# Patient Record
Sex: Female | Born: 1960 | Race: Black or African American | Hispanic: No | Smoking: Current every day smoker
Health system: Southern US, Community
[De-identification: ages and names within clinical notes are randomized; demographics above are authoritative.]

## PROBLEM LIST (undated history)

## (undated) DIAGNOSIS — G8190 Hemiplegia, unspecified affecting unspecified side: Secondary | ICD-10-CM

## (undated) DIAGNOSIS — F1490 Cocaine use, unspecified, uncomplicated: Secondary | ICD-10-CM

## (undated) DIAGNOSIS — I10 Essential (primary) hypertension: Secondary | ICD-10-CM

## (undated) DIAGNOSIS — E119 Type 2 diabetes mellitus without complications: Secondary | ICD-10-CM

## (undated) DIAGNOSIS — D696 Thrombocytopenia, unspecified: Secondary | ICD-10-CM

## (undated) DIAGNOSIS — Z720 Tobacco use: Secondary | ICD-10-CM

## (undated) DIAGNOSIS — I639 Cerebral infarction, unspecified: Secondary | ICD-10-CM

## (undated) DIAGNOSIS — I252 Old myocardial infarction: Secondary | ICD-10-CM

## (undated) DIAGNOSIS — I517 Cardiomegaly: Secondary | ICD-10-CM

## (undated) DIAGNOSIS — I82409 Acute embolism and thrombosis of unspecified deep veins of unspecified lower extremity: Secondary | ICD-10-CM

## (undated) HISTORY — PX: ABDOMINAL HYSTERECTOMY: SHX81

## (undated) HISTORY — PX: HERNIA REPAIR: SHX51

---

## 2001-12-15 ENCOUNTER — Emergency Department (HOSPITAL_COMMUNITY): Admission: EM | Admit: 2001-12-15 | Discharge: 2001-12-16 | Payer: Self-pay | Admitting: Emergency Medicine

## 2001-12-18 ENCOUNTER — Emergency Department (HOSPITAL_COMMUNITY): Admission: EM | Admit: 2001-12-18 | Discharge: 2001-12-18 | Payer: Self-pay | Admitting: Emergency Medicine

## 2002-01-08 ENCOUNTER — Other Ambulatory Visit: Admission: RE | Admit: 2002-01-08 | Discharge: 2002-01-08 | Payer: Self-pay | Admitting: Obstetrics and Gynecology

## 2002-01-20 ENCOUNTER — Encounter (HOSPITAL_COMMUNITY): Admission: RE | Admit: 2002-01-20 | Discharge: 2002-02-19 | Payer: Self-pay | Admitting: Oncology

## 2002-01-20 ENCOUNTER — Encounter: Admission: RE | Admit: 2002-01-20 | Discharge: 2002-01-20 | Payer: Self-pay | Admitting: Oncology

## 2002-01-21 ENCOUNTER — Ambulatory Visit (HOSPITAL_COMMUNITY): Admission: RE | Admit: 2002-01-21 | Discharge: 2002-01-21 | Payer: Self-pay | Admitting: Unknown Physician Specialty

## 2002-01-21 ENCOUNTER — Encounter: Payer: Self-pay | Admitting: Unknown Physician Specialty

## 2002-01-26 ENCOUNTER — Encounter: Payer: Self-pay | Admitting: Unknown Physician Specialty

## 2002-01-26 ENCOUNTER — Ambulatory Visit (HOSPITAL_COMMUNITY): Admission: RE | Admit: 2002-01-26 | Discharge: 2002-01-26 | Payer: Self-pay | Admitting: Unknown Physician Specialty

## 2002-02-17 ENCOUNTER — Other Ambulatory Visit: Admission: RE | Admit: 2002-02-17 | Discharge: 2002-02-17 | Payer: Self-pay | Admitting: Obstetrics & Gynecology

## 2002-03-08 ENCOUNTER — Inpatient Hospital Stay (HOSPITAL_COMMUNITY): Admission: RE | Admit: 2002-03-08 | Discharge: 2002-03-11 | Payer: Self-pay | Admitting: Obstetrics & Gynecology

## 2002-04-09 ENCOUNTER — Encounter: Admission: RE | Admit: 2002-04-09 | Discharge: 2002-04-09 | Payer: Self-pay | Admitting: Oncology

## 2002-07-23 ENCOUNTER — Encounter (HOSPITAL_COMMUNITY): Admission: RE | Admit: 2002-07-23 | Discharge: 2002-08-22 | Payer: Self-pay | Admitting: Oncology

## 2002-07-23 ENCOUNTER — Encounter: Admission: RE | Admit: 2002-07-23 | Discharge: 2002-07-23 | Payer: Self-pay | Admitting: Oncology

## 2004-04-24 ENCOUNTER — Emergency Department (HOSPITAL_COMMUNITY): Admission: EM | Admit: 2004-04-24 | Discharge: 2004-04-24 | Payer: Self-pay | Admitting: Emergency Medicine

## 2005-04-08 ENCOUNTER — Emergency Department (HOSPITAL_COMMUNITY): Admission: EM | Admit: 2005-04-08 | Discharge: 2005-04-08 | Payer: Self-pay | Admitting: Emergency Medicine

## 2005-06-19 ENCOUNTER — Emergency Department (HOSPITAL_COMMUNITY): Admission: EM | Admit: 2005-06-19 | Discharge: 2005-06-19 | Payer: Self-pay | Admitting: Emergency Medicine

## 2006-04-16 ENCOUNTER — Emergency Department (HOSPITAL_COMMUNITY): Admission: EM | Admit: 2006-04-16 | Discharge: 2006-04-17 | Payer: Self-pay | Admitting: Emergency Medicine

## 2007-05-08 ENCOUNTER — Emergency Department (HOSPITAL_COMMUNITY): Admission: EM | Admit: 2007-05-08 | Discharge: 2007-05-09 | Payer: Self-pay | Admitting: Emergency Medicine

## 2008-03-18 ENCOUNTER — Emergency Department (HOSPITAL_COMMUNITY): Admission: EM | Admit: 2008-03-18 | Discharge: 2008-03-18 | Payer: Self-pay | Admitting: Emergency Medicine

## 2011-01-20 ENCOUNTER — Encounter: Payer: Self-pay | Admitting: Internal Medicine

## 2011-05-17 NOTE — Discharge Summary (Signed)
[facility]  Patient:    Tracy [name], Tracy [name] Visit Number: [id number] MRN: [id number]          Service Type: SUR Location: 4A A427 01 Attending Physician:  Florian Buff Dictated by:   Darlis Loan, M.D. Admit Date:  03/08/2002 Discharge Date: 03/11/2002                             Discharge Summary  DISCHARGE DIAGNOSES: 1. Status post an abdominal hysterectomy. 2. Enlarged fibroid uterus. 3. Severe anemia. 4. Pancytopenia.  PROCEDURES: 1. Abdominal hysterectomy. 2. Transfusion of 2 units of packed red blood cells. 3. Hematology consult with Dr. Tressie Stalker.  HISTORY OF PRESENT ILLNESS:  Please refer to the transcribed history and physical for details of admission to the hospital.  HOSPITAL COURSE:  Patient was admitted and received 2 units of packed red blood cells in preparation for abdominal hysterectomy on the 11th.  The surgery went well, no complications.  Blood loss 200 cc.  She was still significantly thrombocytopenic and also had a white count of 3900 preoperatively.  As a result, got a consult with Dr. Tressie Stalker and he is going to follow her up in the office.  Postoperatively the patient has done great. She has ambulated without difficulty.  She has tolerated her low fiber, low residue, regular diet.  She is passing gas, tolerating oral pain medicine, and has been downstairs to smoke cigarettes three times.  She had a hemoglobin and hematocrit on postoperative day #1 of 9.4 and 28.4 and she was discharged to home on the morning of postoperative day #2 in good and stable condition to follow up in the office next Wednesday to have her staples removed and Steri-Strips placed.  She is given after care instructions and instructions for contacting our office if she has any problems.  She is scheduled to see Dr. Tressie Stalker in his office in three weeks. Dictated by:   Darlis Loan, M.D. Attending Physician:  Florian Buff DD:  03/11/02 TD:   03/12/02 Job: [id number]

## 2011-05-17 NOTE — Procedures (Signed)
[name], [name]         ACCOUNT NO.:  [id number]   MEDICAL RECORD NO.:  [id number]          PATIENT TYPE:  EMS   LOCATION:  ED                            FACILITY:  [facility]   PHYSICIAN:  Edward L. Luan Pulling, M.D.DATE OF BIRTH:  [birth year]   DATE OF PROCEDURE:  04/16/2006  DATE OF DISCHARGE:  04/17/2006                                EKG INTERPRETATION   TIME:  2223 hours  DATE:  April 16, 2006   The rhythm is supraventricular tachycardia with a ventricular response in  the 160s.  There is left axis deviation.  Q-waves are seen in V1 and V2  which may indicate a previous anteroseptal infarction.  Abnormal  electrocardiogram.      Jasper Loser. Luan Pulling, M.D.  Electronically Signed     ELH/MEDQ  D:  04/17/2006  T:  04/17/2006  Job:  [id number]

## 2011-05-17 NOTE — Procedures (Signed)
Tracy [name], Tracy [name]         ACCOUNT NO.:  [id number]   MEDICAL RECORD NO.:  [id number]          PATIENT TYPE:  EMS   LOCATION:  ED                            FACILITY:  [facility]   PHYSICIAN:  Edward L. Luan Pulling, M.D.DATE OF BIRTH:  10-Mar-1961   DATE OF PROCEDURE:  04/16/2006  DATE OF DISCHARGE:  04/17/2006                                EKG INTERPRETATION   TIME:  2223 hours  DATE:  April 16, 2006   The rhythm is supraventricular tachycardia with a rate of about 165.  There  is left axis deviation and some possible previous anteroseptal myocardial  infarction.  Abnormal electrocardiogram.      Jasper Loser. Luan Pulling, M.D.  Electronically Signed     ELH/MEDQ  D:  04/17/2006  T:  04/17/2006  Job:  [id number]

## 2011-05-17 NOTE — Op Note (Signed)
[facility]  Patient:    Tracy [name], Tracy [name] Visit Number: [id number] MRN: [id number]          Service Type: SUR Location: 4A A427 01 Attending Physician:  Florian Buff Dictated by:   Tania Ade, M.D. Admit Date:  03/08/2002                             Operative Report  PREOPERATIVE DIAGNOSES:  1. Enlarged fibroid uterus.  2. Menometrorrhagia. 3. Severe anemia.  4. Pancytopenia.  POSTOPERATIVE DIAGNOSIS:  1. Enlarged fibroid uterus.  2. Menometrorrhagia. 3. Severe anemia.  4. Pancytopenia.  OPERATION/PROCEDURE:  Abdominal hysterectomy, [id number]  SURGEON:  Tania Ade, M.D.  ASSISTANT:  Mallory Shirk, M.D.  ANESTHESIA:  General endotracheal anesthesia.  FINDINGS:  The patient had an enlarged fibroid uterus, approximately I would say 16 weeks size, multiple myelomas seen.  There were some filmy adhesions of both ovaries to the back wall of the uterus.  She had a follicular cyst of the right ovary.  The upper abdomen was evaluated and she had a normal looking liver and it felt normal.  Both kidneys were palpated, and she did not have a palpably enlarged spleen.  There were no other abnormalities seen.  DESCRIPTION OF PROCEDURE:  The patient received 1 gram of Ancef prior to surgery and she had flowtrons in place and working.  She had a platelet count of 68,000 last night, and I have her 2 units of blood and her H&H has come up to 10 and 29. We wanted to check another platelet this morning and it was 60,000.  As a result we proceeded with surgery.  The patient was taken to the operating room and placed in the supine position where she underwent general endotracheal anesthesia.  She was prepped and draped in the usual sterile fashion, including the vagina, and a Foley catheter was placed.  A Pfannenstiel skin incision was made and carried down sharply to the rectus fascia which was scored in the midline and extended laterally.  The fascia was  taken off the muscles both superiorly and inferiorly without difficulty.  The muscles were divided and the peritoneal cavity was entered.  The uterus was liberated from the pelvis and a retractor was not used.  The left round ligament was isolated, suture ligated, and cut with electrocautery unit and the vesicouterine serosal flap was created on the left.  The right round ligament was suture ligated and cut, and the vesicouterine serosal flap on the right was created.  The utero-ovarian ligament on the right was isolated and an avascular window made; and clamped, cut, and suture ligated.  The utero-ovarian ligament on the left was isolated and an avascular window made.  It was clamped, cut, and double-suture ligated.  The uterine vessels were skeletonized on the left and the bladder was pushed off the lower uterine segment without difficulty.  The left uterine vessels were clamped, cut, and suture ligated.  The right uterine vessels were then skeletonized and clamped, cut, and suture ligated.  Serial pedicles were taken down the cervix, through the cardinal ligament with each pedicle being clamped, cut, transfixed, and suture ligated and cut.  We then got to the end of the cervix and the vagina was incised.  The cervix was pulled up through the vaginal incision; and the specimen was removed sharply with scissors.  Vaginal angle sutures were then placed and the cuff was closed in anterior-to-posterior  fashion with interrupted sutures.  All sutures used, at this point, was #0 Vicryl.  There was some bleeding of the left utero-ovarian pedicle and this pedicle was reinforced with figure-of-eight suture.  Both ovaries were then attached to the round ligaments to get them away from the vagina so that they would not become adherent and cause dyspareunia later.  Vigorous pelvic irrigation was performed and small peritoneal edge bleeders were taken care of with electrocautery unit.  The upper  abdomen was inspected, as I stated, mostly because of the patients pancytopenia.  The liver was normal.  Gallbladder was normal.  Both kidneys palpated normal.  Bowels are normal and the spleen palpated normal.  Again, hemostasis was confirmed and the vaginal angle suture were tied together in the midline.  The rectus muscles were reapproximated loosely.  The fascia was closed using #0 Vicryl running.  The subcutaneous tissue was made hemostatic and irrigated.  The skin was closed using skin staples.  An Alpha 200 pain pump 2 cc/h of 1/2% Marcaine was placed.  The patient tolerated the procedure well.  She experienced 200 cc of blood loss.  She was taken to the recovery room in good stable condition.  All needle, sponge, and instrument counts were correct x3.  She had 650 cc of clear urine output and 2400 cc of fluid intraoperatively.  After surgery, I called Dr. Tressie Stalker due to her pancytopenia and consulted him regarding that. Dictated by:   Tania Ade, M.D. Attending Physician:  Florian Buff DD:  03/09/02 TD:  03/09/02 Job: [id number]

## 2011-05-17 NOTE — Procedures (Signed)
[name], [name]         ACCOUNT NO.:  [id number]   MEDICAL RECORD NO.:  [id number]          PATIENT TYPE:  EMS   LOCATION:  ED                            FACILITY:  [facility]   PHYSICIAN:  Edward L. Luan Pulling, M.D.DATE OF BIRTH:  September 29, 1961   DATE OF PROCEDURE:  04/16/2006  DATE OF DISCHARGE:  04/17/2006                                EKG INTERPRETATION   TIME:  2240 hours  DATE:  April 16, 2006   The rhythm is a supraventricular tachycardia but I cannot tell if there are  P-waves, it is listed as being EKG #2 post adenosine, the heart rate has  slowed somewhat to 140, the axis is leftward, there are Q-waves in V1 and V2  suggesting previous infarction, QT interval is now prolonged.  Abnormal  electrocardiogram.      Jasper Loser. Luan Pulling, M.D.  Electronically Signed     ELH/MEDQ  D:  04/17/2006  T:  04/17/2006  Job:  [id number]

## 2011-09-23 LAB — DIFFERENTIAL
Basophils Relative: 0
Lymphocytes Relative: 23
Lymphs Abs: 1.4
Monocytes Absolute: 0.3
Monocytes Relative: 6
Neutro Abs: 4.1
Neutrophils Relative %: 70

## 2011-09-23 LAB — CBC
Hemoglobin: 15.1 — ABNORMAL HIGH
MCHC: 35.2
RBC: 4.64
WBC: 5.8

## 2011-09-23 LAB — BASIC METABOLIC PANEL
CO2: 26
Calcium: 8.8
GFR calc Af Amer: >60
Sodium: 138

## 2012-04-19 ENCOUNTER — Encounter (HOSPITAL_COMMUNITY): Payer: Self-pay

## 2012-04-19 ENCOUNTER — Emergency Department (HOSPITAL_COMMUNITY)
Admission: EM | Admit: 2012-04-19 | Discharge: 2012-04-19 | Disposition: A | Discharge status: Home / Self Care | Payer: Self-pay | Attending: Emergency Medicine | Admitting: Emergency Medicine

## 2012-04-19 DIAGNOSIS — I10 Essential (primary) hypertension: Secondary | ICD-10-CM | POA: Insufficient documentation

## 2012-04-19 DIAGNOSIS — Z760 Encounter for issue of repeat prescription: Secondary | ICD-10-CM | POA: Insufficient documentation

## 2012-04-19 DIAGNOSIS — H60399 Other infective otitis externa, unspecified ear: Secondary | ICD-10-CM | POA: Insufficient documentation

## 2012-04-19 DIAGNOSIS — L0291 Cutaneous abscess, unspecified: Secondary | ICD-10-CM

## 2012-04-19 DIAGNOSIS — I252 Old myocardial infarction: Secondary | ICD-10-CM | POA: Insufficient documentation

## 2012-04-19 HISTORY — DX: Essential (primary) hypertension: I10

## 2012-04-19 HISTORY — DX: Old myocardial infarction: I25.2

## 2012-04-19 MED ORDER — HYDROCODONE-ACETAMINOPHEN 5-325 MG PO TABS
1.0000 | ORAL_TABLET | Freq: Once | ORAL | Status: AC
  Administered 2012-04-19: 1 via ORAL
  Filled 2012-04-19: qty 1

## 2012-04-19 MED ORDER — LIDOCAINE HCL (PF) 2 % IJ SOLN
INTRAMUSCULAR | Status: AC
  Administered 2012-04-19: 10:00:00
  Filled 2012-04-19: qty 10

## 2012-04-19 MED ORDER — HYDROCHLOROTHIAZIDE 25 MG PO TABS: 25.0000 mg | ORAL_TABLET | Freq: Every day | ORAL | Status: DC

## 2012-04-19 MED ORDER — SULFAMETHOXAZOLE-TRIMETHOPRIM 800-160 MG PO TABS: 1.0000 | ORAL_TABLET | Freq: Two times a day (BID) | ORAL | Status: AC

## 2012-04-19 MED ORDER — HYDROCODONE-ACETAMINOPHEN 5-325 MG PO TABS: 1.0000 | ORAL_TABLET | ORAL | Status: AC | PRN

## 2012-04-19 MED ORDER — METOPROLOL SUCCINATE ER 100 MG PO TB24: 100.0000 mg | ORAL_TABLET | Freq: Every day | ORAL | Status: DC

## 2012-04-19 NOTE — ED Notes (Signed)
EDP is in with the pt at this time

## 2012-04-19 NOTE — ED Notes (Signed)
?  abcess behind right ear x1 week

## 2012-04-19 NOTE — ED Notes (Signed)
Has been on 3 bp meds in the past, but has been out "for a long while"

## 2012-04-19 NOTE — ED Notes (Signed)
Pt states she is in more pain now than before. eDP aware.

## 2012-04-19 NOTE — Discharge Instructions (Signed)
Return to the ER in two days to have the packing removed. Take all of the antibiotic. Use the pain medicine as needed. Take the blood pressure medicine. Follow up at the [facility]   Abscess Care After An abscess (also called a boil or furuncle) is an infected area that contains a collection of pus. Signs and symptoms of an abscess include pain, tenderness, redness, or hardness, or you may feel a moveable soft area under your skin. An abscess can occur anywhere in the body. The infection may spread to surrounding tissues causing cellulitis. A cut (incision) by the surgeon was made over your abscess and the pus was drained out. Gauze may have been packed into the space to provide a drain that will allow the cavity to heal from the inside outwards. The boil may be painful for 5 to 7 days. Most people with a boil do not have high fevers. Your abscess, if seen early, may not have localized, and may not have been lanced. If not, another appointment may be required for this if it does not get better on its own or with medications. HOME CARE INSTRUCTIONS   Only take over-the-counter or prescription medicines for pain, discomfort, or fever as directed by your caregiver.   When you bathe, soak and then remove gauze or iodoform packs at least daily or as directed by your caregiver. You may then wash the wound gently with mild soapy water. Repack with gauze or do as your caregiver directs.  SEEK IMMEDIATE MEDICAL CARE IF:   You develop increased pain, swelling, redness, drainage, or bleeding in the wound site.   You develop signs of generalized infection including muscle aches, chills, fever, or a general ill feeling.   An oral temperature above 102 F (38.9 C) develops, not controlled by medication.  See your caregiver for a recheck if you develop any of the symptoms described above. If medications (antibiotics) were prescribed, take them as directed. Document Released: 07/04/2005 Document Revised:  12/05/2011 Document Reviewed: 02/29/2008 [facility] Patient Information 2012 [place].

## 2012-04-19 NOTE — ED Provider Notes (Signed)
History   This chart was scribed for [employer]. Olin Hauser, MD by Carolyne Littles. The patient was seen in room APA07/APA07. Patient's care was started at 0816.    CSN: [id number]  Arrival date & time 04/19/12  [id number]   First MD Initiated Contact with Patient 04/19/12 [phone]      Chief Complaint  Patient presents with  . Wound Check  . Medication Refill    (Consider location/radiation/quality/duration/timing/severity/associated sxs/prior treatment) HPI Tracy [name] is a 51 y.o. female who presents to the Emergency Department complaining of a moderate abscess behind the right ear onset 1 week ago and gradually worsening since with associated throbbing pain to region. Denies fever, chills, nausea, vomiting, HA. Patient with h/o HTN, MI and is a current everyday smoker.     Past Medical History  Diagnosis Date  . Hypertension   . Myocardial infarct, old     Past Surgical History  Procedure Date  . Abdominal hysterectomy     No family history on file.  History  Substance Use Topics  . Smoking status: Current Everyday Smoker    Types: Cigarettes  . Smokeless tobacco: Not on file  . Alcohol Use: No    OB History    Grav Para Term Preterm Abortions TAB SAB Ect Mult Living                  Review of Systems A complete 10 system review of systems was obtained and all systems are negative except as noted in the HPI and PMH.   Allergies  Review of patient's allergies indicates no known allergies.  Home Medications  No current outpatient prescriptions on file.  BP 167/113  Pulse 82  Temp(Src) 98.9 F (37.2 C) (Oral)  Resp 20  Ht 5\' 7"  (1.702 m)  Wt 216 lb 1 oz (98.005 kg)  BMI 33.84 kg/m2  SpO2 96%  Physical Exam  Nursing note and vitals reviewed. Constitutional: She is oriented to person, place, and time. She appears well-developed and well-nourished. No distress.  HENT:  Head: Normocephalic and atraumatic.       2cm abscess overlying right mastoid process  with obvious pus and fluctuance. 65mm abscess noted below right ear with an abraded top and no fluctuance.  Eyes: EOM are normal. Pupils are equal, round, and reactive to light.  Neck: Neck supple. No tracheal deviation present.  Cardiovascular: An irregular rhythm present. Bradycardia present.  Exam reveals no gallop and no friction rub.   No murmur heard.      Regular rhythm with frequent ectopy.   Pulmonary/Chest: Effort normal. No respiratory distress.  Abdominal: Soft. She exhibits no distension.  Musculoskeletal: Normal range of motion. She exhibits no edema.  Lymphadenopathy:    She has cervical adenopathy (shoddy).  Neurological: She is alert and oriented to person, place, and time. No sensory deficit.  Skin: Skin is warm and dry.  Psychiatric: She has a normal mood and affect. Her behavior is normal.    ED Course  Procedures   INCISION AND DRAINAGE Performed by: NEESE,HOPE Consent: Verbal consent obtained. Risks and benefits: risks, benefits and alternatives were discussed Type: abscess  Body area: post auricular Cleaned with betadine  Anesthesia: local infiltration  Local anesthetic: lidocaine 1%  Anesthetic total: 1 ml  Complexity: complex Incision and drainage using #11 blade  Blunt dissection to break up loculations  Drainage: purulent  Drainage amount: moderate Irrigated with normal saline solution Packing material: 1/4 in iodoform gauze  Patient tolerance: Patient tolerated  the procedure well with no immediate complications.   DIAGNOSTIC STUDIES: Oxygen Saturation is 96% on room air, adequate by my interpretation.    COORDINATION OF CARE: 8:51AM-Patient informed of current plan for treatment and evaluation and agrees with plan at this time.  10:39AM- Patient reports she is feeling better at this time following I and D procedure. Patient explained need for care of HTN. Will d/c home. Patient agrees.       MDM  Patient with abscess behind right  ear and below right ear. I&D completed by midlevel. Initiated antibiotic therapy.Patient with hypertension, out of medication for some time. Will provide Rx for her antihypertensives.Pt stable in ED with no significant deterioration in condition.The patient appears reasonably screened and/or stabilized for discharge and I doubt any other medical condition or other [facility] requiring further screening, evaluation, or treatment in the ED at this time prior to discharge.  I personally performed the services described in this documentation, which was scribed in my presence. The recorded information has been reviewed and considered.   MDM Reviewed: nursing note and vitals           Gypsy Balsam. Olin Hauser, MD 04/19/12 1106

## 2012-04-21 ENCOUNTER — Emergency Department (HOSPITAL_COMMUNITY)
Admission: EM | Admit: 2012-04-21 | Discharge: 2012-04-21 | Disposition: A | Discharge status: Home / Self Care | Payer: Self-pay | Attending: Emergency Medicine | Admitting: Emergency Medicine

## 2012-04-21 ENCOUNTER — Encounter (HOSPITAL_COMMUNITY): Payer: Self-pay | Admitting: *Deleted

## 2012-04-21 DIAGNOSIS — K047 Periapical abscess without sinus: Secondary | ICD-10-CM | POA: Insufficient documentation

## 2012-04-21 DIAGNOSIS — I252 Old myocardial infarction: Secondary | ICD-10-CM | POA: Insufficient documentation

## 2012-04-21 DIAGNOSIS — F17200 Nicotine dependence, unspecified, uncomplicated: Secondary | ICD-10-CM | POA: Insufficient documentation

## 2012-04-21 DIAGNOSIS — L0291 Cutaneous abscess, unspecified: Secondary | ICD-10-CM

## 2012-04-21 DIAGNOSIS — I10 Essential (primary) hypertension: Secondary | ICD-10-CM | POA: Insufficient documentation

## 2012-04-21 MED ORDER — OXYCODONE-ACETAMINOPHEN 5-325 MG PO TABS: 1.0000 | ORAL_TABLET | ORAL | Status: AC | PRN

## 2012-04-21 NOTE — ED Provider Notes (Signed)
History     CSN: [id number]  Arrival date & time 04/21/12  1544   First MD Initiated Contact with Patient 04/21/12 1613      Chief Complaint  Patient presents with  . Wound Check    (Consider location/radiation/quality/duration/timing/severity/associated sxs/prior treatment) HPI Comments: Patient presents for evaluation of an abscess she had a pain deep 2 days ago behind her right ear at her scalp line.  She is without complaint today, she does have soreness at the site but is improved since it has been drained.  She changed the dressing for the first time this morning and there was moderate drainage but none since.  She denies fevers or chills, no surrounding redness or swelling.  Patient is a 51 y.o. female presenting with wound check.  Wound Check     Past Medical History  Diagnosis Date  . Hypertension   . Myocardial infarct, old     Past Surgical History  Procedure Date  . Abdominal hysterectomy     History reviewed. No pertinent family history.  History  Substance Use Topics  . Smoking status: Current Everyday Smoker    Types: Cigarettes  . Smokeless tobacco: Not on file  . Alcohol Use: No    OB History    Grav Para Term Preterm Abortions TAB SAB Ect Mult Living                  Review of Systems  Skin: Positive for wound.  All other systems reviewed and are negative.    Allergies  Review of patient's allergies indicates no known allergies.  Home Medications   Current Outpatient Rx  Name Route Sig Dispense Refill  . HYDROCHLOROTHIAZIDE 25 MG PO TABS Oral Take 1 tablet (25 mg total) by mouth daily. 30 tablet 0  . HYDROCODONE-ACETAMINOPHEN 5-325 MG PO TABS Oral Take 1 tablet by mouth every 4 (four) hours as needed for pain. 15 tablet 0  . SULFAMETHOXAZOLE-TRIMETHOPRIM 800-160 MG PO TABS Oral Take 1 tablet by mouth 2 (two) times daily. 28 tablet 0  . METOPROLOL SUCCINATE ER 100 MG PO TB24 Oral Take 1 tablet (100 mg total) by mouth daily. 30 tablet 0     BP 166/100  Pulse 84  Temp(Src) 97.7 F (36.5 C) (Oral)  Resp 16  Ht 5\' 6"  (1.676 m)  Wt 216 lb 1 oz (98.005 kg)  BMI 34.87 kg/m2  SpO2 97%  Physical Exam  Constitutional: She is oriented to person, place, and time. She appears well-developed and well-nourished. No distress.  HENT:  Head: Normocephalic and atraumatic.  Right Ear: External ear normal.  Left Ear: External ear normal.  Mouth/Throat: Oropharynx is clear and moist and mucous membranes are normal. No oral lesions. Dental abscesses present.  Eyes: Conjunctivae are normal.  Neck: Normal range of motion. Neck supple.  Cardiovascular: Normal rate and normal heart sounds.   Pulmonary/Chest: Effort normal.  Abdominal: She exhibits no distension.  Musculoskeletal: Normal range of motion.  Lymphadenopathy:    She has cervical adenopathy.       Right cervical: Superficial cervical adenopathy present.  Neurological: She is alert and oriented to person, place, and time.  Skin: Skin is warm and dry. No erythema.       2 cm abscess at right mastoid process with a small amount of purulence within the abscess site.  The area was flushed clean revealing a healthy granulation tissue formation.  Area was not repacked.  Bandage was applied.  Psychiatric: She has a  normal mood and affect.    ED Course  Procedures (including critical care time)  Labs Reviewed - No data to display No results found.   1. Abscess       MDM  Patient encouraged to start warm shower water therapy to the areas daily.  Complete antibiotic course.  Return when necessary if she has any problems or concerns.        Fulton Reek, Victoria 04/21/12 1720

## 2012-04-21 NOTE — Discharge Instructions (Signed)
Abscess An abscess (boil or furuncle) is an infected area that contains a collection of pus.  SYMPTOMS Signs and symptoms of an abscess include pain, tenderness, redness, or hardness. You may feel a moveable soft area under your skin. An abscess can occur anywhere in the body.  TREATMENT  A surgical cut (incision) may be made over your abscess to drain the pus. Gauze may be packed into the space or a drain may be looped through the abscess cavity (pocket). This provides a drain that will allow the cavity to heal from the inside outwards. The abscess may be painful for a few days, but should feel much better if it was drained.  Your abscess, if seen early, may not have localized and may not have been drained. If not, another appointment may be required if it does not get better on its own or with medications. HOME CARE INSTRUCTIONS   Only take over-the-counter or prescription medicines for pain, discomfort, or fever as directed by your caregiver.   Take your antibiotics as directed if they were prescribed. Finish them even if you start to feel better.   Keep the skin and clothes clean around your abscess.   If the abscess was drained, you will need to use gauze dressing to collect any draining pus. Dressings will typically need to be changed 3 or more times a day.   The infection may spread by skin contact with others. Avoid skin contact as much as possible.   Practice good hygiene. This includes regular hand washing, cover any draining skin lesions, and do not share personal care items.   If you participate in sports, do not share athletic equipment, towels, whirlpools, or personal care items. Shower after every practice or tournament.   If a draining area cannot be adequately covered:   Do not participate in sports.   Children should not participate in day care until the wound has healed or drainage stops.   If your caregiver has given you a follow-up appointment, it is very important  to keep that appointment. Not keeping the appointment could result in a much worse infection, chronic or permanent injury, pain, and disability. If there is any problem keeping the appointment, you must call back to this facility for assistance.  SEEK MEDICAL CARE IF:   You develop increased pain, swelling, redness, drainage, or bleeding in the wound site.   You develop signs of generalized infection including muscle aches, chills, fever, or a general ill feeling.   You have an oral temperature above 102 F (38.9 C).  MAKE SURE YOU:   Understand these instructions.   Will watch your condition.   Will get help right away if you are not doing well or get worse.  Document Released: 09/25/2005 Document Revised: 12/05/2011 Document Reviewed: 07/19/2008 [facility] Patient Information 2012 [place].   Finish your antibiotics.  Start doing a gentle water therapy twice daily by allowing your shower water to massage this area for 5 minutes twice daily.  Keep covered until it stops draining and it is sealed over.

## 2012-04-21 NOTE — ED Notes (Signed)
Pt needs wound check to right side of her head. Had I&D done on Sunday and has packing in place.

## 2012-04-25 NOTE — ED Provider Notes (Signed)
Medical screening examination/treatment/procedure(s) were performed by non-physician practitioner and as supervising physician I was immediately available for consultation/collaboration.   Mervin Kung, MD 04/25/12 [phone]

## 2012-07-03 ENCOUNTER — Inpatient Hospital Stay (HOSPITAL_COMMUNITY): Payer: Medicaid Other

## 2012-07-03 ENCOUNTER — Encounter (HOSPITAL_COMMUNITY): Payer: Self-pay | Admitting: *Deleted

## 2012-07-03 ENCOUNTER — Emergency Department (HOSPITAL_COMMUNITY): Payer: Medicaid Other

## 2012-07-03 ENCOUNTER — Inpatient Hospital Stay (HOSPITAL_COMMUNITY)
Admission: EM | Admit: 2012-07-03 | Discharge: 2012-07-04 | DRG: 065 | Disposition: A | Discharge status: Home Health | Payer: Medicaid Other | Attending: Internal Medicine | Admitting: Internal Medicine

## 2012-07-03 DIAGNOSIS — D6959 Other secondary thrombocytopenia: Secondary | ICD-10-CM | POA: Diagnosis present

## 2012-07-03 DIAGNOSIS — F1490 Cocaine use, unspecified, uncomplicated: Secondary | ICD-10-CM | POA: Diagnosis present

## 2012-07-03 DIAGNOSIS — Z91199 Patient's noncompliance with other medical treatment and regimen due to unspecified reason: Secondary | ICD-10-CM

## 2012-07-03 DIAGNOSIS — Z720 Tobacco use: Secondary | ICD-10-CM | POA: Diagnosis present

## 2012-07-03 DIAGNOSIS — F17200 Nicotine dependence, unspecified, uncomplicated: Secondary | ICD-10-CM | POA: Diagnosis present

## 2012-07-03 DIAGNOSIS — Z90710 Acquired absence of both cervix and uterus: Secondary | ICD-10-CM

## 2012-07-03 DIAGNOSIS — I160 Hypertensive urgency: Secondary | ICD-10-CM | POA: Diagnosis present

## 2012-07-03 DIAGNOSIS — Z823 Family history of stroke: Secondary | ICD-10-CM

## 2012-07-03 DIAGNOSIS — D696 Thrombocytopenia, unspecified: Secondary | ICD-10-CM | POA: Diagnosis present

## 2012-07-03 DIAGNOSIS — I252 Old myocardial infarction: Secondary | ICD-10-CM

## 2012-07-03 DIAGNOSIS — I639 Cerebral infarction, unspecified: Secondary | ICD-10-CM | POA: Diagnosis present

## 2012-07-03 DIAGNOSIS — D751 Secondary polycythemia: Secondary | ICD-10-CM | POA: Diagnosis present

## 2012-07-03 DIAGNOSIS — I517 Cardiomegaly: Secondary | ICD-10-CM | POA: Diagnosis present

## 2012-07-03 DIAGNOSIS — Z8673 Personal history of transient ischemic attack (TIA), and cerebral infarction without residual deficits: Secondary | ICD-10-CM

## 2012-07-03 DIAGNOSIS — F1410 Cocaine abuse, uncomplicated: Secondary | ICD-10-CM

## 2012-07-03 DIAGNOSIS — F40298 Other specified phobia: Secondary | ICD-10-CM | POA: Diagnosis present

## 2012-07-03 DIAGNOSIS — G8190 Hemiplegia, unspecified affecting unspecified side: Secondary | ICD-10-CM | POA: Diagnosis present

## 2012-07-03 DIAGNOSIS — I10 Essential (primary) hypertension: Secondary | ICD-10-CM

## 2012-07-03 DIAGNOSIS — Z9119 Patient's noncompliance with other medical treatment and regimen: Secondary | ICD-10-CM

## 2012-07-03 DIAGNOSIS — I6350 Cerebral infarction due to unspecified occlusion or stenosis of unspecified cerebral artery: Principal | ICD-10-CM

## 2012-07-03 HISTORY — DX: Tobacco use: Z72.0

## 2012-07-03 HISTORY — DX: Cerebral infarction, unspecified: I63.9

## 2012-07-03 HISTORY — DX: Cocaine use, unspecified, uncomplicated: F14.90

## 2012-07-03 HISTORY — DX: Thrombocytopenia, unspecified: D69.6

## 2012-07-03 HISTORY — DX: Hemiplegia, unspecified affecting unspecified side: G81.90

## 2012-07-03 HISTORY — DX: Cardiomegaly: I51.7

## 2012-07-03 LAB — RAPID URINE DRUG SCREEN, HOSP PERFORMED
Amphetamines: NOT DETECTED
Barbiturates: NOT DETECTED
Cocaine: POSITIVE — AB
Opiates: NOT DETECTED
Tetrahydrocannabinol: NOT DETECTED

## 2012-07-03 LAB — CBC
HCT: 46.6 % — ABNORMAL HIGH (ref 36.0–46.0)
Hemoglobin: 16.2 g/dL — ABNORMAL HIGH (ref 12.0–15.0)
RBC: 5.15 MIL/uL — ABNORMAL HIGH (ref 3.87–5.11)
WBC: 7 10*3/uL (ref 4.0–10.5)

## 2012-07-03 LAB — PROTIME-INR: INR: 1.03 (ref 0.00–1.49)

## 2012-07-03 LAB — DIFFERENTIAL
Basophils Relative: 0 % (ref 0–1)
Lymphocytes Relative: 31 % (ref 12–46)
Lymphs Abs: 2.2 10*3/uL (ref 0.7–4.0)
Monocytes Absolute: 0.3 10*3/uL (ref 0.1–1.0)
Monocytes Relative: 4 % (ref 3–12)
Neutro Abs: 4.5 10*3/uL (ref 1.7–7.7)
Neutrophils Relative %: 64 % (ref 43–77)

## 2012-07-03 LAB — CK TOTAL AND CKMB (NOT AT ARMC)
CK, MB: 2.8 ng/mL (ref 0.3–4.0)
Total CK: 117 U/L (ref 7–177)

## 2012-07-03 LAB — COMPREHENSIVE METABOLIC PANEL
Albumin: 3.5 g/dL (ref 3.5–5.2)
Alkaline Phosphatase: 107 U/L (ref 39–117)
BUN: 13 mg/dL (ref 6–23)
CO2: 25 mEq/L (ref 19–32)
Chloride: 106 mEq/L (ref 96–112)
Creatinine, Ser: 0.99 mg/dL (ref 0.50–1.10)
GFR calc non Af Amer: 65 mL/min — ABNORMAL LOW (ref >90)
Potassium: 3.6 mEq/L (ref 3.5–5.1)
Total Bilirubin: 0.5 mg/dL (ref 0.3–1.2)

## 2012-07-03 LAB — GLUCOSE, CAPILLARY: Glucose-Capillary: 108 mg/dL — ABNORMAL HIGH (ref 70–99)

## 2012-07-03 LAB — APTT: aPTT: 26 seconds (ref 24–37)

## 2012-07-03 MED ORDER — SENNOSIDES-DOCUSATE SODIUM 8.6-50 MG PO TABS: 1.0000 | ORAL_TABLET | Freq: Every evening | ORAL | Status: DC | PRN

## 2012-07-03 MED ORDER — ASPIRIN 325 MG PO TABS
325.0000 mg | ORAL_TABLET | Freq: Every day | ORAL | Status: DC
  Administered 2012-07-04: 325 mg via ORAL
  Filled 2012-07-03: qty 1

## 2012-07-03 MED ORDER — NICOTINE 21 MG/24HR TD PT24
21.0000 mg | MEDICATED_PATCH | Freq: Every day | TRANSDERMAL | Status: DC
  Administered 2012-07-03 – 2012-07-04 (×2): 21 mg via TRANSDERMAL
  Filled 2012-07-03 (×2): qty 1

## 2012-07-03 MED ORDER — ASPIRIN 81 MG PO CHEW
324.0000 mg | CHEWABLE_TABLET | Freq: Once | ORAL | Status: DC
  Filled 2012-07-03 (×2): qty 4

## 2012-07-03 MED ORDER — HYDROMORPHONE HCL PF 1 MG/ML IJ SOLN
1.0000 mg | Freq: Once | INTRAMUSCULAR | Status: AC
  Administered 2012-07-03: 1 mg via INTRAVENOUS
  Filled 2012-07-03: qty 1

## 2012-07-03 MED ORDER — ACETAMINOPHEN 325 MG PO TABS
650.0000 mg | ORAL_TABLET | Freq: Four times a day (QID) | ORAL | Status: DC | PRN
  Administered 2012-07-03 – 2012-07-04 (×2): 650 mg via ORAL
  Filled 2012-07-03 (×3): qty 2

## 2012-07-03 MED ORDER — VITAMIN B-1 100 MG PO TABS
100.0000 mg | ORAL_TABLET | Freq: Every day | ORAL | Status: DC
  Administered 2012-07-03 – 2012-07-04 (×2): 100 mg via ORAL
  Filled 2012-07-03 (×2): qty 1

## 2012-07-03 MED ORDER — LABETALOL HCL 5 MG/ML IV SOLN
10.0000 mg | INTRAVENOUS | Status: DC | PRN
  Administered 2012-07-03: 10 mg via INTRAVENOUS
  Filled 2012-07-03: qty 4

## 2012-07-03 MED ORDER — SODIUM CHLORIDE 0.9 % IJ SOLN
INTRAMUSCULAR | Status: AC
  Administered 2012-07-03: 10 mL
  Filled 2012-07-03: qty 3

## 2012-07-03 MED ORDER — HYDROCODONE-ACETAMINOPHEN 5-325 MG PO TABS
1.0000 | ORAL_TABLET | Freq: Four times a day (QID) | ORAL | Status: DC | PRN
  Administered 2012-07-03 – 2012-07-04 (×3): 1 via ORAL
  Filled 2012-07-03 (×3): qty 1

## 2012-07-03 MED ORDER — LABETALOL HCL 5 MG/ML IV SOLN
10.0000 mg | Freq: Once | INTRAVENOUS | Status: AC
  Administered 2012-07-03: 10 mg via INTRAVENOUS
  Filled 2012-07-03: qty 4

## 2012-07-03 MED ORDER — ASPIRIN 300 MG RE SUPP: 300.0000 mg | Freq: Every day | RECTAL | Status: DC

## 2012-07-03 MED ORDER — HYDRALAZINE HCL 20 MG/ML IJ SOLN
10.0000 mg | INTRAMUSCULAR | Status: DC | PRN
  Administered 2012-07-03 – 2012-07-04 (×3): 10 mg via INTRAVENOUS
  Filled 2012-07-03 (×4): qty 1

## 2012-07-03 MED ORDER — ENOXAPARIN SODIUM 40 MG/0.4ML ~~LOC~~ SOLN
40.0000 mg | SUBCUTANEOUS | Status: DC
  Administered 2012-07-03 – 2012-07-04 (×2): 40 mg via SUBCUTANEOUS
  Filled 2012-07-03 (×3): qty 0.4

## 2012-07-03 MED ORDER — ENOXAPARIN SODIUM 30 MG/0.3ML ~~LOC~~ SOLN: 30.0000 mg | Freq: Two times a day (BID) | SUBCUTANEOUS | Status: DC

## 2012-07-03 MED ORDER — LORAZEPAM 0.5 MG PO TABS
0.5000 mg | ORAL_TABLET | Freq: Once | ORAL | Status: AC
  Administered 2012-07-03: 0.5 mg via ORAL
  Filled 2012-07-03: qty 1

## 2012-07-03 NOTE — Progress Notes (Signed)
Notified MD of break-through pain (headache) rated at 9/10.

## 2012-07-03 NOTE — ED Notes (Signed)
I told Pt of the urine sample, pt stated that she has to pee to bad to pee in the cup RN is aware.

## 2012-07-03 NOTE — ED Notes (Signed)
Pt states she woke up at 0600 with trouble walking. Pt stated she had right sided weakness. Speech is clear. Smile is symmetrical. Grasp are strong and equal bilaterally at present time. Pt was able to get into the bed from the wheel chair on her own. Pt has been out of BP med for ~ 1 year.

## 2012-07-03 NOTE — H&P (Addendum)
Hospital Admission Note Date: 07/03/2012  Patient name: Tracy [name] Medical record number: [id number] Date of birth: 1961/02/13 Age: 51 y.o. Gender: female PCP: none, previously went to [facility]  Attending physician: Delfina Redwood, MD  Chief Complaint: "can't walk"  History of Present Illness:  Tracy [name] is an 51 y.o. female who presents with difficulty walking after waking up this morning. Her right leg is weak. She had no other symptoms. She is a difficult historian, but family members provide some history. She has a history of hypertension and previously was on 3 antihypertensives. However, she has been noncompliant for over a year. Her family reports that she has similar symptoms occasionally but has never presented to the hospital. She's had an episode where she had weakness in the right leg previously. She had weakness in the left hand previously. She has had slurred speech previously. These episodes have always resolved spontaneously and she has no residual weakness. Apparently, today was worse than usual, and she was unable to walk to work so she came to the emergency room. She arrived just after 8 AM. I was paged to see her at about 9:30 AM. Family reports that there is been no noted change in her speech. She smokes one to 2 packs of cigarettes a day. Does not drink. Does not use drugs currently. She had an MI several years ago, but I have no records. CT brain shows nothing acute but multiple age-indeterminate lacunar infarcts. Blood pressure initially was 240/140. She has no complaints of headache, visual changes. Difficulty speaking or eating. She has passed the RN stroke swallow screen.  Past Medical History  Diagnosis Date  . Hypertension   . Myocardial infarct, old     Meds: Prescriptions prior to admission  Medication Sig Dispense Refill  . hydrochlorothiazide (HYDRODIURIL) 25 MG tablet Take 1 tablet (25 mg total) by mouth daily.  30 tablet  0  .  metoprolol succinate (TOPROL-XL) 100 MG 24 hr tablet Take 1 tablet (100 mg total) by mouth daily.  30 tablet  0    Allergies: Review of patient's allergies indicates no known allergies. History   Social history: Smokes one to 2 packs of cigarettes a day. Does not drink. Has used cocaine in the past many years ago but denies illicit drug use currently. Has 2 children. Works part-time at [employer]. High school graduate.  Family history: Mother had cerebral aneurysm and stroke. She also had hypertension. Father had a stroke.  Past Surgical History  Procedure Date  . Abdominal hysterectomy    Health maintenance: Has never had a colonoscopy. Has not had a mammogram in many years.  Review of Systems: Systems reviewed and as per HPI, otherwise negative.  Physical Exam: Blood pressure 178/112, pulse 75, temperature 97.8 F (36.6 C), temperature source Oral, resp. rate 22, height 5\' 6"  (1.676 m), weight 98.8 kg (217 lb 13 oz), SpO2 97.00%. BP 178/112  Pulse 75  Temp 97.8 F (36.6 C) (Oral)  Resp 22  Ht 5\' 6"  (1.676 m)  Wt 98.8 kg (217 lb 13 oz)  BMI 35.16 kg/m2  SpO2 97%  General Appearance:    Alert, cooperative, no distress, appears stated age, unkempt   Head:    Normocephalic, without obvious abnormality, atraumatic  Eyes:    PERRL, conjunctiva/corneas clear, EOM's intact, muddy sclera. Fundi difficult to visualize      Nose:   Nares normal, septum midline, mucosa normal, no drainage    or sinus tenderness  Throat:  Lips, mucosa, and tongue normal; port intubation with multiple missing teeth   Neck:   Supple, symmetrical, trachea midline, no adenopathy;    thyroid:  no enlargement/tenderness/nodules; no carotid   bruit or JVD  Back:     Symmetric, no curvature, ROM normal, no CVA tenderness  Lungs:     Clear to auscultation bilaterally, respirations unlabored  Chest Wall:    No tenderness or deformity   Heart:    Regular rate and rhythm, S1 and S2 normal, no murmur, rub   or  gallop  Breast Exam:    No tenderness, masses, or nipple abnormality, no axillary lymphadenopathy   Abdomen:     Soft, non-tender, bowel sounds active all four quadrants,    no masses, no organomegaly  Genitalia:   deferred   Rectal:   deferred to   Extremities:   Extremities normal, atraumatic, no cyanosis or edema  Pulses:   2+ and symmetric all extremities  Skin:   Skin color, texture, turgor normal, no rashes or lesions  Lymph nodes:   Cervical, supraclavicular, and axillary nodes normal  Neurologic:   speech is slightly thick but family reports this is unchanged from usual and likely related to her missing teeth. CNII-XII intact, right leg strength 3/5. Finger to nose normal. Patellar reflexes 3+. Babinski downgoing on the left. Equivocal on the right. Sensation intact.     Lab results: Basic Metabolic Panel:  Basename 07/03/12 0831  NA 143  K 3.6  CL 106  CO2 25  GLUCOSE 105*  BUN 13  CREATININE 0.99  CALCIUM 9.2  MG --  PHOS --   Liver Function Tests:  Basename 07/03/12 0831  AST 18  ALT 16  ALKPHOS 107  BILITOT 0.5  PROT 6.7  ALBUMIN 3.5   No results found for this basename: LIPASE:2,AMYLASE:2 in the last 72 hours No results found for this basename: AMMONIA:2 in the last 72 hours CBC:  Basename 07/03/12 0831  WBC 7.0  NEUTROABS 4.5  HGB 16.2*  HCT 46.6*  MCV 90.5  PLT 129*   Cardiac Enzymes:  Basename 07/03/12 0831  CKTOTAL 117  CKMB 2.8  CKMBINDEX --  TROPONINI <0.30   BNP: No results found for this basename: PROBNP:3 in the last 72 hours D-Dimer: No results found for this basename: DDIMER:2 in the last 72 hours CBG:  Basename 07/03/12 0832  GLUCAP 108*   Hemoglobin A1C: No results found for this basename: HGBA1C in the last 72 hours Fasting Lipid Panel: No results found for this basename: CHOL,HDL,LDLCALC,TRIG,CHOLHDL,LDLDIRECT in the last 72 hours Thyroid Function Tests: No results found for this basename:  TSH,T4TOTAL,FREET4,T3FREE,THYROIDAB in the last 72 hours Anemia Panel: No results found for this basename: VITAMINB12,FOLATE,FERRITIN,TIBC,IRON,RETICCTPCT in the last 72 hours Coagulation:  Basename 07/03/12 0831  LABPROT 13.7  INR 1.03   Urine Drug Screen: Drugs of Abuse  No results found for this basename: labopia, cocainscrnur, labbenz, amphetmu, thcu, labbarb    Alcohol Level: No results found for this basename: ETH:2 in the last 72 hours Urinalysis: No results found for this basename: COLORURINE:2,APPERANCEUR:2,LABSPEC:2,PHURINE:2,GLUCOSEU:2,HGBUR:2,BILIRUBINUR:2,KETONESUR:2,PROTEINUR:2,UROBILINOGEN:2,NITRITE:2,LEUKOCYTESUR:2 in the last 72 hours  Imaging results:  Dg Chest 1 View  07/03/2012  *RADIOLOGY REPORT*  Clinical Data: Right-sided weakness  CHEST - 1 VIEW  Comparison: 05/08/2007  Findings: Lungs are essentially clear. No pleural effusion or pneumothorax.  Mild cardiomegaly.  IMPRESSION: No evidence of acute cardiopulmonary disease.  Mild cardiomegaly.  Original Report Authenticated By: Julian Hy, M.D.   Ct Head Wo Contrast  07/03/2012  *  RADIOLOGY REPORT*  Clinical Data: Difficulty walking, extremity weakness  CT HEAD WITHOUT CONTRAST  Technique:  Contiguous axial images were obtained from the base of the skull through the vertex without contrast.  Comparison: None.  Findings: No evidence of parenchymal hemorrhage or extra-axial fluid collection. No mass lesion, mass effect, or midline shift.  No CT evidence of acute infarction.  Lacunar infarcts in the left basal ganglia (series 2/image 13), right centrum semiovale (series 2/image 17), and right pons (series 2/image 9), age indeterminate but likely chronic.  Subcortical white matter and periventricular small vessel ischemic changes.  Global cortical atrophy.  No ventriculomegaly.  The visualized paranasal sinuses are essentially clear. The mastoid air cells are unopacified.  No evidence of calvarial fracture.  IMPRESSION: No  evidence of acute intracranial abnormality.  Multiple lacunar infarcts, as described above, age indeterminate but likely chronic.  Original Report Authenticated By: Julian Hy, M.D.   EKG shows Assessment & Plan: Principal Problem:  *Acute ischemic stroke Active Problems:  Hypertensive urgency  Tobacco abuse  Noncompliance  Thrombocytopenia  Will get an echocardiogram, carotid Dopplers, MRI and a day. Patient is counseled to quit smoking and be compliant with medications. Will decrease blood pressure by no more than 15% today. Will give labetalol IV now and when necessary systolic above [id number] or diastolic above [id number]. Continue aspirin daily. Nicotine patch. Repeat CBC in the morning. Physical therapy.  Doree Barthel, M.D. 07/03/2012, 1:15 PM  After admission, patient's son arrived and reported continued crack cocaine abuse. I spoke to the patient again in private and she admitted to smoking crack yesterday. Patient's leg weakness is improving and she is now able to lift right leg up off the bed. She has refused MRI because of claustrophobia. Will stop labetalol in the setting of recent cocaine use. Instead, she will get hydralazine as needed. We'll also consult social work for substance abuse problems. Patient says that "it was only a little bit" in that she "only uses it every once in a while". I counseled her about complete abstinence from cigarettes and crack cocaine. She tells me, "never again." This could potentially be related to cocaine-induced vasospasm, or hypertensive emergency, rather than thrombotic or embolic phenomenon. Unfortunately, she has refused MRI, so etiology may be elusive. Await carotid Dopplers and echocardiogram.

## 2012-07-03 NOTE — Progress Notes (Signed)
Physical Therapy Evaluation Patient Details Name: Tracy [name] MRN: [id number] DOB: [birth year] Today's Date: 07/03/2012 Time: [id number] PT Time Calculation (min): 27 min  PT Assessment / Plan / Recommendation Clinical Impression  Ms. Blatchford is referred to PT for weakness after possibly TIA.  She has regained strength in RLE (3+/5 overall from this morning), however is limited in her activity tolerance and PLOF strength.  She reports she was unable to lift her RLE this morning and now is starting to regain her overall strength.  her RUE is also 3/5 overall for shoulder and elbow strength.  recommend OPPT to address impairments.  Gave son Inez Catalina Ratliffs number to discuss payment options.     PT Assessment  Patient needs continued PT services    Follow Up Recommendations  Outpatient PT    Barriers to Discharge        Equipment Recommendations  Rolling walker with 5" wheels    Recommendations for Other Services OT consult   Frequency Min 5X/week    Precautions / Restrictions     Pertinent Vitals/Pain No      Mobility  Bed Mobility Bed Mobility: Rolling Left;Supine to Sit;Sit to Supine Rolling Left: 6: Modified independent (Device/Increase time) Supine to Sit: 4: Min guard;4: Min assist Sit to Supine: 6: Modified independent (Device/Increase time) Transfers Transfers: Sit to Stand;Stand to Sit Sit to Stand: 6: Modified independent (Device/Increase time);From bed Stand to Sit: To bed;6: Modified independent (Device/Increase time) Details for Transfer Assistance: 4x for activity tolerance and instruction for RW Ambulation/Gait Ambulation/Gait Assistance: 6: Modified independent (Device/Increase time) Ambulation Distance (Feet): 3 Feet (Repeated forward and backward 4x) Assistive device: Rolling walker Gait Pattern: Decreased stance time - right;Wide base of support General Gait Details: decreased ambulation distance secondary to Lines and leads and limited time for  pt to transfer to MRI.  Stairs: No Wheelchair Mobility Wheelchair Mobility: No    Exercises General Exercises - Lower Extremity Gluteal Sets: AROM;Both;10 reps;Seated Hip ABduction/ADduction: AROM;Right;10 reps;Seated Straight Leg Raises: AROM;Right;10 reps;Seated Toe Raises: AROM;Both;10 reps;Standing Heel Raises: AROM;Both;10 reps;Standing   PT Diagnosis: Difficulty walking;Abnormality of gait;Generalized weakness  PT Problem List: Decreased strength;Decreased activity tolerance;Decreased knowledge of use of DME PT Treatment Interventions: Gait training;Stair training;Functional mobility training;Therapeutic activities;Therapeutic exercise;Balance training;Neuromuscular re-education;Patient/family education   PT Goals Acute Rehab PT Goals PT Goal Formulation: With patient/family Time For Goal Achievement: 07/09/12 Potential to Achieve Goals: Good Pt will go Sit to Stand: with modified independence PT Goal: Sit to Stand - Progress: Goal set today Pt will go Stand to Sit: with modified independence PT Goal: Stand to Sit - Progress: Goal set today Pt will Stand: with modified independence PT Goal: Stand - Progress: Goal set today Pt will Ambulate: >150 feet;with modified independence PT Goal: Ambulate - Progress: Goal set today Pt will Go Up / Down Stairs: 3-5 stairs;with supervision;with least restrictive assistive device;with rail(s) (rails to act as side of house) PT Goal: Up/Down Stairs - Progress: Goal set today  Visit Information  Last PT Received On: 07/03/12 PT/OT Co-Evaluation/Treatment: Yes    Subjective Data  Subjective: I went to fireworks last night and today I could barely move my R leg.  Patient Stated Goal: I need to be able to walk to work again.    Prior Functioning  Home Living Lives With: Alone Available Help at Discharge: Family Type of Home: House Home Access: Stairs to enter [employer] of Steps: 3 w/house next to her stair which she can  reach and hold onto  Entrance Stairs-Rails: None Home Layout: One level Home Adaptive Equipment: None Prior Function Level of Independence: Independent Able to Take Stairs?: Yes Vocation: Full time employment Comments: Walks and works at [employer] close to home.  Communication Communication: No difficulties    Cognition       Extremity/Trunk Assessment Right Upper Extremity Assessment RUE ROM/Strength/Tone: Deficits RUE ROM/Strength/Tone Deficits: 3/5 for shoulder and elbow strength   Balance    End of Session PT - End of Session Equipment Utilized During Treatment: Gait belt Activity Tolerance: Patient tolerated treatment well Patient left: in bed;with family/visitor present (w/Dr. Conley Canal in room) Nurse Communication: Mobility status  GP     Ellenie Salome 07/03/2012, 3:37 PM

## 2012-07-03 NOTE — Evaluation (Signed)
Occupational Therapy Evaluation Patient Details Name: Tracy [name] MRN: [id number] DOB: [birth year] Today's Date: 07/03/2012 Time: [id number] OT Time Calculation (min): 22 min  OT Assessment / Plan / Recommendation Clinical Impression  Patient is a 51 y/o female s/p Acute Ischemic Stroke presenting to acute OT with deficits below. Patient will benefit from acute OT services to increase functional performance during ADL tasks and allow for a safe return home.    OT Assessment  Patient needs continued OT Services    Follow Up Recommendations  Outpatient OT       Equipment Recommendations  Tub/shower seat       Frequency  Min 3X/week    Precautions / Restrictions Precautions Precautions: Fall   Pertinent Vitals/Pain No reports of pain.    ADL  Grooming: Simulated;Supervision/safety Where Assessed - Grooming: Supported sitting Upper Body Bathing: Simulated;Supervision/safety Where Assessed - Upper Body Bathing: Unsupported sitting Lower Body Bathing: Simulated;Supervision/safety Where Assessed - Lower Body Bathing: Supported sit to stand Upper Body Dressing: Simulated;Minimal assistance Where Assessed - Upper Body Dressing: Unsupported sitting Lower Body Dressing: Performed;Min guard Where Assessed - Lower Body Dressing: Supported sit to Lobbyist: Performed;Minimal Print production planner Method: Sit to Loss adjuster, chartered:  (to recliner) Toileting - Water quality scientist and Hygiene: Simulated;Minimal assistance Equipment Used: Gait belt Transfers/Ambulation Related to ADLs: Patient transfers at a [employer] level with RW.    OT Diagnosis: Generalized weakness  OT Problem List: Decreased strength;Decreased cognition;Impaired balance (sitting and/or standing);Decreased safety awareness;Decreased knowledge of use of DME or AE;Decreased coordination;Impaired UE functional use OT Treatment Interventions: Self-care/ADL training;Therapeutic  exercise;Neuromuscular education;Energy conservation;DME and/or AE instruction;Therapeutic activities;Patient/family education;Balance training   OT Goals Acute Rehab OT Goals OT Goal Formulation: With patient Time For Goal Achievement: 07/17/12 Potential to Achieve Goals: Good ADL Goals Pt Will Perform Lower Body Bathing: with supervision;Sitting, edge of bed ADL Goal: Lower Body Bathing - Progress: Goal set today Pt Will Perform Lower Body Dressing: with supervision;Sit to stand from chair ADL Goal: Lower Body Dressing - Progress: Goal set today Pt Will Transfer to Toilet: with supervision;Regular height toilet;Grab bars ADL Goal: Toilet Transfer - Progress: Goal set today Pt Will Perform Toileting - Clothing Manipulation: with supervision;Standing ADL Goal: Toileting - Clothing Manipulation - Progress: Goal set today Pt Will Perform Toileting - Hygiene: with supervision;Sit to stand from 3-in-1/toilet ADL Goal: Toileting - Hygiene - Progress: Goal set today Pt Will Perform Tub/Shower Transfer: Tub transfer;with supervision;Ambulation;Shower seat with back;Grab bars ADL Goal: Clinical cytogeneticist - Progress: Goal set today Additional ADL Goal #1: patient will increase RUE strength and endurance to 4/5 to increase ADL performance and allow for a safe return home. ADL Goal: Additional Goal #1 - Progress: Goal set today Arm Goals Pt Will Complete Theraputty Exer: with supervision, verbal cues required/provided;to increase strength;Mod resistance putty;Right upper extremity Arm Goal: Theraputty Exercises - Progress: Goal set today Additional Arm Goal #1: Patient will increase RUE fine motor coordination to increase ADL performance and allow for a safe return home. Arm Goal: Additional Goal #1 - Progress: Goal set today  Visit Information  Last OT Received On: 07/03/12 Assistance Needed: +1    Subjective Data  Subjective: "I'm in no pain." Patient Stated Goal: "Go home and back to  work"   Prior [place] Lives With: Alone Available Help at Discharge: Family Type of Home: House Home Access: Stairs to enter Technical brewer of Steps: 2 Entrance Stairs-Rails: None Home Layout: One level Bathroom Shower/Tub: Tub/shower unit;Curtain  Bathroom Toilet: Standard Home Adaptive Equipment: Hand-held shower hose Prior Function Level of Independence: Independent Able to Take Stairs?: Yes Driving: No 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 MMT: 3+/5 for shoulder and elbow ranges. RUE Coordination: Deficits RUE Coordination Deficits: RUE gross motor impaired. Left Upper Extremity Assessment LUE ROM/Strength/Tone: Deficits LUE ROM/Strength/Tone Deficits: A/ROM WFL in all ranges. MMT: 4-/5 for shoulder and elbow ranges. LUE Coordination: WFL - gross/fine motor   Mobility Bed Mobility Bed Mobility: Supine to Sit;Sitting - Scoot to Edge of Bed;Scooting to HOB Supine to Sit: 4: Min assist;HOB elevated;With rails Sitting - Scoot to Edge of Bed: 4: Min assist;With rail Scooting to [facility]: 4: Min assist;With rail Transfers Transfers: Sit to Stand;Stand to Sit Sit to Stand: 4: Min assist;From bed Stand to Sit: 4: Min assist;To chair/3-in-1;With armrests Details for Transfer Assistance: vc's for hand placement and safety techniques with RW during functional transfer.          End of Session OT - End of Session Equipment Utilized During Treatment: Gait belt Activity Tolerance: Patient  tolerated treatment well Patient left: in chair;with call bell/phone within reach;with family/visitor present    Ceanna Wareing, Clarene Duke, OTR/L 07/03/2012, 4:11 PM

## 2012-07-03 NOTE — Plan of Care (Signed)
Problem: Phase I Progression Outcomes Goal: Strict NPO til swallow screen done Outcome: Completed/Met Date Met:  07/03/12 Pt on cardiac diet at this time Goal: Voiding-avoid urinary catheter unless indicated Outcome: Completed/Met Date Met:  07/03/12 Pt using BSC

## 2012-07-03 NOTE — Progress Notes (Signed)
*  PRELIMINARY RESULTS* Echocardiogram 2D Echocardiogram has been performed.  Tracy [name] 07/03/2012, 11:46 AM

## 2012-07-03 NOTE — ED Provider Notes (Signed)
History  This chart was scribed for Sharyon Cable, MD by Jenne Campus. This patient was seen in room APA06/APA06 and the patient's care was started at 8:08AM.  CSN: [id number]  Arrival date & time 07/03/12  0806   First MD Initiated Contact with Patient 07/03/12 [phone]       Chief Complaint - weakness  Patient is a 51 y.o. female presenting with extremity weakness. The history is provided by the patient. No language interpreter was used.  Extremity Weakness This is a new problem. The current episode started 3 to 5 hours ago. The problem occurs constantly. The problem has not changed since onset.Pertinent negatives include no chest pain, no abdominal pain and no shortness of breath. The symptoms are aggravated by walking. The symptoms are relieved by rest. She has tried nothing for the symptoms.    Tracy [name] is a 51 y.o. female who presents to the Emergency Department complaining of 3.5 to 4 hours of sudden onset, non-changing, constant right-sided weakness that she woke up with this morning. She reports no symptoms last night.  Daughter reports that the pt has been having trouble walking described as stumbling since this morning after waking up. At baseline, pt has not difficulty walking. Pt denies SOB, chest pain, abdominal pain, and visual disturbances as associated symptoms. She denies having any prior TIAs or CVAs. She also has a h/o HTN and one prior MI. She is a current everyday smoker but denies alcohol use.  She did report some numbness to her right LE but reports this is improving.  No abdominal pain.  Denies radiation of pain/numbness from back into her leg.   Symptoms are improved by rest Symptoms worsened by ambulation   Past Medical History  Diagnosis Date  . Hypertension   . Myocardial infarct, old     Past Surgical History  Procedure Date  . Abdominal hysterectomy     No family history on file.  History  Substance Use Topics  . Smoking status:  Current Everyday Smoker    Types: Cigarettes  . Smokeless tobacco: Not on file  . Alcohol Use: No    No OB history provided.  Review of Systems  Constitutional: Negative for fever.  Respiratory: Negative for shortness of breath.   Cardiovascular: Negative for chest pain.  Gastrointestinal: Negative for abdominal pain.  Musculoskeletal: Positive for extremity weakness.  Neurological: Positive for weakness.  All other systems reviewed and are negative.    Allergies  Review of patient's allergies indicates no known allergies.  Home Medications   Current Outpatient Rx  Name Route Sig Dispense Refill  . HYDROCHLOROTHIAZIDE 25 MG PO TABS Oral Take 1 tablet (25 mg total) by mouth daily. 30 tablet 0  . METOPROLOL SUCCINATE ER 100 MG PO TB24 Oral Take 1 tablet (100 mg total) by mouth daily. 30 tablet 0   Triage Vitals: BP 229/124  Pulse 83  Temp 98 F (36.7 C) (Oral)  Resp 16  SpO2 98%  Physical Exam  Nursing note and vitals reviewed.  CONSTITUTIONAL: Well developed/well nourished HEAD AND FACE: Normocephalic/atraumatic EYES: EOMI/PERRL ENMT: Mucous membranes moist, poor dentition NECK: supple no meningeal signs SPINE:entire spine nontender CV: S1/S2 noted, no murmurs/rubs/gallops noted LUNGS: Lungs are clear to auscultation bilaterally, no apparent distress ABDOMEN: soft, nontender, no rebound or guarding GU:no cva tenderness NEURO: Awake/alert, facies symmetric, no arm drift is noted, right leg drift is noted when resisting gravity Cranial nerves 3/4/5/6/07/07/09/11/12 tested and intact Ataxia is noted, no past pointing,  no visual deficit noted EXTREMITIES: pulses normal distally, full ROM SKIN: warm, color normal PSYCH: no abnormalities of mood noted  ED Course  Procedures   DIAGNOSTIC STUDIES: Oxygen Saturation is 98% on RA, normal by my interpretation.    COORDINATION OF CARE:  At 8:18AM pt to begin CVA workup. Patient to have head CT.   Patient woke up with  symptoms.   tPA in stroke considered but not given due to:  Onset over 3-4.5hours (woke up with symptoms)  8:41 AM Pt denies new abdominal or back pain.  Suspect CVA at this time Will follow closely Difficult to assess speech due to poor dentition  9:29 AM Pt without any new complaints but still has difficulty walking due to weakness Discussed ct imaging findings Discussed need for admission Patient agreeable 9:42 AM D/w triad will admit, dr Conley Canal to see in the ED.  She will dose HTN meds.  Will place on stepdown Pt took ASA at home    MDM  Nursing notes including past medical history and social history reviewed and considered in documentation labs/vitals reviewed and considered Previous records reviewed and considered - no recent neuroimaging noted xrays reviewed and considered     Date: 07/03/2012  Rate: 78  Rhythm: normal sinus rhythm  QRS Axis: left  Intervals: normal  ST/T Wave abnormalities: nonspecific ST changes  Conduction Disutrbances:none  Narrative Interpretation:   Old EKG Reviewed: changes noted     I personally performed the services described in this documentation, which was scribed in my presence. The recorded information has been reviewed and considered.      Sharyon Cable, MD 07/03/12 [phone]

## 2012-07-04 ENCOUNTER — Encounter (HOSPITAL_COMMUNITY): Payer: Self-pay | Admitting: Internal Medicine

## 2012-07-04 DIAGNOSIS — D696 Thrombocytopenia, unspecified: Secondary | ICD-10-CM

## 2012-07-04 DIAGNOSIS — G8190 Hemiplegia, unspecified affecting unspecified side: Secondary | ICD-10-CM

## 2012-07-04 DIAGNOSIS — I517 Cardiomegaly: Secondary | ICD-10-CM

## 2012-07-04 DIAGNOSIS — I6789 Other cerebrovascular disease: Secondary | ICD-10-CM

## 2012-07-04 HISTORY — DX: Cardiomegaly: I51.7

## 2012-07-04 HISTORY — DX: Hemiplegia, unspecified affecting unspecified side: G81.90

## 2012-07-04 LAB — HEMOGLOBIN A1C: Mean Plasma Glucose: 137 mg/dL — ABNORMAL HIGH (ref <117)

## 2012-07-04 LAB — CBC
MCH: 31.3 pg (ref 26.0–34.0)
MCV: 90.8 fL (ref 78.0–100.0)
Platelets: 138 10*3/uL — ABNORMAL LOW (ref 150–400)
RDW: 13.6 % (ref 11.5–15.5)

## 2012-07-04 LAB — LIPID PANEL
Cholesterol: 150 mg/dL (ref 0–200)
HDL: 62 mg/dL (ref >39)
Total CHOL/HDL Ratio: 2.4 RATIO
Triglycerides: 65 mg/dL (ref <150)

## 2012-07-04 MED ORDER — METOPROLOL TARTRATE 25 MG PO TABS: 25.0000 mg | ORAL_TABLET | Freq: Two times a day (BID) | ORAL | Status: DC

## 2012-07-04 MED ORDER — HYDROCODONE-ACETAMINOPHEN 5-325 MG PO TABS: 1.0000 | ORAL_TABLET | Freq: Four times a day (QID) | ORAL | Status: AC | PRN

## 2012-07-04 MED ORDER — LISINOPRIL 5 MG PO TABS: 5.0000 mg | ORAL_TABLET | Freq: Every day | ORAL | Status: DC

## 2012-07-04 MED ORDER — HYDROCHLOROTHIAZIDE 12.5 MG PO CAPS: 12.5000 mg | ORAL_CAPSULE | Freq: Every day | ORAL | Status: DC

## 2012-07-04 MED ORDER — HYDROCHLOROTHIAZIDE 12.5 MG PO CAPS
12.5000 mg | ORAL_CAPSULE | Freq: Every day | ORAL | Status: DC
  Administered 2012-07-04: 12.5 mg via ORAL
  Filled 2012-07-04: qty 1

## 2012-07-04 MED ORDER — SODIUM CHLORIDE 0.9 % IJ SOLN
INTRAMUSCULAR | Status: AC
  Administered 2012-07-04: 09:00:00
  Filled 2012-07-04: qty 3

## 2012-07-04 MED ORDER — ASPIRIN 325 MG PO TABS: 325.0000 mg | ORAL_TABLET | Freq: Every day | ORAL | Status: AC

## 2012-07-04 MED ORDER — LISINOPRIL 10 MG PO TABS
5.0000 mg | ORAL_TABLET | Freq: Every day | ORAL | Status: DC
  Administered 2012-07-04: 5 mg via ORAL
  Filled 2012-07-04: qty 1

## 2012-07-04 NOTE — Plan of Care (Signed)
Problem: Consults Goal: Stroke - Ischemic/TIA Patient Education See Patient Education Module for education specifics.  Outcome: Completed/Met Date Met:  07/04/12 Printed and gave patient educational material regarding strokes; as well as, smoking cessation

## 2012-07-04 NOTE — Progress Notes (Signed)
P.T. HAS BEEN IN AND SEEN PT. PT UP W/ FRONT ROLLING WALKER IN ROOM AND HALLWAY. NOW UP IN CHAIR.  [facility] HAS BEEN CONTACTED FOR HH REFERRAL WHICH WILL BE DONE ON Monday 07/06/2012.  FAMILY IS AT BEDSIED WAITING TO TALK W/ DR Caryn Section.

## 2012-07-04 NOTE — Progress Notes (Signed)
Physical Therapy Treatment Patient Details Name: Tracy [name] MRN: [id number] DOB: January 13, 1961 Today's Date: 07/04/2012 Time: [id number] PT Time Calculation (min): 40 min  PT Assessment / Plan / Recommendation Comments on Treatment Session  Patient was able to tolerate therapeutic activities today. Patient was able to perform transfer activities and short distance ambulation training (~67feet x 3trials) using a RW at [place] due to decreased motor control and coordination to BLE resulting to decreased accuracy of movement pattern more evident during transitional turns/directional changes placing pt at risk for falls. Patient evidently gained strength to RUE/LE (grossly graded 4-/5). Patient demonstrated good carryover for proper sequencing activities during mobility activities (pushing up and reaching back to chair during sit to stand and stand to sit activities respectively). Patient at this time may benefit with continued in-px rehab therapy and transition to HHPT as indicated. Patient wishes to return home, however,  there might be safety issues to be considered  as to whether family members will be with patient for assistance during pt's functional mobility/ADL.     Follow Up Recommendations  Home health PT;Inpatient Rehab    Barriers to Discharge        Equipment Recommendations  Defer to next venue    Recommendations for Other Services    Frequency Min 5X/week   Plan Discharge plan needs to be updated    Precautions / Restrictions Precautions Precautions: Fall Restrictions Weight Bearing Restrictions: No       Mobility  Bed Mobility Bed Mobility: Rolling Left;Sitting - Scoot to Edge of Bed;Sit to Supine Rolling Left: 6: Modified independent (Device/Increase time) Supine to Sit: 6: Modified independent (Device/Increase time) Sitting - Scoot to Edge of Bed: 6: Modified independent (Device/Increase time) Sit to Supine: 6: Modified independent (Device/Increase  time) Scooting to [facility]: 6: Modified independent (Device/Increase time) Transfers Transfers: Sit to Stand;Stand to Sit Sit to Stand: 5: Supervision Stand to Sit: 6: Modified independent (Device/Increase time) Details for Transfer Assistance: Patient able have good carryover on proper sequencing/hand placement during sit<>stand activities (90% of the time) Ambulation/Gait Ambulation/Gait Assistance: 4: Min guard Ambulation Distance (Feet): 30 Feet Assistive device: Rolling walker Ambulation/Gait Assistance Details: Patient requires Min to CGA during ambulation using a RW secondary to episodal/occasional antero- , retrograde LOB/instability with self-recovery placing patient at risk for falls.  Gait Pattern: Decreased stride length;Step-to pattern;Decreased step length - left General Gait Details: Decreased accuracy of movement particularly during transitional turns secondary to trunk and BLE wobble.  Stairs: No    Exercises General Exercises - Lower Extremity Ankle Circles/Pumps: AROM;Seated;10 reps Hip Flexion/Marching: AROM;20 reps;Standing Toe Raises: AROM;Sidelying;10 reps Heel Raises: AROM;Seated;10 reps Mini-Sqauts: AROM;20 reps;Standing   PT Diagnosis:    PT Problem List:   PT Treatment Interventions:     PT Goals Acute Rehab PT Goals PT Goal Formulation: With patient Pt will go Sit to Stand: with modified independence PT Goal: Sit to Stand - Progress: Progressing toward goal Pt will go Stand to Sit: with modified independence PT Goal: Stand to Sit - Progress: Updated due to goals met Pt will Stand: with modified independence PT Goal: Stand - Progress: Goal set today Pt will Ambulate: 51 - 150 feet;with modified independence;with rolling walker PT Goal: Ambulate - Progress: Goal set today Pt will Go Up / Down Stairs: 3-5 stairs;with supervision;with least restrictive assistive device;with rail(s) PT Goal: Up/Down Stairs - Progress: Goal set today  Visit Information  Last  PT Received On: 07/04/12    Subjective Data  Patient Stated Goal: To  walk and return home    Cognition  Overall Cognitive Status: Appears within functional limits for tasks assessed/performed Arousal/Alertness: Awake/alert Orientation Level: Appears intact for tasks assessed Behavior During Session: [facility] for tasks performed    Balance  Balance Balance Assessed: Yes Dynamic Sitting Balance Dynamic Sitting - Balance Support: No upper extremity supported;Feet supported Static Standing Balance Static Standing - Balance Support: Bilateral upper extremity supported;During functional activity Dynamic Standing Balance Dynamic Standing - Balance Support: Bilateral upper extremity supported;During functional activity  End of Session PT - End of Session Equipment Utilized During Treatment: Gait belt Activity Tolerance: Patient tolerated treatment well Patient left: in chair;with call bell/phone within reach;with family/visitor present Nurse Communication: Mobility status;Precautions   GP     Lilyanah Celestin, Inda Coke 07/04/2012, 12:29 PM

## 2012-07-04 NOTE — Progress Notes (Signed)
Clinical Social Work Department BRIEF PSYCHOSOCIAL ASSESSMENT 07/04/2012  Patient:  Tracy [name], Tracy [name]     Account Number:  [id number]     Admit date:  07/03/2012  Clinical Social Worker:  Levie Heritage  Date/Time:  07/04/2012 11:03 AM  Referred by:  Physician  Date Referred:  07/04/2012 Referred for  Psychosocial assessment   Other Referral:   Interview type:  Patient Other interview type:   Pt's daughter and son present.    PSYCHOSOCIAL DATA Living Status:  ALONE Admitted from facility:   Level of care:   Primary support name:  Jerod Primary support relationship to patient:  CHILD, ADULT Degree of support available:   Strong    CURRENT CONCERNS Current Concerns  Substance Abuse   Other Concerns:    SOCIAL WORK ASSESSMENT / PLAN Met with Pt to discuss positive drug screen for cocaine.    Pt reported that she used 1x recently and that she has no concerns regarding her substance use.  She stated that she is not interested in substance abuse tx and declined resources.    Pt's son and daughter stated that they feel that Pt does need tx and requested substance abuse tx information.    CSW provided Pt's family with substance abuse tx information.    No further needs identified.    Family to provide transportation home.    CSW to sign off.   Assessment/plan status:  Information/Referral to [employer] Other assessment/ plan:   Information/referral to community resources:    PATIENT'S/FAMILY'S RESPONSE TO PLAN OF CARE: Pt and family thanked CSW for time and assistance.   Bernita Raisin, [place] Work [phone]

## 2012-07-04 NOTE — Progress Notes (Signed)
DISCHARGE INSTRUCTIONS AND PRESCRIPTIONS GIVEN. [facility] IS SET UP TO ADMIT PT ON Monday  07/06/2012 FRONT ROLLING WALKER SENT HOME W/ PT.  FAMILY WILL REMAIN W/ PT OVER THE WEEKEND, BP 145/88. HR 82. NO SOB. NO FURTER C/O HEADACHE. IV D/C'D.

## 2012-07-04 NOTE — Discharge Summary (Signed)
Physician Discharge Summary  CLEMIE ELSWICK MRN: [id number] DOB/AGE: 05-17-1961 51 y.o.  PCP: [facility].   Admit date: 07/03/2012 Discharge date: 07/04/2012  Discharge Diagnoses:  1. Acute ischemic stroke, likely lacunar or left brain. 2. Radiographic evidence of multiple lacunar infarcts, age indeterminate. 3. 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 4. Malignant hypertension/hypertensive urgency. 5. Chronic cocaine use. 6. Tobacco abuse. 7. Thrombocytopenia, possibly secondary to malignant hypertension. 8. Moderate LVH. 9. Mild polycythemia, likely secondary to chronic tobacco use. 10. Noncompliance.    Medication List  As of 07/04/2012  1:52 PM   STOP taking these medications         hydrochlorothiazide 25 MG tablet      metoprolol succinate 100 MG 24 hr tablet         TAKE these medications         aspirin 325 MG tablet   Take 1 tablet (325 mg total) by mouth daily.      hydrochlorothiazide 12.5 MG capsule   Commonly known as: MICROZIDE   Take 1 capsule (12.5 mg total) by mouth daily. For high blood pressure      HYDROcodone-acetaminophen 5-325 MG per tablet   Commonly known as: NORCO   Take 1 tablet by mouth every 6 (six) hours as needed for pain.      lisinopril 5 MG tablet   Commonly known as: PRINIVIL,ZESTRIL   Take 1 tablet (5 mg total) by mouth daily. For high blood pressure      metoprolol tartrate 25 MG tablet   Commonly known as: LOPRESSOR   Take 1 tablet (25 mg total) by mouth 2 (two) times daily. For high blood pressure.            Discharge Condition: Improved.  Disposition: 01-Home or Self Care   Consults: Therapists.   Significant Diagnostic Studies: Dg Chest 1 View  07/03/2012  *RADIOLOGY REPORT*  Clinical Data: Right-sided weakness  CHEST - 1 VIEW  Comparison: 05/08/2007  Findings: Lungs are essentially clear. No pleural effusion or pneumothorax.  Mild cardiomegaly.  IMPRESSION: No evidence of  acute cardiopulmonary disease.  Mild cardiomegaly.  Original Report Authenticated By: Julian Hy, M.D.   Ct Head Wo Contrast  07/03/2012  *RADIOLOGY REPORT*  Clinical Data: Difficulty walking, extremity weakness  CT HEAD WITHOUT CONTRAST  Technique:  Contiguous axial images were obtained from the base of the skull through the vertex without contrast.  Comparison: None.  Findings: No evidence of parenchymal hemorrhage or extra-axial fluid collection. No mass lesion, mass effect, or midline shift.  No CT evidence of acute infarction.  Lacunar infarcts in the left basal ganglia (series 2/image 13), right centrum semiovale (series 2/image 17), and right pons (series 2/image 9), age indeterminate but likely chronic.  Subcortical white matter and periventricular small vessel ischemic changes.  Global cortical atrophy.  No ventriculomegaly.  The visualized paranasal sinuses are essentially clear. The mastoid air cells are unopacified.  No evidence of calvarial fracture.  IMPRESSION: No evidence of acute intracranial abnormality.  Multiple lacunar infarcts, as described above, age indeterminate but likely chronic.  Original Report Authenticated By: Julian Hy, M.D.   US Carotid Duplex Bilateral  07/03/2012  *RADIOLOGY REPORT*  Clinical Data: Bilateral age indeterminate non-hemorrhagic lacunar strokes on cranial CT earlier.  BILATERAL CAROTID DUPLEX ULTRASOUND 07/03/2012:  Technique: [facility] Cables scale imaging, color Doppler and duplex ultrasound was performed of bilateral carotid and vertebral arteries in the neck.  Comparison:  None.  Criteria:  Quantification of  carotid stenosis is based on velocity parameters that correlate the residual internal carotid diameter with NASCET-based stenosis levels, using the diameter of the distal internal carotid lumen as the denominator for stenosis measurement.  The following velocity measurements were obtained:                   PEAK SYSTOLIC/END DIASTOLIC RIGHT ICA:                         37/7cm/sec CCA:                        0000000 SYSTOLIC ICA/CCA RATIO:     [id number] DIASTOLIC ICA/CCA RATIO:    0.77 ECA:                        36/6cm/sec  LEFT ICA:                        47/12cm/sec CCA:                        [id number] SYSTOLIC ICA/CCA RATIO:     [id number] DIASTOLIC ICA/CCA RATIO:    2.02 ECA:                        31/5cm/sec  Findings:  RIGHT CAROTID ARTERY: Intimal thickening and mild noncalcified plaque involving the mid and distal CCA.  Moderate calcified plaque in the carotid bulb.  Moderate calcified plaque in the proximal ECA and ICA.  No visible stenosis of greater than 50% diameter at gray scale or color imaging.  Mild spectral broadening involving the ICA waveform.  RIGHT VERTEBRAL ARTERY:  Antegrade flow with normal waveform.  LEFT CAROTID ARTERY: Intimal thickening involving the CCA.  No significant calcified or noncalcified plaque involving the left carotid circulation.  No visible stenosis of greater than 50% diameter at gray scale or color imaging.  LEFT VERTEBRAL ARTERY:  Antegrade flow with normal waveform.  IMPRESSION:  1.  No evidence of hemodynamically significant stenosis involving either the right or left carotid circulation in the neck by Doppler criteria. 2.  Moderate calcified plaque in the right carotid bulb, proximal right ICA, and proximal right ECA. 3.  Mild noncalcified plaque in the mid and distal right CCA. 4.  Antegrade flow in both vertebral arteries.  Original Report Authenticated By: Deniece Portela, M.D.   ECHO:Study Conclusions  - Left ventricle: The cavity size was normal. Wall thickness was increased in a pattern of moderate LVH. Systolic function was normal. The estimated ejection fraction was in the range of 60% to 65%. Although no diagnostic regional wall motion abnormality was identified, this possibility cannot be completely excluded on the basis of this study. Left ventricular diastolic function parameters were normal. - Mitral  valve: Calcified annulus. Mildly thickened leaflets . - Left atrium: The atrium was mildly dilated. Impressions:  - No cardiac source of emboli was indentified. Transthoracic echocardiography. M-mode, complete 2D, spectral Doppler, and color Doppler. Height: Height: 167.6cm. Height: 66in. Weight: Weight: 98.4kg. Weight: 216.5lb. Body mass index: BMI: 35kg/m^2. Body surface area: BSA: 2.72m^2. Patient status: Inpatient. Location: Bedside.    Microbiology: No results found for this or any previous visit (from the past 240 hour(s)).   Labs: Results for orders placed during the hospital encounter of 07/03/12 (from the past 48 hour(s))  PROTIME-INR  Status: Normal   Collection Time   07/03/12  8:31 AM      Component Value Range Comment   Prothrombin Time 13.7  11.6 - 15.2 seconds    INR 1.03  0.00 - 1.49   APTT     Status: Normal   Collection Time   07/03/12  8:31 AM      Component Value Range Comment   aPTT 26  24 - 37 seconds   CBC     Status: Abnormal   Collection Time   07/03/12  8:31 AM      Component Value Range Comment   WBC 7.0  4.0 - 10.5 K/uL    RBC 5.15 (*) 3.87 - 5.11 MIL/uL    Hemoglobin 16.2 (*) 12.0 - 15.0 g/dL    HCT 46.6 (*) 36.0 - 46.0 %    MCV 90.5  78.0 - 100.0 fL    MCH 31.5  26.0 - 34.0 pg    MCHC 34.8  30.0 - 36.0 g/dL    RDW 13.4  11.5 - 15.5 %    Platelets 129 (*) 150 - 400 K/uL   DIFFERENTIAL     Status: Normal   Collection Time   07/03/12  8:31 AM      Component Value Range Comment   Neutrophils Relative 64  43 - 77 %    Neutro Abs 4.5  1.7 - 7.7 K/uL    Lymphocytes Relative 31  12 - 46 %    Lymphs Abs 2.2  0.7 - 4.0 K/uL    Monocytes Relative 4  3 - 12 %    Monocytes Absolute 0.3  0.1 - 1.0 K/uL    Eosinophils Relative 1  0 - 5 %    Eosinophils Absolute 0.1  0.0 - 0.7 K/uL    Basophils Relative 0  0 - 1 %    Basophils Absolute 0.0  0.0 - 0.1 K/uL   COMPREHENSIVE METABOLIC PANEL     Status: Abnormal   Collection Time   07/03/12  8:31 AM       Component Value Range Comment   Sodium 143  135 - 145 mEq/L    Potassium 3.6  3.5 - 5.1 mEq/L    Chloride 106  96 - 112 mEq/L    CO2 25  19 - 32 mEq/L    Glucose, Bld 105 (*) 70 - 99 mg/dL    BUN 13  6 - 23 mg/dL    Creatinine, Ser 0.99  0.50 - 1.10 mg/dL    Calcium 9.2  8.4 - 10.5 mg/dL    Total Protein 6.7  6.0 - 8.3 g/dL    Albumin 3.5  3.5 - 5.2 g/dL    AST 18  0 - 37 U/L    ALT 16  0 - 35 U/L    Alkaline Phosphatase 107  39 - 117 U/L    Total Bilirubin 0.5  0.3 - 1.2 mg/dL    GFR calc non Af Amer 65 (*) >90 mL/min    GFR calc Af Amer 75 (*) >90 mL/min   CK TOTAL AND CKMB     Status: Normal   Collection Time   07/03/12  8:31 AM      Component Value Range Comment   Total CK 117  7 - 177 U/L    CK, MB 2.8  0.3 - 4.0 ng/mL    Relative Index 2.4  0.0 - 2.5   TROPONIN I     Status:  Normal   Collection Time   07/03/12  8:31 AM      Component Value Range Comment   Troponin I <0.30  <0.30 ng/mL   GLUCOSE, CAPILLARY     Status: Abnormal   Collection Time   07/03/12  8:32 AM      Component Value Range Comment   Glucose-Capillary 108 (*) 70 - 99 mg/dL   URINE RAPID DRUG SCREEN (HOSP PERFORMED)     Status: Abnormal   Collection Time   07/03/12 11:13 AM      Component Value Range Comment   Opiates NONE DETECTED  NONE DETECTED    Cocaine POSITIVE (*) NONE DETECTED    Benzodiazepines NONE DETECTED  NONE DETECTED    Amphetamines NONE DETECTED  NONE DETECTED    Tetrahydrocannabinol NONE DETECTED  NONE DETECTED    Barbiturates NONE DETECTED  NONE DETECTED   LIPID PANEL     Status: Normal   Collection Time   07/04/12  5:23 AM      Component Value Range Comment   Cholesterol 150  0 - 200 mg/dL    Triglycerides 65  <150 mg/dL    HDL 62  >39 mg/dL    Total CHOL/HDL Ratio 2.4      VLDL 13  0 - 40 mg/dL    LDL Cholesterol 75  0 - 99 mg/dL   CBC     Status: Abnormal   Collection Time   07/04/12  5:23 AM      Component Value Range Comment   WBC 7.5  4.0 - 10.5 K/uL    RBC 5.11  3.87 - 5.11  MIL/uL    Hemoglobin 16.0 (*) 12.0 - 15.0 g/dL    HCT 46.4 (*) 36.0 - 46.0 %    MCV 90.8  78.0 - 100.0 fL    MCH 31.3  26.0 - 34.0 pg    MCHC 34.5  30.0 - 36.0 g/dL    RDW 13.6  11.5 - 15.5 %    Platelets 138 (*) 150 - 400 K/uL      HPI : The patient is a 51 year old woman with a history significant for hypertension, who presented to the emergency department on 07/03/2012 with a chief complaint of difficulty walking and right leg weakness. She has a history of hypertension and had been on 3 antihypertensive medications in the past. However, she had been noncompliant for over one year. In the emergency department, she was noted to be hypertensive with a blood pressure of 236/142, afebrile, and oxygenating 97% on room air. Her lab data were significant for hemoglobin of 16.2, platelet count of 129, normal cardiac enzymes, chest x-ray with that revealed no evidence of acute cardiopulmonary disease, and CT scan of the head that revealed multiple lacunar infarcts, age indeterminate but likely chronic. She was admitted for further evaluation and management.  HOSPITAL COURSE: Shortly after admission, the patient's son reported that the patient used crack cocaine. The patient later admitted that she did smoke crack cocaine. Nevertheless, urine drug screen was ordered. It revealed cocaine. She was started on aspirin therapy. IV labetalol was given followed by when necessary dosing until her urine drug screen revealed cocaine. It was subsequently discontinued in favor of as needed IV hydralazine. Aspirin was started at 325 mg daily. A nicotine patch was placed. She was strongly advised to stop smoking not only tobacco but cocaine permanently as it was likely a contributing factor to her stroke. For further evaluation, a number of studies were  ordered. The patient refused MRI imaging of her brain as she was extremely claustrophobic. Carotid  Duplex ultrasound revealed no ICA stenosis. 2-D echocardiogram revealed  moderate LVH, preserved LV function, and no significant valvular abnormalities. Fasting lipid profile revealed a total cholesterol of 150, triglycerides of 65, HDL cholesterol 62, and LDL cholesterol of 75. Her followup platelet count improved from 129 to 138. It is likely that her thrombocytopenia was secondary to vasospasm and malignant hypertension.  Over the course of the hospitalization, the patient voiced subjective improvement in her right leg and right arm strength. The occupational therapist evaluated her and recommended outpatient OT and a tub/shower seat. The shower seat was ordered. The physical therapist evaluated her. The therapist recommended outpatient PT and rolling walker with 5 inch wheels. The rolling walker was ordered. The followup evaluation by the physical therapist revealed that the patient was able to tolerate the therapeutic activities including transfer activities and short distance ambulation using a rolling walker. He felt that she would benefit from inpatient rehabilitation and eventual transition to home health physical therapy. However, the patient wanted to go home with therapy instead. Home health PT/OT and registered nurse were ordered. Her son or her daughter will be with her close to 24 hours a day at home over the next 2-3 days.   The patient was advised to stop smoking tobacco and crack cocaine permanently. The clinical social worker was consulted. She presented the patient and her family with resources to help her stop abusing substances. She and her family were receptive.  Prior to discharge, the patient was restarted on hydrochlorothiazide but at a lower dose of 12.5, rather than 25 mg daily which she had been treated with in the past. Metoprolol was restarted but at a lower dose of 25 mg twice a day rather than 100 mg that she had been treated with over a year ago. Lisinopril was added at 5 mg daily. Her blood pressure improved to the 0000000 to [id number]  systolically.  She was advised to followup at the free clinic at their next available appointment. She was advised to apply for short-term disability.    Discharge Exam: Blood pressure 145/81, pulse 89, temperature 98.1 F (36.7 C), temperature source Oral, resp. rate 24, height 5\' 6"  (1.676 m), weight 98.8 kg (217 lb 13 oz), SpO2 98.00%. Lungs: Clear to auscultation bilaterally. Next line heart: S1, S2, with a soft systolic murmur. Next line abdomen: Mildly obese, positive bowel sounds, soft, nontender, nondistended. Extremities: No pedal edema. Neurologic: She is alert and oriented x3. She has a mild right facial droop. No evidence of  dysarthria or dysphagia. Strength of the right upper treatment he is 4+ over 5 in strength of the right lower extremity is 4 minus over 5 in the sitting position. Strength on the left is 5 over 5 upper and lower extremity.    Discharge Orders    Future Orders Please Complete By Expires   Diet - low sodium heart healthy      Increase activity slowly      Discharge instructions      Comments:   Avoid using those substances that can cause another stroke, as discussed with you by Dr. Caryn Section. Called the [facility] clinic to make an appointment for hospital followup. Discuss applying for short-term disability with your primary care provider. Use a walker is much you can to avoid falls.      Follow-up Information    Follow up with [facility]. Call in 2  days. (To make an appointment for hospital followup)           Total discharge time: Greater than 35 minutes.  Signed: Adilenne Ashworth 07/04/2012, 1:52 PM

## 2012-07-06 NOTE — Progress Notes (Signed)
Spoke to [employer], son, who stated that [facility] had not contacted mother about [facility] stated that PT was to go out today and see Pt. Alfonzo notifed. Claretha Cooper D

## 2012-07-31 NOTE — Progress Notes (Signed)
Utilization Review complete 

## 2012-11-19 ENCOUNTER — Other Ambulatory Visit (HOSPITAL_COMMUNITY): Payer: Self-pay | Admitting: *Deleted

## 2012-11-19 ENCOUNTER — Ambulatory Visit (HOSPITAL_COMMUNITY)
Admission: RE | Admit: 2012-11-19 | Discharge: 2012-11-19 | Disposition: A | Discharge status: Home / Self Care | Payer: Medicaid Other | Source: Ambulatory Visit | Attending: *Deleted | Admitting: *Deleted

## 2012-11-19 DIAGNOSIS — Z139 Encounter for screening, unspecified: Secondary | ICD-10-CM

## 2012-11-19 DIAGNOSIS — Z1231 Encounter for screening mammogram for malignant neoplasm of breast: Secondary | ICD-10-CM | POA: Insufficient documentation

## 2013-07-04 ENCOUNTER — Encounter (HOSPITAL_COMMUNITY): Payer: Self-pay | Admitting: Emergency Medicine

## 2013-07-04 ENCOUNTER — Inpatient Hospital Stay (HOSPITAL_COMMUNITY)
Admission: EM | Admit: 2013-07-04 | Discharge: 2013-07-09 | DRG: 871 | Disposition: A | Discharge status: Home Health | Payer: Medicaid Other | Attending: Family Medicine | Admitting: Family Medicine

## 2013-07-04 ENCOUNTER — Emergency Department (HOSPITAL_COMMUNITY): Payer: Medicaid Other

## 2013-07-04 DIAGNOSIS — Z823 Family history of stroke: Secondary | ICD-10-CM

## 2013-07-04 DIAGNOSIS — I4891 Unspecified atrial fibrillation: Secondary | ICD-10-CM | POA: Diagnosis present

## 2013-07-04 DIAGNOSIS — N289 Disorder of kidney and ureter, unspecified: Secondary | ICD-10-CM

## 2013-07-04 DIAGNOSIS — A599 Trichomoniasis, unspecified: Secondary | ICD-10-CM | POA: Diagnosis present

## 2013-07-04 DIAGNOSIS — F17200 Nicotine dependence, unspecified, uncomplicated: Secondary | ICD-10-CM | POA: Diagnosis present

## 2013-07-04 DIAGNOSIS — J9600 Acute respiratory failure, unspecified whether with hypoxia or hypercapnia: Secondary | ICD-10-CM

## 2013-07-04 DIAGNOSIS — Z8673 Personal history of transient ischemic attack (TIA), and cerebral infarction without residual deficits: Secondary | ICD-10-CM

## 2013-07-04 DIAGNOSIS — N179 Acute kidney failure, unspecified: Secondary | ICD-10-CM

## 2013-07-04 DIAGNOSIS — I252 Old myocardial infarction: Secondary | ICD-10-CM

## 2013-07-04 DIAGNOSIS — E041 Nontoxic single thyroid nodule: Secondary | ICD-10-CM | POA: Diagnosis present

## 2013-07-04 DIAGNOSIS — N390 Urinary tract infection, site not specified: Secondary | ICD-10-CM

## 2013-07-04 DIAGNOSIS — A419 Sepsis, unspecified organism: Principal | ICD-10-CM

## 2013-07-04 DIAGNOSIS — J189 Pneumonia, unspecified organism: Secondary | ICD-10-CM

## 2013-07-04 DIAGNOSIS — Z66 Do not resuscitate: Secondary | ICD-10-CM | POA: Diagnosis present

## 2013-07-04 DIAGNOSIS — F1410 Cocaine abuse, uncomplicated: Secondary | ICD-10-CM | POA: Diagnosis present

## 2013-07-04 DIAGNOSIS — E119 Type 2 diabetes mellitus without complications: Secondary | ICD-10-CM

## 2013-07-04 DIAGNOSIS — I10 Essential (primary) hypertension: Secondary | ICD-10-CM

## 2013-07-04 HISTORY — DX: Type 2 diabetes mellitus without complications: E11.9

## 2013-07-04 LAB — CBC WITH DIFFERENTIAL/PLATELET
Basophils Relative: 0 % (ref 0–1)
Eosinophils Absolute: 0 10*3/uL (ref 0.0–0.7)
Eosinophils Relative: 0 % (ref 0–5)
HCT: 47.6 % — ABNORMAL HIGH (ref 36.0–46.0)
Hemoglobin: 16.6 g/dL — ABNORMAL HIGH (ref 12.0–15.0)
MCH: 31.3 pg (ref 26.0–34.0)
MCHC: 34.9 g/dL (ref 30.0–36.0)
MCV: 89.6 fL (ref 78.0–100.0)
Monocytes Absolute: 1.4 10*3/uL — ABNORMAL HIGH (ref 0.1–1.0)
Monocytes Relative: 7 % (ref 3–12)
Neutro Abs: 15.4 10*3/uL — ABNORMAL HIGH (ref 1.7–7.7)

## 2013-07-04 LAB — GLUCOSE, CAPILLARY: Glucose-Capillary: 203 mg/dL — ABNORMAL HIGH (ref 70–99)

## 2013-07-04 LAB — URINE MICROSCOPIC-ADD ON

## 2013-07-04 LAB — BASIC METABOLIC PANEL
BUN: 34 mg/dL — ABNORMAL HIGH (ref 6–23)
Chloride: 97 mEq/L (ref 96–112)
Creatinine, Ser: 3.38 mg/dL — ABNORMAL HIGH (ref 0.50–1.10)
GFR calc Af Amer: 17 mL/min — ABNORMAL LOW (ref >90)

## 2013-07-04 LAB — URINALYSIS, ROUTINE W REFLEX MICROSCOPIC: pH: 5 (ref 5.0–8.0)

## 2013-07-04 LAB — TROPONIN I: Troponin I: <0.3 ng/mL (ref <0.30)

## 2013-07-04 MED ORDER — ENOXAPARIN SODIUM 30 MG/0.3ML ~~LOC~~ SOLN
30.0000 mg | SUBCUTANEOUS | Status: DC
  Administered 2013-07-04: 30 mg via SUBCUTANEOUS
  Filled 2013-07-04: qty 0.3

## 2013-07-04 MED ORDER — ACETAMINOPHEN 325 MG PO TABS: 650.0000 mg | ORAL_TABLET | Freq: Four times a day (QID) | ORAL | Status: DC | PRN

## 2013-07-04 MED ORDER — SODIUM CHLORIDE 0.9 % IV SOLN: INTRAVENOUS | Status: DC

## 2013-07-04 MED ORDER — DEXTROSE 5 % IV SOLN
1.0000 g | Freq: Once | INTRAVENOUS | Status: AC
  Administered 2013-07-04: 1 g via INTRAVENOUS
  Filled 2013-07-04: qty 10

## 2013-07-04 MED ORDER — INSULIN ASPART 100 UNIT/ML ~~LOC~~ SOLN: 0.0000 [IU] | Freq: Every day | SUBCUTANEOUS | Status: DC

## 2013-07-04 MED ORDER — MORPHINE SULFATE 2 MG/ML IJ SOLN
2.0000 mg | INTRAMUSCULAR | Status: DC | PRN
Start: 2013-07-04 — End: 2013-07-09
  Administered 2013-07-05 – 2013-07-07 (×5): 2 mg via INTRAVENOUS
  Filled 2013-07-04 (×5): qty 1

## 2013-07-04 MED ORDER — GUAIFENESIN ER 600 MG PO TB12
1200.0000 mg | ORAL_TABLET | Freq: Two times a day (BID) | ORAL | Status: DC
  Administered 2013-07-04 – 2013-07-09 (×10): 1200 mg via ORAL
  Filled 2013-07-04 (×10): qty 2

## 2013-07-04 MED ORDER — DEXTROSE 5 % IV SOLN
500.0000 mg | INTRAVENOUS | Status: DC
  Administered 2013-07-05 – 2013-07-06 (×2): 500 mg via INTRAVENOUS
  Filled 2013-07-04 (×3): qty 500

## 2013-07-04 MED ORDER — INSULIN ASPART 100 UNIT/ML ~~LOC~~ SOLN
0.0000 [IU] | Freq: Three times a day (TID) | SUBCUTANEOUS | Status: DC
  Administered 2013-07-04: 5 [IU] via SUBCUTANEOUS
  Administered 2013-07-05 (×2): 2 [IU] via SUBCUTANEOUS
  Administered 2013-07-05: 3 [IU] via SUBCUTANEOUS
  Administered 2013-07-06 – 2013-07-07 (×4): 2 [IU] via SUBCUTANEOUS
  Administered 2013-07-07: 3 [IU] via SUBCUTANEOUS
  Administered 2013-07-08: 5 [IU] via SUBCUTANEOUS
  Administered 2013-07-08 (×2): 2 [IU] via SUBCUTANEOUS

## 2013-07-04 MED ORDER — ONDANSETRON HCL 4 MG/2ML IJ SOLN: 4.0000 mg | Freq: Four times a day (QID) | INTRAMUSCULAR | Status: DC | PRN

## 2013-07-04 MED ORDER — DEXTROSE 5 % IV SOLN
1.0000 g | INTRAVENOUS | Status: DC
  Administered 2013-07-05 – 2013-07-08 (×4): 1 g via INTRAVENOUS
  Filled 2013-07-04 (×6): qty 10

## 2013-07-04 MED ORDER — METRONIDAZOLE 500 MG PO TABS
2000.0000 mg | ORAL_TABLET | Freq: Once | ORAL | Status: AC
  Administered 2013-07-04: 2000 mg via ORAL
  Filled 2013-07-04: qty 4

## 2013-07-04 MED ORDER — ASPIRIN 325 MG PO TABS
325.0000 mg | ORAL_TABLET | Freq: Every day | ORAL | Status: DC
  Administered 2013-07-04 – 2013-07-09 (×6): 325 mg via ORAL
  Filled 2013-07-04 (×6): qty 1

## 2013-07-04 MED ORDER — ATORVASTATIN CALCIUM 10 MG PO TABS
10.0000 mg | ORAL_TABLET | Freq: Every day | ORAL | Status: DC
  Administered 2013-07-04 – 2013-07-08 (×5): 10 mg via ORAL
  Filled 2013-07-04 (×5): qty 1

## 2013-07-04 MED ORDER — KETOROLAC TROMETHAMINE 30 MG/ML IJ SOLN
30.0000 mg | Freq: Once | INTRAMUSCULAR | Status: AC
  Administered 2013-07-04: 30 mg via INTRAVENOUS
  Filled 2013-07-04: qty 1

## 2013-07-04 MED ORDER — INSULIN ASPART 100 UNIT/ML ~~LOC~~ SOLN: 0.0000 [IU] | Freq: Three times a day (TID) | SUBCUTANEOUS | Status: DC

## 2013-07-04 MED ORDER — HYDROCODONE-ACETAMINOPHEN 5-325 MG PO TABS
1.0000 | ORAL_TABLET | ORAL | Status: DC | PRN
  Administered 2013-07-04 – 2013-07-09 (×9): 1 via ORAL
  Filled 2013-07-04 (×10): qty 1

## 2013-07-04 MED ORDER — LORAZEPAM 2 MG/ML IJ SOLN
0.5000 mg | Freq: Once | INTRAMUSCULAR | Status: AC
  Administered 2013-07-04: 0.5 mg via INTRAVENOUS
  Filled 2013-07-04: qty 1

## 2013-07-04 MED ORDER — DEXTROSE 5 % IV SOLN
500.0000 mg | Freq: Once | INTRAVENOUS | Status: AC
  Administered 2013-07-04: 500 mg via INTRAVENOUS
  Filled 2013-07-04: qty 500

## 2013-07-04 MED ORDER — NICOTINE 21 MG/24HR TD PT24
21.0000 mg | MEDICATED_PATCH | Freq: Every day | TRANSDERMAL | Status: DC
  Administered 2013-07-04 – 2013-07-09 (×6): 21 mg via TRANSDERMAL
  Filled 2013-07-04 (×6): qty 1

## 2013-07-04 MED ORDER — SODIUM CHLORIDE 0.9 % IV SOLN
INTRAVENOUS | Status: DC
  Administered 2013-07-04 – 2013-07-05 (×2): via INTRAVENOUS
  Administered 2013-07-05: 1000 mL via INTRAVENOUS
  Administered 2013-07-06 (×2): via INTRAVENOUS

## 2013-07-04 NOTE — ED Notes (Signed)
Confirmed with lab that both blood cultures were done

## 2013-07-04 NOTE — ED Notes (Signed)
Pt c/o r side flank pain into r lower back.for one day. Pain worse with movement. Denies urinary changes. Pt moaning with each expiration and hyperventilating. Instructed on slow deep breathing. Denies sob just hurts to breath. Denies cough/congestion

## 2013-07-04 NOTE — ED Provider Notes (Signed)
History  This chart was scribed for Nat Christen, MD by Jenne Campus, ED Scribe. This patient was seen in room APA16A/APA16A and the patient's care was started at 10:56 AM.  CSN: [id number]  Arrival date & time 07/04/13  1047   First MD Initiated Contact with Patient 07/04/13 1056     Chief Complaint  Patient presents with  . Flank Pain    The history is provided by the patient. No language interpreter was used.    HPI Comments: Tracy [name] is a 52 y.o. female who presents to the Emergency Department complaining of 2 to 3 days of gradual onset, gradually worsening, constant SOB with associated right lateral chest wall pain that radiates into the right lower back started yesterday. Family member denies that the pt at baseline has trouble breathing and states that the pt at baseline uses a walker but could barely walk with assistance today. Pt denies cough and congestion as associated symptoms.  PCP is [place] is Dr. Merlene Laughter  Past Medical History  Diagnosis Date  . Hypertension   . Myocardial infarct, old   . Tobacco abuse   . Crack cocaine use   . Acute ischemic stroke 07/03/2012  . Hemiparesis, acute 07/04/2012  . Thrombocytopenia 07/03/2012  . LVH (left ventricular hypertrophy) 07/04/2012    Ejection fraction 60%.   Past Surgical History  Procedure Laterality Date  . Abdominal hysterectomy     History reviewed. No pertinent family history. History  Substance Use Topics  . Smoking status: Current Every Day Smoker    Types: Cigarettes  . Smokeless tobacco: Not on file  . Alcohol Use: No   No OB history provided.   Review of Systems  A complete 10 system review of systems was obtained and all systems are negative except as noted in the HPI and PMH.   Allergies  Review of patient's allergies indicates no known allergies.  Home Medications   Current Outpatient Rx  Name  Route  Sig  Dispense  Refill  . aspirin 325 MG tablet    Oral   Take 1 tablet (325 mg total) by mouth daily.         . hydrochlorothiazide (MICROZIDE) 12.5 MG capsule   Oral   Take 1 capsule (12.5 mg total) by mouth daily. For high blood pressure   30 capsule   2   . lisinopril (PRINIVIL,ZESTRIL) 5 MG tablet   Oral   Take 1 tablet (5 mg total) by mouth daily. For high blood pressure   30 tablet   2   . metoprolol tartrate (LOPRESSOR) 25 MG tablet   Oral   Take 1 tablet (25 mg total) by mouth 2 (two) times daily. For high blood pressure.   60 tablet   2    Triage Vitals: BP 144/86  Pulse 108  Temp(Src) 97.8 F (36.6 C) (Oral)  Resp 40  SpO2 100%  Physical Exam  Nursing note and vitals reviewed. Constitutional: She is oriented to person, place, and time. She appears well-developed and well-nourished.  Slightly dyspneic  HENT:  Head: Normocephalic and atraumatic.  Eyes: Conjunctivae and EOM are normal. Pupils are equal, round, and reactive to light.  Neck: Normal range of motion. Neck supple.  Cardiovascular: Normal rate, regular rhythm and normal heart sounds.   Pulmonary/Chest: She is in respiratory distress (mild).  Slightly tachypneic   Abdominal: Soft. Bowel sounds are normal.  Musculoskeletal: Normal range of motion.  Neurological: She is alert and oriented to  person, place, and time.  Skin: Skin is warm and dry.  Psychiatric: She has a normal mood and affect.    ED Course  Procedures (including critical care time)  Medications  azithromycin (ZITHROMAX) 500 mg in dextrose 5 % 250 mL IVPB (not administered)  cefTRIAXone (ROCEPHIN) 1 g in dextrose 5 % 50 mL IVPB (1 g Intravenous New Bag/Given 07/04/13 1229)    DIAGNOSTIC STUDIES: Oxygen Saturation is 90% on [place], adequate by my interpretation.    COORDINATION OF CARE: 12:13 PM-Discussed treatment plan which includes CT of head, CXR, CBC panel, BMP and UA with pt at bedside and pt agreed to plan. Advised pt and family that admission is probable.  Labs Reviewed   CBC WITH DIFFERENTIAL - Abnormal; Notable for the following:    WBC 19.3 (*)    RBC 5.31 (*)    Hemoglobin 16.6 (*)    HCT 47.6 (*)    Neutrophils Relative % 80 (*)    Neutro Abs 15.4 (*)    Monocytes Absolute 1.4 (*)    All other components within normal limits  BASIC METABOLIC PANEL - Abnormal; Notable for the following:    Glucose, Bld 216 (*)    BUN 34 (*)    Creatinine, Ser 3.38 (*)    GFR calc non Af Amer 15 (*)    GFR calc Af Amer 17 (*)    All other components within normal limits  URINALYSIS, ROUTINE W REFLEX MICROSCOPIC - Abnormal; Notable for the following:    Color, Urine AMBER (*)    APPearance HAZY (*)    Specific Gravity, Urine >1.030 (*)    Glucose, UA 100 (*)    Hgb urine dipstick TRACE (*)    Bilirubin Urine MODERATE (*)    Ketones, ur TRACE (*)    Protein, ur 30 (*)    Urobilinogen, UA 4.0 (*)    Nitrite POSITIVE (*)    Leukocytes, UA SMALL (*)    All other components within normal limits  URINE MICROSCOPIC-ADD ON - Abnormal; Notable for the following:    Squamous Epithelial / LPF MANY (*)    Bacteria, UA MANY (*)    All other components within normal limits  CULTURE, BLOOD (ROUTINE X 2)  CULTURE, BLOOD (ROUTINE X 2)  URINE CULTURE  TROPONIN I   Dg Chest Portable 1 View  07/04/2013   *RADIOLOGY REPORT*  Clinical Data: Short of breath, right upper quadrant pain  PORTABLE CHEST - 1 VIEW  Comparison: Prior chest x-ray 07/03/2012  Findings: Very low inspiratory volumes with bibasilar opacities more confluent on the right than the left.  Given the lower lung volumes, the degree of cardiomegaly is similar compared to prior. Pulmonary vascular crowding without overt edema.  Fullness in the right hilum.  IMPRESSION:  Very low inspiratory volumes with right greater than left bibasilar opacities.  The dense peripheral opacity in the right lower lobe may represent pneumonia.  However, given the appearance of fullness in the right hilum, an obstructing central mass and  associated peripheral postobstructive changes is difficult to exclude on this single view. Consider further evaluation with CT scan of the chest.  Stable cardiomegaly.   Original Report Authenticated By: Jacqulynn Cadet, M.D.  Ct Chest Wo Contrast  07/04/2013   *RADIOLOGY REPORT*  Clinical Data: Evaluate lung densities.  CT CHEST WITHOUT CONTRAST  Technique:  Multidetector CT imaging of the chest was performed following the standard protocol without IV contrast.  Comparison: Chest radiograph 07/04/2013  Findings:  Low density structure in the left thyroid lobe is suggestive for a nodule.  Right precarinal lymph node measures 1.6 cm in the short axis. Difficult to evaluate for hilar adenopathy due to respiratory artifact and lack of intravenous contrast. Heart size is enlarged.  No significant pericardial or pleural fluid.  Low attenuation of the liver is suggestive for steatosis.  Limited evaluation of the right adrenal gland.  Trachea and mainstem bronchi are patent.  Patchy airspace disease in the right lower lobe and right middle lobe.  A small amount of airspace disease in the posterior right upper lobe.  There is emphysema in the upper lobes.  No focal disease in the left lung. Multilevel degenerative changes in the thoracic spine.  IMPRESSION: Patchy airspace disease throughout the right lung.  Findings are most compatible with multifocal pneumonia.  Emphysema.  Mild mediastinal lymphadenopathy which is nonspecific and poorly characterized on this on noncontrast examination.  Consider follow- up of this finding.  Left thyroid nodule.  This could be further evaluated with a non emergent thyroid ultrasound.  Low density of the liver raises the possibility of hepatic steatosis.   Original Report Authenticated By: Markus Daft, M.D.   Dg Chest Portable 1 View  07/04/2013   *RADIOLOGY REPORT*  Clinical Data: Short of breath, right upper quadrant pain  PORTABLE CHEST - 1 VIEW  Comparison: Prior chest x-ray  07/03/2012  Findings: Very low inspiratory volumes with bibasilar opacities more confluent on the right than the left.  Given the lower lung volumes, the degree of cardiomegaly is similar compared to prior. Pulmonary vascular crowding without overt edema.  Fullness in the right hilum.  IMPRESSION:  Very low inspiratory volumes with right greater than left bibasilar opacities.  The dense peripheral opacity in the right lower lobe may represent pneumonia.  However, given the appearance of fullness in the right hilum, an obstructing central mass and associated peripheral postobstructive changes is difficult to exclude on this single view. Consider further evaluation with CT scan of the chest.  Stable cardiomegaly.   Original Report Authenticated By: Jacqulynn Cadet, M.D.    No diagnosis found.   CRITICAL CARE Performed by: Nat Christen Total critical care time: 30 Critical care time was exclusive of separately billable procedures and treating other patients. Critical care was necessary to treat or prevent imminent or life-threatening deterioration. Critical care was time spent personally by me on the following activities: development of treatment plan with patient and/or surrogate as well as nursing, discussions with consultants, evaluation of patient's response to treatment, examination of patient, obtaining history from patient or surrogate, ordering and performing treatments and interventions, ordering and review of laboratory studies, ordering and review of radiographic studies, pulse oximetry and re-evaluation of patient's condition. MDM   Chest x-ray and CT scan confirm right-sided pneumonia with hypoxemia.  Patient also has urinary tract infection and elevated creatinine. Will hydrate, Rx IV Rocephin, IV Zithromax, supplemental oxygen.  Admit to Dr. Roderic Palau    I personally performed the services described in this documentation, which was scribed in my presence. The recorded information has been  reviewed and is accurate.    Nat Christen, MD 07/04/13 [phone]

## 2013-07-04 NOTE — ED Notes (Signed)
O2 placed via [place] at 2 L/M, pt refuses to leave in place

## 2013-07-04 NOTE — Code Documentation (Signed)
Patient want to change code stats to full code dr. Darrick Meigs notified

## 2013-07-04 NOTE — Code Documentation (Deleted)
Fraser Din wants to change code stats to full code dr. Arline Asp notified

## 2013-07-04 NOTE — ED Notes (Signed)
EDP at bedside  

## 2013-07-04 NOTE — H&P (Signed)
[facility] History and Physical  Tracy [name] [id number] DOB: 04/15/1961 DOA: 07/04/2013  Referring physician:  Nat Christen, emergency room physician  PCP: Raiford Simmonds., PA-C  Specialists:   Chief Complaint: shortness of breath   HPI: Tracy [name] is a 52 y.o. female with prior history of stroke who presents to the emergency room with shortness of breath. Patient reports onset of symptoms occurring approximately 2-3 days ago. She has associated right-sided pleuritic chest pain. She has had a cough but has not been producing much sputum. She reports feeling feverish last night. She's not had any significant nausea, vomiting or diarrhea. No new rashes. She's not had any sick contacts. This is worse on exertion. She does not have any exertional chest pain. She was evaluated in the emergency room and noted to have a acute right-sided multifocal pneumonia. She was also noted to have a UTI and acute renal failure. She feels that she has been urinating less. She denies any dysuria, pyuria, hematuria. She's been referred for admission.   Review of Systems: pertinent positives as per history of present illness, otherwise negative   Past Medical History  Diagnosis Date  . Hypertension   . Myocardial infarct, old   . Tobacco abuse   . Crack cocaine use   . Acute ischemic stroke 07/03/2012  . Hemiparesis, acute 07/04/2012  . Thrombocytopenia 07/03/2012  . LVH (left ventricular hypertrophy) 07/04/2012    Ejection fraction 60%.  . Diabetes   . Diabetes mellitus    Past Surgical History  Procedure Laterality Date  . Abdominal hysterectomy     Social History:  reports that she has been smoking Cigarettes.  She has been smoking about 0.00 packs per day. She does not have any smokeless tobacco history on file. She reports that she uses illicit drugs. She reports that she does not drink alcohol.  lives independently  No Known Allergies  Family history: Father died of a stroke,  mother died from ruptured cerebral aneurysm  Prior to Admission medications   Medication Sig Start Date End Date Taking? Authorizing Provider  amLODipine (NORVASC) 10 MG tablet Take 10 mg by mouth daily.   Yes Historical Provider, MD  aspirin 325 MG tablet Take 1 tablet (325 mg total) by mouth daily. 07/04/12 07/04/13 Yes Rexene Alberts, MD  atorvastatin (LIPITOR) 10 MG tablet Take 10 mg by mouth at bedtime.   Yes Historical Provider, MD  cloNIDine (CATAPRES) 0.2 MG tablet Take 0.2 mg by mouth 2 (two) times daily.   Yes Historical Provider, MD  lisinopril-hydrochlorothiazide (PRINZIDE,ZESTORETIC) 20-12.5 MG per tablet Take 2 tablets by mouth daily.   Yes Historical Provider, MD  metFORMIN (GLUCOPHAGE-XR) 500 MG 24 hr tablet Take 500 mg by mouth daily.   Yes Historical Provider, MD  metoprolol tartrate (LOPRESSOR) 25 MG tablet Take 12.5 mg by mouth 2 (two) times daily. For high blood pressure. 07/04/12 07/04/13 Yes Rexene Alberts, MD  traMADol (ULTRAM) 50 MG tablet Take 50 mg by mouth every 6 (six) hours as needed for pain.   Yes Historical Provider, MD   Physical Exam: Filed Vitals:   07/04/13 1400 07/04/13 1450 07/04/13 1451 07/04/13 1545  BP: 149/74   128/87  Pulse: 83   80  Temp:    97.9 F (36.6 C)  TempSrc:    Oral  Resp:  23 25   Height:    5\' 7"  (1.702 m)  Weight:    106.3 kg (234 lb 5.6 oz)  SpO2: 96%   97%  General:  No acute distress  Eyes: Pupils are equal round react to light, extraocular motions are intact  ENT: Mucous membranes are dry  Neck: Supple  Cardiovascular: S1, S2, regular rate and rhythm  Respiratory: Crackles in the right side  Abdomen: Soft, obese, tender in the right side, positive bowel sounds  Skin: No rashes  Musculoskeletal: No pedal edema bilaterally  Psychiatric: Normal affect, cooperative with exam  Neurologic: Grossly intact, nonfocal    Labs on Admission:  Basic Metabolic Panel:  Recent Labs Lab 07/04/13 1120  NA 138  K 3.6  CL  97  CO2 22  GLUCOSE 216*  BUN 34*  CREATININE 3.38*  CALCIUM 10.0   Liver Function Tests: No results found for this basename: AST, ALT, ALKPHOS, BILITOT, PROT, ALBUMIN,  in the last 168 hours No results found for this basename: LIPASE, AMYLASE,  in the last 168 hours No results found for this basename: AMMONIA,  in the last 168 hours CBC:  Recent Labs Lab 07/04/13 1120  WBC 19.3*  NEUTROABS 15.4*  HGB 16.6*  HCT 47.6*  MCV 89.6  PLT 163   Cardiac Enzymes:  Recent Labs Lab 07/04/13 1120  TROPONINI <0.30    BNP (last 3 results) No results found for this basename: PROBNP,  in the last 8760 hours CBG:  Recent Labs Lab 07/04/13 1618  GLUCAP 203*    Radiological Exams on Admission: Ct Chest Wo Contrast  07/04/2013   *RADIOLOGY REPORT*  Clinical Data: Evaluate lung densities.  CT CHEST WITHOUT CONTRAST  Technique:  Multidetector CT imaging of the chest was performed following the standard protocol without IV contrast.  Comparison: Chest radiograph 07/04/2013  Findings: Low density structure in the left thyroid lobe is suggestive for a nodule.  Right precarinal lymph node measures 1.6 cm in the short axis. Difficult to evaluate for hilar adenopathy due to respiratory artifact and lack of intravenous contrast. Heart size is enlarged.  No significant pericardial or pleural fluid.  Low attenuation of the liver is suggestive for steatosis.  Limited evaluation of the right adrenal gland.  Trachea and mainstem bronchi are patent.  Patchy airspace disease in the right lower lobe and right middle lobe.  A small amount of airspace disease in the posterior right upper lobe.  There is emphysema in the upper lobes.  No focal disease in the left lung. Multilevel degenerative changes in the thoracic spine.  IMPRESSION: Patchy airspace disease throughout the right lung.  Findings are most compatible with multifocal pneumonia.  Emphysema.  Mild mediastinal lymphadenopathy which is nonspecific and  poorly characterized on this on noncontrast examination.  Consider follow- up of this finding.  Left thyroid nodule.  This could be further evaluated with a non emergent thyroid ultrasound.  Low density of the liver raises the possibility of hepatic steatosis.   Original Report Authenticated By: Markus Daft, M.D.   Dg Chest Portable 1 View  07/04/2013   *RADIOLOGY REPORT*  Clinical Data: Short of breath, right upper quadrant pain  PORTABLE CHEST - 1 VIEW  Comparison: Prior chest x-ray 07/03/2012  Findings: Very low inspiratory volumes with bibasilar opacities more confluent on the right than the left.  Given the lower lung volumes, the degree of cardiomegaly is similar compared to prior. Pulmonary vascular crowding without overt edema.  Fullness in the right hilum.  IMPRESSION:  Very low inspiratory volumes with right greater than left bibasilar opacities.  The dense peripheral opacity in the right lower lobe may represent pneumonia.  However,  given the appearance of fullness in the right hilum, an obstructing central mass and associated peripheral postobstructive changes is difficult to exclude on this single view. Consider further evaluation with CT scan of the chest.  Stable cardiomegaly.   Original Report Authenticated By: Jacqulynn Cadet, M.D.    EKG: Independently reviewed.  sinus rhythm with right bundle branch block   Assessment/Plan Active Problems:   CAP (community acquired pneumonia)   Sepsis   Acute renal failure   Left thyroid nodule   Unspecified essential hypertension   Diabetes   Acute respiratory failure   1.  sepsis due to community acquired pneumonia. Treat with ceftriaxone and azithromycin. Continue supportive oxygen weaned as tolerated. Blood cultures have been sent, follow up sputum cultures and urinary antigens.  2.  urinary tract infection. Rocephin should adequately cover the urine. She was also found to have Trichomonas in her urine. This which revealed a single dose of  Flagyl. Followup urine culture. 3. Acute renal failure. Possibly due to volume depletion in the setting of ACE inhibitor use. We'll hold ACE inhibitors, continue with hydration, check renal ultrasound 4. Hypertension. Continue to hold antihypertensives until acute sepsis has resolved. 5. Diabetes. Sliding scale insulin 6. left thyroid nodule. This will be further evaluation with thyroid ultrasound, this can be done electively as an outpatient.    Code Status:  DO NOT RESUSCITATE  Family Communication:  no family present, discussed the patient Disposition Plan: discharged home once improved   Time spent: 49mins  Tracy [name] [facility] Pager [phone]  If 7PM-7AM, please contact night-coverage [email or web address] Password [email or web address] 07/04/2013, 5:09 PM

## 2013-07-05 ENCOUNTER — Inpatient Hospital Stay (HOSPITAL_COMMUNITY): Payer: Medicaid Other

## 2013-07-05 DIAGNOSIS — E119 Type 2 diabetes mellitus without complications: Secondary | ICD-10-CM

## 2013-07-05 LAB — CBC
HCT: 43.2 % (ref 36.0–46.0)
MCHC: 35.4 g/dL (ref 30.0–36.0)
MCV: 88.9 fL (ref 78.0–100.0)
Platelets: 124 10*3/uL — ABNORMAL LOW (ref 150–400)
RDW: 13.5 % (ref 11.5–15.5)
WBC: 14.7 10*3/uL — ABNORMAL HIGH (ref 4.0–10.5)

## 2013-07-05 LAB — COMPREHENSIVE METABOLIC PANEL
ALT: 33 U/L (ref 0–35)
Albumin: 2.8 g/dL — ABNORMAL LOW (ref 3.5–5.2)
Alkaline Phosphatase: 142 U/L — ABNORMAL HIGH (ref 39–117)
BUN: 40 mg/dL — ABNORMAL HIGH (ref 6–23)
Chloride: 97 mEq/L (ref 96–112)
GFR calc Af Amer: 21 mL/min — ABNORMAL LOW (ref >90)
Glucose, Bld: 149 mg/dL — ABNORMAL HIGH (ref 70–99)
Potassium: 3.1 mEq/L — ABNORMAL LOW (ref 3.5–5.1)
Sodium: 136 mEq/L (ref 135–145)
Total Bilirubin: 1 mg/dL (ref 0.3–1.2)
Total Protein: 6.5 g/dL (ref 6.0–8.3)

## 2013-07-05 LAB — URINE CULTURE

## 2013-07-05 LAB — PROTIME-INR: INR: 1.3 (ref 0.00–1.49)

## 2013-07-05 LAB — GLUCOSE, CAPILLARY
Glucose-Capillary: 163 mg/dL — ABNORMAL HIGH (ref 70–99)
Glucose-Capillary: 130 mg/dL — ABNORMAL HIGH (ref 70–99)
Glucose-Capillary: 128 mg/dL — ABNORMAL HIGH (ref 70–99)

## 2013-07-05 LAB — HIV ANTIBODY (ROUTINE TESTING W REFLEX): HIV: NONREACTIVE

## 2013-07-05 LAB — TROPONIN I: Troponin I: <0.3 ng/mL (ref <0.30)

## 2013-07-05 LAB — MAGNESIUM: Magnesium: 1.7 mg/dL (ref 1.5–2.5)

## 2013-07-05 MED ORDER — POTASSIUM CHLORIDE CRYS ER 20 MEQ PO TBCR
40.0000 meq | EXTENDED_RELEASE_TABLET | Freq: Once | ORAL | Status: AC
  Administered 2013-07-05: 40 meq via ORAL
  Filled 2013-07-05: qty 2

## 2013-07-05 MED ORDER — DEXTROSE 5 % IV SOLN
5.0000 mg/h | INTRAVENOUS | Status: DC
  Administered 2013-07-05: 5 mg/h via INTRAVENOUS
  Administered 2013-07-05: 10 mg/h via INTRAVENOUS
  Filled 2013-07-05: qty 100

## 2013-07-05 MED ORDER — ENOXAPARIN SODIUM 60 MG/0.6ML ~~LOC~~ SOLN
60.0000 mg | Freq: Once | SUBCUTANEOUS | Status: AC
  Administered 2013-07-05: 60 mg via SUBCUTANEOUS
  Filled 2013-07-05: qty 0.6

## 2013-07-05 MED ORDER — POTASSIUM CHLORIDE CRYS ER 20 MEQ PO TBCR
30.0000 meq | EXTENDED_RELEASE_TABLET | Freq: Once | ORAL | Status: AC
  Administered 2013-07-05: 30 meq via ORAL
  Filled 2013-07-05: qty 1

## 2013-07-05 MED ORDER — ENOXAPARIN SODIUM 120 MG/0.8ML ~~LOC~~ SOLN
1.0000 mg/kg | SUBCUTANEOUS | Status: DC
  Administered 2013-07-05: 105 mg via SUBCUTANEOUS
  Filled 2013-07-05 (×2): qty 0.8

## 2013-07-05 MED ORDER — POTASSIUM CHLORIDE 10 MEQ/100ML IV SOLN
10.0000 meq | INTRAVENOUS | Status: DC
  Filled 2013-07-05: qty 200

## 2013-07-05 MED ORDER — METOPROLOL TARTRATE 25 MG PO TABS
25.0000 mg | ORAL_TABLET | Freq: Two times a day (BID) | ORAL | Status: DC
  Administered 2013-07-05 – 2013-07-09 (×9): 25 mg via ORAL
  Filled 2013-07-05 (×9): qty 1

## 2013-07-05 MED ORDER — SODIUM CHLORIDE 0.9 % IV BOLUS (SEPSIS): 500.0000 mL | Freq: Once | INTRAVENOUS | Status: DC

## 2013-07-05 NOTE — Progress Notes (Signed)
UR chart review completed.  

## 2013-07-05 NOTE — Progress Notes (Addendum)
Called by nurse that patient went into Afib with [facility], patient admitted with CAP, UTI, says she has remote history of A fib, denies chest pain.  Exam : Heart  S1s2 irregular, Chest ; Clear bilaterally Plan:  Her CHADs2 score is 5, will need anticoagulation  Start Cardizem drip.  Lovenox per pharmacy.  Obtain cardiac enzymes x 3

## 2013-07-05 NOTE — Care Management Note (Unsigned)
    Page 1 of 1   07/05/2013     11:50:31 AM   CARE MANAGEMENT NOTE 07/05/2013  Patient:  Tracy [name], Tracy [name]   Account Number:  [id number]  Date Initiated:  07/05/2013  Documentation initiated by:  Theophilus Kinds  Subjective/Objective Assessment:   Pt admitted from home with sepsis, pneumonia, and UTI. Pt lives alone but has a daughter who is her CAP aide that is very active in the care of the pt. Pt has a walker for home use.     Action/Plan:   Will continue to follow for [facility] needs. Pt may need PT consult once heart rate is better controlled.   Anticipated DC Date:  07/09/2013   Anticipated DC Plan:  [place]  CM consult      Choice offered to / List presented to:             Status of service:  In process, will continue to follow Medicare Important Message given?   (If response is "NO", the following Medicare IM given date fields will be blank) Date Medicare IM given:   Date Additional Medicare IM given:    Discharge Disposition:    Per UR Regulation:    If discussed at Long Length of Stay Meetings, dates discussed:    Comments:  07/05/13 [place], RN BSN CM

## 2013-07-05 NOTE — Progress Notes (Signed)
[facility] PROGRESS NOTE  Tracy [name] [id number] DOB: Oct 11, 1961 DOA: 07/04/2013 PCP: MUSE,ROCHELLE D., PA-C  Assessment/Plan: sepsis due to community acquired pneumonia. Treating with ceftriaxone and azithromycin. Continue supportive oxygen and wean as tolerated. Blood cultures have been sent, follow up sputum cultures and urinary antigens.   urinary tract infection. Rocephin should adequately cover the urine. She was also found to have Trichomonas in her urine. She was treated with a single dose of Flagyl 2grams. Followup urine culture.   Acute renal failure. Possibly due to volume depletion in the setting of ACE inhibitor use. We'll hold ACE inhibitors, continue with hydration, check renal ultrasound.  Creatinine is improving from yesterday   Hypertension. Continue to hold antihypertensives until acute sepsis has resolved.   Diabetes. Sliding scale insulin left   thyroid nodule. This can be further evaluated with thyroid ultrasound, this can be done electively as an outpatient.  Atrial fibrillation with RVR.  Patient had episode of atrial fib overnight requiring cardizem infusion.  Due to her history of stroke, she was started on anticoagulation with lovenox. She has since converted to sinus rhythm.  Will restart outpatient lopressor. Will continue to follow.   Code Status: full code Family Communication: discussed with patient and family at bedside Disposition Plan: discharge home once improved   Consultants:  none  Procedures:  none  Antibiotics:  Rocephin 7/6  azithro 7/6  HPI/Subjective: No new complaints, had some hemoptysis today.  Still short of breath.  Objective: Filed Vitals:   07/05/13 1000 07/05/13 1015 07/05/13 1030 07/05/13 1141  BP: 127/80 111/62 123/93   Pulse: 83 79 82   Temp:    98.1 F (36.7 C)  TempSrc:    Oral  Resp: 27 29 26    Height:      Weight:      SpO2: 95% 95% 97%     Intake/Output Summary (Last 24 hours) at  07/05/13 1217 Last data filed at 07/05/13 1000  Gross per 24 hour  Intake 2204.75 ml  Output    900 ml  Net 1304.75 ml   Filed Weights   07/04/13 1545 07/05/13 0500  Weight: 106.3 kg (234 lb 5.6 oz) 106.9 kg (235 lb 10.8 oz)    Exam:   General:  Increased resp effort  Cardiovascular: s1, s2, rrr  Respiratory: crackles at bases  Abdomen: soft, nt, nd, bs+  Musculoskeletal: no edema b/l   Data Reviewed: Basic Metabolic Panel:  Recent Labs Lab 07/04/13 1120 07/05/13 0208  NA 138 136  K 3.6 3.1*  CL 97 97  CO2 22 25  GLUCOSE 216* 149*  BUN 34* 40*  CREATININE 3.38* 2.86*  CALCIUM 10.0 8.9   Liver Function Tests:  Recent Labs Lab 07/05/13 0208  AST 39*  ALT 33  ALKPHOS 142*  BILITOT 1.0  PROT 6.5  ALBUMIN 2.8*   No results found for this basename: LIPASE, AMYLASE,  in the last 168 hours No results found for this basename: AMMONIA,  in the last 168 hours CBC:  Recent Labs Lab 07/04/13 1120 07/05/13 0208  WBC 19.3* 14.7*  NEUTROABS 15.4*  --   HGB 16.6* 15.3*  HCT 47.6* 43.2  MCV 89.6 88.9  PLT 163 124*   Cardiac Enzymes:  Recent Labs Lab 07/04/13 1120 07/05/13 0208 07/05/13 0743  TROPONINI <0.30 <0.30 <0.30   BNP (last 3 results) No results found for this basename: PROBNP,  in the last 8760 hours CBG:  Recent Labs Lab 07/04/13 1618 07/04/13 2103 07/05/13 [id number]  07/05/13 1109  GLUCAP 203* 148* 163* 130*    Recent Results (from the past 240 hour(s))  CULTURE, BLOOD (ROUTINE X 2)     Status: None   Collection Time    07/04/13 12:10 PM      Result Value Range Status   Specimen Description BLOOD   Final   Special Requests     Final   Value: BOTTLES DRAWN AEROBIC AND ANAEROBIC BLOOD RIGHT ARM 8CC   Culture NO GROWTH 1 DAY   Final   Report Status PENDING   Incomplete  CULTURE, BLOOD (ROUTINE X 2)     Status: None   Collection Time    07/04/13 12:20 PM      Result Value Range Status   Specimen Description BLOOD   Final   Special  Requests     Final   Value: BOTTLES DRAWN AEROBIC AND ANAEROBIC BLOOD RIGHT HAND 8CC   Culture NO GROWTH 1 DAY   Final   Report Status PENDING   Incomplete  MRSA PCR SCREENING     Status: None   Collection Time    07/04/13  3:46 PM      Result Value Range Status   MRSA by PCR NEGATIVE  NEGATIVE Final   Comment:            The GeneXpert MRSA Assay (FDA     approved for NASAL specimens     only), is one component of a     comprehensive MRSA colonization     surveillance program. It is not     intended to diagnose MRSA     infection nor to guide or     monitor treatment for     MRSA infections.     Studies: Ct Chest Wo Contrast  07/04/2013   *RADIOLOGY REPORT*  Clinical Data: Evaluate lung densities.  CT CHEST WITHOUT CONTRAST  Technique:  Multidetector CT imaging of the chest was performed following the standard protocol without IV contrast.  Comparison: Chest radiograph 07/04/2013  Findings: Low density structure in the left thyroid lobe is suggestive for a nodule.  Right precarinal lymph node measures 1.6 cm in the short axis. Difficult to evaluate for hilar adenopathy due to respiratory artifact and lack of intravenous contrast. Heart size is enlarged.  No significant pericardial or pleural fluid.  Low attenuation of the liver is suggestive for steatosis.  Limited evaluation of the right adrenal gland.  Trachea and mainstem bronchi are patent.  Patchy airspace disease in the right lower lobe and right middle lobe.  A small amount of airspace disease in the posterior right upper lobe.  There is emphysema in the upper lobes.  No focal disease in the left lung. Multilevel degenerative changes in the thoracic spine.  IMPRESSION: Patchy airspace disease throughout the right lung.  Findings are most compatible with multifocal pneumonia.  Emphysema.  Mild mediastinal lymphadenopathy which is nonspecific and poorly characterized on this on noncontrast examination.  Consider follow- up of this finding.   Left thyroid nodule.  This could be further evaluated with a non emergent thyroid ultrasound.  Low density of the liver raises the possibility of hepatic steatosis.   Original Report Authenticated By: Markus Daft, M.D.   Dg Chest Portable 1 View  07/04/2013   *RADIOLOGY REPORT*  Clinical Data: Short of breath, right upper quadrant pain  PORTABLE CHEST - 1 VIEW  Comparison: Prior chest x-ray 07/03/2012  Findings: Very low inspiratory volumes with bibasilar opacities more confluent on the  right than the left.  Given the lower lung volumes, the degree of cardiomegaly is similar compared to prior. Pulmonary vascular crowding without overt edema.  Fullness in the right hilum.  IMPRESSION:  Very low inspiratory volumes with right greater than left bibasilar opacities.  The dense peripheral opacity in the right lower lobe may represent pneumonia.  However, given the appearance of fullness in the right hilum, an obstructing central mass and associated peripheral postobstructive changes is difficult to exclude on this single view. Consider further evaluation with CT scan of the chest.  Stable cardiomegaly.   Original Report Authenticated By: Jacqulynn Cadet, M.D.    Scheduled Meds: . aspirin  325 mg Oral Daily  . atorvastatin  10 mg Oral QHS  . azithromycin  500 mg Intravenous Q24H  . cefTRIAXone (ROCEPHIN)  IV  1 g Intravenous Q24H  . enoxaparin (LOVENOX) injection  1 mg/kg Subcutaneous Q24H  . guaiFENesin  1,200 mg Oral BID  . insulin aspart  0-15 Units Subcutaneous TID WC  . insulin aspart  0-5 Units Subcutaneous QHS  . nicotine  21 mg Transdermal Daily  . [DISCONTINUED] sodium chloride   Intravenous STAT   Continuous Infusions: . sodium chloride 75 mL/hr at 07/05/13 1000  . diltiazem (CARDIZEM) infusion 5 mg/hr (07/05/13 1010)    Active Problems:   CAP (community acquired pneumonia)   Sepsis   Acute renal failure   Left thyroid nodule   Unspecified essential hypertension   Diabetes   Acute  respiratory failure    Time spent: 40mins    MEMON,JEHANZEB  [facility] Pager [phone]. If 7PM-7AM, please contact night-coverage at [email or web address], password [facility] 07/05/2013, 12:17 PM  LOS: 1 day

## 2013-07-05 NOTE — Progress Notes (Signed)
[place] for Lovenox Indication: atrial fibrillation  No Known Allergies  Patient Measurements: Height: 5\' 7"  (170.2 cm) Weight: 235 lb 10.8 oz (106.9 kg) IBW/kg (Calculated) : 61.6  Vital Signs: Temp: 97.9 F (36.6 C) (07/07 0749) Temp src: Oral (07/07 0749) BP: 109/63 mmHg (07/07 0800) Pulse Rate: 73 (07/07 0800)  Labs:  Recent Labs  07/04/13 1120 07/05/13 0208 07/05/13 0743  HGB 16.6* 15.3*  --   HCT 47.6* 43.2  --   PLT 163 124*  --   CREATININE 3.38* 2.86*  --   TROPONINI <0.30 <0.30 <0.30    Estimated Creatinine Clearance: 29 ml/min (by C-G formula based on Cr of 2.86).   Medical History: Past Medical History  Diagnosis Date  . Hypertension   . Myocardial infarct, old   . Tobacco abuse   . Crack cocaine use   . Acute ischemic stroke 07/03/2012  . Hemiparesis, acute 07/04/2012  . Thrombocytopenia 07/03/2012  . LVH (left ventricular hypertrophy) 07/04/2012    Ejection fraction 60%.  . Diabetes   . Diabetes mellitus     Medications:  Prescriptions prior to admission  Medication Sig Dispense Refill  . amLODipine (NORVASC) 10 MG tablet Take 10 mg by mouth daily.      . [EXPIRED] aspirin 325 MG tablet Take 1 tablet (325 mg total) by mouth daily.      [name] atorvastatin (LIPITOR) 10 MG tablet Take 10 mg by mouth at bedtime.      . cloNIDine (CATAPRES) 0.2 MG tablet Take 0.2 mg by mouth 2 (two) times daily.      [name] lisinopril-hydrochlorothiazide (PRINZIDE,ZESTORETIC) 20-12.5 MG per tablet Take 2 tablets by mouth daily.      . metFORMIN (GLUCOPHAGE-XR) 500 MG 24 hr tablet Take 500 mg by mouth daily.      . metoprolol tartrate (LOPRESSOR) 25 MG tablet Take 12.5 mg by mouth 2 (two) times daily. For high blood pressure.      . traMADol (ULTRAM) 50 MG tablet Take 50 mg by mouth every 6 (six) hours as needed for pain.        Assessment: Okay for Protocol Estimated Creatinine Clearance: 29 ml/min (by C-G formula based on Cr of  2.86). Patient received a total of 90mg  Lovenox last night.  Goal of Therapy:  Anti-Xa level 0.6-1.2 units/ml 4hrs after LMWH dose given if checked. Monitor platelets by anticoagulation protocol: Yes   Plan:  Lovenox 1mg /kg every 24 hours (adjusted to renal function). CBC at least every 72 hours. Monitor renal function F/U long term anti-coag plans.  Pricilla Larsson 07/05/2013,8:35 AM

## 2013-07-06 ENCOUNTER — Inpatient Hospital Stay (HOSPITAL_COMMUNITY): Payer: Medicaid Other

## 2013-07-06 LAB — BASIC METABOLIC PANEL
Calcium: 9.2 mg/dL (ref 8.4–10.5)
Chloride: 104 mEq/L (ref 96–112)
Creatinine, Ser: 1.34 mg/dL — ABNORMAL HIGH (ref 0.50–1.10)
GFR calc Af Amer: 52 mL/min — ABNORMAL LOW (ref >90)
Sodium: 139 mEq/L (ref 135–145)

## 2013-07-06 LAB — GLUCOSE, CAPILLARY
Glucose-Capillary: 133 mg/dL — ABNORMAL HIGH (ref 70–99)
Glucose-Capillary: 129 mg/dL — ABNORMAL HIGH (ref 70–99)
Glucose-Capillary: 168 mg/dL — ABNORMAL HIGH (ref 70–99)

## 2013-07-06 MED ORDER — ENOXAPARIN SODIUM 120 MG/0.8ML ~~LOC~~ SOLN
120.0000 mg | Freq: Two times a day (BID) | SUBCUTANEOUS | Status: DC
  Administered 2013-07-06: 120 mg via SUBCUTANEOUS
  Filled 2013-07-06 (×3): qty 0.8

## 2013-07-06 MED ORDER — IPRATROPIUM BROMIDE 0.02 % IN SOLN
0.5000 mg | RESPIRATORY_TRACT | Status: DC
  Administered 2013-07-06 – 2013-07-09 (×17): 0.5 mg via RESPIRATORY_TRACT
  Filled 2013-07-06 (×17): qty 2.5

## 2013-07-06 MED ORDER — VANCOMYCIN HCL 10 G IV SOLR
1500.0000 mg | Freq: Two times a day (BID) | INTRAVENOUS | Status: DC
  Administered 2013-07-06 – 2013-07-08 (×4): 1500 mg via INTRAVENOUS
  Filled 2013-07-06 (×6): qty 1500

## 2013-07-06 MED ORDER — WARFARIN - PHARMACIST DOSING INPATIENT: Status: DC

## 2013-07-06 MED ORDER — WARFARIN SODIUM 7.5 MG PO TABS
7.5000 mg | ORAL_TABLET | Freq: Once | ORAL | Status: AC
  Administered 2013-07-06: 7.5 mg via ORAL
  Filled 2013-07-06: qty 1

## 2013-07-06 MED ORDER — ALBUTEROL SULFATE (5 MG/ML) 0.5% IN NEBU
2.5000 mg | INHALATION_SOLUTION | RESPIRATORY_TRACT | Status: DC
  Administered 2013-07-06 – 2013-07-09 (×17): 2.5 mg via RESPIRATORY_TRACT
  Filled 2013-07-06 (×17): qty 0.5

## 2013-07-06 NOTE — Progress Notes (Signed)
[place] for Lovenox --> Coumadin Indication: atrial fibrillation  No Known Allergies  Patient Measurements: Height: 5\' 7"  (170.2 cm) Weight: 247 lb 9.2 oz (112.3 kg) IBW/kg (Calculated) : 61.6  Vital Signs: Temp: 97.9 F (36.6 C) (07/08 0800) Temp src: Oral (07/08 0800) BP: 111/95 mmHg (07/08 0900) Pulse Rate: 89 (07/08 0900)  Labs:  Recent Labs  07/04/13 1120 07/05/13 0208 07/05/13 0743 07/05/13 1328 07/06/13 0421  HGB 16.6* 15.3*  --   --   --   HCT 47.6* 43.2  --   --   --   PLT 163 124*  --   --   --   LABPROT  --   --   --  15.9*  --   INR  --   --   --  1.30  --   CREATININE 3.38* 2.86*  --   --  1.34*  TROPONINI <0.30 <0.30 <0.30 <0.30  --    Estimated Creatinine Clearance: 63.5 ml/min (by C-G formula based on Cr of 1.34).  Medical History: Past Medical History  Diagnosis Date  . Hypertension   . Myocardial infarct, old   . Tobacco abuse   . Crack cocaine use   . Acute ischemic stroke 07/03/2012  . Hemiparesis, acute 07/04/2012  . Thrombocytopenia 07/03/2012  . LVH (left ventricular hypertrophy) 07/04/2012    Ejection fraction 60%.  . Diabetes   . Diabetes mellitus    Medications:  Prescriptions prior to admission  Medication Sig Dispense Refill  . amLODipine (NORVASC) 10 MG tablet Take 10 mg by mouth daily.      . [EXPIRED] aspirin 325 MG tablet Take 1 tablet (325 mg total) by mouth daily.      [name] atorvastatin (LIPITOR) 10 MG tablet Take 10 mg by mouth at bedtime.      . cloNIDine (CATAPRES) 0.2 MG tablet Take 0.2 mg by mouth 2 (two) times daily.      [name] lisinopril-hydrochlorothiazide (PRINZIDE,ZESTORETIC) 20-12.5 MG per tablet Take 2 tablets by mouth daily.      . metFORMIN (GLUCOPHAGE-XR) 500 MG 24 hr tablet Take 500 mg by mouth daily.      . metoprolol tartrate (LOPRESSOR) 25 MG tablet Take 12.5 mg by mouth 2 (two) times daily. For high blood pressure.      . traMADol (ULTRAM) 50 MG tablet Take 50 mg by mouth every  6 (six) hours as needed for pain.       Assessment: Okay for Protocol Estimated Creatinine Clearance: 63.5 ml/min (by C-G formula based on Cr of 1.34).  Pt is on full dose Lovenox for afib.  SCr has improved.  Starting Coumadin today per pharmacy protocol.  Goal of Therapy:  Anti-Xa level 0.6-1.2 units/ml 4hrs after LMWH dose given if checked. Monitor platelets by anticoagulation protocol: Yes   Plan:  Lovenox 1mg /kg SQ every 12hrs. Coumadin 7.5mg  today x 1. INR daily. CBC at least every 72 hours. Monitor renal function and H/H  Hart Robinsons A 07/06/2013,11:01 AM

## 2013-07-06 NOTE — Progress Notes (Signed)
Notified Dr. Roderic Palau that patient coughed a quarter-sized amount of bloody sputum and he ordered to discontinue anticoagulation until he re-evaluates situation.

## 2013-07-06 NOTE — Progress Notes (Signed)
ANTICOAGULATION CONSULT NOTE  Pharmacy Consult for Lovenox Indication: atrial fibrillation  No Known Allergies  Patient Measurements: Height: 5\' 7"  (170.2 cm) Weight: 247 lb 9.2 oz (112.3 kg) IBW/kg (Calculated) : 61.6  Vital Signs: Temp: 97.8 F (36.6 C) (07/08 0400) Temp src: Oral (07/08 0400) BP: 132/87 mmHg (07/08 0700) Pulse Rate: 78 (07/08 0700)  Labs:  Recent Labs  07/04/13 1120 07/05/13 0208 07/05/13 0743 07/05/13 1328 07/06/13 0421  HGB 16.6* 15.3*  --   --   --   HCT 47.6* 43.2  --   --   --   PLT 163 124*  --   --   --   LABPROT  --   --   --  15.9*  --   INR  --   --   --  1.30  --   CREATININE 3.38* 2.86*  --   --  1.34*  TROPONINI <0.30 <0.30 <0.30 <0.30  --    Estimated Creatinine Clearance: 63.5 ml/min (by C-G formula based on Cr of 1.34).  Medical History: Past Medical History  Diagnosis Date  . Hypertension   . Myocardial infarct, old   . Tobacco abuse   . Crack cocaine use   . Acute ischemic stroke 07/03/2012  . Hemiparesis, acute 07/04/2012  . Thrombocytopenia 07/03/2012  . LVH (left ventricular hypertrophy) 07/04/2012    Ejection fraction 60%.  . Diabetes   . Diabetes mellitus    Medications:  Prescriptions prior to admission  Medication Sig Dispense Refill  . amLODipine (NORVASC) 10 MG tablet Take 10 mg by mouth daily.      . [EXPIRED] aspirin 325 MG tablet Take 1 tablet (325 mg total) by mouth daily.      [name] atorvastatin (LIPITOR) 10 MG tablet Take 10 mg by mouth at bedtime.      . cloNIDine (CATAPRES) 0.2 MG tablet Take 0.2 mg by mouth 2 (two) times daily.      [name] lisinopril-hydrochlorothiazide (PRINZIDE,ZESTORETIC) 20-12.5 MG per tablet Take 2 tablets by mouth daily.      . metFORMIN (GLUCOPHAGE-XR) 500 MG 24 hr tablet Take 500 mg by mouth daily.      . metoprolol tartrate (LOPRESSOR) 25 MG tablet Take 12.5 mg by mouth 2 (two) times daily. For high blood pressure.      . traMADol (ULTRAM) 50 MG tablet Take 50 mg by mouth every 6 (six)  hours as needed for pain.       Assessment: Okay for Protocol Estimated Creatinine Clearance: 63.5 ml/min (by C-G formula based on Cr of 1.34).  Pt is on full dose Lovenox for afib.  SCr has improved.  Goal of Therapy:  Anti-Xa level 0.6-1.2 units/ml 4hrs after LMWH dose given if checked. Monitor platelets by anticoagulation protocol: Yes   Plan:  Lovenox 1mg /kg SQ every 12hrs. CBC at least every 72 hours. Monitor renal function and H/H F/U long term anti-coag plans.  Hart Robinsons A 07/06/2013,8:02 AM

## 2013-07-06 NOTE — Progress Notes (Addendum)
[facility] PROGRESS NOTE  Tracy [name] [id number] DOB: Mar 13, 1961 DOA: 07/04/2013 PCP: MUSE,ROCHELLE D., PA-C  Brief narrative:  This patient was admitted to the hospital with shortness of breath. She was found to have community-acquired pneumonia and associated sepsis. She also had acute renal failure due to volume depletion. She was started on empiric antibiotics with ceftriaxone and azithromycin. Unfortunately, her pneumonia has progressed on her chest x-ray. Vancomycin has been added to her antibiotic regimen. Her renal failure has gradually improved with IV fluids. She did have an episode of atrial fibrillation and was started on a Cardizem infusion. Within a few hours she converted to sinus rhythm. She was started on anticoagulation, especially with her prior history of stroke. Unfortunately, she has developed some hemoptysis and therefore anticoagulation was discontinued. This can be readdressed if patient has any recurrence of atrial fibrillation. Currently, her hypoxia is continuing to progress. We will continue on current antibiotics and try to wean oxygen as her clinical condition allows.  Assessment/Plan: sepsis due to community acquired pneumonia. Treating with ceftriaxone and azithromycin. Continue supportive oxygen and wean as tolerated. Cultures show no significant growth. Continue broad-spectrum antibiotics. She is still very tachypneic. We'll repeat chest x-ray today.   urinary tract infection. Rocephin should adequately cover the urine. She was also found to have Trichomonas in her urine. She was treated with a single dose of Flagyl 2grams. Urine culture shows nonspecific growth of 80,000 colonies.   Acute renal failure. Possibly due to volume depletion in the setting of ACE inhibitor use. We'll hold ACE inhibitors, continue with hydration, check renal ultrasound.  Renal function has significantly improved. Continue current treatments.   Hypertension. Restarted  Lopressor.  Diabetes. Sliding scale insulin    thyroid nodule. This can be further evaluated with thyroid ultrasound, this can be done electively as an outpatient.  Atrial fibrillation with RVR.  Patient had an episode of atrial fibrillation. She will start on a Cardizem infusion and spontaneously converted to sinus rhythm. This was over a few hours. She's not had any recurrence of atrial fibrillation since then. She's now on metoprolol for rate control.   Code Status: full code Family Communication: discussed with patient and family at bedside Disposition Plan: discharge home once improved   Consultants:  none  Procedures:  none  Antibiotics:  Rocephin 7/6  azithro 7/6  HPI/Subjective: Patient is still very short of breath. She is coughing. Denies any chest pain  Objective: Filed Vitals:   07/06/13 0600 07/06/13 0700 07/06/13 0800 07/06/13 0815  BP: 147/94 132/87  118/69  Pulse: 71 78  88  Temp:   97.9 F (36.6 C)   TempSrc:   Oral   Resp: 25 33  33  Height:      Weight:      SpO2: 97% 97%  97%    Intake/Output Summary (Last 24 hours) at 07/06/13 0953 Last data filed at 07/06/13 0600  Gross per 24 hour  Intake 2319.17 ml  Output   1050 ml  Net 1269.17 ml   Filed Weights   07/04/13 1545 07/05/13 0500 07/06/13 0500  Weight: 106.3 kg (234 lb 5.6 oz) 106.9 kg (235 lb 10.8 oz) 112.3 kg (247 lb 9.2 oz)    Exam:   General:  Increased resp effort  Cardiovascular: s1, s2, rrr  Respiratory: Diminished breath sounds with crackles at bases  Abdomen: soft, nt, nd, bs+  Musculoskeletal: no edema b/l   Data Reviewed: Basic Metabolic Panel:  Recent Labs Lab 07/04/13 1120 07/05/13  0208 07/05/13 1328 07/06/13 0421  NA 138 136  --  139  K 3.6 3.1*  --  3.8  CL 97 97  --  104  CO2 22 25  --  25  GLUCOSE 216* 149*  --  159*  BUN 34* 40*  --  24*  CREATININE 3.38* 2.86*  --  1.34*  CALCIUM 10.0 8.9  --  9.2  MG  --   --  1.7  --    Liver Function  Tests:  Recent Labs Lab 07/05/13 0208  AST 39*  ALT 33  ALKPHOS 142*  BILITOT 1.0  PROT 6.5  ALBUMIN 2.8*   No results found for this basename: LIPASE, AMYLASE,  in the last 168 hours No results found for this basename: AMMONIA,  in the last 168 hours CBC:  Recent Labs Lab 07/04/13 1120 07/05/13 0208  WBC 19.3* 14.7*  NEUTROABS 15.4*  --   HGB 16.6* 15.3*  HCT 47.6* 43.2  MCV 89.6 88.9  PLT 163 124*   Cardiac Enzymes:  Recent Labs Lab 07/04/13 1120 07/05/13 0208 07/05/13 0743 07/05/13 1328  TROPONINI <0.30 <0.30 <0.30 <0.30   BNP (last 3 results) No results found for this basename: PROBNP,  in the last 8760 hours CBG:  Recent Labs Lab 07/05/13 0721 07/05/13 1109 07/05/13 1613 07/05/13 2213 07/06/13 0732  GLUCAP 163* 130* 128* 128* 143*    Recent Results (from the past 240 hour(s))  CULTURE, BLOOD (ROUTINE X 2)     Status: None   Collection Time    07/04/13 12:10 PM      Result Value Range Status   Specimen Description BLOOD   Final   Special Requests     Final   Value: BOTTLES DRAWN AEROBIC AND ANAEROBIC BLOOD RIGHT ARM 8CC   Culture NO GROWTH 1 DAY   Final   Report Status PENDING   Incomplete  CULTURE, BLOOD (ROUTINE X 2)     Status: None   Collection Time    07/04/13 12:20 PM      Result Value Range Status   Specimen Description BLOOD   Final   Special Requests     Final   Value: BOTTLES DRAWN AEROBIC AND ANAEROBIC BLOOD RIGHT HAND [place]   Culture NO GROWTH 1 DAY   Final   Report Status PENDING   Incomplete  URINE CULTURE     Status: None   Collection Time    07/04/13 12:54 PM      Result Value Range Status   Specimen Description URINE, CLEAN CATCH   Final   Special Requests NONE   Final   Culture  Setup Time 07/04/2013 21:02   Final   Colony Count 80,000 COLONIES/ML   Final   Culture     Final   Value: Multiple bacterial morphotypes present, none predominant. Suggest appropriate recollection if clinically indicated.   Report Status  07/05/2013 FINAL   Final  MRSA PCR SCREENING     Status: None   Collection Time    07/04/13  3:46 PM      Result Value Range Status   MRSA by PCR NEGATIVE  NEGATIVE Final   Comment:            The GeneXpert MRSA Assay (FDA     approved for NASAL specimens     only), is one component of a     comprehensive MRSA colonization     surveillance program. It is not     intended  to diagnose MRSA     infection nor to guide or     monitor treatment for     MRSA infections.     Studies: Ct Chest Wo Contrast  07/04/2013   *RADIOLOGY REPORT*  Clinical Data: Evaluate lung densities.  CT CHEST WITHOUT CONTRAST  Technique:  Multidetector CT imaging of the chest was performed following the standard protocol without IV contrast.  Comparison: Chest radiograph 07/04/2013  Findings: Low density structure in the left thyroid lobe is suggestive for a nodule.  Right precarinal lymph node measures 1.6 cm in the short axis. Difficult to evaluate for hilar adenopathy due to respiratory artifact and lack of intravenous contrast. Heart size is enlarged.  No significant pericardial or pleural fluid.  Low attenuation of the liver is suggestive for steatosis.  Limited evaluation of the right adrenal gland.  Trachea and mainstem bronchi are patent.  Patchy airspace disease in the right lower lobe and right middle lobe.  A small amount of airspace disease in the posterior right upper lobe.  There is emphysema in the upper lobes.  No focal disease in the left lung. Multilevel degenerative changes in the thoracic spine.  IMPRESSION: Patchy airspace disease throughout the right lung.  Findings are most compatible with multifocal pneumonia.  Emphysema.  Mild mediastinal lymphadenopathy which is nonspecific and poorly characterized on this on noncontrast examination.  Consider follow- up of this finding.  Left thyroid nodule.  This could be further evaluated with a non emergent thyroid ultrasound.  Low density of the liver raises the  possibility of hepatic steatosis.   Original Report Authenticated By: Markus Daft, M.D.   US Renal  07/05/2013   *RADIOLOGY REPORT*  Clinical Data:  Acute renal failure.  RENAL/URINARY TRACT ULTRASOUND COMPLETE  Comparison:  None.  Findings:  Right Kidney:  Measures 11.8 cm in length. Normal in size and parenchymal echogenicity.  No evidence of mass or hydronephrosis.  Left Kidney:  Measures 11.9 cm in length. Normal in size and parenchymal echogenicity.  No evidence of mass or hydronephrosis.  Bladder:  Appears normal for degree of bladder distention.  IMPRESSION: Normal renal ultrasound.   Original Report Authenticated By: Marijo Conception.,  M.D.   Dg Chest Portable 1 View  07/04/2013   *RADIOLOGY REPORT*  Clinical Data: Short of breath, right upper quadrant pain  PORTABLE CHEST - 1 VIEW  Comparison: Prior chest x-ray 07/03/2012  Findings: Very low inspiratory volumes with bibasilar opacities more confluent on the right than the left.  Given the lower lung volumes, the degree of cardiomegaly is similar compared to prior. Pulmonary vascular crowding without overt edema.  Fullness in the right hilum.  IMPRESSION:  Very low inspiratory volumes with right greater than left bibasilar opacities.  The dense peripheral opacity in the right lower lobe may represent pneumonia.  However, given the appearance of fullness in the right hilum, an obstructing central mass and associated peripheral postobstructive changes is difficult to exclude on this single view. Consider further evaluation with CT scan of the chest.  Stable cardiomegaly.   Original Report Authenticated By: Jacqulynn Cadet, M.D.    Scheduled Meds: . aspirin  325 mg Oral Daily  . atorvastatin  10 mg Oral QHS  . azithromycin  500 mg Intravenous Q24H  . cefTRIAXone (ROCEPHIN)  IV  1 g Intravenous Q24H  . enoxaparin (LOVENOX) injection  120 mg Subcutaneous Q12H  . guaiFENesin  1,200 mg Oral BID  . insulin aspart  0-15 Units Subcutaneous TID WC  .  insulin aspart  0-5 Units Subcutaneous QHS  . metoprolol tartrate  25 mg Oral BID  . nicotine  21 mg Transdermal Daily   Continuous Infusions: . sodium chloride 75 mL/hr at 07/06/13 0409    Active Problems:   CAP (community acquired pneumonia)   Sepsis   Acute renal failure   Left thyroid nodule   Unspecified essential hypertension   Diabetes   Acute respiratory failure    Time spent: 20mins    Tracy [name]  [facility] Pager [phone]. If 7PM-7AM, please contact night-coverage at [email or web address], password [facility] 07/06/2013, 9:53 AM  LOS: 2 days

## 2013-07-06 NOTE — Progress Notes (Signed)
ANTIBIOTIC CONSULT NOTE - INITIAL  Pharmacy Consult for Vancomycin Indication: pneumonia  No Known Allergies  Patient Measurements: Height: 5\' 7"  (170.2 cm) Weight: 247 lb 9.2 oz (112.3 kg) IBW/kg (Calculated) : 61.6 Adjusted Body Weight: 81Kg  Vital Signs: Temp: 97.9 F (36.6 C) (07/08 0800) Temp src: Oral (07/08 0800) BP: 111/95 mmHg (07/08 0900) Pulse Rate: 89 (07/08 0900) Intake/Output from previous day: 07/07 0701 - 07/08 0700 In: 2564.2 [P.O.:720; I.V.:1844.2] Out: 1300 [Urine:1300] Intake/Output from this shift: Total I/O In: 225 [I.V.:225] Out: -   Labs:  Recent Labs  07/04/13 1120 07/05/13 0208 07/06/13 0421  WBC 19.3* 14.7*  --   HGB 16.6* 15.3*  --   PLT 163 124*  --   CREATININE 3.38* 2.86* 1.34*   Estimated Creatinine Clearance: 63.5 ml/min (by C-G formula based on Cr of 1.34). No results found for this basename: VANCOTROUGH, VANCOPEAK, VANCORANDOM, [place], GENTPEAK, GENTRANDOM, [place], TOBRAPEAK, TOBRARND, AMIKACINPEAK, AMIKACINTROU, AMIKACIN,  in the last 72 hours   Microbiology: Recent Results (from the past 720 hour(s))  CULTURE, BLOOD (ROUTINE X 2)     Status: None   Collection Time    07/04/13 12:10 PM      Result Value Range Status   Specimen Description BLOOD   Final   Special Requests     Final   Value: BOTTLES DRAWN AEROBIC AND ANAEROBIC BLOOD RIGHT ARM 8CC   Culture NO GROWTH 2 DAYS   Final   Report Status PENDING   Incomplete  CULTURE, BLOOD (ROUTINE X 2)     Status: None   Collection Time    07/04/13 12:20 PM      Result Value Range Status   Specimen Description BLOOD   Final   Special Requests     Final   Value: BOTTLES DRAWN AEROBIC AND ANAEROBIC BLOOD RIGHT HAND 8CC   Culture NO GROWTH 2 DAYS   Final   Report Status PENDING   Incomplete  URINE CULTURE     Status: None   Collection Time    07/04/13 12:54 PM      Result Value Range Status   Specimen Description URINE, CLEAN CATCH   Final   Special Requests NONE    Final   Culture  Setup Time 07/04/2013 21:02   Final   Colony Count 80,000 COLONIES/ML   Final   Culture     Final   Value: Multiple bacterial morphotypes present, none predominant. Suggest appropriate recollection if clinically indicated.   Report Status 07/05/2013 FINAL   Final  MRSA PCR SCREENING     Status: None   Collection Time    07/04/13  3:46 PM      Result Value Range Status   MRSA by PCR NEGATIVE  NEGATIVE Final   Comment:            The GeneXpert MRSA Assay (FDA     approved for NASAL specimens     only), is one component of a     comprehensive MRSA colonization     surveillance program. It is not     intended to diagnose MRSA     infection nor to guide or     monitor treatment for     MRSA infections.    Medical History: Past Medical History  Diagnosis Date  . Hypertension   . Myocardial infarct, old   . Tobacco abuse   . Crack cocaine use   . Acute ischemic stroke 07/03/2012  . Hemiparesis, acute 07/04/2012  .  Thrombocytopenia 07/03/2012  . LVH (left ventricular hypertrophy) 07/04/2012    Ejection fraction 60%.  . Diabetes   . Diabetes mellitus     Medications:  Scheduled:  . aspirin  325 mg Oral Daily  . atorvastatin  10 mg Oral QHS  . azithromycin  500 mg Intravenous Q24H  . cefTRIAXone (ROCEPHIN)  IV  1 g Intravenous Q24H  . enoxaparin (LOVENOX) injection  120 mg Subcutaneous Q12H  . guaiFENesin  1,200 mg Oral BID  . insulin aspart  0-15 Units Subcutaneous TID WC  . insulin aspart  0-5 Units Subcutaneous QHS  . metoprolol tartrate  25 mg Oral BID  . nicotine  21 mg Transdermal Daily  . vancomycin  1,500 mg Intravenous Q12H  . warfarin  7.5 mg Oral Once  . Warfarin - Pharmacist Dosing Inpatient   Does not apply Q24H   Assessment: 52yo obese female with suspected pna.  SCr has improved since admission.  Estimated Creatinine Clearance: 63.5 ml/min (by C-G formula based on Cr of 1.34).  Goal of Therapy:  Vancomycin trough level 15-20  mcg/ml  Plan: Vancomycin 1500mg  IV q12hrs (1st dose now) Check trough at steady state Monitor labs, renal fxn, and cultures  Hart Robinsons A 07/06/2013,1:09 PM

## 2013-07-07 DIAGNOSIS — I10 Essential (primary) hypertension: Secondary | ICD-10-CM

## 2013-07-07 LAB — GLUCOSE, CAPILLARY
Glucose-Capillary: 151 mg/dL — ABNORMAL HIGH (ref 70–99)
Glucose-Capillary: 149 mg/dL — ABNORMAL HIGH (ref 70–99)
Glucose-Capillary: 86 mg/dL (ref 70–99)
Glucose-Capillary: 168 mg/dL — ABNORMAL HIGH (ref 70–99)

## 2013-07-07 LAB — CBC
MCHC: 34.3 g/dL (ref 30.0–36.0)
Platelets: 168 10*3/uL (ref 150–400)
RDW: 13.8 % (ref 11.5–15.5)

## 2013-07-07 LAB — BASIC METABOLIC PANEL
GFR calc Af Amer: 82 mL/min — ABNORMAL LOW (ref >90)
GFR calc non Af Amer: 70 mL/min — ABNORMAL LOW (ref >90)
Potassium: 3.5 mEq/L (ref 3.5–5.1)
Sodium: 140 mEq/L (ref 135–145)

## 2013-07-07 LAB — PROTIME-INR: INR: 1.26 (ref 0.00–1.49)

## 2013-07-07 MED ORDER — CLONIDINE HCL 0.2 MG PO TABS
0.2000 mg | ORAL_TABLET | Freq: Two times a day (BID) | ORAL | Status: DC
  Administered 2013-07-07 – 2013-07-09 (×5): 0.2 mg via ORAL
  Filled 2013-07-07 (×5): qty 1

## 2013-07-07 MED ORDER — AZITHROMYCIN 250 MG PO TABS
500.0000 mg | ORAL_TABLET | Freq: Every day | ORAL | Status: DC
  Administered 2013-07-07 – 2013-07-08 (×2): 500 mg via ORAL
  Filled 2013-07-07 (×2): qty 2

## 2013-07-07 NOTE — Evaluation (Signed)
Physical Therapy Evaluation Patient Details Name: Larisa Garstka MRN: [id number] DOB: Aug 19, 1961 Today's Date: 07/07/2013 Time: [id number] PT Time Calculation (min): 25 min  PT Assessment / Plan / Recommendation History of Present Illness   Ms. Marbry has ben admitted to [facility] with respiratory failure.  The patient has a past hx of CVA last year.  She never received therapy after her hospitalization for her CVA due to lack of insurance.  Both daughter and pt are interested in OP therapy after discharged to maximize ms. Gilberg's functional status.  Clinical Impression  Pt has decreased balance and decreased strength although functional for short distances.    PT Assessment  Patient needs continued PT services    Follow Up Recommendations  Outpatient PT    Does the patient have the potential to tolerate intense rehabilitation    n/a  Barriers to Discharge  none      Equipment Recommendations    none   Recommendations for Other Services  none   Frequency Min 2X/week    Precautions / Restrictions Precautions Precautions: None Restrictions Weight Bearing Restrictions: No   Pertinent Vitals/Pain None stated     Mobility  Bed Mobility Bed Mobility: Supine to Sit Supine to Sit: 6: Modified independent (Device/Increase time) Transfers Transfers: Sit to Stand Sit to Stand: 6: Modified independent (Device/Increase time) Ambulation/Gait Ambulation/Gait Assistance: 5: Supervision Ambulation Distance (Feet): 20 Feet Assistive device: Rolling walker Gait Pattern: Within Functional Limits Gait velocity: normal    Exercises General Exercises - Lower Extremity Long Arc Quad: Both;10 reps Heel Raises: Both;10 reps Mini-Sqauts: Both;10 reps   PT Diagnosis: Generalized weakness  PT Problem List: Decreased strength;Decreased balance PT Treatment Interventions: Gait training;Balance training     PT Goals(Current goals can be found in the care plan section) Acute  Rehab PT Goals Patient Stated Goal: none stated PT Goal Formulation: With patient/family Time For Goal Achievement: 07/09/13 Potential to Achieve Goals: Good  Visit Information  Last PT Received On: 07/07/13       Prior Functioning  Home Living Family/patient expects to be discharged to:: Private residence Living Arrangements: Alone Available Help at Discharge: Family Type of Home: House Home Access: Stairs to enter Technical brewer of Steps: 2 Entrance Stairs-Rails: None Home Layout: One level Home Equipment: Environmental consultant - 2 wheels Prior Function Level of Independence: Independent with assistive device(s) Communication Communication: No difficulties Dominant Hand: Left    Cognition  Cognition Arousal/Alertness: Awake/alert    Extremity/Trunk Assessment Lower Extremity Assessment RLE Deficits / Details: decreased strength due to previous stroke but wfl LLE:  (wfl)   Balance    End of Session PT - End of Session Equipment Utilized During Treatment: Gait belt Activity Tolerance: Patient tolerated treatment well Patient left: in chair;with family/visitor present;with call bell/phone within reach  GP     Mart Colpitts,CINDY 07/07/2013, 2:52 PM

## 2013-07-07 NOTE — Progress Notes (Signed)
PHARMACIST - PHYSICIAN COMMUNICATION DR:   Roderic Palau CONCERNING: Antibiotic IV to Oral Route Change Policy  RECOMMENDATION: This patient is receiving Zithromax by the intravenous route.  Based on criteria approved by the Pharmacy and Therapeutics Committee, the antibiotic(s) is/are being converted to the equivalent oral dose form(s).  DESCRIPTION: These criteria include:  Patient being treated for a respiratory tract infection, urinary tract infection, cellulitis or clostridium difficile associated diarrhea if on metronidazole  The patient is not neutropenic and does not exhibit a GI malabsorption state  The patient is eating (either orally or via tube) and/or has been taking other orally administered medications for a least 24 hours  The patient is improving clinically and has a Tmax < 100.5  Pt is also on IV Rocephin and IV Vancomycin.  If you have questions about this conversion, please contact the Pharmacy Department  [x]   [phone] )  [facility] []   [phone] )  [facility]  []   [phone] )  [facility] []   [phone] )  [facility]   Thanks, [place]. [place], PharmD

## 2013-07-07 NOTE — Progress Notes (Signed)
[facility] PROGRESS NOTE  Tracy [name] [id number] DOB: 09/21/1961 DOA: 07/04/2013 PCP: Raiford Simmonds., PA-C [place] [facility] Neurologist: Dr. Merlene Laughter  Assessment/Plan: 1. Sepsis secondary to community-acquired pneumonia: With associated acute respiratory failure with hypoxia. Slow to improve but appears to be stabilizing. Continue empiric antibiotics. 2. Multifocal community acquired pneumonia with acute hypoxic respiratory failure: As above, continue empiric antibiotics. Respiratory status appears to be stabilizing. Oxygen requirement appears stable. Mediastinal lymphadenopathy seen on chest CT, significance unclear. Followup as an outpatient with repeat chest CT once acute issues resolved. 3. Acute renal failure: Resolved. Presumably secondary to acute illness, sepsis, likely complicated by ACE inhibitor. 4. Transient atrial fibrillation with rapid ventricular response: In setting of acute illness. One episode of hemoptysis and therefore anticoagulation discontinued. Unless recurs, would defer consideration of anticoagulation to the outpatient setting. 5. Left thyroid nodule: Followup as an outpatient.  6. Possible UTI: Continue empiric treatment. Trichomonas was treated with Flagyl. 7. Diabetes mellitus type 2: Appears stable. Continue sliding scale insulin. Resume metformin on discharge. 8. Hypertension: Continue Norvasc, metoprolol. Resume clonidine. 9. History of stroke: Continue aspirin, Lipitor.   Continue empiric antibiotics, nebulizer treatments, supplemental oxygen. Wean as tolerated.   resume clonidine  Transfer to medical floor  Code Status: Full code DVT prophylaxis: SCDs, consider Lovenox 7/10 if no recurrent hemoptysis  Family Communication: None present  Disposition Plan: : Improved  Murray Hodgkins, MD  [facility]  Pager [phone] If 7PM-7AM, please contact night-coverage at [email or web address], password [facility] 07/07/2013, 7:55 AM  LOS:  3 days   Clinical Summary: 52 year old woman presented to the emergency department with right-sided pleuritic chest pain, cough, fever. Initial evaluation revealed right-sided multifocal pneumonia, UTI, acute renal failure and patient was admitted for sepsis secondary to community corner pneumonia, UTI, acute renal failure.  Early in her hospitalization she developed atrial fibrillation with a rapid ventricular response. Treated with Cardizem infusion and started on anticoagulation. She had an episode of hemoptysis and therefore anticoagulation was discontinued.  Consultants:    Procedures:    Antibiotics:  Azithromycin 7/6 >>  Ceftriaxone 7/6 >>  IV vancomycin 7/8 >>   HPI/Subjective: Overall feels a little better today. Still coughing. Still short of breath. No nausea or vomiting. Eating okay.  Objective: Filed Vitals:   07/07/13 0400 07/07/13 0500 07/07/13 0727 07/07/13 0743  BP: 168/96 148/88    Pulse: 83 86    Temp: 98.4 F (36.9 C)   98.2 F (36.8 C)  TempSrc: Axillary   Oral  Resp: 26 30    Height:      Weight:  112.3 kg (247 lb 9.2 oz)    SpO2: 94% 92% 97%     Intake/Output Summary (Last 24 hours) at 07/07/13 0755 Last data filed at 07/07/13 0500  Gross per 24 hour  Intake   3350 ml  Output   1250 ml  Net   2100 ml     Filed Weights   07/05/13 0500 07/06/13 0500 07/07/13 0500  Weight: 106.9 kg (235 lb 10.8 oz) 112.3 kg (247 lb 9.2 oz) 112.3 kg (247 lb 9.2 oz)    Exam:   Afebrile. Tachypneic, respiratory rate 20s-30. Normotensive. Normal heart rate. Oxygenation high 80s to low 90s on 4 L per minute nasal cannula.  General: Appears calm, comfortable, sitting on the side of the bed eating breakfast.  Cardiovascular: Regular rate and rhythm. No murmur, rub, gallop. Trace bilateral lower extremity edema.  Telemetry: Sinus rhythm, no arrhythmias.  Respiratory: Clear to auscultation bilaterally. Posterior  respiratory crackles right lung base. No  frank rhonchi or rales. Mild increased respiratory effort and tachypnea. Fair air movement.  Psychiatric: grossly normal mood and affect. Speech fluent and appropriate.  Data Reviewed:  Adequate urine output 1250 mL.  Capillary blood sugars well controlled.  Acute renal failure recovers resolved with creatinine 3.38 on admission, now 0.92.  Cardiac enzymes negative.  CBC unremarkable today with normalization of white blood cell count 10.1. Improved since admission 19.3.  HIV nonreactive.strep pneumonia urinary antigen and Legionella antigen negative.   Urine culture with multiple morphotypes.  CT of the chest on admission revealed patchy airspace disease throughout the right lung compatible with multifocal pneumonia. The same is seen. Mild mediastinal lymphadenopathy, nonspecific. Left thyroid nodule. Possibility of hepatic steatosis.  Pending studies:   Blood cultures no growth thus far  Sputum culture pending.  Scheduled Meds: . albuterol  2.5 mg Nebulization Q4H WA  . aspirin  325 mg Oral Daily  . atorvastatin  10 mg Oral QHS  . azithromycin  500 mg Oral q1800  . cefTRIAXone (ROCEPHIN)  IV  1 g Intravenous Q24H  . guaiFENesin  1,200 mg Oral BID  . insulin aspart  0-15 Units Subcutaneous TID WC  . insulin aspart  0-5 Units Subcutaneous QHS  . ipratropium  0.5 mg Nebulization Q4H WA  . metoprolol tartrate  25 mg Oral BID  . nicotine  21 mg Transdermal Daily  . vancomycin  1,500 mg Intravenous Q12H   Continuous Infusions: . sodium chloride 75 mL/hr at 07/07/13 0500    Principal Problem:   CAP (community acquired pneumonia) Active Problems:   Sepsis   Acute renal failure   Left thyroid nodule   Unspecified essential hypertension   Diabetes   Acute respiratory failure   Time spent 20 minutes

## 2013-07-08 LAB — CULTURE, RESPIRATORY W GRAM STAIN: Culture: NORMAL

## 2013-07-08 LAB — GLUCOSE, CAPILLARY
Glucose-Capillary: 127 mg/dL — ABNORMAL HIGH (ref 70–99)
Glucose-Capillary: 120 mg/dL — ABNORMAL HIGH (ref 70–99)

## 2013-07-08 LAB — PROTIME-INR
INR: 1.25 (ref 0.00–1.49)
Prothrombin Time: 15.4 seconds — ABNORMAL HIGH (ref 11.6–15.2)

## 2013-07-08 NOTE — Progress Notes (Signed)
ANTIBIOTIC CONSULT NOTE   Pharmacy Consult for Vancomycin Indication: pneumonia  No Known Allergies  Patient Measurements: Height: 5\' 7"  (170.2 cm) Weight: 252 lb 3.3 oz (114.4 kg) IBW/kg (Calculated) : 61.6 Adjusted Body Weight: 81Kg  Vital Signs: Temp: 98.1 F (36.7 C) (07/10 0654) Temp src: Oral (07/10 0654) BP: 157/95 mmHg (07/10 0654) Pulse Rate: 73 (07/10 0654) Intake/Output from previous day: 07/09 0701 - 07/10 0700 In: 2640 [P.O.:1440; I.V.:150; IV Piggyback:1050] Out: 950 [Urine:950] Intake/Output from this shift:    Labs:  Recent Labs  07/06/13 0421 07/07/13 0442  WBC  --  10.1  HGB  --  13.7  PLT  --  168  CREATININE 1.34* 0.92   Estimated Creatinine Clearance: 93.4 ml/min (by C-G formula based on Cr of 0.92).  Recent Labs  07/08/13 0143  VANCORANDOM 16.0    Microbiology: Recent Results (from the past 720 hour(s))  CULTURE, BLOOD (ROUTINE X 2)     Status: None   Collection Time    07/04/13 12:10 PM      Result Value Range Status   Specimen Description BLOOD RIGHT ARM   Final   Special Requests BOTTLES DRAWN AEROBIC AND ANAEROBIC 8CC   Final   Culture NO GROWTH 3 DAYS   Final   Report Status PENDING   Incomplete  CULTURE, BLOOD (ROUTINE X 2)     Status: None   Collection Time    07/04/13 12:20 PM      Result Value Range Status   Specimen Description BLOOD RIGHT HAND   Final   Special Requests BOTTLES DRAWN AEROBIC AND ANAEROBIC 8CC   Final   Culture NO GROWTH 3 DAYS   Final   Report Status PENDING   Incomplete  URINE CULTURE     Status: None   Collection Time    07/04/13 12:54 PM      Result Value Range Status   Specimen Description URINE, CLEAN CATCH   Final   Special Requests NONE   Final   Culture  Setup Time 07/04/2013 21:02   Final   Colony Count 80,000 COLONIES/ML   Final   Culture     Final   Value: Multiple bacterial morphotypes present, none predominant. Suggest appropriate recollection if clinically indicated.   Report Status  07/05/2013 FINAL   Final  MRSA PCR SCREENING     Status: None   Collection Time    07/04/13  3:46 PM      Result Value Range Status   MRSA by PCR NEGATIVE  NEGATIVE Final   Comment:            The GeneXpert MRSA Assay (FDA     approved for NASAL specimens     only), is one component of a     comprehensive MRSA colonization     surveillance program. It is not     intended to diagnose MRSA     infection nor to guide or     monitor treatment for     MRSA infections.  CULTURE, RESPIRATORY (NON-EXPECTORATED)     Status: None   Collection Time    07/06/13  8:40 AM      Result Value Range Status   Specimen Description SPUTUM EXPECTORATED BLOODY   Final   Special Requests NONE   Final   Gram Stain     Final   Value: ABUNDANT WBC PRESENT,BOTH PMN AND MONONUCLEAR     ABUNDANT SQUAMOUS EPITHELIAL CELLS PRESENT     RARE Lonell Grandchild  POSITIVE COCCI     IN PAIRS RARE YEAST   Culture Culture reincubated for better growth   Final   Report Status PENDING   Incomplete   Medical History: Past Medical History  Diagnosis Date  . Hypertension   . Myocardial infarct, old   . Tobacco abuse   . Crack cocaine use   . Acute ischemic stroke 07/03/2012  . Hemiparesis, acute 07/04/2012  . Thrombocytopenia 07/03/2012  . LVH (left ventricular hypertrophy) 07/04/2012    Ejection fraction 60%.  . Diabetes   . Diabetes mellitus    Medications:  Scheduled:  . albuterol  2.5 mg Nebulization Q4H WA  . aspirin  325 mg Oral Daily  . atorvastatin  10 mg Oral QHS  . azithromycin  500 mg Oral q1800  . cefTRIAXone (ROCEPHIN)  IV  1 g Intravenous Q24H  . cloNIDine  0.2 mg Oral BID  . guaiFENesin  1,200 mg Oral BID  . insulin aspart  0-15 Units Subcutaneous TID WC  . insulin aspart  0-5 Units Subcutaneous QHS  . ipratropium  0.5 mg Nebulization Q4H WA  . metoprolol tartrate  25 mg Oral BID  . nicotine  21 mg Transdermal Daily  . vancomycin  1,500 mg Intravenous Q12H   Assessment: 52yo obese female with suspected  pna.  SCr has improved since admission.  Estimated Creatinine Clearance: 93.4 ml/min (by C-G formula based on Cr of 0.92).   Trough level is on target.  Goal of Therapy:  Vancomycin trough level 15-20 mcg/ml  Plan: Continue Vancomycin 1500mg  IV q12hrs  Check trough at least weekly while on Vancomycin Check SCr at least twice weekly while on Vancomycin Monitor labs, renal fxn, and cultures  Hart Robinsons A 07/08/2013,7:39 AM

## 2013-07-08 NOTE — Progress Notes (Signed)
[facility] PROGRESS NOTE  Tracy [name] [id number] DOB: [birth year] DOA: 07/04/2013 PCP: Raiford Simmonds., PA-C [place] [facility] Neurologist: Dr. Merlene Laughter  Assessment/Plan: 1. Sepsis secondary to community-acquired pneumonia: With associated acute respiratory failure with hypoxia. Slowly improving. Continue empiric antibiotic therapy but will discontinue vancomycin. MRSA screen was negative. 2. Multifocal community acquired pneumonia with acute hypoxic respiratory failure: Overall improving. Oxygen requirement appears stable. Mediastinal lymphadenopathy seen on chest CT, significance unclear. Followup as an outpatient with repeat chest CT once acute issues resolved. 3. Acute renal failure: Resolved. Presumably secondary to acute illness, sepsis, likely complicated by ACE inhibitor. 4. Transient atrial fibrillation with rapid ventricular response: In setting of acute illness. One episode of hemoptysis and therefore anticoagulation discontinued. Unless recurs, would defer consideration of anticoagulation to the outpatient setting. 5. Left thyroid nodule: Followup as an outpatient.  6. Possible UTI: Continue empiric treatment. Trichomonas was treated with Flagyl. 7. Diabetes mellitus type 2: Appears stable. Continue sliding scale insulin. Resume metformin on discharge. 8. Hypertension: Continue Norvasc, metoprolol, clonidine. 9. History of stroke: Continue aspirin, Lipitor.   Continue empiric antibiotics, except discontinue vancomycin. Continue nebulizer treatments, supplemental oxygen. Wean as tolerated.  Transfer to medical floor  Code Status: Full code DVT prophylaxis: SCDs, consider Lovenox 7/11 if no recurrent hemoptysis  Family Communication: None present  Disposition Plan: Home likely less than 72 hours  Murray Hodgkins, MD  [facility]  Pager [phone] If 7PM-7AM, please contact night-coverage at [email or web address], password [facility] 07/08/2013, 12:31 PM   LOS: 4 days   Clinical Summary: 52 year old woman presented to the emergency department with right-sided pleuritic chest pain, cough, fever. Initial evaluation revealed right-sided multifocal pneumonia, UTI, acute renal failure and patient was admitted for sepsis secondary to community corner pneumonia, UTI, acute renal failure.  Early in her hospitalization she developed atrial fibrillation with a rapid ventricular response. Treated with Cardizem infusion and started on anticoagulation. She had an episode of hemoptysis and therefore anticoagulation was discontinued.  Consultants:  Physical therapy: outpatient PT  Procedures:    Antibiotics:  Azithromycin 7/6 >>  Ceftriaxone 7/6 >>  IV vancomycin 7/8 >>   HPI/Subjective: Feels better. Less coughing. Breathing better.  Objective: Filed Vitals:   07/08/13 0654 07/08/13 0707 07/08/13 1109 07/08/13 1115  BP: 157/95   125/80  Pulse: 73   63  Temp: 98.1 F (36.7 C)   99.4 F (37.4 C)  TempSrc: Oral   Oral  Resp: 20   20  Height:      Weight:      SpO2: 91% 88% 94% 100%    Intake/Output Summary (Last 24 hours) at 07/08/13 1231 Last data filed at 07/08/13 1058  Gross per 24 hour  Intake   2010 ml  Output    950 ml  Net   1060 ml     Filed Weights   07/06/13 0500 07/07/13 0500 07/08/13 0500  Weight: 112.3 kg (247 lb 9.2 oz) 112.3 kg (247 lb 9.2 oz) 114.4 kg (252 lb 3.3 oz)    Exam:   Afebrile. Vital stable. Tachypnea resolved. Remains hypoxic.  Appears calm and comfortable. Speech fluent and clear.  Cardiovascular regular rate and rhythm. No murmur, rub, gallop. No significant lower extremity edema.  Respiratory generally clear to auscultation bilaterally. No wheezes, rales, rhonchi. Mild to moderate increased respiratory effort.  Data Reviewed:  Adequate urine output 1250 mL.  Capillary blood sugars stable.  Sputum culture normal oral pharyngeal flora  Pending studies:   Blood cultures no growth  thus  far  Scheduled Meds: . albuterol  2.5 mg Nebulization Q4H WA  . aspirin  325 mg Oral Daily  . atorvastatin  10 mg Oral QHS  . azithromycin  500 mg Oral q1800  . cefTRIAXone (ROCEPHIN)  IV  1 g Intravenous Q24H  . cloNIDine  0.2 mg Oral BID  . guaiFENesin  1,200 mg Oral BID  . insulin aspart  0-15 Units Subcutaneous TID WC  . insulin aspart  0-5 Units Subcutaneous QHS  . ipratropium  0.5 mg Nebulization Q4H WA  . metoprolol tartrate  25 mg Oral BID  . nicotine  21 mg Transdermal Daily  . vancomycin  1,500 mg Intravenous Q12H   Continuous Infusions:    Principal Problem:   CAP (community acquired pneumonia) Active Problems:   Sepsis   Acute renal failure   Left thyroid nodule   Unspecified essential hypertension   Diabetes   Acute respiratory failure   Time spent 15 minutes

## 2013-07-08 NOTE — Progress Notes (Signed)
Report called to Elliot Dally, RN.  Patient ready for transfer to room 318.  No acute distress noted.

## 2013-07-09 LAB — BASIC METABOLIC PANEL
CO2: 26 mEq/L (ref 19–32)
Chloride: 105 mEq/L (ref 96–112)
Glucose, Bld: 132 mg/dL — ABNORMAL HIGH (ref 70–99)
Sodium: 140 mEq/L (ref 135–145)

## 2013-07-09 LAB — CULTURE, BLOOD (ROUTINE X 2)

## 2013-07-09 LAB — CBC
Hemoglobin: 12.6 g/dL (ref 12.0–15.0)
Platelets: 180 10*3/uL (ref 150–400)
RBC: 4.1 MIL/uL (ref 3.87–5.11)
WBC: 9.1 10*3/uL (ref 4.0–10.5)

## 2013-07-09 LAB — GLUCOSE, CAPILLARY
Glucose-Capillary: 114 mg/dL — ABNORMAL HIGH (ref 70–99)
Glucose-Capillary: 140 mg/dL — ABNORMAL HIGH (ref 70–99)

## 2013-07-09 LAB — PROTIME-INR
INR: 1.22 (ref 0.00–1.49)
Prothrombin Time: 15.1 seconds (ref 11.6–15.2)

## 2013-07-09 MED ORDER — ASPIRIN 325 MG PO TABS: 325.0000 mg | ORAL_TABLET | Freq: Every day | ORAL | Status: AC

## 2013-07-09 MED ORDER — CEFUROXIME AXETIL 250 MG PO TABS: 500.0000 mg | ORAL_TABLET | Freq: Two times a day (BID) | ORAL | Status: DC

## 2013-07-09 MED ORDER — ALBUTEROL SULFATE HFA 108 (90 BASE) MCG/ACT IN AERS: 2.0000 | INHALATION_SPRAY | RESPIRATORY_TRACT | Status: DC | PRN

## 2013-07-09 MED ORDER — CEFUROXIME AXETIL 500 MG PO TABS: 500.0000 mg | ORAL_TABLET | Freq: Two times a day (BID) | ORAL | Status: DC

## 2013-07-09 MED ORDER — HYDROCODONE-ACETAMINOPHEN 5-325 MG PO TABS: 1.0000 | ORAL_TABLET | ORAL | Status: DC | PRN

## 2013-07-09 NOTE — Progress Notes (Signed)
Pt O2 sat 98% at rest on room air, O2 sat 72% on room air while ambulating, O2 sat rebounded to  95% while ambulating with O2 @ 4l

## 2013-07-09 NOTE — Progress Notes (Signed)
[facility] PROGRESS NOTE  Tracy [name] [id number] DOB: June 02, 1961 DOA: 07/04/2013 PCP: Raiford Simmonds., PA-C [place] [facility] Neurologist: Dr. Merlene Laughter  Assessment/Plan: 1. Sepsis secondary to community-acquired pneumonia: Sepsis resolved. Acute respiratory failure with hypoxia remained stable. 2. Multifocal community acquired pneumonia with acute hypoxic respiratory failure: Acute component stable. Discharge home on oxygen. Change to oral antibiotics. 3. Nonspecific mediastinal lymphadenopathy on chest CT: Followup as an outpatient once acute issues resolved. 4. Acute renal failure: Resolved. Presumably secondary to acute illness, sepsis, likely complicated by ACE inhibitor. 5. Transient atrial fibrillation with rapid ventricular response: Occurred in the setting of acute illness. No recurrence. Defer consideration of anticoagulation to the outpatient setting, however it do not feel indicated at this time given lack or recurrence. 6. Left thyroid nodule: Followup as an outpatient. 7. Possible UTI: Treated empirically. Trichomonas treated with Flagyl. 8. Diabetes mellitus type II: stable. 9. HTN: Continue Norvasc, metoprolol, clonidine. 10. History of stroke: Continue aspirin, Lipitor. 11. Cigarette smoker: Recommend cessation.   Change to oral antibiotics. Continue supplemental oxygen on discharge her hypoxia. Albuterol MDI on discharge with teaching.  Discharge home  Code Status: Full code DVT prophylaxis: SCDs Family Communication: None present  Disposition Plan: as above.   Murray Hodgkins, MD  [facility]  Pager [phone] If 7PM-7AM, please contact night-coverage at [email or web address], password [facility] 07/09/2013, 10:04 AM  LOS: 5 days   Clinical Summary: 52 year old woman presented to the emergency department with right-sided pleuritic chest pain, cough, fever. Initial evaluation revealed right-sided multifocal pneumonia, UTI, acute renal failure  and patient was admitted for sepsis secondary to community corner pneumonia, UTI, acute renal failure.  Early in her hospitalization she developed atrial fibrillation with a rapid ventricular response. Treated with Cardizem infusion and started on anticoagulation. She had an episode of hemoptysis and therefore anticoagulation was discontinued.  Consultants:  Physical therapy: outpatient PT  Procedures:  None   Antibiotics:  Azithromycin 7/6 >> 7/10   Ceftriaxone 7/6 >> 7/10  IV vancomycin 7/8 >> 7/10  Cefuroxime 7/11 >> 7/15  HPI/Subjective: Feels better, breathing better. Some cough. Eating ok. Ambulating ok. Wants to go home.  Objective: Filed Vitals:   07/08/13 2325 07/09/13 0410 07/09/13 0604 07/09/13 0742  BP:   127/81   Pulse:   67   Temp:   98.2 F (36.8 C)   TempSrc:   Oral   Resp:   20   Height:      Weight:      SpO2: 95% 92% 97% 90%    Intake/Output Summary (Last 24 hours) at 07/09/13 1004 Last data filed at 07/09/13 0900  Gross per 24 hour  Intake    480 ml  Output    250 ml  Net    230 ml     Filed Weights   07/06/13 0500 07/07/13 0500 07/08/13 0500  Weight: 112.3 kg (247 lb 9.2 oz) 112.3 kg (247 lb 9.2 oz) 114.4 kg (252 lb 3.3 oz)    Exam:   Afebrile. Vital stable. No tachypnea. Stable hypoxia 3.5 L.  General: Appears calm and comfortable. Nontoxic.  Cardiovascular: Regular rate and rhythm. No murmur, rub, gallop. No significant lower extremity edema.  Respiratory: Clear to auscultation anteriorly. Right-sided posterior coarse breath sounds and crackles. Mild increased respiratory effort. No rhonchi or wheezes.  Psychiatric: Grossly normal mood and affect. Speech fluent and appropriate.  Data Reviewed:  Capillary blood sugars stable.  Basic metabolic panel and CBC unremarkable.  Sputum culture: Normal oral pharyngeal flora  Pending  studies:   Blood cultures no growth thus far  Scheduled Meds: . albuterol  2.5 mg Nebulization  Q4H WA  . aspirin  325 mg Oral Daily  . atorvastatin  10 mg Oral QHS  . azithromycin  500 mg Oral q1800  . cefTRIAXone (ROCEPHIN)  IV  1 g Intravenous Q24H  . cloNIDine  0.2 mg Oral BID  . guaiFENesin  1,200 mg Oral BID  . insulin aspart  0-15 Units Subcutaneous TID WC  . insulin aspart  0-5 Units Subcutaneous QHS  . ipratropium  0.5 mg Nebulization Q4H WA  . metoprolol tartrate  25 mg Oral BID  . nicotine  21 mg Transdermal Daily   Continuous Infusions:    Principal Problem:   CAP (community acquired pneumonia) Active Problems:   Sepsis   Acute renal failure   Left thyroid nodule   Unspecified essential hypertension   Diabetes   Acute respiratory failure

## 2013-07-09 NOTE — Plan of Care (Signed)
Problem: Phase II Progression Outcomes Goal: Wean O2 if indicated Outcome: Not Met (add Reason) Pt going home on O2 Sats too low Goal: Pain controlled Outcome: Completed/Met Date Met:  07/09/13 denies  Problem: Phase III Progression Outcomes Goal: O2 sats > or equal to 93% on room air Outcome: Not Met (add Reason) Home O2 provided Goal: Pain controlled on oral analgesia Outcome: Not Applicable Date Met:  [id number] denies  Problem: Discharge Progression Outcomes Goal: Barriers To Progression Addressed/Resolved Outcome: Completed/Met Date Met:  07/09/13 Home O2 Goal: O2 sats at patient's baseline Outcome: Not Met (add Reason) Home O2 Goal: Complications resolved/controlled Outcome: Completed/Met Date Met:  07/09/13 Controlled O2 at home Goal: Vaccine documented on D/C instructions Outcome: Completed/Met Date Met:  07/09/13 Has been imunized Goal: Other Discharge Outcomes/Goals Outcome: Completed/Met Date Met:  07/09/13 Daughter agrees to call for appt next week are records have the wrong number for physician

## 2013-07-09 NOTE — Discharge Summary (Signed)
Physician Discharge Summary  Tracy [name] [id number] DOB: 1961/10/16 DOA: 07/04/2013  PCP: Royce Macadamia D., PA-C  Admit date: 07/04/2013 Discharge date: 07/09/2013  Recommendations for Outpatient Follow-up:  1. Followup resolution of pneumonia and hypoxic respiratory failure. Wean off oxygen as tolerated. 2. Nonspecific mediastinal lymphadenopathy and chest CT. Consider repeat study once acute illness completely resolved. 3. Left thyroid nodule, consider further evaluation as an outpatient   Follow-up Information   Follow up with MUSE,ROCHELLE D., PA-C In 1 week.   Contact information:   [address] [place] [address] [phone]      Discharge Diagnoses:  1. Sepsis secondary to community acquired pneumonia 2. Multifocal community acquired pneumonia with acute hypoxic respiratory failure 3. Nonspecific mediastinal lymphadenopathy and chest CT 4. Acute renal failure 5. Transient atrial fibrillation with rapid ventricular response 6. Incidental finding of left thyroid nodule 7. Cigarette smoker  Discharge Condition: Improved Disposition: Home with home oxygen 3.5 L per minute nasal cannula  Diet recommendation: Diabetic diet  Filed Weights   07/06/13 0500 07/07/13 0500 07/08/13 0500  Weight: 112.3 kg (247 lb 9.2 oz) 112.3 kg (247 lb 9.2 oz) 114.4 kg (252 lb 3.3 oz)    History of present illness:  52 year old woman presented to the emergency department with right-sided pleuritic chest pain, cough, fever. Initial evaluation revealed right-sided multifocal pneumonia, UTI, acute renal failure and patient was admitted for sepsis secondary to community corner pneumonia, UTI, acute renal failure.  Hospital Course:  [name] gradually improved with empiric treatment for community-acquired pneumonia. Hypoxic respiratory failure stabilized but at time of discharge patient continues to require oxygen. She go home with oxygen therapy and followup as an outpatient. Sepsis  resolved with empiric treatment. Acute renal failure resolved with fluids and withholding of antihypertensives. Complete antibiotics as an outpatient. Smoking cessation was recommended. Early in her hospitalization she developed atrial fibrillation with a rapid ventricular response. Treated with Cardizem infusion and started on anticoagulation. She had an episode of hemoptysis and therefore anticoagulation was discontinued. Individual issues as below.  1. Sepsis secondary to community-acquired pneumonia: Sepsis resolved. Acute respiratory failure with hypoxia remained stable. 2. Multifocal community acquired pneumonia with acute hypoxic respiratory failure: Acute component stable. Discharge home on oxygen. Change to oral antibiotics. 3. Nonspecific mediastinal lymphadenopathy on chest CT: Followup as an outpatient once acute issues resolved. 4. Acute renal failure: Resolved. Presumably secondary to acute illness, sepsis, likely complicated by ACE inhibitor. 5. Transient atrial fibrillation with rapid ventricular response: Occurred in the setting of acute illness. No recurrence. Defer consideration of anticoagulation to the outpatient setting, however it do not feel indicated at this time given lack or recurrence. 6. Left thyroid nodule: Followup as an outpatient. 7. Possible UTI: Treated empirically. Trichomonas treated with Flagyl. 8. Diabetes mellitus type II: stable. 9. HTN: Continue Norvasc, metoprolol, clonidine. 10. History of stroke: Continue aspirin, Lipitor. 11. Cigarette smoker: Recommend cessation.  Consultants:  Physical therapy: outpatient PT Procedures:  None  Antibiotics:  Azithromycin 7/6 >> 7/10  Ceftriaxone 7/6 >> 7/10  IV vancomycin 7/8 >> 7/10  Cefuroxime 7/11 >> 7/15  Discharge Instructions  Discharge Orders   Future Orders Complete By Expires     Diet Carb Modified  As directed     Discharge instructions  As directed     Comments:      Be sure to complete  antibiotic. Continue oxygen until your primary care physician instructs you to discontinue. Call your physician or seek immediate medical attention for increasing shortness of breath,  pain or worsening of condition.    For home use only DME oxygen  As directed     Questions:      Mode or (Route):      Liters per Minute:  3.5    Frequency:  Continuous    Oxygen conserving device:      Increase activity slowly  As directed         Medication List         albuterol 108 (90 BASE) MCG/ACT inhaler  Commonly known as:  PROVENTIL HFA;VENTOLIN HFA  Inhale 2 puffs into the lungs every 4 (four) hours as needed for wheezing or shortness of breath.     amLODipine 10 MG tablet  Commonly known as:  NORVASC  Take 10 mg by mouth daily.     aspirin 325 MG tablet  Take 1 tablet (325 mg total) by mouth daily.     atorvastatin 10 MG tablet  Commonly known as:  LIPITOR  Take 10 mg by mouth at bedtime.     cefUROXime 500 MG tablet  Commonly known as:  CEFTIN  Take 1 tablet (500 mg total) by mouth 2 (two) times daily with a meal.     cloNIDine 0.2 MG tablet  Commonly known as:  CATAPRES  Take 0.2 mg by mouth 2 (two) times daily.     HYDROcodone-acetaminophen 5-325 MG per tablet  Commonly known as:  NORCO/VICODIN  Take 1 tablet by mouth every 4 (four) hours as needed for pain.     lisinopril-hydrochlorothiazide 20-12.5 MG per tablet  Commonly known as:  PRINZIDE,ZESTORETIC  Take 2 tablets by mouth daily.     metFORMIN 500 MG 24 hr tablet  Commonly known as:  GLUCOPHAGE-XR  Take 500 mg by mouth daily.     metoprolol tartrate 25 MG tablet  Commonly known as:  LOPRESSOR  Take 12.5 mg by mouth 2 (two) times daily. For high blood pressure.     traMADol 50 MG tablet  Commonly known as:  ULTRAM  Take 50 mg by mouth every 6 (six) hours as needed for pain.       No Known Allergies  The results of significant diagnostics from this hospitalization (including imaging, microbiology, ancillary  and laboratory) are listed below for reference.    Significant Diagnostic Studies: Ct Chest Wo Contrast  07/04/2013   *RADIOLOGY REPORT*  Clinical Data: Evaluate lung densities.  CT CHEST WITHOUT CONTRAST  Technique:  Multidetector CT imaging of the chest was performed following the standard protocol without IV contrast.  Comparison: Chest radiograph 07/04/2013  Findings: Low density structure in the left thyroid lobe is suggestive for a nodule.  Right precarinal lymph node measures 1.6 cm in the short axis. Difficult to evaluate for hilar adenopathy due to respiratory artifact and lack of intravenous contrast. Heart size is enlarged.  No significant pericardial or pleural fluid.  Low attenuation of the liver is suggestive for steatosis.  Limited evaluation of the right adrenal gland.  Trachea and mainstem bronchi are patent.  Patchy airspace disease in the right lower lobe and right middle lobe.  A small amount of airspace disease in the posterior right upper lobe.  There is emphysema in the upper lobes.  No focal disease in the left lung. Multilevel degenerative changes in the thoracic spine.  IMPRESSION: Patchy airspace disease throughout the right lung.  Findings are most compatible with multifocal pneumonia.  Emphysema.  Mild mediastinal lymphadenopathy which is nonspecific and poorly characterized on this on noncontrast  examination.  Consider follow- up of this finding.  Left thyroid nodule.  This could be further evaluated with a non emergent thyroid ultrasound.  Low density of the liver raises the possibility of hepatic steatosis.   Original Report Authenticated By: Markus Daft, M.D.   US Renal  07/05/2013   *RADIOLOGY REPORT*  Clinical Data:  Acute renal failure.  RENAL/URINARY TRACT ULTRASOUND COMPLETE  Comparison:  None.  Findings:  Right Kidney:  Measures 11.8 cm in length. Normal in size and parenchymal echogenicity.  No evidence of mass or hydronephrosis.  Left Kidney:  Measures 11.9 cm in length.  Normal in size and parenchymal echogenicity.  No evidence of mass or hydronephrosis.  Bladder:  Appears normal for degree of bladder distention.  IMPRESSION: Normal renal ultrasound.   Original Report Authenticated By: Marijo Conception.,  M.D.   Dg Chest Port 1 View  07/06/2013   *RADIOLOGY REPORT*  Clinical Data: Dyspnea, pneumonia  PORTABLE CHEST - 1 VIEW  Comparison: Portable exam 1101 hours compared to 07/04/2013  Findings: Enlargement of cardiac silhouette with pulmonary vascular congestion. Bibasilar infiltrates greater on right increased since previous exam. Upper lungs clear. Mediastinal contours stable. No gross pleural effusion or pneumothorax.  IMPRESSION: Bibasilar infiltrates consistent with pneumonia, increased on right since previous exam.   Original Report Authenticated By: Lavonia Dana, M.D.   Dg Chest Portable 1 View  07/04/2013   *RADIOLOGY REPORT*  Clinical Data: Short of breath, right upper quadrant pain  PORTABLE CHEST - 1 VIEW  Comparison: Prior chest x-ray 07/03/2012  Findings: Very low inspiratory volumes with bibasilar opacities more confluent on the right than the left.  Given the lower lung volumes, the degree of cardiomegaly is similar compared to prior. Pulmonary vascular crowding without overt edema.  Fullness in the right hilum.  IMPRESSION:  Very low inspiratory volumes with right greater than left bibasilar opacities.  The dense peripheral opacity in the right lower lobe may represent pneumonia.  However, given the appearance of fullness in the right hilum, an obstructing central mass and associated peripheral postobstructive changes is difficult to exclude on this single view. Consider further evaluation with CT scan of the chest.  Stable cardiomegaly.   Original Report Authenticated By: Jacqulynn Cadet, M.D.    Microbiology: Recent Results (from the past 240 hour(s))  CULTURE, BLOOD (ROUTINE X 2)     Status: None   Collection Time    07/04/13 12:10 PM      Result Value  Range Status   Specimen Description BLOOD RIGHT ARM   Final   Special Requests BOTTLES DRAWN AEROBIC AND ANAEROBIC 8CC   Final   Culture NO GROWTH 4 DAYS   Final   Report Status PENDING   Incomplete  CULTURE, BLOOD (ROUTINE X 2)     Status: None   Collection Time    07/04/13 12:20 PM      Result Value Range Status   Specimen Description BLOOD RIGHT HAND   Final   Special Requests BOTTLES DRAWN AEROBIC AND ANAEROBIC 8CC   Final   Culture NO GROWTH 4 DAYS   Final   Report Status PENDING   Incomplete  URINE CULTURE     Status: None   Collection Time    07/04/13 12:54 PM      Result Value Range Status   Specimen Description URINE, CLEAN CATCH   Final   Special Requests NONE   Final   Culture  Setup Time 07/04/2013 21:02   Final  Colony Count 80,000 COLONIES/ML   Final   Culture     Final   Value: Multiple bacterial morphotypes present, none predominant. Suggest appropriate recollection if clinically indicated.   Report Status 07/05/2013 FINAL   Final  MRSA PCR SCREENING     Status: None   Collection Time    07/04/13  3:46 PM      Result Value Range Status   MRSA by PCR NEGATIVE  NEGATIVE Final   Comment:            The GeneXpert MRSA Assay (FDA     approved for NASAL specimens     only), is one component of a     comprehensive MRSA colonization     surveillance program. It is not     intended to diagnose MRSA     infection nor to guide or     monitor treatment for     MRSA infections.  CULTURE, RESPIRATORY (NON-EXPECTORATED)     Status: None   Collection Time    07/06/13  8:40 AM      Result Value Range Status   Specimen Description SPUTUM EXPECTORATED BLOODY   Final   Special Requests NONE   Final   Gram Stain     Final   Value: ABUNDANT WBC PRESENT,BOTH PMN AND MONONUCLEAR     ABUNDANT SQUAMOUS EPITHELIAL CELLS PRESENT     RARE GRAM POSITIVE COCCI     IN PAIRS RARE YEAST   Culture NORMAL OROPHARYNGEAL FLORA   Final   Report Status 07/08/2013 FINAL   Final      Labs: Basic Metabolic Panel:  Recent Labs Lab 07/04/13 1120 07/05/13 0208 07/05/13 1328 07/06/13 0421 07/07/13 0442 07/09/13 0509  NA 138 136  --  139 140 140  K 3.6 3.1*  --  3.8 3.5 3.7  CL 97 97  --  104 105 105  CO2 22 25  --  25 26 26   GLUCOSE 216* 149*  --  159* 145* 132*  BUN 34* 40*  --  24* 13 11  CREATININE 3.38* 2.86*  --  1.34* 0.92 0.90  CALCIUM 10.0 8.9  --  9.2 8.9 8.7  MG  --   --  1.7  --   --   --    Liver Function Tests:  Recent Labs Lab 07/05/13 0208  AST 39*  ALT 33  ALKPHOS 142*  BILITOT 1.0  PROT 6.5  ALBUMIN 2.8*   CBC:  Recent Labs Lab 07/04/13 1120 07/05/13 0208 07/07/13 0442 07/09/13 0509  WBC 19.3* 14.7* 10.1 9.1  NEUTROABS 15.4*  --   --   --   HGB 16.6* 15.3* 13.7 12.6  HCT 47.6* 43.2 40.0 37.0  MCV 89.6 88.9 90.3 90.2  PLT 163 124* 168 180   Cardiac Enzymes:  Recent Labs Lab 07/04/13 1120 07/05/13 0208 07/05/13 0743 07/05/13 1328  TROPONINI <0.30 <0.30 <0.30 <0.30   CBG:  Recent Labs Lab 07/08/13 0735 07/08/13 1120 07/08/13 1625 07/08/13 2145 07/09/13 0713  GLUCAP 127* 146* 201* 120* 114*    Principal Problem:   CAP (community acquired pneumonia) Active Problems:   Sepsis   Acute renal failure   Left thyroid nodule   Unspecified essential hypertension   Diabetes   Acute respiratory failure   Time coordinating discharge: 25 minutes  Signed:  Murray Hodgkins, MD [facility] 07/09/2013, 10:21 AM

## 2013-07-16 ENCOUNTER — Other Ambulatory Visit (HOSPITAL_COMMUNITY): Payer: Self-pay | Admitting: *Deleted

## 2013-07-16 DIAGNOSIS — E041 Nontoxic single thyroid nodule: Secondary | ICD-10-CM

## 2013-07-20 ENCOUNTER — Other Ambulatory Visit (HOSPITAL_COMMUNITY): Payer: Self-pay | Admitting: *Deleted

## 2013-07-20 ENCOUNTER — Ambulatory Visit (HOSPITAL_COMMUNITY)
Admission: RE | Admit: 2013-07-20 | Discharge: 2013-07-20 | Disposition: A | Discharge status: Home / Self Care | Payer: Medicaid Other | Source: Ambulatory Visit | Attending: *Deleted | Admitting: *Deleted

## 2013-07-20 ENCOUNTER — Ambulatory Visit (HOSPITAL_COMMUNITY): Payer: Medicaid Other | Attending: *Deleted

## 2013-07-20 DIAGNOSIS — I10 Essential (primary) hypertension: Secondary | ICD-10-CM | POA: Insufficient documentation

## 2013-07-20 DIAGNOSIS — J189 Pneumonia, unspecified organism: Secondary | ICD-10-CM

## 2013-08-03 ENCOUNTER — Encounter (HOSPITAL_COMMUNITY): Payer: Self-pay | Admitting: *Deleted

## 2013-08-03 ENCOUNTER — Emergency Department (HOSPITAL_COMMUNITY)
Admission: EM | Admit: 2013-08-03 | Discharge: 2013-08-03 | Disposition: A | Discharge status: Home / Self Care | Payer: Medicaid Other | Attending: Emergency Medicine | Admitting: Emergency Medicine

## 2013-08-03 DIAGNOSIS — R1012 Left upper quadrant pain: Secondary | ICD-10-CM | POA: Insufficient documentation

## 2013-08-03 DIAGNOSIS — M7989 Other specified soft tissue disorders: Secondary | ICD-10-CM

## 2013-08-03 DIAGNOSIS — Z8673 Personal history of transient ischemic attack (TIA), and cerebral infarction without residual deficits: Secondary | ICD-10-CM | POA: Insufficient documentation

## 2013-08-03 DIAGNOSIS — F17200 Nicotine dependence, unspecified, uncomplicated: Secondary | ICD-10-CM | POA: Insufficient documentation

## 2013-08-03 DIAGNOSIS — I10 Essential (primary) hypertension: Secondary | ICD-10-CM | POA: Insufficient documentation

## 2013-08-03 DIAGNOSIS — Z7982 Long term (current) use of aspirin: Secondary | ICD-10-CM | POA: Insufficient documentation

## 2013-08-03 DIAGNOSIS — I252 Old myocardial infarction: Secondary | ICD-10-CM | POA: Insufficient documentation

## 2013-08-03 DIAGNOSIS — Z8679 Personal history of other diseases of the circulatory system: Secondary | ICD-10-CM | POA: Insufficient documentation

## 2013-08-03 DIAGNOSIS — E119 Type 2 diabetes mellitus without complications: Secondary | ICD-10-CM | POA: Insufficient documentation

## 2013-08-03 DIAGNOSIS — Z79899 Other long term (current) drug therapy: Secondary | ICD-10-CM | POA: Insufficient documentation

## 2013-08-03 DIAGNOSIS — Z8669 Personal history of other diseases of the nervous system and sense organs: Secondary | ICD-10-CM | POA: Insufficient documentation

## 2013-08-03 DIAGNOSIS — Z862 Personal history of diseases of the blood and blood-forming organs and certain disorders involving the immune mechanism: Secondary | ICD-10-CM | POA: Insufficient documentation

## 2013-08-03 LAB — URINALYSIS, ROUTINE W REFLEX MICROSCOPIC
Leukocytes, UA: NEGATIVE
Nitrite: NEGATIVE
Specific Gravity, Urine: >1.03 — ABNORMAL HIGH (ref 1.005–1.030)
pH: 5.5 (ref 5.0–8.0)

## 2013-08-03 LAB — BASIC METABOLIC PANEL
CO2: 27 mEq/L (ref 19–32)
Chloride: 98 mEq/L (ref 96–112)
Creatinine, Ser: 2.21 mg/dL — ABNORMAL HIGH (ref 0.50–1.10)
GFR calc Af Amer: 28 mL/min — ABNORMAL LOW (ref >90)
Potassium: 3.6 mEq/L (ref 3.5–5.1)
Sodium: 138 mEq/L (ref 135–145)

## 2013-08-03 LAB — CBC WITH DIFFERENTIAL/PLATELET
Basophils Absolute: 0 10*3/uL (ref 0.0–0.1)
Basophils Relative: 0 % (ref 0–1)
Lymphocytes Relative: 33 % (ref 12–46)
MCHC: 33.6 g/dL (ref 30.0–36.0)
Neutro Abs: 6 10*3/uL (ref 1.7–7.7)
Neutrophils Relative %: 59 % (ref 43–77)
Platelets: 149 10*3/uL — ABNORMAL LOW (ref 150–400)
RDW: 13.8 % (ref 11.5–15.5)
WBC: 10 10*3/uL (ref 4.0–10.5)

## 2013-08-03 LAB — PROTIME-INR: Prothrombin Time: 14.3 seconds (ref 11.6–15.2)

## 2013-08-03 MED ORDER — ENOXAPARIN SODIUM 100 MG/ML ~~LOC~~ SOLN
100.0000 mg | Freq: Once | SUBCUTANEOUS | Status: AC
  Administered 2013-08-03: 100 mg via SUBCUTANEOUS
  Filled 2013-08-03: qty 1

## 2013-08-03 MED ORDER — SODIUM CHLORIDE 0.9 % IV SOLN
INTRAVENOUS | Status: DC
  Administered 2013-08-03: 20:00:00 via INTRAVENOUS

## 2013-08-03 MED ORDER — SODIUM CHLORIDE 0.9 % IV BOLUS (SEPSIS)
1000.0000 mL | Freq: Once | INTRAVENOUS | Status: AC
  Administered 2013-08-03: 1000 mL via INTRAVENOUS

## 2013-08-03 NOTE — ED Notes (Signed)
Per Pharmacist Nicki Reaper, give pt Lovenox 100mg  and the pharmacist will follow up.

## 2013-08-03 NOTE — ED Provider Notes (Signed)
CSN: [id number]     Arrival date & time 08/03/13  1625 History     First MD Initiated Contact with Patient 08/03/13 2005     Chief Complaint  Patient presents with  . Extremity Pain   (Consider location/radiation/quality/duration/timing/severity/associated sxs/prior Treatment) HPI Comments: Tracy [name] is a 52 y.o. Female presents for evaluation of right leg pain and swelling for 3 days. She also complains of "inability to eat" which is associated with trying to eat solid foods and then feeling full. She denies nausea, vomiting, diarrhea. She has not had a fever or chills, cough, shortness of breath, or chest pain. She was recently admitted for pneumonia, but that feels better, now. The pain in the right leg is worse with ambulation. There no other known modifying factors.  Patient is a 52 y.o. female presenting with extremity pain. The history is provided by the patient.  Extremity Pain    Past Medical History  Diagnosis Date  . Hypertension   . Myocardial infarct, old   . Tobacco abuse   . Crack cocaine use   . Acute ischemic stroke 07/03/2012  . Hemiparesis, acute 07/04/2012  . Thrombocytopenia 07/03/2012  . LVH (left ventricular hypertrophy) 07/04/2012    Ejection fraction 60%.  . Diabetes   . Diabetes mellitus    Past Surgical History  Procedure Laterality Date  . Abdominal hysterectomy     No family history on file. History  Substance Use Topics  . Smoking status: Current Every Day Smoker    Types: Cigarettes  . Smokeless tobacco: Not on file  . Alcohol Use: No   OB History   Grav Para Term Preterm Abortions TAB SAB Ect Mult Living                 Review of Systems  All other systems reviewed and are negative.    Allergies  Review of patient's allergies indicates no known allergies.  Home Medications   Current Outpatient Rx  Name  Route  Sig  Dispense  Refill  . albuterol (PROVENTIL HFA;VENTOLIN HFA) 108 (90 BASE) MCG/ACT inhaler   Inhalation    Inhale 2 puffs into the lungs every 4 (four) hours as needed for wheezing or shortness of breath.   1 Inhaler   0   . amLODipine (NORVASC) 10 MG tablet   Oral   Take 10 mg by mouth daily.         [name] aspirin 325 MG tablet   Oral   Take 1 tablet (325 mg total) by mouth daily.         [name] atorvastatin (LIPITOR) 10 MG tablet   Oral   Take 10 mg by mouth at bedtime.         . cloNIDine (CATAPRES) 0.2 MG tablet   Oral   Take 0.2 mg by mouth 2 (two) times daily.         . DULoxetine (CYMBALTA) 30 MG capsule   Oral   Take 30-60 mg by mouth daily. To take one capsule daily for 7 days then increase to two capsule daily thereafter         . lisinopril-hydrochlorothiazide (PRINZIDE,ZESTORETIC) 20-12.5 MG per tablet   Oral   Take 2 tablets by mouth daily.         . metFORMIN (GLUCOPHAGE-XR) 500 MG 24 hr tablet   Oral   Take 500 mg by mouth daily.         . methocarbamol (ROBAXIN) 500 MG tablet   Oral  Take 500 mg by mouth 4 (four) times daily.         . traMADol (ULTRAM) 50 MG tablet   Oral   Take 50 mg by mouth every 6 (six) hours as needed for pain.          BP 89/54  Pulse 79  Temp(Src) 97.3 F (36.3 C) (Oral)  Resp 20  Ht 5\' 6"  (1.676 m)  Wt 234 lb (106.142 kg)  BMI 37.79 kg/m2  SpO2 93% Physical Exam  Nursing note and vitals reviewed. Constitutional: She is oriented to person, place, and time. She appears well-developed and well-nourished.  HENT:  Head: Normocephalic and atraumatic.  Eyes: Conjunctivae and EOM are normal. Pupils are equal, round, and reactive to light.  Neck: Normal range of motion and phonation normal. Neck supple.  Cardiovascular: Normal rate, regular rhythm and intact distal pulses.   Pulmonary/Chest: Effort normal and breath sounds normal. She exhibits no tenderness.  Abdominal: Soft. She exhibits no distension and no mass. There is tenderness (Left upper quadrant tenderness, mild). There is no rebound and no guarding.   Musculoskeletal: Normal range of motion.  Right leg, swollen as compared to the left. There is tenderness to palpation and the right popliteal space in the calf is tender to palpation. Normal pulse, right foot.  Neurological: She is alert and oriented to person, place, and time. She has normal strength. She exhibits normal muscle tone.  Skin: Skin is warm and dry.  No apparent cellulitis or abscess of the right leg. Feet appeared normal.  Psychiatric: She has a normal mood and affect. Her behavior is normal. Judgment and thought content normal.    ED Course   Procedures (including critical care time)  Medications  0.9 %  sodium chloride infusion ( Intravenous Stopped 08/03/13 2129)  sodium chloride 0.9 % bolus 1,000 mL (0 mLs Intravenous Stopped 08/03/13 2129)  enoxaparin (LOVENOX) injection 100 mg (100 mg Subcutaneous Given 08/03/13 2057)    Patient Vitals for the past 24 hrs:  BP Temp Temp src Pulse Resp SpO2 Height Weight  08/03/13 1914 89/54 mmHg 97.3 F (36.3 C) Oral 79 20 93 % - -  08/03/13 1702 94/65 mmHg - - 77 20 100 % 5\' 6"  (1.676 m) 234 lb (106.142 kg)      Labs Reviewed  CBC WITH DIFFERENTIAL - Abnormal; Notable for the following:    Hemoglobin 15.1 (*)    Platelets 149 (*)    All other components within normal limits  BASIC METABOLIC PANEL - Abnormal; Notable for the following:    Glucose, Bld 135 (*)    Creatinine, Ser 2.21 (*)    GFR calc non Af Amer 24 (*)    GFR calc Af Amer 28 (*)    All other components within normal limits  URINALYSIS, ROUTINE W REFLEX MICROSCOPIC - Abnormal; Notable for the following:    Color, Urine AMBER (*)    APPearance HAZY (*)    Specific Gravity, Urine >1.030 (*)    Bilirubin Urine MODERATE (*)    Ketones, ur TRACE (*)    All other components within normal limits  PROTIME-INR    1. Leg swelling     MDM  Which is consistent with right leg DVT. Duplex imaging is not available at this hour. She is stable for discharge after  treatment with a dose of Lovenox. Plan is to have her return in the morning for duplex imaging and initiate treatment if there is a clot. Doubt cellulitis,  lumbar radiculopathy or fracture. She is stable for discharge.  Nursing Notes Reviewed/ Care Coordinated, and agree without changes. Applicable Imaging Reviewed.  Interpretation of Laboratory Data incorporated into ED treatment   Plan: Home Medications- usual; Home Treatments and Observation- rest, elevation; return here if the recommended treatment, does not improve the symptoms; Recommended follow up- duplex in the morning, then PCP as needed.    Richarda Blade, MD 08/04/13 0010

## 2013-08-03 NOTE — ED Notes (Signed)
Discharge instructions reviewed with pt, questions answered. Pt verbalized understanding.  

## 2013-08-03 NOTE — ED Notes (Signed)
Pt co RT leg pain, tenderness, and leg hot to the touch x 1 day. Leg is noticeably warmer than LT leg and appears swollen at calf.

## 2013-08-03 NOTE — ED Notes (Signed)
Right leg pain for 3 days, states she is unable to eat today

## 2013-08-03 NOTE — ED Notes (Signed)
Pt given water, Sprite, and crackers

## 2013-08-04 ENCOUNTER — Ambulatory Visit (HOSPITAL_COMMUNITY)
Admission: RE | Admit: 2013-08-04 | Discharge: 2013-08-04 | Disposition: A | Discharge status: Home / Self Care | Payer: Medicaid Other | Source: Ambulatory Visit | Attending: *Deleted | Admitting: *Deleted

## 2013-08-04 ENCOUNTER — Ambulatory Visit (HOSPITAL_COMMUNITY): Admit: 2013-08-04 | Payer: Medicaid Other

## 2013-08-04 DIAGNOSIS — E041 Nontoxic single thyroid nodule: Secondary | ICD-10-CM

## 2013-08-05 ENCOUNTER — Ambulatory Visit (HOSPITAL_COMMUNITY): Payer: Medicaid Other

## 2013-08-06 ENCOUNTER — Emergency Department (HOSPITAL_COMMUNITY)
Admission: EM | Admit: 2013-08-06 | Discharge: 2013-08-06 | Disposition: A | Discharge status: Home / Self Care | Payer: Medicaid Other | Attending: Emergency Medicine | Admitting: Emergency Medicine

## 2013-08-06 ENCOUNTER — Encounter (HOSPITAL_COMMUNITY): Payer: Self-pay | Admitting: *Deleted

## 2013-08-06 ENCOUNTER — Ambulatory Visit (HOSPITAL_COMMUNITY)
Admission: RE | Admit: 2013-08-06 | Discharge: 2013-08-06 | Disposition: A | Discharge status: Home / Self Care | Payer: Medicaid Other | Source: Ambulatory Visit | Attending: Emergency Medicine | Admitting: Emergency Medicine

## 2013-08-06 DIAGNOSIS — Z7901 Long term (current) use of anticoagulants: Secondary | ICD-10-CM | POA: Insufficient documentation

## 2013-08-06 DIAGNOSIS — Z79899 Other long term (current) drug therapy: Secondary | ICD-10-CM | POA: Insufficient documentation

## 2013-08-06 DIAGNOSIS — I824Y9 Acute embolism and thrombosis of unspecified deep veins of unspecified proximal lower extremity: Secondary | ICD-10-CM | POA: Insufficient documentation

## 2013-08-06 DIAGNOSIS — Z8669 Personal history of other diseases of the nervous system and sense organs: Secondary | ICD-10-CM | POA: Insufficient documentation

## 2013-08-06 DIAGNOSIS — I252 Old myocardial infarction: Secondary | ICD-10-CM | POA: Insufficient documentation

## 2013-08-06 DIAGNOSIS — Z862 Personal history of diseases of the blood and blood-forming organs and certain disorders involving the immune mechanism: Secondary | ICD-10-CM | POA: Insufficient documentation

## 2013-08-06 DIAGNOSIS — Z8679 Personal history of other diseases of the circulatory system: Secondary | ICD-10-CM | POA: Insufficient documentation

## 2013-08-06 DIAGNOSIS — Z7982 Long term (current) use of aspirin: Secondary | ICD-10-CM | POA: Insufficient documentation

## 2013-08-06 DIAGNOSIS — Z8673 Personal history of transient ischemic attack (TIA), and cerebral infarction without residual deficits: Secondary | ICD-10-CM | POA: Insufficient documentation

## 2013-08-06 DIAGNOSIS — R5381 Other malaise: Secondary | ICD-10-CM | POA: Insufficient documentation

## 2013-08-06 DIAGNOSIS — I10 Essential (primary) hypertension: Secondary | ICD-10-CM | POA: Insufficient documentation

## 2013-08-06 DIAGNOSIS — I82401 Acute embolism and thrombosis of unspecified deep veins of right lower extremity: Secondary | ICD-10-CM

## 2013-08-06 DIAGNOSIS — F17200 Nicotine dependence, unspecified, uncomplicated: Secondary | ICD-10-CM | POA: Insufficient documentation

## 2013-08-06 DIAGNOSIS — E119 Type 2 diabetes mellitus without complications: Secondary | ICD-10-CM | POA: Insufficient documentation

## 2013-08-06 LAB — PROTIME-INR
INR: 1.09 (ref 0.00–1.49)
Prothrombin Time: 13.9 seconds (ref 11.6–15.2)

## 2013-08-06 MED ORDER — WARFARIN SODIUM 5 MG PO TABS
ORAL_TABLET | ORAL | Status: AC
  Filled 2013-08-06: qty 1

## 2013-08-06 MED ORDER — HYDROCODONE-ACETAMINOPHEN 5-325 MG PO TABS: 1.0000 | ORAL_TABLET | Freq: Four times a day (QID) | ORAL | Status: DC | PRN

## 2013-08-06 MED ORDER — ENOXAPARIN SODIUM 100 MG/ML ~~LOC~~ SOLN
1.0000 mg/kg | Freq: Once | SUBCUTANEOUS | Status: AC
  Administered 2013-08-06: 105 mg via SUBCUTANEOUS
  Filled 2013-08-06: qty 2

## 2013-08-06 MED ORDER — HYDROCODONE-ACETAMINOPHEN 5-325 MG PO TABS
1.0000 | ORAL_TABLET | Freq: Once | ORAL | Status: AC
  Administered 2013-08-06: 1 via ORAL
  Filled 2013-08-06: qty 1

## 2013-08-06 MED ORDER — ENOXAPARIN SODIUM 100 MG/ML ~~LOC~~ SOLN: 100.0000 mg | Freq: Two times a day (BID) | SUBCUTANEOUS | Status: DC

## 2013-08-06 MED ORDER — WARFARIN SODIUM 5 MG PO TABS
5.0000 mg | ORAL_TABLET | Freq: Once | ORAL | Status: AC
  Administered 2013-08-06: 5 mg via ORAL

## 2013-08-06 MED ORDER — WARFARIN SODIUM 5 MG PO TABS: 5.0000 mg | ORAL_TABLET | Freq: Every day | ORAL | Status: DC

## 2013-08-06 MED ORDER — ENOXAPARIN (LOVENOX) PATIENT EDUCATION KIT: 1.0000 | PACK | Freq: Once | Status: DC

## 2013-08-06 NOTE — ED Notes (Signed)
Rt leg pain for 6 days. Had U/S done today . Positive for DVT  Seen here 8/5

## 2013-08-06 NOTE — ED Notes (Signed)
Unable to locate pt  

## 2013-08-06 NOTE — ED Provider Notes (Addendum)
CSN: [id number]     Arrival date & time 08/06/13  1558 History     First MD Initiated Contact with Patient 08/06/13 1603     Chief Complaint  Patient presents with  . DVT   (Consider location/radiation/quality/duration/timing/severity/associated sxs/prior Treatment) The history is provided by the patient.   52 year old female followed by health department. Patient with right leg pain and swelling for 6 days. Patient ultrasound Doppler study done today positive for deep vein thrombosis. Patient was originally seen in the emergency department on August 5. Patient had trouble getting her ultrasound scheduled that explains the delay. With her first visit she was started on Lovenox but has not been continued. Patient does not have any history of shortness of breath or chest pain and no prior history of deep vein thrombosis. Patient does describe a discomfort in the right leg 6/10 along with the swelling no thigh swelling. All swelling is to the calf with tenderness to the calf.  Past Medical History  Diagnosis Date  . Hypertension   . Myocardial infarct, old   . Tobacco abuse   . Crack cocaine use   . Acute ischemic stroke 07/03/2012  . Hemiparesis, acute 07/04/2012  . Thrombocytopenia 07/03/2012  . LVH (left ventricular hypertrophy) 07/04/2012    Ejection fraction 60%.  . Diabetes   . Diabetes mellitus    Past Surgical History  Procedure Laterality Date  . Abdominal hysterectomy     History reviewed. No pertinent family history. History  Substance Use Topics  . Smoking status: Current Every Day Smoker    Types: Cigarettes  . Smokeless tobacco: Not on file  . Alcohol Use: No   OB History   Grav Para Term Preterm Abortions TAB SAB Ect Mult Living                 Review of Systems  Constitutional: Negative for fever.  HENT: Negative for congestion and neck pain.   Eyes: Negative for visual disturbance.  Respiratory: Negative for shortness of breath.   Cardiovascular: Positive for  leg swelling. Negative for chest pain.  Gastrointestinal: Negative for nausea, vomiting and abdominal pain.  Genitourinary: Negative for hematuria.  Musculoskeletal: Negative for back pain.  Skin: Negative for rash.  Neurological: Negative for headaches.  Hematological: Does not bruise/bleed easily.  Psychiatric/Behavioral: Negative for confusion.    Allergies  Review of patient's allergies indicates no known allergies.  Home Medications   Current Outpatient Rx  Name  Route  Sig  Dispense  Refill  . albuterol (PROVENTIL HFA;VENTOLIN HFA) 108 (90 BASE) MCG/ACT inhaler   Inhalation   Inhale 2 puffs into the lungs every 4 (four) hours as needed for wheezing or shortness of breath.   1 Inhaler   0   . amLODipine (NORVASC) 10 MG tablet   Oral   Take 10 mg by mouth daily.         [name] aspirin 325 MG tablet   Oral   Take 1 tablet (325 mg total) by mouth daily.         [name] atorvastatin (LIPITOR) 10 MG tablet   Oral   Take 10 mg by mouth at bedtime.         . cloNIDine (CATAPRES) 0.2 MG tablet   Oral   Take 0.2 mg by mouth 2 (two) times daily.         [name] lisinopril-hydrochlorothiazide (PRINZIDE,ZESTORETIC) 20-12.5 MG per tablet   Oral   Take 2 tablets by mouth daily.         [name]  metFORMIN (GLUCOPHAGE-XR) 500 MG 24 hr tablet   Oral   Take 500 mg by mouth daily.         [name] enoxaparin (LOVENOX) 100 MG/ML injection   Subcutaneous   Inject 1 mL (100 mg total) into the skin every 12 (twelve) hours.   10 Syringe   1   . enoxaparin (LOVENOX) KIT   Does not apply   1 kit by Does not apply route once.   1 kit   0   . traMADol (ULTRAM) 50 MG tablet   Oral   Take 50 mg by mouth every 6 (six) hours as needed for pain.         [name] warfarin (COUMADIN) 5 MG tablet   Oral   Take 1 tablet (5 mg total) by mouth daily.   30 tablet   0    BP 104/85  Pulse 89  Temp(Src) 98 F (36.7 C) (Oral)  Resp 20  Ht 5\' 6"  (1.676 m)  Wt 234 lb (106.142 kg)  BMI 37.79 kg/m2  SpO2  99% Physical Exam  Nursing note and vitals reviewed. Constitutional: She is oriented to person, place, and time. She appears well-developed and well-nourished. No distress.  HENT:  Head: Normocephalic and atraumatic.  Mouth/Throat: Oropharynx is clear and moist.  Eyes: Conjunctivae and EOM are normal. Pupils are equal, round, and reactive to light.  Neck: Normal range of motion.  Cardiovascular: Normal rate, regular rhythm and normal heart sounds.   Pulmonary/Chest: Effort normal and breath sounds normal. No respiratory distress.  Abdominal: Soft. Bowel sounds are normal. There is no tenderness.  Musculoskeletal: She exhibits edema and tenderness.  Swelling and tenderness to the right calf. Dorsalis pedis pulses 2+ in that leg sensation intact. No thigh tenderness or swelling.  Neurological: She is alert and oriented to person, place, and time. No cranial nerve deficit. She exhibits normal muscle tone. Coordination normal.  Skin: Skin is warm. No erythema.    ED Course   Procedures (including critical care time)  Labs Reviewed  PROTIME-INR   US Venous Img Lower Unilateral Right  08/06/2013   *RADIOLOGY REPORT*  Clinical Data: Right leg pain and swelling  RIGHT LOWER EXTREMITY VENOUS DUPLEX ULTRASOUND  Technique:  Gray-scale sonography with graded compression, as well as color Doppler and duplex ultrasound, were performed to evaluate the deep venous system of the lower extremity from the level of the common femoral vein through the popliteal and proximal calf veins. Spectral Doppler was utilized to evaluate flow at rest and with distal augmentation maneuvers.  Comparison:  None  Findings: Occlusive hypoechoic thrombus identified within the right femoral vein through right popliteal vein.  These vessels demonstrate absent spontaneous venous flow and are noncompressible. Common femoral and profunda femoral veins appear patent. Visualized portion of the greater saphenous system is patent and  compressible.  Augmentation maneuvers not performed.  IMPRESSION: Occlusive deep venous thrombosis involving the right femoral and popliteal veins. Findings called to Dr. Jeanell Sparrow on 08/06/2013 at 1540 hours.   Original Report Authenticated By: Lavonia Dana, M.D.   Results for orders placed during the hospital encounter of 08/06/13  PROTIME-INR      Result Value Range   Prothrombin Time 13.9  11.6 - 15.2 seconds   INR 1.09  0.00 - 1.49      1. DVT (deep venous thrombosis), right     MDM  The patient with a documented DVT with thrombosis popliteal veins and right femoral vein. Patient without chest pain  or shortness of breath therefore no evidence of pulmonary embolism. Patient treated with Lovenox 100 mg here in the ED her son was instructed on how to continue to get this arranged. Patient has Lovenox ordered along with a Lovenox instruction kit she has enough to take for the next 5 days and has a refill. Patient also started on Coumadin 5 mg once a day we'll continue that. Patient will followup with her doctor in the next 3-4 days. Patient will return for any newer worse symptoms. Most likely cause of the DVT is patient suffered a stroke in the past with some weakness on the right side the stroke occurred a couple years ago. This is most likely her risk factor.   Mervin Kung, MD 08/06/13 2017  Mervin Kung, MD 08/06/13 2022

## 2013-09-09 ENCOUNTER — Ambulatory Visit (HOSPITAL_COMMUNITY)
Admission: RE | Admit: 2013-09-09 | Discharge: 2013-09-09 | Disposition: A | Discharge status: Home / Self Care | Payer: Medicaid Other | Source: Ambulatory Visit | Attending: Neurology | Admitting: Neurology

## 2013-09-09 ENCOUNTER — Other Ambulatory Visit: Payer: Self-pay | Admitting: Neurology

## 2013-09-09 DIAGNOSIS — M545 Low back pain, unspecified: Secondary | ICD-10-CM

## 2014-08-16 ENCOUNTER — Other Ambulatory Visit (HOSPITAL_COMMUNITY): Payer: Self-pay | Admitting: *Deleted

## 2014-08-16 DIAGNOSIS — Z1231 Encounter for screening mammogram for malignant neoplasm of breast: Secondary | ICD-10-CM

## 2014-08-19 ENCOUNTER — Ambulatory Visit (HOSPITAL_COMMUNITY)
Admission: RE | Admit: 2014-08-19 | Discharge: 2014-08-19 | Disposition: A | Discharge status: Home / Self Care | Payer: Medicaid Other | Source: Ambulatory Visit | Attending: *Deleted | Admitting: *Deleted

## 2014-08-19 DIAGNOSIS — Z1231 Encounter for screening mammogram for malignant neoplasm of breast: Secondary | ICD-10-CM | POA: Insufficient documentation

## 2014-09-22 DIAGNOSIS — E119 Type 2 diabetes mellitus without complications: Secondary | ICD-10-CM | POA: Diagnosis not present

## 2014-09-22 DIAGNOSIS — H2510 Age-related nuclear cataract, unspecified eye: Secondary | ICD-10-CM | POA: Diagnosis not present

## 2015-01-02 DIAGNOSIS — Z7901 Long term (current) use of anticoagulants: Secondary | ICD-10-CM | POA: Diagnosis not present

## 2015-01-18 DIAGNOSIS — Z7901 Long term (current) use of anticoagulants: Secondary | ICD-10-CM | POA: Diagnosis not present

## 2015-01-19 DIAGNOSIS — L603 Nail dystrophy: Secondary | ICD-10-CM | POA: Diagnosis not present

## 2015-01-19 DIAGNOSIS — E1151 Type 2 diabetes mellitus with diabetic peripheral angiopathy without gangrene: Secondary | ICD-10-CM | POA: Diagnosis not present

## 2015-01-19 DIAGNOSIS — M19079 Primary osteoarthritis, unspecified ankle and foot: Secondary | ICD-10-CM | POA: Diagnosis not present

## 2015-01-19 DIAGNOSIS — I739 Peripheral vascular disease, unspecified: Secondary | ICD-10-CM | POA: Diagnosis not present

## 2015-01-19 DIAGNOSIS — L03039 Cellulitis of unspecified toe: Secondary | ICD-10-CM | POA: Diagnosis not present

## 2015-01-19 DIAGNOSIS — R269 Unspecified abnormalities of gait and mobility: Secondary | ICD-10-CM | POA: Diagnosis not present

## 2015-02-01 DIAGNOSIS — Z7901 Long term (current) use of anticoagulants: Secondary | ICD-10-CM | POA: Diagnosis not present

## 2015-03-08 DIAGNOSIS — Z5181 Encounter for therapeutic drug level monitoring: Secondary | ICD-10-CM | POA: Diagnosis not present

## 2015-03-08 DIAGNOSIS — M25561 Pain in right knee: Secondary | ICD-10-CM | POA: Diagnosis not present

## 2015-03-08 DIAGNOSIS — I10 Essential (primary) hypertension: Secondary | ICD-10-CM | POA: Diagnosis not present

## 2015-03-21 ENCOUNTER — Emergency Department (HOSPITAL_COMMUNITY): Payer: Medicare Other

## 2015-03-21 ENCOUNTER — Inpatient Hospital Stay (HOSPITAL_COMMUNITY)
Admission: EM | Admit: 2015-03-21 | Discharge: 2015-03-25 | DRG: 190 | Disposition: A | Discharge status: Home / Self Care | Payer: Medicare Other | Attending: Internal Medicine | Admitting: Internal Medicine

## 2015-03-21 ENCOUNTER — Encounter (HOSPITAL_COMMUNITY): Payer: Self-pay | Admitting: Emergency Medicine

## 2015-03-21 DIAGNOSIS — I2510 Atherosclerotic heart disease of native coronary artery without angina pectoris: Secondary | ICD-10-CM | POA: Diagnosis present

## 2015-03-21 DIAGNOSIS — J9601 Acute respiratory failure with hypoxia: Secondary | ICD-10-CM | POA: Diagnosis not present

## 2015-03-21 DIAGNOSIS — I4892 Unspecified atrial flutter: Secondary | ICD-10-CM | POA: Diagnosis present

## 2015-03-21 DIAGNOSIS — Z87891 Personal history of nicotine dependence: Secondary | ICD-10-CM | POA: Diagnosis not present

## 2015-03-21 DIAGNOSIS — I4891 Unspecified atrial fibrillation: Secondary | ICD-10-CM | POA: Diagnosis present

## 2015-03-21 DIAGNOSIS — Z7982 Long term (current) use of aspirin: Secondary | ICD-10-CM

## 2015-03-21 DIAGNOSIS — J811 Chronic pulmonary edema: Secondary | ICD-10-CM | POA: Diagnosis not present

## 2015-03-21 DIAGNOSIS — R0602 Shortness of breath: Secondary | ICD-10-CM | POA: Diagnosis not present

## 2015-03-21 DIAGNOSIS — Z86718 Personal history of other venous thrombosis and embolism: Secondary | ICD-10-CM | POA: Diagnosis not present

## 2015-03-21 DIAGNOSIS — J111 Influenza due to unidentified influenza virus with other respiratory manifestations: Secondary | ICD-10-CM | POA: Diagnosis present

## 2015-03-21 DIAGNOSIS — Z8673 Personal history of transient ischemic attack (TIA), and cerebral infarction without residual deficits: Secondary | ICD-10-CM

## 2015-03-21 DIAGNOSIS — E860 Dehydration: Secondary | ICD-10-CM | POA: Diagnosis present

## 2015-03-21 DIAGNOSIS — Z79891 Long term (current) use of opiate analgesic: Secondary | ICD-10-CM | POA: Diagnosis not present

## 2015-03-21 DIAGNOSIS — Z823 Family history of stroke: Secondary | ICD-10-CM

## 2015-03-21 DIAGNOSIS — T380X5A Adverse effect of glucocorticoids and synthetic analogues, initial encounter: Secondary | ICD-10-CM | POA: Diagnosis present

## 2015-03-21 DIAGNOSIS — J45909 Unspecified asthma, uncomplicated: Secondary | ICD-10-CM | POA: Diagnosis not present

## 2015-03-21 DIAGNOSIS — J984 Other disorders of lung: Secondary | ICD-10-CM | POA: Diagnosis not present

## 2015-03-21 DIAGNOSIS — Z7901 Long term (current) use of anticoagulants: Secondary | ICD-10-CM | POA: Diagnosis not present

## 2015-03-21 DIAGNOSIS — I252 Old myocardial infarction: Secondary | ICD-10-CM

## 2015-03-21 DIAGNOSIS — J439 Emphysema, unspecified: Secondary | ICD-10-CM | POA: Diagnosis not present

## 2015-03-21 DIAGNOSIS — Z9981 Dependence on supplemental oxygen: Secondary | ICD-10-CM

## 2015-03-21 DIAGNOSIS — J441 Chronic obstructive pulmonary disease with (acute) exacerbation: Secondary | ICD-10-CM | POA: Diagnosis not present

## 2015-03-21 DIAGNOSIS — J9600 Acute respiratory failure, unspecified whether with hypoxia or hypercapnia: Secondary | ICD-10-CM | POA: Diagnosis present

## 2015-03-21 DIAGNOSIS — F17200 Nicotine dependence, unspecified, uncomplicated: Secondary | ICD-10-CM | POA: Diagnosis not present

## 2015-03-21 DIAGNOSIS — F17210 Nicotine dependence, cigarettes, uncomplicated: Secondary | ICD-10-CM | POA: Diagnosis present

## 2015-03-21 DIAGNOSIS — E119 Type 2 diabetes mellitus without complications: Secondary | ICD-10-CM | POA: Diagnosis present

## 2015-03-21 DIAGNOSIS — N179 Acute kidney failure, unspecified: Secondary | ICD-10-CM | POA: Diagnosis present

## 2015-03-21 DIAGNOSIS — Z720 Tobacco use: Secondary | ICD-10-CM | POA: Diagnosis present

## 2015-03-21 DIAGNOSIS — I517 Cardiomegaly: Secondary | ICD-10-CM | POA: Diagnosis not present

## 2015-03-21 DIAGNOSIS — I10 Essential (primary) hypertension: Secondary | ICD-10-CM | POA: Diagnosis not present

## 2015-03-21 DIAGNOSIS — J1189 Influenza due to unidentified influenza virus with other manifestations: Secondary | ICD-10-CM | POA: Diagnosis not present

## 2015-03-21 HISTORY — DX: Acute embolism and thrombosis of unspecified deep veins of unspecified lower extremity: I82.409

## 2015-03-21 LAB — BLOOD GAS, ARTERIAL
Acid-base deficit: 0.1 mmol/L (ref 0.0–2.0)
Bicarbonate: 23.6 mEq/L (ref 20.0–24.0)
DRAWN BY: 27407
O2 CONTENT: 2.5 L/min
O2 SAT: 92.2 %
PATIENT TEMPERATURE: 37
PH ART: 7.441 (ref 7.350–7.450)
PO2 ART: 64.8 mmHg — AB (ref 80.0–100.0)
TCO2: 19.9 mmol/L (ref 0–100)
pCO2 arterial: 35.2 mmHg (ref 35.0–45.0)

## 2015-03-21 LAB — INFLUENZA PANEL BY PCR (TYPE A & B)
H1N1FLUPCR: DETECTED — AB
INFLAPCR: POSITIVE — AB
Influenza B By PCR: NEGATIVE

## 2015-03-21 LAB — CBC
HEMATOCRIT: 50.1 % — AB (ref 36.0–46.0)
HEMOGLOBIN: 16.5 g/dL — AB (ref 12.0–15.0)
MCH: 28.9 pg (ref 26.0–34.0)
MCHC: 32.9 g/dL (ref 30.0–36.0)
MCV: 87.9 fL (ref 78.0–100.0)
Platelets: 150 10*3/uL (ref 150–400)
RBC: 5.7 MIL/uL — AB (ref 3.87–5.11)
RDW: 14.5 % (ref 11.5–15.5)
WBC: 7.6 10*3/uL (ref 4.0–10.5)

## 2015-03-21 LAB — BASIC METABOLIC PANEL
ANION GAP: 13 (ref 5–15)
BUN: 21 mg/dL (ref 6–23)
CALCIUM: 9 mg/dL (ref 8.4–10.5)
CHLORIDE: 100 mmol/L (ref 96–112)
CO2: 24 mmol/L (ref 19–32)
CREATININE: 1.37 mg/dL — AB (ref 0.50–1.10)
GFR calc Af Amer: 50 mL/min — ABNORMAL LOW (ref >90)
GFR calc non Af Amer: 43 mL/min — ABNORMAL LOW (ref >90)
Glucose, Bld: 174 mg/dL — ABNORMAL HIGH (ref 70–99)
Potassium: 3.7 mmol/L (ref 3.5–5.1)
SODIUM: 137 mmol/L (ref 135–145)

## 2015-03-21 LAB — PROTIME-INR
INR: 1.78 — ABNORMAL HIGH (ref 0.00–1.49)
Prothrombin Time: 20.9 seconds — ABNORMAL HIGH (ref 11.6–15.2)

## 2015-03-21 LAB — DIFFERENTIAL
BASOS PCT: 0 % (ref 0–1)
Basophils Absolute: 0 10*3/uL (ref 0.0–0.1)
Eosinophils Absolute: 0 10*3/uL (ref 0.0–0.7)
Eosinophils Relative: 0 % (ref 0–5)
LYMPHS ABS: 0.3 10*3/uL — AB (ref 0.7–4.0)
LYMPHS PCT: 4 % — AB (ref 12–46)
MONOS PCT: 8 % (ref 3–12)
Monocytes Absolute: 0.6 10*3/uL (ref 0.1–1.0)
NEUTROS ABS: 6.7 10*3/uL (ref 1.7–7.7)
NEUTROS PCT: 88 % — AB (ref 43–77)

## 2015-03-21 LAB — GLUCOSE, CAPILLARY: Glucose-Capillary: 184 mg/dL — ABNORMAL HIGH (ref 70–99)

## 2015-03-21 LAB — MRSA PCR SCREENING: MRSA by PCR: NEGATIVE

## 2015-03-21 LAB — I-STAT CG4 LACTIC ACID, ED: Lactic Acid, Venous: 2.47 mmol/L (ref 0.5–2.0)

## 2015-03-21 LAB — TROPONIN I: Troponin I: 0.11 ng/mL — ABNORMAL HIGH (ref <0.031)

## 2015-03-21 LAB — BRAIN NATRIURETIC PEPTIDE: B Natriuretic Peptide: 88 pg/mL (ref 0.0–100.0)

## 2015-03-21 MED ORDER — LORAZEPAM 2 MG/ML IJ SOLN
1.0000 mg | Freq: Once | INTRAMUSCULAR | Status: AC
  Administered 2015-03-21: 1 mg via INTRAVENOUS
  Filled 2015-03-21: qty 1

## 2015-03-21 MED ORDER — ALBUTEROL SULFATE (2.5 MG/3ML) 0.083% IN NEBU: 2.5000 mg | INHALATION_SOLUTION | RESPIRATORY_TRACT | Status: DC | PRN

## 2015-03-21 MED ORDER — ASPIRIN 81 MG PO CHEW
324.0000 mg | CHEWABLE_TABLET | Freq: Once | ORAL | Status: AC
  Administered 2015-03-21: 324 mg via ORAL
  Filled 2015-03-21: qty 4

## 2015-03-21 MED ORDER — DEXTROSE 5 % IV SOLN
500.0000 mg | Freq: Once | INTRAVENOUS | Status: AC
  Administered 2015-03-21: 500 mg via INTRAVENOUS
  Filled 2015-03-21: qty 500

## 2015-03-21 MED ORDER — AMLODIPINE BESYLATE 5 MG PO TABS
10.0000 mg | ORAL_TABLET | Freq: Every day | ORAL | Status: DC
  Administered 2015-03-22 – 2015-03-25 (×4): 10 mg via ORAL
  Filled 2015-03-21 (×4): qty 2

## 2015-03-21 MED ORDER — ACETAMINOPHEN 500 MG PO TABS
1000.0000 mg | ORAL_TABLET | Freq: Once | ORAL | Status: AC
  Administered 2015-03-21: 1000 mg via ORAL
  Filled 2015-03-21: qty 2

## 2015-03-21 MED ORDER — GABAPENTIN 300 MG PO CAPS
300.0000 mg | ORAL_CAPSULE | Freq: Three times a day (TID) | ORAL | Status: DC
  Administered 2015-03-21 – 2015-03-25 (×12): 300 mg via ORAL
  Filled 2015-03-21 (×12): qty 1

## 2015-03-21 MED ORDER — ONDANSETRON HCL 4 MG/2ML IJ SOLN: 4.0000 mg | Freq: Four times a day (QID) | INTRAMUSCULAR | Status: DC | PRN

## 2015-03-21 MED ORDER — NICOTINE 21 MG/24HR TD PT24
21.0000 mg | MEDICATED_PATCH | Freq: Every day | TRANSDERMAL | Status: DC
  Administered 2015-03-21 – 2015-03-25 (×5): 21 mg via TRANSDERMAL
  Filled 2015-03-21 (×5): qty 1

## 2015-03-21 MED ORDER — LEVALBUTEROL HCL 0.63 MG/3ML IN NEBU
0.6300 mg | INHALATION_SOLUTION | Freq: Four times a day (QID) | RESPIRATORY_TRACT | Status: DC
  Administered 2015-03-21 – 2015-03-23 (×7): 0.63 mg via RESPIRATORY_TRACT
  Filled 2015-03-21 (×7): qty 3

## 2015-03-21 MED ORDER — LEVOFLOXACIN IN D5W 500 MG/100ML IV SOLN
500.0000 mg | INTRAVENOUS | Status: DC
  Administered 2015-03-22 – 2015-03-23 (×2): 500 mg via INTRAVENOUS
  Filled 2015-03-21 (×2): qty 100

## 2015-03-21 MED ORDER — DILTIAZEM LOAD VIA INFUSION
15.0000 mg | Freq: Once | INTRAVENOUS | Status: AC
  Administered 2015-03-21: 15 mg via INTRAVENOUS
  Filled 2015-03-21: qty 15

## 2015-03-21 MED ORDER — LEVOFLOXACIN IN D5W 500 MG/100ML IV SOLN: 500.0000 mg | INTRAVENOUS | Status: DC

## 2015-03-21 MED ORDER — WARFARIN SODIUM 7.5 MG PO TABS
7.5000 mg | ORAL_TABLET | Freq: Once | ORAL | Status: AC
  Administered 2015-03-21: 7.5 mg via ORAL
  Filled 2015-03-21: qty 1

## 2015-03-21 MED ORDER — DILTIAZEM HCL 100 MG IV SOLR
5.0000 mg/h | INTRAVENOUS | Status: DC
  Administered 2015-03-21: 5 mg/h via INTRAVENOUS
  Filled 2015-03-21: qty 100

## 2015-03-21 MED ORDER — SODIUM CHLORIDE 0.9 % IV SOLN
INTRAVENOUS | Status: DC
Start: 2015-03-21 — End: 2015-03-23
  Administered 2015-03-21 – 2015-03-22 (×2): via INTRAVENOUS

## 2015-03-21 MED ORDER — INSULIN ASPART 100 UNIT/ML ~~LOC~~ SOLN
0.0000 [IU] | Freq: Three times a day (TID) | SUBCUTANEOUS | Status: DC
  Administered 2015-03-21: 4 [IU] via SUBCUTANEOUS
  Administered 2015-03-22 (×2): 7 [IU] via SUBCUTANEOUS
  Administered 2015-03-22 – 2015-03-23 (×2): 4 [IU] via SUBCUTANEOUS
  Administered 2015-03-23 (×2): 7 [IU] via SUBCUTANEOUS
  Administered 2015-03-24: 11 [IU] via SUBCUTANEOUS
  Administered 2015-03-24: 4 [IU] via SUBCUTANEOUS
  Administered 2015-03-24: 15 [IU] via SUBCUTANEOUS
  Administered 2015-03-25: 11 [IU] via SUBCUTANEOUS
  Administered 2015-03-25: 7 [IU] via SUBCUTANEOUS

## 2015-03-21 MED ORDER — SODIUM CHLORIDE 0.9 % IJ SOLN
3.0000 mL | Freq: Two times a day (BID) | INTRAMUSCULAR | Status: DC
  Administered 2015-03-21 – 2015-03-25 (×6): 3 mL via INTRAVENOUS

## 2015-03-21 MED ORDER — HYDROCODONE-ACETAMINOPHEN 7.5-325 MG PO TABS: 1.0000 | ORAL_TABLET | ORAL | Status: DC | PRN

## 2015-03-21 MED ORDER — ALBUTEROL (5 MG/ML) CONTINUOUS INHALATION SOLN
10.0000 mg/h | INHALATION_SOLUTION | Freq: Once | RESPIRATORY_TRACT | Status: AC
  Administered 2015-03-21: 10 mg/h via RESPIRATORY_TRACT
  Filled 2015-03-21: qty 20

## 2015-03-21 MED ORDER — SODIUM CHLORIDE 0.9 % IV BOLUS (SEPSIS)
1000.0000 mL | Freq: Once | INTRAVENOUS | Status: AC
  Administered 2015-03-21: 1000 mL via INTRAVENOUS

## 2015-03-21 MED ORDER — IPRATROPIUM-ALBUTEROL 0.5-2.5 (3) MG/3ML IN SOLN
3.0000 mL | Freq: Once | RESPIRATORY_TRACT | Status: AC
Start: 2015-03-21 — End: 2015-03-21
  Administered 2015-03-21: 3 mL via RESPIRATORY_TRACT
  Filled 2015-03-21: qty 3

## 2015-03-21 MED ORDER — ATORVASTATIN CALCIUM 10 MG PO TABS
10.0000 mg | ORAL_TABLET | Freq: Every day | ORAL | Status: DC
  Administered 2015-03-21 – 2015-03-24 (×4): 10 mg via ORAL
  Filled 2015-03-21 (×4): qty 1

## 2015-03-21 MED ORDER — HYDROCODONE-ACETAMINOPHEN 7.5-325 MG PO TABS
1.0000 | ORAL_TABLET | Freq: Four times a day (QID) | ORAL | Status: DC | PRN
Start: 2015-03-21 — End: 2015-03-25
  Administered 2015-03-21 – 2015-03-24 (×6): 1 via ORAL
  Filled 2015-03-21 (×6): qty 1

## 2015-03-21 MED ORDER — LISINOPRIL 10 MG PO TABS
20.0000 mg | ORAL_TABLET | Freq: Every day | ORAL | Status: DC
  Administered 2015-03-22 – 2015-03-25 (×4): 20 mg via ORAL
  Filled 2015-03-21 (×4): qty 2

## 2015-03-21 MED ORDER — IPRATROPIUM-ALBUTEROL 0.5-2.5 (3) MG/3ML IN SOLN
3.0000 mL | Freq: Once | RESPIRATORY_TRACT | Status: AC
  Administered 2015-03-21: 3 mL via RESPIRATORY_TRACT
  Filled 2015-03-21: qty 3

## 2015-03-21 MED ORDER — HYDROCHLOROTHIAZIDE 12.5 MG PO CAPS
12.5000 mg | ORAL_CAPSULE | Freq: Every day | ORAL | Status: DC
  Administered 2015-03-22 – 2015-03-25 (×4): 12.5 mg via ORAL
  Filled 2015-03-21 (×4): qty 1

## 2015-03-21 MED ORDER — IOHEXOL 350 MG/ML SOLN
100.0000 mL | Freq: Once | INTRAVENOUS | Status: AC | PRN
  Administered 2015-03-21: 100 mL via INTRAVENOUS

## 2015-03-21 MED ORDER — DILTIAZEM HCL 25 MG/5ML IV SOLN
10.0000 mg | Freq: Once | INTRAVENOUS | Status: AC
  Administered 2015-03-21: 10 mg via INTRAVENOUS
  Filled 2015-03-21: qty 5

## 2015-03-21 MED ORDER — LISINOPRIL-HYDROCHLOROTHIAZIDE 20-12.5 MG PO TABS: 2.0000 | ORAL_TABLET | Freq: Every day | ORAL | Status: DC

## 2015-03-21 MED ORDER — METHYLPREDNISOLONE SODIUM SUCC 125 MG IJ SOLR
125.0000 mg | Freq: Four times a day (QID) | INTRAMUSCULAR | Status: DC
  Administered 2015-03-21 – 2015-03-23 (×6): 125 mg via INTRAVENOUS
  Filled 2015-03-21 (×7): qty 2

## 2015-03-21 MED ORDER — CEFTRIAXONE SODIUM 1 G IJ SOLR
1.0000 g | Freq: Once | INTRAMUSCULAR | Status: AC
  Administered 2015-03-21: 1 g via INTRAVENOUS
  Filled 2015-03-21: qty 10

## 2015-03-21 MED ORDER — WARFARIN - PHARMACIST DOSING INPATIENT: Status: DC

## 2015-03-21 MED ORDER — ASPIRIN EC 81 MG PO TBEC
81.0000 mg | DELAYED_RELEASE_TABLET | Freq: Every day | ORAL | Status: DC
  Administered 2015-03-22 – 2015-03-25 (×4): 81 mg via ORAL
  Filled 2015-03-21 (×4): qty 1

## 2015-03-21 MED ORDER — LEVALBUTEROL HCL 0.63 MG/3ML IN NEBU: 0.6300 mg | INHALATION_SOLUTION | RESPIRATORY_TRACT | Status: DC | PRN

## 2015-03-21 MED ORDER — METOPROLOL TARTRATE 25 MG PO TABS
12.5000 mg | ORAL_TABLET | Freq: Two times a day (BID) | ORAL | Status: DC
  Administered 2015-03-21 – 2015-03-24 (×6): 12.5 mg via ORAL
  Filled 2015-03-21 (×6): qty 1

## 2015-03-21 MED ORDER — METFORMIN HCL ER 500 MG PO TB24
500.0000 mg | ORAL_TABLET | Freq: Every day | ORAL | Status: DC
  Administered 2015-03-24 – 2015-03-25 (×2): 500 mg via ORAL
  Filled 2015-03-21 (×4): qty 1

## 2015-03-21 MED ORDER — WARFARIN SODIUM 5 MG PO TABS: 5.0000 mg | ORAL_TABLET | Freq: Every day | ORAL | Status: DC

## 2015-03-21 MED ORDER — ALBUTEROL SULFATE HFA 108 (90 BASE) MCG/ACT IN AERS: 2.0000 | INHALATION_SPRAY | RESPIRATORY_TRACT | Status: DC | PRN

## 2015-03-21 MED ORDER — ONDANSETRON HCL 4 MG PO TABS: 4.0000 mg | ORAL_TABLET | Freq: Four times a day (QID) | ORAL | Status: DC | PRN

## 2015-03-21 MED ORDER — TRAZODONE HCL 50 MG PO TABS
50.0000 mg | ORAL_TABLET | Freq: Every evening | ORAL | Status: DC | PRN
  Administered 2015-03-21 – 2015-03-24 (×2): 50 mg via ORAL
  Filled 2015-03-21 (×3): qty 1

## 2015-03-21 MED ORDER — INSULIN ASPART 100 UNIT/ML ~~LOC~~ SOLN
0.0000 [IU] | Freq: Every day | SUBCUTANEOUS | Status: DC
  Administered 2015-03-23 – 2015-03-24 (×2): 2 [IU] via SUBCUTANEOUS

## 2015-03-21 MED ORDER — METHYLPREDNISOLONE SODIUM SUCC 125 MG IJ SOLR
125.0000 mg | Freq: Once | INTRAMUSCULAR | Status: AC
  Administered 2015-03-21: 125 mg via INTRAVENOUS
  Filled 2015-03-21: qty 2

## 2015-03-21 NOTE — Progress Notes (Signed)
eLink Physician-Brief Progress Note Patient Name: Tracy [name] DOB: [birth year] MRN: [id number]   Date of Service  03/21/2015  HPI/Events of Note  New fib / flutter rate 141  eICU Interventions  cardizem , ecg     Intervention Category Major Interventions: Arrhythmia - evaluation and management  Raylene Miyamoto. 03/21/2015, 7:53 PM

## 2015-03-21 NOTE — ED Notes (Signed)
Lab obtained blood cultures prior to start of antibiotics.

## 2015-03-21 NOTE — ED Provider Notes (Signed)
CSN: [id number]     Arrival date & time 03/21/15  8127 History  This chart was scribed for Tracy Gambler, MD by Zola Button, ED Scribe. This patient was seen in room APA02/APA02 and the patient's care was started at 10:18 AM.      Chief Complaint  Patient presents with  . Shortness of Breath   The history is provided by the patient and a relative. No language interpreter was used.   HPI Comments: Tracy [name] is a 54 y.o. female with a hx of DVT (right leg), MI, HTN, DM, and tobacco abuse who presents to the Emergency Department complaining of gradual onset SOB that started yesterday. Daughter notes that the patient was diagnosed with emphysema 2 years ago is supposed to be on 2L of O2 at home; however, patient has not been using O2 at home. Per daughter, patient has spells like this regularly. Patient is noted to have associated productive cough of yellow sputum, fever today and confusion. For example, daughter notes that the patient kept calling her the wrong name this morning. Patient denies leg swelling, chest pain and chills.   Past Medical History  Diagnosis Date  . Hypertension   . Myocardial infarct, old   . Tobacco abuse   . Crack cocaine use   . Acute ischemic stroke 07/03/2012  . Hemiparesis, acute 07/04/2012  . Thrombocytopenia 07/03/2012  . LVH (left ventricular hypertrophy) 07/04/2012    Ejection fraction 60%.  . Diabetes   . Diabetes mellitus   . DVT (deep venous thrombosis)    Past Surgical History  Procedure Laterality Date  . Abdominal hysterectomy     History reviewed. No pertinent family history. History  Substance Use Topics  . Smoking status: Current Every Day Smoker -- 1.00 packs/day    Types: Cigarettes  . Smokeless tobacco: Not on file  . Alcohol Use: No   OB History    No data available     Review of Systems  Constitutional: Positive for fever. Negative for chills.  Respiratory: Positive for cough and shortness of breath.   Cardiovascular:  Negative for chest pain and leg swelling.  Psychiatric/Behavioral: Positive for confusion.  All other systems reviewed and are negative.     Allergies  Review of patient's allergies indicates no known allergies.  Home Medications   Prior to Admission medications   Medication Sig Start Date End Date Taking? Authorizing Provider  albuterol (PROVENTIL HFA;VENTOLIN HFA) 108 (90 BASE) MCG/ACT inhaler Inhale 2 puffs into the lungs every 4 (four) hours as needed for wheezing or shortness of breath. 07/09/13   Samuella Cota, MD  amLODipine (NORVASC) 10 MG tablet Take 10 mg by mouth daily.    Historical Provider, MD  atorvastatin (LIPITOR) 10 MG tablet Take 10 mg by mouth at bedtime.    Historical Provider, MD  cloNIDine (CATAPRES) 0.2 MG tablet Take 0.2 mg by mouth 2 (two) times daily.    Historical Provider, MD  enoxaparin (LOVENOX) 100 MG/ML injection Inject 1 mL (100 mg total) into the skin every 12 (twelve) hours. 08/06/13   Fredia Sorrow, MD  enoxaparin (LOVENOX) KIT 1 kit by Does not apply route once. 08/06/13   Fredia Sorrow, MD  HYDROcodone-acetaminophen (NORCO/VICODIN) 5-325 MG per tablet Take 1-2 tablets by mouth every 6 (six) hours as needed for pain. 08/06/13   Fredia Sorrow, MD  lisinopril-hydrochlorothiazide (PRINZIDE,ZESTORETIC) 20-12.5 MG per tablet Take 2 tablets by mouth daily.    Historical Provider, MD  metFORMIN (GLUCOPHAGE-XR) 500 MG 24  hr tablet Take 500 mg by mouth daily.    Historical Provider, MD  traMADol (ULTRAM) 50 MG tablet Take 50 mg by mouth every 6 (six) hours as needed for pain.    Historical Provider, MD  warfarin (COUMADIN) 5 MG tablet Take 1 tablet (5 mg total) by mouth daily. 08/06/13   Fredia Sorrow, MD   BP 152/97 mmHg  Pulse 90  Temp(Src) 100.2 F (37.9 C) (Oral)  Resp 26  Ht 5' 6" (1.676 m)  Wt 258 lb (117.028 kg)  BMI 41.66 kg/m2  SpO2 78% Physical Exam  Constitutional: She is oriented to person, place, and time. She appears well-developed and  well-nourished. No distress.  HENT:  Head: Normocephalic and atraumatic.  Mouth/Throat: Oropharynx is clear and moist. No oropharyngeal exudate.  Neck: Neck supple.  Cardiovascular: Normal rate, regular rhythm and normal heart sounds.   Pulmonary/Chest: Effort normal. Tachypnea noted.  Diffusely decreased air movement. Tachypneic.  Abdominal: She exhibits no distension. There is no tenderness.  Musculoskeletal: She exhibits no edema.  Neurological: She is alert and oriented to person, place, and time. No cranial nerve deficit.  Skin: Skin is warm and dry. No rash noted.  Psychiatric: She has a normal mood and affect. Her behavior is normal.  Nursing note and vitals reviewed.   ED Course  Procedures  DIAGNOSTIC STUDIES: Oxygen Saturation is 78% on room air, low by my interpretation.    COORDINATION OF CARE: 10:23 AM-Discussed treatment plan which includes breathing treatment, CXR and labs with pt at bedside and pt agreed to plan.   Labs Review Labs Reviewed  CBC - Abnormal; Notable for the following:    RBC 5.70 (*)    Hemoglobin 16.5 (*)    HCT 50.1 (*)    All other components within normal limits  BASIC METABOLIC PANEL - Abnormal; Notable for the following:    Glucose, Bld 174 (*)    Creatinine, Ser 1.37 (*)    GFR calc non Af Amer 43 (*)    GFR calc Af Amer 50 (*)    All other components within normal limits  TROPONIN I - Abnormal; Notable for the following:    Troponin I 0.11 (*)    All other components within normal limits  PROTIME-INR - Abnormal; Notable for the following:    Prothrombin Time 20.9 (*)    INR 1.78 (*)    All other components within normal limits  BLOOD GAS, ARTERIAL - Abnormal; Notable for the following:    pO2, Arterial 64.8 (*)    All other components within normal limits  DIFFERENTIAL - Abnormal; Notable for the following:    Neutrophils Relative % 88 (*)    Lymphocytes Relative 4 (*)    Lymphs Abs 0.3 (*)    All other components within  normal limits  I-STAT CG4 LACTIC ACID, ED - Abnormal; Notable for the following:    Lactic Acid, Venous 2.47 (*)    All other components within normal limits  CULTURE, BLOOD (ROUTINE X 2)  CULTURE, BLOOD (ROUTINE X 2)  BRAIN NATRIURETIC PEPTIDE    Imaging Review Ct Angio Chest Pe W/cm &/or Wo Cm  03/21/2015   CLINICAL DATA:  54 year old female with shortness of breath cough and hypoxia since yesterday. Initial encounter. Current history of smoking. Personal history of DVT.  EXAM: CT ANGIOGRAPHY CHEST WITH CONTRAST  TECHNIQUE: Multidetector CT imaging of the chest was performed using the standard protocol during bolus administration of intravenous contrast. Multiplanar CT image reconstructions and  MIPs were obtained to evaluate the vascular anatomy.  CONTRAST:  135m OMNIPAQUE IOHEXOL 350 MG/ML SOLN  COMPARISON:  Chest radiographs 03/21/2015 and earlier. Chest CT 07/04/2013.  FINDINGS: Adequate contrast bolus timing in the pulmonary arterial tree. Respiratory motion artifact. The right pulmonary artery lower lobe and middle lobe branches appear diminutive and nonenhancing and most likely are thrombosed (compare series 10, image 45 today to series 2, image 32 in 2014). Allowing for motion artifact, other bilateral pulmonary artery branches appear to remain patent. There is mild central pulmonary artery enlargement.  Atelectatic changes to the major airways. Dependent atelectasis as well as bilateral lower lobe scarring suspected. Upper lobe centrilobular emphysema.  Cardiomegaly. No pericardial or pleural effusion. There is contrast reflux into the hepatic IVC and hepatic veins. Chronic right peritracheal lymph node enlargement appears stable since 2014. Thyromegaly. Tortuous proximal great vessels. Calcified atherosclerosis of the aorta.  No axillary lymphadenopathy. Negative visualized upper abdominal viscera.  No acute osseous abnormality identified. Degenerative changes in the spine.  Review of the MIP  images confirms the above findings.  IMPRESSION: 1. No evidence of acute pulmonary embolus. 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 2. Lower lobe scarring.  Upper lobe emphysema. 3. Cardiomegaly.   Electronically Signed   By: HGenevie AnnM.D.   On: 03/21/2015 14:53   Dg Chest Port 1 View  03/21/2015   CLINICAL DATA:  SOB X COUPLE DAYS, ASTHMA, EMPHYSEMA, HTN, SMOKER 1 PK/DAY  EXAM: PORTABLE CHEST - 1 VIEW  COMPARISON:  07/20/2013  FINDINGS: Increase in central pulmonary vascular congestion. No overt interstitial edema or confluent airspace disease. Stable cardiomegaly. Ectatic thoracic aorta. No effusion.  No pneumothorax. Spurring in the mid thoracic spine stable.  IMPRESSION: 1. Progressive pulmonary vascular congestion without focal infiltrate.   Electronically Signed   By: DLucrezia EuropeM.D.   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  Critical care was necessary to treat or prevent imminent or life-threatening deterioration.  Critical care was time spent personally by me on the following activities: development of treatment plan with patient and/or surrogate as well as nursing, discussions with consultants, evaluation of patient's response to treatment, examination of patient, obtaining history from patient or surrogate, ordering and performing treatments and interventions, ordering and review of laboratory studies, ordering and review of  radiographic studies, pulse oximetry and re-evaluation of patient's condition.  MDM   Final diagnoses:  COPD exacerbation     I believe most of the patient's dyspnea is related to his COPD exacerbation, possibly from pneumonia versus viral infection. It sounds like she is supposed to be on oxygen but is not taking this at home, comes in with hypoxia with oxygen  Saturations in the high 70s. Improved immediately with nasal cannula oxygen. Given unimpressive chest x-ray with significant hypoxia, CT obtained given prior history of DVT. She initially improved with intermittent albuterol nebulizers but when came back from  CT she had more work of breathing and wheezing. No stridor, rash, or change in voice to suggest this is related to the contrast. I believe that she is more opening up and needs more bronchodilators. The patient states she's not working any harder and feels like her "normal" but she does continue to have labored breathing. She will need admission to the hospital for further respiratory care. Does not need intubation at this time.  I personally performed the services described in this documentation, which was scribed in my presence. The recorded information has been reviewed and is accurate.    Tracy Gambler, MD 03/21/15 [phone]

## 2015-03-21 NOTE — ED Notes (Signed)
Pts breathing continues to be labored.  MD at bedside reassessing pt.

## 2015-03-21 NOTE — ED Notes (Signed)
Pts daughter reports that pt has been sob since yesterday.  Pt denies any symptoms.

## 2015-03-21 NOTE — H&P (Addendum)
[facility] History and Physical  Tracy [name] QZE:[id number] DOB: [birth year] DOA: 03/21/2015  Referring physician: ER PCP: Raiford Simmonds., PA-C   Chief Complaint: Dyspnea  HPI: Tracy [name] is a 54 y.o. female  This is a 54 year old lady, who has a history of COPD and continues to smoke cigarettes one pack of cigarettes per day, gives a 2 day history of dyspnea associated with cough which is intermittently productive of yellow sputum. She also has been feeling feverish today. She denies any chest pain, palpitations or leg swelling. There is also been some confusion, according to the daughter today. Patient is also diabetic, hypertensive, has a history of coronary artery disease and cerebrovascular disease and history of right leg DVT in the past. She is on warfarin. Despite bronchodilator treatment in the emergency room, she has not improved and she is hypoxic. She is now being admitted for further management.   Review of Systems:  Apart from symptoms above, all systems negative.  Past Medical History  Diagnosis Date  . Hypertension   . Myocardial infarct, old   . Tobacco abuse   . Crack cocaine use   . Acute ischemic stroke 07/03/2012  . Hemiparesis, acute 07/04/2012  . Thrombocytopenia 07/03/2012  . LVH (left ventricular hypertrophy) 07/04/2012    Ejection fraction 60%.  . Diabetes   . Diabetes mellitus   . DVT (deep venous thrombosis)    Past Surgical History  Procedure Laterality Date  . Abdominal hysterectomy     Social History:  reports that she has been smoking Cigarettes.  She has been smoking about 1.00 pack per day. She does not have any smokeless tobacco history on file. She reports that she uses illicit drugs. She reports that she does not drink alcohol.  No Known Allergies   Family history: Father died of a stroke, mother died from ruptured cerebral aneurysm. do not leave blank  Prior to Admission medications   Medication Sig Start Date End Date  Taking? Authorizing Provider  albuterol (PROVENTIL HFA;VENTOLIN HFA) 108 (90 BASE) MCG/ACT inhaler Inhale 2 puffs into the lungs every 4 (four) hours as needed for wheezing or shortness of breath. 07/09/13  Yes Samuella Cota, MD  aspirin EC 81 MG tablet Take 81 mg by mouth daily.   Yes Historical Provider, MD  atorvastatin (LIPITOR) 10 MG tablet Take 10 mg by mouth at bedtime.   Yes Historical Provider, MD  gabapentin (NEURONTIN) 300 MG capsule Take 300 mg by mouth 3 (three) times daily.   Yes Historical Provider, MD  HYDROcodone-acetaminophen (NORCO) 7.5-325 MG per tablet Take 1 tablet by mouth every 4 (four) hours as needed for moderate pain.   Yes Historical Provider, MD  lisinopril-hydrochlorothiazide (PRINZIDE,ZESTORETIC) 20-12.5 MG per tablet Take 2 tablets by mouth daily.   Yes Historical Provider, MD  metFORMIN (GLUCOPHAGE-XR) 500 MG 24 hr tablet Take 500 mg by mouth daily.   Yes Historical Provider, MD  metoprolol tartrate (LOPRESSOR) 25 MG tablet Take 12.5 mg by mouth 2 (two) times daily.   Yes Historical Provider, MD  traZODone (DESYREL) 50 MG tablet Take 50 mg by mouth at bedtime as needed for sleep.   Yes Historical Provider, MD  warfarin (COUMADIN) 5 MG tablet Take 1 tablet (5 mg total) by mouth daily. 08/06/13  Yes Fredia Sorrow, MD  enoxaparin (LOVENOX) 100 MG/ML injection Inject 1 mL (100 mg total) into the skin every 12 (twelve) hours. Patient not taking: Reported on 03/21/2015 08/06/13   Fredia Sorrow, MD  enoxaparin (LOVENOX) KIT  1 kit by Does not apply route once. Patient not taking: Reported on 03/21/2015 08/06/13   Fredia Sorrow, MD  HYDROcodone-acetaminophen (NORCO/VICODIN) 5-325 MG per tablet Take 1-2 tablets by mouth every 6 (six) hours as needed for pain. Patient not taking: Reported on 03/21/2015 08/06/13   Fredia Sorrow, MD   Physical Exam: Filed Vitals:   03/21/15 1431 03/21/15 1438 03/21/15 1500 03/21/15 1520  BP: 141/81 141/81  140/90  Pulse: 88 86  91  Temp:   98.7 F (37.1 C)    TempSrc:  Oral  Oral  Resp: 34 27  34  Height:      Weight:      SpO2: 92% 94% 95% 98%    Wt Readings from Last 3 Encounters:  03/21/15 117.028 kg (258 lb)  08/06/13 106.142 kg (234 lb)  08/03/13 106.142 kg (234 lb)    General:  Appears to be in acute respiratory distress with increased work of breathing at rest. Thankfully, there is no peripheral or central cyanosis. However, she is only able to talk in 2-3 words at a time. Eyes: PERRL, normal lids, irises & conjunctiva ENT: grossly normal hearing, lips & tongue Neck: no LAD, masses or thyromegaly Cardiovascular: RRR, no m/r/g. No LE edema. No clinical evidence of heart failure. Telemetry: SR, no arrhythmias  Respiratory: Bilateral wheezing, tight. There are no crackles or bronchial breathing. Abdomen: soft, ntnd Skin: no rash or induration seen on limited exam Musculoskeletal: grossly normal tone BUE/BLE Psychiatric: grossly normal mood and affect, speech fluent and appropriate Neurologic: grossly non-focal.          Labs on Admission:  Basic Metabolic Panel:  Recent Labs Lab 03/21/15 1030  NA 137  K 3.7  CL 100  CO2 24  GLUCOSE 174*  BUN 21  CREATININE 1.37*  CALCIUM 9.0   Liver Function Tests: No results for input(s): AST, ALT, ALKPHOS, BILITOT, PROT, ALBUMIN in the last 168 hours. No results for input(s): LIPASE, AMYLASE in the last 168 hours. No results for input(s): AMMONIA in the last 168 hours. CBC:  Recent Labs Lab 03/21/15 1030  WBC 7.6  NEUTROABS 6.7  HGB 16.5*  HCT 50.1*  MCV 87.9  PLT 150   Cardiac Enzymes:  Recent Labs Lab 03/21/15 1030  TROPONINI 0.11*    BNP (last 3 results)  Recent Labs  03/21/15 1030  BNP 88.0    ProBNP (last 3 results) No results for input(s): PROBNP in the last 8760 hours.  CBG: No results for input(s): GLUCAP in the last 168 hours.  Radiological Exams on Admission: Ct Angio Chest Pe W/cm &/or Wo Cm  03/21/2015   CLINICAL  DATA:  54 year old female with shortness of breath cough and hypoxia since yesterday. Initial encounter. Current history of smoking. Personal history of DVT.  EXAM: CT ANGIOGRAPHY CHEST WITH CONTRAST  TECHNIQUE: Multidetector CT imaging of the chest was performed using the standard protocol during bolus administration of intravenous contrast. Multiplanar CT image reconstructions and MIPs were obtained to evaluate the vascular anatomy.  CONTRAST:  159mL OMNIPAQUE IOHEXOL 350 MG/ML SOLN  COMPARISON:  Chest radiographs 03/21/2015 and earlier. Chest CT 07/04/2013.  FINDINGS: Adequate contrast bolus timing in the pulmonary arterial tree. Respiratory motion artifact. The right pulmonary artery lower lobe and middle lobe branches appear diminutive and nonenhancing and most likely are thrombosed (compare series 10, image 45 today to series 2, image 32 in 2014). Allowing for motion artifact, other bilateral pulmonary artery branches appear to remain patent. There is mild  central pulmonary artery enlargement.  Atelectatic changes to the major airways. Dependent atelectasis as well as bilateral lower lobe scarring suspected. Upper lobe centrilobular emphysema.  Cardiomegaly. No pericardial or pleural effusion. There is contrast reflux into the hepatic IVC and hepatic veins. Chronic right peritracheal lymph node enlargement appears stable since 2014. Thyromegaly. Tortuous proximal great vessels. Calcified atherosclerosis of the aorta.  No axillary lymphadenopathy. Negative visualized upper abdominal viscera.  No acute osseous abnormality identified. Degenerative changes in the spine.  Review of the MIP images confirms the above findings.  IMPRESSION: 1. No evidence of acute pulmonary embolus. 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 2. Lower lobe scarring.  Upper lobe emphysema. 3. Cardiomegaly.   Electronically Signed   By: Genevie Ann M.D.   On: 03/21/2015 14:53   Dg Chest Port 1  View  03/21/2015   CLINICAL DATA:  SOB X COUPLE DAYS, ASTHMA, EMPHYSEMA, HTN, SMOKER 1 PK/DAY  EXAM: PORTABLE CHEST - 1 VIEW  COMPARISON:  07/20/2013  FINDINGS: Increase in central pulmonary vascular congestion. No overt interstitial edema or confluent airspace disease. Stable cardiomegaly. Ectatic thoracic aorta. No effusion.  No pneumothorax. Spurring in the mid thoracic spine stable.  IMPRESSION: 1. Progressive pulmonary vascular congestion without focal infiltrate.   Electronically Signed   By: Lucrezia Europe M.D.   On: 03/21/2015 10:53    EKG: Independently reviewed. Normal sinus rhythm with right bundle branch block and left anterior fascicular block. No acute ST-T wave changes.  Assessment/Plan   1. COPD exacerbation. She will be treated with intravenous steroids, bronchodilators and empirical antibiotics. We will check influenza test. Since she is significantly hypoxemic, we will admit her to the stepdown unit. Thankfully she does not have CO2 retention on her ABG at the present time. Check ABG tomorrow morning. She may need Pulmonary consultation if her condition deteriorates. 2. Acute respiratory failure. This is from her COPD. Monitor closely. 3. Acute renal failure. This is likely secondary to poor by mouth intake/dehydration. We will give her intravenous fluids, fairly gently and monitor renal function closely. 4. Ongoing tobacco abuse. 5. Hypertension. Stable. 6. Diabetes. Continue with home medications and sliding scale.  Further recommendations will depend on patient's hospital progress.  Code Status: Full code.   DVT Prophylaxis: On warfarin.  Family Communication: I discussed the plan with the patient at the bedside.  Disposition Plan: Home when medically stable.   Time spent: 60 minutes.  Doree Albee [facility] Pager [phone].

## 2015-03-21 NOTE — ED Notes (Signed)
Pt reports that she is unable to go in the CT machine as she is claustrophobic and refuses.

## 2015-03-21 NOTE — ED Notes (Signed)
Pt returned from CT with more labored breathing at o2 sats in high 80s.  Dr Verner Chol notified and assessed pt.

## 2015-03-21 NOTE — Progress Notes (Signed)
ANTICOAGULATION CONSULT NOTE - Initial Consult  Pharmacy Consult for Coumadin (chronic Rx PTA) Indication: DVT  No Known Allergies  Patient Measurements: Height: _0  (170.2 cm) Weight: 249 lb 5.4 oz (113.1 kg) IBW/kg (Calculated) : 61.6  Vital Signs: Temp: 98.9 F (37.2 C) (03/22 1627) Temp Source: Oral (03/22 1627) BP: 143/84 mmHg (03/22 1627) Pulse Rate: 97 (03/22 1627)  Labs:  Recent Labs  03/21/15 1030  HGB 16.5*  HCT 50.1*  PLT 150  LABPROT 20.9*  INR 1.78*  CREATININE 1.37*  TROPONINI 0.11*   Estimated Creatinine Clearance: 60.9 mL/min (by C-G formula based on Cr of 1.37).  Medical History: Past Medical History  Diagnosis Date  . Hypertension   . Myocardial infarct, old   . Tobacco abuse   . Crack cocaine use   . Acute ischemic stroke 07/03/2012  . Hemiparesis, acute 07/04/2012  . Thrombocytopenia 07/03/2012  . LVH (left ventricular hypertrophy) 07/04/2012    Ejection fraction 60%.  . Diabetes   . Diabetes mellitus   . DVT (deep venous thrombosis)    Medications:  Prescriptions prior to admission  Medication Sig Dispense Refill Last Dose  . albuterol (PROVENTIL HFA;VENTOLIN HFA) 108 (90 BASE) MCG/ACT inhaler Inhale 2 puffs into the lungs every 4 (four) hours as needed for wheezing or shortness of breath. 1 Inhaler 0 03/21/2015 at Unknown time  . aspirin EC 81 MG tablet Take 81 mg by mouth daily.   03/21/2015 at Unknown time  . atorvastatin (LIPITOR) 10 MG tablet Take 10 mg by mouth at bedtime.   03/21/2015 at Unknown time  . gabapentin (NEURONTIN) 300 MG capsule Take 300 mg by mouth 3 (three) times daily.   03/21/2015 at Unknown time  . HYDROcodone-acetaminophen (NORCO) 7.5-325 MG per tablet Take 1 tablet by mouth every 4 (four) hours as needed for moderate pain.   03/21/2015 at Unknown time  . lisinopril-hydrochlorothiazide (PRINZIDE,ZESTORETIC) 20-12.5 MG per tablet Take 2 tablets by mouth daily.   03/21/2015 at Unknown time  . metFORMIN (GLUCOPHAGE-XR) 500 MG  24 hr tablet Take 500 mg by mouth daily.   03/21/2015 at Unknown time  . metoprolol tartrate (LOPRESSOR) 25 MG tablet Take 12.5 mg by mouth 2 (two) times daily.   03/21/2015 at  0900  . traZODone (DESYREL) 50 MG tablet Take 50 mg by mouth at bedtime as needed for sleep.   03/20/2015 at Unknown time  . warfarin (COUMADIN) 5 MG tablet Take 1 tablet (5 mg total) by mouth daily. 30 tablet 0 03/21/2015 at Unknown time  . enoxaparin (LOVENOX) 100 MG/ML injection Inject 1 mL (100 mg total) into the skin every 12 (twelve) hours. (Patient not taking: Reported on 03/21/2015) 10 Syringe 1   . enoxaparin (LOVENOX) KIT 1 kit by Does not apply route once. (Patient not taking: Reported on 03/21/2015) 1 kit 0   . HYDROcodone-acetaminophen (NORCO/VICODIN) 5-325 MG per tablet Take 1-2 tablets by mouth every 6 (six) hours as needed for pain. (Patient not taking: Reported on 03/21/2015) 20 tablet 0    Assessment: 54yo female was on chronic Coumadin PTA for h/o DVT (not acute).  Pt is no longer on Lovenox (D/W Dr Anastasio Champion and will not restart at this point).  INR is below goal.  Home Coumadin dose is reportedly 90m daily.    Goal of Therapy:  INR 2-3 Monitor platelets by anticoagulation protocol: Yes   Plan:  Coumadin 7.563mpo today x1 to boost INR INR daily  HaHart Robinsons 03/21/2015,4:30 PM

## 2015-03-21 NOTE — Progress Notes (Signed)
eLink Physician-Brief Progress Note Patient Name: Tracy [name] DOB: 06-27-1961 MRN: [id number]   Date of Service  03/21/2015  HPI/Events of Note  cbecame unsteady upright  eICU Interventions  Foley needed     Intervention Category Minor Interventions: Routine modifications to care plan (e.g. PRN medications for pain, fever)  Raylene Miyamoto. 03/21/2015, 7:35 PM

## 2015-03-22 DIAGNOSIS — J111 Influenza due to unidentified influenza virus with other respiratory manifestations: Secondary | ICD-10-CM | POA: Diagnosis present

## 2015-03-22 DIAGNOSIS — J1189 Influenza due to unidentified influenza virus with other manifestations: Secondary | ICD-10-CM

## 2015-03-22 LAB — GLUCOSE, CAPILLARY
GLUCOSE-CAPILLARY: 175 mg/dL — AB (ref 70–99)
Glucose-Capillary: 193 mg/dL — ABNORMAL HIGH (ref 70–99)
Glucose-Capillary: 217 mg/dL — ABNORMAL HIGH (ref 70–99)

## 2015-03-22 LAB — COMPREHENSIVE METABOLIC PANEL
ALBUMIN: 3 g/dL — AB (ref 3.5–5.2)
ALK PHOS: 63 U/L (ref 39–117)
ALT: 39 U/L — AB (ref 0–35)
AST: 54 U/L — AB (ref 0–37)
Anion gap: 11 (ref 5–15)
BILIRUBIN TOTAL: 0.5 mg/dL (ref 0.3–1.2)
BUN: 23 mg/dL (ref 6–23)
CO2: 24 mmol/L (ref 19–32)
Calcium: 8.3 mg/dL — ABNORMAL LOW (ref 8.4–10.5)
Chloride: 103 mmol/L (ref 96–112)
Creatinine, Ser: 1.16 mg/dL — ABNORMAL HIGH (ref 0.50–1.10)
GFR calc Af Amer: 61 mL/min — ABNORMAL LOW (ref >90)
GFR calc non Af Amer: 52 mL/min — ABNORMAL LOW (ref >90)
Glucose, Bld: 188 mg/dL — ABNORMAL HIGH (ref 70–99)
POTASSIUM: 4.2 mmol/L (ref 3.5–5.1)
SODIUM: 138 mmol/L (ref 135–145)
Total Protein: 6.2 g/dL (ref 6.0–8.3)

## 2015-03-22 LAB — CG4 I-STAT (LACTIC ACID): LACTIC ACID, VENOUS: 1.94 mmol/L (ref 0.5–2.0)

## 2015-03-22 LAB — CBC
HEMATOCRIT: 47.4 % — AB (ref 36.0–46.0)
Hemoglobin: 15.6 g/dL — ABNORMAL HIGH (ref 12.0–15.0)
MCH: 29.7 pg (ref 26.0–34.0)
MCHC: 32.9 g/dL (ref 30.0–36.0)
MCV: 90.3 fL (ref 78.0–100.0)
Platelets: 118 10*3/uL — ABNORMAL LOW (ref 150–400)
RBC: 5.25 MIL/uL — ABNORMAL HIGH (ref 3.87–5.11)
RDW: 14.3 % (ref 11.5–15.5)
WBC: 6.8 10*3/uL (ref 4.0–10.5)

## 2015-03-22 LAB — PROTIME-INR
INR: 1.94 — ABNORMAL HIGH (ref 0.00–1.49)
PROTHROMBIN TIME: 22.3 s — AB (ref 11.6–15.2)

## 2015-03-22 MED ORDER — OSELTAMIVIR PHOSPHATE 75 MG PO CAPS
75.0000 mg | ORAL_CAPSULE | Freq: Two times a day (BID) | ORAL | Status: DC
  Administered 2015-03-22 – 2015-03-25 (×7): 75 mg via ORAL
  Filled 2015-03-22 (×7): qty 1

## 2015-03-22 MED ORDER — WARFARIN SODIUM 5 MG PO TABS
5.0000 mg | ORAL_TABLET | Freq: Once | ORAL | Status: AC
  Administered 2015-03-22: 5 mg via ORAL
  Filled 2015-03-22: qty 1

## 2015-03-22 NOTE — Plan of Care (Signed)
Problem: Phase I Progression Outcomes Goal: Pt OOB to Walk or Exercise Daily With Nursing or PT Patient OOB to walk or exercise daily with nursing or PT if activity order permits  Outcome: Not Progressing Unable to tolerate sitting up on side of bed. Become severely sob w/ any exertion.

## 2015-03-22 NOTE — Progress Notes (Signed)
[facility] PROGRESS NOTE  Tracy [name] [id number] DOB: 07-10-1961 DOA: 03/21/2015 PCP: MUSE,ROCHELLE D., PA-C  Assessment/Plan: 1. COPD exacerbation. Likely precipitated by influenza. Started on intravenous steroids, bronchodilators and antibiotics. Clinically, she appears to be mildly improving today. Continue current treatments. We'll plan on de-escalate steroids tomorrow if she continues to improve. 2. Acute respiratory failure with hypoxia. Related COPD. Wean off oxygen as tolerated. 3. Influenza with respiratory manifestations. Start on Tamiflu as well as droplet precautions. 4. Acute renal failure related to dehydration. Improved with IV fluids. 5. Hypertension. Stable 6. Diabetes. Continue home regimen as well as sliding scale insulin. Follow CBGs. 7. Ongoing tobacco use. 8. History of the VTE. Continue on Coumadin per pharmacy 9. Transient atrial fibrillation. Patient developed transient atrial fibrillation overnight which quickly resolved with Cardizem infusion. She is currently in sinus rhythm. This was likely driven by her underlying pulmonary issues. Discussed with cardiology and no further workup recommended.  Code Status: full code Family Communication: discussed with patient Disposition Plan: discharge home once improved   Consultants:    Procedures:    Antibiotics:  levaquin 3/22>>  HPI/Subjective: Feels breathing may be a little better today. Has cough which is non productive. No chest pain  Objective: Filed Vitals:   03/22/15 0758  BP:   Pulse:   Temp: 97.7 F (36.5 C)  Resp:     Intake/Output Summary (Last 24 hours) at 03/22/15 0959 Last data filed at 03/22/15 0758  Gross per 24 hour  Intake  989.8 ml  Output   1450 ml  Net -460.2 ml   Filed Weights   03/21/15 1013 03/21/15 1627 03/22/15 0400  Weight: 117.028 kg (258 lb) 113.1 kg (249 lb 5.4 oz) 113.8 kg (250 lb 14.1 oz)    Exam:   General:  Increased work of breathing,  sitting up in bed  Cardiovascular: s1, s2, rrr  Respiratory: diminished breath sounds bilaterally  Abdomen: soft, nt, nd, bs_  Musculoskeletal: no edema b/l   Data Reviewed: Basic Metabolic Panel:  Recent Labs Lab 03/21/15 1030 03/22/15 0440  NA 137 138  K 3.7 4.2  CL 100 103  CO2 24 24  GLUCOSE 174* 188*  BUN 21 23  CREATININE 1.37* 1.16*  CALCIUM 9.0 8.3*   Liver Function Tests:  Recent Labs Lab 03/22/15 0440  AST 54*  ALT 39*  ALKPHOS 63  BILITOT 0.5  PROT 6.2  ALBUMIN 3.0*   No results for input(s): LIPASE, AMYLASE in the last 168 hours. No results for input(s): AMMONIA in the last 168 hours. CBC:  Recent Labs Lab 03/21/15 1030 03/22/15 0440  WBC 7.6 6.8  NEUTROABS 6.7  --   HGB 16.5* 15.6*  HCT 50.1* 47.4*  MCV 87.9 90.3  PLT 150 118*   Cardiac Enzymes:  Recent Labs Lab 03/21/15 1030  TROPONINI 0.11*   BNP (last 3 results)  Recent Labs  03/21/15 1030  BNP 88.0    ProBNP (last 3 results) No results for input(s): PROBNP in the last 8760 hours.  CBG:  Recent Labs Lab 03/21/15 1647 03/21/15 2123 03/22/15 0750  GLUCAP 184* 193* 175*    Recent Results (from the past 240 hour(s))  Blood culture (routine x 2)     Status: None (Preliminary result)   Collection Time: 03/21/15 11:33 AM  Result Value Ref Range Status   Specimen Description BLOOD RIGHT ANTECUBITAL  Final   Special Requests BOTTLES DRAWN AEROBIC AND ANAEROBIC 5CC  Final   Culture NO GROWTH <24 HRS  Final   Report Status PENDING  Incomplete  Blood culture (routine x 2)     Status: None (Preliminary result)   Collection Time: 03/21/15 12:17 PM  Result Value Ref Range Status   Specimen Description BLOOD RIGHT ARM  Final   Special Requests BOTTLES DRAWN AEROBIC AND ANAEROBIC [place]  Final   Culture NO GROWTH <24 HRS  Final   Report Status PENDING  Incomplete  MRSA PCR Screening     Status: None   Collection Time: 03/21/15  4:25 PM  Result Value Ref Range Status   MRSA  by PCR NEGATIVE NEGATIVE Final    Comment:        The GeneXpert MRSA Assay (FDA approved for NASAL specimens only), is one component of a comprehensive MRSA colonization surveillance program. It is not intended to diagnose MRSA infection nor to guide or monitor treatment for MRSA infections.      Studies: Ct Angio Chest Pe W/cm &/or Wo Cm  03/21/2015   CLINICAL DATA:  54 year old female with shortness of breath cough and hypoxia since yesterday. Initial encounter. Current history of smoking. Personal history of DVT.  EXAM: CT ANGIOGRAPHY CHEST WITH CONTRAST  TECHNIQUE: Multidetector CT imaging of the chest was performed using the standard protocol during bolus administration of intravenous contrast. Multiplanar CT image reconstructions and MIPs were obtained to evaluate the vascular anatomy.  CONTRAST:  173mL OMNIPAQUE IOHEXOL 350 MG/ML SOLN  COMPARISON:  Chest radiographs 03/21/2015 and earlier. Chest CT 07/04/2013.  FINDINGS: Adequate contrast bolus timing in the pulmonary arterial tree. Respiratory motion artifact. The right pulmonary artery lower lobe and middle lobe branches appear diminutive and nonenhancing and most likely are thrombosed (compare series 10, image 45 today to series 2, image 32 in 2014). Allowing for motion artifact, other bilateral pulmonary artery branches appear to remain patent. There is mild central pulmonary artery enlargement.  Atelectatic changes to the major airways. Dependent atelectasis as well as bilateral lower lobe scarring suspected. Upper lobe centrilobular emphysema.  Cardiomegaly. No pericardial or pleural effusion. There is contrast reflux into the hepatic IVC and hepatic veins. Chronic right peritracheal lymph node enlargement appears stable since 2014. Thyromegaly. Tortuous proximal great vessels. Calcified atherosclerosis of the aorta.  No axillary lymphadenopathy. Negative visualized upper abdominal viscera.  No acute osseous abnormality identified.  Degenerative changes in the spine.  Review of the MIP images confirms the above findings.  IMPRESSION: 1. No evidence of acute pulmonary embolus. 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 2. Lower lobe scarring.  Upper lobe emphysema. 3. Cardiomegaly.   Electronically Signed   By: Genevie Ann M.D.   On: 03/21/2015 14:53   Dg Chest Port 1 View  03/21/2015   CLINICAL DATA:  SOB X COUPLE DAYS, ASTHMA, EMPHYSEMA, HTN, SMOKER 1 PK/DAY  EXAM: PORTABLE CHEST - 1 VIEW  COMPARISON:  07/20/2013  FINDINGS: Increase in central pulmonary vascular congestion. No overt interstitial edema or confluent airspace disease. Stable cardiomegaly. Ectatic thoracic aorta. No effusion.  No pneumothorax. Spurring in the mid thoracic spine stable.  IMPRESSION: 1. Progressive pulmonary vascular congestion without focal infiltrate.   Electronically Signed   By: Lucrezia Europe M.D.   On: 03/21/2015 10:53    Scheduled Meds: . amLODipine  10 mg Oral Daily  . aspirin EC  81 mg Oral Daily  . atorvastatin  10 mg Oral QHS  . gabapentin  300 mg Oral TID  . lisinopril  20 mg Oral Daily   And  .  hydrochlorothiazide  12.5 mg Oral Daily  . insulin aspart  0-20 Units Subcutaneous TID WC  . insulin aspart  0-5 Units Subcutaneous QHS  . levalbuterol  0.63 mg Nebulization Q6H  . levofloxacin (LEVAQUIN) IV  500 mg Intravenous Q24H  . [START ON 03/24/2015] metFORMIN  500 mg Oral QPC breakfast  . methylPREDNISolone (SOLU-MEDROL) injection  125 mg Intravenous Q6H  . metoprolol tartrate  12.5 mg Oral BID  . nicotine  21 mg Transdermal Daily  . oseltamivir  75 mg Oral BID  . sodium chloride  3 mL Intravenous Q12H  . warfarin  5 mg Oral Once  . Warfarin - Pharmacist Dosing Inpatient   Does not apply Q24H   Continuous Infusions: . sodium chloride 75 mL/hr at 03/22/15 0700  . diltiazem (CARDIZEM) infusion Stopped (03/22/15 [id number])    Active Problems:   Tobacco abuse   Essential hypertension    Diabetes   Acute respiratory failure   COPD exacerbation    Time spent: 10mins    Akshita Italiano  [facility] Pager [phone]. If 7PM-7AM, please contact night-coverage at [email or web address], password [facility] 03/22/2015, 9:59 AM  LOS: 1 day

## 2015-03-22 NOTE — Progress Notes (Addendum)
[place] for Coumadin Indication: DVT  No Known Allergies  Patient Measurements: Height: 5\' 7"  (170.2 cm) Weight: 250 lb 14.1 oz (113.8 kg) IBW/kg (Calculated) : 61.6  Vital Signs: Temp: 97.7 F (36.5 C) (03/23 0758) Temp Source: Oral (03/23 0758) BP: 150/89 mmHg (03/23 0700) Pulse Rate: 72 (03/23 0700)  Labs:  Recent Labs  03/21/15 1030 03/22/15 0440  HGB 16.5* 15.6*  HCT 50.1* 47.4*  PLT 150 118*  LABPROT 20.9* 22.3*  INR 1.78* 1.94*  CREATININE 1.37* 1.16*  TROPONINI 0.11*  --    Estimated Creatinine Clearance: 72.2 mL/min (by C-G formula based on Cr of 1.16).  Medical History: Past Medical History  Diagnosis Date  . Hypertension   . Myocardial infarct, old   . Tobacco abuse   . Crack cocaine use   . Acute ischemic stroke 07/03/2012  . Hemiparesis, acute 07/04/2012  . Thrombocytopenia 07/03/2012  . LVH (left ventricular hypertrophy) 07/04/2012    Ejection fraction 60%.  . Diabetes   . Diabetes mellitus   . DVT (deep venous thrombosis)    Medications:  Prescriptions prior to admission  Medication Sig Dispense Refill Last Dose  . albuterol (PROVENTIL HFA;VENTOLIN HFA) 108 (90 BASE) MCG/ACT inhaler Inhale 2 puffs into the lungs every 4 (four) hours as needed for wheezing or shortness of breath. 1 Inhaler 0 03/21/2015 at Unknown time  . aspirin EC 81 MG tablet Take 81 mg by mouth daily.   03/21/2015 at Unknown time  . atorvastatin (LIPITOR) 10 MG tablet Take 10 mg by mouth at bedtime.   03/21/2015 at Unknown time  . gabapentin (NEURONTIN) 300 MG capsule Take 300 mg by mouth 3 (three) times daily.   03/21/2015 at Unknown time  . HYDROcodone-acetaminophen (NORCO) 7.5-325 MG per tablet Take 1 tablet by mouth every 4 (four) hours as needed for moderate pain.   03/21/2015 at Unknown time  . lisinopril-hydrochlorothiazide (PRINZIDE,ZESTORETIC) 20-12.5 MG per tablet Take 2 tablets by mouth daily.   03/21/2015 at Unknown time  . metFORMIN  (GLUCOPHAGE-XR) 500 MG 24 hr tablet Take 500 mg by mouth daily.   03/21/2015 at Unknown time  . metoprolol tartrate (LOPRESSOR) 25 MG tablet Take 12.5 mg by mouth 2 (two) times daily.   03/21/2015 at  0900  . traZODone (DESYREL) 50 MG tablet Take 50 mg by mouth at bedtime as needed for sleep.   03/20/2015 at Unknown time  . warfarin (COUMADIN) 5 MG tablet Take 1 tablet (5 mg total) by mouth daily. 30 tablet 0 03/21/2015 at Unknown time  . enoxaparin (LOVENOX) 100 MG/ML injection Inject 1 mL (100 mg total) into the skin every 12 (twelve) hours. (Patient not taking: Reported on 03/21/2015) 10 Syringe 1   . enoxaparin (LOVENOX) KIT 1 kit by Does not apply route once. (Patient not taking: Reported on 03/21/2015) 1 kit 0   . HYDROcodone-acetaminophen (NORCO/VICODIN) 5-325 MG per tablet Take 1-2 tablets by mouth every 6 (six) hours as needed for pain. (Patient not taking: Reported on 03/21/2015) 20 tablet 0    Assessment: 54yo F on chronic Coumadin PTA for h/o DVT.  Home dose listed above.  INR was below goal on admission, but trending up to goal range after increased dose yesterday.  Patient is also on antibiotics which could interact with Coumadin to increase INR. No bleeding noted.   Goal of Therapy:  INR 2-3   Plan:  Coumadin 5mg  po today x1  INR daily  Biagio Borg 03/22/2015,8:40 AM

## 2015-03-22 NOTE — Care Management Utilization Note (Signed)
UR completed 

## 2015-03-23 LAB — GLUCOSE, CAPILLARY
GLUCOSE-CAPILLARY: 169 mg/dL — AB (ref 70–99)
Glucose-Capillary: 210 mg/dL — ABNORMAL HIGH (ref 70–99)
Glucose-Capillary: 194 mg/dL — ABNORMAL HIGH (ref 70–99)
Glucose-Capillary: 239 mg/dL — ABNORMAL HIGH (ref 70–99)
Glucose-Capillary: 232 mg/dL — ABNORMAL HIGH (ref 70–99)
Glucose-Capillary: 223 mg/dL — ABNORMAL HIGH (ref 70–99)

## 2015-03-23 LAB — BASIC METABOLIC PANEL WITH GFR
Anion gap: 11 (ref 5–15)
BUN: 26 mg/dL — ABNORMAL HIGH (ref 6–23)
CO2: 22 mmol/L (ref 19–32)
Calcium: 8.4 mg/dL (ref 8.4–10.5)
Chloride: 104 mmol/L (ref 96–112)
Creatinine, Ser: 1.09 mg/dL (ref 0.50–1.10)
GFR calc Af Amer: 65 mL/min — ABNORMAL LOW
GFR calc non Af Amer: 56 mL/min — ABNORMAL LOW
Glucose, Bld: 271 mg/dL — ABNORMAL HIGH (ref 70–99)
Potassium: 3.7 mmol/L (ref 3.5–5.1)
Sodium: 137 mmol/L (ref 135–145)

## 2015-03-23 LAB — CBC
HEMATOCRIT: 46.9 % — AB (ref 36.0–46.0)
HEMOGLOBIN: 15.3 g/dL — AB (ref 12.0–15.0)
MCH: 29.4 pg (ref 26.0–34.0)
MCHC: 32.6 g/dL (ref 30.0–36.0)
MCV: 90 fL (ref 78.0–100.0)
Platelets: 139 10*3/uL — ABNORMAL LOW (ref 150–400)
RBC: 5.21 MIL/uL — AB (ref 3.87–5.11)
RDW: 14.3 % (ref 11.5–15.5)
WBC: 8.7 10*3/uL (ref 4.0–10.5)

## 2015-03-23 LAB — PROTIME-INR
INR: 2.2 — ABNORMAL HIGH (ref 0.00–1.49)
Prothrombin Time: 24.7 s — ABNORMAL HIGH (ref 11.6–15.2)

## 2015-03-23 MED ORDER — LEVALBUTEROL HCL 0.63 MG/3ML IN NEBU
0.6300 mg | INHALATION_SOLUTION | RESPIRATORY_TRACT | Status: DC
  Administered 2015-03-23 – 2015-03-25 (×12): 0.63 mg via RESPIRATORY_TRACT
  Filled 2015-03-23 (×12): qty 3

## 2015-03-23 MED ORDER — IPRATROPIUM BROMIDE 0.02 % IN SOLN
0.5000 mg | RESPIRATORY_TRACT | Status: DC
  Administered 2015-03-23 – 2015-03-25 (×12): 0.5 mg via RESPIRATORY_TRACT
  Filled 2015-03-23 (×12): qty 2.5

## 2015-03-23 MED ORDER — WARFARIN SODIUM 5 MG PO TABS
5.0000 mg | ORAL_TABLET | Freq: Once | ORAL | Status: AC
  Administered 2015-03-23: 5 mg via ORAL
  Filled 2015-03-23: qty 1

## 2015-03-23 MED ORDER — METHYLPREDNISOLONE SODIUM SUCC 125 MG IJ SOLR
60.0000 mg | Freq: Four times a day (QID) | INTRAMUSCULAR | Status: DC
  Administered 2015-03-23 – 2015-03-25 (×9): 60 mg via INTRAVENOUS
  Filled 2015-03-23 (×8): qty 2

## 2015-03-23 MED ORDER — GUAIFENESIN ER 600 MG PO TB12
1200.0000 mg | ORAL_TABLET | Freq: Two times a day (BID) | ORAL | Status: DC
  Administered 2015-03-23 – 2015-03-25 (×5): 1200 mg via ORAL
  Filled 2015-03-23 (×5): qty 2

## 2015-03-23 NOTE — Progress Notes (Addendum)
Patient's resting O2 sats on RA 92%.  While ambulating patient's O2 sats dropped to 86%.  Patien refused to be placed on any O2 once back to room.  Will continue to monitor patient.

## 2015-03-23 NOTE — Progress Notes (Signed)
ANTICOAGULATION CONSULT NOTE  Pharmacy Consult for Coumadin Indication: DVT  No Known Allergies  Patient Measurements: Height: 5\' 7"  (170.2 cm) Weight: 257 lb 0.9 oz (116.6 kg) IBW/kg (Calculated) : 61.6  Vital Signs: Temp: 97.4 F (36.3 C) (03/24 0800) Temp Source: Oral (03/24 0800) BP: 135/90 mmHg (03/24 0600) Pulse Rate: 55 (03/24 0600)  Labs:  Recent Labs  03/21/15 1030 03/22/15 0440 03/23/15 0436  HGB 16.5* 15.6* 15.3*  HCT 50.1* 47.4* 46.9*  PLT 150 118* 139*  LABPROT 20.9* 22.3* 24.7*  INR 1.78* 1.94* 2.20*  CREATININE 1.37* 1.16* 1.09  TROPONINI 0.11*  --   --    Estimated Creatinine Clearance: 77.9 mL/min (by C-G formula based on Cr of 1.09).  Medical History: Past Medical History  Diagnosis Date  . Hypertension   . Myocardial infarct, old   . Tobacco abuse   . Crack cocaine use   . Acute ischemic stroke 07/03/2012  . Hemiparesis, acute 07/04/2012  . Thrombocytopenia 07/03/2012  . LVH (left ventricular hypertrophy) 07/04/2012    Ejection fraction 60%.  . Diabetes   . Diabetes mellitus   . DVT (deep venous thrombosis)    Medications:  Prescriptions prior to admission  Medication Sig Dispense Refill Last Dose  . albuterol (PROVENTIL HFA;VENTOLIN HFA) 108 (90 BASE) MCG/ACT inhaler Inhale 2 puffs into the lungs every 4 (four) hours as needed for wheezing or shortness of breath. 1 Inhaler 0 03/21/2015 at Unknown time  . aspirin EC 81 MG tablet Take 81 mg by mouth daily.   03/21/2015 at Unknown time  . atorvastatin (LIPITOR) 10 MG tablet Take 10 mg by mouth at bedtime.   03/21/2015 at Unknown time  . gabapentin (NEURONTIN) 300 MG capsule Take 300 mg by mouth 3 (three) times daily.   03/21/2015 at Unknown time  . HYDROcodone-acetaminophen (NORCO) 7.5-325 MG per tablet Take 1 tablet by mouth every 4 (four) hours as needed for moderate pain.   03/21/2015 at Unknown time  . lisinopril-hydrochlorothiazide (PRINZIDE,ZESTORETIC) 20-12.5 MG per tablet Take 2 tablets by mouth  daily.   03/21/2015 at Unknown time  . metFORMIN (GLUCOPHAGE-XR) 500 MG 24 hr tablet Take 500 mg by mouth daily.   03/21/2015 at Unknown time  . metoprolol tartrate (LOPRESSOR) 25 MG tablet Take 12.5 mg by mouth 2 (two) times daily.   03/21/2015 at  0900  . traZODone (DESYREL) 50 MG tablet Take 50 mg by mouth at bedtime as needed for sleep.   03/20/2015 at Unknown time  . warfarin (COUMADIN) 5 MG tablet Take 1 tablet (5 mg total) by mouth daily. 30 tablet 0 03/21/2015 at Unknown time  . enoxaparin (LOVENOX) 100 MG/ML injection Inject 1 mL (100 mg total) into the skin every 12 (twelve) hours. (Patient not taking: Reported on 03/21/2015) 10 Syringe 1   . enoxaparin (LOVENOX) KIT 1 kit by Does not apply route once. (Patient not taking: Reported on 03/21/2015) 1 kit 0   . HYDROcodone-acetaminophen (NORCO/VICODIN) 5-325 MG per tablet Take 1-2 tablets by mouth every 6 (six) hours as needed for pain. (Patient not taking: Reported on 03/21/2015) 20 tablet 0    Assessment: 54yo F on chronic Coumadin PTA for h/o DVT.  Home dose listed above.  INR was below goal on admission, but within goal range today after increased dose on 3/22 .   Patient is also on Levaquin which could interact with Coumadin to increase INR. No bleeding noted.   Goal of Therapy:  INR 2-3   Plan:  Coumadin  $'5mg'k$  po today x1  INR daily  Biagio Borg 03/23/2015,8:47 AM

## 2015-03-23 NOTE — Progress Notes (Signed)
Called report to Tedd Sias, RN on dept 300.  Verbalized understanding.  Pt transferred to room 331 in safe and stable condition. [name], Eulis Canner 03/23/2015

## 2015-03-23 NOTE — Care Management Note (Addendum)
    Page 1 of 2   03/24/2015     3:23:26 PM CARE MANAGEMENT NOTE 03/24/2015  Patient:  IEESHA, BOSTOCK   Account Number:  [id number]  Date Initiated:  03/23/2015  Documentation initiated by:  Jolene Provost  Subjective/Objective Assessment:   Pt is from home, lives alone. Pt has walker for PRN use. Pt has no other DME's or [facility] services prior to admission. Pt needs neb machine and home O2 assessment prior to discharge.     Action/Plan:   Pt ordered neb machine. Pt chooses [facility] for DME. [employer] called and faxed pt information. Neb machine will be delivered to pt's home. Will cont to follow.   Anticipated DC Date:  03/25/2015   Anticipated DC Plan:  HOME/SELF CARE      DC Planning Services  CM consult      PAC Choice  DURABLE MEDICAL EQUIPMENT   Choice offered to / List presented to:  C-1 Patient   DME arranged  NEBULIZER MACHINE      DME agency  [facility]        Status of service:  Completed, signed off Medicare Important Message given?  YES (If response is "NO", the following Medicare IM given date fields will be blank) Date Medicare IM given:  03/24/2015 Medicare IM given by:  Theophilus Kinds Date Additional Medicare IM given:   Additional Medicare IM given by:    Discharge Disposition:  HOME/SELF CARE  Per UR Regulation:  Reviewed for med. necessity/level of care/duration of stay  If discussed at [place] of Stay Meetings, dates discussed:    Comments:  03/24/15 [place], RN BSN CM Pt qualifies for home O2. Pt requiring 4 liters. MD is concerned pt not stable for discharge so pt is going to stay another day. Weekend staff will have to reassess O2 sats prior to discharge and arrange home O2 with [facility] (agency that pt chooses). Numbers to call and fax orders and sats to will be on sticky note on chart.  03/23/2015 [place], RN, MSN, CM

## 2015-03-23 NOTE — Progress Notes (Signed)
[facility] PROGRESS NOTE  Tracy [name] [id number] DOB: 08-22-1961 DOA: 03/21/2015 PCP: MUSE,ROCHELLE D., PA-C  Assessment/Plan: 1. COPD exacerbation. Likely precipitated by influenza. On intravenous steroids, bronchodilators and antibiotics. Clinically, she appears to be mildly improving today, although she is still tachypneic. Continue current treatments. Continue IV steroids, but will lower the dose to 60mg  q6h. 2. Acute respiratory failure with hypoxia. Related COPD. Wean off oxygen as tolerated. 3. Influenza with respiratory manifestations. On Tamiflu as well as droplet precautions. 4. Acute renal failure related to dehydration. Improved with IV fluids. 5. Hypertension. Stable 6. Diabetes. Continue home regimen as well as sliding scale insulin. Follow CBGs. 7. Ongoing tobacco use. 8. History of the VTE. Continue on Coumadin per pharmacy 9. Transient atrial fibrillation. Patient developed transient atrial fibrillation which quickly resolved with Cardizem infusion. She is currently in sinus rhythm. This was likely driven by her underlying pulmonary issues. Discussed with cardiology and no further workup recommended.  Code Status: full code Family Communication: discussed with patient Disposition Plan: discharge home once improved, transfer telemetry   Consultants:    Procedures:    Antibiotics:  levaquin 3/22>>  HPI/Subjective: Feeling better today. Wants to go home.  Objective: Filed Vitals:   03/23/15 0800  BP:   Pulse:   Temp: 97.4 F (36.3 C)  Resp:     Intake/Output Summary (Last 24 hours) at 03/23/15 0943 Last data filed at 03/23/15 0500  Gross per 24 hour  Intake   1820 ml  Output   1300 ml  Net    520 ml   Filed Weights   03/21/15 1627 03/22/15 0400 03/23/15 0500  Weight: 113.1 kg (249 lb 5.4 oz) 113.8 kg (250 lb 14.1 oz) 116.6 kg (257 lb 0.9 oz)    Exam:   General:  Increased work of breathing, sitting up in  bed  Cardiovascular: s1, s2, rrr  Respiratory: diminished breath sounds bilaterally  Abdomen: soft, nt, nd, bs_  Musculoskeletal: no edema b/l   Data Reviewed: Basic Metabolic Panel:  Recent Labs Lab 03/21/15 1030 03/22/15 0440 03/23/15 0436  NA 137 138 137  K 3.7 4.2 3.7  CL 100 103 104  CO2 24 24 22   GLUCOSE 174* 188* 271*  BUN 21 23 26*  CREATININE 1.37* 1.16* 1.09  CALCIUM 9.0 8.3* 8.4   Liver Function Tests:  Recent Labs Lab 03/22/15 0440  AST 54*  ALT 39*  ALKPHOS 63  BILITOT 0.5  PROT 6.2  ALBUMIN 3.0*   No results for input(s): LIPASE, AMYLASE in the last 168 hours. No results for input(s): AMMONIA in the last 168 hours. CBC:  Recent Labs Lab 03/21/15 1030 03/22/15 0440 03/23/15 0436  WBC 7.6 6.8 8.7  NEUTROABS 6.7  --   --   HGB 16.5* 15.6* 15.3*  HCT 50.1* 47.4* 46.9*  MCV 87.9 90.3 90.0  PLT 150 118* 139*   Cardiac Enzymes:  Recent Labs Lab 03/21/15 1030  TROPONINI 0.11*   BNP (last 3 results)  Recent Labs  03/21/15 1030  BNP 88.0    ProBNP (last 3 results) No results for input(s): PROBNP in the last 8760 hours.  CBG:  Recent Labs Lab 03/22/15 0750 03/22/15 1120 03/22/15 1628 03/22/15 2138 03/23/15 0751  GLUCAP 175* 217* 210* 194* 239*    Recent Results (from the past 240 hour(s))  Blood culture (routine x 2)     Status: None (Preliminary result)   Collection Time: 03/21/15 11:40 AM  Result Value Ref Range Status   Specimen  Description BLOOD RIGHT ANTECUBITAL  Final   Special Requests BOTTLES DRAWN AEROBIC AND ANAEROBIC 5CC  Final   Culture NO GROWTH 1 DAY  Final   Report Status PENDING  Incomplete  Blood culture (routine x 2)     Status: None (Preliminary result)   Collection Time: 03/21/15 12:20 PM  Result Value Ref Range Status   Specimen Description BLOOD RIGHT ARM  Final   Special Requests BOTTLES DRAWN AEROBIC AND ANAEROBIC [place]  Final   Culture NO GROWTH 1 DAY  Final   Report Status PENDING  Incomplete   MRSA PCR Screening     Status: None   Collection Time: 03/21/15  4:25 PM  Result Value Ref Range Status   MRSA by PCR NEGATIVE NEGATIVE Final    Comment:        The GeneXpert MRSA Assay (FDA approved for NASAL specimens only), is one component of a comprehensive MRSA colonization surveillance program. It is not intended to diagnose MRSA infection nor to guide or monitor treatment for MRSA infections.      Studies: Ct Angio Chest Pe W/cm &/or Wo Cm  03/21/2015   CLINICAL DATA:  54 year old female with shortness of breath cough and hypoxia since yesterday. Initial encounter. Current history of smoking. Personal history of DVT.  EXAM: CT ANGIOGRAPHY CHEST WITH CONTRAST  TECHNIQUE: Multidetector CT imaging of the chest was performed using the standard protocol during bolus administration of intravenous contrast. Multiplanar CT image reconstructions and MIPs were obtained to evaluate the vascular anatomy.  CONTRAST:  152mL OMNIPAQUE IOHEXOL 350 MG/ML SOLN  COMPARISON:  Chest radiographs 03/21/2015 and earlier. Chest CT 07/04/2013.  FINDINGS: Adequate contrast bolus timing in the pulmonary arterial tree. Respiratory motion artifact. The right pulmonary artery lower lobe and middle lobe branches appear diminutive and nonenhancing and most likely are thrombosed (compare series 10, image 45 today to series 2, image 32 in 2014). Allowing for motion artifact, other bilateral pulmonary artery branches appear to remain patent. There is mild central pulmonary artery enlargement.  Atelectatic changes to the major airways. Dependent atelectasis as well as bilateral lower lobe scarring suspected. Upper lobe centrilobular emphysema.  Cardiomegaly. No pericardial or pleural effusion. There is contrast reflux into the hepatic IVC and hepatic veins. Chronic right peritracheal lymph node enlargement appears stable since 2014. Thyromegaly. Tortuous proximal great vessels. Calcified atherosclerosis of the aorta.  No  axillary lymphadenopathy. Negative visualized upper abdominal viscera.  No acute osseous abnormality identified. Degenerative changes in the spine.  Review of the MIP images confirms the above findings.  IMPRESSION: 1. No evidence of acute pulmonary embolus. 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 2. Lower lobe scarring.  Upper lobe emphysema. 3. Cardiomegaly.   Electronically Signed   By: Genevie Ann M.D.   On: 03/21/2015 14:53   Dg Chest Port 1 View  03/21/2015   CLINICAL DATA:  SOB X COUPLE DAYS, ASTHMA, EMPHYSEMA, HTN, SMOKER 1 PK/DAY  EXAM: PORTABLE CHEST - 1 VIEW  COMPARISON:  07/20/2013  FINDINGS: Increase in central pulmonary vascular congestion. No overt interstitial edema or confluent airspace disease. Stable cardiomegaly. Ectatic thoracic aorta. No effusion.  No pneumothorax. Spurring in the mid thoracic spine stable.  IMPRESSION: 1. Progressive pulmonary vascular congestion without focal infiltrate.   Electronically Signed   By: Lucrezia Europe M.D.   On: 03/21/2015 10:53    Scheduled Meds: . amLODipine  10 mg Oral Daily  . aspirin EC  81 mg Oral Daily  .  atorvastatin  10 mg Oral QHS  . gabapentin  300 mg Oral TID  . lisinopril  20 mg Oral Daily   And  . hydrochlorothiazide  12.5 mg Oral Daily  . insulin aspart  0-20 Units Subcutaneous TID WC  . insulin aspart  0-5 Units Subcutaneous QHS  . levalbuterol  0.63 mg Nebulization Q6H  . levofloxacin (LEVAQUIN) IV  500 mg Intravenous Q24H  . [START ON 03/24/2015] metFORMIN  500 mg Oral QPC breakfast  . methylPREDNISolone (SOLU-MEDROL) injection  60 mg Intravenous Q6H  . metoprolol tartrate  12.5 mg Oral BID  . nicotine  21 mg Transdermal Daily  . oseltamivir  75 mg Oral BID  . sodium chloride  3 mL Intravenous Q12H  . Warfarin - Pharmacist Dosing Inpatient   Does not apply Q24H   Continuous Infusions:    Active Problems:   Tobacco abuse   Essential hypertension   Diabetes   Acute respiratory  failure   COPD exacerbation   Influenza with respiratory manifestations    Time spent: 92mins    Babacar Haycraft  [facility] Pager [phone]. If 7PM-7AM, please contact night-coverage at [email or web address], password [facility] 03/23/2015, 9:43 AM  LOS: 2 days

## 2015-03-23 NOTE — Progress Notes (Signed)
Inpatient Diabetes Program Recommendations  AACE/ADA: New Consensus Statement on Inpatient Glycemic Control (2013)  Target Ranges:  Prepandial:   less than 140 mg/dL      Peak postprandial:   less than 180 mg/dL (1-2 hours)      Critically ill patients:  140 - 180 mg/dL   Results for Tracy [name], Tracy [name] (MRN [id number]) as of 03/23/2015 08:01  Ref. Range 03/22/2015 07:50 03/22/2015 11:20 03/22/2015 16:28 03/22/2015 21:38  Glucose-Capillary Latest Range: 70-99 mg/dL 175 (H) 217 (H) 210 (H) 194 (H)   Results for Tracy [name], Tracy [name] (MRN [id number]) as of 03/23/2015 08:01  Ref. Range 03/23/2015 04:36  Glucose Latest Range: 70-99 mg/dL 271 (H)   Diabetes history: DM2 Outpatient Diabetes medications: Metformin 500 mg QAM Current orders for Inpatient glycemic control: Metformin 500 mg QAM, Novolog 0-20 units TID with meals, Novolog 0-5 units HS  Inpatient Diabetes Program Recommendations Insulin - Basal: If steroids are continued, please consider ordering low dose basal insulin. Recommend starting with Levemir 10 units Q24H (if steroids are reduced, may want to start with Levemir 5 untis Q24H).  Thanks, Barnie Alderman, RN, MSN, CCRN, CDE Diabetes Coordinator Inpatient Diabetes Program [phone] (Team Pager from [place] to [place]) [phone] ([facility] office) [phone] [facility] office)

## 2015-03-23 NOTE — Progress Notes (Signed)
Patient educated on not smoking in her room and risks associated with doing so.  She was informed that she could walk around the unit but could not leave the unit.

## 2015-03-24 LAB — BASIC METABOLIC PANEL
Anion gap: 10 (ref 5–15)
BUN: 31 mg/dL — AB (ref 6–23)
CALCIUM: 8.6 mg/dL (ref 8.4–10.5)
CO2: 27 mmol/L (ref 19–32)
Chloride: 103 mmol/L (ref 96–112)
Creatinine, Ser: 1.29 mg/dL — ABNORMAL HIGH (ref 0.50–1.10)
GFR calc non Af Amer: 46 mL/min — ABNORMAL LOW (ref >90)
GFR, EST AFRICAN AMERICAN: 53 mL/min — AB (ref >90)
GLUCOSE: 271 mg/dL — AB (ref 70–99)
Potassium: 3.7 mmol/L (ref 3.5–5.1)
SODIUM: 140 mmol/L (ref 135–145)

## 2015-03-24 LAB — GLUCOSE, CAPILLARY
GLUCOSE-CAPILLARY: 253 mg/dL — AB (ref 70–99)
GLUCOSE-CAPILLARY: 270 mg/dL — AB (ref 70–99)
Glucose-Capillary: 321 mg/dL — ABNORMAL HIGH (ref 70–99)
Glucose-Capillary: 348 mg/dL — ABNORMAL HIGH (ref 70–99)
Glucose-Capillary: 151 mg/dL — ABNORMAL HIGH (ref 70–99)
Glucose-Capillary: 203 mg/dL — ABNORMAL HIGH (ref 70–99)

## 2015-03-24 LAB — PROTIME-INR
INR: 3.65 — ABNORMAL HIGH (ref 0.00–1.49)
PROTHROMBIN TIME: 36.6 s — AB (ref 11.6–15.2)

## 2015-03-24 MED ORDER — METOPROLOL TARTRATE 25 MG PO TABS
25.0000 mg | ORAL_TABLET | Freq: Once | ORAL | Status: AC
  Administered 2015-03-24: 25 mg via ORAL
  Filled 2015-03-24: qty 1

## 2015-03-24 MED ORDER — INSULIN DETEMIR 100 UNIT/ML ~~LOC~~ SOLN
10.0000 [IU] | Freq: Every day | SUBCUTANEOUS | Status: DC
  Administered 2015-03-24: 10 [IU] via SUBCUTANEOUS
  Filled 2015-03-24 (×3): qty 0.1

## 2015-03-24 MED ORDER — HYDRALAZINE HCL 20 MG/ML IJ SOLN
10.0000 mg | Freq: Four times a day (QID) | INTRAMUSCULAR | Status: DC | PRN
Start: 2015-03-24 — End: 2015-03-25
  Administered 2015-03-24: 10 mg via INTRAVENOUS
  Filled 2015-03-24: qty 1

## 2015-03-24 MED ORDER — INSULIN ASPART 100 UNIT/ML ~~LOC~~ SOLN
5.0000 [IU] | Freq: Three times a day (TID) | SUBCUTANEOUS | Status: DC
Start: 2015-03-24 — End: 2015-03-25
  Administered 2015-03-24 – 2015-03-25 (×3): 5 [IU] via SUBCUTANEOUS

## 2015-03-24 MED ORDER — HYDRALAZINE HCL 25 MG PO TABS
25.0000 mg | ORAL_TABLET | Freq: Three times a day (TID) | ORAL | Status: DC
  Administered 2015-03-24 – 2015-03-25 (×4): 25 mg via ORAL
  Filled 2015-03-24 (×4): qty 1

## 2015-03-24 MED ORDER — METOPROLOL TARTRATE 25 MG PO TABS
25.0000 mg | ORAL_TABLET | Freq: Two times a day (BID) | ORAL | Status: DC
  Administered 2015-03-24 – 2015-03-25 (×2): 25 mg via ORAL
  Filled 2015-03-24 (×2): qty 1

## 2015-03-24 MED ORDER — LEVOFLOXACIN 500 MG PO TABS
500.0000 mg | ORAL_TABLET | Freq: Every day | ORAL | Status: DC
  Administered 2015-03-24 – 2015-03-25 (×2): 500 mg via ORAL
  Filled 2015-03-24 (×2): qty 1

## 2015-03-24 NOTE — Progress Notes (Signed)
Late Entry/ Follow up:  Notified Dr. Roderic Palau of the patients ambulation home O2 assessment.  Also notified him of the patients blood pressure and the interventions.  I voiced to him I had recently given the Metoprolol and Hydralazine approx 1500.  I will recheck the blood pressure and continue to monitor him.  See the home O2 documentation progress note.

## 2015-03-24 NOTE — Progress Notes (Signed)
SATURATION QUALIFICATIONS: (This note is used to comply with regulatory documentation for home oxygen)  Patient Saturations on Room Air at Rest = 92%  Patient Saturations on Room Air while Ambulating = 85%  Patient Saturations on 4 Liters of oxygen while Ambulating = 91%  Had to titrate the 02 from 2 liters to 4 LPM.  Please briefly explain why patient needs home oxygen:  During the ambulation the patient was SOB and tachypenic. She stated that she normally breathes this way I will notify the MD.

## 2015-03-24 NOTE — Progress Notes (Signed)
PHARMACIST - PHYSICIAN COMMUNICATION DR:   Roderic Palau CONCERNING: Antibiotic IV to Oral Route Change Policy  RECOMMENDATION: This patient is receiving Levaquin by the intravenous route.  Based on criteria approved by the Pharmacy and Therapeutics Committee, the antibiotic(s) is/are being converted to the equivalent oral dose form(s).   DESCRIPTION: These criteria include:  Patient being treated for a respiratory tract infection, urinary tract infection, cellulitis or clostridium difficile associated diarrhea if on metronidazole  The patient is not neutropenic and does not exhibit a GI malabsorption state  The patient is eating (either orally or via tube) and/or has been taking other orally administered medications for a least 24 hours  The patient is improving clinically and has a Tmax < 100.5  If you have questions about this conversion, please contact the Pharmacy Department  [x]   [phone] )  [facility] []   [phone] )  [facility]  []   [phone] )  [facility] []   [phone] )  [facility]    S. [place], PharmD

## 2015-03-24 NOTE — Progress Notes (Addendum)
[facility] PROGRESS NOTE  Tracy [name] [id number] DOB: 1961/12/14 DOA: 03/21/2015 PCP: MUSE,ROCHELLE D., PA-C  Assessment/Plan: 1. COPD exacerbation. Likely precipitated by influenza. On intravenous steroids, bronchodilators and antibiotics. Clinically, she appears to be slowly improving, but is still very short of breath on exertion. She has a significant oxygen requirement on ambulation. Continue current treatments.  2. Acute respiratory failure with hypoxia. Related COPD. She will likely need to be discharged home on supplemental oxygen. 3. Influenza with respiratory manifestations. On Tamiflu as well as droplet precautions. 4. Acute renal failure related to dehydration. Improved with IV fluids. 5. Hypertension. Blood pressures have been running high. Add hydralazine. 6. Diabetes. Blood sugars remain uncontrolled. Will start novolog with meals and levemir for basal coverage while on steroids 7. Ongoing tobacco use. Patient counseled on the importance of tobacco cessation 8. History of the VTE. Continue on Coumadin per pharmacy 9. Transient atrial fibrillation. Patient developed transient atrial fibrillation which quickly resolved with Cardizem infusion. She is currently in sinus rhythm. This was likely driven by her underlying pulmonary issues. Discussed with cardiology and no further workup recommended.  Code Status: full code Family Communication: discussed with patient Disposition Plan: discharge home once improved   Consultants:    Procedures:    Antibiotics:  levaquin 3/22>>  HPI/Subjective: Feels that breathing is approaching baseline. No wheezing or coughing. Patient was ambulated today and became very short of breath, requiring 4L of oxygen to maintain saturations above 90%  Objective: Filed Vitals:   03/24/15 1450  BP: 190/10  Pulse: 85  Temp:   Resp:     Intake/Output Summary (Last 24 hours) at 03/24/15 1535 Last data filed at 03/24/15  1225  Gross per 24 hour  Intake    720 ml  Output    600 ml  Net    120 ml   Filed Weights   03/21/15 1627 03/22/15 0400 03/23/15 0500  Weight: 113.1 kg (249 lb 5.4 oz) 113.8 kg (250 lb 14.1 oz) 116.6 kg (257 lb 0.9 oz)    Exam:   General:  Increased work of breathing, sitting up in bed  Cardiovascular: s1, s2, rrr  Respiratory: diminished breath sounds bilaterally, now heezing  Abdomen: soft, nt, nd, bs_  Musculoskeletal: no edema b/l   Data Reviewed: Basic Metabolic Panel:  Recent Labs Lab 03/21/15 1030 03/22/15 0440 03/23/15 0436 03/24/15 0645  NA 137 138 137 140  K 3.7 4.2 3.7 3.7  CL 100 103 104 103  CO2 24 24 22 27   GLUCOSE 174* 188* 271* 271*  BUN 21 23 26* 31*  CREATININE 1.37* 1.16* 1.09 1.29*  CALCIUM 9.0 8.3* 8.4 8.6   Liver Function Tests:  Recent Labs Lab 03/22/15 0440  AST 54*  ALT 39*  ALKPHOS 63  BILITOT 0.5  PROT 6.2  ALBUMIN 3.0*   No results for input(s): LIPASE, AMYLASE in the last 168 hours. No results for input(s): AMMONIA in the last 168 hours. CBC:  Recent Labs Lab 03/21/15 1030 03/22/15 0440 03/23/15 0436  WBC 7.6 6.8 8.7  NEUTROABS 6.7  --   --   HGB 16.5* 15.6* 15.3*  HCT 50.1* 47.4* 46.9*  MCV 87.9 90.3 90.0  PLT 150 118* 139*   Cardiac Enzymes:  Recent Labs Lab 03/21/15 1030  TROPONINI 0.11*   BNP (last 3 results)  Recent Labs  03/21/15 1030  BNP 88.0    ProBNP (last 3 results) No results for input(s): PROBNP in the last 8760 hours.  CBG:  Recent  Labs Lab 03/23/15 1626 03/23/15 2123 03/24/15 0748 03/24/15 1006 03/24/15 1134  GLUCAP 169* 223* 253* 321* 348*    Recent Results (from the past 240 hour(s))  Blood culture (routine x 2)     Status: None (Preliminary result)   Collection Time: 03/21/15 11:40 AM  Result Value Ref Range Status   Specimen Description BLOOD RIGHT ANTECUBITAL  Final   Special Requests BOTTLES DRAWN AEROBIC AND ANAEROBIC 5CC  Final   Culture NO GROWTH 2 DAYS   Final   Report Status PENDING  Incomplete  Blood culture (routine x 2)     Status: None (Preliminary result)   Collection Time: 03/21/15 12:20 PM  Result Value Ref Range Status   Specimen Description BLOOD RIGHT ARM  Final   Special Requests BOTTLES DRAWN AEROBIC AND ANAEROBIC [place]  Final   Culture NO GROWTH 2 DAYS  Final   Report Status PENDING  Incomplete  MRSA PCR Screening     Status: None   Collection Time: 03/21/15  4:25 PM  Result Value Ref Range Status   MRSA by PCR NEGATIVE NEGATIVE Final    Comment:        The GeneXpert MRSA Assay (FDA approved for NASAL specimens only), is one component of a comprehensive MRSA colonization surveillance program. It is not intended to diagnose MRSA infection nor to guide or monitor treatment for MRSA infections.      Studies: No results found.  Scheduled Meds: . amLODipine  10 mg Oral Daily  . aspirin EC  81 mg Oral Daily  . atorvastatin  10 mg Oral QHS  . gabapentin  300 mg Oral TID  . guaiFENesin  1,200 mg Oral BID  . lisinopril  20 mg Oral Daily   And  . hydrochlorothiazide  12.5 mg Oral Daily  . insulin aspart  0-20 Units Subcutaneous TID WC  . insulin aspart  0-5 Units Subcutaneous QHS  . ipratropium  0.5 mg Nebulization Q4H  . levalbuterol  0.63 mg Nebulization Q4H  . levofloxacin  500 mg Oral Daily  . metFORMIN  500 mg Oral QPC breakfast  . methylPREDNISolone (SOLU-MEDROL) injection  60 mg Intravenous Q6H  . metoprolol tartrate  25 mg Oral BID  . nicotine  21 mg Transdermal Daily  . oseltamivir  75 mg Oral BID  . sodium chloride  3 mL Intravenous Q12H  . Warfarin - Pharmacist Dosing Inpatient   Does not apply Q24H   Continuous Infusions:    Active Problems:   Tobacco abuse   Essential hypertension   Diabetes   Acute respiratory failure   COPD exacerbation   Influenza with respiratory manifestations    Time spent: 68mins    Easter Schinke  [facility] Pager [phone]. If 7PM-7AM, please contact  night-coverage at [email or web address], password [facility] 03/24/2015, 3:35 PM  LOS: 3 days

## 2015-03-24 NOTE — Progress Notes (Signed)
Inpatient Diabetes Program Recommendations  AACE/ADA: New Consensus Statement on Inpatient Glycemic Control (2013)  Target Ranges:  Prepandial:   less than 140 mg/dL      Peak postprandial:   less than 180 mg/dL (1-2 hours)      Critically ill patients:  140 - 180 mg/dL  Results for Tracy [name], Tracy [name] (MRN [id number]) as of 03/24/2015 10:05  Ref. Range 03/24/2015 06:45  Glucose Latest Range: 70-99 mg/dL 271 (H)   Results for Tracy [name], Tracy [name] (MRN [id number]) as of 03/24/2015 10:05  Ref. Range 03/23/2015 07:51 03/23/2015 11:29 03/23/2015 16:26 03/23/2015 21:23  Glucose-Capillary Latest Range: 70-99 mg/dL 239 (H) 232 (H) 169 (H) 223 (H)   Diabetes history: DM2 Outpatient Diabetes medications: Metformin 500 mg QAM Current orders for Inpatient glycemic control: Metformin 500 mg QAM, Novolog 0-20 units TID with meals, Novolog 0-5 units HS  Inpatient Diabetes Program Recommendations Insulin - Basal: Please consider ordering low dose basal insulin; recommend starting with Levemir 5 units daily. Insulin - Meal Coverage: If steroids are continued, please consider ordering Novolog 3 units TID meal coverage (in addition to Novolog correction).  Thanks, Barnie Alderman, RN, MSN, CCRN, CDE Diabetes Coordinator Inpatient Diabetes Program [phone] (Team Pager from [place] to [place]) [phone] ([facility] office) [phone] [facility], [place] office)

## 2015-03-24 NOTE — Progress Notes (Signed)
ANTICOAGULATION CONSULT NOTE  Pharmacy Consult for Coumadin Indication: DVT  No Known Allergies  Patient Measurements: Height: 5' 7" (170.2 cm) Weight: 257 lb 0.9 oz (116.6 kg) IBW/kg (Calculated) : 61.6  Vital Signs: Temp: 97.6 F (36.4 C) (03/25 0616) Temp Source: Oral (03/25 0616) BP: 190/100 mmHg (03/25 1012) Pulse Rate: 73 (03/25 0616)  Labs:  Recent Labs  03/22/15 0440 03/23/15 0436 03/24/15 0645  HGB 15.6* 15.3*  --   HCT 47.4* 46.9*  --   PLT 118* 139*  --   LABPROT 22.3* 24.7* 36.6*  INR 1.94* 2.20* 3.65*  CREATININE 1.16* 1.09 1.29*   Estimated Creatinine Clearance: 65.8 mL/min (by C-G formula based on Cr of 1.29).  Medical History: Past Medical History  Diagnosis Date  . Hypertension   . Myocardial infarct, old   . Tobacco abuse   . Crack cocaine use   . Acute ischemic stroke 07/03/2012  . Hemiparesis, acute 07/04/2012  . Thrombocytopenia 07/03/2012  . LVH (left ventricular hypertrophy) 07/04/2012    Ejection fraction 60%.  . Diabetes   . Diabetes mellitus   . DVT (deep venous thrombosis)    Medications:  Prescriptions prior to admission  Medication Sig Dispense Refill Last Dose  . albuterol (PROVENTIL HFA;VENTOLIN HFA) 108 (90 BASE) MCG/ACT inhaler Inhale 2 puffs into the lungs every 4 (four) hours as needed for wheezing or shortness of breath. 1 Inhaler 0 03/21/2015 at Unknown time  . aspirin EC 81 MG tablet Take 81 mg by mouth daily.   03/21/2015 at Unknown time  . atorvastatin (LIPITOR) 10 MG tablet Take 10 mg by mouth at bedtime.   03/21/2015 at Unknown time  . gabapentin (NEURONTIN) 300 MG capsule Take 300 mg by mouth 3 (three) times daily.   03/21/2015 at Unknown time  . HYDROcodone-acetaminophen (NORCO) 7.5-325 MG per tablet Take 1 tablet by mouth every 4 (four) hours as needed for moderate pain.   03/21/2015 at Unknown time  . lisinopril-hydrochlorothiazide (PRINZIDE,ZESTORETIC) 20-12.5 MG per tablet Take 2 tablets by mouth daily.   03/21/2015 at  Unknown time  . metFORMIN (GLUCOPHAGE-XR) 500 MG 24 hr tablet Take 500 mg by mouth daily.   03/21/2015 at Unknown time  . metoprolol tartrate (LOPRESSOR) 25 MG tablet Take 12.5 mg by mouth 2 (two) times daily.   03/21/2015 at  0900  . traZODone (DESYREL) 50 MG tablet Take 50 mg by mouth at bedtime as needed for sleep.   03/20/2015 at Unknown time  . warfarin (COUMADIN) 5 MG tablet Take 1 tablet (5 mg total) by mouth daily. 30 tablet 0 03/21/2015 at Unknown time  . enoxaparin (LOVENOX) 100 MG/ML injection Inject 1 mL (100 mg total) into the skin every 12 (twelve) hours. (Patient not taking: Reported on 03/21/2015) 10 Syringe 1   . enoxaparin (LOVENOX) KIT 1 kit by Does not apply route once. (Patient not taking: Reported on 03/21/2015) 1 kit 0   . HYDROcodone-acetaminophen (NORCO/VICODIN) 5-325 MG per tablet Take 1-2 tablets by mouth every 6 (six) hours as needed for pain. (Patient not taking: Reported on 03/21/2015) 20 tablet 0    Assessment: 54yo F on chronic Coumadin PTA for h/o DVT.  Home dose listed above.  INR was below goal on admission but has now trended up to SUPRAtherapeutic range.     Patient is also on Levaquin which could interact with Coumadin to increase INR. No bleeding noted.   Goal of Therapy:  INR 2-3   Plan:  HOLD Coumadin today INR daily  [place] Crane, Golden Gilreath A 03/24/2015,10:31 AM

## 2015-03-25 LAB — GLUCOSE, CAPILLARY
GLUCOSE-CAPILLARY: 291 mg/dL — AB (ref 70–99)
Glucose-Capillary: 243 mg/dL — ABNORMAL HIGH (ref 70–99)

## 2015-03-25 LAB — PROTIME-INR
INR: 4.49 — AB (ref 0.00–1.49)
Prothrombin Time: 43 seconds — ABNORMAL HIGH (ref 11.6–15.2)

## 2015-03-25 MED ORDER — WARFARIN SODIUM 5 MG PO TABS: 5.0000 mg | ORAL_TABLET | Freq: Every day | ORAL | Status: DC

## 2015-03-25 MED ORDER — METOPROLOL TARTRATE 25 MG PO TABS: 25.0000 mg | ORAL_TABLET | Freq: Two times a day (BID) | ORAL | Status: DC

## 2015-03-25 MED ORDER — AMLODIPINE BESYLATE 10 MG PO TABS: 10.0000 mg | ORAL_TABLET | Freq: Every day | ORAL | Status: DC

## 2015-03-25 MED ORDER — NICOTINE 21 MG/24HR TD PT24: 21.0000 mg | MEDICATED_PATCH | Freq: Every day | TRANSDERMAL | Status: DC

## 2015-03-25 MED ORDER — HYDRALAZINE HCL 25 MG PO TABS: 25.0000 mg | ORAL_TABLET | Freq: Three times a day (TID) | ORAL | Status: DC

## 2015-03-25 MED ORDER — OSELTAMIVIR PHOSPHATE 75 MG PO CAPS: 75.0000 mg | ORAL_CAPSULE | Freq: Two times a day (BID) | ORAL | Status: DC

## 2015-03-25 MED ORDER — PREDNISONE 10 MG PO TABS: ORAL_TABLET | ORAL | Status: DC

## 2015-03-25 MED ORDER — IPRATROPIUM-ALBUTEROL 0.5-2.5 (3) MG/3ML IN SOLN: 3.0000 mL | RESPIRATORY_TRACT | Status: DC | PRN

## 2015-03-25 MED ORDER — HYDROCODONE-ACETAMINOPHEN 7.5-325 MG PO TABS: 1.0000 | ORAL_TABLET | ORAL | Status: DC | PRN

## 2015-03-25 MED ORDER — GUAIFENESIN ER 600 MG PO TB12: 1200.0000 mg | ORAL_TABLET | Freq: Two times a day (BID) | ORAL | Status: DC

## 2015-03-25 NOTE — Plan of Care (Signed)
SATURATION QUALIFICATIONS: (This note is used to comply with regulatory documentation for home oxygen)  Patient Saturations on Room Air at Rest = 88%  Patient Saturations on Room Air while Ambulating = n/a because she was 88% at rest  Patient Saturations on 2 Liters of oxygen while Ambulating = 93%  Please briefly explain why patient needs home oxygen:  Patient becomes SOB with exertion.    Dr. Roderic Palau was notified of the ambulation and Oxygen on rest and while ambulating  2 lpm.

## 2015-03-25 NOTE — Progress Notes (Signed)
ANTICOAGULATION CONSULT NOTE  Pharmacy Consult for Coumadin Indication: DVT  No Known Allergies  Patient Measurements: Height: 5\' 7"  (170.2 cm) Weight: 257 lb 0.9 oz (116.6 kg) IBW/kg (Calculated) : 61.6  Vital Signs: Temp: 98.3 F (36.8 C) (03/26 0505) Temp Source: Oral (03/26 0505) BP: 157/98 mmHg (03/26 0501) Pulse Rate: 70 (03/26 0505)  Labs:  Recent Labs  03/23/15 0436 03/24/15 0645 03/25/15 0615  HGB 15.3*  --   --   HCT 46.9*  --   --   PLT 139*  --   --   LABPROT 24.7* 36.6* 43.0*  INR 2.20* 3.65* 4.49*  CREATININE 1.09 1.29*  --    Estimated Creatinine Clearance: 65.8 mL/min (by C-G formula based on Cr of 1.29).  Medical History: Past Medical History  Diagnosis Date  . Hypertension   . Myocardial infarct, old   . Tobacco abuse   . Crack cocaine use   . Acute ischemic stroke 07/03/2012  . Hemiparesis, acute 07/04/2012  . Thrombocytopenia 07/03/2012  . LVH (left ventricular hypertrophy) 07/04/2012    Ejection fraction 60%.  . Diabetes   . Diabetes mellitus   . DVT (deep venous thrombosis)    Medications:  Prescriptions prior to admission  Medication Sig Dispense Refill Last Dose  . albuterol (PROVENTIL HFA;VENTOLIN HFA) 108 (90 BASE) MCG/ACT inhaler Inhale 2 puffs into the lungs every 4 (four) hours as needed for wheezing or shortness of breath. 1 Inhaler 0 03/21/2015 at Unknown time  . aspirin EC 81 MG tablet Take 81 mg by mouth daily.   03/21/2015 at Unknown time  . atorvastatin (LIPITOR) 10 MG tablet Take 10 mg by mouth at bedtime.   03/21/2015 at Unknown time  . gabapentin (NEURONTIN) 300 MG capsule Take 300 mg by mouth 3 (three) times daily.   03/21/2015 at Unknown time  . HYDROcodone-acetaminophen (NORCO) 7.5-325 MG per tablet Take 1 tablet by mouth every 4 (four) hours as needed for moderate pain.   03/21/2015 at Unknown time  . lisinopril-hydrochlorothiazide (PRINZIDE,ZESTORETIC) 20-12.5 MG per tablet Take 2 tablets by mouth daily.   03/21/2015 at Unknown  time  . metFORMIN (GLUCOPHAGE-XR) 500 MG 24 hr tablet Take 500 mg by mouth daily.   03/21/2015 at Unknown time  . metoprolol tartrate (LOPRESSOR) 25 MG tablet Take 12.5 mg by mouth 2 (two) times daily.   03/21/2015 at  0900  . traZODone (DESYREL) 50 MG tablet Take 50 mg by mouth at bedtime as needed for sleep.   03/20/2015 at Unknown time  . warfarin (COUMADIN) 5 MG tablet Take 1 tablet (5 mg total) by mouth daily. 30 tablet 0 03/21/2015 at Unknown time  . enoxaparin (LOVENOX) 100 MG/ML injection Inject 1 mL (100 mg total) into the skin every 12 (twelve) hours. (Patient not taking: Reported on 03/21/2015) 10 Syringe 1   . enoxaparin (LOVENOX) KIT 1 kit by Does not apply route once. (Patient not taking: Reported on 03/21/2015) 1 kit 0   . HYDROcodone-acetaminophen (NORCO/VICODIN) 5-325 MG per tablet Take 1-2 tablets by mouth every 6 (six) hours as needed for pain. (Patient not taking: Reported on 03/21/2015) 20 tablet 0    Assessment: 54yo F on chronic Coumadin PTA for h/o DVT.  Home dose listed above.  INR was below goal on admission but has now trended up to SUPRAtherapeutic range.     Patient is also on Levaquin which can interact with Coumadin to increase INR. No bleeding noted.   Goal of Therapy:  INR 2-3  Plan:  HOLD Coumadin today INR daily  [place] Crane, Kemonie Cutillo A 03/25/2015,9:25 AM

## 2015-03-25 NOTE — Progress Notes (Signed)
Patient discharged with instructions, prescription, and care notes.  Verbalized understanding via teach back.  IV was removed and the site was WNL. Patient voiced no further complaints or concerns at the time of discharge.  Appointments scheduled per instructions.  Patient left the floor via w/c with staff and family in stable condition.  @ LPM N/C was placed on the patient prior to leave the floor and was going until she got into the car.  The patient is aware that it could be later on tonight before the home O2 equipment and neb is delivered.  She verbalized understanding and states she still wants to go home without.  She is advised that if she has any problems prior to the O2 coming that she is welcome to return to the ED.

## 2015-03-25 NOTE — Progress Notes (Signed)
Check on patient at [facility] patient resting with out problems no treatment given.

## 2015-03-25 NOTE — Discharge Summary (Signed)
Physician Discharge Summary  Tracy [name] OIN:[id number] DOB: September 22, 1961 DOA: 03/21/2015  PCP: Royce Macadamia D., PA-C  Admit date: 03/21/2015 Discharge date: 03/25/2015  Time spent: 40 minutes  Recommendations for Outpatient Follow-up:  1. Follow up with primary care physician in 1-2 weeks  Discharge Diagnoses:  Active Problems:   Tobacco abuse   Essential hypertension   Diabetes   Acute respiratory failure   COPD exacerbation   Influenza with respiratory manifestations   Discharge Condition: improved  Diet recommendation: low salt, low carb  Filed Weights   03/21/15 1627 03/22/15 0400 03/23/15 0500  Weight: 113.1 kg (249 lb 5.4 oz) 113.8 kg (250 lb 14.1 oz) 116.6 kg (257 lb 0.9 oz)    History of present illness:  Patient was admitted to the hospital with shortness of breath and cough. She was found to be hypoxic, requiring supplemental oxygen related to a COPD exacerbation  Hospital Course:  Patient was admitted to the hospital and started on scheduled bronchodilators, intravenous steroids and antibiotics. Her shortness of breath and wheezing have improved. She now able to ambulate on 2 L of oxygen and maintained saturations greater than 90%. She has completed a course of antibiotics. She is placed on a prednisone taper. She is been placed on nicotine patches for tobacco abuse and has been counseled on the importance of tobacco cessation. She did test positive for influenza and received a course of Tamiflu. Blood pressure remains elevated and she was started on hydralazine in addition to Norvasc and metoprolol. Blood sugars were running high related to steroid use. Anticipate this should normalize once her steroid taper is complete. 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  Procedures:  none  Consultations:  none  Discharge Exam: Filed Vitals:   03/25/15 0505  BP:   Pulse: 70  Temp: 98.3 F (36.8 C)  Resp: 20     General: NAD Cardiovascular: s1, s2, rrr Respiratory: fair air entry bilaterally  Discharge Instructions   Discharge Instructions    Call MD for:  difficulty breathing, headache or visual disturbances    Complete by:  As directed      Call MD for:  temperature >100.4    Complete by:  As directed      Diet - low sodium heart healthy    Complete by:  As directed      Diet Carb Modified    Complete by:  As directed      Increase activity slowly    Complete by:  As directed           Current Discharge Medication List    START taking these medications   Details  guaiFENesin (MUCINEX) 600 MG 12 hr tablet Take 2 tablets (1,200 mg total) by mouth 2 (two) times daily. Qty: 20 tablet, Refills: 0    hydrALAZINE (APRESOLINE) 25 MG tablet Take 1 tablet (25 mg total) by mouth every 8 (eight) hours. Qty: 90 tablet, Refills: 1    ipratropium-albuterol (DUONEB) 0.5-2.5 (3) MG/3ML SOLN Take 3 mLs by nebulization every 4 (four) hours as needed. Qty: 360 mL, Refills: 1    nicotine (NICODERM CQ - DOSED IN MG/24 HOURS) 21 mg/24hr patch Place 1 patch (21 mg total) onto the skin daily. Qty: 28 patch, Refills: 0    oseltamivir (TAMIFLU) 75 MG capsule Take 1 capsule (75 mg total) by mouth 2 (two) times daily. Qty: 4 capsule, Refills: 0    predniSONE (DELTASONE) 10 MG tablet Take 73m po  daily for 2 days then 2m daily for 2 days then 252mpo daily for 2 days then 1074mo daily for 2 days then stop Qty: 20 tablet, Refills: 0      CONTINUE these medications which have CHANGED   Details  amLODipine (NORVASC) 10 MG tablet Take 1 tablet (10 mg total) by mouth daily. Qty: 30 tablet, Refills: 1    HYDROcodone-acetaminophen (NORCO) 7.5-325 MG per tablet Take 1 tablet by mouth every 4 (four) hours as needed for moderate pain. Qty: 15 tablet, Refills: 0    metoprolol tartrate (LOPRESSOR) 25 MG tablet Take 1 tablet (25 mg total) by mouth 2 (two) times daily. Qty: 60 tablet, Refills: 1     warfarin (COUMADIN) 5 MG tablet Take 1 tablet (5 mg total) by mouth daily. Resume on 3/29 Qty: 30 tablet, Refills: 0      CONTINUE these medications which have NOT CHANGED   Details  albuterol (PROVENTIL HFA;VENTOLIN HFA) 108 (90 BASE) MCG/ACT inhaler Inhale 2 puffs into the lungs every 4 (four) hours as needed for wheezing or shortness of breath. Qty: 1 Inhaler, Refills: 0    aspirin EC 81 MG tablet Take 81 mg by mouth daily.    atorvastatin (LIPITOR) 10 MG tablet Take 10 mg by mouth at bedtime.    gabapentin (NEURONTIN) 300 MG capsule Take 300 mg by mouth 3 (three) times daily.    lisinopril-hydrochlorothiazide (PRINZIDE,ZESTORETIC) 20-12.5 MG per tablet Take 2 tablets by mouth daily.    metFORMIN (GLUCOPHAGE-XR) 500 MG 24 hr tablet Take 500 mg by mouth daily.    traZODone (DESYREL) 50 MG tablet Take 50 mg by mouth at bedtime as needed for sleep.      STOP taking these medications     enoxaparin (LOVENOX) 100 MG/ML injection      enoxaparin (LOVENOX) KIT      HYDROcodone-acetaminophen (NORCO/VICODIN) 5-325 MG per tablet        No Known Allergies Follow-up Information    Follow up with MUSE,ROCHELLE D., PA-C. Schedule an appointment as soon as possible for a visit in 2 weeks.   Contact information:   [address] [place] [phone]     The results of significant diagnostics from this hospitalization (including imaging, microbiology, ancillary and laboratory) are listed below for reference.    Significant Diagnostic Studies: Ct Angio Chest Pe W/cm &/or Wo Cm  03/21/2015   CLINICAL DATA:  [age]ar old female with shortness of breath cough and hypoxia since yesterday. Initial encounter. Current history of smoking. Personal history of DVT.  EXAM: CT ANGIOGRAPHY CHEST WITH CONTRAST  TECHNIQUE: Multidetector CT imaging of the chest was performed using the standard protocol during bolus administration of intravenous contrast. Multiplanar CT image  reconstructions and MIPs were obtained to evaluate the vascular anatomy.  CONTRAST:  100m2mNIPAQUE IOHEXOL 350 MG/ML SOLN  COMPARISON:  Chest radiographs 03/21/2015 and earlier. Chest CT 07/04/2013.  FINDINGS: Adequate contrast bolus timing in the pulmonary arterial tree. Respiratory motion artifact. The right pulmonary artery lower lobe and middle lobe branches appear diminutive and nonenhancing and most likely are thrombosed (compare series 10, image 45 today to series 2, image 32 in 2014). Allowing for motion artifact, other bilateral pulmonary artery branches appear to remain patent. There is mild central pulmonary artery enlargement.  Atelectatic changes to the major airways. Dependent atelectasis as well as bilateral lower lobe scarring suspected. Upper lobe centrilobular emphysema.  Cardiomegaly. No pericardial or pleural effusion. There  is contrast reflux into the hepatic IVC and hepatic veins. Chronic right peritracheal lymph node enlargement appears stable since 2014. Thyromegaly. Tortuous proximal great vessels. Calcified atherosclerosis of the aorta.  No axillary lymphadenopathy. Negative visualized upper abdominal viscera.  No acute osseous abnormality identified. Degenerative changes in the spine.  Review of the MIP images confirms the above findings.  IMPRESSION: 1. No evidence of acute pulmonary embolus. 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 2. Lower lobe scarring.  Upper lobe emphysema. 3. Cardiomegaly.   Electronically Signed   By: Genevie Ann M.D.   On: 03/21/2015 14:53   Dg Chest Port 1 View  03/21/2015   CLINICAL DATA:  SOB X COUPLE DAYS, ASTHMA, EMPHYSEMA, HTN, SMOKER 1 PK/DAY  EXAM: PORTABLE CHEST - 1 VIEW  COMPARISON:  07/20/2013  FINDINGS: Increase in central pulmonary vascular congestion. No overt interstitial edema or confluent airspace disease. Stable cardiomegaly. Ectatic thoracic aorta. No effusion.  No pneumothorax. Spurring in the mid  thoracic spine stable.  IMPRESSION: 1. Progressive pulmonary vascular congestion without focal infiltrate.   Electronically Signed   By: Lucrezia Europe M.D.   On: 03/21/2015 10:53    Microbiology: Recent Results (from the past 240 hour(s))  Blood culture (routine x 2)     Status: None (Preliminary result)   Collection Time: 03/21/15 11:40 AM  Result Value Ref Range Status   Specimen Description BLOOD RIGHT ANTECUBITAL  Final   Special Requests BOTTLES DRAWN AEROBIC AND ANAEROBIC 5CC  Final   Culture NO GROWTH 4 DAYS  Final   Report Status PENDING  Incomplete  Blood culture (routine x 2)     Status: None (Preliminary result)   Collection Time: 03/21/15 12:20 PM  Result Value Ref Range Status   Specimen Description BLOOD RIGHT ARM  Final   Special Requests BOTTLES DRAWN AEROBIC AND ANAEROBIC [place]  Final   Culture NO GROWTH 4 DAYS  Final   Report Status PENDING  Incomplete  MRSA PCR Screening     Status: None   Collection Time: 03/21/15  4:25 PM  Result Value Ref Range Status   MRSA by PCR NEGATIVE NEGATIVE Final    Comment:        The GeneXpert MRSA Assay (FDA approved for NASAL specimens only), is one component of a comprehensive MRSA colonization surveillance program. It is not intended to diagnose MRSA infection nor to guide or monitor treatment for MRSA infections.      Labs: Basic Metabolic Panel:  Recent Labs Lab 03/21/15 1030 03/22/15 0440 03/23/15 0436 03/24/15 0645  NA 137 138 137 140  K 3.7 4.2 3.7 3.7  CL 100 103 104 103  CO2 _0 GLUCOSE 174* 188* 271* 271*  BUN 21 23 26* 31*  CREATININE 1.37* 1.16* 1.09 1.29*  CALCIUM 9.0 8.3* 8.4 8.6   Liver Function Tests:  Recent Labs Lab 03/22/15 0440  AST 54*  ALT 39*  ALKPHOS 63  BILITOT 0.5  PROT 6.2  ALBUMIN 3.0*   No results for input(s): LIPASE, AMYLASE in the last 168 hours. No results for input(s): AMMONIA in the last 168 hours. CBC:  Recent Labs Lab 03/21/15 1030 03/22/15 0440  03/23/15 0436  WBC 7.6 6.8 8.7  NEUTROABS 6.7  --   --   HGB 16.5* 15.6* 15.3*  HCT 50.1* 47.4* 46.9*  MCV 87.9 90.3 90.0  PLT 150 118* 139*   Cardiac Enzymes:  Recent Labs Lab 03/21/15 1030  TROPONINI  0.11*   BNP: BNP (last 3 results)  Recent Labs  03/21/15 1030  BNP 88.0    ProBNP (last 3 results) No results for input(s): PROBNP in the last 8760 hours.  CBG:  Recent Labs Lab 03/24/15 1449 03/24/15 1706 03/24/15 2121 03/25/15 0733 03/25/15 1127  GLUCAP 270* 151* 203* 243* 291*       Signed:  MEMON,JEHANZEB  [facility] 03/25/2015, 2:29 PM

## 2015-03-25 NOTE — Progress Notes (Signed)
Notified [place] of the patient requiring O2 at home and that she would be discharged home today.  Spoke with Amedeo Plenty at the office and voiced to her that all equipment should be delivered to the home.  Voiced to her that the patient refuses to stay at the hospital and wait for the portable to be delivered here.  She verbalized understanding and asked me to put that on the fax, and so I did.  I faxed to the office the order for O2, face sheet, and the progress note for the O2 saturations.  I did discuss with Dr. Roderic Palau that the patient stated that she was not waiting for the O2 to be delivered since she stay a short distance from the hospital.  He verbalized understanding.  I did voice to her she is at risk for having respiratory distress without the O2 administering with the transfer and while at home waiting on the O2 to be delivered.  She verbalized understanding and still wants to go without.

## 2015-03-25 NOTE — Progress Notes (Deleted)
Physician Discharge Summary  Tracy [name] OIN:[id number] DOB: September 22, 1961 DOA: 03/21/2015  PCP: Royce Macadamia D., PA-C  Admit date: 03/21/2015 Discharge date: 03/25/2015  Time spent: 40 minutes  Recommendations for Outpatient Follow-up:  1. Follow up with primary care physician in 1-2 weeks  Discharge Diagnoses:  Active Problems:   Tobacco abuse   Essential hypertension   Diabetes   Acute respiratory failure   COPD exacerbation   Influenza with respiratory manifestations   Discharge Condition: improved  Diet recommendation: low salt, low carb  Filed Weights   03/21/15 1627 03/22/15 0400 03/23/15 0500  Weight: 113.1 kg (249 lb 5.4 oz) 113.8 kg (250 lb 14.1 oz) 116.6 kg (257 lb 0.9 oz)    History of present illness:  Patient was admitted to the hospital with shortness of breath and cough. She was found to be hypoxic, requiring supplemental oxygen related to a COPD exacerbation  Hospital Course:  Patient was admitted to the hospital and started on scheduled bronchodilators, intravenous steroids and antibiotics. Her shortness of breath and wheezing have improved. She now able to ambulate on 2 L of oxygen and maintained saturations greater than 90%. She has completed a course of antibiotics. She is placed on a prednisone taper. She is been placed on nicotine patches for tobacco abuse and has been counseled on the importance of tobacco cessation. She did test positive for influenza and received a course of Tamiflu. Blood pressure remains elevated and she was started on hydralazine in addition to Norvasc and metoprolol. Blood sugars were running high related to steroid use. Anticipate this should normalize once her steroid taper is complete. 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  Procedures:  none  Consultations:  none  Discharge Exam: Filed Vitals:   03/25/15 0505  BP:   Pulse: 70  Temp: 98.3 F (36.8 C)  Resp: 20     General: NAD Cardiovascular: s1, s2, rrr Respiratory: fair air entry bilaterally  Discharge Instructions   Discharge Instructions    Call MD for:  difficulty breathing, headache or visual disturbances    Complete by:  As directed      Call MD for:  temperature >100.4    Complete by:  As directed      Diet - low sodium heart healthy    Complete by:  As directed      Diet Carb Modified    Complete by:  As directed      Increase activity slowly    Complete by:  As directed           Current Discharge Medication List    START taking these medications   Details  guaiFENesin (MUCINEX) 600 MG 12 hr tablet Take 2 tablets (1,200 mg total) by mouth 2 (two) times daily. Qty: 20 tablet, Refills: 0    hydrALAZINE (APRESOLINE) 25 MG tablet Take 1 tablet (25 mg total) by mouth every 8 (eight) hours. Qty: 90 tablet, Refills: 1    ipratropium-albuterol (DUONEB) 0.5-2.5 (3) MG/3ML SOLN Take 3 mLs by nebulization every 4 (four) hours as needed. Qty: 360 mL, Refills: 1    nicotine (NICODERM CQ - DOSED IN MG/24 HOURS) 21 mg/24hr patch Place 1 patch (21 mg total) onto the skin daily. Qty: 28 patch, Refills: 0    oseltamivir (TAMIFLU) 75 MG capsule Take 1 capsule (75 mg total) by mouth 2 (two) times daily. Qty: 4 capsule, Refills: 0    predniSONE (DELTASONE) 10 MG tablet Take 73m po  daily for 2 days then 2m daily for 2 days then 252mpo daily for 2 days then 1074mo daily for 2 days then stop Qty: 20 tablet, Refills: 0      CONTINUE these medications which have CHANGED   Details  amLODipine (NORVASC) 10 MG tablet Take 1 tablet (10 mg total) by mouth daily. Qty: 30 tablet, Refills: 1    HYDROcodone-acetaminophen (NORCO) 7.5-325 MG per tablet Take 1 tablet by mouth every 4 (four) hours as needed for moderate pain. Qty: 15 tablet, Refills: 0    metoprolol tartrate (LOPRESSOR) 25 MG tablet Take 1 tablet (25 mg total) by mouth 2 (two) times daily. Qty: 60 tablet, Refills: 1     warfarin (COUMADIN) 5 MG tablet Take 1 tablet (5 mg total) by mouth daily. Resume on 3/29 Qty: 30 tablet, Refills: 0      CONTINUE these medications which have NOT CHANGED   Details  albuterol (PROVENTIL HFA;VENTOLIN HFA) 108 (90 BASE) MCG/ACT inhaler Inhale 2 puffs into the lungs every 4 (four) hours as needed for wheezing or shortness of breath. Qty: 1 Inhaler, Refills: 0    aspirin EC 81 MG tablet Take 81 mg by mouth daily.    atorvastatin (LIPITOR) 10 MG tablet Take 10 mg by mouth at bedtime.    gabapentin (NEURONTIN) 300 MG capsule Take 300 mg by mouth 3 (three) times daily.    lisinopril-hydrochlorothiazide (PRINZIDE,ZESTORETIC) 20-12.5 MG per tablet Take 2 tablets by mouth daily.    metFORMIN (GLUCOPHAGE-XR) 500 MG 24 hr tablet Take 500 mg by mouth daily.    traZODone (DESYREL) 50 MG tablet Take 50 mg by mouth at bedtime as needed for sleep.      STOP taking these medications     enoxaparin (LOVENOX) 100 MG/ML injection      enoxaparin (LOVENOX) KIT      HYDROcodone-acetaminophen (NORCO/VICODIN) 5-325 MG per tablet        No Known Allergies Follow-up Information    Follow up with MUSE,ROCHELLE D., PA-C. Schedule an appointment as soon as possible for a visit in 2 weeks.   Contact information:   [address] [place] [phone]     The results of significant diagnostics from this hospitalization (including imaging, microbiology, ancillary and laboratory) are listed below for reference.    Significant Diagnostic Studies: Ct Angio Chest Pe W/cm &/or Wo Cm  03/21/2015   CLINICAL DATA:  [age]ar old female with shortness of breath cough and hypoxia since yesterday. Initial encounter. Current history of smoking. Personal history of DVT.  EXAM: CT ANGIOGRAPHY CHEST WITH CONTRAST  TECHNIQUE: Multidetector CT imaging of the chest was performed using the standard protocol during bolus administration of intravenous contrast. Multiplanar CT image  reconstructions and MIPs were obtained to evaluate the vascular anatomy.  CONTRAST:  100m2mNIPAQUE IOHEXOL 350 MG/ML SOLN  COMPARISON:  Chest radiographs 03/21/2015 and earlier. Chest CT 07/04/2013.  FINDINGS: Adequate contrast bolus timing in the pulmonary arterial tree. Respiratory motion artifact. The right pulmonary artery lower lobe and middle lobe branches appear diminutive and nonenhancing and most likely are thrombosed (compare series 10, image 45 today to series 2, image 32 in 2014). Allowing for motion artifact, other bilateral pulmonary artery branches appear to remain patent. There is mild central pulmonary artery enlargement.  Atelectatic changes to the major airways. Dependent atelectasis as well as bilateral lower lobe scarring suspected. Upper lobe centrilobular emphysema.  Cardiomegaly. No pericardial or pleural effusion. There  is contrast reflux into the hepatic IVC and hepatic veins. Chronic right peritracheal lymph node enlargement appears stable since 2014. Thyromegaly. Tortuous proximal great vessels. Calcified atherosclerosis of the aorta.  No axillary lymphadenopathy. Negative visualized upper abdominal viscera.  No acute osseous abnormality identified. Degenerative changes in the spine.  Review of the MIP images confirms the above findings.  IMPRESSION: 1. No evidence of acute pulmonary embolus. 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 2. Lower lobe scarring.  Upper lobe emphysema. 3. Cardiomegaly.   Electronically Signed   By: Genevie Ann M.D.   On: 03/21/2015 14:53   Dg Chest Port 1 View  03/21/2015   CLINICAL DATA:  SOB X COUPLE DAYS, ASTHMA, EMPHYSEMA, HTN, SMOKER 1 PK/DAY  EXAM: PORTABLE CHEST - 1 VIEW  COMPARISON:  07/20/2013  FINDINGS: Increase in central pulmonary vascular congestion. No overt interstitial edema or confluent airspace disease. Stable cardiomegaly. Ectatic thoracic aorta. No effusion.  No pneumothorax. Spurring in the mid  thoracic spine stable.  IMPRESSION: 1. Progressive pulmonary vascular congestion without focal infiltrate.   Electronically Signed   By: Lucrezia Europe M.D.   On: 03/21/2015 10:53    Microbiology: Recent Results (from the past 240 hour(s))  Blood culture (routine x 2)     Status: None (Preliminary result)   Collection Time: 03/21/15 11:40 AM  Result Value Ref Range Status   Specimen Description BLOOD RIGHT ANTECUBITAL  Final   Special Requests BOTTLES DRAWN AEROBIC AND ANAEROBIC 5CC  Final   Culture NO GROWTH 4 DAYS  Final   Report Status PENDING  Incomplete  Blood culture (routine x 2)     Status: None (Preliminary result)   Collection Time: 03/21/15 12:20 PM  Result Value Ref Range Status   Specimen Description BLOOD RIGHT ARM  Final   Special Requests BOTTLES DRAWN AEROBIC AND ANAEROBIC [place]  Final   Culture NO GROWTH 4 DAYS  Final   Report Status PENDING  Incomplete  MRSA PCR Screening     Status: None   Collection Time: 03/21/15  4:25 PM  Result Value Ref Range Status   MRSA by PCR NEGATIVE NEGATIVE Final    Comment:        The GeneXpert MRSA Assay (FDA approved for NASAL specimens only), is one component of a comprehensive MRSA colonization surveillance program. It is not intended to diagnose MRSA infection nor to guide or monitor treatment for MRSA infections.      Labs: Basic Metabolic Panel:  Recent Labs Lab 03/21/15 1030 03/22/15 0440 03/23/15 0436 03/24/15 0645  NA 137 138 137 140  K 3.7 4.2 3.7 3.7  CL 100 103 104 103  CO2 _0 GLUCOSE 174* 188* 271* 271*  BUN 21 23 26* 31*  CREATININE 1.37* 1.16* 1.09 1.29*  CALCIUM 9.0 8.3* 8.4 8.6   Liver Function Tests:  Recent Labs Lab 03/22/15 0440  AST 54*  ALT 39*  ALKPHOS 63  BILITOT 0.5  PROT 6.2  ALBUMIN 3.0*   No results for input(s): LIPASE, AMYLASE in the last 168 hours. No results for input(s): AMMONIA in the last 168 hours. CBC:  Recent Labs Lab 03/21/15 1030 03/22/15 0440  03/23/15 0436  WBC 7.6 6.8 8.7  NEUTROABS 6.7  --   --   HGB 16.5* 15.6* 15.3*  HCT 50.1* 47.4* 46.9*  MCV 87.9 90.3 90.0  PLT 150 118* 139*   Cardiac Enzymes:  Recent Labs Lab 03/21/15 1030  TROPONINI  0.11*   BNP: BNP (last 3 results)  Recent Labs  03/21/15 1030  BNP 88.0    ProBNP (last 3 results) No results for input(s): PROBNP in the last 8760 hours.  CBG:  Recent Labs Lab 03/24/15 1449 03/24/15 1706 03/24/15 2121 03/25/15 0733 03/25/15 1127  GLUCAP 270* 151* 203* 243* 291*       Signed:  Vertie Dibbern  [facility] 03/25/2015, 2:20 PM

## 2015-03-27 LAB — CULTURE, BLOOD (ROUTINE X 2)
Culture: NO GROWTH
Culture: NO GROWTH

## 2015-03-27 NOTE — Progress Notes (Signed)
UR chart review completed.  

## 2015-03-30 DIAGNOSIS — M79609 Pain in unspecified limb: Secondary | ICD-10-CM | POA: Diagnosis not present

## 2015-03-30 DIAGNOSIS — B351 Tinea unguium: Secondary | ICD-10-CM | POA: Diagnosis not present

## 2015-04-11 DIAGNOSIS — F17200 Nicotine dependence, unspecified, uncomplicated: Secondary | ICD-10-CM | POA: Diagnosis not present

## 2015-04-11 DIAGNOSIS — I10 Essential (primary) hypertension: Secondary | ICD-10-CM | POA: Diagnosis not present

## 2015-04-11 DIAGNOSIS — J449 Chronic obstructive pulmonary disease, unspecified: Secondary | ICD-10-CM | POA: Diagnosis not present

## 2015-04-11 DIAGNOSIS — E119 Type 2 diabetes mellitus without complications: Secondary | ICD-10-CM | POA: Diagnosis not present

## 2015-04-25 DIAGNOSIS — I10 Essential (primary) hypertension: Secondary | ICD-10-CM | POA: Diagnosis not present

## 2015-04-25 DIAGNOSIS — J449 Chronic obstructive pulmonary disease, unspecified: Secondary | ICD-10-CM | POA: Diagnosis not present

## 2015-04-25 DIAGNOSIS — E119 Type 2 diabetes mellitus without complications: Secondary | ICD-10-CM | POA: Diagnosis not present

## 2015-04-25 DIAGNOSIS — F17200 Nicotine dependence, unspecified, uncomplicated: Secondary | ICD-10-CM | POA: Diagnosis not present

## 2015-04-25 DIAGNOSIS — R600 Localized edema: Secondary | ICD-10-CM | POA: Diagnosis not present

## 2015-04-25 DIAGNOSIS — Z7901 Long term (current) use of anticoagulants: Secondary | ICD-10-CM | POA: Diagnosis not present

## 2015-04-25 DIAGNOSIS — I80299 Phlebitis and thrombophlebitis of other deep vessels of unspecified lower extremity: Secondary | ICD-10-CM | POA: Diagnosis not present

## 2015-05-10 DIAGNOSIS — Z7901 Long term (current) use of anticoagulants: Secondary | ICD-10-CM | POA: Diagnosis not present

## 2015-05-22 DIAGNOSIS — E119 Type 2 diabetes mellitus without complications: Secondary | ICD-10-CM | POA: Diagnosis not present

## 2015-05-22 DIAGNOSIS — H2513 Age-related nuclear cataract, bilateral: Secondary | ICD-10-CM | POA: Diagnosis not present

## 2015-05-22 DIAGNOSIS — H4423 Degenerative myopia, bilateral: Secondary | ICD-10-CM | POA: Diagnosis not present

## 2015-05-22 DIAGNOSIS — E11319 Type 2 diabetes mellitus with unspecified diabetic retinopathy without macular edema: Secondary | ICD-10-CM | POA: Diagnosis not present

## 2015-05-26 DIAGNOSIS — J449 Chronic obstructive pulmonary disease, unspecified: Secondary | ICD-10-CM | POA: Diagnosis not present

## 2015-05-26 DIAGNOSIS — F17200 Nicotine dependence, unspecified, uncomplicated: Secondary | ICD-10-CM | POA: Diagnosis not present

## 2015-05-26 DIAGNOSIS — Z7901 Long term (current) use of anticoagulants: Secondary | ICD-10-CM | POA: Diagnosis not present

## 2015-05-26 DIAGNOSIS — I10 Essential (primary) hypertension: Secondary | ICD-10-CM | POA: Diagnosis not present

## 2015-05-26 DIAGNOSIS — E119 Type 2 diabetes mellitus without complications: Secondary | ICD-10-CM | POA: Diagnosis not present

## 2015-05-26 DIAGNOSIS — R600 Localized edema: Secondary | ICD-10-CM | POA: Diagnosis not present

## 2015-05-26 DIAGNOSIS — I80299 Phlebitis and thrombophlebitis of other deep vessels of unspecified lower extremity: Secondary | ICD-10-CM | POA: Diagnosis not present

## 2015-06-07 DIAGNOSIS — I10 Essential (primary) hypertension: Secondary | ICD-10-CM | POA: Diagnosis not present

## 2015-06-07 DIAGNOSIS — Z6841 Body Mass Index (BMI) 40.0 and over, adult: Secondary | ICD-10-CM | POA: Diagnosis not present

## 2015-06-07 DIAGNOSIS — M25562 Pain in left knee: Secondary | ICD-10-CM | POA: Diagnosis not present

## 2015-06-07 DIAGNOSIS — M25561 Pain in right knee: Secondary | ICD-10-CM | POA: Diagnosis not present

## 2015-06-07 DIAGNOSIS — Z5181 Encounter for therapeutic drug level monitoring: Secondary | ICD-10-CM | POA: Diagnosis not present

## 2015-06-12 DIAGNOSIS — Z7901 Long term (current) use of anticoagulants: Secondary | ICD-10-CM | POA: Diagnosis not present

## 2015-06-15 DIAGNOSIS — I739 Peripheral vascular disease, unspecified: Secondary | ICD-10-CM | POA: Diagnosis not present

## 2015-06-15 DIAGNOSIS — E1151 Type 2 diabetes mellitus with diabetic peripheral angiopathy without gangrene: Secondary | ICD-10-CM | POA: Diagnosis not present

## 2015-06-15 DIAGNOSIS — L603 Nail dystrophy: Secondary | ICD-10-CM | POA: Diagnosis not present

## 2015-06-19 DIAGNOSIS — I80299 Phlebitis and thrombophlebitis of other deep vessels of unspecified lower extremity: Secondary | ICD-10-CM | POA: Diagnosis not present

## 2015-07-07 DIAGNOSIS — E119 Type 2 diabetes mellitus without complications: Secondary | ICD-10-CM | POA: Diagnosis not present

## 2015-07-07 DIAGNOSIS — J449 Chronic obstructive pulmonary disease, unspecified: Secondary | ICD-10-CM | POA: Diagnosis not present

## 2015-07-07 DIAGNOSIS — Z7901 Long term (current) use of anticoagulants: Secondary | ICD-10-CM | POA: Diagnosis not present

## 2015-07-07 DIAGNOSIS — I10 Essential (primary) hypertension: Secondary | ICD-10-CM | POA: Diagnosis not present

## 2015-07-07 DIAGNOSIS — N393 Stress incontinence (female) (male): Secondary | ICD-10-CM | POA: Diagnosis not present

## 2015-07-07 DIAGNOSIS — I80299 Phlebitis and thrombophlebitis of other deep vessels of unspecified lower extremity: Secondary | ICD-10-CM | POA: Diagnosis not present

## 2015-07-07 DIAGNOSIS — R600 Localized edema: Secondary | ICD-10-CM | POA: Diagnosis not present

## 2015-07-07 DIAGNOSIS — F17200 Nicotine dependence, unspecified, uncomplicated: Secondary | ICD-10-CM | POA: Diagnosis not present

## 2015-07-24 DIAGNOSIS — I69361 Other paralytic syndrome following cerebral infarction affecting right dominant side: Secondary | ICD-10-CM | POA: Diagnosis not present

## 2015-07-24 DIAGNOSIS — F17200 Nicotine dependence, unspecified, uncomplicated: Secondary | ICD-10-CM | POA: Diagnosis not present

## 2015-07-24 DIAGNOSIS — I10 Essential (primary) hypertension: Secondary | ICD-10-CM | POA: Diagnosis not present

## 2015-07-24 DIAGNOSIS — J449 Chronic obstructive pulmonary disease, unspecified: Secondary | ICD-10-CM | POA: Diagnosis not present

## 2015-08-11 DIAGNOSIS — E119 Type 2 diabetes mellitus without complications: Secondary | ICD-10-CM | POA: Diagnosis not present

## 2015-08-11 DIAGNOSIS — F17200 Nicotine dependence, unspecified, uncomplicated: Secondary | ICD-10-CM | POA: Diagnosis not present

## 2015-08-11 DIAGNOSIS — I10 Essential (primary) hypertension: Secondary | ICD-10-CM | POA: Diagnosis not present

## 2015-08-11 DIAGNOSIS — I80299 Phlebitis and thrombophlebitis of other deep vessels of unspecified lower extremity: Secondary | ICD-10-CM | POA: Diagnosis not present

## 2015-08-18 DIAGNOSIS — I80299 Phlebitis and thrombophlebitis of other deep vessels of unspecified lower extremity: Secondary | ICD-10-CM | POA: Diagnosis not present

## 2015-08-18 DIAGNOSIS — R600 Localized edema: Secondary | ICD-10-CM | POA: Diagnosis not present

## 2015-08-18 DIAGNOSIS — F17200 Nicotine dependence, unspecified, uncomplicated: Secondary | ICD-10-CM | POA: Diagnosis not present

## 2015-08-18 DIAGNOSIS — I10 Essential (primary) hypertension: Secondary | ICD-10-CM | POA: Diagnosis not present

## 2015-08-18 DIAGNOSIS — N393 Stress incontinence (female) (male): Secondary | ICD-10-CM | POA: Diagnosis not present

## 2015-08-18 DIAGNOSIS — Z7901 Long term (current) use of anticoagulants: Secondary | ICD-10-CM | POA: Diagnosis not present

## 2015-08-18 DIAGNOSIS — E119 Type 2 diabetes mellitus without complications: Secondary | ICD-10-CM | POA: Diagnosis not present

## 2015-08-18 DIAGNOSIS — J449 Chronic obstructive pulmonary disease, unspecified: Secondary | ICD-10-CM | POA: Diagnosis not present

## 2015-08-31 DIAGNOSIS — I739 Peripheral vascular disease, unspecified: Secondary | ICD-10-CM | POA: Diagnosis not present

## 2015-08-31 DIAGNOSIS — E1151 Type 2 diabetes mellitus with diabetic peripheral angiopathy without gangrene: Secondary | ICD-10-CM | POA: Diagnosis not present

## 2015-08-31 DIAGNOSIS — L603 Nail dystrophy: Secondary | ICD-10-CM | POA: Diagnosis not present

## 2015-09-06 DIAGNOSIS — J449 Chronic obstructive pulmonary disease, unspecified: Secondary | ICD-10-CM | POA: Diagnosis not present

## 2015-09-06 DIAGNOSIS — I639 Cerebral infarction, unspecified: Secondary | ICD-10-CM | POA: Diagnosis not present

## 2015-09-06 DIAGNOSIS — Z720 Tobacco use: Secondary | ICD-10-CM | POA: Diagnosis not present

## 2015-09-06 DIAGNOSIS — Z8719 Personal history of other diseases of the digestive system: Secondary | ICD-10-CM | POA: Diagnosis not present

## 2015-09-06 DIAGNOSIS — E119 Type 2 diabetes mellitus without complications: Secondary | ICD-10-CM | POA: Diagnosis not present

## 2015-09-06 DIAGNOSIS — Z6841 Body Mass Index (BMI) 40.0 and over, adult: Secondary | ICD-10-CM | POA: Diagnosis not present

## 2015-09-06 DIAGNOSIS — I10 Essential (primary) hypertension: Secondary | ICD-10-CM | POA: Diagnosis not present

## 2015-09-06 DIAGNOSIS — D649 Anemia, unspecified: Secondary | ICD-10-CM | POA: Diagnosis not present

## 2015-09-06 DIAGNOSIS — R600 Localized edema: Secondary | ICD-10-CM | POA: Diagnosis not present

## 2015-09-08 ENCOUNTER — Other Ambulatory Visit (HOSPITAL_COMMUNITY): Payer: Self-pay | Admitting: Physician Assistant

## 2015-09-08 DIAGNOSIS — Z1231 Encounter for screening mammogram for malignant neoplasm of breast: Secondary | ICD-10-CM

## 2015-09-14 ENCOUNTER — Ambulatory Visit (HOSPITAL_COMMUNITY)
Admission: RE | Admit: 2015-09-14 | Discharge: 2015-09-14 | Disposition: A | Discharge status: Home / Self Care | Payer: Medicare Other | Source: Ambulatory Visit | Attending: Physician Assistant | Admitting: Physician Assistant

## 2015-09-14 ENCOUNTER — Ambulatory Visit (HOSPITAL_COMMUNITY): Payer: Medicare Other

## 2015-09-14 ENCOUNTER — Other Ambulatory Visit (HOSPITAL_COMMUNITY): Payer: Self-pay | Admitting: Physician Assistant

## 2015-09-14 DIAGNOSIS — Z1231 Encounter for screening mammogram for malignant neoplasm of breast: Secondary | ICD-10-CM | POA: Insufficient documentation

## 2015-09-21 DIAGNOSIS — I80299 Phlebitis and thrombophlebitis of other deep vessels of unspecified lower extremity: Secondary | ICD-10-CM | POA: Diagnosis not present

## 2015-09-25 DIAGNOSIS — E119 Type 2 diabetes mellitus without complications: Secondary | ICD-10-CM | POA: Diagnosis not present

## 2015-09-25 DIAGNOSIS — H2513 Age-related nuclear cataract, bilateral: Secondary | ICD-10-CM | POA: Diagnosis not present

## 2015-09-25 DIAGNOSIS — H2511 Age-related nuclear cataract, right eye: Secondary | ICD-10-CM | POA: Diagnosis not present

## 2015-09-28 DIAGNOSIS — I80299 Phlebitis and thrombophlebitis of other deep vessels of unspecified lower extremity: Secondary | ICD-10-CM | POA: Diagnosis not present

## 2015-10-05 DIAGNOSIS — I80299 Phlebitis and thrombophlebitis of other deep vessels of unspecified lower extremity: Secondary | ICD-10-CM | POA: Diagnosis not present

## 2015-10-05 DIAGNOSIS — Z23 Encounter for immunization: Secondary | ICD-10-CM | POA: Diagnosis not present

## 2015-10-19 DIAGNOSIS — I80299 Phlebitis and thrombophlebitis of other deep vessels of unspecified lower extremity: Secondary | ICD-10-CM | POA: Diagnosis not present

## 2015-10-19 DIAGNOSIS — J449 Chronic obstructive pulmonary disease, unspecified: Secondary | ICD-10-CM | POA: Diagnosis not present

## 2015-10-26 DIAGNOSIS — I80299 Phlebitis and thrombophlebitis of other deep vessels of unspecified lower extremity: Secondary | ICD-10-CM | POA: Diagnosis not present

## 2015-11-14 DIAGNOSIS — I80299 Phlebitis and thrombophlebitis of other deep vessels of unspecified lower extremity: Secondary | ICD-10-CM | POA: Diagnosis not present

## 2015-11-14 DIAGNOSIS — R600 Localized edema: Secondary | ICD-10-CM | POA: Diagnosis not present

## 2015-11-14 DIAGNOSIS — E119 Type 2 diabetes mellitus without complications: Secondary | ICD-10-CM | POA: Diagnosis not present

## 2015-11-14 DIAGNOSIS — I10 Essential (primary) hypertension: Secondary | ICD-10-CM | POA: Diagnosis not present

## 2015-11-16 DIAGNOSIS — L603 Nail dystrophy: Secondary | ICD-10-CM | POA: Diagnosis not present

## 2015-11-16 DIAGNOSIS — I739 Peripheral vascular disease, unspecified: Secondary | ICD-10-CM | POA: Diagnosis not present

## 2015-11-16 DIAGNOSIS — E1151 Type 2 diabetes mellitus with diabetic peripheral angiopathy without gangrene: Secondary | ICD-10-CM | POA: Diagnosis not present

## 2015-11-17 DIAGNOSIS — Z7901 Long term (current) use of anticoagulants: Secondary | ICD-10-CM | POA: Diagnosis not present

## 2015-11-17 DIAGNOSIS — N393 Stress incontinence (female) (male): Secondary | ICD-10-CM | POA: Diagnosis not present

## 2015-11-17 DIAGNOSIS — I80299 Phlebitis and thrombophlebitis of other deep vessels of unspecified lower extremity: Secondary | ICD-10-CM | POA: Diagnosis not present

## 2015-11-17 DIAGNOSIS — R600 Localized edema: Secondary | ICD-10-CM | POA: Diagnosis not present

## 2015-11-17 DIAGNOSIS — E119 Type 2 diabetes mellitus without complications: Secondary | ICD-10-CM | POA: Diagnosis not present

## 2015-11-17 DIAGNOSIS — F17200 Nicotine dependence, unspecified, uncomplicated: Secondary | ICD-10-CM | POA: Diagnosis not present

## 2015-11-17 DIAGNOSIS — J449 Chronic obstructive pulmonary disease, unspecified: Secondary | ICD-10-CM | POA: Diagnosis not present

## 2015-11-17 DIAGNOSIS — I10 Essential (primary) hypertension: Secondary | ICD-10-CM | POA: Diagnosis not present

## 2015-12-05 DIAGNOSIS — Z8719 Personal history of other diseases of the digestive system: Secondary | ICD-10-CM | POA: Diagnosis not present

## 2015-12-05 DIAGNOSIS — I10 Essential (primary) hypertension: Secondary | ICD-10-CM | POA: Diagnosis not present

## 2015-12-05 DIAGNOSIS — D649 Anemia, unspecified: Secondary | ICD-10-CM | POA: Diagnosis not present

## 2015-12-05 DIAGNOSIS — E119 Type 2 diabetes mellitus without complications: Secondary | ICD-10-CM | POA: Diagnosis not present

## 2015-12-05 DIAGNOSIS — M25562 Pain in left knee: Secondary | ICD-10-CM | POA: Diagnosis not present

## 2015-12-05 DIAGNOSIS — Z6841 Body Mass Index (BMI) 40.0 and over, adult: Secondary | ICD-10-CM | POA: Diagnosis not present

## 2015-12-05 DIAGNOSIS — Z720 Tobacco use: Secondary | ICD-10-CM | POA: Diagnosis not present

## 2015-12-05 DIAGNOSIS — M25561 Pain in right knee: Secondary | ICD-10-CM | POA: Diagnosis not present

## 2015-12-05 DIAGNOSIS — R600 Localized edema: Secondary | ICD-10-CM | POA: Diagnosis not present

## 2015-12-14 DIAGNOSIS — Z7901 Long term (current) use of anticoagulants: Secondary | ICD-10-CM | POA: Diagnosis not present

## 2015-12-29 DIAGNOSIS — I80299 Phlebitis and thrombophlebitis of other deep vessels of unspecified lower extremity: Secondary | ICD-10-CM | POA: Diagnosis not present

## 2015-12-29 DIAGNOSIS — R05 Cough: Secondary | ICD-10-CM | POA: Diagnosis not present

## 2016-01-02 DIAGNOSIS — I80299 Phlebitis and thrombophlebitis of other deep vessels of unspecified lower extremity: Secondary | ICD-10-CM | POA: Diagnosis not present

## 2016-01-16 DIAGNOSIS — I80299 Phlebitis and thrombophlebitis of other deep vessels of unspecified lower extremity: Secondary | ICD-10-CM | POA: Diagnosis not present

## 2016-01-26 DIAGNOSIS — L603 Nail dystrophy: Secondary | ICD-10-CM | POA: Diagnosis not present

## 2016-01-26 DIAGNOSIS — I739 Peripheral vascular disease, unspecified: Secondary | ICD-10-CM | POA: Diagnosis not present

## 2016-01-26 DIAGNOSIS — E1151 Type 2 diabetes mellitus with diabetic peripheral angiopathy without gangrene: Secondary | ICD-10-CM | POA: Diagnosis not present

## 2016-01-30 DIAGNOSIS — I80299 Phlebitis and thrombophlebitis of other deep vessels of unspecified lower extremity: Secondary | ICD-10-CM | POA: Diagnosis not present

## 2016-02-05 ENCOUNTER — Telehealth: Payer: Self-pay | Admitting: Orthopaedic Surgery

## 2016-02-05 NOTE — Telephone Encounter (Signed)
Patient called to get refill of Hydrocodone. Please advise.

## 2016-02-06 MED ORDER — HYDROCODONE-ACETAMINOPHEN 7.5-325 MG PO TABS: 1.0000 | ORAL_TABLET | ORAL | Status: DC | PRN

## 2016-02-06 NOTE — Telephone Encounter (Signed)
Patient picked up prescription.

## 2016-02-06 NOTE — Telephone Encounter (Signed)
Rx Printed

## 2016-02-12 DIAGNOSIS — I10 Essential (primary) hypertension: Secondary | ICD-10-CM | POA: Diagnosis not present

## 2016-02-12 DIAGNOSIS — J449 Chronic obstructive pulmonary disease, unspecified: Secondary | ICD-10-CM | POA: Diagnosis not present

## 2016-02-12 DIAGNOSIS — E119 Type 2 diabetes mellitus without complications: Secondary | ICD-10-CM | POA: Diagnosis not present

## 2016-02-14 DIAGNOSIS — J449 Chronic obstructive pulmonary disease, unspecified: Secondary | ICD-10-CM | POA: Diagnosis not present

## 2016-02-14 DIAGNOSIS — I10 Essential (primary) hypertension: Secondary | ICD-10-CM | POA: Diagnosis not present

## 2016-02-14 DIAGNOSIS — Z0000 Encounter for general adult medical examination without abnormal findings: Secondary | ICD-10-CM | POA: Diagnosis not present

## 2016-02-14 DIAGNOSIS — E119 Type 2 diabetes mellitus without complications: Secondary | ICD-10-CM | POA: Diagnosis not present

## 2016-02-14 DIAGNOSIS — R05 Cough: Secondary | ICD-10-CM | POA: Diagnosis not present

## 2016-02-14 DIAGNOSIS — N393 Stress incontinence (female) (male): Secondary | ICD-10-CM | POA: Diagnosis not present

## 2016-02-14 DIAGNOSIS — I80299 Phlebitis and thrombophlebitis of other deep vessels of unspecified lower extremity: Secondary | ICD-10-CM | POA: Diagnosis not present

## 2016-02-14 DIAGNOSIS — Z7901 Long term (current) use of anticoagulants: Secondary | ICD-10-CM | POA: Diagnosis not present

## 2016-02-14 DIAGNOSIS — R600 Localized edema: Secondary | ICD-10-CM | POA: Diagnosis not present

## 2016-02-14 DIAGNOSIS — F17200 Nicotine dependence, unspecified, uncomplicated: Secondary | ICD-10-CM | POA: Diagnosis not present

## 2016-02-27 DIAGNOSIS — Z7901 Long term (current) use of anticoagulants: Secondary | ICD-10-CM | POA: Diagnosis not present

## 2016-03-05 ENCOUNTER — Ambulatory Visit (INDEPENDENT_AMBULATORY_CARE_PROVIDER_SITE_OTHER): Payer: Medicare Other | Admitting: Orthopaedic Surgery

## 2016-03-05 ENCOUNTER — Encounter: Payer: Self-pay | Admitting: Orthopaedic Surgery

## 2016-03-05 VITALS — BP 138/86 | HR 107 | Temp 97.5°F | Resp 20 | Ht 67.0 in | Wt 250.0 lb

## 2016-03-05 DIAGNOSIS — M25562 Pain in left knee: Secondary | ICD-10-CM | POA: Insufficient documentation

## 2016-03-05 MED ORDER — HYDROCODONE-ACETAMINOPHEN 7.5-325 MG PO TABS: 1.0000 | ORAL_TABLET | ORAL | Status: DC | PRN

## 2016-03-05 NOTE — Progress Notes (Addendum)
Patient [name], female DOB:1961-06-17, 54 y.o. [id number]  Chief Complaint  Patient presents with  . Follow-up    follow up right knee pain    HPI  Tracy [name] is a 55 y.o. female who has right knee pian chronically.  She has swelling and popping but no giving way.  She has no redness.  Knee Pain  The pain is present in the right knee. The quality of the pain is described as aching. The pain is at a severity of 3/10. The pain is mild. The pain has been fluctuating since onset. Associated symptoms include an inability to bear weight and a loss of motion. Pertinent negatives include no numbness or tingling. The symptoms are aggravated by weight bearing. She has tried non-weight bearing, rest and heat for the symptoms. The treatment provided mild relief.    Body mass index is 39.15 kg/(m^2).   Review of Systems  Constitutional:       Patient has Diabetes Mellitus. Patient has hypertension. Patient has COPD or shortness of breath. Patient has BMI > 35. Patient has current smoking history.  HENT: Negative for congestion.   Respiratory: Positive for cough and shortness of breath.   Cardiovascular: Negative for chest pain.  Endocrine: Positive for cold intolerance.  Musculoskeletal: Positive for myalgias, joint swelling, arthralgias and gait problem.  Allergic/Immunologic: Positive for environmental allergies.  Neurological: Negative for tingling and numbness.    Past Medical History  Diagnosis Date  . Hypertension   . Myocardial infarct, old   . Tobacco abuse   . Crack cocaine use   . Acute ischemic stroke ([place]) 07/03/2012  . Hemiparesis, acute ([place]) 07/04/2012  . Thrombocytopenia ([place]) 07/03/2012  . LVH (left ventricular hypertrophy) 07/04/2012    Ejection fraction 60%.  . Diabetes ([place])   . Diabetes mellitus ([place])   . DVT (deep venous thrombosis) [facility])     Past Surgical History  Procedure Laterality Date  . Abdominal hysterectomy      No family  history on file.  Social History Social History  Substance Use Topics  . Smoking status: Current Every Day Smoker -- 1.00 packs/day    Types: Cigarettes  . Smokeless tobacco: None  . Alcohol Use: No    No Known Allergies  Current Outpatient Prescriptions  Medication Sig Dispense Refill  . albuterol (PROVENTIL HFA;VENTOLIN HFA) 108 (90 BASE) MCG/ACT inhaler Inhale 2 puffs into the lungs every 4 (four) hours as needed for wheezing or shortness of breath. 1 Inhaler 0  . amLODipine (NORVASC) 10 MG tablet Take 1 tablet (10 mg total) by mouth daily. 30 tablet 1  . atorvastatin (LIPITOR) 10 MG tablet Take 10 mg by mouth at bedtime.    . Fluticasone-Salmeterol (ADVAIR) 250-50 MCG/DOSE AEPB Inhale 1 puff into the lungs 2 (two) times daily.    [name] gabapentin (NEURONTIN) 300 MG capsule Take 300 mg by mouth 3 (three) times daily.    [name] guaiFENesin (MUCINEX) 600 MG 12 hr tablet Take 2 tablets (1,200 mg total) by mouth 2 (two) times daily. 20 tablet 0  . hydrALAZINE (APRESOLINE) 25 MG tablet Take 1 tablet (25 mg total) by mouth every 8 (eight) hours. 90 tablet 1  . HYDROcodone-acetaminophen (NORCO) 7.5-325 MG tablet Take 1 tablet by mouth every 4 (four) hours as needed for moderate pain (Must last 30 days.  Do not drive or operate machinery while taking this medicine.). 120 tablet 0  . ipratropium-albuterol (DUONEB) 0.5-2.5 (3) MG/3ML SOLN Take 3 mLs by nebulization every 4 (four)  hours as needed. 360 mL 1  . lisinopril-hydrochlorothiazide (PRINZIDE,ZESTORETIC) 20-12.5 MG per tablet Take 2 tablets by mouth daily.    . metFORMIN (GLUCOPHAGE-XR) 500 MG 24 hr tablet Take 500 mg by mouth daily.    . metoprolol tartrate (LOPRESSOR) 25 MG tablet Take 1 tablet (25 mg total) by mouth 2 (two) times daily. 60 tablet 1  . tiotropium (SPIRIVA) 18 MCG inhalation capsule Place 18 mcg into inhaler and inhale daily.    [name] warfarin (COUMADIN) 5 MG tablet Take 1 tablet (5 mg total) by mouth daily. Resume on 3/29 30 tablet 0    No current facility-administered medications for this visit.     Physical Exam  Blood pressure 138/86, pulse 107, temperature 97.5 F (36.4 C), resp. rate 20, height 5\' 7"  (1.702 m), weight 250 lb (113.399 kg).  Constitutional: overall normal hygiene, normal nutrition, well developed, normal grooming, normal body habitus. Assistive device:cane  Musculoskeletal: gait and station Limp right, muscle tone and strength are normal, no tremors or atrophy is present.  .  Neurological: coordination overall normal.  Deep tendon reflex/nerve stretch intact.  Sensation normal.  Cranial nerves II-XII intact.   Skin:   normal overall no scars, lesions, ulcers or rashes. No psoriasis.  Psychiatric: Alert and oriented x 3.  Recent memory intact, remote memory unclear.  Normal mood and affect. Well groomed.  Good eye contact.  Cardiovascular: overall no swelling, no varicosities, no edema bilaterally, normal temperatures of the legs and arms, no clubbing, cyanosis and good capillary refill.  Lymphatic: palpation is normal. The right lower extremity is examined:  Inspection:  Thigh:  Non-tender and no defects  Knee has swelling 1+ effusion.                        Joint tenderness is present                        Patient is tender over the medial joint line  Lower Leg:  Has normal appearance and no tenderness or defects  Ankle:  Non-tender and no defects  Foot:  Non-tender and no defects Range of Motion:  Knee:  Range of motion is: 0 to 100                        Crepitus is  present  Ankle:  Range of motion is normal. Strength and Tone:  The right lower extremity has normal strength and tone. Stability:  Knee:  The knee is stable.  Ankle:  The ankle is stable.   Additional services performed: I have talked to her again about her smoking.  She says she is willing to quit but she is not cutting back.  She needs to do that first.  She is not checking her blood sugars daily.  She has  been seen recently by her doctor but she has not been told the results.  Her blood pressure is controlled she says.  She takes the medicine. She has no swelling of the legs.  The patient has been educated about the nature of the problem(s) and counseled on treatment options.  The patient appeared to understand what I have discussed and is in agreement with it.  Encounter Diagnosis  Name Primary?  . Left knee pain Yes    PLAN Call if any problems.  Precautions discussed.  Continue current medications.   Return to clinic one month

## 2016-03-05 NOTE — Patient Instructions (Signed)
Smoking Cessation, Tips for Success If you are ready to quit smoking, congratulations! You have chosen to help yourself be healthier. Cigarettes bring nicotine, tar, carbon monoxide, and other irritants into your body. Your lungs, heart, and blood vessels will be able to work better without these poisons. There are many different ways to quit smoking. Nicotine gum, nicotine patches, a nicotine inhaler, or nicotine nasal spray can help with physical craving. Hypnosis, support groups, and medicines help break the habit of smoking. WHAT THINGS CAN I DO TO MAKE QUITTING EASIER?  Here are some tips to help you quit for good:  Pick a date when you will quit smoking completely. Tell all of your friends and family about your plan to quit on that date.  Do not try to slowly cut down on the number of cigarettes you are smoking. Pick a quit date and quit smoking completely starting on that day.  Throw away all cigarettes.   Clean and remove all ashtrays from your home, work, and car.  On a card, write down your reasons for quitting. Carry the card with you and read it when you get the urge to smoke.  Cleanse your body of nicotine. Drink enough water and fluids to keep your urine clear or pale yellow. Do this after quitting to flush the nicotine from your body.  Learn to predict your moods. Do not let a bad situation be your excuse to have a cigarette. Some situations in your life might tempt you into wanting a cigarette.  Never have "just one" cigarette. It leads to wanting another and another. Remind yourself of your decision to quit.  Change habits associated with smoking. If you smoked while driving or when feeling stressed, try other activities to replace smoking. Stand up when drinking your coffee. Brush your teeth after eating. Sit in a different chair when you read the paper. Avoid alcohol while trying to quit, and try to drink fewer caffeinated beverages. Alcohol and caffeine may urge you to  smoke.  Avoid foods and drinks that can trigger a desire to smoke, such as sugary or spicy foods and alcohol.  Ask people who smoke not to smoke around you.  Have something planned to do right after eating or having a cup of coffee. For example, plan to take a walk or exercise.  Try a relaxation exercise to calm you down and decrease your stress. Remember, you may be tense and nervous for the first 2 weeks after you quit, but this will pass.  Find new activities to keep your hands busy. Play with a pen, coin, or rubber band. Doodle or draw things on paper.  Brush your teeth right after eating. This will help cut down on the craving for the taste of tobacco after meals. You can also try mouthwash.   Use oral substitutes in place of cigarettes. Try using lemon drops, carrots, cinnamon sticks, or chewing gum. Keep them handy so they are available when you have the urge to smoke.  When you have the urge to smoke, try deep breathing.  Designate your home as a nonsmoking area.  If you are a heavy smoker, ask your health care provider about a prescription for nicotine chewing gum. It can ease your withdrawal from nicotine.  Reward yourself. Set aside the cigarette money you save and buy yourself something nice.  Look for support from others. Join a support group or smoking cessation program. Ask someone at home or at work to help you with your plan   to quit smoking.  Always ask yourself, "Do I need this cigarette or is this just a reflex?" Tell yourself, "Today, I choose not to smoke," or "I do not want to smoke." You are reminding yourself of your decision to quit.  Do not replace cigarette smoking with electronic cigarettes (commonly called e-cigarettes). The safety of e-cigarettes is unknown, and some may contain harmful chemicals.  If you relapse, do not give up! Plan ahead and think about what you will do the next time you get the urge to smoke. HOW WILL I FEEL WHEN I QUIT SMOKING? You  may have symptoms of withdrawal because your body is used to nicotine (the addictive substance in cigarettes). You may crave cigarettes, be irritable, feel very hungry, cough often, get headaches, or have difficulty concentrating. The withdrawal symptoms are only temporary. They are strongest when you first quit but will go away within 10-14 days. When withdrawal symptoms occur, stay in control. Think about your reasons for quitting. Remind yourself that these are signs that your body is healing and getting used to being without cigarettes. Remember that withdrawal symptoms are easier to treat than the major diseases that smoking can cause.  Even after the withdrawal is over, expect periodic urges to smoke. However, these cravings are generally short lived and will go away whether you smoke or not. Do not smoke! WHAT RESOURCES ARE AVAILABLE TO HELP ME QUIT SMOKING? Your health care provider can direct you to community resources or hospitals for support, which may include:  Group support.  Education.  Hypnosis.  Therapy.   This information is not intended to replace advice given to you by your health care provider. Make sure you discuss any questions you have with your health care provider.   Document Released: 09/13/2004 Document Revised: 01/06/2015 Document Reviewed: 06/03/2013 Elsevier Interactive Patient Education 2016 [employer] Inc[employer]  

## 2016-03-07 DIAGNOSIS — Z720 Tobacco use: Secondary | ICD-10-CM | POA: Diagnosis not present

## 2016-03-07 DIAGNOSIS — I10 Essential (primary) hypertension: Secondary | ICD-10-CM | POA: Diagnosis not present

## 2016-03-07 DIAGNOSIS — J449 Chronic obstructive pulmonary disease, unspecified: Secondary | ICD-10-CM | POA: Diagnosis not present

## 2016-03-07 DIAGNOSIS — R05 Cough: Secondary | ICD-10-CM | POA: Diagnosis not present

## 2016-03-12 DIAGNOSIS — I80299 Phlebitis and thrombophlebitis of other deep vessels of unspecified lower extremity: Secondary | ICD-10-CM | POA: Diagnosis not present

## 2016-04-02 ENCOUNTER — Ambulatory Visit (INDEPENDENT_AMBULATORY_CARE_PROVIDER_SITE_OTHER): Payer: Medicare Other | Admitting: Orthopaedic Surgery

## 2016-04-02 ENCOUNTER — Encounter: Payer: Self-pay | Admitting: Orthopaedic Surgery

## 2016-04-02 VITALS — BP 142/88 | HR 97 | Temp 97.3°F | Resp 20 | Ht 67.0 in | Wt 248.0 lb

## 2016-04-02 DIAGNOSIS — M25561 Pain in right knee: Secondary | ICD-10-CM | POA: Diagnosis not present

## 2016-04-02 MED ORDER — HYDROCODONE-ACETAMINOPHEN 7.5-325 MG PO TABS: 1.0000 | ORAL_TABLET | ORAL | Status: DC | PRN

## 2016-04-02 NOTE — Progress Notes (Signed)
Patient Tracy [name], female DOB:12-Oct-1961, 55 y.o. Tracy [name]  Chief Complaint  Patient presents with  . Follow-up    follow up right knee    HPI  Tracy [name] is a 55 y.o. female who has chronic right knee pain. She has no giving way, no locking. She has swelling and popping  She has no redness or new trauma.   Knee Pain  The pain is present in the right knee. The quality of the pain is described as aching. The pain is at a severity of 3/10. The pain is mild. The pain has been fluctuating since onset. Associated symptoms include a loss of motion. Pertinent negatives include no loss of sensation, muscle weakness, numbness or tingling. The symptoms are aggravated by weight bearing. She has tried elevation, heat, ice, immobilization, non-weight bearing, NSAIDs and rest for the symptoms. The treatment provided moderate relief.    Body mass index is 38.83 kg/(m^2).  Review of Systems  Constitutional:       Patient has Diabetes Mellitus. Patient has hypertension. Patient has COPD or shortness of breath. Patient has BMI > 35. Patient has current smoking history.  HENT: Negative for congestion.   Respiratory: Positive for cough and shortness of breath.   Cardiovascular: Negative for chest pain.  Endocrine: Positive for cold intolerance.  Musculoskeletal: Positive for myalgias, joint swelling, arthralgias and gait problem.  Allergic/Immunologic: Positive for environmental allergies.  Neurological: Negative for tingling and numbness.    Past Medical History  Diagnosis Date  . Hypertension   . Myocardial infarct, old   . Tobacco abuse   . Crack cocaine use   . Acute ischemic stroke ([place]) 07/03/2012  . Hemiparesis, acute ([place]) 07/04/2012  . Thrombocytopenia ([place]) 07/03/2012  . LVH (left ventricular hypertrophy) 07/04/2012    Ejection fraction 60%.  . Diabetes ([place])   . Diabetes mellitus ([place])   . DVT (deep venous thrombosis) [facility])     Past Surgical History   Procedure Laterality Date  . Abdominal hysterectomy      No family history on file.  Social History Social History  Substance Use Topics  . Smoking status: Current Every Day Smoker -- 1.00 packs/day    Types: Cigarettes  . Smokeless tobacco: None  . Alcohol Use: No    No Known Allergies  Current Outpatient Prescriptions  Medication Sig Dispense Refill  . albuterol (PROVENTIL HFA;VENTOLIN HFA) 108 (90 BASE) MCG/ACT inhaler Inhale 2 puffs into the lungs every 4 (four) hours as needed for wheezing or shortness of breath. 1 Inhaler 0  . amLODipine (NORVASC) 10 MG tablet Take 1 tablet (10 mg total) by mouth daily. 30 tablet 1  . atorvastatin (LIPITOR) 10 MG tablet Take 10 mg by mouth at bedtime.    . Fluticasone-Salmeterol (ADVAIR) 250-50 MCG/DOSE AEPB Inhale 1 puff into the lungs 2 (two) times daily.    [name] gabapentin (NEURONTIN) 300 MG capsule Take 300 mg by mouth 3 (three) times daily.    [name] guaiFENesin (MUCINEX) 600 MG 12 hr tablet Take 2 tablets (1,200 mg total) by mouth 2 (two) times daily. 20 tablet 0  . hydrALAZINE (APRESOLINE) 25 MG tablet Take 1 tablet (25 mg total) by mouth every 8 (eight) hours. 90 tablet 1  . HYDROcodone-acetaminophen (NORCO) 7.5-325 MG tablet Take 1 tablet by mouth every 4 (four) hours as needed for moderate pain (Must last 30 days.  Do not drive or operate machinery while taking this medicine.). 120 tablet 0  . ipratropium-albuterol (DUONEB) 0.5-2.5 (3) MG/3ML SOLN Take  3 mLs by nebulization every 4 (four) hours as needed. 360 mL 1  . lisinopril-hydrochlorothiazide (PRINZIDE,ZESTORETIC) 20-12.5 MG per tablet Take 2 tablets by mouth daily.    . metFORMIN (GLUCOPHAGE-XR) 500 MG 24 hr tablet Take 500 mg by mouth daily.    . metoprolol tartrate (LOPRESSOR) 25 MG tablet Take 1 tablet (25 mg total) by mouth 2 (two) times daily. 60 tablet 1  . tiotropium (SPIRIVA) 18 MCG inhalation capsule Place 18 mcg into inhaler and inhale daily.    [name] warfarin (COUMADIN) 5 MG  tablet Take 1 tablet (5 mg total) by mouth daily. Resume on 3/29 30 tablet 0   No current facility-administered medications for this visit.     Physical Exam  Blood pressure 142/88, pulse 97, temperature 97.3 F (36.3 C), resp. rate 20, height 5\' 7"  (1.702 m), weight 248 lb (112.492 kg).  Constitutional: overall normal hygiene, normal nutrition, well developed, normal grooming, normal body habitus. Assistive device:none  Musculoskeletal: gait and station Limp right, muscle tone and strength are normal, no tremors or atrophy is present.  .  Neurological: coordination overall normal.  Deep tendon reflex/nerve stretch intact.  Sensation normal.  Cranial nerves II-XII intact.   Skin:   normal overall no scars, lesions, ulcers or rashes. No psoriasis.  Psychiatric: Alert and oriented x 3.  Recent memory intact, remote memory unclear.  Normal mood and affect. Well groomed.  Good eye contact.  Cardiovascular: overall no swelling, no varicosities, no edema bilaterally, normal temperatures of the legs and arms, no clubbing, cyanosis and good capillary refill.  Lymphatic: palpation is normal.  The right lower extremity is examined:  Inspection:  Thigh:  Non-tender and no defects  Knee has swelling 1+ effusion.                        Joint tenderness is present                        Patient is tender over the medial joint line  Lower Leg:  Has normal appearance and no tenderness or defects  Ankle:  Non-tender and no defects  Foot:  Non-tender and no defects Range of Motion:  Knee:  Range of motion is: 0-015                        Crepitus is  present  Ankle:  Range of motion is normal. Strength and Tone:  The right lower extremity has normal strength and tone. Stability:  Knee:  The knee is stable.  Ankle:  The ankle is stable.  The patient has been educated about the nature of the problem(s) and counseled on treatment options.  The patient appeared to understand what I have  discussed and is in agreement with it.  Encounter Diagnosis  Name Primary?  . Right knee pain Yes    PLAN Call if any problems.  Precautions discussed.  Continue current medications.   Return to clinic 2 months

## 2016-04-09 DIAGNOSIS — I80299 Phlebitis and thrombophlebitis of other deep vessels of unspecified lower extremity: Secondary | ICD-10-CM | POA: Diagnosis not present

## 2016-04-10 DIAGNOSIS — I739 Peripheral vascular disease, unspecified: Secondary | ICD-10-CM | POA: Diagnosis not present

## 2016-04-10 DIAGNOSIS — L84 Corns and callosities: Secondary | ICD-10-CM | POA: Diagnosis not present

## 2016-04-10 DIAGNOSIS — L603 Nail dystrophy: Secondary | ICD-10-CM | POA: Diagnosis not present

## 2016-04-10 DIAGNOSIS — E1151 Type 2 diabetes mellitus with diabetic peripheral angiopathy without gangrene: Secondary | ICD-10-CM | POA: Diagnosis not present

## 2016-04-23 DIAGNOSIS — I80299 Phlebitis and thrombophlebitis of other deep vessels of unspecified lower extremity: Secondary | ICD-10-CM | POA: Diagnosis not present

## 2016-05-02 ENCOUNTER — Telehealth: Payer: Self-pay | Admitting: Orthopaedic Surgery

## 2016-05-02 MED ORDER — HYDROCODONE-ACETAMINOPHEN 7.5-325 MG PO TABS: 1.0000 | ORAL_TABLET | ORAL | Status: DC | PRN

## 2016-05-02 NOTE — Telephone Encounter (Signed)
Patient requests refill on medication: HYDROcodone-acetaminophen (NORCO) 7.5-325 MG tablet [id number] - quantity 120.

## 2016-05-02 NOTE — Telephone Encounter (Signed)
Rx done. 

## 2016-05-14 DIAGNOSIS — J449 Chronic obstructive pulmonary disease, unspecified: Secondary | ICD-10-CM | POA: Diagnosis not present

## 2016-05-14 DIAGNOSIS — I80299 Phlebitis and thrombophlebitis of other deep vessels of unspecified lower extremity: Secondary | ICD-10-CM | POA: Diagnosis not present

## 2016-05-14 DIAGNOSIS — E119 Type 2 diabetes mellitus without complications: Secondary | ICD-10-CM | POA: Diagnosis not present

## 2016-05-14 DIAGNOSIS — I10 Essential (primary) hypertension: Secondary | ICD-10-CM | POA: Diagnosis not present

## 2016-05-14 DIAGNOSIS — F17200 Nicotine dependence, unspecified, uncomplicated: Secondary | ICD-10-CM | POA: Diagnosis not present

## 2016-05-14 DIAGNOSIS — Z7901 Long term (current) use of anticoagulants: Secondary | ICD-10-CM | POA: Diagnosis not present

## 2016-05-21 DIAGNOSIS — Z7901 Long term (current) use of anticoagulants: Secondary | ICD-10-CM | POA: Diagnosis not present

## 2016-05-21 DIAGNOSIS — E119 Type 2 diabetes mellitus without complications: Secondary | ICD-10-CM | POA: Diagnosis not present

## 2016-05-21 DIAGNOSIS — R600 Localized edema: Secondary | ICD-10-CM | POA: Diagnosis not present

## 2016-05-21 DIAGNOSIS — I80299 Phlebitis and thrombophlebitis of other deep vessels of unspecified lower extremity: Secondary | ICD-10-CM | POA: Diagnosis not present

## 2016-05-21 DIAGNOSIS — I10 Essential (primary) hypertension: Secondary | ICD-10-CM | POA: Diagnosis not present

## 2016-05-21 DIAGNOSIS — F17200 Nicotine dependence, unspecified, uncomplicated: Secondary | ICD-10-CM | POA: Diagnosis not present

## 2016-05-21 DIAGNOSIS — J449 Chronic obstructive pulmonary disease, unspecified: Secondary | ICD-10-CM | POA: Diagnosis not present

## 2016-05-28 DIAGNOSIS — H4423 Degenerative myopia, bilateral: Secondary | ICD-10-CM | POA: Diagnosis not present

## 2016-05-28 DIAGNOSIS — E119 Type 2 diabetes mellitus without complications: Secondary | ICD-10-CM | POA: Diagnosis not present

## 2016-05-28 DIAGNOSIS — H2513 Age-related nuclear cataract, bilateral: Secondary | ICD-10-CM | POA: Diagnosis not present

## 2016-06-04 ENCOUNTER — Encounter: Payer: Self-pay | Admitting: Orthopaedic Surgery

## 2016-06-04 ENCOUNTER — Ambulatory Visit (INDEPENDENT_AMBULATORY_CARE_PROVIDER_SITE_OTHER): Payer: Medicare Other | Admitting: Orthopaedic Surgery

## 2016-06-04 VITALS — BP 120/80 | HR 94 | Temp 97.5°F | Ht 65.0 in | Wt 249.0 lb

## 2016-06-04 DIAGNOSIS — M25561 Pain in right knee: Secondary | ICD-10-CM | POA: Diagnosis not present

## 2016-06-04 DIAGNOSIS — I10 Essential (primary) hypertension: Secondary | ICD-10-CM | POA: Diagnosis not present

## 2016-06-04 MED ORDER — HYDROCODONE-ACETAMINOPHEN 7.5-325 MG PO TABS
1.0000 | ORAL_TABLET | ORAL | Status: DC | PRN
Start: 2016-06-04 — End: 2016-07-04

## 2016-06-04 NOTE — Progress Notes (Signed)
Patient [id number] Tracy [name], female DOB:[birth year], 55 y.o. [id number]  Chief Complaint  Patient presents with  . Follow-up    Right knee    HPI  Tracy [name] is a 55 y.o. female who has chronic right knee pain. She has no new trauma.  She has no redness or giving way or locking.  She has swelling and popping. She is taking her medicine.  She is limited in what she can take medicine wise.  She is active.  Ice and rest help the most.  HPI  Body mass index is 41.44 kg/(m^2).  ROS  Review of Systems  Constitutional:       Patient has Diabetes Mellitus. Patient has hypertension. Patient has COPD or shortness of breath. Patient has BMI > 35. Patient has current smoking history.  HENT: Negative for congestion.   Respiratory: Positive for cough and shortness of breath.   Cardiovascular: Negative for chest pain.  Endocrine: Positive for cold intolerance.  Musculoskeletal: Positive for myalgias, joint swelling, arthralgias and gait problem.  Allergic/Immunologic: Positive for environmental allergies.  Neurological: Negative for numbness.    Past Medical History  Diagnosis Date  . Hypertension   . Myocardial infarct, old   . Tobacco abuse   . Crack cocaine use   . Acute ischemic stroke ([place]) 07/03/2012  . Hemiparesis, acute ([place]) 07/04/2012  . Thrombocytopenia ([place]) 07/03/2012  . LVH (left ventricular hypertrophy) 07/04/2012    Ejection fraction 60%.  . Diabetes ([place])   . Diabetes mellitus ([place])   . DVT (deep venous thrombosis) [facility])     Past Surgical History  Procedure Laterality Date  . Abdominal hysterectomy      History reviewed. No pertinent family history.  Social History Social History  Substance Use Topics  . Smoking status: Current Every Day Smoker -- 1.00 packs/day    Types: Cigarettes  . Smokeless tobacco: None  . Alcohol Use: No    No Known Allergies  Current Outpatient Prescriptions  Medication Sig Dispense Refill  . albuterol (PROVENTIL  HFA;VENTOLIN HFA) 108 (90 BASE) MCG/ACT inhaler Inhale 2 puffs into the lungs every 4 (four) hours as needed for wheezing or shortness of breath. 1 Inhaler 0  . amLODipine (NORVASC) 10 MG tablet Take 1 tablet (10 mg total) by mouth daily. 30 tablet 1  . atorvastatin (LIPITOR) 10 MG tablet Take 10 mg by mouth at bedtime.    . Fluticasone-Salmeterol (ADVAIR) 250-50 MCG/DOSE AEPB Inhale 1 puff into the lungs 2 (two) times daily.    [name] gabapentin (NEURONTIN) 300 MG capsule Take 300 mg by mouth 3 (three) times daily.    [name] guaiFENesin (MUCINEX) 600 MG 12 hr tablet Take 2 tablets (1,200 mg total) by mouth 2 (two) times daily. 20 tablet 0  . hydrALAZINE (APRESOLINE) 25 MG tablet Take 1 tablet (25 mg total) by mouth every 8 (eight) hours. 90 tablet 1  . HYDROcodone-acetaminophen (NORCO) 7.5-325 MG tablet Take 1 tablet by mouth every 4 (four) hours as needed for moderate pain (Must last 30 days.  Do not drive or operate machinery while taking this medicine.). 120 tablet 0  . ipratropium-albuterol (DUONEB) 0.5-2.5 (3) MG/3ML SOLN Take 3 mLs by nebulization every 4 (four) hours as needed. 360 mL 1  . lisinopril-hydrochlorothiazide (PRINZIDE,ZESTORETIC) 20-12.5 MG per tablet Take 2 tablets by mouth daily.    . metFORMIN (GLUCOPHAGE-XR) 500 MG 24 hr tablet Take 500 mg by mouth daily.    . metoprolol tartrate (LOPRESSOR) 25 MG tablet Take 1 tablet (25 mg total)  by mouth 2 (two) times daily. 60 tablet 1  . tiotropium (SPIRIVA) 18 MCG inhalation capsule Place 18 mcg into inhaler and inhale daily.    [name] warfarin (COUMADIN) 5 MG tablet Take 1 tablet (5 mg total) by mouth daily. Resume on 3/29 30 tablet 0   No current facility-administered medications for this visit.     Physical Exam  Blood pressure 120/80, pulse 94, temperature 97.5 F (36.4 C), height 5\' 5"  (1.651 m), weight 249 lb (112.946 kg).  Constitutional: overall normal hygiene, normal nutrition, well developed, normal grooming, normal body  habitus. Assistive device:none  Musculoskeletal: gait and station Limp right, muscle tone and strength are normal, no tremors or atrophy is present.  .  Neurological: coordination overall normal.  Deep tendon reflex/nerve stretch intact.  Sensation normal.  Cranial nerves II-XII intact.   Skin:   normal overall no scars, lesions, ulcers or rashes. No psoriasis.  Psychiatric: Alert and oriented x 3.  Recent memory intact, remote memory unclear.  Normal mood and affect. Well groomed.  Good eye contact.  Cardiovascular: overall no swelling, no varicosities, no edema bilaterally, normal temperatures of the legs and arms, no clubbing, cyanosis and good capillary refill.  Lymphatic: palpation is normal.  The right lower extremity is examined:  Inspection:  Thigh:  Non-tender and no defects  Knee has swelling 2+ effusion.                        Joint tenderness is present                        Patient is tender over the medial joint line  Lower Leg:  Has normal appearance and no tenderness or defects  Ankle:  Non-tender and no defects  Foot:  Non-tender and no defects Range of Motion:  Knee:  Range of motion is: 0-105                        Crepitus is  present  Ankle:  Range of motion is normal. Strength and Tone:  The right lower extremity has normal strength and tone. Stability:  Knee:  The knee is stable.  Ankle:  The ankle is stable.     The patient has been educated about the nature of the problem(s) and counseled on treatment options.  The patient appeared to understand what I have discussed and is in agreement with it.  Encounter Diagnoses  Name Primary?  . Right knee pain Yes  . Essential hypertension     PLAN Call if any problems.  Precautions discussed.  Continue current medications.   Return to clinic 2 months  Electronically Signed Sanjuana Kava, MD 6/6/201710:57 AM

## 2016-06-06 DIAGNOSIS — J449 Chronic obstructive pulmonary disease, unspecified: Secondary | ICD-10-CM | POA: Diagnosis not present

## 2016-06-06 DIAGNOSIS — I10 Essential (primary) hypertension: Secondary | ICD-10-CM | POA: Diagnosis not present

## 2016-06-06 DIAGNOSIS — R0602 Shortness of breath: Secondary | ICD-10-CM | POA: Diagnosis not present

## 2016-06-06 DIAGNOSIS — E6609 Other obesity due to excess calories: Secondary | ICD-10-CM | POA: Diagnosis not present

## 2016-06-17 DIAGNOSIS — H35031 Hypertensive retinopathy, right eye: Secondary | ICD-10-CM | POA: Diagnosis not present

## 2016-06-17 DIAGNOSIS — H35412 Lattice degeneration of retina, left eye: Secondary | ICD-10-CM | POA: Diagnosis not present

## 2016-06-17 DIAGNOSIS — H35032 Hypertensive retinopathy, left eye: Secondary | ICD-10-CM | POA: Diagnosis not present

## 2016-06-17 DIAGNOSIS — H40023 Open angle with borderline findings, high risk, bilateral: Secondary | ICD-10-CM | POA: Diagnosis not present

## 2016-06-21 DIAGNOSIS — I80299 Phlebitis and thrombophlebitis of other deep vessels of unspecified lower extremity: Secondary | ICD-10-CM | POA: Diagnosis not present

## 2016-07-01 DIAGNOSIS — I739 Peripheral vascular disease, unspecified: Secondary | ICD-10-CM | POA: Diagnosis not present

## 2016-07-01 DIAGNOSIS — L603 Nail dystrophy: Secondary | ICD-10-CM | POA: Diagnosis not present

## 2016-07-01 DIAGNOSIS — E1151 Type 2 diabetes mellitus with diabetic peripheral angiopathy without gangrene: Secondary | ICD-10-CM | POA: Diagnosis not present

## 2016-07-04 ENCOUNTER — Telehealth: Payer: Self-pay | Admitting: *Deleted

## 2016-07-04 MED ORDER — HYDROCODONE-ACETAMINOPHEN 7.5-325 MG PO TABS: 1.0000 | ORAL_TABLET | ORAL | Status: DC | PRN

## 2016-07-04 NOTE — Telephone Encounter (Signed)
Patient Request Refill : HYDROcodone-acetaminophen (NORCO) 7.5-325 MG tablet [id number]  120Tabs

## 2016-07-04 NOTE — Telephone Encounter (Signed)
Rx done. 

## 2016-07-15 DIAGNOSIS — G609 Hereditary and idiopathic neuropathy, unspecified: Secondary | ICD-10-CM | POA: Diagnosis not present

## 2016-07-15 DIAGNOSIS — M792 Neuralgia and neuritis, unspecified: Secondary | ICD-10-CM | POA: Diagnosis not present

## 2016-07-23 DIAGNOSIS — I80299 Phlebitis and thrombophlebitis of other deep vessels of unspecified lower extremity: Secondary | ICD-10-CM | POA: Diagnosis not present

## 2016-07-23 DIAGNOSIS — Z7901 Long term (current) use of anticoagulants: Secondary | ICD-10-CM | POA: Diagnosis not present

## 2016-07-26 ENCOUNTER — Other Ambulatory Visit (HOSPITAL_COMMUNITY): Payer: Self-pay | Admitting: Pulmonary Disease

## 2016-07-26 ENCOUNTER — Ambulatory Visit (HOSPITAL_COMMUNITY)
Admission: RE | Admit: 2016-07-26 | Discharge: 2016-07-26 | Disposition: A | Discharge status: Home / Self Care | Payer: Medicare Other | Source: Ambulatory Visit | Attending: Pulmonary Disease | Admitting: Pulmonary Disease

## 2016-07-26 DIAGNOSIS — I517 Cardiomegaly: Secondary | ICD-10-CM | POA: Insufficient documentation

## 2016-07-26 DIAGNOSIS — R0602 Shortness of breath: Secondary | ICD-10-CM

## 2016-07-26 DIAGNOSIS — J9811 Atelectasis: Secondary | ICD-10-CM | POA: Diagnosis not present

## 2016-07-29 DIAGNOSIS — M792 Neuralgia and neuritis, unspecified: Secondary | ICD-10-CM | POA: Diagnosis not present

## 2016-08-06 ENCOUNTER — Encounter: Payer: Self-pay | Admitting: Orthopaedic Surgery

## 2016-08-06 ENCOUNTER — Ambulatory Visit (INDEPENDENT_AMBULATORY_CARE_PROVIDER_SITE_OTHER): Payer: Medicare Other | Admitting: Orthopaedic Surgery

## 2016-08-06 VITALS — BP 142/84 | HR 88 | Temp 97.7°F | Ht 65.0 in | Wt 246.0 lb

## 2016-08-06 DIAGNOSIS — I10 Essential (primary) hypertension: Secondary | ICD-10-CM

## 2016-08-06 DIAGNOSIS — M25561 Pain in right knee: Secondary | ICD-10-CM | POA: Diagnosis not present

## 2016-08-06 MED ORDER — HYDROCODONE-ACETAMINOPHEN 7.5-325 MG PO TABS: 1.0000 | ORAL_TABLET | ORAL | 0 refills | Status: DC | PRN

## 2016-08-06 NOTE — Progress Notes (Signed)
Patient [id number] Tracy [name], female DOB:11/08/1961, 55 y.o. [id number]  Chief Complaint  Patient presents with  . Follow-up    Right knee pain    HPI  Tracy [name] is a 55 y.o. female who has chronic right knee pain. She is on coumadin and has diabetes.  She has no locking, no giving way.  She has swelling and popping.  Pain medicine helps.  She has no new trauma. HPI  Body mass index is 40.94 kg/m.  ROS  Review of Systems  Constitutional:       Patient has Diabetes Mellitus. Patient has hypertension. Patient has COPD or shortness of breath. Patient has BMI > 35. Patient has current smoking history.  HENT: Negative for congestion.   Respiratory: Positive for cough and shortness of breath.   Cardiovascular: Negative for chest pain.  Endocrine: Positive for cold intolerance.  Musculoskeletal: Positive for arthralgias, gait problem, joint swelling and myalgias.  Allergic/Immunologic: Positive for environmental allergies.  Neurological: Negative for numbness.    Past Medical History:  Diagnosis Date  . Acute ischemic stroke ([place]) 07/03/2012  . Crack cocaine use   . Diabetes ([place])   . Diabetes mellitus ([place])   . DVT (deep venous thrombosis) ([place])   . Hemiparesis, acute ([place]) 07/04/2012  . Hypertension   . LVH (left ventricular hypertrophy) 07/04/2012   Ejection fraction 60%.  . Myocardial infarct, old   . Thrombocytopenia ([place]) 07/03/2012  . Tobacco abuse     Past Surgical History:  Procedure Laterality Date  . ABDOMINAL HYSTERECTOMY      History reviewed. No pertinent family history.  Social History Social History  Substance Use Topics  . Smoking status: Current Every Day Smoker    Packs/day: 1.00    Types: Cigarettes  . Smokeless tobacco: Never Used  . Alcohol use No    No Known Allergies  Current Outpatient Prescriptions  Medication Sig Dispense Refill  . albuterol (PROVENTIL HFA;VENTOLIN HFA) 108 (90 BASE) MCG/ACT inhaler Inhale 2 puffs into  the lungs every 4 (four) hours as needed for wheezing or shortness of breath. 1 Inhaler 0  . amLODipine (NORVASC) 10 MG tablet Take 1 tablet (10 mg total) by mouth daily. 30 tablet 1  . atorvastatin (LIPITOR) 10 MG tablet Take 10 mg by mouth at bedtime.    . Fluticasone-Salmeterol (ADVAIR) 250-50 MCG/DOSE AEPB Inhale 1 puff into the lungs 2 (two) times daily.    [name] gabapentin (NEURONTIN) 300 MG capsule Take 300 mg by mouth 3 (three) times daily.    [name] guaiFENesin (MUCINEX) 600 MG 12 hr tablet Take 2 tablets (1,200 mg total) by mouth 2 (two) times daily. 20 tablet 0  . hydrALAZINE (APRESOLINE) 25 MG tablet Take 1 tablet (25 mg total) by mouth every 8 (eight) hours. 90 tablet 1  . HYDROcodone-acetaminophen (NORCO) 7.5-325 MG tablet Take 1 tablet by mouth every 4 (four) hours as needed for moderate pain (Must last 30 days.  Do not drive or operate machinery while taking this medicine.). 120 tablet 0  . ipratropium-albuterol (DUONEB) 0.5-2.5 (3) MG/3ML SOLN Take 3 mLs by nebulization every 4 (four) hours as needed. 360 mL 1  . lisinopril-hydrochlorothiazide (PRINZIDE,ZESTORETIC) 20-12.5 MG per tablet Take 2 tablets by mouth daily.    . metFORMIN (GLUCOPHAGE-XR) 500 MG 24 hr tablet Take 500 mg by mouth daily.    . metoprolol tartrate (LOPRESSOR) 25 MG tablet Take 1 tablet (25 mg total) by mouth 2 (two) times daily. 60 tablet 1  . tiotropium (SPIRIVA) 18 MCG  inhalation capsule Place 18 mcg into inhaler and inhale daily.    [name] warfarin (COUMADIN) 5 MG tablet Take 1 tablet (5 mg total) by mouth daily. Resume on 3/29 30 tablet 0   No current facility-administered medications for this visit.      Physical Exam  Blood pressure (!) 142/84, pulse 88, temperature 97.7 F (36.5 C), height 5\' 5"  (1.651 m), weight 246 lb (111.6 kg).  Constitutional: overall normal hygiene, normal nutrition, well developed, normal grooming, normal body habitus. Assistive device:cane  Musculoskeletal: gait and station Limp  right, muscle tone and strength are normal, no tremors or atrophy is present.  .  Neurological: coordination overall normal.  Deep tendon reflex/nerve stretch intact.  Sensation normal.  Cranial nerves II-XII intact.   Skin:   normal overall no scars, lesions, ulcers or rashes. No psoriasis.  Psychiatric: Alert and oriented x 3.  Recent memory intact, remote memory unclear.  Normal mood and affect. Well groomed.  Good eye contact.  Cardiovascular: overall no swelling, no varicosities, no edema bilaterally, normal temperatures of the legs and arms, no clubbing, cyanosis and good capillary refill.  Lymphatic: palpation is normal.  The right lower extremity is examined:  Inspection:  Thigh:  Non-tender and no defects  Knee has swelling 1+ effusion.                        Joint tenderness is present                        Patient is tender over the medial joint line  Lower Leg:  Has normal appearance and no tenderness or defects  Ankle:  Non-tender and no defects  Foot:  Non-tender and no defects Range of Motion:  Knee:  Range of motion is: 0-100                        Crepitus is  present  Ankle:  Range of motion is normal. Strength and Tone:  The right lower extremity has normal strength and tone. Stability:  Knee:  The knee is stable.  Ankle:  The ankle is stable.    The patient has been educated about the nature of the problem(s) and counseled on treatment options.  The patient appeared to understand what I have discussed and is in agreement with it.  Encounter Diagnoses  Name Primary?  . Right knee pain Yes  . Essential hypertension     PLAN Call if any problems.  Precautions discussed.  Continue current medications.   Return to clinic 2 months   Electronically Signed Sanjuana Kava, MD 8/8/201710:46 AM

## 2016-08-27 DIAGNOSIS — I80299 Phlebitis and thrombophlebitis of other deep vessels of unspecified lower extremity: Secondary | ICD-10-CM | POA: Diagnosis not present

## 2016-08-27 DIAGNOSIS — E119 Type 2 diabetes mellitus without complications: Secondary | ICD-10-CM | POA: Diagnosis not present

## 2016-08-27 DIAGNOSIS — F17200 Nicotine dependence, unspecified, uncomplicated: Secondary | ICD-10-CM | POA: Diagnosis not present

## 2016-08-27 DIAGNOSIS — Z7901 Long term (current) use of anticoagulants: Secondary | ICD-10-CM | POA: Diagnosis not present

## 2016-08-27 DIAGNOSIS — I10 Essential (primary) hypertension: Secondary | ICD-10-CM | POA: Diagnosis not present

## 2016-08-27 DIAGNOSIS — J449 Chronic obstructive pulmonary disease, unspecified: Secondary | ICD-10-CM | POA: Diagnosis not present

## 2016-08-28 ENCOUNTER — Other Ambulatory Visit (HOSPITAL_COMMUNITY): Payer: Self-pay | Admitting: Physician Assistant

## 2016-08-28 DIAGNOSIS — Z1231 Encounter for screening mammogram for malignant neoplasm of breast: Secondary | ICD-10-CM

## 2016-09-04 ENCOUNTER — Telehealth: Payer: Self-pay | Admitting: Orthopaedic Surgery

## 2016-09-04 MED ORDER — HYDROCODONE-ACETAMINOPHEN 7.5-325 MG PO TABS: 1.0000 | ORAL_TABLET | ORAL | 0 refills | Status: DC | PRN

## 2016-09-04 NOTE — Telephone Encounter (Signed)
Hydrocodone-Acetaminophen 7.5/325mg Qty 120 Tablets °

## 2016-09-04 NOTE — Telephone Encounter (Signed)
Patient called for refill:  HYDROcodone-acetaminophen (NORCO) 7.5-325 MG tablet 120 tablet   [employer] primary, Medicaid secondary)

## 2016-09-05 DIAGNOSIS — E782 Mixed hyperlipidemia: Secondary | ICD-10-CM | POA: Diagnosis not present

## 2016-09-05 DIAGNOSIS — I10 Essential (primary) hypertension: Secondary | ICD-10-CM | POA: Diagnosis not present

## 2016-09-05 DIAGNOSIS — E1165 Type 2 diabetes mellitus with hyperglycemia: Secondary | ICD-10-CM | POA: Diagnosis not present

## 2016-09-05 DIAGNOSIS — Z86718 Personal history of other venous thrombosis and embolism: Secondary | ICD-10-CM | POA: Diagnosis not present

## 2016-09-05 DIAGNOSIS — Z720 Tobacco use: Secondary | ICD-10-CM | POA: Diagnosis not present

## 2016-09-05 DIAGNOSIS — K429 Umbilical hernia without obstruction or gangrene: Secondary | ICD-10-CM | POA: Diagnosis not present

## 2016-09-05 DIAGNOSIS — J449 Chronic obstructive pulmonary disease, unspecified: Secondary | ICD-10-CM | POA: Diagnosis not present

## 2016-09-16 ENCOUNTER — Ambulatory Visit (HOSPITAL_COMMUNITY)
Admission: RE | Admit: 2016-09-16 | Discharge: 2016-09-16 | Disposition: A | Discharge status: Home / Self Care | Payer: Medicare Other | Source: Ambulatory Visit | Attending: Physician Assistant | Admitting: Physician Assistant

## 2016-09-16 DIAGNOSIS — Z1231 Encounter for screening mammogram for malignant neoplasm of breast: Secondary | ICD-10-CM | POA: Diagnosis not present

## 2016-09-27 DIAGNOSIS — I80299 Phlebitis and thrombophlebitis of other deep vessels of unspecified lower extremity: Secondary | ICD-10-CM | POA: Diagnosis not present

## 2016-10-07 DIAGNOSIS — E1151 Type 2 diabetes mellitus with diabetic peripheral angiopathy without gangrene: Secondary | ICD-10-CM | POA: Diagnosis not present

## 2016-10-07 DIAGNOSIS — I739 Peripheral vascular disease, unspecified: Secondary | ICD-10-CM | POA: Diagnosis not present

## 2016-10-08 ENCOUNTER — Ambulatory Visit (INDEPENDENT_AMBULATORY_CARE_PROVIDER_SITE_OTHER): Payer: Medicare Other | Admitting: Orthopaedic Surgery

## 2016-10-08 ENCOUNTER — Encounter: Payer: Self-pay | Admitting: Orthopaedic Surgery

## 2016-10-08 VITALS — BP 129/78 | HR 76 | Temp 97.0°F | Ht 66.0 in | Wt 247.0 lb

## 2016-10-08 DIAGNOSIS — M25561 Pain in right knee: Secondary | ICD-10-CM

## 2016-10-08 DIAGNOSIS — F17210 Nicotine dependence, cigarettes, uncomplicated: Secondary | ICD-10-CM

## 2016-10-08 DIAGNOSIS — I10 Essential (primary) hypertension: Secondary | ICD-10-CM

## 2016-10-08 DIAGNOSIS — G8929 Other chronic pain: Secondary | ICD-10-CM

## 2016-10-08 MED ORDER — HYDROCODONE-ACETAMINOPHEN 7.5-325 MG PO TABS: 1.0000 | ORAL_TABLET | ORAL | 0 refills | Status: DC | PRN

## 2016-10-08 NOTE — Patient Instructions (Signed)
Smoking Cessation, Tips for Success If you are ready to quit smoking, congratulations! You have chosen to help yourself be healthier. Cigarettes bring nicotine, tar, carbon monoxide, and other irritants into your body. Your lungs, heart, and blood vessels will be able to work better without these poisons. There are many different ways to quit smoking. Nicotine gum, nicotine patches, a nicotine inhaler, or nicotine nasal spray can help with physical craving. Hypnosis, support groups, and medicines help break the habit of smoking. WHAT THINGS CAN I DO TO MAKE QUITTING EASIER?  Here are some tips to help you quit for good:  Pick a date when you will quit smoking completely. Tell all of your friends and family about your plan to quit on that date.  Do not try to slowly cut down on the number of cigarettes you are smoking. Pick a quit date and quit smoking completely starting on that day.  Throw away all cigarettes.   Clean and remove all ashtrays from your home, work, and car.  On a card, write down your reasons for quitting. Carry the card with you and read it when you get the urge to smoke.  Cleanse your body of nicotine. Drink enough water and fluids to keep your urine clear or pale yellow. Do this after quitting to flush the nicotine from your body.  Learn to predict your moods. Do not let a bad situation be your excuse to have a cigarette. Some situations in your life might tempt you into wanting a cigarette.  Never have "just one" cigarette. It leads to wanting another and another. Remind yourself of your decision to quit.  Change habits associated with smoking. If you smoked while driving or when feeling stressed, try other activities to replace smoking. Stand up when drinking your coffee. Brush your teeth after eating. Sit in a different chair when you read the paper. Avoid alcohol while trying to quit, and try to drink fewer caffeinated beverages. Alcohol and caffeine may urge you to  smoke.  Avoid foods and drinks that can trigger a desire to smoke, such as sugary or spicy foods and alcohol.  Ask people who smoke not to smoke around you.  Have something planned to do right after eating or having a cup of coffee. For example, plan to take a walk or exercise.  Try a relaxation exercise to calm you down and decrease your stress. Remember, you may be tense and nervous for the first 2 weeks after you quit, but this will pass.  Find new activities to keep your hands busy. Play with a pen, coin, or rubber band. Doodle or draw things on paper.  Brush your teeth right after eating. This will help cut down on the craving for the taste of tobacco after meals. You can also try mouthwash.   Use oral substitutes in place of cigarettes. Try using lemon drops, carrots, cinnamon sticks, or chewing gum. Keep them handy so they are available when you have the urge to smoke.  When you have the urge to smoke, try deep breathing.  Designate your home as a nonsmoking area.  If you are a heavy smoker, ask your health care provider about a prescription for nicotine chewing gum. It can ease your withdrawal from nicotine.  Reward yourself. Set aside the cigarette money you save and buy yourself something nice.  Look for support from others. Join a support group or smoking cessation program. Ask someone at home or at work to help you with your plan   to quit smoking.  Always ask yourself, "Do I need this cigarette or is this just a reflex?" Tell yourself, "Today, I choose not to smoke," or "I do not want to smoke." You are reminding yourself of your decision to quit.  Do not replace cigarette smoking with electronic cigarettes (commonly called e-cigarettes). The safety of e-cigarettes is unknown, and some may contain harmful chemicals.  If you relapse, do not give up! Plan ahead and think about what you will do the next time you get the urge to smoke. HOW WILL I FEEL WHEN I QUIT SMOKING? You  may have symptoms of withdrawal because your body is used to nicotine (the addictive substance in cigarettes). You may crave cigarettes, be irritable, feel very hungry, cough often, get headaches, or have difficulty concentrating. The withdrawal symptoms are only temporary. They are strongest when you first quit but will go away within 10-14 days. When withdrawal symptoms occur, stay in control. Think about your reasons for quitting. Remind yourself that these are signs that your body is healing and getting used to being without cigarettes. Remember that withdrawal symptoms are easier to treat than the major diseases that smoking can cause.  Even after the withdrawal is over, expect periodic urges to smoke. However, these cravings are generally short lived and will go away whether you smoke or not. Do not smoke! WHAT RESOURCES ARE AVAILABLE TO HELP ME QUIT SMOKING? Your health care provider can direct you to community resources or hospitals for support, which may include:  Group support.  Education.  Hypnosis.  Therapy.   This information is not intended to replace advice given to you by your health care provider. Make sure you discuss any questions you have with your health care provider.   Document Released: 09/13/2004 Document Revised: 01/06/2015 Document Reviewed: 06/03/2013 Elsevier Interactive Patient Education 2016 [employer] Inc[employer]  

## 2016-10-08 NOTE — Progress Notes (Signed)
Patient Tracy [name], female DOB:[birth year], 55 y.o. [id number]  Chief Complaint  Patient presents with  . Follow-up    right knee pain    HPI  Tracy [name] is a 55 y.o. female who has right knee pian.  She is doing well. She has no giving way or new trauma.  She has swelling and popping.  She is taking her medicine.  She is willing to stop smoking.  I will have her see her family doctor for patches. HPI  Body mass index is 39.87 kg/m.  ROS  Review of Systems  Constitutional:       Patient has Diabetes Mellitus. Patient has hypertension. Patient has COPD or shortness of breath. Patient has BMI > 35. Patient has current smoking history.  HENT: Negative for congestion.   Respiratory: Positive for cough and shortness of breath.   Cardiovascular: Negative for chest pain.  Endocrine: Positive for cold intolerance.  Musculoskeletal: Positive for arthralgias, gait problem, joint swelling and myalgias.  Allergic/Immunologic: Positive for environmental allergies.  Neurological: Negative for numbness.    Past Medical History:  Diagnosis Date  . Acute ischemic stroke ([place]) 07/03/2012  . Crack cocaine use   . Diabetes ([place])   . Diabetes mellitus ([place])   . DVT (deep venous thrombosis) ([place])   . Hemiparesis, acute ([place]) 07/04/2012  . Hypertension   . LVH (left ventricular hypertrophy) 07/04/2012   Ejection fraction 60%.  . Myocardial infarct, old   . Thrombocytopenia ([place]) 07/03/2012  . Tobacco abuse     Past Surgical History:  Procedure Laterality Date  . ABDOMINAL HYSTERECTOMY      No family history on file.  Social History Social History  Substance Use Topics  . Smoking status: Current Every Day Smoker    Packs/day: 1.00    Types: Cigarettes  . Smokeless tobacco: Never Used  . Alcohol use No    No Known Allergies  Current Outpatient Prescriptions  Medication Sig Dispense Refill  . albuterol (PROVENTIL HFA;VENTOLIN HFA) 108 (90 BASE) MCG/ACT  inhaler Inhale 2 puffs into the lungs every 4 (four) hours as needed for wheezing or shortness of breath. 1 Inhaler 0  . amLODipine (NORVASC) 10 MG tablet Take 1 tablet (10 mg total) by mouth daily. 30 tablet 1  . atorvastatin (LIPITOR) 10 MG tablet Take 10 mg by mouth at bedtime.    . Fluticasone-Salmeterol (ADVAIR) 250-50 MCG/DOSE AEPB Inhale 1 puff into the lungs 2 (two) times daily.    [name] gabapentin (NEURONTIN) 300 MG capsule Take 300 mg by mouth 3 (three) times daily.    [name] guaiFENesin (MUCINEX) 600 MG 12 hr tablet Take 2 tablets (1,200 mg total) by mouth 2 (two) times daily. 20 tablet 0  . hydrALAZINE (APRESOLINE) 25 MG tablet Take 1 tablet (25 mg total) by mouth every 8 (eight) hours. 90 tablet 1  . HYDROcodone-acetaminophen (NORCO) 7.5-325 MG tablet Take 1 tablet by mouth every 4 (four) hours as needed for moderate pain (Must last 30 days.  Do not drive or operate machinery while taking this medicine.). 110 tablet 0  . ipratropium-albuterol (DUONEB) 0.5-2.5 (3) MG/3ML SOLN Take 3 mLs by nebulization every 4 (four) hours as needed. 360 mL 1  . lisinopril-hydrochlorothiazide (PRINZIDE,ZESTORETIC) 20-12.5 MG per tablet Take 2 tablets by mouth daily.    . metFORMIN (GLUCOPHAGE-XR) 500 MG 24 hr tablet Take 500 mg by mouth daily.    . metoprolol tartrate (LOPRESSOR) 25 MG tablet Take 1 tablet (25 mg total) by mouth 2 (two) times  daily. 60 tablet 1  . tiotropium (SPIRIVA) 18 MCG inhalation capsule Place 18 mcg into inhaler and inhale daily.    [name] warfarin (COUMADIN) 5 MG tablet Take 1 tablet (5 mg total) by mouth daily. Resume on 3/29 30 tablet 0   No current facility-administered medications for this visit.      Physical Exam  Blood pressure 129/78, pulse 76, temperature 97 F (36.1 C), height 5\' 6"  (1.676 m), weight 247 lb (112 kg).  Constitutional: overall normal hygiene, normal nutrition, well developed, normal grooming, normal body habitus. Assistive device:none  Musculoskeletal: gait  and station Limp right, muscle tone and strength are normal, no tremors or atrophy is present.  .  Neurological: coordination overall normal.  Deep tendon reflex/nerve stretch intact.  Sensation normal.  Cranial nerves II-XII intact.   Skin:   Normal overall no scars, lesions, ulcers or rashes. No psoriasis.  Psychiatric: Alert and oriented x 3.  Recent memory intact, remote memory unclear.  Normal mood and affect. Well groomed.  Good eye contact.  Cardiovascular: overall no swelling, no varicosities, no edema bilaterally, normal temperatures of the legs and arms, no clubbing, cyanosis and good capillary refill.  Lymphatic: palpation is normal.  The right lower extremity is examined:  Inspection:  Thigh:  Non-tender and no defects  Knee has swelling 1+ effusion.                        Joint tenderness is present                        Patient is tender over the medial joint line  Lower Leg:  Has normal appearance and no tenderness or defects  Ankle:  Non-tender and no defects  Foot:  Non-tender and no defects Range of Motion:  Knee:  Range of motion is: 0-105                        Crepitus is  present  Ankle:  Range of motion is normal. Strength and Tone:  The right lower extremity has normal strength and tone. Stability:  Knee:  The knee is stable.  Ankle:  The ankle is stable.    The patient has been educated about the nature of the problem(s) and counseled on treatment options.  The patient appeared to understand what I have discussed and is in agreement with it.  Encounter Diagnoses  Name Primary?  . Chronic pain of right knee Yes  . Essential hypertension   . Cigarette nicotine dependence without complication     PLAN Call if any problems.  Precautions discussed.  Continue current medications.   Return to clinic 2 months   Electronically Signed Sanjuana Kava, MD 10/10/201710:09 AM

## 2016-10-25 DIAGNOSIS — Z7901 Long term (current) use of anticoagulants: Secondary | ICD-10-CM | POA: Diagnosis not present

## 2016-10-29 LAB — BLOOD GAS, ARTERIAL
ACID-BASE EXCESS: 0.8 mmol/L (ref 0.0–2.0)
Bicarbonate: 23.4 mEq/L (ref 20.0–24.0)
DRAWN BY: 22223
O2 Content: 4 L/min
O2 SAT: 94.1 %
PATIENT TEMPERATURE: 37
PCO2 ART: 38.5 mmHg (ref 35.0–45.0)
TCO2: 20.3 mmol/L (ref 0–100)
pH, Arterial: 7.401 (ref 7.350–7.450)
pO2, Arterial: 77.3 mmHg — ABNORMAL LOW (ref 80.0–100.0)

## 2016-11-05 ENCOUNTER — Telehealth: Payer: Self-pay | Admitting: Orthopaedic Surgery

## 2016-11-05 MED ORDER — HYDROCODONE-ACETAMINOPHEN 7.5-325 MG PO TABS: 1.0000 | ORAL_TABLET | ORAL | 0 refills | Status: DC | PRN

## 2016-11-05 NOTE — Telephone Encounter (Signed)
Patient called for refill:  HYDROcodone-acetaminophen (NORCO) 7.5-325 MG tablet 110 tablet   - insurance [facility] Medicare + Medicaid

## 2016-11-26 DIAGNOSIS — Z7901 Long term (current) use of anticoagulants: Secondary | ICD-10-CM | POA: Diagnosis not present

## 2016-12-10 ENCOUNTER — Ambulatory Visit (INDEPENDENT_AMBULATORY_CARE_PROVIDER_SITE_OTHER): Payer: Medicare Other | Admitting: Orthopaedic Surgery

## 2016-12-10 ENCOUNTER — Encounter: Payer: Self-pay | Admitting: Orthopaedic Surgery

## 2016-12-10 VITALS — BP 150/93 | HR 90 | Temp 97.2°F | Resp 18 | Ht 66.0 in | Wt 250.0 lb

## 2016-12-10 DIAGNOSIS — M25561 Pain in right knee: Secondary | ICD-10-CM | POA: Diagnosis not present

## 2016-12-10 DIAGNOSIS — I10 Essential (primary) hypertension: Secondary | ICD-10-CM | POA: Diagnosis not present

## 2016-12-10 DIAGNOSIS — F17210 Nicotine dependence, cigarettes, uncomplicated: Secondary | ICD-10-CM | POA: Diagnosis not present

## 2016-12-10 DIAGNOSIS — G8929 Other chronic pain: Secondary | ICD-10-CM | POA: Diagnosis not present

## 2016-12-10 MED ORDER — HYDROCODONE-ACETAMINOPHEN 7.5-325 MG PO TABS: 1.0000 | ORAL_TABLET | Freq: Four times a day (QID) | ORAL | 0 refills | Status: DC | PRN

## 2016-12-10 NOTE — Progress Notes (Signed)
Patient IO:[id number] [name], female DOB:04/02/1961, 55 y.o. [id number]  Chief Complaint  Patient presents with  . Follow-up    Recheck right knee.    HPI  Tracy [name] is a 55 y.o. female who has chronic pain of the right knee. She has no giving way or swelling. The cold weather makes her pain worse.  She has popping and swelling.  She has no new trauma. HPI  Body mass index is 40.35 kg/m.  ROS  Review of Systems  Constitutional:       Patient has Diabetes Mellitus. Patient has hypertension. Patient has COPD or shortness of breath. Patient has BMI > 35. Patient has current smoking history.  HENT: Negative for congestion.   Respiratory: Positive for cough and shortness of breath.   Cardiovascular: Negative for chest pain.  Endocrine: Positive for cold intolerance.  Musculoskeletal: Positive for arthralgias, gait problem, joint swelling and myalgias.  Allergic/Immunologic: Positive for environmental allergies.  Neurological: Negative for numbness.    Past Medical History:  Diagnosis Date  . Acute ischemic stroke ([place]) 07/03/2012  . Crack cocaine use   . Diabetes ([place])   . Diabetes mellitus ([place])   . DVT (deep venous thrombosis) ([place])   . Hemiparesis, acute ([place]) 07/04/2012  . Hypertension   . LVH (left ventricular hypertrophy) 07/04/2012   Ejection fraction 60%.  . Myocardial infarct, old   . Thrombocytopenia ([place]) 07/03/2012  . Tobacco abuse     Past Surgical History:  Procedure Laterality Date  . ABDOMINAL HYSTERECTOMY      History reviewed. No pertinent family history.  Social History Social History  Substance Use Topics  . Smoking status: Current Every Day Smoker    Packs/day: 1.00    Types: Cigarettes  . Smokeless tobacco: Never Used  . Alcohol use No    No Known Allergies  Current Outpatient Prescriptions  Medication Sig Dispense Refill  . albuterol (PROVENTIL HFA;VENTOLIN HFA) 108 (90 BASE) MCG/ACT inhaler Inhale 2 puffs into the lungs  every 4 (four) hours as needed for wheezing or shortness of breath. 1 Inhaler 0  . amLODipine (NORVASC) 10 MG tablet Take 1 tablet (10 mg total) by mouth daily. 30 tablet 1  . atorvastatin (LIPITOR) 10 MG tablet Take 10 mg by mouth at bedtime.    . Fluticasone-Salmeterol (ADVAIR) 250-50 MCG/DOSE AEPB Inhale 1 puff into the lungs 2 (two) times daily.    [name] gabapentin (NEURONTIN) 300 MG capsule Take 300 mg by mouth 3 (three) times daily.    [name] guaiFENesin (MUCINEX) 600 MG 12 hr tablet Take 2 tablets (1,200 mg total) by mouth 2 (two) times daily. 20 tablet 0  . hydrALAZINE (APRESOLINE) 25 MG tablet Take 1 tablet (25 mg total) by mouth every 8 (eight) hours. 90 tablet 1  . HYDROcodone-acetaminophen (NORCO) 7.5-325 MG tablet Take 1 tablet by mouth every 6 (six) hours as needed for moderate pain (Must last 30 days.Do not drive or operate machinery while taking this medicine.). 100 tablet 0  . ipratropium-albuterol (DUONEB) 0.5-2.5 (3) MG/3ML SOLN Take 3 mLs by nebulization every 4 (four) hours as needed. 360 mL 1  . lisinopril-hydrochlorothiazide (PRINZIDE,ZESTORETIC) 20-12.5 MG per tablet Take 2 tablets by mouth daily.    . metFORMIN (GLUCOPHAGE-XR) 500 MG 24 hr tablet Take 500 mg by mouth daily.    . metoprolol tartrate (LOPRESSOR) 25 MG tablet Take 1 tablet (25 mg total) by mouth 2 (two) times daily. 60 tablet 1  . tiotropium (SPIRIVA) 18 MCG inhalation capsule Place 18 mcg  into inhaler and inhale daily.    [name] warfarin (COUMADIN) 5 MG tablet Take 1 tablet (5 mg total) by mouth daily. Resume on 3/29 30 tablet 0   No current facility-administered medications for this visit.      Physical Exam  Blood pressure (!) 150/93, pulse 90, temperature 97.2 F (36.2 C), resp. rate 18, height 5\' 6"  (1.676 m), weight 250 lb (113.4 kg).  Constitutional: overall normal hygiene, normal nutrition, well developed, normal grooming, normal body habitus. Assistive device:none  Musculoskeletal: gait and station Limp  right, muscle tone and strength are normal, no tremors or atrophy is present.  .  Neurological: coordination overall normal.  Deep tendon reflex/nerve stretch intact.  Sensation normal.  Cranial nerves II-XII intact.   Skin:   Normal overall no scars, lesions, ulcers or rashes. No psoriasis.  Psychiatric: Alert and oriented x 3.  Recent memory intact, remote memory unclear.  Normal mood and affect. Well groomed.  Good eye contact.  Cardiovascular: overall no swelling, no varicosities, no edema bilaterally, normal temperatures of the legs and arms, no clubbing, cyanosis and good capillary refill.  Lymphatic: palpation is normal.  The right lower extremity is examined:  Inspection:  Thigh:  Non-tender and no defects  Knee has swelling 1+ effusion.                        Joint tenderness is present                        Patient is tender over the medial joint line  Lower Leg:  Has normal appearance and no tenderness or defects  Ankle:  Non-tender and no defects  Foot:  Non-tender and no defects Range of Motion:  Knee:  Range of motion is: 0-105                        Crepitus is  present  Ankle:  Range of motion is normal. Strength and Tone:  The right lower extremity has normal strength and tone. Stability:  Knee:  The knee is stable.  Ankle:  The ankle is stable.    The patient has been educated about the nature of the problem(s) and counseled on treatment options.  The patient appeared to understand what I have discussed and is in agreement with it.  Encounter Diagnoses  Name Primary?  . Chronic pain of right knee Yes  . Essential hypertension   . Cigarette nicotine dependence without complication     PLAN Call if any problems.  Precautions discussed.  Continue current medications.   Return to clinic 1 month   Electronically Signed Sanjuana Kava, MD 12/12/20179:53 AM

## 2016-12-17 DIAGNOSIS — H11133 Conjunctival pigmentations, bilateral: Secondary | ICD-10-CM | POA: Diagnosis not present

## 2016-12-17 DIAGNOSIS — H40011 Open angle with borderline findings, low risk, right eye: Secondary | ICD-10-CM | POA: Diagnosis not present

## 2016-12-17 DIAGNOSIS — H52223 Regular astigmatism, bilateral: Secondary | ICD-10-CM | POA: Diagnosis not present

## 2016-12-17 DIAGNOSIS — H4423 Degenerative myopia, bilateral: Secondary | ICD-10-CM | POA: Diagnosis not present

## 2016-12-17 DIAGNOSIS — H40013 Open angle with borderline findings, low risk, bilateral: Secondary | ICD-10-CM | POA: Diagnosis not present

## 2016-12-17 DIAGNOSIS — H40012 Open angle with borderline findings, low risk, left eye: Secondary | ICD-10-CM | POA: Diagnosis not present

## 2016-12-17 DIAGNOSIS — H04123 Dry eye syndrome of bilateral lacrimal glands: Secondary | ICD-10-CM | POA: Diagnosis not present

## 2016-12-17 DIAGNOSIS — H5213 Myopia, bilateral: Secondary | ICD-10-CM | POA: Diagnosis not present

## 2016-12-27 DIAGNOSIS — I80299 Phlebitis and thrombophlebitis of other deep vessels of unspecified lower extremity: Secondary | ICD-10-CM | POA: Diagnosis not present

## 2016-12-27 DIAGNOSIS — Z86718 Personal history of other venous thrombosis and embolism: Secondary | ICD-10-CM | POA: Diagnosis not present

## 2016-12-27 DIAGNOSIS — J449 Chronic obstructive pulmonary disease, unspecified: Secondary | ICD-10-CM | POA: Diagnosis not present

## 2017-01-07 ENCOUNTER — Ambulatory Visit (INDEPENDENT_AMBULATORY_CARE_PROVIDER_SITE_OTHER): Payer: Medicare Other | Admitting: Orthopaedic Surgery

## 2017-01-07 VITALS — BP 158/100 | HR 77 | Temp 97.3°F | Ht 66.0 in | Wt 251.0 lb

## 2017-01-07 DIAGNOSIS — M25561 Pain in right knee: Secondary | ICD-10-CM | POA: Diagnosis not present

## 2017-01-07 DIAGNOSIS — F17210 Nicotine dependence, cigarettes, uncomplicated: Secondary | ICD-10-CM

## 2017-01-07 DIAGNOSIS — I10 Essential (primary) hypertension: Secondary | ICD-10-CM | POA: Diagnosis not present

## 2017-01-07 DIAGNOSIS — G8929 Other chronic pain: Secondary | ICD-10-CM | POA: Diagnosis not present

## 2017-01-07 MED ORDER — HYDROCODONE-ACETAMINOPHEN 7.5-325 MG PO TABS: 1.0000 | ORAL_TABLET | Freq: Four times a day (QID) | ORAL | 0 refills | Status: DC | PRN

## 2017-01-07 NOTE — Patient Instructions (Signed)
Steps to Quit Smoking Smoking tobacco can be bad for your health. It can also affect almost every organ in your body. Smoking puts you and people around you at risk for many serious long-lasting (chronic) diseases. Quitting smoking is hard, but it is one of the best things that you can do for your health. It is never too late to quit. What are the benefits of quitting smoking? When you quit smoking, you lower your risk for getting serious diseases and conditions. They can include:  Lung cancer or lung disease.  Heart disease.  Stroke.  Heart attack.  Not being able to have children (infertility).  Weak bones (osteoporosis) and broken bones (fractures). If you have coughing, wheezing, and shortness of breath, those symptoms may get better when you quit. You may also get sick less often. If you are pregnant, quitting smoking can help to lower your chances of having a baby of low birth weight. What can I do to help me quit smoking? Talk with your doctor about what can help you quit smoking. Some things you can do (strategies) include:  Quitting smoking totally, instead of slowly cutting back how much you smoke over a period of time.  Going to in-person counseling. You are more likely to quit if you go to many counseling sessions.  Using resources and support systems, such as:  Online chats with a counselor.  Phone quitlines.  Printed self-help materials.  Support groups or group counseling.  Text messaging programs.  Mobile phone apps or applications.  Taking medicines. Some of these medicines may have nicotine in them. If you are pregnant or breastfeeding, do not take any medicines to quit smoking unless your doctor says it is okay. Talk with your doctor about counseling or other things that can help you. Talk with your doctor about using more than one strategy at the same time, such as taking medicines while you are also going to in-person counseling. This can help make quitting  easier. What things can I do to make it easier to quit? Quitting smoking might feel very hard at first, but there is a lot that you can do to make it easier. Take these steps:  Talk to your family and friends. Ask them to support and encourage you.  Call phone quitlines, reach out to support groups, or work with a counselor.  Ask people who smoke to not smoke around you.  Avoid places that make you want (trigger) to smoke, such as:  Bars.  Parties.  Smoke-break areas at work.  Spend time with people who do not smoke.  Lower the stress in your life. Stress can make you want to smoke. Try these things to help your stress:  Getting regular exercise.  Deep-breathing exercises.  Yoga.  Meditating.  Doing a body scan. To do this, close your eyes, focus on one area of your body at a time from head to toe, and notice which parts of your body are tense. Try to relax the muscles in those areas.  Download or buy apps on your mobile phone or tablet that can help you stick to your quit plan. There are many free apps, such as QuitGuide from the CDC (Centers for Disease Control and Prevention). You can find more support from smokefree.gov and other websites. This information is not intended to replace advice given to you by your health care provider. Make sure you discuss any questions you have with your health care provider. Document Released: 10/12/2009 Document Revised: 08/13/2016 Document   Reviewed: 05/02/2015 Elsevier Interactive Patient Education  2017 [employer]  

## 2017-01-07 NOTE — Progress Notes (Signed)
Patient Tracy [name], female DOB:08/05/1961, 56 y.o. XBD:[id number]  Chief Complaint  Patient presents with  . Follow-up    Right knee pain    HPI  Tracy [name] is a 56 y.o. female who has chronic pain of the right knee. She has no giving way. She has swelling and popping.  She has no new trauma. HPI  Body mass index is 40.51 kg/m.  ROS  Review of Systems  Constitutional:       Patient has Diabetes Mellitus. Patient has hypertension. Patient has COPD or shortness of breath. Patient has BMI > 35. Patient has current smoking history.  HENT: Negative for congestion.   Respiratory: Positive for cough and shortness of breath.   Cardiovascular: Negative for chest pain.  Endocrine: Positive for cold intolerance.  Musculoskeletal: Positive for arthralgias, gait problem, joint swelling and myalgias.  Allergic/Immunologic: Positive for environmental allergies.  Neurological: Negative for numbness.    Past Medical History:  Diagnosis Date  . Acute ischemic stroke ([place]) 07/03/2012  . Crack cocaine use   . Diabetes ([place])   . Diabetes mellitus ([place])   . DVT (deep venous thrombosis) ([place])   . Hemiparesis, acute ([place]) 07/04/2012  . Hypertension   . LVH (left ventricular hypertrophy) 07/04/2012   Ejection fraction 60%.  . Myocardial infarct, old   . Thrombocytopenia ([place]) 07/03/2012  . Tobacco abuse     Past Surgical History:  Procedure Laterality Date  . ABDOMINAL HYSTERECTOMY      No family history on file.  Social History Social History  Substance Use Topics  . Smoking status: Current Every Day Smoker    Packs/day: 1.00    Types: Cigarettes  . Smokeless tobacco: Never Used  . Alcohol use No    No Known Allergies  Current Outpatient Prescriptions  Medication Sig Dispense Refill  . albuterol (PROVENTIL HFA;VENTOLIN HFA) 108 (90 BASE) MCG/ACT inhaler Inhale 2 puffs into the lungs every 4 (four) hours as needed for wheezing or shortness of breath. 1  Inhaler 0  . amLODipine (NORVASC) 10 MG tablet Take 1 tablet (10 mg total) by mouth daily. 30 tablet 1  . atorvastatin (LIPITOR) 10 MG tablet Take 10 mg by mouth at bedtime.    . Fluticasone-Salmeterol (ADVAIR) 250-50 MCG/DOSE AEPB Inhale 1 puff into the lungs 2 (two) times daily.    [name] gabapentin (NEURONTIN) 300 MG capsule Take 300 mg by mouth 3 (three) times daily.    [name] guaiFENesin (MUCINEX) 600 MG 12 hr tablet Take 2 tablets (1,200 mg total) by mouth 2 (two) times daily. 20 tablet 0  . hydrALAZINE (APRESOLINE) 25 MG tablet Take 1 tablet (25 mg total) by mouth every 8 (eight) hours. 90 tablet 1  . HYDROcodone-acetaminophen (NORCO) 7.5-325 MG tablet Take 1 tablet by mouth every 6 (six) hours as needed for moderate pain (Must last 30 days.Do not drive or operate machinery while taking this medicine.). 100 tablet 0  . ipratropium-albuterol (DUONEB) 0.5-2.5 (3) MG/3ML SOLN Take 3 mLs by nebulization every 4 (four) hours as needed. 360 mL 1  . lisinopril-hydrochlorothiazide (PRINZIDE,ZESTORETIC) 20-12.5 MG per tablet Take 2 tablets by mouth daily.    . metFORMIN (GLUCOPHAGE-XR) 500 MG 24 hr tablet Take 500 mg by mouth daily.    . metoprolol tartrate (LOPRESSOR) 25 MG tablet Take 1 tablet (25 mg total) by mouth 2 (two) times daily. 60 tablet 1  . tiotropium (SPIRIVA) 18 MCG inhalation capsule Place 18 mcg into inhaler and inhale daily.    [name] warfarin (COUMADIN)  5 MG tablet Take 1 tablet (5 mg total) by mouth daily. Resume on 3/29 30 tablet 0   No current facility-administered medications for this visit.      Physical Exam  Blood pressure (!) 158/100, pulse 77, temperature 97.3 F (36.3 C), height 5\' 6"  (1.676 m), weight 251 lb (113.9 kg).  Constitutional: overall normal hygiene, normal nutrition, well developed, normal grooming, normal body habitus. Assistive device:none  Musculoskeletal: gait and station Limp right, muscle tone and strength are normal, no tremors or atrophy is present.  .   Neurological: coordination overall normal.  Deep tendon reflex/nerve stretch intact.  Sensation normal.  Cranial nerves II-XII intact.   Skin:   Normal overall no scars, lesions, ulcers or rashes. No psoriasis.  Psychiatric: Alert and oriented x 3.  Recent memory intact, remote memory unclear.  Normal mood and affect. Well groomed.  Good eye contact.  Cardiovascular: overall no swelling, no varicosities, no edema bilaterally, normal temperatures of the legs and arms, no clubbing, cyanosis and good capillary refill.  Lymphatic: palpation is normal.  The right lower extremity is examined:  Inspection:  Thigh:  Non-tender and no defects  Knee has swelling 1+ effusion.                        Joint tenderness is present                        Patient is tender over the medial joint line  Lower Leg:  Has normal appearance and no tenderness or defects  Ankle:  Non-tender and no defects  Foot:  Non-tender and no defects Range of Motion:  Knee:  Range of motion is: 0-105                        Crepitus is  present  Ankle:  Range of motion is normal. Strength and Tone:  The right lower extremity has normal strength and tone. Stability:  Knee:  The knee is stable.  Ankle:  The ankle is stable.  She continues to smoke and wants to keep doing what she is doing.  The patient has been educated about the nature of the problem(s) and counseled on treatment options.  The patient appeared to understand what I have discussed and is in agreement with it.  Encounter Diagnoses  Name Primary?  . Chronic pain of right knee Yes  . Essential hypertension   . Cigarette nicotine dependence without complication     PLAN Call if any problems.  Precautions discussed.  Continue current medications.   Return to clinic 3 months   Electronically Signed Sanjuana Kava, MD 1/9/201810:49 AM

## 2017-01-13 DIAGNOSIS — I803 Phlebitis and thrombophlebitis of lower extremities, unspecified: Secondary | ICD-10-CM | POA: Insufficient documentation

## 2017-01-14 DIAGNOSIS — E782 Mixed hyperlipidemia: Secondary | ICD-10-CM | POA: Diagnosis not present

## 2017-01-14 DIAGNOSIS — I80299 Phlebitis and thrombophlebitis of other deep vessels of unspecified lower extremity: Secondary | ICD-10-CM | POA: Diagnosis not present

## 2017-01-14 DIAGNOSIS — I10 Essential (primary) hypertension: Secondary | ICD-10-CM | POA: Diagnosis not present

## 2017-01-14 DIAGNOSIS — E1165 Type 2 diabetes mellitus with hyperglycemia: Secondary | ICD-10-CM | POA: Diagnosis not present

## 2017-01-19 DIAGNOSIS — K429 Umbilical hernia without obstruction or gangrene: Secondary | ICD-10-CM | POA: Insufficient documentation

## 2017-01-20 DIAGNOSIS — Z7901 Long term (current) use of anticoagulants: Secondary | ICD-10-CM | POA: Diagnosis not present

## 2017-01-20 DIAGNOSIS — E1165 Type 2 diabetes mellitus with hyperglycemia: Secondary | ICD-10-CM | POA: Diagnosis not present

## 2017-01-20 DIAGNOSIS — J449 Chronic obstructive pulmonary disease, unspecified: Secondary | ICD-10-CM | POA: Diagnosis not present

## 2017-01-20 DIAGNOSIS — I10 Essential (primary) hypertension: Secondary | ICD-10-CM | POA: Diagnosis not present

## 2017-01-20 DIAGNOSIS — Z86718 Personal history of other venous thrombosis and embolism: Secondary | ICD-10-CM | POA: Diagnosis not present

## 2017-01-20 DIAGNOSIS — E782 Mixed hyperlipidemia: Secondary | ICD-10-CM | POA: Diagnosis not present

## 2017-02-05 ENCOUNTER — Telehealth: Payer: Self-pay | Admitting: Orthopaedic Surgery

## 2017-02-05 MED ORDER — HYDROCODONE-ACETAMINOPHEN 7.5-325 MG PO TABS: 1.0000 | ORAL_TABLET | Freq: Four times a day (QID) | ORAL | 0 refills | Status: DC | PRN

## 2017-02-05 NOTE — Telephone Encounter (Signed)
Patient requests refill on Hydrocodone/Acetaminophen 7.5-325  Mgs.   Qty  100  Sig: Take 1 tablet by mouth every 6 (six) hours as needed for moderate pain (Must last 30 days.Do not drive or operate machinery while taking this medicine.).

## 2017-02-20 DIAGNOSIS — Z86718 Personal history of other venous thrombosis and embolism: Secondary | ICD-10-CM | POA: Insufficient documentation

## 2017-02-20 DIAGNOSIS — Z6841 Body Mass Index (BMI) 40.0 and over, adult: Secondary | ICD-10-CM | POA: Insufficient documentation

## 2017-02-21 DIAGNOSIS — Z7901 Long term (current) use of anticoagulants: Secondary | ICD-10-CM | POA: Diagnosis not present

## 2017-02-21 DIAGNOSIS — Z86718 Personal history of other venous thrombosis and embolism: Secondary | ICD-10-CM | POA: Diagnosis not present

## 2017-02-26 DIAGNOSIS — Z7901 Long term (current) use of anticoagulants: Secondary | ICD-10-CM | POA: Diagnosis not present

## 2017-03-04 ENCOUNTER — Telehealth: Payer: Self-pay | Admitting: Orthopaedic Surgery

## 2017-03-04 NOTE — Telephone Encounter (Signed)
Hydrocodone-Acetaminophen  7.5/325mg  Qty 90 Tablets °

## 2017-03-06 DIAGNOSIS — Z7901 Long term (current) use of anticoagulants: Secondary | ICD-10-CM | POA: Insufficient documentation

## 2017-03-07 DIAGNOSIS — Z7901 Long term (current) use of anticoagulants: Secondary | ICD-10-CM | POA: Diagnosis not present

## 2017-03-10 MED ORDER — HYDROCODONE-ACETAMINOPHEN 7.5-325 MG PO TABS: 1.0000 | ORAL_TABLET | Freq: Four times a day (QID) | ORAL | 0 refills | Status: DC | PRN

## 2017-03-21 DIAGNOSIS — M792 Neuralgia and neuritis, unspecified: Secondary | ICD-10-CM | POA: Diagnosis not present

## 2017-04-08 ENCOUNTER — Ambulatory Visit: Payer: Medicare Other | Admitting: Orthopaedic Surgery

## 2017-04-08 DIAGNOSIS — Z7901 Long term (current) use of anticoagulants: Secondary | ICD-10-CM | POA: Diagnosis not present

## 2017-04-09 ENCOUNTER — Encounter: Payer: Self-pay | Admitting: Orthopaedic Surgery

## 2017-04-09 ENCOUNTER — Ambulatory Visit (INDEPENDENT_AMBULATORY_CARE_PROVIDER_SITE_OTHER): Payer: Medicare HMO | Admitting: Orthopaedic Surgery

## 2017-04-09 VITALS — BP 122/77 | HR 106 | Temp 97.9°F | Ht 65.5 in | Wt 257.0 lb

## 2017-04-09 DIAGNOSIS — G8929 Other chronic pain: Secondary | ICD-10-CM | POA: Diagnosis not present

## 2017-04-09 DIAGNOSIS — M25561 Pain in right knee: Secondary | ICD-10-CM

## 2017-04-09 DIAGNOSIS — I10 Essential (primary) hypertension: Secondary | ICD-10-CM

## 2017-04-09 DIAGNOSIS — F17210 Nicotine dependence, cigarettes, uncomplicated: Secondary | ICD-10-CM | POA: Diagnosis not present

## 2017-04-09 MED ORDER — HYDROCODONE-ACETAMINOPHEN 7.5-325 MG PO TABS: 1.0000 | ORAL_TABLET | Freq: Four times a day (QID) | ORAL | 0 refills | Status: DC | PRN

## 2017-04-09 NOTE — Progress Notes (Signed)
Tracy [name], female DOB:1961-05-01, 56 y.o. UJW:[id number]  Chief Complaint  Tracy presents with  . Follow-up    right knee    HPI  Tracy [name] is a 56 y.o. female who has chronic right knee pain. It is worse with the cold weather.  She has no giving way, no locking.  She has swelling and popping.  She is taking her medicine. HPI  Body mass index is 42.12 kg/m.  ROS  Review of Systems  Constitutional:       Tracy has Diabetes Mellitus. Tracy has hypertension. Tracy has COPD or shortness of breath. Tracy has BMI > 35. Tracy has current smoking history.  HENT: Negative for congestion.   Respiratory: Positive for cough and shortness of breath.   Cardiovascular: Negative for chest pain.  Endocrine: Positive for cold intolerance.  Musculoskeletal: Positive for arthralgias, gait problem, joint swelling and myalgias.  Allergic/Immunologic: Positive for environmental allergies.  Neurological: Negative for numbness.    Past Medical History:  Diagnosis Date  . Acute ischemic stroke ([place]) 07/03/2012  . Crack cocaine use   . Diabetes ([place])   . Diabetes mellitus ([place])   . DVT (deep venous thrombosis) ([place])   . Hemiparesis, acute ([place]) 07/04/2012  . Hypertension   . LVH (left ventricular hypertrophy) 07/04/2012   Ejection fraction 60%.  . Myocardial infarct, old   . Thrombocytopenia ([place]) 07/03/2012  . Tobacco abuse     Past Surgical History:  Procedure Laterality Date  . ABDOMINAL HYSTERECTOMY      No family history on file.  Social History Social History  Substance Use Topics  . Smoking status: Current Every Day Smoker    Packs/day: 1.00    Types: Cigarettes  . Smokeless tobacco: Never Used  . Alcohol use No    No Known Allergies  Current Outpatient Prescriptions  Medication Sig Dispense Refill  . albuterol (PROVENTIL HFA;VENTOLIN HFA) 108 (90 BASE) MCG/ACT inhaler Inhale 2 puffs into the lungs every 4 (four) hours as needed for  wheezing or shortness of breath. 1 Inhaler 0  . amLODipine (NORVASC) 10 MG tablet Take 1 tablet (10 mg total) by mouth daily. 30 tablet 1  . atorvastatin (LIPITOR) 10 MG tablet Take 10 mg by mouth at bedtime.    . Fluticasone-Salmeterol (ADVAIR) 250-50 MCG/DOSE AEPB Inhale 1 puff into the lungs 2 (two) times daily.    [name] gabapentin (NEURONTIN) 300 MG capsule Take 300 mg by mouth 3 (three) times daily.    [name] guaiFENesin (MUCINEX) 600 MG 12 hr tablet Take 2 tablets (1,200 mg total) by mouth 2 (two) times daily. 20 tablet 0  . hydrALAZINE (APRESOLINE) 25 MG tablet Take 1 tablet (25 mg total) by mouth every 8 (eight) hours. 90 tablet 1  . HYDROcodone-acetaminophen (NORCO) 7.5-325 MG tablet Take 1 tablet by mouth every 6 (six) hours as needed for moderate pain (Must last 30 days.Do not drive or operate machinery while taking this medicine.). 75 tablet 0  . ipratropium-albuterol (DUONEB) 0.5-2.5 (3) MG/3ML SOLN Take 3 mLs by nebulization every 4 (four) hours as needed. 360 mL 1  . lisinopril-hydrochlorothiazide (PRINZIDE,ZESTORETIC) 20-12.5 MG per tablet Take 2 tablets by mouth daily.    . metFORMIN (GLUCOPHAGE-XR) 500 MG 24 hr tablet Take 500 mg by mouth daily.    . metoprolol tartrate (LOPRESSOR) 25 MG tablet Take 1 tablet (25 mg total) by mouth 2 (two) times daily. 60 tablet 1  . tiotropium (SPIRIVA) 18 MCG inhalation capsule Place 18 mcg into inhaler and  inhale daily.    [name] warfarin (COUMADIN) 5 MG tablet Take 1 tablet (5 mg total) by mouth daily. Resume on 3/29 30 tablet 0   No current facility-administered medications for this visit.      Physical Exam  Blood pressure 122/77, pulse (!) 106, temperature 97.9 F (36.6 C), height 5' 5.5" (1.664 m), weight 257 lb (116.6 kg).  Constitutional: overall normal hygiene, normal nutrition, well developed, normal grooming, normal body habitus. Assistive device:cane  Musculoskeletal: gait and station Limp right, muscle tone and strength are normal, no  tremors or atrophy is present.  .  Neurological: coordination overall normal.  Deep tendon reflex/nerve stretch intact.  Sensation normal.  Cranial nerves II-XII intact.   Skin:   Normal overall no scars, lesions, ulcers or rashes. No psoriasis.  Psychiatric: Alert and oriented x 3.  Recent memory intact, remote memory unclear.  Normal mood and affect. Well groomed.  Good eye contact.  Cardiovascular: overall no swelling, no varicosities, no edema bilaterally, normal temperatures of the legs and arms, no clubbing, cyanosis and good capillary refill.  Lymphatic: palpation is normal.  The right lower extremity is examined:  Inspection:  Thigh:  Non-tender and no defects  Knee has swelling 1+ effusion.                        Joint tenderness is present                        Tracy is tender over the medial joint line  Lower Leg:  Has normal appearance and no tenderness or defects  Ankle:  Non-tender and no defects  Foot:  Non-tender and no defects Range of Motion:  Knee:  Range of motion is: 0-100                        Crepitus is  present  Ankle:  Range of motion is normal. Strength and Tone:  The right lower extremity has normal strength and tone. Stability:  Knee:  The knee is stable.  Ankle:  The ankle is stable.    The Tracy has been educated about the nature of the problem(s) and counseled on treatment options.  The Tracy appeared to understand what I have discussed and is in agreement with it.  Encounter Diagnoses  Name Primary?  . Chronic pain of right knee Yes  . Essential hypertension   . Cigarette nicotine dependence without complication     PLAN Call if any problems.  Precautions discussed.  Continue current medications.   Return to clinic 3 months   I have reviewed the [facility] web site prior to prescribing narcotic medicine for this Tracy.  Electronically Signed Sanjuana Kava, MD 4/11/20189:43  AM

## 2017-04-17 DIAGNOSIS — Z7901 Long term (current) use of anticoagulants: Secondary | ICD-10-CM | POA: Diagnosis not present

## 2017-04-23 DIAGNOSIS — Z86718 Personal history of other venous thrombosis and embolism: Secondary | ICD-10-CM | POA: Diagnosis not present

## 2017-04-23 DIAGNOSIS — F17200 Nicotine dependence, unspecified, uncomplicated: Secondary | ICD-10-CM | POA: Diagnosis not present

## 2017-04-23 DIAGNOSIS — Z7901 Long term (current) use of anticoagulants: Secondary | ICD-10-CM | POA: Diagnosis not present

## 2017-05-06 ENCOUNTER — Telehealth: Payer: Self-pay | Admitting: Orthopaedic Surgery

## 2017-05-06 MED ORDER — HYDROCODONE-ACETAMINOPHEN 7.5-325 MG PO TABS: 1.0000 | ORAL_TABLET | Freq: Four times a day (QID) | ORAL | 0 refills | Status: DC | PRN

## 2017-05-06 NOTE — Telephone Encounter (Signed)
Hydrocodone-Acetaminophen  7.5/325 mg  Qty  75 Tablets

## 2017-05-29 DIAGNOSIS — E782 Mixed hyperlipidemia: Secondary | ICD-10-CM | POA: Diagnosis not present

## 2017-05-29 DIAGNOSIS — E1165 Type 2 diabetes mellitus with hyperglycemia: Secondary | ICD-10-CM | POA: Diagnosis not present

## 2017-05-29 DIAGNOSIS — Z7901 Long term (current) use of anticoagulants: Secondary | ICD-10-CM | POA: Diagnosis not present

## 2017-05-29 DIAGNOSIS — I10 Essential (primary) hypertension: Secondary | ICD-10-CM | POA: Diagnosis not present

## 2017-05-29 DIAGNOSIS — I80299 Phlebitis and thrombophlebitis of other deep vessels of unspecified lower extremity: Secondary | ICD-10-CM | POA: Diagnosis not present

## 2017-06-04 ENCOUNTER — Telehealth: Payer: Self-pay | Admitting: Orthopaedic Surgery

## 2017-06-04 DIAGNOSIS — Z0001 Encounter for general adult medical examination with abnormal findings: Secondary | ICD-10-CM | POA: Diagnosis not present

## 2017-06-04 DIAGNOSIS — I10 Essential (primary) hypertension: Secondary | ICD-10-CM | POA: Diagnosis not present

## 2017-06-04 NOTE — Telephone Encounter (Signed)
Patient called to request refill:  HYDROcodone-acetaminophen (NORCO) 7.5-325 MG tablet 65 tablet 0 05/06/2017

## 2017-06-05 MED ORDER — HYDROCODONE-ACETAMINOPHEN 7.5-325 MG PO TABS: 1.0000 | ORAL_TABLET | Freq: Four times a day (QID) | ORAL | 0 refills | Status: DC | PRN

## 2017-06-13 DIAGNOSIS — M792 Neuralgia and neuritis, unspecified: Secondary | ICD-10-CM | POA: Diagnosis not present

## 2017-06-17 DIAGNOSIS — H4423 Degenerative myopia, bilateral: Secondary | ICD-10-CM | POA: Diagnosis not present

## 2017-06-17 DIAGNOSIS — H40013 Open angle with borderline findings, low risk, bilateral: Secondary | ICD-10-CM | POA: Diagnosis not present

## 2017-06-19 DIAGNOSIS — H25013 Cortical age-related cataract, bilateral: Secondary | ICD-10-CM | POA: Diagnosis not present

## 2017-06-19 DIAGNOSIS — H35363 Drusen (degenerative) of macula, bilateral: Secondary | ICD-10-CM | POA: Diagnosis not present

## 2017-06-19 DIAGNOSIS — H2511 Age-related nuclear cataract, right eye: Secondary | ICD-10-CM | POA: Diagnosis not present

## 2017-06-19 DIAGNOSIS — H2513 Age-related nuclear cataract, bilateral: Secondary | ICD-10-CM | POA: Diagnosis not present

## 2017-06-19 DIAGNOSIS — H35033 Hypertensive retinopathy, bilateral: Secondary | ICD-10-CM | POA: Diagnosis not present

## 2017-06-27 DIAGNOSIS — Z7901 Long term (current) use of anticoagulants: Secondary | ICD-10-CM | POA: Diagnosis not present

## 2017-07-01 DIAGNOSIS — H2511 Age-related nuclear cataract, right eye: Secondary | ICD-10-CM | POA: Diagnosis not present

## 2017-07-01 DIAGNOSIS — H25811 Combined forms of age-related cataract, right eye: Secondary | ICD-10-CM | POA: Diagnosis not present

## 2017-07-09 ENCOUNTER — Ambulatory Visit: Payer: Medicare HMO | Admitting: Orthopaedic Surgery

## 2017-07-15 ENCOUNTER — Encounter: Payer: Self-pay | Admitting: Orthopaedic Surgery

## 2017-07-15 ENCOUNTER — Ambulatory Visit (INDEPENDENT_AMBULATORY_CARE_PROVIDER_SITE_OTHER): Payer: Medicare Other | Admitting: Orthopaedic Surgery

## 2017-07-15 VITALS — BP 130/80 | HR 82 | Temp 97.0°F | Ht 65.5 in | Wt 252.0 lb

## 2017-07-15 DIAGNOSIS — M25561 Pain in right knee: Secondary | ICD-10-CM | POA: Diagnosis not present

## 2017-07-15 DIAGNOSIS — F17210 Nicotine dependence, cigarettes, uncomplicated: Secondary | ICD-10-CM

## 2017-07-15 DIAGNOSIS — G8929 Other chronic pain: Secondary | ICD-10-CM | POA: Diagnosis not present

## 2017-07-15 DIAGNOSIS — I10 Essential (primary) hypertension: Secondary | ICD-10-CM | POA: Diagnosis not present

## 2017-07-15 MED ORDER — HYDROCODONE-ACETAMINOPHEN 7.5-325 MG PO TABS: 1.0000 | ORAL_TABLET | Freq: Four times a day (QID) | ORAL | 0 refills | Status: DC | PRN

## 2017-07-15 NOTE — Patient Instructions (Signed)
Steps to Quit Smoking Smoking tobacco can be bad for your health. It can also affect almost every organ in your body. Smoking puts you and people around you at risk for many serious Aquila Menzie-lasting (chronic) diseases. Quitting smoking is hard, but it is one of the best things that you can do for your health. It is never too late to quit. What are the benefits of quitting smoking? When you quit smoking, you lower your risk for getting serious diseases and conditions. They can include:  Lung cancer or lung disease.  Heart disease.  Stroke.  Heart attack.  Not being able to have children (infertility).  Weak bones (osteoporosis) and broken bones (fractures).  If you have coughing, wheezing, and shortness of breath, those symptoms may get better when you quit. You may also get sick less often. If you are pregnant, quitting smoking can help to lower your chances of having a baby of low birth weight. What can I do to help me quit smoking? Talk with your doctor about what can help you quit smoking. Some things you can do (strategies) include:  Quitting smoking totally, instead of slowly cutting back how much you smoke over a period of time.  Going to in-person counseling. You are more likely to quit if you go to many counseling sessions.  Using resources and support systems, such as: ? Online chats with a counselor. ? Phone quitlines. ? Printed self-help materials. ? Support groups or group counseling. ? Text messaging programs. ? Mobile phone apps or applications.  Taking medicines. Some of these medicines may have nicotine in them. If you are pregnant or breastfeeding, do not take any medicines to quit smoking unless your doctor says it is okay. Talk with your doctor about counseling or other things that can help you.  Talk with your doctor about using more than one strategy at the same time, such as taking medicines while you are also going to in-person counseling. This can help make  quitting easier. What things can I do to make it easier to quit? Quitting smoking might feel very hard at first, but there is a lot that you can do to make it easier. Take these steps:  Talk to your family and friends. Ask them to support and encourage you.  Call phone quitlines, reach out to support groups, or work with a counselor.  Ask people who smoke to not smoke around you.  Avoid places that make you want (trigger) to smoke, such as: ? Bars. ? Parties. ? Smoke-break areas at work.  Spend time with people who do not smoke.  Lower the stress in your life. Stress can make you want to smoke. Try these things to help your stress: ? Getting regular exercise. ? Deep-breathing exercises. ? Yoga. ? Meditating. ? Doing a body scan. To do this, close your eyes, focus on one area of your body at a time from head to toe, and notice which parts of your body are tense. Try to relax the muscles in those areas.  Download or buy apps on your mobile phone or tablet that can help you stick to your quit plan. There are many free apps, such as QuitGuide from the CDC (Centers for Disease Control and Prevention). You can find more support from smokefree.gov and other websites.  This information is not intended to replace advice given to you by your health care provider. Make sure you discuss any questions you have with your health care provider. Document Released: 10/12/2009 Document   Revised: 08/13/2016 Document Reviewed: 05/02/2015 Elsevier Interactive Patient Education  2018 Elsevier Inc.  

## 2017-07-15 NOTE — Progress Notes (Signed)
Patient Tracy [name], female DOB:03-Jan-1961, 56 y.o. FBP:[id number]  Chief Complaint  Patient presents with  . Follow-up    Right Knee    HPI  Tracy [name] is a 56 y.o. female who has chronic pain of the right knee.  She has no giving way, no locking.  She has swelling and popping.  She has no new trauma.  She is active.   HPI  Body mass index is 41.3 kg/m.  ROS  Review of Systems  Constitutional:       Patient has Diabetes Mellitus. Patient has hypertension. Patient has COPD or shortness of breath. Patient has BMI > 35. Patient has current smoking history.  HENT: Negative for congestion.   Respiratory: Positive for cough and shortness of breath.   Cardiovascular: Negative for chest pain.  Endocrine: Positive for cold intolerance.  Musculoskeletal: Positive for arthralgias, gait problem, joint swelling and myalgias.  Allergic/Immunologic: Positive for environmental allergies.  Neurological: Negative for numbness.    Past Medical History:  Diagnosis Date  . Acute ischemic stroke ([place]) 07/03/2012  . Crack cocaine use   . Diabetes ([place])   . Diabetes mellitus ([place])   . DVT (deep venous thrombosis) ([place])   . Hemiparesis, acute ([place]) 07/04/2012  . Hypertension   . LVH (left ventricular hypertrophy) 07/04/2012   Ejection fraction 60%.  . Myocardial infarct, old   . Thrombocytopenia ([place]) 07/03/2012  . Tobacco abuse     Past Surgical History:  Procedure Laterality Date  . ABDOMINAL HYSTERECTOMY      History reviewed. No pertinent family history.  Social History Social History  Substance Use Topics  . Smoking status: Current Every Day Smoker    Packs/day: 1.00    Types: Cigarettes  . Smokeless tobacco: Never Used  . Alcohol use No    No Known Allergies  Current Outpatient Prescriptions  Medication Sig Dispense Refill  . albuterol (PROVENTIL HFA;VENTOLIN HFA) 108 (90 BASE) MCG/ACT inhaler Inhale 2 puffs into the lungs every 4 (four) hours as needed  for wheezing or shortness of breath. 1 Inhaler 0  . amLODipine (NORVASC) 10 MG tablet Take 1 tablet (10 mg total) by mouth daily. 30 tablet 1  . atorvastatin (LIPITOR) 10 MG tablet Take 10 mg by mouth at bedtime.    . Fluticasone-Salmeterol (ADVAIR) 250-50 MCG/DOSE AEPB Inhale 1 puff into the lungs 2 (two) times daily.    [name] gabapentin (NEURONTIN) 300 MG capsule Take 300 mg by mouth 3 (three) times daily.    [name] guaiFENesin (MUCINEX) 600 MG 12 hr tablet Take 2 tablets (1,200 mg total) by mouth 2 (two) times daily. 20 tablet 0  . hydrALAZINE (APRESOLINE) 25 MG tablet Take 1 tablet (25 mg total) by mouth every 8 (eight) hours. 90 tablet 1  . HYDROcodone-acetaminophen (NORCO) 7.5-325 MG tablet Take 1 tablet by mouth every 6 (six) hours as needed for moderate pain (Must last 30 days.Do not drive or operate machinery while taking this medicine.). 60 tablet 0  . ipratropium-albuterol (DUONEB) 0.5-2.5 (3) MG/3ML SOLN Take 3 mLs by nebulization every 4 (four) hours as needed. 360 mL 1  . lisinopril-hydrochlorothiazide (PRINZIDE,ZESTORETIC) 20-12.5 MG per tablet Take 2 tablets by mouth daily.    . metFORMIN (GLUCOPHAGE-XR) 500 MG 24 hr tablet Take 500 mg by mouth daily.    . metoprolol tartrate (LOPRESSOR) 25 MG tablet Take 1 tablet (25 mg total) by mouth 2 (two) times daily. 60 tablet 1  . tiotropium (SPIRIVA) 18 MCG inhalation capsule Place 18 mcg into  inhaler and inhale daily.    [name] warfarin (COUMADIN) 5 MG tablet Take 1 tablet (5 mg total) by mouth daily. Resume on 3/29 30 tablet 0   No current facility-administered medications for this visit.      Physical Exam  Blood pressure 130/80, pulse 82, temperature (!) 97 F (36.1 C), height 5' 5.5" (1.664 m), weight 252 lb (114.3 kg).  Constitutional: overall normal hygiene, normal nutrition, well developed, normal grooming, normal body habitus. Assistive device:none  Musculoskeletal: gait and station Limp right, muscle tone and strength are normal, no  tremors or atrophy is present.  .  Neurological: coordination overall normal.  Deep tendon reflex/nerve stretch intact.  Sensation normal.  Cranial nerves II-XII intact.   Skin:   Normal overall no scars, lesions, ulcers or rashes. No psoriasis.  Psychiatric: Alert and oriented x 3.  Recent memory intact, remote memory unclear.  Normal mood and affect. Well groomed.  Good eye contact.  Cardiovascular: overall no swelling, no varicosities, no edema bilaterally, normal temperatures of the legs and arms, no clubbing, cyanosis and good capillary refill.  Lymphatic: palpation is normal.  The right lower extremity is examined:  Inspection:  Thigh:  Non-tender and no defects  Knee has swelling 1+ effusion.                        Joint tenderness is present                        Patient is tender over the medial joint line  Lower Leg:  Has normal appearance and no tenderness or defects  Ankle:  Non-tender and no defects  Foot:  Non-tender and no defects Range of Motion:  Knee:  Range of motion is: 0-105                        Crepitus is  present  Ankle:  Range of motion is normal. Strength and Tone:  The right lower extremity has normal strength and tone. Stability:  Knee:  The knee is stable.  Ankle:  The ankle is stable.    The patient has been educated about the nature of the problem(s) and counseled on treatment options.  The patient appeared to understand what I have discussed and is in agreement with it.  Encounter Diagnoses  Name Primary?  . Chronic pain of right knee Yes  . Essential hypertension   . Cigarette nicotine dependence without complication     PLAN Call if any problems.  Precautions discussed.  Continue current medications.   Return to clinic 3 months   I have reviewed the [place] web site prior to prescribing narcotic medicine for this patient.  Electronically Signed Sanjuana Kava, MD 7/17/201810:27  AM

## 2017-07-21 DIAGNOSIS — H2512 Age-related nuclear cataract, left eye: Secondary | ICD-10-CM | POA: Diagnosis not present

## 2017-07-25 DIAGNOSIS — I80299 Phlebitis and thrombophlebitis of other deep vessels of unspecified lower extremity: Secondary | ICD-10-CM | POA: Diagnosis not present

## 2017-07-29 DIAGNOSIS — H2512 Age-related nuclear cataract, left eye: Secondary | ICD-10-CM | POA: Diagnosis not present

## 2017-07-29 DIAGNOSIS — H25812 Combined forms of age-related cataract, left eye: Secondary | ICD-10-CM | POA: Diagnosis not present

## 2017-08-01 DIAGNOSIS — I80299 Phlebitis and thrombophlebitis of other deep vessels of unspecified lower extremity: Secondary | ICD-10-CM | POA: Diagnosis not present

## 2017-08-12 ENCOUNTER — Telehealth: Payer: Self-pay | Admitting: Orthopaedic Surgery

## 2017-08-12 DIAGNOSIS — J449 Chronic obstructive pulmonary disease, unspecified: Secondary | ICD-10-CM | POA: Diagnosis not present

## 2017-08-12 DIAGNOSIS — I80299 Phlebitis and thrombophlebitis of other deep vessels of unspecified lower extremity: Secondary | ICD-10-CM | POA: Diagnosis not present

## 2017-08-12 MED ORDER — HYDROCODONE-ACETAMINOPHEN 7.5-325 MG PO TABS: 1.0000 | ORAL_TABLET | Freq: Four times a day (QID) | ORAL | 0 refills | Status: DC | PRN

## 2017-08-12 NOTE — Telephone Encounter (Signed)
Patient requests refill  HYDROcodone-acetaminophen (NORCO) 7.5-325 MG tablet 60 tablet

## 2017-08-19 DIAGNOSIS — Z961 Presence of intraocular lens: Secondary | ICD-10-CM | POA: Diagnosis not present

## 2017-09-02 DIAGNOSIS — J449 Chronic obstructive pulmonary disease, unspecified: Secondary | ICD-10-CM | POA: Diagnosis not present

## 2017-09-02 DIAGNOSIS — I10 Essential (primary) hypertension: Secondary | ICD-10-CM | POA: Diagnosis not present

## 2017-09-02 DIAGNOSIS — E1165 Type 2 diabetes mellitus with hyperglycemia: Secondary | ICD-10-CM | POA: Diagnosis not present

## 2017-09-05 DIAGNOSIS — J449 Chronic obstructive pulmonary disease, unspecified: Secondary | ICD-10-CM | POA: Diagnosis not present

## 2017-09-05 DIAGNOSIS — E1165 Type 2 diabetes mellitus with hyperglycemia: Secondary | ICD-10-CM | POA: Diagnosis not present

## 2017-09-05 DIAGNOSIS — I4510 Unspecified right bundle-branch block: Secondary | ICD-10-CM | POA: Insufficient documentation

## 2017-09-05 DIAGNOSIS — R0600 Dyspnea, unspecified: Secondary | ICD-10-CM | POA: Diagnosis not present

## 2017-09-05 DIAGNOSIS — E119 Type 2 diabetes mellitus without complications: Secondary | ICD-10-CM | POA: Diagnosis not present

## 2017-09-05 DIAGNOSIS — I10 Essential (primary) hypertension: Secondary | ICD-10-CM | POA: Diagnosis not present

## 2017-09-05 DIAGNOSIS — E782 Mixed hyperlipidemia: Secondary | ICD-10-CM | POA: Diagnosis not present

## 2017-09-09 ENCOUNTER — Other Ambulatory Visit (HOSPITAL_COMMUNITY)
Admission: RE | Admit: 2017-09-09 | Discharge: 2017-09-09 | Disposition: A | Discharge status: Home / Self Care | Payer: Medicare Other | Source: Ambulatory Visit | Attending: Physician Assistant | Admitting: Physician Assistant

## 2017-09-09 ENCOUNTER — Ambulatory Visit (HOSPITAL_COMMUNITY)
Admission: RE | Admit: 2017-09-09 | Discharge: 2017-09-09 | Disposition: A | Discharge status: Home / Self Care | Payer: Medicare Other | Source: Ambulatory Visit | Attending: Physician Assistant | Admitting: Physician Assistant

## 2017-09-09 ENCOUNTER — Other Ambulatory Visit (HOSPITAL_COMMUNITY): Payer: Self-pay | Admitting: Physician Assistant

## 2017-09-09 DIAGNOSIS — R0600 Dyspnea, unspecified: Secondary | ICD-10-CM

## 2017-09-09 DIAGNOSIS — J449 Chronic obstructive pulmonary disease, unspecified: Secondary | ICD-10-CM | POA: Diagnosis not present

## 2017-09-09 DIAGNOSIS — I509 Heart failure, unspecified: Secondary | ICD-10-CM | POA: Insufficient documentation

## 2017-09-09 DIAGNOSIS — J90 Pleural effusion, not elsewhere classified: Secondary | ICD-10-CM | POA: Insufficient documentation

## 2017-09-09 DIAGNOSIS — R600 Localized edema: Secondary | ICD-10-CM | POA: Insufficient documentation

## 2017-09-09 DIAGNOSIS — R0602 Shortness of breath: Secondary | ICD-10-CM | POA: Diagnosis not present

## 2017-09-09 LAB — BRAIN NATRIURETIC PEPTIDE: B Natriuretic Peptide: 30 pg/mL (ref 0.0–100.0)

## 2017-09-11 ENCOUNTER — Telehealth: Payer: Self-pay | Admitting: Orthopaedic Surgery

## 2017-09-11 MED ORDER — HYDROCODONE-ACETAMINOPHEN 7.5-325 MG PO TABS: 1.0000 | ORAL_TABLET | Freq: Four times a day (QID) | ORAL | 0 refills | Status: DC | PRN

## 2017-09-11 NOTE — Telephone Encounter (Signed)
Patient called for refill,  HYDROcodone-acetaminophen (NORCO) 7.5-325 MG tablet 60 tablet

## 2017-09-16 ENCOUNTER — Encounter: Payer: Self-pay | Admitting: Cardiovascular Disease

## 2017-09-16 ENCOUNTER — Ambulatory Visit (INDEPENDENT_AMBULATORY_CARE_PROVIDER_SITE_OTHER): Payer: Medicare Other | Admitting: Cardiovascular Disease

## 2017-09-16 VITALS — BP 98/60 | HR 103 | Ht 66.5 in | Wt 246.0 lb

## 2017-09-16 DIAGNOSIS — I872 Venous insufficiency (chronic) (peripheral): Secondary | ICD-10-CM | POA: Diagnosis not present

## 2017-09-16 DIAGNOSIS — I10 Essential (primary) hypertension: Secondary | ICD-10-CM

## 2017-09-16 DIAGNOSIS — Z716 Tobacco abuse counseling: Secondary | ICD-10-CM

## 2017-09-16 DIAGNOSIS — I252 Old myocardial infarction: Secondary | ICD-10-CM | POA: Diagnosis not present

## 2017-09-16 DIAGNOSIS — Z86718 Personal history of other venous thrombosis and embolism: Secondary | ICD-10-CM

## 2017-09-16 DIAGNOSIS — R600 Localized edema: Secondary | ICD-10-CM | POA: Diagnosis not present

## 2017-09-16 NOTE — Patient Instructions (Addendum)
Your physician recommends that you schedule a follow-up appointment in: 3 months with Dr Bronson Ing   Your physician recommends that you continue on your current medications as directed. Please refer to the Current Medication list given to you today.    Your physician has requested that you have an echocardiogram. Echocardiography is a painless test that uses sound waves to create images of your heart. It provides your doctor with information about the size and shape of your heart and how well your heart's chambers and valves are working. This procedure takes approximately one hour. There are no restrictions for this procedure.   Your physician has requested that you have a lower extremity venous duplex. This test is an ultrasound of the veins in the legs . It looks at venous blood flow that carries blood from the heart to the legs. Allow one hour for a Lower Venous exam.  There are no restrictions or special instructions.      Thank you for choosing [facility] !

## 2017-09-16 NOTE — Progress Notes (Signed)
CARDIOLOGY CONSULT NOTE  Patient ID: Tracy [name] MRN: [id number] DOB/AGE: Oct 02, 1961 56 y.o.  Admit date: (Not on file) Primary Physician: Rory Percy, MD Referring Physician: Nadara Mustard  Reason for Consultation: shortness of breath and edema  HPI: Tracy [name] is a 56 y.o. female who is being seen today for the evaluation of shortness of breath and edema at the request of Rory Percy, MD.   She has a history of hypertension and type 2 diabetes mellitus.  She sustained a right lower extremity DVT on 08/16/13. I reviewed venous Dopplers which demonstrated right femoral and popliteal deep vein thrombosis.  She told me she had an MI in 2007. She said she was transferred to [facility] or [facility] and cardiac catheterization was done but was told "there were no blockages ".  She tells me she sustained a "light stroke" in 2013. Carotid Dopplers on 07/03/12 showed no evidence of carotid artery stenosis.  Echocardiogram on 07/03/12 demonstrated normal left ventricular systolic function, LVEF 36-46%, with moderate LVH.  I personally reviewed PCP notes and labs.  09/03/17: A1c 7.2%, BUN 11, creatinine 0.98, sodium 141, potassium 3.8.   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  She denies chest pain. She said she had bilateral leg and ankle edema, right worse than left. She was prescribed Lasix last week, 20 mg daily, and the leg swelling has essentially resolved.  She denies exertional chest pain. She has chronic exertional dyspnea from COPD which has not gotten worse.  She smokes over a pack of cigarettes daily.  She is here with her daughter.      No Known Allergies  Current Outpatient Prescriptions  Medication Sig Dispense Refill  . albuterol (PROVENTIL HFA;VENTOLIN HFA) 108 (90 BASE) MCG/ACT inhaler Inhale 2 puffs  into the lungs every 4 (four) hours as needed for wheezing or shortness of breath. 1 Inhaler 0  . budesonide-formoterol (SYMBICORT) 160-4.5 MCG/ACT inhaler Inhale 2 puffs into the lungs 2 (two) times daily.    . furosemide (LASIX) 20 MG tablet Take 20 mg by mouth.    . gabapentin (NEURONTIN) 300 MG capsule Take 300 mg by mouth 3 (three) times daily.    . hydrALAZINE (APRESOLINE) 25 MG tablet Take 1 tablet (25 mg total) by mouth every 8 (eight) hours. 90 tablet 1  . HYDROcodone-acetaminophen (NORCO) 7.5-325 MG tablet Take 1 tablet by mouth every 6 (six) hours as needed for moderate pain (Must last 30 days.Do not drive or operate machinery while taking this medicine.). 60 tablet 0  . lisinopril-hydrochlorothiazide (PRINZIDE,ZESTORETIC) 20-12.5 MG per tablet Take 2 tablets by mouth daily.    . metFORMIN (GLUCOPHAGE-XR) 500 MG 24 hr tablet Take 500 mg by mouth daily.    [name] warfarin (COUMADIN) 5 MG tablet Take 1 tablet (5 mg total) by mouth daily. Resume on 3/29 30 tablet 0   No current facility-administered medications for this visit.     Past Medical History:  Diagnosis Date  . Acute ischemic stroke ([place]) 07/03/2012  . Crack cocaine use   . Diabetes ([place])   . Diabetes mellitus ([place])   . DVT (deep venous thrombosis) ([place])   . Hemiparesis, acute ([place]) 07/04/2012  . Hypertension   . LVH (left ventricular hypertrophy) 07/04/2012   Ejection fraction 60%.  . Myocardial infarct, old   . Thrombocytopenia ([place]) 07/03/2012  . Tobacco abuse  Past Surgical History:  Procedure Laterality Date  . ABDOMINAL HYSTERECTOMY    . HERNIA REPAIR      Social History   Social History  . Marital status: Widowed    Spouse name: N/A  . Number of children: N/A  . Years of education: N/A   Occupational History  . Not on file.   Social History Main Topics  . Smoking status: Current Every Day Smoker    Packs/day: 1.00    Types: Cigarettes  . Smokeless tobacco: Never Used  . Alcohol use No  . Drug use:  No     Comment: last used 2 years ago  . Sexual activity: No   Other Topics Concern  . Not on file   Social History Narrative  . No narrative on file     No family history of premature CAD in 1st degree relatives.  Current Meds  Medication Sig  . albuterol (PROVENTIL HFA;VENTOLIN HFA) 108 (90 BASE) MCG/ACT inhaler Inhale 2 puffs into the lungs every 4 (four) hours as needed for wheezing or shortness of breath.  . budesonide-formoterol (SYMBICORT) 160-4.5 MCG/ACT inhaler Inhale 2 puffs into the lungs 2 (two) times daily.  . furosemide (LASIX) 20 MG tablet Take 20 mg by mouth.  . gabapentin (NEURONTIN) 300 MG capsule Take 300 mg by mouth 3 (three) times daily.  . hydrALAZINE (APRESOLINE) 25 MG tablet Take 1 tablet (25 mg total) by mouth every 8 (eight) hours.  [name] HYDROcodone-acetaminophen (NORCO) 7.5-325 MG tablet Take 1 tablet by mouth every 6 (six) hours as needed for moderate pain (Must last 30 days.Do not drive or operate machinery while taking this medicine.).  [name] lisinopril-hydrochlorothiazide (PRINZIDE,ZESTORETIC) 20-12.5 MG per tablet Take 2 tablets by mouth daily.  . metFORMIN (GLUCOPHAGE-XR) 500 MG 24 hr tablet Take 500 mg by mouth daily.  [name] warfarin (COUMADIN) 5 MG tablet Take 1 tablet (5 mg total) by mouth daily. Resume on 3/29  . [DISCONTINUED] Fluticasone-Salmeterol (ADVAIR) 250-50 MCG/DOSE AEPB Inhale 1 puff into the lungs 2 (two) times daily.      Review of systems complete and found to be negative unless listed above in HPI    Physical exam Blood pressure 98/60, pulse (!) 103, height 5' 6.5" (1.689 m), weight 246 lb (111.6 kg), SpO2 94 %. General: NAD Neck: No JVD, no thyromegaly or thyroid nodule.  Lungs: Diminished throughout, no crackles or wheezes. CV: Nondisplaced PMI. Regular rate and rhythm, normal S1/S2, no S3/S4, no murmur.  Trace bilateral periankle edema.  No carotid bruit.    Abdomen: Soft, nontender, no distention.  Skin: Intact without lesions or  rashes.  Neurologic: Alert and oriented x 3.  Psych: Normal affect. Extremities: No clubbing or cyanosis.  HEENT: Normal.   ECG: Most recent ECG reviewed.   Labs: Lab Results  Component Value Date/Time   K 3.7 03/24/2015 06:45 AM   BUN 31 (H) 03/24/2015 06:45 AM   CREATININE 1.29 (H) 03/24/2015 06:45 AM   ALT 39 (H) 03/22/2015 04:40 AM   HGB 15.3 (H) 03/23/2015 04:36 AM     Lipids: Lab Results  Component Value Date/Time   LDLCALC 75 07/04/2012 05:23 AM   CHOL 150 07/04/2012 05:23 AM   TRIG 65 07/04/2012 05:23 AM   HDL 62 07/04/2012 05:23 AM        ASSESSMENT AND PLAN:  1. Bilateral leg edema: I suspect this is due to venous insufficiency. Given the fact that her leg swelling is worse in the right leg and this  is the leg she had DVT in, this would stand to reason. She is on Lasix 20 mg daily. Compression stockings and leg elevation artery optimal treatment for leg edema due to venous insufficiency. Her symptoms have essentially resolved. I will obtain lower extremity Dopplers to evaluate for recanalization. With respect to DVT, if she does not have a clotting disorder such as factor V Leiden deficiency or protein C or S deficiency, warfarin can likely be discontinued altogether. I will order a 2-D echocardiogram with Doppler to evaluate cardiac structure, function, and regional wall motion to make certain there is no cardiac dysfunction contributing to this.  2. History of MI: I do not have the details regarding this history. She said there was no obstructive disease found. Given her history of diabetes, statin therapy is still indicated.  3. Hypertension: Blood pressure is low normal. She appears to be asymptomatic.  4. Tobacco abuse: Cessation counseling was provided.  5. Right lower extremity DVT: I will obtain lower extremity Dopplers to evaluate for recanalization. With respect to DVT, if she does not have a clotting disorder such as factor V Leiden deficiency or protein  C or S deficiency, warfarin can likely be discontinued altogether. I will defer to PCP regarding this.     Disposition: Follow up in 3 months  Signed: Kate Sable, M.D., F.A.C.C.  09/16/2017, 10:32 AM

## 2017-09-16 NOTE — Addendum Note (Signed)
Addended by: Barbarann Ehlers A on: 09/16/2017 11:05 AM   Modules accepted: Orders

## 2017-09-18 DIAGNOSIS — L603 Nail dystrophy: Secondary | ICD-10-CM | POA: Diagnosis not present

## 2017-09-18 DIAGNOSIS — L84 Corns and callosities: Secondary | ICD-10-CM | POA: Diagnosis not present

## 2017-09-18 DIAGNOSIS — I739 Peripheral vascular disease, unspecified: Secondary | ICD-10-CM | POA: Diagnosis not present

## 2017-09-18 DIAGNOSIS — E1151 Type 2 diabetes mellitus with diabetic peripheral angiopathy without gangrene: Secondary | ICD-10-CM | POA: Diagnosis not present

## 2017-09-22 ENCOUNTER — Ambulatory Visit (HOSPITAL_COMMUNITY)
Admission: RE | Admit: 2017-09-22 | Discharge: 2017-09-22 | Disposition: A | Discharge status: Home / Self Care | Payer: Medicare Other | Source: Ambulatory Visit | Attending: Cardiovascular Disease | Admitting: Cardiovascular Disease

## 2017-09-22 ENCOUNTER — Ambulatory Visit (HOSPITAL_COMMUNITY)
Admission: RE | Admit: 2017-09-22 | Discharge: 2017-09-22 | Disposition: A | Discharge status: Home / Self Care | Payer: Medicare Other | Source: Ambulatory Visit | Attending: Physician Assistant | Admitting: Physician Assistant

## 2017-09-22 ENCOUNTER — Other Ambulatory Visit (HOSPITAL_COMMUNITY): Payer: Self-pay | Admitting: Physician Assistant

## 2017-09-22 DIAGNOSIS — Z1231 Encounter for screening mammogram for malignant neoplasm of breast: Secondary | ICD-10-CM | POA: Insufficient documentation

## 2017-09-22 DIAGNOSIS — Z86718 Personal history of other venous thrombosis and embolism: Secondary | ICD-10-CM | POA: Diagnosis not present

## 2017-09-22 DIAGNOSIS — Z720 Tobacco use: Secondary | ICD-10-CM | POA: Diagnosis not present

## 2017-09-22 DIAGNOSIS — E119 Type 2 diabetes mellitus without complications: Secondary | ICD-10-CM | POA: Diagnosis not present

## 2017-09-22 DIAGNOSIS — R600 Localized edema: Secondary | ICD-10-CM

## 2017-09-22 DIAGNOSIS — J449 Chronic obstructive pulmonary disease, unspecified: Secondary | ICD-10-CM | POA: Insufficient documentation

## 2017-09-22 DIAGNOSIS — I071 Rheumatic tricuspid insufficiency: Secondary | ICD-10-CM | POA: Diagnosis not present

## 2017-09-22 DIAGNOSIS — I10 Essential (primary) hypertension: Secondary | ICD-10-CM | POA: Diagnosis not present

## 2017-09-22 DIAGNOSIS — I82409 Acute embolism and thrombosis of unspecified deep veins of unspecified lower extremity: Secondary | ICD-10-CM | POA: Diagnosis not present

## 2017-09-22 LAB — ECHOCARDIOGRAM LIMITED
CHL CUP DOP CALC LVOT VTI: 20.1 cm
CHL CUP MV DEC (S): 630
CHL CUP RV SYS PRESS: 50 mmHg
E/e' ratio: 6.04
EWDT: 630 ms
FS: 41 % (ref 28–44)
IV/PV OW: 1.08
LA diam end sys: 40 mm
LA vol index: 21.4 mL/m2
LA vol: 50.1 mL
LADIAMINDEX: 1.71 cm/m2
LASIZE: 40 mm
LAVOLA4C: 52.9 mL
LDCA: 3.8 cm2
LV E/e' medial: 6.04
LV E/e'average: 6.04
LV TDI E'LATERAL: 6.64
LV dias vol: 59 mL (ref 46–106)
LV e' LATERAL: 6.64 cm/s
LVDIAVOLIN: 25 mL/m2
LVOT peak grad rest: 4 mmHg
LVOT peak vel: 104 cm/s
LVOTD: 22 mm
LVOTSV: 76 mL
MV pk E vel: 40.1 m/s
MVPKAVEL: 87.1 m/s
PW: 13.2 mm — AB (ref 0.6–1.1)
RV LATERAL S' VELOCITY: 12.9 cm/s
Reg peak vel: 342 cm/s
TAPSE: 15.5 mm
TDI e' medial: 4.46
TR max vel: 342 cm/s

## 2017-09-22 NOTE — Progress Notes (Signed)
*  PRELIMINARY RESULTS* Echocardiogram 2D Echocardiogram has been performed.  Tracy [name] 09/22/2017, 11:17 AM

## 2017-09-23 DIAGNOSIS — Z7901 Long term (current) use of anticoagulants: Secondary | ICD-10-CM | POA: Diagnosis not present

## 2017-10-07 DIAGNOSIS — Z7901 Long term (current) use of anticoagulants: Secondary | ICD-10-CM | POA: Diagnosis not present

## 2017-10-14 ENCOUNTER — Encounter: Payer: Self-pay | Admitting: Orthopaedic Surgery

## 2017-10-14 ENCOUNTER — Ambulatory Visit (INDEPENDENT_AMBULATORY_CARE_PROVIDER_SITE_OTHER): Payer: Medicare Other | Admitting: Orthopaedic Surgery

## 2017-10-14 VITALS — BP 121/80 | HR 89 | Temp 97.0°F | Ht 65.5 in | Wt 246.0 lb

## 2017-10-14 DIAGNOSIS — F17210 Nicotine dependence, cigarettes, uncomplicated: Secondary | ICD-10-CM | POA: Diagnosis not present

## 2017-10-14 DIAGNOSIS — M25561 Pain in right knee: Secondary | ICD-10-CM

## 2017-10-14 DIAGNOSIS — G8929 Other chronic pain: Secondary | ICD-10-CM | POA: Diagnosis not present

## 2017-10-14 DIAGNOSIS — I10 Essential (primary) hypertension: Secondary | ICD-10-CM | POA: Diagnosis not present

## 2017-10-14 MED ORDER — HYDROCODONE-ACETAMINOPHEN 7.5-325 MG PO TABS: 1.0000 | ORAL_TABLET | Freq: Four times a day (QID) | ORAL | 0 refills | Status: DC | PRN

## 2017-10-14 NOTE — Progress Notes (Signed)
Patient Tracy [name], female DOB:[birth year], 56 y.o. DTO:[id number]  Chief Complaint  Patient presents with  . Knee Pain    right     HPI  Tracy [name] is a 56 y.o. female who has chronic pain of the right knee. She has swelling and popping but no giving way, no redness, no new trauma.  She continues to smoke but has cut back some. HPI  Body mass index is 40.31 kg/m.  ROS  Review of Systems  Constitutional:       Patient has Diabetes Mellitus. Patient has hypertension. Patient has COPD or shortness of breath. Patient has BMI > 35. Patient has current smoking history.  HENT: Negative for congestion.   Respiratory: Positive for cough and shortness of breath.   Cardiovascular: Negative for chest pain.  Endocrine: Positive for cold intolerance.  Musculoskeletal: Positive for arthralgias, gait problem, joint swelling and myalgias.  Allergic/Immunologic: Positive for environmental allergies.  Neurological: Negative for numbness.    Past Medical History:  Diagnosis Date  . Acute ischemic stroke ([place]) 07/03/2012  . Crack cocaine use   . Diabetes ([place])   . Diabetes mellitus ([place])   . DVT (deep venous thrombosis) ([place])   . Hemiparesis, acute ([place]) 07/04/2012  . Hypertension   . LVH (left ventricular hypertrophy) 07/04/2012   Ejection fraction 60%.  . Myocardial infarct, old   . Thrombocytopenia ([place]) 07/03/2012  . Tobacco abuse     Past Surgical History:  Procedure Laterality Date  . ABDOMINAL HYSTERECTOMY    . HERNIA REPAIR      Family History  Problem Relation Age of Onset  . Heart disease Mother   . Diabetes Mother   . Stroke Mother   . Hypertension Mother   . Hypertension Father   . Heart disease Father   . Diabetes Brother     Social History Social History  Substance Use Topics  . Smoking status: Current Every Day Smoker    Packs/day: 1.00    Types: Cigarettes  . Smokeless tobacco: Never Used  . Alcohol use No    No Known  Allergies  Current Outpatient Prescriptions  Medication Sig Dispense Refill  . albuterol (PROVENTIL HFA;VENTOLIN HFA) 108 (90 BASE) MCG/ACT inhaler Inhale 2 puffs into the lungs every 4 (four) hours as needed for wheezing or shortness of breath. 1 Inhaler 0  . budesonide-formoterol (SYMBICORT) 160-4.5 MCG/ACT inhaler Inhale 2 puffs into the lungs 2 (two) times daily.    . furosemide (LASIX) 20 MG tablet Take 20 mg by mouth.    . gabapentin (NEURONTIN) 300 MG capsule Take 300 mg by mouth 3 (three) times daily.    . hydrALAZINE (APRESOLINE) 25 MG tablet Take 1 tablet (25 mg total) by mouth every 8 (eight) hours. 90 tablet 1  . HYDROcodone-acetaminophen (NORCO) 7.5-325 MG tablet Take 1 tablet by mouth every 6 (six) hours as needed for moderate pain (Must last 30 days.Do not drive or operate machinery while taking this medicine.). 60 tablet 0  . lisinopril-hydrochlorothiazide (PRINZIDE,ZESTORETIC) 20-12.5 MG per tablet Take 2 tablets by mouth daily.    . metFORMIN (GLUCOPHAGE-XR) 500 MG 24 hr tablet Take 500 mg by mouth daily.    [name] warfarin (COUMADIN) 5 MG tablet Take 1 tablet (5 mg total) by mouth daily. Resume on 3/29 30 tablet 0   No current facility-administered medications for this visit.      Physical Exam  Blood pressure 121/80, pulse 89, temperature (!) 97 F (36.1 C), height 5' 5.5" (1.664 m), weight  246 lb (111.6 kg).  Constitutional: overall normal hygiene, normal nutrition, well developed, normal grooming, normal body habitus. Assistive device:none  Musculoskeletal: gait and station Limp right, muscle tone and strength are normal, no tremors or atrophy is present.  .  Neurological: coordination overall normal.  Deep tendon reflex/nerve stretch intact.  Sensation normal.  Cranial nerves II-XII intact.   Skin:   Normal overall no scars, lesions, ulcers or rashes. No psoriasis.  Psychiatric: Alert and oriented x 3.  Recent memory intact, remote memory unclear.  Normal mood and  affect. Well groomed.  Good eye contact.  Cardiovascular: overall no swelling, no varicosities, no edema bilaterally, normal temperatures of the legs and arms, no clubbing, cyanosis and good capillary refill.  Lymphatic: palpation is normal.  All other systems reviewed and are negative   The right lower extremity is examined:  Inspection:  Thigh:  Non-tender and no defects  Knee has swelling 1+ effusion.                        Joint tenderness is present                        Patient is tender over the medial joint line  Lower Leg:  Has normal appearance and no tenderness or defects  Ankle:  Non-tender and no defects  Foot:  Non-tender and no defects Range of Motion:  Knee:  Range of motion is: 0-105                        Crepitus is  present  Ankle:  Range of motion is normal. Strength and Tone:  The right lower extremity has normal strength and tone. Stability:  Knee:  The knee is stable.  Ankle:  The ankle is stable.   The patient has been educated about the nature of the problem(s) and counseled on treatment options.  The patient appeared to understand what I have discussed and is in agreement with it.  Encounter Diagnoses  Name Primary?  . Chronic pain of right knee Yes  . Essential hypertension   . Cigarette nicotine dependence without complication     PLAN Call if any problems.  Precautions discussed.  Continue current medications.   Return to clinic 3 months   I have reviewed the [place] web site prior to prescribing narcotic medicine for this patient.  Electronically Signed Sanjuana Kava, MD 10/16/20189:44 AM

## 2017-10-14 NOTE — Patient Instructions (Signed)
Health Risks of Smoking  Smoking cigarettes is very bad for your health. Tobacco smoke has over 200 known poisons in it. It contains the poisonous gases nitrogen oxide and carbon monoxide. There are over 60 chemicals in tobacco smoke that cause cancer.  Smoking is difficult to quit because a chemical in tobacco, called nicotine, causes addiction or dependence. When you smoke and inhale, nicotine is absorbed rapidly into the bloodstream through your lungs. Both inhaled and non-inhaled nicotine may be addictive.  What are the risks of cigarette smoke?  Cigarette smokers have an increased risk of many serious medical problems, including:  · Lung cancer.  · Lung disease, such as pneumonia, bronchitis, and emphysema.  · Chest pain (angina) and heart attack because the heart is not getting enough oxygen.  · Heart disease and peripheral blood vessel disease.  · High blood pressure (hypertension).  · Stroke.  · Oral cancer, including cancer of the lip, mouth, or voice box.  · Bladder cancer.  · Pancreatic cancer.  · Cervical cancer.  · Pregnancy complications, including premature birth.  · Stillbirths and smaller newborn babies, birth defects, and genetic damage to sperm.  · Early menopause.  · Lower estrogen level for women.  · Infertility.  · Facial wrinkles.  · Blindness.  · Increased risk of broken bones (fractures).  · Senile dementia.  · Stomach ulcers and internal bleeding.  · Delayed wound healing and increased risk of complications during surgery.  · Even smoking lightly shortens your life expectancy by several years.    Because of secondhand smoke exposure, children of smokers have an increased risk of the following:  · Sudden infant death syndrome (SIDS).  · Respiratory infections.  · Lung cancer.  · Heart disease.  · Ear infections.    What are the benefits of quitting?  There are many health benefits of quitting smoking. Here are some of them:   · Within days of quitting smoking, your risk of having a heart attack decreases, your blood flow improves, and your lung capacity improves. Blood pressure, pulse rate, and breathing patterns start returning to normal soon after quitting.  · Within months, your lungs may clear up completely.  · Quitting for 10 years reduces your risk of developing lung cancer and heart disease to almost that of a nonsmoker.  · People who quit may see an improvement in their overall quality of life.    How do I quit smoking?  Smoking is an addiction with both physical and psychological effects, and longtime habits can be hard to change. Your health care provider can recommend:  · Programs and community resources, which may include group support, education, or talk therapy.  · Prescription medicines to help reduce cravings.  · Nicotine replacement products, such as patches, gum, and nasal sprays. Use these products only as directed. Do not replace cigarette smoking with electronic cigarettes, which are commonly called e-cigarettes. The safety of e-cigarettes is not known, and some may contain harmful chemicals.  · A combination of two or more of these methods.    Where to find more information:  · American Lung Association: www.lung.org  · American Cancer Society: www.cancer.org  Summary  · Smoking cigarettes is very bad for your health. Cigarette smokers have an increased risk of many serious medical problems, including several cancers, heart disease, and stroke.  · Smoking is an addiction with both physical and psychological effects, and longtime habits can be hard to change.  · By stopping right away, you   can greatly reduce the risk of medical problems for you and your family.  · To help you quit smoking, your health care provider can recommend programs, community resources, prescription medicines, and nicotine replacement products such as patches, gum, and nasal sprays.   This information is not intended to replace advice given to you by your health care provider. Make sure you discuss any questions you have with your health care provider.  Document Released: 01/23/2005 Document Revised: 12/20/2016 Document Reviewed: 12/20/2016  Elsevier Interactive Patient Education © 2017 [employer] Inc.

## 2017-11-12 ENCOUNTER — Telehealth: Payer: Self-pay | Admitting: Orthopaedic Surgery

## 2017-11-12 MED ORDER — HYDROCODONE-ACETAMINOPHEN 7.5-325 MG PO TABS: 1.0000 | ORAL_TABLET | Freq: Four times a day (QID) | ORAL | 0 refills | Status: DC | PRN

## 2017-11-12 NOTE — Telephone Encounter (Signed)
Hydrocodone-Acetaminophen  7.5/325 mg    Qty 60 Tablets

## 2017-11-12 NOTE — Addendum Note (Signed)
Addended by: Willette Pa on: 11/12/2017 11:48 AM   Modules accepted: Orders

## 2017-11-18 DIAGNOSIS — Z7901 Long term (current) use of anticoagulants: Secondary | ICD-10-CM | POA: Diagnosis not present

## 2017-12-02 DIAGNOSIS — J449 Chronic obstructive pulmonary disease, unspecified: Secondary | ICD-10-CM | POA: Diagnosis not present

## 2017-12-02 DIAGNOSIS — E1165 Type 2 diabetes mellitus with hyperglycemia: Secondary | ICD-10-CM | POA: Diagnosis not present

## 2017-12-02 DIAGNOSIS — I10 Essential (primary) hypertension: Secondary | ICD-10-CM | POA: Diagnosis not present

## 2017-12-02 DIAGNOSIS — Z720 Tobacco use: Secondary | ICD-10-CM | POA: Diagnosis not present

## 2017-12-05 DIAGNOSIS — E119 Type 2 diabetes mellitus without complications: Secondary | ICD-10-CM | POA: Diagnosis not present

## 2017-12-05 DIAGNOSIS — H40003 Preglaucoma, unspecified, bilateral: Secondary | ICD-10-CM | POA: Diagnosis not present

## 2017-12-05 DIAGNOSIS — I4510 Unspecified right bundle-branch block: Secondary | ICD-10-CM | POA: Diagnosis not present

## 2017-12-05 DIAGNOSIS — H40013 Open angle with borderline findings, low risk, bilateral: Secondary | ICD-10-CM | POA: Diagnosis not present

## 2017-12-05 DIAGNOSIS — E1165 Type 2 diabetes mellitus with hyperglycemia: Secondary | ICD-10-CM | POA: Diagnosis not present

## 2017-12-05 DIAGNOSIS — I10 Essential (primary) hypertension: Secondary | ICD-10-CM | POA: Diagnosis not present

## 2017-12-05 DIAGNOSIS — E782 Mixed hyperlipidemia: Secondary | ICD-10-CM | POA: Diagnosis not present

## 2017-12-11 ENCOUNTER — Telehealth: Payer: Self-pay | Admitting: Orthopaedic Surgery

## 2017-12-11 DIAGNOSIS — I739 Peripheral vascular disease, unspecified: Secondary | ICD-10-CM | POA: Diagnosis not present

## 2017-12-11 DIAGNOSIS — E1151 Type 2 diabetes mellitus with diabetic peripheral angiopathy without gangrene: Secondary | ICD-10-CM | POA: Diagnosis not present

## 2017-12-11 MED ORDER — HYDROCODONE-ACETAMINOPHEN 7.5-325 MG PO TABS: 1.0000 | ORAL_TABLET | Freq: Four times a day (QID) | ORAL | 0 refills | Status: DC | PRN

## 2017-12-11 NOTE — Telephone Encounter (Signed)
Patient requests refill on Hydrocodone/Acetaminophen 7.5-325  Mgs.   Qty  60   Sig: Take 1 tablet every 6 (six) hours as needed by mouth for moderate pain (Must last 30 days.Do not drive or operate machinery while taking this medicine.).  Patient uses [facility] in [place]

## 2017-12-18 DIAGNOSIS — I10 Essential (primary) hypertension: Secondary | ICD-10-CM | POA: Diagnosis not present

## 2017-12-18 DIAGNOSIS — I80299 Phlebitis and thrombophlebitis of other deep vessels of unspecified lower extremity: Secondary | ICD-10-CM | POA: Diagnosis not present

## 2017-12-22 ENCOUNTER — Other Ambulatory Visit (HOSPITAL_COMMUNITY)
Admission: RE | Admit: 2017-12-22 | Discharge: 2017-12-22 | Disposition: A | Discharge status: Home / Self Care | Payer: Medicare Other | Source: Ambulatory Visit | Attending: Family Medicine | Admitting: Family Medicine

## 2017-12-22 DIAGNOSIS — I82409 Acute embolism and thrombosis of unspecified deep veins of unspecified lower extremity: Secondary | ICD-10-CM | POA: Insufficient documentation

## 2017-12-22 LAB — ANTITHROMBIN III: AntiThromb III Func: 116 % (ref 75–120)

## 2017-12-24 LAB — PROTEIN C ACTIVITY: PROTEIN C ACTIVITY: 50 % — AB (ref 73–180)

## 2017-12-24 LAB — PROTEIN S, TOTAL: PROTEIN S AG TOTAL: 44 % — AB (ref 60–150)

## 2017-12-24 LAB — PROTEIN S ACTIVITY: Protein S Activity: 16 % — ABNORMAL LOW (ref 63–140)

## 2017-12-24 LAB — HOMOCYSTEINE: Homocysteine: 29.1 umol/L — ABNORMAL HIGH (ref 0.0–15.0)

## 2017-12-25 LAB — BETA-2-GLYCOPROTEIN I ABS, IGG/M/A
Beta-2-Glycoprotein I IgA: <9 GPI IgA units (ref 0–25)
Beta-2-Glycoprotein I IgM: <9 GPI IgM units (ref 0–32)

## 2017-12-25 LAB — CARDIOLIPIN ANTIBODIES, IGG, IGM, IGA: Anticardiolipin IgG: <9 GPL U/mL (ref 0–14)

## 2017-12-25 LAB — PROTEIN C, TOTAL: PROTEIN C, TOTAL: 53 % — AB (ref 60–150)

## 2017-12-26 LAB — FACTOR 5 LEIDEN

## 2017-12-27 LAB — DRVVT MIX: dRVVT Mix: 48.5 s — ABNORMAL HIGH (ref 0.0–47.0)

## 2017-12-27 LAB — LUPUS ANTICOAGULANT PANEL
DRVVT: 131.9 s — ABNORMAL HIGH (ref 0.0–47.0)
PTT Lupus Anticoagulant: 41.9 s (ref 0.0–51.9)

## 2017-12-27 LAB — DRVVT CONFIRM: dRVVT Confirm: 1.8 ratio — ABNORMAL HIGH (ref 0.8–1.2)

## 2017-12-29 LAB — PROTHROMBIN GENE MUTATION

## 2018-01-01 DIAGNOSIS — Z7901 Long term (current) use of anticoagulants: Secondary | ICD-10-CM | POA: Diagnosis not present

## 2018-01-02 ENCOUNTER — Ambulatory Visit: Payer: Medicare Other | Admitting: Cardiovascular Disease

## 2018-01-06 DIAGNOSIS — H5711 Ocular pain, right eye: Secondary | ICD-10-CM | POA: Diagnosis not present

## 2018-01-08 DIAGNOSIS — H5711 Ocular pain, right eye: Secondary | ICD-10-CM | POA: Diagnosis not present

## 2018-01-14 ENCOUNTER — Encounter: Payer: Self-pay | Admitting: Orthopaedic Surgery

## 2018-01-14 ENCOUNTER — Ambulatory Visit (INDEPENDENT_AMBULATORY_CARE_PROVIDER_SITE_OTHER): Payer: Medicare Other | Admitting: Orthopaedic Surgery

## 2018-01-14 VITALS — BP 133/99 | HR 93 | Ht 65.5 in | Wt 247.0 lb

## 2018-01-14 DIAGNOSIS — I10 Essential (primary) hypertension: Secondary | ICD-10-CM

## 2018-01-14 DIAGNOSIS — M25561 Pain in right knee: Secondary | ICD-10-CM | POA: Diagnosis not present

## 2018-01-14 DIAGNOSIS — G8929 Other chronic pain: Secondary | ICD-10-CM

## 2018-01-14 DIAGNOSIS — F17210 Nicotine dependence, cigarettes, uncomplicated: Secondary | ICD-10-CM | POA: Diagnosis not present

## 2018-01-14 MED ORDER — HYDROCODONE-ACETAMINOPHEN 7.5-325 MG PO TABS: 1.0000 | ORAL_TABLET | Freq: Four times a day (QID) | ORAL | 0 refills | Status: DC | PRN

## 2018-01-14 NOTE — Progress Notes (Signed)
Patient Tracy [name], female DOB:1961/06/18, 57 y.o. EVO:[id number]  Chief Complaint  Patient presents with  . Follow-up    Right knee    HPI  Tracy [name] is a 57 y.o. female who has chronic right knee pain. She has swelling, popping but no giving way, no locking, no new trauma.  She is active and taking her medicine.  She has had more pain recently with the cold weather.  She says some days she can hardly get along. HPI  Body mass index is 40.48 kg/m.  ROS  Review of Systems  Constitutional:       Patient has Diabetes Mellitus. Patient has hypertension. Patient has COPD or shortness of breath. Patient has BMI > 35. Patient has current smoking history.  HENT: Negative for congestion.   Respiratory: Positive for cough and shortness of breath.   Cardiovascular: Negative for chest pain.  Endocrine: Positive for cold intolerance.  Musculoskeletal: Positive for arthralgias, gait problem, joint swelling and myalgias.  Allergic/Immunologic: Positive for environmental allergies.  Neurological: Negative for numbness.  All other systems reviewed and are negative.   Past Medical History:  Diagnosis Date  . Acute ischemic stroke ([place]) 07/03/2012  . Crack cocaine use   . Diabetes ([place])   . Diabetes mellitus ([place])   . DVT (deep venous thrombosis) ([place])   . Hemiparesis, acute ([place]) 07/04/2012  . Hypertension   . LVH (left ventricular hypertrophy) 07/04/2012   Ejection fraction 60%.  . Myocardial infarct, old   . Thrombocytopenia ([place]) 07/03/2012  . Tobacco abuse     Past Surgical History:  Procedure Laterality Date  . ABDOMINAL HYSTERECTOMY    . HERNIA REPAIR      Family History  Problem Relation Age of Onset  . Heart disease Mother   . Diabetes Mother   . Stroke Mother   . Hypertension Mother   . Hypertension Father   . Heart disease Father   . Diabetes Brother     Social History Social History   Tobacco Use  . Smoking status: Current Every Day  Smoker    Packs/day: 1.00    Types: Cigarettes  . Smokeless tobacco: Never Used  Substance Use Topics  . Alcohol use: No  . Drug use: No    Comment: last used 2 years ago    No Known Allergies  Current Outpatient Medications  Medication Sig Dispense Refill  . albuterol (PROVENTIL HFA;VENTOLIN HFA) 108 (90 BASE) MCG/ACT inhaler Inhale 2 puffs into the lungs every 4 (four) hours as needed for wheezing or shortness of breath. 1 Inhaler 0  . budesonide-formoterol (SYMBICORT) 160-4.5 MCG/ACT inhaler Inhale 2 puffs into the lungs 2 (two) times daily.    . furosemide (LASIX) 20 MG tablet Take 20 mg by mouth.    . gabapentin (NEURONTIN) 300 MG capsule Take 300 mg by mouth 3 (three) times daily.    . hydrALAZINE (APRESOLINE) 25 MG tablet Take 1 tablet (25 mg total) by mouth every 8 (eight) hours. 90 tablet 1  . HYDROcodone-acetaminophen (NORCO) 7.5-325 MG tablet Take 1 tablet by mouth every 6 (six) hours as needed for moderate pain (Must last 30 days.Do not drive or operate machinery while taking this medicine.). 60 tablet 0  . lisinopril-hydrochlorothiazide (PRINZIDE,ZESTORETIC) 20-12.5 MG per tablet Take 2 tablets by mouth daily.    . metFORMIN (GLUCOPHAGE-XR) 500 MG 24 hr tablet Take 500 mg by mouth daily.    [name] warfarin (COUMADIN) 5 MG tablet Take 1 tablet (5 mg total) by mouth daily.  Resume on 3/29 30 tablet 0   No current facility-administered medications for this visit.      Physical Exam  Blood pressure (!) 133/99, pulse 93, height 5' 5.5" (1.664 m), weight 247 lb (112 kg).  Constitutional: overall normal hygiene, normal nutrition, well developed, normal grooming, normal body habitus. Assistive device:cane  Musculoskeletal: gait and station Limp right, muscle tone and strength are normal, no tremors or atrophy is present.  .  Neurological: coordination overall normal.  Deep tendon reflex/nerve stretch intact.  Sensation normal.  Cranial nerves II-XII intact.   Skin:   Normal  overall no scars, lesions, ulcers or rashes. No psoriasis.  Psychiatric: Alert and oriented x 3.  Recent memory intact, remote memory unclear.  Normal mood and affect. Well groomed.  Good eye contact.  Cardiovascular: overall no swelling, no varicosities, no edema bilaterally, normal temperatures of the legs and arms, no clubbing, cyanosis and good capillary refill.  Lymphatic: palpation is normal.  The right lower extremity is examined:  Inspection:  Thigh:  Non-tender and no defects  Knee has swelling 1+ effusion.                        Joint tenderness is present                        Patient is tender over the medial joint line  Lower Leg:  Has normal appearance and no tenderness or defects  Ankle:  Non-tender and no defects  Foot:  Non-tender and no defects Range of Motion:  Knee:  Range of motion is: 0-100                        Crepitus is  present  Ankle:  Range of motion is normal. Strength and Tone:  The right lower extremity has normal strength and tone. Stability:  Knee:  The knee is stable.  Ankle:  The ankle is stable.   All other systems reviewed and are negative   The patient has been educated about the nature of the problem(s) and counseled on treatment options.  The patient appeared to understand what I have discussed and is in agreement with it.  Encounter Diagnoses  Name Primary?  . Chronic pain of right knee Yes  . Essential hypertension   . Cigarette nicotine dependence without complication     PLAN Call if any problems.  Precautions discussed.  Continue current medications.   Return to clinic 4 months   Electronically Signed Sanjuana Kava, MD 1/16/20199:50 AM

## 2018-01-15 DIAGNOSIS — H5711 Ocular pain, right eye: Secondary | ICD-10-CM | POA: Diagnosis not present

## 2018-01-28 ENCOUNTER — Encounter: Payer: Self-pay | Admitting: Cardiovascular Disease

## 2018-01-28 ENCOUNTER — Ambulatory Visit (INDEPENDENT_AMBULATORY_CARE_PROVIDER_SITE_OTHER): Payer: Medicare Other | Admitting: Cardiovascular Disease

## 2018-01-28 VITALS — BP 128/68 | HR 97 | Ht 66.0 in | Wt 249.0 lb

## 2018-01-28 DIAGNOSIS — R600 Localized edema: Secondary | ICD-10-CM

## 2018-01-28 DIAGNOSIS — I252 Old myocardial infarction: Secondary | ICD-10-CM | POA: Diagnosis not present

## 2018-01-28 DIAGNOSIS — Z86718 Personal history of other venous thrombosis and embolism: Secondary | ICD-10-CM

## 2018-01-28 DIAGNOSIS — Z716 Tobacco abuse counseling: Secondary | ICD-10-CM

## 2018-01-28 DIAGNOSIS — I10 Essential (primary) hypertension: Secondary | ICD-10-CM

## 2018-01-28 DIAGNOSIS — I872 Venous insufficiency (chronic) (peripheral): Secondary | ICD-10-CM

## 2018-01-28 DIAGNOSIS — D689 Coagulation defect, unspecified: Secondary | ICD-10-CM | POA: Diagnosis not present

## 2018-01-28 NOTE — Patient Instructions (Addendum)
Your physician wants you to follow-up in: as needed with Dr.Koneswaran      No change in medications.    No tests or lab work ordered today     Thank you for choosing [facility] !

## 2018-01-28 NOTE — Progress Notes (Signed)
SUBJECTIVE: The patient presents for follow-up of bilateral leg edema with history of right lower extremity DVT. Lower extremity Dopplers on 09/22/17 demonstrated no evidence of acute or chronic DVT within either lower extremity.  Echocardiogram demonstrated vigorous left ventricular systolic function, LVEF 23-76%, normal regional wall motion, moderate LVH, grade 1 diastolic dysfunction, mild to moderate right ventricular dilatation with moderately elevated pulmonary pressures, 50 mmHg.  She is feeling well.  Her leg swelling has resolved and she takes low-dose Lasix daily.  She said she has cut back on her smoking.  She had a plethora of labs checked for clotting disorders on 12/22/17 which I reviewed with both the patient and her daughter.  Factor V Leiden, anticardiolipin IgG, anticardiolipin IgM, and prothrombin gene mutations were all normal.  She did have a positive lupus anticoagulant.  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  Antithrombin III was normal.   Review of Systems: As per "subjective", otherwise negative.  No Known Allergies  Current Outpatient Medications  Medication Sig Dispense Refill  . albuterol (PROVENTIL HFA;VENTOLIN HFA) 108 (90 BASE) MCG/ACT inhaler Inhale 2 puffs into the lungs every 4 (four) hours as needed for wheezing or shortness of breath. 1 Inhaler 0  . atorvastatin (LIPITOR) 10 MG tablet Take 10 mg by mouth daily.     . budesonide-formoterol (SYMBICORT) 160-4.5 MCG/ACT inhaler Inhale 2 puffs into the lungs 2 (two) times daily.    . furosemide (LASIX) 20 MG tablet Take 20 mg by mouth.    . gabapentin (NEURONTIN) 300 MG capsule Take 300 mg by mouth 3 (three) times daily.    . hydrALAZINE (APRESOLINE) 25 MG tablet Take 1 tablet (25 mg total) by mouth every 8 (eight) hours. 90 tablet 1  . HYDROcodone-acetaminophen (NORCO) 7.5-325 MG tablet Take 1 tablet by mouth every 6 (six) hours as needed for moderate pain (Must last 30 days.Do not  drive or operate machinery while taking this medicine.). 60 tablet 0  . JARDIANCE 25 MG TABS tablet 25 mg daily.     [name] lisinopril-hydrochlorothiazide (PRINZIDE,ZESTORETIC) 20-12.5 MG per tablet Take 2 tablets by mouth daily.    . metFORMIN (GLUCOPHAGE-XR) 500 MG 24 hr tablet Take 500 mg by mouth daily.    . metoprolol succinate (TOPROL-XL) 25 MG 24 hr tablet Take 25 mg by mouth daily.     [name] warfarin (COUMADIN) 5 MG tablet Take 1 tablet (5 mg total) by mouth daily. Resume on 3/29 30 tablet 0   No current facility-administered medications for this visit.     Past Medical History:  Diagnosis Date  . Acute ischemic stroke ([place]) 07/03/2012  . Crack cocaine use   . Diabetes ([place])   . Diabetes mellitus ([place])   . DVT (deep venous thrombosis) ([place])   . Hemiparesis, acute ([place]) 07/04/2012  . Hypertension   . LVH (left ventricular hypertrophy) 07/04/2012   Ejection fraction 60%.  . Myocardial infarct, old   . Thrombocytopenia ([place]) 07/03/2012  . Tobacco abuse     Past Surgical History:  Procedure Laterality Date  . ABDOMINAL HYSTERECTOMY    . HERNIA REPAIR      Social History   Socioeconomic History  . Marital status: Widowed    Spouse name: Not on file  . Number of children: Not on file  . Years of education: Not on file  . Highest education level: Not on file  Social Needs  . Financial resource strain: Not on file  . Food  insecurity - worry: Not on file  . Food insecurity - inability: Not on file  . Transportation needs - medical: Not on file  . Transportation needs - non-medical: Not on file  Occupational History  . Not on file  Tobacco Use  . Smoking status: Current Every Day Smoker    Packs/day: 1.00    Types: Cigarettes  . Smokeless tobacco: Never Used  Substance and Sexual Activity  . Alcohol use: No  . Drug use: No    Comment: last used 2 years ago  . Sexual activity: No    Birth control/protection: Surgical  Other Topics Concern  . Not on file  Social History  Narrative  . Not on file     Vitals:   01/28/18 0928  BP: 128/68  Pulse: 97  SpO2: 91%  Weight: 249 lb (112.9 kg)  Height: 5\' 6"  (1.676 m)    Wt Readings from Last 3 Encounters:  01/28/18 249 lb (112.9 kg)  01/14/18 247 lb (112 kg)  10/14/17 246 lb (111.6 kg)     PHYSICAL EXAM General: NAD HEENT: Normal. Neck: No JVD, no thyromegaly. Lungs: Diminished throughout, no crackles or wheezes CV: Regular rate and rhythm, normal S1/S2, no S3/S4, no murmur.  Trace bilateral peri-ankle edema.  Abdomen: Soft, nontender, no distention.  Neurologic: Alert and oriented.  Psych: Normal affect. Skin: Normal. Musculoskeletal: No gross deformities.    ECG: Most recent ECG reviewed.   Labs: Lab Results  Component Value Date/Time   K 3.7 03/24/2015 06:45 AM   BUN 31 (H) 03/24/2015 06:45 AM   CREATININE 1.29 (H) 03/24/2015 06:45 AM   ALT 39 (H) 03/22/2015 04:40 AM   HGB 15.3 (H) 03/23/2015 04:36 AM     Lipids: Lab Results  Component Value Date/Time   LDLCALC 75 07/04/2012 05:23 AM   CHOL 150 07/04/2012 05:23 AM   TRIG 65 07/04/2012 05:23 AM   HDL 62 07/04/2012 05:23 AM       ASSESSMENT AND PLAN:  1. Bilateral leg edema: I suspect this is due to venous insufficiency. Given the fact that her leg swelling is worse in the right leg and this is the leg she had DVT in, this would stand to reason. She is on Lasix 20 mg daily. Compression stockings and leg elevation are the optimal treatment for leg edema due to venous insufficiency. Her symptoms have essentially resolved. Lower extremity Dopplers demonstrated no evidence of acute or chronic DVT within either extremity as detailed above.   Given deficiencies of protein C and protein S with positive lupus anticoagulant, she will require lifelong anticoagulation due to elevated risk of thromboembolic disease.  I offered starting a direct oral anticoagulant such as Xarelto or Eliquis but she has been on warfarin for quite some time and  prefers to remain on this.  I will defer management to her PCP.  2. History of MI: Symptomatically stable. I do not have the details regarding this history. She said there was no obstructive disease found. Given her history of diabetes, statin therapy is still indicated.  Left ventricular systolic function and regional wall motion are normal.  3. Hypertension: Controlled.  No changes to therapy.  4. Tobacco abuse: Cessation counseling was provided.  5. Right lower extremity DVT: Lower extremity Dopplers demonstrated no evidence of acute or chronic DVT within either extremity as detailed above.  Given deficiencies of protein C and protein S with positive lupus anticoagulant, she will require lifelong anticoagulation due to elevated risk of thromboembolic  disease.  I offered starting a direct oral anticoagulant such as Xarelto or Eliquis but she has been on warfarin for quite some time and prefers to remain on this.  I will defer management to her PCP.  6.  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  Oxygen saturations are 91% today. Right ventricular systolic function was low normal.  She has no evidence of right heart failure.  I discussed tobacco cessation with her.  7.  Tobacco abuse: Cessation counseling provided (3 minutes).    Disposition: Follow up as needed   Kate Sable, M.D., F.A.C.C.

## 2018-02-02 DIAGNOSIS — Z7901 Long term (current) use of anticoagulants: Secondary | ICD-10-CM | POA: Diagnosis not present

## 2018-02-02 DIAGNOSIS — R0789 Other chest pain: Secondary | ICD-10-CM | POA: Diagnosis not present

## 2018-02-02 DIAGNOSIS — E119 Type 2 diabetes mellitus without complications: Secondary | ICD-10-CM | POA: Diagnosis not present

## 2018-02-02 DIAGNOSIS — R079 Chest pain, unspecified: Secondary | ICD-10-CM | POA: Diagnosis not present

## 2018-02-02 DIAGNOSIS — R0602 Shortness of breath: Secondary | ICD-10-CM | POA: Diagnosis not present

## 2018-02-02 DIAGNOSIS — Z7984 Long term (current) use of oral hypoglycemic drugs: Secondary | ICD-10-CM | POA: Diagnosis not present

## 2018-02-02 DIAGNOSIS — I252 Old myocardial infarction: Secondary | ICD-10-CM | POA: Diagnosis not present

## 2018-02-02 DIAGNOSIS — Z5321 Procedure and treatment not carried out due to patient leaving prior to being seen by health care provider: Secondary | ICD-10-CM | POA: Diagnosis not present

## 2018-02-02 DIAGNOSIS — Z86718 Personal history of other venous thrombosis and embolism: Secondary | ICD-10-CM | POA: Diagnosis not present

## 2018-02-02 DIAGNOSIS — Z79899 Other long term (current) drug therapy: Secondary | ICD-10-CM | POA: Diagnosis not present

## 2018-02-02 DIAGNOSIS — I10 Essential (primary) hypertension: Secondary | ICD-10-CM | POA: Diagnosis not present

## 2018-02-02 DIAGNOSIS — Z7951 Long term (current) use of inhaled steroids: Secondary | ICD-10-CM | POA: Diagnosis not present

## 2018-02-02 DIAGNOSIS — D519 Vitamin B12 deficiency anemia, unspecified: Secondary | ICD-10-CM | POA: Diagnosis not present

## 2018-02-06 DIAGNOSIS — H20011 Primary iridocyclitis, right eye: Secondary | ICD-10-CM | POA: Diagnosis not present

## 2018-02-12 ENCOUNTER — Telehealth: Payer: Self-pay | Admitting: Orthopaedic Surgery

## 2018-02-12 MED ORDER — HYDROCODONE-ACETAMINOPHEN 7.5-325 MG PO TABS: 1.0000 | ORAL_TABLET | Freq: Four times a day (QID) | ORAL | 0 refills | Status: DC | PRN

## 2018-02-12 NOTE — Telephone Encounter (Signed)
Patient requests refill on Hydrocodone/Acetaminophen 7.5-325  Mgs.   Qty  60  Sig: Take 1 tablet by mouth every 6 (six) hours as needed for moderate pain (Must last 30 days.Do not drive or operate machinery while taking this medicine.).  Patient states she uses [facility] in [place]

## 2018-02-17 ENCOUNTER — Encounter: Payer: Self-pay | Admitting: Student

## 2018-02-17 ENCOUNTER — Ambulatory Visit (INDEPENDENT_AMBULATORY_CARE_PROVIDER_SITE_OTHER): Payer: Medicare Other | Admitting: Student

## 2018-02-17 ENCOUNTER — Encounter: Payer: Self-pay | Admitting: *Deleted

## 2018-02-17 VITALS — BP 104/70 | HR 94 | Ht 67.0 in | Wt 252.0 lb

## 2018-02-17 DIAGNOSIS — R079 Chest pain, unspecified: Secondary | ICD-10-CM

## 2018-02-17 DIAGNOSIS — Z720 Tobacco use: Secondary | ICD-10-CM

## 2018-02-17 DIAGNOSIS — D689 Coagulation defect, unspecified: Secondary | ICD-10-CM | POA: Diagnosis not present

## 2018-02-17 DIAGNOSIS — E118 Type 2 diabetes mellitus with unspecified complications: Secondary | ICD-10-CM

## 2018-02-17 DIAGNOSIS — E782 Mixed hyperlipidemia: Secondary | ICD-10-CM | POA: Diagnosis not present

## 2018-02-17 DIAGNOSIS — I10 Essential (primary) hypertension: Secondary | ICD-10-CM | POA: Diagnosis not present

## 2018-02-17 NOTE — Patient Instructions (Signed)
Medication Instructions:  Your physician recommends that you continue on your current medications as directed. Please refer to the Current Medication list given to you today.   Labwork: NONE   Testing/Procedures: Your physician has requested that you have a lexiscan myoview. For further information please visit [email or web address]. Please follow instruction sheet, as given.    Follow-Up: Your physician recommends that you schedule a follow-up appointment in: 2 Months    Any Other Special Instructions Will Be Listed Below (If Applicable).     If you need a refill on your cardiac medications before your next appointment, please call your pharmacy.  Thank you for choosing [facility]!

## 2018-02-17 NOTE — Progress Notes (Signed)
Cardiology Office Note    Date:  02/17/2018   ID:  Tracy [name], DOB 07/04/1961, MRN [id number]  PCP:  Rory Percy, MD  Cardiologist: Kate Sable, MD    Chief Complaint  Patient presents with  . Follow-up    recent Emergency Dept visit    History of Present Illness:    Tracy [name] is a 57 y.o. female with past medical history of reported CAD (no records available for review), HTN, HLD, Type 2 DM, history of DVT, and tobacco use who presents to the office today for follow-up.   His last examined by Dr. Bronson Ing on 01/28/2018 for swelling along her right lower extremity and recent Doppler showing no evidence of a DVT.  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 She was continued on Coumadin and was not interested in a NOAC.   The patient reports she was recently at her PCP's office on 02/02/2018 for lab work and she developed a discomfort in her chest at that time. She reports they performed an EKG and said it was "abnormal" and she was rushed to [facility] via EMS for further evaluation. Records were received following her visit today and are reviewed. This shows that she was sent from [facility] due to having a right bundle branch block which is known when compared to her prior EKG's. Labs were checked and showed WBC 11.4, Hgb 16.6, platelets 154, Na+ 142, K+ 3.8, and creatinine 1.06.  Her initial troponin was less than 0.01 and BNP was at 129.5. D-dimer was negative. It appears that they recommended she be admitted for overnight observation and possible stress test but she reported feeling better and wanted to go home, therefore signing [place] paperwork.  In talking with the patient today, she does describe occasional episodes of chest discomfort which occurs at rest. She is not  overly active at baseline but denies any exertional chest pain or dyspnea on exertion.  Reports her episodes of pain can last for 20 minutes up to several hours. No associated radiating pain along her neck or arms. No nausea, vomiting, or diaphoresis.  She denies any recent orthopnea, PND, or lower extremity edema. Reports good compliance with her medication regimen. She does not check blood pressure regularly but it is well controlled at 104/70 during today's visit.   Past Medical History:  Diagnosis Date  . Acute ischemic stroke ([place]) 07/03/2012  . Crack cocaine use   . Diabetes ([place])   . Diabetes mellitus ([place])   . DVT (deep venous thrombosis) ([place])   . Hemiparesis, acute ([place]) 07/04/2012  . Hypertension   . LVH (left ventricular hypertrophy) 07/04/2012   Ejection fraction 60%.  . Myocardial infarct, old   . Thrombocytopenia ([place]) 07/03/2012  . Tobacco abuse     Past Surgical History:  Procedure Laterality Date  . ABDOMINAL HYSTERECTOMY    . HERNIA REPAIR      Current Medications: Outpatient Medications Prior to Visit  Medication Sig Dispense Refill  . albuterol (PROVENTIL HFA;VENTOLIN HFA) 108 (90 BASE) MCG/ACT inhaler Inhale 2 puffs into the lungs every 4 (four) hours as needed for wheezing or shortness of breath. 1 Inhaler 0  . atorvastatin (LIPITOR) 10 MG tablet Take 10 mg by mouth daily.     . budesonide-formoterol (SYMBICORT) 160-4.5 MCG/ACT inhaler Inhale 2 puffs into the lungs 2 (two)  times daily.    . furosemide (LASIX) 20 MG tablet Take 20 mg by mouth.    . gabapentin (NEURONTIN) 300 MG capsule Take 300 mg by mouth 3 (three) times daily.    . hydrALAZINE (APRESOLINE) 25 MG tablet Take 1 tablet (25 mg total) by mouth every 8 (eight) hours. 90 tablet 1  . HYDROcodone-acetaminophen (NORCO) 7.5-325 MG tablet Take 1 tablet by mouth every 6 (six) hours as needed for moderate pain (Must last 30 days). 60 tablet 0  . JARDIANCE 25 MG TABS tablet 25 mg daily.     [name]  lisinopril-hydrochlorothiazide (PRINZIDE,ZESTORETIC) 20-12.5 MG per tablet Take 2 tablets by mouth daily.    . metFORMIN (GLUCOPHAGE-XR) 500 MG 24 hr tablet Take 500 mg by mouth daily.    . metoprolol succinate (TOPROL-XL) 25 MG 24 hr tablet Take 25 mg by mouth daily.     [name] warfarin (COUMADIN) 5 MG tablet Take 1 tablet (5 mg total) by mouth daily. Resume on 3/29 30 tablet 0   No facility-administered medications prior to visit.      Allergies:   Patient has no known allergies.   Social History   Socioeconomic History  . Marital status: Widowed    Spouse name: None  . Number of children: None  . Years of education: None  . Highest education level: None  Social Needs  . Financial resource strain: None  . Food insecurity - worry: None  . Food insecurity - inability: None  . Transportation needs - medical: None  . Transportation needs - non-medical: None  Occupational History  . None  Tobacco Use  . Smoking status: Current Every Day Smoker    Packs/day: 1.00    Types: Cigarettes  . Smokeless tobacco: Never Used  Substance and Sexual Activity  . Alcohol use: No  . Drug use: No    Comment: last used 2 years ago  . Sexual activity: No    Birth control/protection: Surgical  Other Topics Concern  . None  Social History Narrative  . None     Family History:  The patient's family history includes Diabetes in her brother and mother; Heart disease in her father and mother; Hypertension in her father and mother; Stroke in her mother.   Review of Systems:   Please see the history of present illness.     General:  No chills, fever, night sweats or weight changes.  Cardiovascular:  No dyspnea on exertion, edema, orthopnea, palpitations, paroxysmal nocturnal dyspnea. Positive for chest pain.  Dermatological: No rash, lesions/masses Respiratory: No cough, dyspnea Urologic: No hematuria, dysuria Abdominal:   No nausea, vomiting, diarrhea, bright red blood per rectum, melena, or  hematemesis Neurologic:  No visual changes, wkns, changes in mental status. All other systems reviewed and are otherwise negative except as noted above.   Physical Exam:    VS:  BP 104/70 (BP Location: Left Arm)   Pulse 94   Ht 5\' 7"  (1.702 m)   Wt 252 lb (114.3 kg)   SpO2 91%   BMI 39.47 kg/m    General: Well developed, well nourished [place] American female appearing in no acute distress. Head: Normocephalic, atraumatic, sclera non-icteric, no xanthomas, nares are without discharge.  Neck: No carotid bruits. JVD not elevated.  Lungs: Respirations regular and unlabored, without wheezes or rales.  Heart: Regular rate and rhythm. No S3 or S4.  No murmur, no rubs, or gallops appreciated. Abdomen: Soft, non-tender, non-distended with normoactive bowel sounds. No hepatomegaly. No rebound/guarding. No  obvious abdominal masses. Msk:  Strength and tone appear normal for age. No joint deformities or effusions. Extremities: No clubbing or cyanosis. No lower extremity edema.  Distal pedal pulses are 2+ bilaterally. Neuro: Alert and oriented X 3. Moves all extremities spontaneously. No focal deficits noted. Psych:  Responds to questions appropriately with a normal affect. Skin: No rashes or lesions noted  Wt Readings from Last 3 Encounters:  02/17/18 252 lb (114.3 kg)  01/28/18 249 lb (112.9 kg)  01/14/18 247 lb (112 kg)     Studies/Labs Reviewed:   EKG:  EKG is not ordered today.   Recent Labs: 09/09/2017: B Natriuretic Peptide 30.0   Lipid Panel    Component Value Date/Time   CHOL 150 07/04/2012 0523   TRIG 65 07/04/2012 0523   HDL 62 07/04/2012 0523   CHOLHDL 2.4 07/04/2012 0523   VLDL 13 07/04/2012 0523   LDLCALC 75 07/04/2012 0523    Additional studies/ records that were reviewed today include:   Echocardiogram: 09/22/2017 Study Conclusions  - Left ventricle: The cavity size was normal. Wall thickness was   increased in a pattern of moderate LVH. Systolic function  was   vigorous. The estimated ejection fraction was in the range of 65%   to 70%. Wall motion was normal; there were no regional wall   motion abnormalities. Doppler parameters are consistent with   abnormal left ventricular relaxation (grade 1 diastolic   dysfunction). - Aortic valve: Mildly calcified annulus. Normal thickness   leaflets. Valve area (VTI): 3.46 cm^2. Valve area (Vmax): 3.19   cm^2. Valve area (Vmean): 3.02 cm^2. - Right ventricle: The cavity size was mildly to moderately   dilated. - Atrial septum: No defect or patent foramen ovale was identified. - Pulmonary arteries: Systolic pressure was moderately increased.   PA peak pressure: 50 mm Hg (S). - Technically adequate study.  Assessment:    1. Chest pain, unspecified type   2. Essential hypertension   3. Mixed hyperlipidemia   4. Type 2 diabetes mellitus with complication, without long-term current use of insulin (HCC)   5. Coagulation disorder ([place])   6. Tobacco use      Plan:   In order of problems listed above:  1. Chest Pain - the patient was recently evaluated at [facility] for chest pain and an abnormal EKG. Records are reviewed and there was concern as her EKG showed a RBBB but this has been noted on her prior tracings. Initial troponin was less than 0.01 and BNP was at 129.5. D-dimer was negative.  - she has continued to have episodes of chest discomfort at rest which can last for a few minutes up to several hours. Sometimes worse with ice consumption but not always. - her pain overall seems atypical for a cardiac etiology but she does have multiple cardiac risk factors including HTN, HLD, Type 2 DM, and continued tobacco use. Reviewed with the patient and will obtain a Lexiscan Myoview for ischemic evaluation (last ischemic eval was 15+ years ago by her report but recent echo in 08/2017 showed a preserved EF of 65-70% with no regional WMA at that time).   2. HTN - BP is well-controlled at 104/70  during today's visit. - continue Hydralazine 25mg  TID, Lisinopril-HCTZ 20-12.5mg  BID, and Toprol-XL 25mg  daily.   3. HLD - followed by PCP. Remains on Atorvastatin 10mg  daily.   4. Type 2 DM - followed by PCP. Rwmains on Jardiance and Metformin.   5. Coagulation Disorder - prior history  of DVT's which recent labs showing Protein C and Protein S deficiencies.  - remains on Coumadin for anticoagulation. Denies any evidence of active bleeding.   6. Tobacco Use - she continues to smoke 1-2 ppd. Cessation advised.     Medication Adjustments/Labs and Tests Ordered: Current medicines are reviewed at length with the patient today.  Concerns regarding medicines are outlined above.  Medication changes, Labs and Tests ordered today are listed in the Patient Instructions below. Patient Instructions  Medication Instructions:  Your physician recommends that you continue on your current medications as directed. Please refer to the Current Medication list given to you today.  Labwork: NONE   Testing/Procedures: Your physician has requested that you have a lexiscan myoview. For further information please visit [email or web address]. Please follow instruction sheet, as given.  Follow-Up: Your physician recommends that you schedule a follow-up appointment in: 2 Months   Any Other Special Instructions Will Be Listed Below (If Applicable).  If you need a refill on your cardiac medications before your next appointment, please call your pharmacy.  Thank you for choosing [facility]!    Signed, Erma Heritage, PA-C  02/17/2018 5:29 PM    [facility] [address] [place], [place] [address] Phone: [phone]

## 2018-02-19 DIAGNOSIS — L84 Corns and callosities: Secondary | ICD-10-CM | POA: Diagnosis not present

## 2018-02-19 DIAGNOSIS — I739 Peripheral vascular disease, unspecified: Secondary | ICD-10-CM | POA: Diagnosis not present

## 2018-02-19 DIAGNOSIS — E1151 Type 2 diabetes mellitus with diabetic peripheral angiopathy without gangrene: Secondary | ICD-10-CM | POA: Diagnosis not present

## 2018-02-19 DIAGNOSIS — L603 Nail dystrophy: Secondary | ICD-10-CM | POA: Diagnosis not present

## 2018-02-20 DIAGNOSIS — I80299 Phlebitis and thrombophlebitis of other deep vessels of unspecified lower extremity: Secondary | ICD-10-CM | POA: Diagnosis not present

## 2018-02-20 DIAGNOSIS — I10 Essential (primary) hypertension: Secondary | ICD-10-CM | POA: Diagnosis not present

## 2018-02-23 DIAGNOSIS — H2000 Unspecified acute and subacute iridocyclitis: Secondary | ICD-10-CM | POA: Diagnosis not present

## 2018-02-23 DIAGNOSIS — H40023 Open angle with borderline findings, high risk, bilateral: Secondary | ICD-10-CM | POA: Diagnosis not present

## 2018-02-26 ENCOUNTER — Encounter (HOSPITAL_COMMUNITY): Payer: Self-pay

## 2018-02-26 ENCOUNTER — Ambulatory Visit (HOSPITAL_COMMUNITY)
Admission: RE | Admit: 2018-02-26 | Discharge: 2018-02-26 | Disposition: A | Discharge status: Home / Self Care | Payer: Medicare Other | Source: Ambulatory Visit | Attending: Student | Admitting: Student

## 2018-02-26 ENCOUNTER — Encounter (HOSPITAL_BASED_OUTPATIENT_CLINIC_OR_DEPARTMENT_OTHER)
Admission: RE | Admit: 2018-02-26 | Discharge: 2018-02-26 | Disposition: A | Discharge status: Home / Self Care | Payer: Medicare Other | Source: Ambulatory Visit | Attending: Student | Admitting: Student

## 2018-02-26 DIAGNOSIS — Z720 Tobacco use: Secondary | ICD-10-CM

## 2018-02-26 DIAGNOSIS — R079 Chest pain, unspecified: Secondary | ICD-10-CM

## 2018-02-26 DIAGNOSIS — I10 Essential (primary) hypertension: Secondary | ICD-10-CM | POA: Diagnosis not present

## 2018-02-26 DIAGNOSIS — E782 Mixed hyperlipidemia: Secondary | ICD-10-CM | POA: Insufficient documentation

## 2018-02-26 LAB — NM MYOCAR MULTI W/SPECT W/WALL MOTION / EF
CHL CUP NUCLEAR SDS: 4
CHL CUP NUCLEAR SSS: 15
CSEPPHR: 97 {beats}/min
LV dias vol: 68 mL (ref 46–106)
LVSYSVOL: 17 mL
RATE: 0.43
Rest HR: 83 {beats}/min
SRS: 11
TID: 1.21

## 2018-02-26 MED ORDER — REGADENOSON 0.4 MG/5ML IV SOLN
INTRAVENOUS | Status: AC
  Administered 2018-02-26: 0.4 mg via INTRAVENOUS
  Filled 2018-02-26: qty 5

## 2018-02-26 MED ORDER — SODIUM CHLORIDE 0.9% FLUSH
INTRAVENOUS | Status: AC
  Administered 2018-02-26: 10 mL via INTRAVENOUS
  Filled 2018-02-26: qty 10

## 2018-02-26 MED ORDER — TECHNETIUM TC 99M TETROFOSMIN IV KIT
10.0000 | PACK | Freq: Once | INTRAVENOUS | Status: AC | PRN
  Administered 2018-02-26: 10.4 via INTRAVENOUS

## 2018-02-26 MED ORDER — TECHNETIUM TC 99M TETROFOSMIN IV KIT
30.0000 | PACK | Freq: Once | INTRAVENOUS | Status: AC | PRN
  Administered 2018-02-26: 31 via INTRAVENOUS

## 2018-03-04 DIAGNOSIS — Z7901 Long term (current) use of anticoagulants: Secondary | ICD-10-CM | POA: Diagnosis not present

## 2018-03-09 DIAGNOSIS — H2000 Unspecified acute and subacute iridocyclitis: Secondary | ICD-10-CM | POA: Diagnosis not present

## 2018-03-12 ENCOUNTER — Other Ambulatory Visit: Payer: Self-pay | Admitting: Orthopaedic Surgery

## 2018-03-12 ENCOUNTER — Other Ambulatory Visit: Payer: Self-pay | Admitting: Orthopedic Surgery

## 2018-03-12 MED ORDER — HYDROCODONE-ACETAMINOPHEN 7.5-325 MG PO TABS: 1.0000 | ORAL_TABLET | Freq: Four times a day (QID) | ORAL | 0 refills | Status: DC | PRN

## 2018-03-12 NOTE — Telephone Encounter (Signed)
Patient of Dr. Brooke Bonito requests refill on Hydrocodone/Acetaminophen 7.5-325  Mgs.   Qty  60  Sig: Take 1 tablet by mouth every 6 (six) hours as needed for moderate pain (Must last 30 days).   Patient states she uses [facility] in [place]

## 2018-03-18 DIAGNOSIS — Z7901 Long term (current) use of anticoagulants: Secondary | ICD-10-CM | POA: Diagnosis not present

## 2018-03-30 DIAGNOSIS — H2000 Unspecified acute and subacute iridocyclitis: Secondary | ICD-10-CM | POA: Diagnosis not present

## 2018-04-13 ENCOUNTER — Other Ambulatory Visit: Payer: Self-pay | Admitting: Orthopaedic Surgery

## 2018-04-13 MED ORDER — HYDROCODONE-ACETAMINOPHEN 7.5-325 MG PO TABS: 1.0000 | ORAL_TABLET | Freq: Four times a day (QID) | ORAL | 0 refills | Status: DC | PRN

## 2018-04-13 NOTE — Telephone Encounter (Signed)
Patient of Dr. Brooke Bonito requests refill on Hydrocodone/Acetaminophen 7.5-325  Mgs.   Qty  60       Sig: Take 1 tablet by mouth every 6 (six) hours as needed for moderate pain (Must last 30 days     Patient states she uses [facility] in [place]

## 2018-04-21 DIAGNOSIS — H2000 Unspecified acute and subacute iridocyclitis: Secondary | ICD-10-CM | POA: Diagnosis not present

## 2018-04-29 DIAGNOSIS — J441 Chronic obstructive pulmonary disease with (acute) exacerbation: Secondary | ICD-10-CM | POA: Diagnosis not present

## 2018-04-29 DIAGNOSIS — J449 Chronic obstructive pulmonary disease, unspecified: Secondary | ICD-10-CM | POA: Diagnosis not present

## 2018-05-05 ENCOUNTER — Encounter: Payer: Self-pay | Admitting: Cardiovascular Disease

## 2018-05-05 ENCOUNTER — Ambulatory Visit (INDEPENDENT_AMBULATORY_CARE_PROVIDER_SITE_OTHER): Payer: Medicare Other | Admitting: Cardiovascular Disease

## 2018-05-05 VITALS — BP 144/82 | HR 94 | Ht 67.0 in | Wt 251.0 lb

## 2018-05-05 DIAGNOSIS — E782 Mixed hyperlipidemia: Secondary | ICD-10-CM

## 2018-05-05 DIAGNOSIS — Z716 Tobacco abuse counseling: Secondary | ICD-10-CM

## 2018-05-05 DIAGNOSIS — E118 Type 2 diabetes mellitus with unspecified complications: Secondary | ICD-10-CM

## 2018-05-05 DIAGNOSIS — I10 Essential (primary) hypertension: Secondary | ICD-10-CM | POA: Diagnosis not present

## 2018-05-05 DIAGNOSIS — R079 Chest pain, unspecified: Secondary | ICD-10-CM | POA: Diagnosis not present

## 2018-05-05 DIAGNOSIS — F17210 Nicotine dependence, cigarettes, uncomplicated: Secondary | ICD-10-CM | POA: Diagnosis not present

## 2018-05-05 DIAGNOSIS — D689 Coagulation defect, unspecified: Secondary | ICD-10-CM | POA: Diagnosis not present

## 2018-05-05 NOTE — Patient Instructions (Addendum)
Your physician wants you to follow-up in:  As needed with [facility]   Your physician recommends that you continue on your current medications as directed. Please refer to the Current Medication list given to you today.     No lab work or tests ordered today.      Thank you for choosing [facility] !

## 2018-05-05 NOTE — Progress Notes (Signed)
SUBJECTIVE: The patient returns for follow-up after undergoing cardiovascular testing performed for the evaluation of chest pain.  She underwent a low risk nuclear stress test on 02/26/2018.  There is no evidence of ischemia, calculated LVEF 75%.  She has had no further episodes of chest pain.  She knows she needs to stop smoking.  She has Chantix at home.     Review of Systems: As per "subjective", otherwise negative.  No Known Allergies  Current Outpatient Medications  Medication Sig Dispense Refill  . albuterol (PROVENTIL HFA;VENTOLIN HFA) 108 (90 BASE) MCG/ACT inhaler Inhale 2 puffs into the lungs every 4 (four) hours as needed for wheezing or shortness of breath. 1 Inhaler 0  . atorvastatin (LIPITOR) 10 MG tablet Take 10 mg by mouth daily.     . budesonide-formoterol (SYMBICORT) 160-4.5 MCG/ACT inhaler Inhale 2 puffs into the lungs 2 (two) times daily.    . furosemide (LASIX) 20 MG tablet Take 20 mg by mouth.    . gabapentin (NEURONTIN) 300 MG capsule Take 300 mg by mouth 3 (three) times daily.    . hydrALAZINE (APRESOLINE) 25 MG tablet Take 1 tablet (25 mg total) by mouth every 8 (eight) hours. 90 tablet 1  . HYDROcodone-acetaminophen (NORCO) 7.5-325 MG tablet Take 1 tablet by mouth every 6 (six) hours as needed for moderate pain (Must last 30 days). 60 tablet 0  . JARDIANCE 25 MG TABS tablet 25 mg daily.     [name] lisinopril-hydrochlorothiazide (PRINZIDE,ZESTORETIC) 20-12.5 MG per tablet Take 2 tablets by mouth daily.    . metFORMIN (GLUCOPHAGE-XR) 500 MG 24 hr tablet Take 500 mg by mouth daily.    . metoprolol succinate (TOPROL-XL) 25 MG 24 hr tablet Take 25 mg by mouth daily.     [name] warfarin (COUMADIN) 5 MG tablet Take 1 tablet (5 mg total) by mouth daily. Resume on 3/29 30 tablet 0   No current facility-administered medications for this visit.     Past Medical History:  Diagnosis Date  . Acute ischemic stroke ([place]) 07/03/2012  . Crack cocaine use   . Diabetes ([place])   .  Diabetes mellitus ([place])   . DVT (deep venous thrombosis) ([place])   . Hemiparesis, acute ([place]) 07/04/2012  . Hypertension   . LVH (left ventricular hypertrophy) 07/04/2012   Ejection fraction 60%.  . Myocardial infarct, old   . Thrombocytopenia ([place]) 07/03/2012  . Tobacco abuse     Past Surgical History:  Procedure Laterality Date  . ABDOMINAL HYSTERECTOMY    . HERNIA REPAIR      Social History   Socioeconomic History  . Marital status: Widowed    Spouse name: Not on file  . Number of children: Not on file  . Years of education: Not on file  . Highest education level: Not on file  Occupational History  . Not on file  Social Needs  . Financial resource strain: Not on file  . Food insecurity:    Worry: Not on file    Inability: Not on file  . Transportation needs:    Medical: Not on file    Non-medical: Not on file  Tobacco Use  . Smoking status: Current Every Day Smoker    Packs/day: 1.00    Types: Cigarettes  . Smokeless tobacco: Never Used  Substance and Sexual Activity  . Alcohol use: No  . Drug use: No    Comment: last used 2 years ago  . Sexual activity: Never    Birth  control/protection: Surgical  Lifestyle  . Physical activity:    Days per week: Not on file    Minutes per session: Not on file  . Stress: Not on file  Relationships  . Social connections:    Talks on phone: Not on file    Gets together: Not on file    Attends religious service: Not on file    Active member of club or organization: Not on file    Attends meetings of clubs or organizations: Not on file    Relationship status: Not on file  . Intimate partner violence:    Fear of current or ex partner: Not on file    Emotionally abused: Not on file    Physically abused: Not on file    Forced sexual activity: Not on file  Other Topics Concern  . Not on file  Social History Narrative  . Not on file     Vitals:   05/05/18 1418  BP: (!) 144/82  Pulse: 94  SpO2: 91%  Weight: 251 lb (113.9  kg)  Height: 5\' 7"  (1.702 m)    Wt Readings from Last 3 Encounters:  05/05/18 251 lb (113.9 kg)  02/17/18 252 lb (114.3 kg)  01/28/18 249 lb (112.9 kg)     PHYSICAL EXAM General: NAD HEENT: Normal. Neck: No JVD, no thyromegaly. Lungs: Clear to auscultation bilaterally with normal respiratory effort. CV: Regular rate and rhythm, normal S1/S2, no S3/S4, no murmur. No pretibial or periankle edema.  No carotid bruit.   Abdomen: Soft, nontender, no distention.  Neurologic: Alert and oriented.  Psych: Normal affect. Skin: Normal. Musculoskeletal: No gross deformities.    ECG: Most recent ECG reviewed.   Labs: Lab Results  Component Value Date/Time   K 3.7 03/24/2015 06:45 AM   BUN 31 (H) 03/24/2015 06:45 AM   CREATININE 1.29 (H) 03/24/2015 06:45 AM   ALT 39 (H) 03/22/2015 04:40 AM   HGB 15.3 (H) 03/23/2015 04:36 AM     Lipids: Lab Results  Component Value Date/Time   LDLCALC 75 07/04/2012 05:23 AM   CHOL 150 07/04/2012 05:23 AM   TRIG 65 07/04/2012 05:23 AM   HDL 62 07/04/2012 05:23 AM       ASSESSMENT AND PLAN: 1.  Chest pain: Nuclear stress test was normal.  No further cardiac testing is indicated at this time.  2.  Hypertension: Mildly elevated today on multiple anti-hypertensive agents which were previously reviewed.  Normal at last visit.  No changes to therapy.  3.  Hyperlipidemia: Continue Lipitor 10 mg.  4.  Type 2 diabetes mellitus: Continue Jardiance and metformin.  5.  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  She remains on lifelong warfarin for systemic anticoagulation.  6.  Tobacco abuse disorder: We discussed tobacco cessation (3 minutes).  She has Chantix at home.    Disposition: Follow up as needed   Kate Sable, M.D., F.A.C.C.

## 2018-05-13 ENCOUNTER — Encounter: Payer: Self-pay | Admitting: Orthopaedic Surgery

## 2018-05-13 ENCOUNTER — Ambulatory Visit (INDEPENDENT_AMBULATORY_CARE_PROVIDER_SITE_OTHER): Payer: Medicare Other | Admitting: Orthopaedic Surgery

## 2018-05-13 VITALS — BP 136/84 | HR 96 | Ht 67.0 in | Wt 247.0 lb

## 2018-05-13 DIAGNOSIS — M25561 Pain in right knee: Secondary | ICD-10-CM

## 2018-05-13 DIAGNOSIS — G8929 Other chronic pain: Secondary | ICD-10-CM

## 2018-05-13 DIAGNOSIS — F17210 Nicotine dependence, cigarettes, uncomplicated: Secondary | ICD-10-CM | POA: Diagnosis not present

## 2018-05-13 DIAGNOSIS — I10 Essential (primary) hypertension: Secondary | ICD-10-CM | POA: Diagnosis not present

## 2018-05-13 MED ORDER — HYDROCODONE-ACETAMINOPHEN 7.5-325 MG PO TABS: 1.0000 | ORAL_TABLET | Freq: Four times a day (QID) | ORAL | 0 refills | Status: DC | PRN

## 2018-05-13 NOTE — Patient Instructions (Signed)
Steps to Quit Smoking Smoking tobacco can be bad for your health. It can also affect almost every organ in your body. Smoking puts you and people around you at risk for many serious Kayleena Eke-lasting (chronic) diseases. Quitting smoking is hard, but it is one of the best things that you can do for your health. It is never too late to quit. What are the benefits of quitting smoking? When you quit smoking, you lower your risk for getting serious diseases and conditions. They can include:  Lung cancer or lung disease.  Heart disease.  Stroke.  Heart attack.  Not being able to have children (infertility).  Weak bones (osteoporosis) and broken bones (fractures).  If you have coughing, wheezing, and shortness of breath, those symptoms may get better when you quit. You may also get sick less often. If you are pregnant, quitting smoking can help to lower your chances of having a baby of low birth weight. What can I do to help me quit smoking? Talk with your doctor about what can help you quit smoking. Some things you can do (strategies) include:  Quitting smoking totally, instead of slowly cutting back how much you smoke over a period of time.  Going to in-person counseling. You are more likely to quit if you go to many counseling sessions.  Using resources and support systems, such as: ? Online chats with a counselor. ? Phone quitlines. ? Printed self-help materials. ? Support groups or group counseling. ? Text messaging programs. ? Mobile phone apps or applications.  Taking medicines. Some of these medicines may have nicotine in them. If you are pregnant or breastfeeding, do not take any medicines to quit smoking unless your doctor says it is okay. Talk with your doctor about counseling or other things that can help you.  Talk with your doctor about using more than one strategy at the same time, such as taking medicines while you are also going to in-person counseling. This can help make  quitting easier. What things can I do to make it easier to quit? Quitting smoking might feel very hard at first, but there is a lot that you can do to make it easier. Take these steps:  Talk to your family and friends. Ask them to support and encourage you.  Call phone quitlines, reach out to support groups, or work with a counselor.  Ask people who smoke to not smoke around you.  Avoid places that make you want (trigger) to smoke, such as: ? Bars. ? Parties. ? Smoke-break areas at work.  Spend time with people who do not smoke.  Lower the stress in your life. Stress can make you want to smoke. Try these things to help your stress: ? Getting regular exercise. ? Deep-breathing exercises. ? Yoga. ? Meditating. ? Doing a body scan. To do this, close your eyes, focus on one area of your body at a time from head to toe, and notice which parts of your body are tense. Try to relax the muscles in those areas.  Download or buy apps on your mobile phone or tablet that can help you stick to your quit plan. There are many free apps, such as QuitGuide from the CDC (Centers for Disease Control and Prevention). You can find more support from smokefree.gov and other websites.  This information is not intended to replace advice given to you by your health care provider. Make sure you discuss any questions you have with your health care provider. Document Released: 10/12/2009 Document   Revised: 08/13/2016 Document Reviewed: 05/02/2015 Elsevier Interactive Patient Education  2018 Elsevier Inc.  

## 2018-05-13 NOTE — Progress Notes (Signed)
Patient PI:Tracy [name], female DOB:08-Jul-1961, 57 y.o. SAY:[id number]  Chief Complaint  Patient presents with  . Follow-up    Chronic pain right knee    HPI  Tracy [name] is a 57 y.o. female who has chronic right knee pain.  She has had more pain with the recent cold and then warm weather and the rain.  She has no new trauma, no giving way.  She has swelling and popping.  She uses a cane now. HPI  Body mass index is 38.69 kg/m.  ROS  Review of Systems  Constitutional:       Patient has Diabetes Mellitus. Patient has hypertension. Patient has COPD or shortness of breath. Patient has BMI > 35. Patient has current smoking history.  HENT: Negative for congestion.   Respiratory: Positive for cough and shortness of breath.   Cardiovascular: Negative for chest pain.  Endocrine: Positive for cold intolerance.  Musculoskeletal: Positive for arthralgias, gait problem, joint swelling and myalgias.  Allergic/Immunologic: Positive for environmental allergies.  Neurological: Negative for numbness.  All other systems reviewed and are negative.   Past Medical History:  Diagnosis Date  . Acute ischemic stroke ([place]) 07/03/2012  . Crack cocaine use   . Diabetes ([place])   . Diabetes mellitus ([place])   . DVT (deep venous thrombosis) ([place])   . Hemiparesis, acute ([place]) 07/04/2012  . Hypertension   . LVH (left ventricular hypertrophy) 07/04/2012   Ejection fraction 60%.  . Myocardial infarct, old   . Thrombocytopenia ([place]) 07/03/2012  . Tobacco abuse     Past Surgical History:  Procedure Laterality Date  . ABDOMINAL HYSTERECTOMY    . HERNIA REPAIR      Family History  Problem Relation Age of Onset  . Heart disease Mother   . Diabetes Mother   . Stroke Mother   . Hypertension Mother   . Hypertension Father   . Heart disease Father   . Diabetes Brother     Social History Social History   Tobacco Use  . Smoking status: Current Every Day Smoker    Packs/day: 1.00     Types: Cigarettes  . Smokeless tobacco: Never Used  Substance Use Topics  . Alcohol use: No  . Drug use: No    Comment: last used 2 years ago    No Known Allergies  Current Outpatient Medications  Medication Sig Dispense Refill  . albuterol (PROVENTIL HFA;VENTOLIN HFA) 108 (90 BASE) MCG/ACT inhaler Inhale 2 puffs into the lungs every 4 (four) hours as needed for wheezing or shortness of breath. 1 Inhaler 0  . atorvastatin (LIPITOR) 10 MG tablet Take 10 mg by mouth daily.     . budesonide-formoterol (SYMBICORT) 160-4.5 MCG/ACT inhaler Inhale 2 puffs into the lungs 2 (two) times daily.    . furosemide (LASIX) 20 MG tablet Take 20 mg by mouth.    . gabapentin (NEURONTIN) 300 MG capsule Take 300 mg by mouth 3 (three) times daily.    . hydrALAZINE (APRESOLINE) 25 MG tablet Take 1 tablet (25 mg total) by mouth every 8 (eight) hours. 90 tablet 1  . HYDROcodone-acetaminophen (NORCO) 7.5-325 MG tablet Take 1 tablet by mouth every 6 (six) hours as needed for moderate pain (Must last 30 days). 60 tablet 0  . JARDIANCE 25 MG TABS tablet 25 mg daily.     [name] lisinopril-hydrochlorothiazide (PRINZIDE,ZESTORETIC) 20-12.5 MG per tablet Take 2 tablets by mouth daily.    . metFORMIN (GLUCOPHAGE-XR) 500 MG 24 hr tablet Take 500 mg by mouth daily.    [name]  metoprolol succinate (TOPROL-XL) 25 MG 24 hr tablet Take 25 mg by mouth daily.     [name] warfarin (COUMADIN) 5 MG tablet Take 1 tablet (5 mg total) by mouth daily. Resume on 3/29 30 tablet 0   No current facility-administered medications for this visit.      Physical Exam  Blood pressure 136/84, pulse 96, height 5\' 7"  (1.702 m), weight 247 lb (112 kg).  Constitutional: overall normal hygiene, normal nutrition, well developed, normal grooming, normal body habitus. Assistive device:cane  Musculoskeletal: gait and station Limp right, muscle tone and strength are normal, no tremors or atrophy is present.  .  Neurological: coordination overall normal.  Deep  tendon reflex/nerve stretch intact.  Sensation normal.  Cranial nerves II-XII intact.   Skin:   Normal overall no scars, lesions, ulcers or rashes. No psoriasis.  Psychiatric: Alert and oriented x 3.  Recent memory intact, remote memory unclear.  Normal mood and affect. Well groomed.  Good eye contact.  Cardiovascular: overall no swelling, no varicosities, no edema bilaterally, normal temperatures of the legs and arms, no clubbing, cyanosis and good capillary refill.  Lymphatic: palpation is normal.  The right lower extremity is examined:  Inspection:  Thigh:  Non-tender and no defects  Knee has swelling 1+ effusion.                        Joint tenderness is present                        Patient is tender over the medial joint line  Lower Leg:  Has normal appearance and no tenderness or defects  Ankle:  Non-tender and no defects  Foot:  Non-tender and no defects Range of Motion:  Knee:  Range of motion is: 0-100                        Crepitus is  present  Ankle:  Range of motion is normal. Strength and Tone:  The right lower extremity has normal strength and tone. Stability:  Knee:  The knee is stable.  Ankle:  The ankle is stable.   All other systems reviewed and are negative   The patient has been educated about the nature of the problem(s) and counseled on treatment options.  The patient appeared to understand what I have discussed and is in agreement with it.  Encounter Diagnoses  Name Primary?  . Chronic pain of right knee Yes  . Essential hypertension   . Cigarette nicotine dependence without complication     PLAN Call if any problems.  Precautions discussed.  Continue current medications.   Return to clinic 3 months   I have reviewed the [place] web site prior to prescribing narcotic medicine for this patient.  Electronically Signed Sanjuana Kava, MD 5/15/201910:58 AM

## 2018-05-14 DIAGNOSIS — E1151 Type 2 diabetes mellitus with diabetic peripheral angiopathy without gangrene: Secondary | ICD-10-CM | POA: Diagnosis not present

## 2018-05-14 DIAGNOSIS — L603 Nail dystrophy: Secondary | ICD-10-CM | POA: Diagnosis not present

## 2018-05-14 DIAGNOSIS — I739 Peripheral vascular disease, unspecified: Secondary | ICD-10-CM | POA: Diagnosis not present

## 2018-05-14 DIAGNOSIS — L84 Corns and callosities: Secondary | ICD-10-CM | POA: Diagnosis not present

## 2018-05-20 DIAGNOSIS — E1165 Type 2 diabetes mellitus with hyperglycemia: Secondary | ICD-10-CM | POA: Diagnosis not present

## 2018-05-20 DIAGNOSIS — Z7901 Long term (current) use of anticoagulants: Secondary | ICD-10-CM | POA: Diagnosis not present

## 2018-05-20 DIAGNOSIS — I10 Essential (primary) hypertension: Secondary | ICD-10-CM | POA: Diagnosis not present

## 2018-05-20 DIAGNOSIS — I80299 Phlebitis and thrombophlebitis of other deep vessels of unspecified lower extremity: Secondary | ICD-10-CM | POA: Diagnosis not present

## 2018-05-20 DIAGNOSIS — J441 Chronic obstructive pulmonary disease with (acute) exacerbation: Secondary | ICD-10-CM | POA: Diagnosis not present

## 2018-05-29 DIAGNOSIS — Z7901 Long term (current) use of anticoagulants: Secondary | ICD-10-CM | POA: Diagnosis not present

## 2018-06-02 DIAGNOSIS — H2000 Unspecified acute and subacute iridocyclitis: Secondary | ICD-10-CM | POA: Diagnosis not present

## 2018-06-02 DIAGNOSIS — H26492 Other secondary cataract, left eye: Secondary | ICD-10-CM | POA: Diagnosis not present

## 2018-06-02 DIAGNOSIS — H26491 Other secondary cataract, right eye: Secondary | ICD-10-CM | POA: Diagnosis not present

## 2018-06-11 ENCOUNTER — Telehealth: Payer: Self-pay | Admitting: Orthopaedic Surgery

## 2018-06-11 MED ORDER — HYDROCODONE-ACETAMINOPHEN 7.5-325 MG PO TABS: 1.0000 | ORAL_TABLET | Freq: Four times a day (QID) | ORAL | 0 refills | Status: DC | PRN

## 2018-06-11 NOTE — Telephone Encounter (Signed)
Hydrocodone-Acetaminophen 7.5/325 mg  Qty  60 Tablets ° ° °PATIENT USES [place] [facility] °

## 2018-06-12 DIAGNOSIS — Z7901 Long term (current) use of anticoagulants: Secondary | ICD-10-CM | POA: Diagnosis not present

## 2018-07-13 ENCOUNTER — Telehealth: Payer: Self-pay | Admitting: Orthopaedic Surgery

## 2018-07-13 DIAGNOSIS — Z7901 Long term (current) use of anticoagulants: Secondary | ICD-10-CM | POA: Diagnosis not present

## 2018-07-13 NOTE — Telephone Encounter (Signed)
Patient called for refill: HYDROcodone-acetaminophen (NORCO) 7.5-325 MG tablet 60 tablet   -[facility]

## 2018-07-14 MED ORDER — HYDROCODONE-ACETAMINOPHEN 7.5-325 MG PO TABS: 1.0000 | ORAL_TABLET | Freq: Four times a day (QID) | ORAL | 0 refills | Status: DC | PRN

## 2018-08-12 ENCOUNTER — Ambulatory Visit (INDEPENDENT_AMBULATORY_CARE_PROVIDER_SITE_OTHER): Payer: Medicare Other | Admitting: Orthopaedic Surgery

## 2018-08-12 ENCOUNTER — Encounter: Payer: Self-pay | Admitting: Orthopaedic Surgery

## 2018-08-12 VITALS — BP 135/81 | HR 70 | Temp 98.6°F | Ht 67.0 in | Wt 246.0 lb

## 2018-08-12 DIAGNOSIS — F17210 Nicotine dependence, cigarettes, uncomplicated: Secondary | ICD-10-CM | POA: Diagnosis not present

## 2018-08-12 DIAGNOSIS — M25561 Pain in right knee: Secondary | ICD-10-CM | POA: Diagnosis not present

## 2018-08-12 DIAGNOSIS — G8929 Other chronic pain: Secondary | ICD-10-CM | POA: Diagnosis not present

## 2018-08-12 DIAGNOSIS — Z7901 Long term (current) use of anticoagulants: Secondary | ICD-10-CM | POA: Diagnosis not present

## 2018-08-12 MED ORDER — HYDROCODONE-ACETAMINOPHEN 7.5-325 MG PO TABS: 1.0000 | ORAL_TABLET | Freq: Four times a day (QID) | ORAL | 0 refills | Status: DC | PRN

## 2018-08-12 NOTE — Patient Instructions (Signed)
Steps to Quit Smoking Smoking tobacco can be bad for your health. It can also affect almost every organ in your body. Smoking puts you and people around you at risk for many serious long-lasting (chronic) diseases. Quitting smoking is hard, but it is one of the best things that you can do for your health. It is never too late to quit. What are the benefits of quitting smoking? When you quit smoking, you lower your risk for getting serious diseases and conditions. They can include:  Lung cancer or lung disease.  Heart disease.  Stroke.  Heart attack.  Not being able to have children (infertility).  Weak bones (osteoporosis) and broken bones (fractures).  If you have coughing, wheezing, and shortness of breath, those symptoms may get better when you quit. You may also get sick less often. If you are pregnant, quitting smoking can help to lower your chances of having a baby of low birth weight. What can I do to help me quit smoking? Talk with your doctor about what can help you quit smoking. Some things you can do (strategies) include:  Quitting smoking totally, instead of slowly cutting back how much you smoke over a period of time.  Going to in-person counseling. You are more likely to quit if you go to many counseling sessions.  Using resources and support systems, such as: ? Online chats with a counselor. ? Phone quitlines. ? Printed self-help materials. ? Support groups or group counseling. ? Text messaging programs. ? Mobile phone apps or applications.  Taking medicines. Some of these medicines may have nicotine in them. If you are pregnant or breastfeeding, do not take any medicines to quit smoking unless your doctor says it is okay. Talk with your doctor about counseling or other things that can help you.  Talk with your doctor about using more than one strategy at the same time, such as taking medicines while you are also going to in-person counseling. This can help make  quitting easier. What things can I do to make it easier to quit? Quitting smoking might feel very hard at first, but there is a lot that you can do to make it easier. Take these steps:  Talk to your family and friends. Ask them to support and encourage you.  Call phone quitlines, reach out to support groups, or work with a counselor.  Ask people who smoke to not smoke around you.  Avoid places that make you want (trigger) to smoke, such as: ? Bars. ? Parties. ? Smoke-break areas at work.  Spend time with people who do not smoke.  Lower the stress in your life. Stress can make you want to smoke. Try these things to help your stress: ? Getting regular exercise. ? Deep-breathing exercises. ? Yoga. ? Meditating. ? Doing a body scan. To do this, close your eyes, focus on one area of your body at a time from head to toe, and notice which parts of your body are tense. Try to relax the muscles in those areas.  Download or buy apps on your mobile phone or tablet that can help you stick to your quit plan. There are many free apps, such as QuitGuide from the CDC (Centers for Disease Control and Prevention). You can find more support from smokefree.gov and other websites.  This information is not intended to replace advice given to you by your health care provider. Make sure you discuss any questions you have with your health care provider. Document Released: 10/12/2009 Document   Revised: 08/13/2016 Document Reviewed: 05/02/2015 Elsevier Interactive Patient Education  2018 Elsevier Inc.  

## 2018-08-12 NOTE — Progress Notes (Signed)
Patient [name], female DOB:07-26-1961, 57 y.o. HEN:[id number]  Chief Complaint  Patient presents with  . Knee Pain    right    HPI  Tracy [name] is a 57 y.o. female who has chronic pain of the right knee.  She has swelling and popping but no giving way.  She is taking her pain medicine. She has no new trauma.  She uses her cane.  She is on Coumadin chronically and cannot take NSAIDs.  She continues to smoke but is down to a pack a day now from two packs. The patient was counseled about smoking and smoking cessation.  I spent about three to five minutes in doing this.  I, of course, determined the patient does smoke and frequency.  I have advised the patient of problems medically that can occur with continued smoking including poor wound and bone healing as well as the obvious respiratory problems.  I have assessed the patient's willingness to cut back and quit smoking.  The patient has stated she will try to cut back more to hopefully a half a pack a day.  I have told the patient to discuss with their family doctor also about smoking cessation.  I am willing to assist my patient in arranging this and to get additional help.I have suggested the patient have a set date to try to begin cutting back.  I have printed out a smoking cessation form about how to begin cutting back and suggestions.  I will discuss this further when I see the patient in the future.  I have stressed that although this will be very difficult to accomplish, the benefits are very substantial and will give long term health benefits from now on.   Body mass index is 38.53 kg/m.  ROS  Review of Systems  Constitutional:       Patient has Diabetes Mellitus. Patient has hypertension. Patient has COPD or shortness of breath. Patient has BMI > 35. Patient has current smoking history.  HENT: Negative for congestion.   Respiratory: Positive for cough and shortness of breath.   Cardiovascular:  Negative for chest pain.  Endocrine: Positive for cold intolerance.  Musculoskeletal: Positive for arthralgias, gait problem, joint swelling and myalgias.  Allergic/Immunologic: Positive for environmental allergies.  Neurological: Negative for numbness.  All other systems reviewed and are negative.   All other systems reviewed and are negative.  Past Medical History:  Diagnosis Date  . Acute ischemic stroke ([place]) 07/03/2012  . Crack cocaine use   . Diabetes ([place])   . Diabetes mellitus ([place])   . DVT (deep venous thrombosis) ([place])   . Hemiparesis, acute ([place]) 07/04/2012  . Hypertension   . LVH (left ventricular hypertrophy) 07/04/2012   Ejection fraction 60%.  . Myocardial infarct, old   . Thrombocytopenia ([place]) 07/03/2012  . Tobacco abuse     Past Surgical History:  Procedure Laterality Date  . ABDOMINAL HYSTERECTOMY    . HERNIA REPAIR      Family History  Problem Relation Age of Onset  . Heart disease Mother   . Diabetes Mother   . Stroke Mother   . Hypertension Mother   . Hypertension Father   . Heart disease Father   . Diabetes Brother     Social History Social History   Tobacco Use  . Smoking status: Current Every Day Smoker    Packs/day: 1.00    Types: Cigarettes  . Smokeless tobacco: Never Used  Substance Use Topics  . Alcohol use: No  .  Drug use: No    Comment: last used 2 years ago    No Known Allergies  Current Outpatient Medications  Medication Sig Dispense Refill  . albuterol (PROVENTIL HFA;VENTOLIN HFA) 108 (90 BASE) MCG/ACT inhaler Inhale 2 puffs into the lungs every 4 (four) hours as needed for wheezing or shortness of breath. 1 Inhaler 0  . atorvastatin (LIPITOR) 10 MG tablet Take 10 mg by mouth daily.     . budesonide-formoterol (SYMBICORT) 160-4.5 MCG/ACT inhaler Inhale 2 puffs into the lungs 2 (two) times daily.    . furosemide (LASIX) 20 MG tablet Take 20 mg by mouth.    . gabapentin (NEURONTIN) 300 MG capsule Take 300 mg by mouth 3 (three)  times daily.    . hydrALAZINE (APRESOLINE) 25 MG tablet Take 1 tablet (25 mg total) by mouth every 8 (eight) hours. 90 tablet 1  . HYDROcodone-acetaminophen (NORCO) 7.5-325 MG tablet Take 1 tablet by mouth every 6 (six) hours as needed for moderate pain (Must last 30 days). 60 tablet 0  . JARDIANCE 25 MG TABS tablet 25 mg daily.     [name] lisinopril-hydrochlorothiazide (PRINZIDE,ZESTORETIC) 20-12.5 MG per tablet Take 2 tablets by mouth daily.    . metFORMIN (GLUCOPHAGE-XR) 500 MG 24 hr tablet Take 500 mg by mouth daily.    . metoprolol succinate (TOPROL-XL) 25 MG 24 hr tablet Take 25 mg by mouth daily.     [name] warfarin (COUMADIN) 2.5 MG tablet Take 2.5 mg by mouth daily. as directed  0  . warfarin (COUMADIN) 5 MG tablet Take 1 tablet (5 mg total) by mouth daily. Resume on 3/29 (Patient not taking: Reported on 08/12/2018) 30 tablet 0   No current facility-administered medications for this visit.      Physical Exam  Blood pressure 135/81, pulse 70, temperature 98.6 F (37 C), height 5\' 7"  (1.702 m), weight 246 lb (111.6 kg).  Constitutional: overall normal hygiene, normal nutrition, well developed, normal grooming, normal body habitus. Assistive device:cane  Musculoskeletal: gait and station Limp Right, muscle tone and strength are normal, no tremors or atrophy is present.  .  Neurological: coordination overall normal.  Deep tendon reflex/nerve stretch intact.  Sensation normal.  Cranial nerves II-XII intact.   Skin:   Normal overall no scars, lesions, ulcers or rashes. No psoriasis.  Psychiatric: Alert and oriented x 3.  Recent memory intact, remote memory unclear.  Normal mood and affect. Well groomed.  Good eye contact.  Cardiovascular: overall no swelling, no varicosities, no edema bilaterally, normal temperatures of the legs and arms, no clubbing, cyanosis and good capillary refill.  Lymphatic: palpation is normal.  The right lower extremity is examined:  Inspection:  Thigh:   Non-tender and no defects  Knee has swelling 1+ effusion.                        Joint tenderness is present                        Patient is tender over the medial joint line  Lower Leg:  Has normal appearance and no tenderness or defects  Ankle:  Non-tender and no defects  Foot:  Non-tender and no defects Range of Motion:  Knee:  Range of motion is: 0-100                        Crepitus is  present  Ankle:  Range of motion  is normal. Strength and Tone:  The right lower extremity has normal strength and tone. Stability:  Knee:  The knee is stable.  Ankle:  The ankle is stable. All other systems reviewed and are negative   The patient has been educated about the nature of the problem(s) and counseled on treatment options.  The patient appeared to understand what I have discussed and is in agreement with it.  Encounter Diagnoses  Name Primary?  . Chronic pain of right knee Yes  . Cigarette nicotine dependence without complication   . Current use of long term anticoagulation     PLAN Call if any problems.  Precautions discussed.  Continue current medications.   Return to clinic 3 months   I have reviewed the [place] web site prior to prescribing narcotic medicine for this patient.  The patient has read and signed an Opioid Treatment Agreement which has been scanned and added to the medical record.  The patient understands the agreement and agrees to abide with it.  The patient has chronic pain that is being treated with an opioid which relieves the pain.  The patient understands potential complications with chronic opioid treatment.  Electronically Signed Sanjuana Kava, MD 8/14/20199:52 AM

## 2018-08-17 DIAGNOSIS — J449 Chronic obstructive pulmonary disease, unspecified: Secondary | ICD-10-CM | POA: Diagnosis not present

## 2018-08-17 DIAGNOSIS — I10 Essential (primary) hypertension: Secondary | ICD-10-CM | POA: Diagnosis not present

## 2018-08-17 DIAGNOSIS — E782 Mixed hyperlipidemia: Secondary | ICD-10-CM | POA: Diagnosis not present

## 2018-08-17 DIAGNOSIS — Z7901 Long term (current) use of anticoagulants: Secondary | ICD-10-CM | POA: Diagnosis not present

## 2018-08-17 DIAGNOSIS — E1165 Type 2 diabetes mellitus with hyperglycemia: Secondary | ICD-10-CM | POA: Diagnosis not present

## 2018-08-20 DIAGNOSIS — I10 Essential (primary) hypertension: Secondary | ICD-10-CM | POA: Diagnosis not present

## 2018-08-20 DIAGNOSIS — R600 Localized edema: Secondary | ICD-10-CM | POA: Diagnosis not present

## 2018-08-20 DIAGNOSIS — E1165 Type 2 diabetes mellitus with hyperglycemia: Secondary | ICD-10-CM | POA: Diagnosis not present

## 2018-08-20 DIAGNOSIS — Z7901 Long term (current) use of anticoagulants: Secondary | ICD-10-CM | POA: Diagnosis not present

## 2018-08-20 DIAGNOSIS — Z86718 Personal history of other venous thrombosis and embolism: Secondary | ICD-10-CM | POA: Diagnosis not present

## 2018-08-20 DIAGNOSIS — J449 Chronic obstructive pulmonary disease, unspecified: Secondary | ICD-10-CM | POA: Diagnosis not present

## 2018-08-20 DIAGNOSIS — D6851 Activated protein C resistance: Secondary | ICD-10-CM | POA: Diagnosis not present

## 2018-08-21 DIAGNOSIS — D6851 Activated protein C resistance: Secondary | ICD-10-CM | POA: Diagnosis not present

## 2018-08-21 DIAGNOSIS — Z86718 Personal history of other venous thrombosis and embolism: Secondary | ICD-10-CM | POA: Diagnosis not present

## 2018-08-21 DIAGNOSIS — Z7901 Long term (current) use of anticoagulants: Secondary | ICD-10-CM | POA: Diagnosis not present

## 2018-08-21 DIAGNOSIS — R600 Localized edema: Secondary | ICD-10-CM | POA: Diagnosis not present

## 2018-08-24 DIAGNOSIS — Z7901 Long term (current) use of anticoagulants: Secondary | ICD-10-CM | POA: Diagnosis not present

## 2018-08-24 DIAGNOSIS — Z86718 Personal history of other venous thrombosis and embolism: Secondary | ICD-10-CM | POA: Diagnosis not present

## 2018-08-24 DIAGNOSIS — D6851 Activated protein C resistance: Secondary | ICD-10-CM | POA: Diagnosis not present

## 2018-08-28 DIAGNOSIS — Z7901 Long term (current) use of anticoagulants: Secondary | ICD-10-CM | POA: Diagnosis not present

## 2018-09-01 DIAGNOSIS — Z7901 Long term (current) use of anticoagulants: Secondary | ICD-10-CM | POA: Diagnosis not present

## 2018-09-01 DIAGNOSIS — J441 Chronic obstructive pulmonary disease with (acute) exacerbation: Secondary | ICD-10-CM | POA: Diagnosis not present

## 2018-09-08 DIAGNOSIS — Z7901 Long term (current) use of anticoagulants: Secondary | ICD-10-CM | POA: Diagnosis not present

## 2018-09-08 DIAGNOSIS — I80299 Phlebitis and thrombophlebitis of other deep vessels of unspecified lower extremity: Secondary | ICD-10-CM | POA: Diagnosis not present

## 2018-09-10 ENCOUNTER — Telehealth: Payer: Self-pay | Admitting: Orthopaedic Surgery

## 2018-09-10 NOTE — Telephone Encounter (Signed)
Patient requests refill on Hydrocodone/Acetaminophen 7.5-325 mgs.  Qty 60       Sig: Take 1 tablet by mouth every 6 (six) hours as needed for moderate pain (Must last 30 days).    Patient states she uses Product/process development scientist

## 2018-09-14 DIAGNOSIS — H5211 Myopia, right eye: Secondary | ICD-10-CM | POA: Diagnosis not present

## 2018-09-14 DIAGNOSIS — H2011 Chronic iridocyclitis, right eye: Secondary | ICD-10-CM | POA: Diagnosis not present

## 2018-09-14 MED ORDER — HYDROCODONE-ACETAMINOPHEN 7.5-325 MG PO TABS: 1.0000 | ORAL_TABLET | Freq: Four times a day (QID) | ORAL | 0 refills | Status: DC | PRN

## 2018-09-16 DIAGNOSIS — I80299 Phlebitis and thrombophlebitis of other deep vessels of unspecified lower extremity: Secondary | ICD-10-CM | POA: Diagnosis not present

## 2018-10-12 ENCOUNTER — Telehealth: Payer: Self-pay | Admitting: Orthopaedic Surgery

## 2018-10-12 NOTE — Telephone Encounter (Signed)
Hydrocodone-Acetaminophen 7.5/325 mg  Qty  60 Tablets   PATIENT USES [facility]

## 2018-10-13 MED ORDER — HYDROCODONE-ACETAMINOPHEN 7.5-325 MG PO TABS: 1.0000 | ORAL_TABLET | Freq: Four times a day (QID) | ORAL | 0 refills | Status: DC | PRN

## 2018-10-14 DIAGNOSIS — Z7901 Long term (current) use of anticoagulants: Secondary | ICD-10-CM | POA: Diagnosis not present

## 2018-10-17 DIAGNOSIS — Z7901 Long term (current) use of anticoagulants: Secondary | ICD-10-CM | POA: Diagnosis not present

## 2018-10-23 DIAGNOSIS — Z7901 Long term (current) use of anticoagulants: Secondary | ICD-10-CM | POA: Diagnosis not present

## 2018-10-26 ENCOUNTER — Other Ambulatory Visit: Payer: Self-pay

## 2018-10-26 NOTE — Patient Outreach (Signed)
[facility]) Care Management  10/26/2018  Monte Bronder 24-Jan-1961 [id number]   Medication Adherence call to Mrs. Rozlyn Boling spoke with patient she is no longer taking Atorvastatin 10 mg doctor took her off.Mrs. Enerson is showing past due under [place] [employer] care Ins.  [place] Management Direct Dial [phone]  Fax [phone] [email or web address]

## 2018-11-06 DIAGNOSIS — I739 Peripheral vascular disease, unspecified: Secondary | ICD-10-CM | POA: Diagnosis not present

## 2018-11-06 DIAGNOSIS — L03032 Cellulitis of left toe: Secondary | ICD-10-CM | POA: Diagnosis not present

## 2018-11-06 DIAGNOSIS — L603 Nail dystrophy: Secondary | ICD-10-CM | POA: Diagnosis not present

## 2018-11-06 DIAGNOSIS — E1151 Type 2 diabetes mellitus with diabetic peripheral angiopathy without gangrene: Secondary | ICD-10-CM | POA: Diagnosis not present

## 2018-11-06 DIAGNOSIS — L84 Corns and callosities: Secondary | ICD-10-CM | POA: Diagnosis not present

## 2018-11-06 DIAGNOSIS — L600 Ingrowing nail: Secondary | ICD-10-CM | POA: Diagnosis not present

## 2018-11-10 DIAGNOSIS — I10 Essential (primary) hypertension: Secondary | ICD-10-CM | POA: Diagnosis not present

## 2018-11-10 DIAGNOSIS — J449 Chronic obstructive pulmonary disease, unspecified: Secondary | ICD-10-CM | POA: Diagnosis not present

## 2018-11-10 DIAGNOSIS — E782 Mixed hyperlipidemia: Secondary | ICD-10-CM | POA: Diagnosis not present

## 2018-11-10 DIAGNOSIS — E1165 Type 2 diabetes mellitus with hyperglycemia: Secondary | ICD-10-CM | POA: Diagnosis not present

## 2018-11-10 DIAGNOSIS — Z7901 Long term (current) use of anticoagulants: Secondary | ICD-10-CM | POA: Diagnosis not present

## 2018-11-11 ENCOUNTER — Encounter: Payer: Self-pay | Admitting: Orthopaedic Surgery

## 2018-11-11 ENCOUNTER — Ambulatory Visit (INDEPENDENT_AMBULATORY_CARE_PROVIDER_SITE_OTHER): Payer: Medicare Other | Admitting: Orthopaedic Surgery

## 2018-11-11 VITALS — BP 132/79 | HR 89 | Ht 67.0 in | Wt 245.0 lb

## 2018-11-11 DIAGNOSIS — F17210 Nicotine dependence, cigarettes, uncomplicated: Secondary | ICD-10-CM

## 2018-11-11 DIAGNOSIS — G8929 Other chronic pain: Secondary | ICD-10-CM

## 2018-11-11 DIAGNOSIS — M25561 Pain in right knee: Secondary | ICD-10-CM | POA: Diagnosis not present

## 2018-11-11 MED ORDER — HYDROCODONE-ACETAMINOPHEN 7.5-325 MG PO TABS: 1.0000 | ORAL_TABLET | Freq: Four times a day (QID) | ORAL | 0 refills | Status: DC | PRN

## 2018-11-11 NOTE — Progress Notes (Signed)
Patient ZO:[id number] Tracy [name], female DOB:[birth year], 57 y.o. UJW:[id number]  Chief Complaint  Patient presents with  . Knee Pain    right    HPI  Tracy [name] is a 57 y.o. female who has continued pain of the right knee.  She has swelling and popping.  She has more pain with the cold weather we are having.  She has no giving way.  She uses a cane.  She has no redness.  She has no new trauma.   Body mass index is 38.37 kg/m.  ROS  Review of Systems  Constitutional:       Patient has Diabetes Mellitus. Patient has hypertension. Patient has COPD or shortness of breath. Patient has BMI > 35. Patient has current smoking history.  HENT: Negative for congestion.   Respiratory: Positive for cough and shortness of breath.   Cardiovascular: Negative for chest pain.  Endocrine: Positive for cold intolerance.  Musculoskeletal: Positive for arthralgias, gait problem, joint swelling and myalgias.  Allergic/Immunologic: Positive for environmental allergies.  Neurological: Negative for numbness.  All other systems reviewed and are negative.   All other systems reviewed and are negative.  The following is a summary of the past history medically, past history surgically, known current medicines, social history and family history.  This information is gathered electronically by the computer from prior information and documentation.  I review this each visit and have found including this information at this point in the chart is beneficial and informative.    Past Medical History:  Diagnosis Date  . Acute ischemic stroke ([place]) 07/03/2012  . Crack cocaine use   . Diabetes ([place])   . Diabetes mellitus ([place])   . DVT (deep venous thrombosis) ([place])   . Hemiparesis, acute ([place]) 07/04/2012  . Hypertension   . LVH (left ventricular hypertrophy) 07/04/2012   Ejection fraction 60%.  . Myocardial infarct, old   . Thrombocytopenia ([place]) 07/03/2012  . Tobacco abuse     Past Surgical History:   Procedure Laterality Date  . ABDOMINAL HYSTERECTOMY    . HERNIA REPAIR      Family History  Problem Relation Age of Onset  . Heart disease Mother   . Diabetes Mother   . Stroke Mother   . Hypertension Mother   . Hypertension Father   . Heart disease Father   . Diabetes Brother     Social History Social History   Tobacco Use  . Smoking status: Current Every Day Smoker    Packs/day: 1.00    Types: Cigarettes  . Smokeless tobacco: Never Used  Substance Use Topics  . Alcohol use: No  . Drug use: No    Comment: last used 2 years ago    No Known Allergies  Current Outpatient Medications  Medication Sig Dispense Refill  . albuterol (PROVENTIL HFA;VENTOLIN HFA) 108 (90 BASE) MCG/ACT inhaler Inhale 2 puffs into the lungs every 4 (four) hours as needed for wheezing or shortness of breath. 1 Inhaler 0  . atorvastatin (LIPITOR) 10 MG tablet Take 10 mg by mouth daily.     . budesonide-formoterol (SYMBICORT) 160-4.5 MCG/ACT inhaler Inhale 2 puffs into the lungs 2 (two) times daily.    . furosemide (LASIX) 20 MG tablet Take 20 mg by mouth.    . gabapentin (NEURONTIN) 300 MG capsule Take 300 mg by mouth 3 (three) times daily.    . hydrALAZINE (APRESOLINE) 25 MG tablet Take 1 tablet (25 mg total) by mouth every 8 (eight) hours. 90 tablet 1  .  HYDROcodone-acetaminophen (NORCO) 7.5-325 MG tablet Take 1 tablet by mouth every 6 (six) hours as needed for moderate pain (Must last 30 days). 55 tablet 0  . JARDIANCE 25 MG TABS tablet 25 mg daily.     [name] lisinopril-hydrochlorothiazide (PRINZIDE,ZESTORETIC) 20-12.5 MG per tablet Take 2 tablets by mouth daily.    . metFORMIN (GLUCOPHAGE-XR) 500 MG 24 hr tablet Take 500 mg by mouth daily.    . metoprolol succinate (TOPROL-XL) 25 MG 24 hr tablet Take 25 mg by mouth daily.     [name] warfarin (COUMADIN) 2.5 MG tablet Take 2.5 mg by mouth daily. as directed  0  . warfarin (COUMADIN) 5 MG tablet Take 1 tablet (5 mg total) by mouth daily. Resume on 3/29  (Patient not taking: Reported on 08/12/2018) 30 tablet 0   No current facility-administered medications for this visit.      Physical Exam  Blood pressure 132/79, pulse 89, height 5\' 7"  (1.702 m), weight 245 lb (111.1 kg).  Constitutional: overall normal hygiene, normal nutrition, well developed, normal grooming, normal body habitus. Assistive device:cane  Musculoskeletal: gait and station Limp right, muscle tone and strength are normal, no tremors or atrophy is present.  .  Neurological: coordination overall normal.  Deep tendon reflex/nerve stretch intact.  Sensation normal.  Cranial nerves II-XII intact.   Skin:   Normal overall no scars, lesions, ulcers or rashes. No psoriasis.  Psychiatric: Alert and oriented x 3.  Recent memory intact, remote memory unclear.  Normal mood and affect. Well groomed.  Good eye contact.  Cardiovascular: overall no swelling, no varicosities, no edema bilaterally, normal temperatures of the legs and arms, no clubbing, cyanosis and good capillary refill.  Lymphatic: palpation is normal.  Right knee has effusion 1+, ROM 0 to 100 with pain more medially, crepitus is present, knee is stable, no distal edema, NV intact. Limp to the right.  All other systems reviewed and are negative   The patient has been educated about the nature of the problem(s) and counseled on treatment options.  The patient appeared to understand what I have discussed and is in agreement with it.  Encounter Diagnoses  Name Primary?  . Chronic pain of right knee Yes  . Cigarette nicotine dependence without complication     PLAN Call if any problems.  Precautions discussed.  Continue current medications.   Return to clinic 3 months   I have reviewed the [place] web site prior to prescribing narcotic medicine for this patient.   Electronically Signed Sanjuana Kava, MD 11/13/201910:02 AM

## 2018-11-18 ENCOUNTER — Other Ambulatory Visit (HOSPITAL_COMMUNITY): Payer: Self-pay | Admitting: Physician Assistant

## 2018-11-18 DIAGNOSIS — Z1231 Encounter for screening mammogram for malignant neoplasm of breast: Secondary | ICD-10-CM

## 2018-11-20 DIAGNOSIS — Z86718 Personal history of other venous thrombosis and embolism: Secondary | ICD-10-CM | POA: Diagnosis not present

## 2018-12-02 DIAGNOSIS — Z7901 Long term (current) use of anticoagulants: Secondary | ICD-10-CM | POA: Diagnosis not present

## 2018-12-03 ENCOUNTER — Ambulatory Visit (HOSPITAL_COMMUNITY): Payer: Medicare Other

## 2018-12-04 DIAGNOSIS — L600 Ingrowing nail: Secondary | ICD-10-CM | POA: Diagnosis not present

## 2018-12-08 DIAGNOSIS — H2000 Unspecified acute and subacute iridocyclitis: Secondary | ICD-10-CM | POA: Diagnosis not present

## 2018-12-09 ENCOUNTER — Telehealth: Payer: Self-pay | Admitting: Orthopaedic Surgery

## 2018-12-09 MED ORDER — HYDROCODONE-ACETAMINOPHEN 7.5-325 MG PO TABS: 1.0000 | ORAL_TABLET | Freq: Four times a day (QID) | ORAL | 0 refills | Status: DC | PRN

## 2018-12-09 NOTE — Telephone Encounter (Signed)
Hydrocodone-Acetaminophen 7.5/325 mg  Qty  55 Tablets  PATIENT USES [facility]

## 2018-12-21 ENCOUNTER — Ambulatory Visit (HOSPITAL_COMMUNITY)
Admission: RE | Admit: 2018-12-21 | Discharge: 2018-12-21 | Disposition: A | Discharge status: Home / Self Care | Payer: Medicare Other | Source: Ambulatory Visit | Attending: Physician Assistant | Admitting: Physician Assistant

## 2018-12-21 DIAGNOSIS — Z1231 Encounter for screening mammogram for malignant neoplasm of breast: Secondary | ICD-10-CM | POA: Diagnosis not present

## 2019-01-01 DIAGNOSIS — Z7901 Long term (current) use of anticoagulants: Secondary | ICD-10-CM | POA: Diagnosis not present

## 2019-01-01 DIAGNOSIS — Z86718 Personal history of other venous thrombosis and embolism: Secondary | ICD-10-CM | POA: Diagnosis not present

## 2019-01-07 ENCOUNTER — Telehealth: Payer: Self-pay | Admitting: Orthopaedic Surgery

## 2019-01-07 NOTE — Telephone Encounter (Signed)
Patient called for refill: HYDROcodone-acetaminophen (NORCO) 7.5-325 MG tablet 55 tablet  -[place]

## 2019-01-12 MED ORDER — HYDROCODONE-ACETAMINOPHEN 7.5-325 MG PO TABS: 1.0000 | ORAL_TABLET | Freq: Four times a day (QID) | ORAL | 0 refills | Status: DC | PRN

## 2019-02-01 DIAGNOSIS — Z86718 Personal history of other venous thrombosis and embolism: Secondary | ICD-10-CM | POA: Diagnosis not present

## 2019-02-01 DIAGNOSIS — Z7901 Long term (current) use of anticoagulants: Secondary | ICD-10-CM | POA: Diagnosis not present

## 2019-02-08 DIAGNOSIS — L603 Nail dystrophy: Secondary | ICD-10-CM | POA: Diagnosis not present

## 2019-02-08 DIAGNOSIS — L84 Corns and callosities: Secondary | ICD-10-CM | POA: Diagnosis not present

## 2019-02-08 DIAGNOSIS — I739 Peripheral vascular disease, unspecified: Secondary | ICD-10-CM | POA: Diagnosis not present

## 2019-02-08 DIAGNOSIS — E1151 Type 2 diabetes mellitus with diabetic peripheral angiopathy without gangrene: Secondary | ICD-10-CM | POA: Diagnosis not present

## 2019-02-10 ENCOUNTER — Encounter: Payer: Self-pay | Admitting: Orthopaedic Surgery

## 2019-02-10 ENCOUNTER — Ambulatory Visit (INDEPENDENT_AMBULATORY_CARE_PROVIDER_SITE_OTHER): Payer: Medicare Other | Admitting: Orthopaedic Surgery

## 2019-02-10 VITALS — BP 151/99 | HR 102 | Ht 67.0 in | Wt 252.0 lb

## 2019-02-10 DIAGNOSIS — G8929 Other chronic pain: Secondary | ICD-10-CM | POA: Diagnosis not present

## 2019-02-10 DIAGNOSIS — F17210 Nicotine dependence, cigarettes, uncomplicated: Secondary | ICD-10-CM | POA: Diagnosis not present

## 2019-02-10 DIAGNOSIS — M25561 Pain in right knee: Secondary | ICD-10-CM

## 2019-02-10 MED ORDER — HYDROCODONE-ACETAMINOPHEN 7.5-325 MG PO TABS: 1.0000 | ORAL_TABLET | Freq: Four times a day (QID) | ORAL | 0 refills | Status: DC | PRN

## 2019-02-10 NOTE — Progress Notes (Signed)
Patient Tracy [name], female DOB:11/20/1961, 58 y.o. OIN:[id number]  Chief Complaint  Patient presents with  . Knee Pain    right     HPI  Tracy [name] is a 58 y.o. female who has chronic right knee pain. She has more pain with the cold rainy weather we are having. She has no new trauma. She uses her cane. She has swelling and popping.   She also is short of breath today. She has wheezes.  She is still smoking, a pack a day.  She has smoked since age 64 or 80.  We had a good long talk about this.  The patient was counseled about smoking and smoking cessation.  I spent about four  minutes in doing this.  I, of course, determined the patient does smoke and frequency.  The patient smokes  A pack a day.  I have advised the patient of problems medically that can occur with continued smoking including poor wound and bone healing as well as the obvious respiratory problems.  I have assessed the patient's willingness to cut back and quit smoking.  The patient has stated she is willing to cut back as she has more shortness of breath now.  I have told the patient to discuss with their family doctor also about smoking cessation.  I am willing to assist my patient in arranging this and to get additional help.I have suggested the patient have a set date to try to begin cutting back.  I have printed out a smoking cessation form about how to begin cutting back and suggestions.  I have recommended starting an immediate plan to cut back.  I will discuss this further when I see the patient in the future.  I have stressed that although this will be very difficult to accomplish, the benefits are very substantial and will give long term health benefits from now on.   Body mass index is 39.47 kg/m.  ROS  Review of Systems  Constitutional:       Patient has Diabetes Mellitus. Patient has hypertension. Patient has COPD or shortness of breath. Patient has BMI > 35. Patient has current  smoking history.  HENT: Negative for congestion.   Respiratory: Positive for cough and shortness of breath.   Cardiovascular: Negative for chest pain.  Endocrine: Positive for cold intolerance.  Musculoskeletal: Positive for arthralgias, gait problem, joint swelling and myalgias.  Allergic/Immunologic: Positive for environmental allergies.  Neurological: Negative for numbness.  All other systems reviewed and are negative.   All other systems reviewed and are negative.  The following is a summary of the past history medically, past history surgically, known current medicines, social history and family history.  This information is gathered electronically by the computer from prior information and documentation.  I review this each visit and have found including this information at this point in the chart is beneficial and informative.    Past Medical History:  Diagnosis Date  . Acute ischemic stroke ([place]) 07/03/2012  . Crack cocaine use   . Diabetes ([place])   . Diabetes mellitus ([place])   . DVT (deep venous thrombosis) ([place])   . Hemiparesis, acute ([place]) 07/04/2012  . Hypertension   . LVH (left ventricular hypertrophy) 07/04/2012   Ejection fraction 60%.  . Myocardial infarct, old   . Thrombocytopenia ([place]) 07/03/2012  . Tobacco abuse     Past Surgical History:  Procedure Laterality Date  . ABDOMINAL HYSTERECTOMY    . HERNIA REPAIR      Family  History  Problem Relation Age of Onset  . Heart disease Mother   . Diabetes Mother   . Stroke Mother   . Hypertension Mother   . Hypertension Father   . Heart disease Father   . Diabetes Brother     Social History Social History   Tobacco Use  . Smoking status: Current Every Day Smoker    Packs/day: 1.00    Types: Cigarettes  . Smokeless tobacco: Never Used  Substance Use Topics  . Alcohol use: No  . Drug use: No    Comment: last used 2 years ago    No Known Allergies  Current Outpatient Medications  Medication Sig Dispense  Refill  . albuterol (PROVENTIL HFA;VENTOLIN HFA) 108 (90 BASE) MCG/ACT inhaler Inhale 2 puffs into the lungs every 4 (four) hours as needed for wheezing or shortness of breath. 1 Inhaler 0  . atorvastatin (LIPITOR) 10 MG tablet Take 10 mg by mouth daily.     . budesonide-formoterol (SYMBICORT) 160-4.5 MCG/ACT inhaler Inhale 2 puffs into the lungs 2 (two) times daily.    . furosemide (LASIX) 20 MG tablet Take 20 mg by mouth.    . gabapentin (NEURONTIN) 300 MG capsule Take 300 mg by mouth 3 (three) times daily.    . hydrALAZINE (APRESOLINE) 25 MG tablet Take 1 tablet (25 mg total) by mouth every 8 (eight) hours. 90 tablet 1  . HYDROcodone-acetaminophen (NORCO) 7.5-325 MG tablet Take 1 tablet by mouth every 6 (six) hours as needed for moderate pain (Must last 30 days). 60 tablet 0  . JARDIANCE 25 MG TABS tablet 25 mg daily.     [name] lisinopril-hydrochlorothiazide (PRINZIDE,ZESTORETIC) 20-12.5 MG per tablet Take 2 tablets by mouth daily.    . metFORMIN (GLUCOPHAGE-XR) 500 MG 24 hr tablet Take 500 mg by mouth daily.    . metoprolol succinate (TOPROL-XL) 25 MG 24 hr tablet Take 25 mg by mouth daily.     [name] warfarin (COUMADIN) 2.5 MG tablet Take 2.5 mg by mouth daily. as directed  0  . warfarin (COUMADIN) 5 MG tablet Take 1 tablet (5 mg total) by mouth daily. Resume on 3/29 30 tablet 0   No current facility-administered medications for this visit.      Physical Exam  Blood pressure (!) 151/99, pulse (!) 102, height 5\' 7"  (1.702 m), weight 252 lb (114.3 kg).  Constitutional: overall normal hygiene, normal nutrition, well developed, normal grooming, normal body habitus. Assistive device:cane  Musculoskeletal: gait and station Limp right, muscle tone and strength are normal, no tremors or atrophy is present.  .  Neurological: coordination overall normal.  Deep tendon reflex/nerve stretch intact.  Sensation normal.  Cranial nerves II-XII intact.   Skin:   Normal overall no scars, lesions, ulcers or  rashes. No psoriasis.  Psychiatric: Alert and oriented x 3.  Recent memory intact, remote memory unclear.  Normal mood and affect. Well groomed.  Good eye contact.  Cardiovascular: overall no swelling, no varicosities, no edema bilaterally, normal temperatures of the legs and arms, no clubbing, cyanosis and good capillary refill.  Lymphatic: palpation is normal.  The right lower extremity is examined:  Inspection:  Thigh:  Non-tender and no defects  Knee has swelling 1+ effusion.                        Joint tenderness is present  Patient is tender over the medial joint line  Lower Leg:  Has normal appearance and no tenderness or defects  Ankle:  Non-tender and no defects  Foot:  Non-tender and no defects Range of Motion:  Knee:  Range of motion is: 0-100                        Crepitus is  present  Ankle:  Range of motion is normal. Strength and Tone:  The right lower extremity has normal strength and tone. Stability:  Knee:  The knee is stable.  Ankle:  The ankle is stable. All other systems reviewed and are negative   The patient has been educated about the nature of the problem(s) and counseled on treatment options.  The patient appeared to understand what I have discussed and is in agreement with it.  Encounter Diagnoses  Name Primary?  . Chronic pain of right knee Yes  . Cigarette nicotine dependence without complication     PLAN Call if any problems.  Precautions discussed.  Continue current medications.   Return to clinic 3 months   The patient has read and signed an Opioid Treatment Agreement which has been scanned and added to the medical record.  The patient understands the agreement and agrees to abide with it.  The patient has chronic pain that is being treated with an opioid which relieves the pain.  The patient understands potential complications with chronic opioid treatment.   I have reviewed the [place] web site prior to prescribing narcotic medicine for this patient.      Electronically Signed Sanjuana Kava, MD 2/12/20209:40 AM

## 2019-02-12 DIAGNOSIS — J449 Chronic obstructive pulmonary disease, unspecified: Secondary | ICD-10-CM | POA: Diagnosis not present

## 2019-02-12 DIAGNOSIS — D519 Vitamin B12 deficiency anemia, unspecified: Secondary | ICD-10-CM | POA: Diagnosis not present

## 2019-02-12 DIAGNOSIS — I10 Essential (primary) hypertension: Secondary | ICD-10-CM | POA: Diagnosis not present

## 2019-02-12 DIAGNOSIS — E782 Mixed hyperlipidemia: Secondary | ICD-10-CM | POA: Diagnosis not present

## 2019-02-12 DIAGNOSIS — J441 Chronic obstructive pulmonary disease with (acute) exacerbation: Secondary | ICD-10-CM | POA: Diagnosis not present

## 2019-02-12 DIAGNOSIS — E1165 Type 2 diabetes mellitus with hyperglycemia: Secondary | ICD-10-CM | POA: Diagnosis not present

## 2019-03-01 DIAGNOSIS — Z7901 Long term (current) use of anticoagulants: Secondary | ICD-10-CM | POA: Diagnosis not present

## 2019-03-01 DIAGNOSIS — Z0000 Encounter for general adult medical examination without abnormal findings: Secondary | ICD-10-CM | POA: Diagnosis not present

## 2019-03-08 DIAGNOSIS — D519 Vitamin B12 deficiency anemia, unspecified: Secondary | ICD-10-CM | POA: Diagnosis not present

## 2019-03-09 DIAGNOSIS — H26492 Other secondary cataract, left eye: Secondary | ICD-10-CM | POA: Diagnosis not present

## 2019-03-09 DIAGNOSIS — H2000 Unspecified acute and subacute iridocyclitis: Secondary | ICD-10-CM | POA: Diagnosis not present

## 2019-03-09 DIAGNOSIS — H35033 Hypertensive retinopathy, bilateral: Secondary | ICD-10-CM | POA: Diagnosis not present

## 2019-03-09 DIAGNOSIS — H26491 Other secondary cataract, right eye: Secondary | ICD-10-CM | POA: Diagnosis not present

## 2019-03-09 DIAGNOSIS — Z961 Presence of intraocular lens: Secondary | ICD-10-CM | POA: Diagnosis not present

## 2019-03-10 ENCOUNTER — Telehealth: Payer: Self-pay | Admitting: Orthopaedic Surgery

## 2019-03-10 MED ORDER — HYDROCODONE-ACETAMINOPHEN 7.5-325 MG PO TABS: 1.0000 | ORAL_TABLET | Freq: Four times a day (QID) | ORAL | 0 refills | Status: DC | PRN

## 2019-03-10 NOTE — Telephone Encounter (Signed)
Patient requests refill on Hydrocodone/Acetaminophen 7.5-325  Mgs.  Qty  60  Sig: Take 1 tablet by mouth every 6 (six) hours as needed for moderate pain (Must last 30 days).  Patient states she uses Paediatric nurse

## 2019-03-15 DIAGNOSIS — D519 Vitamin B12 deficiency anemia, unspecified: Secondary | ICD-10-CM | POA: Diagnosis not present

## 2019-03-23 DIAGNOSIS — D519 Vitamin B12 deficiency anemia, unspecified: Secondary | ICD-10-CM | POA: Diagnosis not present

## 2019-03-25 DIAGNOSIS — H26492 Other secondary cataract, left eye: Secondary | ICD-10-CM | POA: Diagnosis not present

## 2019-04-01 DIAGNOSIS — Z7901 Long term (current) use of anticoagulants: Secondary | ICD-10-CM | POA: Diagnosis not present

## 2019-04-01 DIAGNOSIS — D519 Vitamin B12 deficiency anemia, unspecified: Secondary | ICD-10-CM | POA: Diagnosis not present

## 2019-04-08 ENCOUNTER — Telehealth: Payer: Self-pay | Admitting: Orthopaedic Surgery

## 2019-04-08 DIAGNOSIS — H26491 Other secondary cataract, right eye: Secondary | ICD-10-CM | POA: Diagnosis not present

## 2019-04-08 NOTE — Telephone Encounter (Signed)
Patient called for refill: HYDROcodone-acetaminophen (NORCO) 7.5-325 MG tablet 60 tab   - [place]

## 2019-04-12 MED ORDER — HYDROCODONE-ACETAMINOPHEN 7.5-325 MG PO TABS: 1.0000 | ORAL_TABLET | Freq: Four times a day (QID) | ORAL | 0 refills | Status: DC | PRN

## 2019-04-30 DIAGNOSIS — Z7901 Long term (current) use of anticoagulants: Secondary | ICD-10-CM | POA: Diagnosis not present

## 2019-04-30 DIAGNOSIS — D519 Vitamin B12 deficiency anemia, unspecified: Secondary | ICD-10-CM | POA: Diagnosis not present

## 2019-05-06 DIAGNOSIS — Z7901 Long term (current) use of anticoagulants: Secondary | ICD-10-CM | POA: Diagnosis not present

## 2019-05-11 ENCOUNTER — Ambulatory Visit: Payer: Self-pay | Admitting: Orthopaedic Surgery

## 2019-05-12 ENCOUNTER — Encounter: Payer: Self-pay | Admitting: Orthopaedic Surgery

## 2019-05-12 ENCOUNTER — Other Ambulatory Visit: Payer: Self-pay

## 2019-05-12 ENCOUNTER — Ambulatory Visit (INDEPENDENT_AMBULATORY_CARE_PROVIDER_SITE_OTHER): Payer: Medicare Other | Admitting: Orthopaedic Surgery

## 2019-05-12 DIAGNOSIS — M25561 Pain in right knee: Secondary | ICD-10-CM | POA: Diagnosis not present

## 2019-05-12 DIAGNOSIS — G8929 Other chronic pain: Secondary | ICD-10-CM

## 2019-05-12 DIAGNOSIS — F17210 Nicotine dependence, cigarettes, uncomplicated: Secondary | ICD-10-CM

## 2019-05-12 DIAGNOSIS — Z7901 Long term (current) use of anticoagulants: Secondary | ICD-10-CM

## 2019-05-12 MED ORDER — HYDROCODONE-ACETAMINOPHEN 7.5-325 MG PO TABS: 1.0000 | ORAL_TABLET | Freq: Four times a day (QID) | ORAL | 0 refills | Status: DC | PRN

## 2019-05-12 NOTE — Progress Notes (Signed)
Virtual Visit via Telephone Note  I connected with Tracy [name] on 05/12/19 at  9:00 AM EDT by telephone and verified that I am speaking with the correct person using two identifiers.  Location: Patient: home Provider: home   I discussed the limitations, risks, security and privacy concerns of performing an evaluation and management service by telephone and the availability of in person appointments. I also discussed with the patient that there may be a patient responsible charge related to this service. The patient expressed understanding and agreed to proceed.   History of Present Illness: She has chronic pain of the knee on the right.  She has good and bad days.  She has some swelling and popping but no giving way.  She has no new trauma.  She is taking her medicine.  She tries to be active. She continues to smoke and is on anti-coagulation medicine so she cannot take NSAIDs.   Observations/Objective: Per above  Assessment and Plan: Encounter Diagnoses  Name Primary?  . Chronic pain of right knee Yes  . Cigarette nicotine dependence without complication   . Current use of long term anticoagulation      Follow Up Instructions: 3 months.  I refilled her pain medicine.  I have reviewed the [place] web site prior to prescribing narcotic medicine for this patient.   Call if any problem.  Precautions discussed.      I discussed the assessment and treatment plan with the patient. The patient was provided an opportunity to ask questions and all were answered. The patient agreed with the plan and demonstrated an understanding of the instructions.   The patient was advised to call back or seek an in-person evaluation if the symptoms worsen or if the condition fails to improve as anticipated.  I provided 8 minutes of non-face-to-face time during this encounter.   Sanjuana Kava, MD

## 2019-05-13 DIAGNOSIS — D6851 Activated protein C resistance: Secondary | ICD-10-CM | POA: Diagnosis not present

## 2019-05-13 DIAGNOSIS — Z7901 Long term (current) use of anticoagulants: Secondary | ICD-10-CM | POA: Diagnosis not present

## 2019-05-17 DIAGNOSIS — E1151 Type 2 diabetes mellitus with diabetic peripheral angiopathy without gangrene: Secondary | ICD-10-CM | POA: Diagnosis not present

## 2019-05-17 DIAGNOSIS — L84 Corns and callosities: Secondary | ICD-10-CM | POA: Diagnosis not present

## 2019-05-17 DIAGNOSIS — I739 Peripheral vascular disease, unspecified: Secondary | ICD-10-CM | POA: Diagnosis not present

## 2019-05-17 DIAGNOSIS — L603 Nail dystrophy: Secondary | ICD-10-CM | POA: Diagnosis not present

## 2019-05-20 ENCOUNTER — Emergency Department (HOSPITAL_COMMUNITY)
Admission: EM | Admit: 2019-05-20 | Discharge: 2019-05-20 | Discharge status: Against Medical Advice | Payer: Medicare Other | Attending: Emergency Medicine | Admitting: Emergency Medicine

## 2019-05-20 ENCOUNTER — Emergency Department (HOSPITAL_COMMUNITY): Payer: Medicare Other

## 2019-05-20 ENCOUNTER — Encounter (HOSPITAL_COMMUNITY): Payer: Self-pay | Admitting: Emergency Medicine

## 2019-05-20 ENCOUNTER — Other Ambulatory Visit: Payer: Self-pay

## 2019-05-20 DIAGNOSIS — I252 Old myocardial infarction: Secondary | ICD-10-CM | POA: Diagnosis not present

## 2019-05-20 DIAGNOSIS — I517 Cardiomegaly: Secondary | ICD-10-CM | POA: Diagnosis not present

## 2019-05-20 DIAGNOSIS — I10 Essential (primary) hypertension: Secondary | ICD-10-CM | POA: Diagnosis not present

## 2019-05-20 DIAGNOSIS — R0902 Hypoxemia: Secondary | ICD-10-CM | POA: Diagnosis not present

## 2019-05-20 DIAGNOSIS — R0600 Dyspnea, unspecified: Secondary | ICD-10-CM | POA: Diagnosis not present

## 2019-05-20 DIAGNOSIS — Z03818 Encounter for observation for suspected exposure to other biological agents ruled out: Secondary | ICD-10-CM | POA: Insufficient documentation

## 2019-05-20 DIAGNOSIS — E119 Type 2 diabetes mellitus without complications: Secondary | ICD-10-CM | POA: Insufficient documentation

## 2019-05-20 DIAGNOSIS — F17210 Nicotine dependence, cigarettes, uncomplicated: Secondary | ICD-10-CM | POA: Insufficient documentation

## 2019-05-20 DIAGNOSIS — R600 Localized edema: Secondary | ICD-10-CM | POA: Diagnosis not present

## 2019-05-20 DIAGNOSIS — J441 Chronic obstructive pulmonary disease with (acute) exacerbation: Secondary | ICD-10-CM

## 2019-05-20 DIAGNOSIS — Z7901 Long term (current) use of anticoagulants: Secondary | ICD-10-CM | POA: Diagnosis not present

## 2019-05-20 DIAGNOSIS — R0602 Shortness of breath: Secondary | ICD-10-CM | POA: Diagnosis present

## 2019-05-20 LAB — CBC WITH DIFFERENTIAL/PLATELET
Abs Immature Granulocytes: 0.02 10*3/uL (ref 0.00–0.07)
Basophils Absolute: 0.1 10*3/uL (ref 0.0–0.1)
Basophils Relative: 1 %
Eosinophils Absolute: 0.2 10*3/uL (ref 0.0–0.5)
Eosinophils Relative: 2 %
HCT: 47 % — ABNORMAL HIGH (ref 36.0–46.0)
Hemoglobin: 14 g/dL (ref 12.0–15.0)
Immature Granulocytes: 0 %
Lymphocytes Relative: 21 %
Lymphs Abs: 1.7 10*3/uL (ref 0.7–4.0)
MCH: 26.5 pg (ref 26.0–34.0)
MCHC: 29.8 g/dL — ABNORMAL LOW (ref 30.0–36.0)
MCV: 89 fL (ref 80.0–100.0)
Monocytes Absolute: 0.4 10*3/uL (ref 0.1–1.0)
Monocytes Relative: 5 %
Neutro Abs: 5.6 10*3/uL (ref 1.7–7.7)
Neutrophils Relative %: 71 %
Platelets: 161 10*3/uL (ref 150–400)
RBC: 5.28 MIL/uL — ABNORMAL HIGH (ref 3.87–5.11)
RDW: 17.1 % — ABNORMAL HIGH (ref 11.5–15.5)
WBC: 7.8 10*3/uL (ref 4.0–10.5)
nRBC: 0 % (ref 0.0–0.2)

## 2019-05-20 LAB — BASIC METABOLIC PANEL
Anion gap: 11 (ref 5–15)
BUN: 16 mg/dL (ref 6–20)
CO2: 26 mmol/L (ref 22–32)
Calcium: 8.4 mg/dL — ABNORMAL LOW (ref 8.9–10.3)
Chloride: 101 mmol/L (ref 98–111)
Creatinine, Ser: 1.12 mg/dL — ABNORMAL HIGH (ref 0.44–1.00)
GFR calc Af Amer: >60 mL/min (ref >60)
GFR calc non Af Amer: 54 mL/min — ABNORMAL LOW (ref >60)
Glucose, Bld: 118 mg/dL — ABNORMAL HIGH (ref 70–99)
Potassium: 3.1 mmol/L — ABNORMAL LOW (ref 3.5–5.1)
Sodium: 138 mmol/L (ref 135–145)

## 2019-05-20 LAB — SARS CORONAVIRUS 2 BY RT PCR (HOSPITAL ORDER, PERFORMED IN ~~LOC~~ HOSPITAL LAB): SARS Coronavirus 2: NEGATIVE

## 2019-05-20 LAB — BRAIN NATRIURETIC PEPTIDE: B Natriuretic Peptide: 248 pg/mL — ABNORMAL HIGH (ref 0.0–100.0)

## 2019-05-20 LAB — TROPONIN I: Troponin I: 0.07 ng/mL (ref <0.03)

## 2019-05-20 MED ORDER — IOHEXOL 350 MG/ML SOLN: 100.0000 mL | Freq: Once | INTRAVENOUS | Status: DC | PRN

## 2019-05-20 NOTE — ED Provider Notes (Signed)
Patient opted to leave AMA.  I discussed the options with her including the possibility of death.  She was mentating appropriately.  She still choose to leave the hospital.  RN confirms patient's desire to leave [facility]   Nat Christen, MD 05/20/19 [phone]

## 2019-05-20 NOTE — ED Triage Notes (Signed)
Patient sent by PCP for low oxygen concentration, in 80s per patient report.

## 2019-05-20 NOTE — ED Notes (Signed)
Pt refused to use a pure wick. Demanded to walk to the bathroom.

## 2019-05-20 NOTE — ED Provider Notes (Signed)
Emergency Department Provider Note   I have reviewed the triage vital signs and the nursing notes.   HISTORY  Chief Complaint Shortness of Breath   HPI Tracy [name] is a 58 y.o. female with PMH of CVA, DVT on Coumadin, HTN, LVH, and COPD presents to the emergency department for evaluation of asymptomatic hypoxemia.  Patient was seen today in the office for an INR check.  During routine vitals she was found to have an oxygen saturation of 86%.  Patient states that due to her COPD she frequently feels shortness of breath but today was baseline for her.  She denies significant dyspnea with exertion.  No cough, chest pain, fevers, heart palpitations.  Given the low oxygen saturation she was referred to the emergency department.  She tells me that her INR was 2.4 today.  She reports that her PCP has talked to her about her oxygen levels.  She one-time had home oxygen but found the oxygen very difficult to manage at her house due to lack of space and so she sent it back.    Past Medical History:  Diagnosis Date  . Acute ischemic stroke ([place]) 07/03/2012  . Crack cocaine use   . Diabetes ([place])   . Diabetes mellitus ([place])   . DVT (deep venous thrombosis) ([place])   . Hemiparesis, acute ([place]) 07/04/2012  . Hypertension   . LVH (left ventricular hypertrophy) 07/04/2012   Ejection fraction 60%.  . Myocardial infarct, old   . Thrombocytopenia ([place]) 07/03/2012  . Tobacco abuse     Patient Active Problem List   Diagnosis Date Noted  . Left knee pain 03/05/2016  . Influenza with respiratory manifestations 03/22/2015  . COPD exacerbation ([place]) 03/21/2015  . CAP (community acquired pneumonia) 07/04/2013  . Sepsis ([place]) 07/04/2013  . Acute renal failure ([place]) 07/04/2013  . Left thyroid nodule 07/04/2013  . Essential hypertension 07/04/2013  . Diabetes ([place]) 07/04/2013  . Acute respiratory failure ([place]) 07/04/2013  . Hemiparesis, acute ([place]) 07/04/2012  . LVH (left ventricular hypertrophy)  07/04/2012  . Acute ischemic stroke ([place]) 07/03/2012  . Hypertensive urgency 07/03/2012  . Tobacco abuse 07/03/2012  . Noncompliance 07/03/2012  . Thrombocytopenia ([place]) 07/03/2012  . Crack cocaine use 07/03/2012    Past Surgical History:  Procedure Laterality Date  . ABDOMINAL HYSTERECTOMY    . HERNIA REPAIR      Allergies Patient has no known allergies.  Family History  Problem Relation Age of Onset  . Heart disease Mother   . Diabetes Mother   . Stroke Mother   . Hypertension Mother   . Hypertension Father   . Heart disease Father   . Diabetes Brother     Social History Social History   Tobacco Use  . Smoking status: Current Every Day Smoker    Packs/day: 1.00    Types: Cigarettes  . Smokeless tobacco: Never Used  Substance Use Topics  . Alcohol use: No  . Drug use: No    Comment: last used 2 years ago    Review of Systems  Constitutional: No fever/chills Eyes: No visual changes. ENT: No sore throat. Cardiovascular: Denies chest pain. Respiratory: Positive baseline shortness of breath. Gastrointestinal: No abdominal pain.  No nausea, no vomiting.  No diarrhea.  No constipation. Genitourinary: Negative for dysuria. Musculoskeletal: Negative for back pain. Skin: Negative for rash. Neurological: Negative for headaches, focal weakness or numbness.  10-point ROS otherwise negative.  ____________________________________________   PHYSICAL EXAM:  VITAL SIGNS: ED Triage Vitals  Enc  Vitals Group     BP 05/20/19 1318 (!) 149/105     Pulse Rate 05/20/19 1318 94     Resp 05/20/19 1318 (!) 22     Temp 05/20/19 1318 98 F (36.7 C)     Temp Source 05/20/19 1318 Oral     SpO2 05/20/19 1318 91 %     Weight 05/20/19 1319 247 lb (112 kg)     Height 05/20/19 1319 5\' 6"  (1.676 m)   Constitutional: Alert and oriented. Well appearing and in no acute distress. Eyes: Conjunctivae are normal.  Head: Atraumatic. Nose: No congestion/rhinnorhea. Mouth/Throat:  Mucous membranes are moist.  Neck: No stridor.  Cardiovascular: Normal rate, regular rhythm. Good peripheral circulation. Grossly normal heart sounds.   Respiratory: Normal respiratory effort.  No retractions. Lungs CTAB. Gastrointestinal: Soft and nontender. No distention.  Musculoskeletal: No lower extremity tenderness with 2+ pitting edema bilaterally. No gross deformities of extremities. Neurologic:  Normal speech and language. No gross focal neurologic deficits are appreciated.  Skin:  Skin is warm, dry and intact. No rash noted.  ____________________________________________   LABS (all labs ordered are listed, but only abnormal results are displayed)  Labs Reviewed  BASIC METABOLIC PANEL - Abnormal; Notable for the following components:      Result Value   Potassium 3.1 (*)    Glucose, Bld 118 (*)    Creatinine, Ser 1.12 (*)    Calcium 8.4 (*)    GFR calc non Af Amer 54 (*)    All other components within normal limits  BRAIN NATRIURETIC PEPTIDE - Abnormal; Notable for the following components:   B Natriuretic Peptide 248.0 (*)    All other components within normal limits  TROPONIN I - Abnormal; Notable for the following components:   Troponin I 0.07 (*)    All other components within normal limits  CBC WITH DIFFERENTIAL/PLATELET - Abnormal; Notable for the following components:   RBC 5.28 (*)    HCT 47.0 (*)    MCHC 29.8 (*)    RDW 17.1 (*)    All other components within normal limits  SARS CORONAVIRUS 2 (HOSPITAL ORDER, [place] [facility])   ____________________________________________  EKG   EKG Interpretation  Date/Time:  Thursday May 20 2019 14:23:10 EDT Ventricular Rate:  83 PR Interval:    QRS Duration: 169 QT Interval:  430 QTC Calculation: 506 R Axis:   59 Text Interpretation:  Sinus rhythm Multiple premature complexes, vent & supraven Right bundle branch block No STEMI. Similar to prior.  Confirmed by Nanda Quinton [id number]) on  05/20/2019 2:31:38 PM       ____________________________________________  RADIOLOGY  CXR reviewed.   CTA chest pending.  ____________________________________________   PROCEDURES  Procedure(s) performed:   Procedures  CRITICAL CARE Performed by: Margette Fast Total critical care time: 35 minutes Critical care time was exclusive of separately billable procedures and treating other patients. Critical care was necessary to treat or prevent imminent or life-threatening deterioration. Critical care was time spent personally by me on the following activities: development of treatment plan with patient and/or surrogate as well as nursing, discussions with consultants, evaluation of patient's response to treatment, examination of patient, obtaining history from patient or surrogate, ordering and performing treatments and interventions, ordering and review of laboratory studies, ordering and review of radiographic studies, pulse oximetry and re-evaluation of patient's condition.  Nanda Quinton, MD Emergency Medicine  ____________________________________________   INITIAL IMPRESSION / ASSESSMENT AND PLAN / ED COURSE  Pertinent  labs & imaging results that were available during my care of the patient were reviewed by me and considered in my medical decision making (see chart for details).   Patient presents to the emergency department for evaluation of hypoxia in the setting of known COPD.  She has qualified for home oxygen in the past but sent it back due to the tank being cumbersome at home.  Suspect that this is chronic hypoxemia but do plan on screening blood work and chest x-ray.  Patient's INR was checked earlier today so will not repeat this as the patient is able to recall the value from today.  No significant wheezing on exam.  No crackles.  No evidence of acute volume overload. Sending COVID testing as well in case admission is required. Plan for ambulatory pulse ox and reassess.  Will engage Case Manager regarding home O2 but patient is not willing to accept home O2 if a tank is required.  02:15 PM  O2 sats drop to 80% on RA with ambulation but no worsening SOB compared to prior.   I was able to secure home O2 for the patient via social work. Labs reviewed. Discussed with patient that I would want to trend troponin and obtain CT chest. She is in agreement. Doubt ACS as the primary issue here but cannot r/o PE. If negative, would discharge with home O2.  ____________________________________________  FINAL CLINICAL IMPRESSION(S) / ED DIAGNOSES  Final diagnoses:  Hypoxia  COPD exacerbation ([place])    Note:  This document was prepared using Dragon voice recognition software and may include unintentional dictation errors.  Nanda Quinton, MD Emergency Medicine    , Wonda Olds, MD 05/21/19 1520

## 2019-05-20 NOTE — Clinical Social Work Note (Signed)
Per Dr request, spoke to pt about home O2.  States she lives in small apartment and has no room for tanks, but if she could get a concentrator she would be open to it.  I called [name] with [employer] who stated he would be able to work with the patient.  I confirmed her name, number and address and gave that to [facility], who will follow up with pt later today or tomoirow.  Pt is comfortable going home without O2 today.

## 2019-05-20 NOTE — Discharge Instructions (Addendum)
You are leaving the facility [facility].  This means you could have an adverse outcome including death.  You were seen today with low oxygen levels.  We were able to arrange home oxygen.  Follow-up with your primary care physician and return to the emergency department any new or worsening symptoms.

## 2019-05-20 NOTE — ED Notes (Signed)
RN saw pt getting up from bed. RN went in to help pt and pt stated, "I'm leaving, get all this wires off of me". Pt said she had to leave because her daughter had to get to work at [employer] and if she didn't leave now she wouldn't have a ride home. Pt encouraged to stay by the RN, but pt adamantly refused. RN informed Dr. Lacinda Axon who also came to speak with pt and encouraged her to stay, but pt still refused. Pt's IV removed. Pt refused updated vitals. Pt did sign AMA paperwork. Pt taken out to car via wheelchair.

## 2019-05-20 NOTE — ED Notes (Signed)
Pt ambulated to restroom on RA. O2 sats decreased to 80% and HR increased to 116bpm. Pt had increased work of breathing but reported it was "no worse than my normal". Pt returned to ED stretcher and placed on 2L O2 via [place] and O2 sats increased to 92% and HR decreased to 96. Will continue to monitor.

## 2019-05-20 NOTE — ED Notes (Signed)
Date and time results received: 05/20/19 1444  Test: Troponin Critical Value: 0.07  Name of Provider Notified: Dr. Laverta Baltimore  Orders Received? Or Actions Taken?: See chart

## 2019-05-21 DIAGNOSIS — J449 Chronic obstructive pulmonary disease, unspecified: Secondary | ICD-10-CM | POA: Diagnosis not present

## 2019-05-28 DIAGNOSIS — Z7901 Long term (current) use of anticoagulants: Secondary | ICD-10-CM | POA: Diagnosis not present

## 2019-05-28 DIAGNOSIS — J449 Chronic obstructive pulmonary disease, unspecified: Secondary | ICD-10-CM | POA: Diagnosis not present

## 2019-05-28 DIAGNOSIS — D519 Vitamin B12 deficiency anemia, unspecified: Secondary | ICD-10-CM | POA: Diagnosis not present

## 2019-05-28 DIAGNOSIS — I10 Essential (primary) hypertension: Secondary | ICD-10-CM | POA: Diagnosis not present

## 2019-05-28 DIAGNOSIS — E782 Mixed hyperlipidemia: Secondary | ICD-10-CM | POA: Diagnosis not present

## 2019-05-28 DIAGNOSIS — E1165 Type 2 diabetes mellitus with hyperglycemia: Secondary | ICD-10-CM | POA: Diagnosis not present

## 2019-05-29 DIAGNOSIS — E119 Type 2 diabetes mellitus without complications: Secondary | ICD-10-CM | POA: Diagnosis not present

## 2019-05-29 DIAGNOSIS — I10 Essential (primary) hypertension: Secondary | ICD-10-CM | POA: Diagnosis not present

## 2019-06-01 DIAGNOSIS — J449 Chronic obstructive pulmonary disease, unspecified: Secondary | ICD-10-CM | POA: Diagnosis not present

## 2019-06-01 DIAGNOSIS — D6851 Activated protein C resistance: Secondary | ICD-10-CM | POA: Diagnosis not present

## 2019-06-01 DIAGNOSIS — E782 Mixed hyperlipidemia: Secondary | ICD-10-CM | POA: Diagnosis not present

## 2019-06-01 DIAGNOSIS — Z7901 Long term (current) use of anticoagulants: Secondary | ICD-10-CM | POA: Diagnosis not present

## 2019-06-01 DIAGNOSIS — I10 Essential (primary) hypertension: Secondary | ICD-10-CM | POA: Diagnosis not present

## 2019-06-02 ENCOUNTER — Telehealth: Payer: Self-pay | Admitting: Student

## 2019-06-02 NOTE — Telephone Encounter (Signed)
Virtual Visit Pre-Appointment Phone Call  "(Name), I am calling you today to discuss your upcoming appointment. We are currently trying to limit exposure to the virus that causes COVID-19 by seeing patients at home rather than in the office."  "What is the BEST phone number to call the day of the visit?" -   [phone]  1. Do you have or have access to (through a family member/friend) a smartphone with video capability that we can use for your visit?" a. If yes - list this number in appt notes as cell (if different from BEST phone #) and list the appointment type as a VIDEO visit in appointment notes b. If no - list the appointment type as a PHONE visit in appointment notes  2. Confirm consent - "In the setting of the current Covid19 crisis, you are scheduled for a (phone or video) visit with your provider on (date) at (time).  Just as we do with many in-office visits, in order for you to participate in this visit, we must obtain consent.  If you'd like, I can send this to your mychart (if signed up) or email for you to review.  Otherwise, I can obtain your verbal consent now.  All virtual visits are billed to your insurance company just like a normal visit would be.  By agreeing to a virtual visit, we'd like you to understand that the technology does not allow for your provider to perform an examination, and thus may limit your provider's ability to fully assess your condition. If your provider identifies any concerns that need to be evaluated in person, we will make arrangements to do so.  Finally, though the technology is pretty good, we cannot assure that it will always work on either your or our end, and in the setting of a video visit, we may have to convert it to a phone-only visit.  In either situation, we cannot ensure that we have a secure connection.  Are you willing to proceed?" STAFF: Did the patient verbally acknowledge consent to telehealth visit? Document YES/NO here:    YES  3. Advise patient to be prepared - "Two hours prior to your appointment, go ahead and check your blood pressure, pulse, oxygen saturation, and your weight (if you have the equipment to check those) and write them all down. When your visit starts, your provider will ask you for this information. If you have an Apple Watch or Kardia device, please plan to have heart rate information ready on the day of your appointment. Please have a pen and paper handy nearby the day of the visit as well."  4. Give patient instructions for MyChart download to smartphone OR Doximity/Doxy.me as below if video visit (depending on what platform provider is using)  5. Inform patient they will receive a phone call 15 minutes prior to their appointment time (may be from unknown caller ID) so they should be prepared to answer    TELEPHONE CALL NOTE  Tracy [name] has been deemed a candidate for a follow-up tele-health visit to limit community exposure during the Covid-19 pandemic. I spoke with the patient via phone to ensure availability of phone/video source, confirm preferred email & phone number, and discuss instructions and expectations.  I reminded Haniah Shryock to be prepared with any vital sign and/or heart rhythm information that could potentially be obtained via home monitoring, at the time of her visit. I reminded Shenae Bonanno to expect a phone call prior to her visit.  Vicky  T Slaughter 06/02/2019 8:42 AM   INSTRUCTIONS FOR DOWNLOADING THE MYCHART APP TO SMARTPHONE  - The patient must first make sure to have activated MyChart and know their login information - If Apple, go to [employer] and type in MyChart in the search bar and download the app. If Android, ask patient to go to [name] and type in [place] in the search bar and download the app. The app is free but as with any other app downloads, their phone may require them to verify saved payment information or Apple/Android  password.  - The patient will need to then log into the app with their MyChart username and password, and select [facility] as their healthcare provider to link the account. When it is time for your visit, go to the MyChart app, find appointments, and click Begin Video Visit. Be sure to Select Allow for your device to access the Microphone and Camera for your visit. You will then be connected, and your provider will be with you shortly.  **If they have any issues connecting, or need assistance please contact MyChart service desk [phone])**  **If using a computer, in order to ensure the best quality for their visit they will need to use either of the following Internet Browsers: [employer], or Google Chrome**  IF USING DOXIMITY or DOXY.ME - The patient will receive a link just prior to their visit by text.     FULL LENGTH CONSENT FOR TELE-HEALTH VISIT   I hereby voluntarily request, consent and authorize [facility] and its employed or contracted physicians, physician assistants, nurse practitioners or other licensed health care professionals (the Practitioner), to provide me with telemedicine health care services (the Services") as deemed necessary by the treating Practitioner. I acknowledge and consent to receive the Services by the Practitioner via telemedicine. I understand that the telemedicine visit will involve communicating with the Practitioner through live audiovisual communication technology and the disclosure of certain medical information by electronic transmission. I acknowledge that I have been given the opportunity to request an in-person assessment or other available alternative prior to the telemedicine visit and am voluntarily participating in the telemedicine visit.  I understand that I have the right to withhold or withdraw my consent to the use of telemedicine in the course of my care at any time, without affecting my right to future care or treatment,  and that the Practitioner or I may terminate the telemedicine visit at any time. I understand that I have the right to inspect all information obtained and/or recorded in the course of the telemedicine visit and may receive copies of available information for a reasonable fee.  I understand that some of the potential risks of receiving the Services via telemedicine include:   Delay or interruption in medical evaluation due to technological equipment failure or disruption;  Information transmitted may not be sufficient (e.g. poor resolution of images) to allow for appropriate medical decision making by the Practitioner; and/or   In rare instances, security protocols could fail, causing a breach of personal health information.  Furthermore, I acknowledge that it is my responsibility to provide information about my medical history, conditions and care that is complete and accurate to the best of my ability. I acknowledge that Practitioner's advice, recommendations, and/or decision may be based on factors not within their control, such as incomplete or inaccurate data provided by me or distortions of diagnostic images or specimens that may result from electronic transmissions. I understand that the practice  of medicine is not an Chief Strategy Officer and that Practitioner makes no warranties or guarantees regarding treatment outcomes. I acknowledge that I will receive a copy of this consent concurrently upon execution via email to the email address I last provided but may also request a printed copy by calling the office of [facility].    I understand that my insurance will be billed for this visit.   I have read or had this consent read to me.  I understand the contents of this consent, which adequately explains the benefits and risks of the Services being provided via telemedicine.   I have been provided ample opportunity to ask questions regarding this consent and the Services and have had my questions  answered to my satisfaction.  I give my informed consent for the services to be provided through the use of telemedicine in my medical care  By participating in this telemedicine visit I agree to the above.

## 2019-06-04 ENCOUNTER — Encounter: Payer: Self-pay | Admitting: Student

## 2019-06-04 ENCOUNTER — Other Ambulatory Visit: Payer: Self-pay

## 2019-06-04 ENCOUNTER — Telehealth (INDEPENDENT_AMBULATORY_CARE_PROVIDER_SITE_OTHER): Payer: Medicare Other | Admitting: Student

## 2019-06-04 VITALS — Ht 66.0 in | Wt 243.0 lb

## 2019-06-04 DIAGNOSIS — Z86718 Personal history of other venous thrombosis and embolism: Secondary | ICD-10-CM

## 2019-06-04 DIAGNOSIS — I10 Essential (primary) hypertension: Secondary | ICD-10-CM

## 2019-06-04 DIAGNOSIS — I119 Hypertensive heart disease without heart failure: Secondary | ICD-10-CM | POA: Diagnosis not present

## 2019-06-04 DIAGNOSIS — E782 Mixed hyperlipidemia: Secondary | ICD-10-CM

## 2019-06-04 DIAGNOSIS — Z7189 Other specified counseling: Secondary | ICD-10-CM

## 2019-06-04 DIAGNOSIS — R0609 Other forms of dyspnea: Secondary | ICD-10-CM

## 2019-06-04 NOTE — Patient Instructions (Addendum)
Limit daily fluid intake to less than 2 Liters per day. Please limit salt intake.  Please weight yourself every morning. Take an extra Lasix tablet if weight increases by 3 pounds overnight or 5 pounds in a single week.   Medication Instructions: Your physician recommends that you continue on your current medications as directed. Please refer to the Current Medication list given to you today. See above note regarding extra lasix  Labwork: none  Procedures/Testing: none  Follow-Up:  6 months with Dr.Koneswaran  Any Additional Special Instructions Will Be Listed Below (If Applicable).     If you need a refill on your cardiac medications before your next appointment, please call your pharmacy.       Thank you for choosing [facility] !

## 2019-06-04 NOTE — Patient Outreach (Signed)
[facility]) Care Management  06/04/2019  Tracy [name] December 17, 1961 [id number]   Medication Adherence call to Tracy [name] patient did not answer voice mail not set up. Tracy [name] is showing past due on Atorvastatin 10 mg under [place].   [facility] Management Direct Dial [phone]  Fax [phone] [email or web address]

## 2019-06-04 NOTE — Progress Notes (Signed)
Virtual Visit via Telephone Note   This visit type was conducted due to national recommendations for restrictions regarding the COVID-19 Pandemic (e.g. social distancing) in an effort to limit this patient's exposure and mitigate transmission in our community.  Due to her co-morbid illnesses, this patient is at least at moderate risk for complications without adequate follow up.  This format is felt to be most appropriate for this patient at this time.  The patient did not have access to video technology/had technical difficulties with video requiring transitioning to audio format only (telephone).  All issues noted in this document were discussed and addressed.  No physical exam could be performed with this format.  Please refer to the patient's chart for her  consent to telehealth for [facility].   Date:  06/04/2019   ID:  Tracy [name], DOB [birth year], MRN [id number]  Patient Location: Home Provider Location: Office  PCP:  Rory Percy, MD  Cardiologist:  Kate Sable, MD  Electrophysiologist:  None   Evaluation Performed:  Follow-Up Visit  Chief Complaint: Recent Emergency Dept Visit  History of Present Illness:    Tracy [name] is a 58 y.o. 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   She was last examined by Dr. Bronson Ing in 04/2018 and was overalll doing well from a cardiac perspective at that time, denying any recurrent chest pain. Recent NST was low-risk and she was informed to follow-up on an as-needed basis.   In the interim, she was recently evaluated at [facility] ED on 05/20/2019 after being found to be hypoxic with saturations in the 80's when checked by her PCP. She reported having baseline dyspnea on exertion given her known  COPD but denied any associated chest pain, palpitations, fever, chills, or cough.  Labs showed WBC 7.8, Hgb 14.0, platelets 161, Na+ 138, K+ 3.1, and creatinine 1.12. Initial troponin value was 0.07. BNP was elevated to 248.  COVID testing was negative.  CXR showed no active cardiopulmonary disease. EKG showed NSR, HR 83, with PVC's and RBBB. It was recommended she have repeat troponin values and a Chest CT but she left AMA.   In talking with the patient today, she reports much improvement in her symptoms as compared to when she was in the Emergency Department as she was able to obtain an oxygen concentrator. Has been using 2 L [place]. Still has dyspnea on exertion but says this has significantly improved.   She denies any recent chest pain or palpitations. No orthopnea, PND, dizziness, or presyncope. She was experiencing worsening lower extremity edema last week and took an extra Lasix tablet for several days with resolution of her symptoms. She is now back taking 20mg  daily. Does not weigh herself daily but this has declined by 9 lbs since 01/2019 (previously 252 lbs ---> now 243 lbs).   The patient does not have symptoms concerning for COVID-19 infection (fever, chills, cough, or new shortness of breath). She does have baseline dyspnea which has improved.     Past Medical History:  Diagnosis Date  . Acute ischemic stroke ([place]) 07/03/2012  . Crack cocaine use   . Diabetes ([place])   . Diabetes mellitus ([place])   . DVT (deep venous thrombosis) ([place])   . Hemiparesis, acute ([place]) 07/04/2012  . Hypertension   .  LVH (left ventricular hypertrophy) 07/04/2012   Ejection fraction 60%.  . Myocardial infarct, old   . Thrombocytopenia ([place]) 07/03/2012  . Tobacco abuse    Past Surgical History:  Procedure Laterality Date  . ABDOMINAL HYSTERECTOMY    . HERNIA REPAIR       Current Meds  Medication Sig  . albuterol (PROVENTIL HFA;VENTOLIN HFA) 108 (90 BASE) MCG/ACT inhaler Inhale 2 puffs into the lungs every 4  (four) hours as needed for wheezing or shortness of breath.  [name] atorvastatin (LIPITOR) 10 MG tablet Take 10 mg by mouth daily.   . budesonide-formoterol (SYMBICORT) 160-4.5 MCG/ACT inhaler Inhale 2 puffs into the lungs 2 (two) times daily.  . furosemide (LASIX) 20 MG tablet Take 20 mg by mouth.  . gabapentin (NEURONTIN) 300 MG capsule Take 300 mg by mouth 3 (three) times daily.  . hydrALAZINE (APRESOLINE) 25 MG tablet Take 1 tablet (25 mg total) by mouth every 8 (eight) hours. (Patient taking differently: Take 25 mg by mouth daily. )  . HYDROcodone-acetaminophen (NORCO) 7.5-325 MG tablet Take 1 tablet by mouth every 6 (six) hours as needed for moderate pain (Must last 30 days). (Patient taking differently: Take 1 tablet by mouth 2 (two) times a day. )  . JARDIANCE 25 MG TABS tablet Take 25 mg by mouth daily.   [name] lisinopril-hydrochlorothiazide (PRINZIDE,ZESTORETIC) 20-12.5 MG per tablet Take 2 tablets by mouth daily.  . metFORMIN (GLUCOPHAGE-XR) 500 MG 24 hr tablet Take 1,000 mg by mouth 2 (two) times a day.   . metFORMIN (GLUMETZA) 1000 MG (MOD) 24 hr tablet Take 1 tablet by mouth 2 (two) times a day.  . metoprolol succinate (TOPROL-XL) 25 MG 24 hr tablet Take 25 mg by mouth daily.   [name] SPIRIVA RESPIMAT 2.5 MCG/ACT AERS Inhale 1 puff into the lungs daily.  [name] warfarin (COUMADIN) 2.5 MG tablet Take 2.5 mg by mouth daily. as directed     Allergies:   Patient has no known allergies.   Social History   Tobacco Use  . Smoking status: Current Every Day Smoker    Packs/day: 1.00    Types: Cigarettes  . Smokeless tobacco: Never Used  Substance Use Topics  . Alcohol use: No  . Drug use: No    Comment: last used 2 years ago     Family Hx: The patient's family history includes Diabetes in her brother and mother; Heart disease in her father and mother; Hypertension in her father and mother; Stroke in her mother.  ROS:   Please see the history of present illness.     All other systems reviewed  and are negative.   Prior CV studies:   The following studies were reviewed today:  Echocardiogram: 08/2017 Study Conclusions  - Left ventricle: The cavity size was normal. Wall thickness was   increased in a pattern of moderate LVH. Systolic function was   vigorous. The estimated ejection fraction was in the range of 65%   to 70%. Wall motion was normal; there were no regional wall   motion abnormalities. Doppler parameters are consistent with   abnormal left ventricular relaxation (grade 1 diastolic   dysfunction). - Aortic valve: Mildly calcified annulus. Normal thickness   leaflets. Valve area (VTI): 3.46 cm^2. Valve area (Vmax): 3.19   cm^2. Valve area (Vmean): 3.02 cm^2. - Right ventricle: The cavity size was mildly to moderately   dilated. - Atrial septum: No defect or patent foramen ovale was identified. - Pulmonary arteries: Systolic pressure was moderately increased.  PA peak pressure: 50 mm Hg (S). - Technically adequate study.  NST: 01/2018  There was no ST segment deviation noted during stress.  Defect 1: There is a small defect of mild severity present in the mid anterolateral location. This is due to soft tissue attenuation. Regional wall motion is normal. Stress images are better than rest. No evidence of ischemia.  This is a low risk study.  Nuclear stress EF: 75%.  Labs/Other Tests and Data Reviewed:    EKG:  An ECG dated 05/20/2019 was personally reviewed today and demonstrated:  NSR, HR 83, with PVC's and RBBB.  Recent Labs: 05/20/2019: B Natriuretic Peptide 248.0; BUN 16; Creatinine, Ser 1.12; Hemoglobin 14.0; Platelets 161; Potassium 3.1; Sodium 138   Recent Lipid Panel Lab Results  Component Value Date/Time   CHOL 150 07/04/2012 05:23 AM   TRIG 65 07/04/2012 05:23 AM   HDL 62 07/04/2012 05:23 AM   CHOLHDL 2.4 07/04/2012 05:23 AM   LDLCALC 75 07/04/2012 05:23 AM    Wt Readings from Last 3 Encounters:  06/04/19 243 lb (110.2 kg)  05/20/19  247 lb (112 kg)  02/10/19 252 lb (114.3 kg)     Objective:    Vital Signs:  Ht 5\' 6"  (1.676 m)   Wt 243 lb (110.2 kg)   BMI 39.22 kg/m    General: Pleasant female sounding in NAD Psych: Normal affect. Neuro: Alert and oriented X 3. Lungs:  Resp regular and unlabored while talking on the phone. Currently on 2L [place].   ASSESSMENT & PLAN:    1. Hypertensive Heart Disease - BNP was mildly elevated at 248 when checked in the ED last month. She was having associated edema at that time and reports her edema has now resolved after taking extra Lasix tablets for several days.  - her weight has declined by 9 lbs within the past 4 months and given resolution of her symptoms, I recommended she continue on her baseline dose of Lasix 20mg  daily with instructions to take an additional tablet for weight gain greater than 3 lbs overnight or 5 lbs in one week. Sodium and fluid restriction reviewed.   2. Dyspnea on Exertion - likely multifactorial in the setting of COPD, hypertensive heart disease, and continued tobacco use. NST last year showed no significant ischemia as discussed above and she denies any associated chest discomfort. Symptoms have significantly improved since starting supplemental oxygen. Continue with Lasix 20mg  daily.   3. HTN - she does not have a BP cuff at home but reports this was well-controlled when checked by her PCP earlier this week.  - remains on Hydralazine 25mg  TID, Lisinopril-HCTZ 20-12.5mg  BID and Toprol-XL 25mg  daily. Ideally, she should not be on both HCTZ and Lasix but she previously had difficulty with urination when HCTZ was discontinued. Creatinine was stable at 1.12 when checked last month. Would continue to follow renal function closely with this. If starting to worsen in the future, would recommend stopping HCTZ.   4. HLD - followed by PCP. She remains on Atorvastatin 10mg  daily.   5. History of DVT - she has a known coagulation disorder and therefore it was  previously recommended to be on lifelong Coumadin. Denies any evidence of active bleeding. INR followed by PCP.   6. COVID-19 Education - The signs and symptoms of COVID-19 were discussed with the patient. The importance of social distancing was discussed today.  Time:   Today, I have spent 17 minutes with the patient with telehealth technology discussing  the above problems.     Medication Adjustments/Labs and Tests Ordered: Current medicines are reviewed at length with the patient today.  Concerns regarding medicines are outlined above.   Tests Ordered: No orders of the defined types were placed in this encounter.   Medication Changes: No orders of the defined types were placed in this encounter.   Disposition:  Follow up with Dr. Bronson Ing in 6 months.   Signed, Erma Heritage, PA-C  06/04/2019 4:51 PM    [facility]

## 2019-06-09 ENCOUNTER — Telehealth: Payer: Self-pay | Admitting: Orthopaedic Surgery

## 2019-06-09 MED ORDER — HYDROCODONE-ACETAMINOPHEN 7.5-325 MG PO TABS: 1.0000 | ORAL_TABLET | Freq: Four times a day (QID) | ORAL | 0 refills | Status: DC | PRN

## 2019-06-09 NOTE — Telephone Encounter (Signed)
Hydrocodone-Acetaminophen 7.5/325 mg  Qty 60 Tablets   PATIENT USES [place] [facility]

## 2019-06-21 DIAGNOSIS — J449 Chronic obstructive pulmonary disease, unspecified: Secondary | ICD-10-CM | POA: Diagnosis not present

## 2019-06-29 DIAGNOSIS — I10 Essential (primary) hypertension: Secondary | ICD-10-CM | POA: Diagnosis not present

## 2019-06-29 DIAGNOSIS — E782 Mixed hyperlipidemia: Secondary | ICD-10-CM | POA: Diagnosis not present

## 2019-06-29 DIAGNOSIS — Z7901 Long term (current) use of anticoagulants: Secondary | ICD-10-CM | POA: Diagnosis not present

## 2019-07-05 DIAGNOSIS — H43393 Other vitreous opacities, bilateral: Secondary | ICD-10-CM | POA: Diagnosis not present

## 2019-07-05 DIAGNOSIS — H40023 Open angle with borderline findings, high risk, bilateral: Secondary | ICD-10-CM | POA: Diagnosis not present

## 2019-07-07 ENCOUNTER — Telehealth: Payer: Self-pay | Admitting: Orthopaedic Surgery

## 2019-07-07 NOTE — Telephone Encounter (Signed)
Patient called for refill: HYDROcodone-acetaminophen (NORCO) 7.5-325 MG tablet 60 tablet   - [place]

## 2019-07-08 MED ORDER — HYDROCODONE-ACETAMINOPHEN 7.5-325 MG PO TABS: 1.0000 | ORAL_TABLET | Freq: Four times a day (QID) | ORAL | 0 refills | Status: DC | PRN

## 2019-07-21 DIAGNOSIS — J449 Chronic obstructive pulmonary disease, unspecified: Secondary | ICD-10-CM | POA: Diagnosis not present

## 2019-07-29 DIAGNOSIS — H40053 Ocular hypertension, bilateral: Secondary | ICD-10-CM | POA: Diagnosis not present

## 2019-07-29 DIAGNOSIS — H35033 Hypertensive retinopathy, bilateral: Secondary | ICD-10-CM | POA: Diagnosis not present

## 2019-07-29 DIAGNOSIS — Z7901 Long term (current) use of anticoagulants: Secondary | ICD-10-CM | POA: Diagnosis not present

## 2019-07-30 DIAGNOSIS — E7800 Pure hypercholesterolemia, unspecified: Secondary | ICD-10-CM | POA: Diagnosis not present

## 2019-07-30 DIAGNOSIS — J441 Chronic obstructive pulmonary disease with (acute) exacerbation: Secondary | ICD-10-CM | POA: Diagnosis not present

## 2019-07-30 DIAGNOSIS — I10 Essential (primary) hypertension: Secondary | ICD-10-CM | POA: Diagnosis not present

## 2019-08-02 ENCOUNTER — Other Ambulatory Visit: Payer: Self-pay | Admitting: Physician Assistant

## 2019-08-02 ENCOUNTER — Other Ambulatory Visit (HOSPITAL_COMMUNITY): Payer: Self-pay | Admitting: Physician Assistant

## 2019-08-02 DIAGNOSIS — Z7901 Long term (current) use of anticoagulants: Secondary | ICD-10-CM | POA: Diagnosis not present

## 2019-08-02 DIAGNOSIS — I10 Essential (primary) hypertension: Secondary | ICD-10-CM

## 2019-08-02 DIAGNOSIS — Z86718 Personal history of other venous thrombosis and embolism: Secondary | ICD-10-CM | POA: Diagnosis not present

## 2019-08-06 ENCOUNTER — Ambulatory Visit (HOSPITAL_COMMUNITY): Payer: Medicare Other

## 2019-08-06 ENCOUNTER — Encounter (HOSPITAL_COMMUNITY): Payer: Self-pay

## 2019-08-10 ENCOUNTER — Encounter: Payer: Self-pay | Admitting: Orthopaedic Surgery

## 2019-08-10 ENCOUNTER — Ambulatory Visit (INDEPENDENT_AMBULATORY_CARE_PROVIDER_SITE_OTHER): Payer: Medicare Other | Admitting: Orthopaedic Surgery

## 2019-08-10 ENCOUNTER — Ambulatory Visit: Payer: Medicare Other

## 2019-08-10 ENCOUNTER — Other Ambulatory Visit: Payer: Self-pay

## 2019-08-10 VITALS — Temp 97.2°F | Ht 66.0 in | Wt 246.0 lb

## 2019-08-10 DIAGNOSIS — F17210 Nicotine dependence, cigarettes, uncomplicated: Secondary | ICD-10-CM

## 2019-08-10 DIAGNOSIS — Z7901 Long term (current) use of anticoagulants: Secondary | ICD-10-CM

## 2019-08-10 DIAGNOSIS — M25561 Pain in right knee: Secondary | ICD-10-CM

## 2019-08-10 DIAGNOSIS — M25562 Pain in left knee: Secondary | ICD-10-CM

## 2019-08-10 DIAGNOSIS — G8929 Other chronic pain: Secondary | ICD-10-CM | POA: Diagnosis not present

## 2019-08-10 MED ORDER — HYDROCODONE-ACETAMINOPHEN 7.5-325 MG PO TABS: 1.0000 | ORAL_TABLET | Freq: Four times a day (QID) | ORAL | 0 refills | Status: DC | PRN

## 2019-08-10 NOTE — Patient Instructions (Signed)
Steps to Quit Smoking Smoking tobacco is the leading cause of preventable death. It can affect almost every organ in the body. Smoking puts you and people around you at risk for many serious, long-lasting (chronic) diseases. Quitting smoking can be hard, but it is one of the best things that you can do for your health. It is never too late to quit. How do I get ready to quit? When you decide to quit smoking, make a plan to help you succeed. Before you quit:  Pick a date to quit. Set a date within the next 2 weeks to give you time to prepare.  Write down the reasons why you are quitting. Keep this list in places where you will see it often.  Tell your family, friends, and co-workers that you are quitting. Their support is important.  Talk with your doctor about the choices that may help you quit.  Find out if your health insurance will pay for these treatments.  Know the people, places, things, and activities that make you want to smoke (triggers). Avoid them. What first steps can I take to quit smoking?  Throw away all cigarettes at home, at work, and in your car.  Throw away the things that you use when you smoke, such as ashtrays and lighters.  Clean your car. Make sure to empty the ashtray.  Clean your home, including curtains and carpets. What can I do to help me quit smoking? Talk with your doctor about taking medicines and seeing a counselor at the same time. You are more likely to succeed when you do both.  If you are pregnant or breastfeeding, talk with your doctor about counseling or other ways to quit smoking. Do not take medicine to help you quit smoking unless your doctor tells you to do so. To quit smoking: Quit right away  Quit smoking totally, instead of slowly cutting back on how much you smoke over a period of time.  Go to counseling. You are more likely to quit if you go to counseling sessions regularly. Take medicine You may take medicines to help you quit. Some  medicines need a prescription, and some you can buy over-the-counter. Some medicines may contain a drug called nicotine to replace the nicotine in cigarettes. Medicines may:  Help you to stop having the desire to smoke (cravings).  Help to stop the problems that come when you stop smoking (withdrawal symptoms). Your doctor may ask you to use:  Nicotine patches, gum, or lozenges.  Nicotine inhalers or sprays.  Non-nicotine medicine that is taken by mouth. Find resources Find resources and other ways to help you quit smoking and remain smoke-free after you quit. These resources are most helpful when you use them often. They include:  Online chats with a counselor.  Phone quitlines.  Printed self-help materials.  Support groups or group counseling.  Text messaging programs.  Mobile phone apps. Use apps on your mobile phone or tablet that can help you stick to your quit plan. There are many free apps for mobile phones and tablets as well as websites. Examples include Quit Guide from the CDC and smokefree.gov  What things can I do to make it easier to quit?   Talk to your family and friends. Ask them to support and encourage you.  Call a phone quitline (1-800-QUIT-NOW), reach out to support groups, or work with a counselor.  Ask people who smoke to not smoke around you.  Avoid places that make you want to smoke,   such as: ? Bars. ? Parties. ? Smoke-break areas at work.  Spend time with people who do not smoke.  Lower the stress in your life. Stress can make you want to smoke. Try these things to help your stress: ? Getting regular exercise. ? Doing deep-breathing exercises. ? Doing yoga. ? Meditating. ? Doing a body scan. To do this, close your eyes, focus on one area of your body at a time from head to toe. Notice which parts of your body are tense. Try to relax the muscles in those areas. How will I feel when I quit smoking? Day 1 to 3 weeks Within the first 24 hours,  you may start to have some problems that come from quitting tobacco. These problems are very bad 2-3 days after you quit, but they do not often last for more than 2-3 weeks. You may get these symptoms:  Mood swings.  Feeling restless, nervous, angry, or annoyed.  Trouble concentrating.  Dizziness.  Strong desire for high-sugar foods and nicotine.  Weight gain.  Trouble pooping (constipation).  Feeling like you may vomit (nausea).  Coughing or a sore throat.  Changes in how the medicines that you take for other issues work in your body.  Depression.  Trouble sleeping (insomnia). Week 3 and afterward After the first 2-3 weeks of quitting, you may start to notice more positive results, such as:  Better sense of smell and taste.  Less coughing and sore throat.  Slower heart rate.  Lower blood pressure.  Clearer skin.  Better breathing.  Fewer sick days. Quitting smoking can be hard. Do not give up if you fail the first time. Some people need to try a few times before they succeed. Do your best to stick to your quit plan, and talk with your doctor if you have any questions or concerns. Summary  Smoking tobacco is the leading cause of preventable death. Quitting smoking can be hard, but it is one of the best things that you can do for your health.  When you decide to quit smoking, make a plan to help you succeed.  Quit smoking right away, not slowly over a period of time.  When you start quitting, seek help from your doctor, family, or friends. This information is not intended to replace advice given to you by your health care provider. Make sure you discuss any questions you have with your health care provider. Document Released: 10/12/2009 Document Revised: 03/05/2019 Document Reviewed: 03/06/2019 Elsevier Patient Education  2020 Elsevier Inc.  

## 2019-08-10 NOTE — Progress Notes (Signed)
Patient [id number] Tracy [name], female DOB:1961/01/13, 58 y.o. [id number]  Chief Complaint  Patient presents with  . Knee Pain    bilateral   . Medication Refill    hydrocodone    HPI  Tracy [name] is a 58 y.o. female who has long history of right knee pain has developed left knee pain now. She has some swelling, popping and it has given way once. She uses a cane now.  She has no trauma, no redness.  She has been taking her medicine.  She does not tolerate injections as they raise her blood sugar so high.   Body mass index is 39.71 kg/m.  ROS  Review of Systems  Constitutional: Positive for activity change.       Patient has Diabetes Mellitus. Patient has hypertension. Patient has COPD or shortness of breath. Patient has BMI > 35. Patient has current smoking history.  HENT: Negative for congestion.   Respiratory: Positive for cough and shortness of breath.   Cardiovascular: Negative for chest pain.  Endocrine: Positive for cold intolerance.  Musculoskeletal: Positive for arthralgias, gait problem, joint swelling and myalgias.  Allergic/Immunologic: Positive for environmental allergies.  Neurological: Negative for numbness.  All other systems reviewed and are negative.   All other systems reviewed and are negative.  The following is a summary of the past history medically, past history surgically, known current medicines, social history and family history.  This information is gathered electronically by the computer from prior information and documentation.  I review this each visit and have found including this information at this point in the chart is beneficial and informative.    Past Medical History:  Diagnosis Date  . Acute ischemic stroke ([place]) 07/03/2012  . Crack cocaine use   . Diabetes ([place])   . Diabetes mellitus ([place])   . DVT (deep venous thrombosis) ([place])   . Hemiparesis, acute ([place]) 07/04/2012  . Hypertension   . LVH (left ventricular hypertrophy)  07/04/2012   Ejection fraction 60%.  . Myocardial infarct, old   . Thrombocytopenia ([place]) 07/03/2012  . Tobacco abuse     Past Surgical History:  Procedure Laterality Date  . ABDOMINAL HYSTERECTOMY    . HERNIA REPAIR      Family History  Problem Relation Age of Onset  . Heart disease Mother   . Diabetes Mother   . Stroke Mother   . Hypertension Mother   . Hypertension Father   . Heart disease Father   . Diabetes Brother     Social History Social History   Tobacco Use  . Smoking status: Current Every Day Smoker    Packs/day: 1.00    Types: Cigarettes  . Smokeless tobacco: Never Used  Substance Use Topics  . Alcohol use: No  . Drug use: No    Comment: last used 2 years ago    No Known Allergies  Current Outpatient Medications  Medication Sig Dispense Refill  . albuterol (PROVENTIL HFA;VENTOLIN HFA) 108 (90 BASE) MCG/ACT inhaler Inhale 2 puffs into the lungs every 4 (four) hours as needed for wheezing or shortness of breath. 1 Inhaler 0  . atorvastatin (LIPITOR) 10 MG tablet     . budesonide-formoterol (SYMBICORT) 160-4.5 MCG/ACT inhaler Inhale 2 puffs into the lungs 2 (two) times daily.    . furosemide (LASIX) 20 MG tablet Take 20 mg by mouth.    . gabapentin (NEURONTIN) 300 MG capsule Take 300 mg by mouth 3 (three) times daily.    . hydrALAZINE (APRESOLINE) 25 MG tablet  Take 1 tablet (25 mg total) by mouth every 8 (eight) hours. (Patient taking differently: Take 25 mg by mouth daily. ) 90 tablet 1  . HYDROcodone-acetaminophen (NORCO) 7.5-325 MG tablet Take 1 tablet by mouth every 6 (six) hours as needed for moderate pain (Must last 30 days). 60 tablet 0  . JARDIANCE 25 MG TABS tablet Take 25 mg by mouth daily.     [name] lisinopril-hydrochlorothiazide (PRINZIDE,ZESTORETIC) 20-12.5 MG per tablet Take 2 tablets by mouth daily.    . metFORMIN (GLUMETZA) 1000 MG (MOD) 24 hr tablet Take 1 tablet by mouth 2 (two) times a day.    . metoprolol succinate (TOPROL-XL) 25 MG 24 hr  tablet Take 25 mg by mouth daily.     [name] SPIRIVA RESPIMAT 2.5 MCG/ACT AERS Inhale 1 puff into the lungs daily.    [name] warfarin (COUMADIN) 2.5 MG tablet Take 2.5 mg by mouth daily. as directed  0  . metFORMIN (GLUCOPHAGE-XR) 500 MG 24 hr tablet Take 1,000 mg by mouth 2 (two) times a day.      No current facility-administered medications for this visit.      Physical Exam  Temperature (!) 97.2 F (36.2 C), height 5\' 6"  (1.676 m), weight 246 lb (111.6 kg).  Constitutional: overall normal hygiene, normal nutrition, well developed, normal grooming, normal body habitus. Assistive device:cane  Musculoskeletal: gait and station Limp left, muscle tone and strength are normal, no tremors or atrophy is present.  .  Neurological: coordination overall normal.  Deep tendon reflex/nerve stretch intact.  Sensation normal.  Cranial nerves II-XII intact.   Skin:   Normal overall no scars, lesions, ulcers or rashes. No psoriasis.  Psychiatric: Alert and oriented x 3.  Recent memory intact, remote memory unclear.  Normal mood and affect. Well groomed.  Good eye contact.  Cardiovascular: overall no swelling, no varicosities, no edema bilaterally, normal temperatures of the legs and arms, no clubbing, cyanosis and good capillary refill.  Lymphatic: palpation is normal.  The bilateral lower extremity is examined:  Inspection:  Thigh:  Non-tender and no defects  Knee has swelling 1+ effusion.                        Joint tenderness is present                        Patient is tender over the medial joint line  Lower Leg:  Has normal appearance and no tenderness or defects  Ankle:  Non-tender and no defects  Foot:  Non-tender and no defects Range of Motion:  Knee:  Range of motion is: 0-100 left, 0-105 right, left is more tender.                        Crepitus is  present  Ankle:  Range of motion is normal. Strength and Tone:  The bilateral lower extremity has normal strength and  tone. Stability:  Knee:  The knee is stable.  Ankle:  The ankle is stable.   All other systems reviewed and are negative   The patient has been educated about the nature of the problem(s) and counseled on treatment options.  The patient appeared to understand what I have discussed and is in agreement with it.  Encounter Diagnoses  Name Primary?  . Chronic pain of left knee Yes  . Chronic pain of right knee   . Cigarette nicotine dependence without complication   .  Current use of long term anticoagulation     PLAN Call if any problems.  Precautions discussed.  Continue current medications.   Return to clinic 1 month   X-rays were done of the left knee, reported separately.  I have reviewed the [place] web site prior to prescribing narcotic medicine for this patient.   Electronically Signed Sanjuana Kava, MD 8/11/202010:03 AM

## 2019-08-13 ENCOUNTER — Other Ambulatory Visit: Payer: Self-pay

## 2019-08-13 ENCOUNTER — Emergency Department (HOSPITAL_COMMUNITY): Payer: Medicare Other

## 2019-08-13 ENCOUNTER — Encounter (HOSPITAL_COMMUNITY): Payer: Self-pay | Admitting: Emergency Medicine

## 2019-08-13 ENCOUNTER — Emergency Department (HOSPITAL_COMMUNITY)
Admission: EM | Admit: 2019-08-13 | Discharge: 2019-08-14 | Disposition: A | Discharge status: Home / Self Care | Payer: Medicare Other | Attending: Emergency Medicine | Admitting: Emergency Medicine

## 2019-08-13 DIAGNOSIS — M79671 Pain in right foot: Secondary | ICD-10-CM | POA: Diagnosis not present

## 2019-08-13 DIAGNOSIS — F17210 Nicotine dependence, cigarettes, uncomplicated: Secondary | ICD-10-CM | POA: Insufficient documentation

## 2019-08-13 DIAGNOSIS — Y92018 Other place in single-family (private) house as the place of occurrence of the external cause: Secondary | ICD-10-CM | POA: Insufficient documentation

## 2019-08-13 DIAGNOSIS — W07XXXA Fall from chair, initial encounter: Secondary | ICD-10-CM | POA: Diagnosis not present

## 2019-08-13 DIAGNOSIS — Z79899 Other long term (current) drug therapy: Secondary | ICD-10-CM | POA: Diagnosis not present

## 2019-08-13 DIAGNOSIS — I10 Essential (primary) hypertension: Secondary | ICD-10-CM | POA: Insufficient documentation

## 2019-08-13 DIAGNOSIS — Z7901 Long term (current) use of anticoagulants: Secondary | ICD-10-CM | POA: Diagnosis not present

## 2019-08-13 DIAGNOSIS — I82409 Acute embolism and thrombosis of unspecified deep veins of unspecified lower extremity: Secondary | ICD-10-CM | POA: Diagnosis not present

## 2019-08-13 DIAGNOSIS — Z7984 Long term (current) use of oral hypoglycemic drugs: Secondary | ICD-10-CM | POA: Diagnosis not present

## 2019-08-13 DIAGNOSIS — Z8673 Personal history of transient ischemic attack (TIA), and cerebral infarction without residual deficits: Secondary | ICD-10-CM | POA: Insufficient documentation

## 2019-08-13 DIAGNOSIS — Y999 Unspecified external cause status: Secondary | ICD-10-CM | POA: Insufficient documentation

## 2019-08-13 DIAGNOSIS — Y9384 Activity, sleeping: Secondary | ICD-10-CM | POA: Insufficient documentation

## 2019-08-13 DIAGNOSIS — E119 Type 2 diabetes mellitus without complications: Secondary | ICD-10-CM | POA: Diagnosis not present

## 2019-08-13 DIAGNOSIS — S9031XA Contusion of right foot, initial encounter: Secondary | ICD-10-CM

## 2019-08-13 DIAGNOSIS — J449 Chronic obstructive pulmonary disease, unspecified: Secondary | ICD-10-CM | POA: Diagnosis not present

## 2019-08-13 DIAGNOSIS — S99921A Unspecified injury of right foot, initial encounter: Secondary | ICD-10-CM | POA: Diagnosis not present

## 2019-08-13 NOTE — ED Triage Notes (Signed)
Patient fell from recliner while sleeping.  Patient states she fell onto right side hurting right foot.  Patient denies hitting head.  Patient was able to get up by herself.  Patient fall happened around 0600 this morning and felt like the pain in her right leg and foot increased since then.

## 2019-08-14 NOTE — Discharge Instructions (Signed)
Elevate our foot when possible.  Wear the post op shoe for support.  Follow-up with Dr. Luna Glasgow next week for recheck if needed.

## 2019-08-15 NOTE — ED Provider Notes (Signed)
[facility] EMERGENCY DEPARTMENT Provider Note   CSN: [id number] Arrival date & time: 08/13/19  [id number]     History   Chief Complaint Chief Complaint  Patient presents with  . Fall    HPI Tracy [name] is a 58 y.o. female.     HPI   Tracy [name] is a 58 y.o. female who presents to the Emergency Department complaining of pain and swelling of her right great toe and top of her foot.  Symptoms have been present for greater than 12 hours.  States she was napping while sitting on the edge of a recliner and slipped off  the recliner and fell landing on her right foot.  States her toes "bent backward"  She describes having a gradually increasing pain to the distal right foot and all her toes.  Pain worse with weight bearing, improves while at rest.  She also noticed a small amount of blood around the right second toenail.  She denies head injury, LOC, neck or back pain, dizziness or numbness of the extremities.  She has not tried any therapies prior to arrival   Past Medical History:  Diagnosis Date  . Acute ischemic stroke ([place]) 07/03/2012  . Crack cocaine use   . Diabetes ([place])   . Diabetes mellitus ([place])   . DVT (deep venous thrombosis) ([place])   . Hemiparesis, acute ([place]) 07/04/2012  . Hypertension   . LVH (left ventricular hypertrophy) 07/04/2012   Ejection fraction 60%.  . Myocardial infarct, old   . Thrombocytopenia ([place]) 07/03/2012  . Tobacco abuse     Patient Active Problem List   Diagnosis Date Noted  . Right bundle branch block 09/05/2017  . Long term current use of anticoagulant therapy 03/06/2017  . Body mass index (BMI) of 40.0-44.9 in adult ([place]) 02/20/2017  . Personal history of venous thrombosis and embolism 02/20/2017  . Umbilical hernia [id number]  . Phlebitis and thrombophlebitis of lower extremities 01/13/2017  . Left knee pain 03/05/2016  . Influenza with respiratory manifestations 03/22/2015  . COPD exacerbation ([place]) 03/21/2015  . CAP  (community acquired pneumonia) 07/04/2013  . Sepsis ([place]) 07/04/2013  . Acute renal failure ([place]) 07/04/2013  . Left thyroid nodule 07/04/2013  . Essential hypertension 07/04/2013  . Diabetes ([place]) 07/04/2013  . Acute respiratory failure ([place]) 07/04/2013  . Hemiparesis, acute ([place]) 07/04/2012  . LVH (left ventricular hypertrophy) 07/04/2012  . Acute ischemic stroke ([place]) 07/03/2012  . Hypertensive urgency 07/03/2012  . Tobacco abuse 07/03/2012  . Noncompliance 07/03/2012  . Thrombocytopenia ([place]) 07/03/2012  . Crack cocaine use 07/03/2012    Past Surgical History:  Procedure Laterality Date  . ABDOMINAL HYSTERECTOMY    . HERNIA REPAIR       OB History    Gravida  2   Para  2   Term  2   Preterm      AB      Living        SAB      TAB      Ectopic      Multiple      Live Births               Home Medications    Prior to Admission medications   Medication Sig Start Date End Date Taking? Authorizing Provider  albuterol (PROVENTIL HFA;VENTOLIN HFA) 108 (90 BASE) MCG/ACT inhaler Inhale 2 puffs into the lungs every 4 (four) hours as needed for wheezing or shortness of breath. 07/09/13   Sarajane Jews,  Melene Plan, MD  atorvastatin (LIPITOR) 10 MG tablet  06/06/19   [provider]  budesonide-formoterol (SYMBICORT) 160-4.5 MCG/ACT inhaler Inhale 2 puffs into the lungs 2 (two) times daily.    [provider]  furosemide (LASIX) 20 MG tablet Take 20 mg by mouth.    [provider]  gabapentin (NEURONTIN) 300 MG capsule Take 300 mg by mouth 3 (three) times daily.    [provider]  hydrALAZINE (APRESOLINE) 25 MG tablet Take 1 tablet (25 mg total) by mouth every 8 (eight) hours. Patient taking differently: Take 25 mg by mouth daily.  03/25/15   Kathie Dike, MD  HYDROcodone-acetaminophen (NORCO) 7.5-325 MG tablet Take 1 tablet by mouth every 6 (six) hours as needed for moderate pain (Must last 30 days). 08/10/19   Sanjuana Kava, MD   JARDIANCE 25 MG TABS tablet Take 25 mg by mouth daily.  01/02/18   [provider]  lisinopril-hydrochlorothiazide (PRINZIDE,ZESTORETIC) 20-12.5 MG per tablet Take 2 tablets by mouth daily.    [provider]  metFORMIN (GLUCOPHAGE-XR) 500 MG 24 hr tablet Take 1,000 mg by mouth 2 (two) times a day.     [provider]  metFORMIN (GLUMETZA) 1000 MG (MOD) 24 hr tablet Take 1 tablet by mouth 2 (two) times a day. 03/08/19   [provider]  metoprolol succinate (TOPROL-XL) 25 MG 24 hr tablet Take 25 mg by mouth daily.  12/05/17   [provider]  SPIRIVA RESPIMAT 2.5 MCG/ACT AERS Inhale 1 puff into the lungs daily. 05/12/19   [provider]  warfarin (COUMADIN) 2.5 MG tablet Take 2.5 mg by mouth daily. as directed 05/26/18   [provider]    Family History Family History  Problem Relation Age of Onset  . Heart disease Mother   . Diabetes Mother   . Stroke Mother   . Hypertension Mother   . Hypertension Father   . Heart disease Father   . Diabetes Brother     Social History Social History   Tobacco Use  . Smoking status: Current Every Day Smoker    Packs/day: 1.00    Types: Cigarettes  . Smokeless tobacco: Never Used  Substance Use Topics  . Alcohol use: No  . Drug use: No    Comment: last used 2 years ago     Allergies   Patient has no known allergies.   Review of Systems Review of Systems  Constitutional: Negative for chills and fever.  Eyes: Negative for visual disturbance.  Respiratory: Negative for shortness of breath.   Cardiovascular: Negative for chest pain.  Musculoskeletal: Positive for arthralgias (right foot pain and swelling). Negative for back pain and neck pain.       Bleeding around right second toenail  Skin: Negative for color change and rash.  Neurological: Negative for dizziness, syncope, weakness, numbness and headaches.     Physical Exam Updated Vital Signs BP (!) 143/81 (BP Location:  Right Arm)   Pulse 87   Temp 98.1 F (36.7 C) (Oral)   Resp 15   Ht 5\' 6"  (1.676 m)   Wt 112.5 kg   SpO2 98%   BMI 40.03 kg/m   Physical Exam Vitals signs and nursing note reviewed.  Constitutional:      Appearance: Normal appearance. She is not ill-appearing.  HENT:     Head: Atraumatic.  Eyes:     Extraocular Movements: Extraocular movements intact.     Pupils: Pupils are equal, round, and reactive to  light.  Neck:     Musculoskeletal: Full passive range of motion without pain and normal range of motion. No muscular tenderness.     Trachea: Phonation normal.  Cardiovascular:     Rate and Rhythm: Normal rate and regular rhythm.     Pulses: Normal pulses.     Comments: DP and PT pulses are strong and palpable bilaterally Pulmonary:     Effort: Pulmonary effort is normal.     Breath sounds: Normal breath sounds.  Chest:     Chest wall: No tenderness.  Musculoskeletal: Normal range of motion.        General: Tenderness and signs of injury present. No swelling or deformity.     Comments: Diffuse ttp of the distal right foot and all toes.  No appreciable edema or bony deformities.  No wounds of the foot or toes seen on exam.  No subungual hematoma or disruption of the second toenail.  No dries blood seen.   Right ankle and lower leg are non-tender.  Compartments are soft.   Skin:    General: Skin is warm.     Capillary Refill: Capillary refill takes less than 2 seconds.     Findings: No bruising, erythema or rash.  Neurological:     General: No focal deficit present.     Sensory: No sensory deficit.     Motor: No weakness.      ED Treatments / Results  Labs (all labs ordered are listed, but only abnormal results are displayed) Labs Reviewed - No data to display  EKG None  Radiology Dg Foot Complete Right  Result Date: 08/14/2019 CLINICAL DATA:  Patient fall, pain EXAM: RIGHT FOOT COMPLETE - 3+ VIEW COMPARISON:  None. FINDINGS: No fracture or dislocation. There is  diffuse osteopenia. Dorsal soft tissue swelling. IMPRESSION: No acute osseous findings. Electronically Signed   By: Prudencio Pair M.D.   On: 08/14/2019 00:06    Procedures Procedures (including critical care time)  Medications Ordered in ED Medications - No data to display   Initial Impression / Assessment and Plan / ED Course  I have reviewed the triage vital signs and the nursing notes.  Pertinent labs & imaging results that were available during my care of the patient were reviewed by me and considered in my medical decision making (see chart for details).       Pt with likely contusion of the right foot due to fall.  NV intact.  No open wounds.  She described bleeding of the right second toe, but I do not appreciate any open wounds or disruption of the toenail.  No subungual hematoma.  Pt has been walking around in the dept and requesting to leave.  Post op shoe applied.  She agrees to elevate and ice, orthopedic f/u in one week if needed   Final Clinical Impressions(s) / ED Diagnoses   Final diagnoses:  Contusion of right foot, initial encounter    ED Discharge Orders    None       Kem Parkinson, PA-C 08/15/19 [place], [place], DO 08/18/19 1345

## 2019-08-16 DIAGNOSIS — Z7901 Long term (current) use of anticoagulants: Secondary | ICD-10-CM | POA: Diagnosis not present

## 2019-08-18 DIAGNOSIS — E1151 Type 2 diabetes mellitus with diabetic peripheral angiopathy without gangrene: Secondary | ICD-10-CM | POA: Diagnosis not present

## 2019-08-18 DIAGNOSIS — L84 Corns and callosities: Secondary | ICD-10-CM | POA: Diagnosis not present

## 2019-08-18 DIAGNOSIS — I739 Peripheral vascular disease, unspecified: Secondary | ICD-10-CM | POA: Diagnosis not present

## 2019-08-18 DIAGNOSIS — L603 Nail dystrophy: Secondary | ICD-10-CM | POA: Diagnosis not present

## 2019-08-21 DIAGNOSIS — J449 Chronic obstructive pulmonary disease, unspecified: Secondary | ICD-10-CM | POA: Diagnosis not present

## 2019-09-07 ENCOUNTER — Ambulatory Visit (INDEPENDENT_AMBULATORY_CARE_PROVIDER_SITE_OTHER): Payer: Medicare Other | Admitting: Orthopaedic Surgery

## 2019-09-07 ENCOUNTER — Other Ambulatory Visit: Payer: Self-pay

## 2019-09-07 ENCOUNTER — Encounter: Payer: Self-pay | Admitting: Orthopaedic Surgery

## 2019-09-07 VITALS — BP 131/81 | HR 100 | Ht 66.0 in | Wt 248.0 lb

## 2019-09-07 DIAGNOSIS — F17210 Nicotine dependence, cigarettes, uncomplicated: Secondary | ICD-10-CM

## 2019-09-07 DIAGNOSIS — M25561 Pain in right knee: Secondary | ICD-10-CM

## 2019-09-07 DIAGNOSIS — M25562 Pain in left knee: Secondary | ICD-10-CM | POA: Diagnosis not present

## 2019-09-07 DIAGNOSIS — G8929 Other chronic pain: Secondary | ICD-10-CM

## 2019-09-07 MED ORDER — HYDROCODONE-ACETAMINOPHEN 7.5-325 MG PO TABS: 1.0000 | ORAL_TABLET | Freq: Four times a day (QID) | ORAL | 0 refills | Status: DC | PRN

## 2019-09-07 NOTE — Progress Notes (Signed)
Patient [id number] Tracy [name], female DOB:17-Nov-1961, 58 y.o. [id number]  Chief Complaint  Patient presents with  . Knee Pain    both    HPI  Tracy [name] is a 58 y.o. female who has bilateral knee pain, more on the right. She has no new trauma. She has swelling and popping.  She is out of her pain medicine.   Body mass index is 40.03 kg/m.  The patient meets the AMA guidelines for Morbid (severe) obesity with a BMI > 40.0 and I have recommended weight loss.   ROS  Review of Systems  Constitutional: Positive for activity change.       Patient has Diabetes Mellitus. Patient has hypertension. Patient has COPD or shortness of breath. Patient has BMI > 35. Patient has current smoking history.  HENT: Negative for congestion.   Respiratory: Positive for cough and shortness of breath.   Cardiovascular: Negative for chest pain.  Endocrine: Positive for cold intolerance.  Musculoskeletal: Positive for arthralgias, gait problem, joint swelling and myalgias.  Allergic/Immunologic: Positive for environmental allergies.  Neurological: Negative for numbness.  All other systems reviewed and are negative.   All other systems reviewed and are negative.  The following is a summary of the past history medically, past history surgically, known current medicines, social history and family history.  This information is gathered electronically by the computer from prior information and documentation.  I review this each visit and have found including this information at this point in the chart is beneficial and informative.    Past Medical History:  Diagnosis Date  . Acute ischemic stroke ([place]) 07/03/2012  . Crack cocaine use   . Diabetes ([place])   . Diabetes mellitus ([place])   . DVT (deep venous thrombosis) ([place])   . Hemiparesis, acute ([place]) 07/04/2012  . Hypertension   . LVH (left ventricular hypertrophy) 07/04/2012   Ejection fraction 60%.  . Myocardial infarct, old   .  Thrombocytopenia ([place]) 07/03/2012  . Tobacco abuse     Past Surgical History:  Procedure Laterality Date  . ABDOMINAL HYSTERECTOMY    . HERNIA REPAIR      Family History  Problem Relation Age of Onset  . Heart disease Mother   . Diabetes Mother   . Stroke Mother   . Hypertension Mother   . Hypertension Father   . Heart disease Father   . Diabetes Brother     Social History Social History   Tobacco Use  . Smoking status: Current Every Day Smoker    Packs/day: 1.00    Types: Cigarettes  . Smokeless tobacco: Never Used  Substance Use Topics  . Alcohol use: No  . Drug use: No    Comment: last used 2 years ago    No Known Allergies  Current Outpatient Medications  Medication Sig Dispense Refill  . albuterol (PROVENTIL HFA;VENTOLIN HFA) 108 (90 BASE) MCG/ACT inhaler Inhale 2 puffs into the lungs every 4 (four) hours as needed for wheezing or shortness of breath. 1 Inhaler 0  . atorvastatin (LIPITOR) 10 MG tablet     . budesonide-formoterol (SYMBICORT) 160-4.5 MCG/ACT inhaler Inhale 2 puffs into the lungs 2 (two) times daily.    . furosemide (LASIX) 20 MG tablet Take 20 mg by mouth.    . gabapentin (NEURONTIN) 300 MG capsule Take 300 mg by mouth 3 (three) times daily.    . hydrALAZINE (APRESOLINE) 25 MG tablet Take 1 tablet (25 mg total) by mouth every 8 (eight) hours. (Patient taking differently:  Take 25 mg by mouth daily. ) 90 tablet 1  . HYDROcodone-acetaminophen (NORCO) 7.5-325 MG tablet Take 1 tablet by mouth every 6 (six) hours as needed for moderate pain (Must last 30 days). 60 tablet 0  . JARDIANCE 25 MG TABS tablet Take 25 mg by mouth daily.     [name] lisinopril-hydrochlorothiazide (PRINZIDE,ZESTORETIC) 20-12.5 MG per tablet Take 2 tablets by mouth daily.    . metFORMIN (GLUCOPHAGE-XR) 500 MG 24 hr tablet Take 1,000 mg by mouth 2 (two) times a day.     . metFORMIN (GLUMETZA) 1000 MG (MOD) 24 hr tablet Take 1 tablet by mouth 2 (two) times a day.    . metoprolol succinate  (TOPROL-XL) 25 MG 24 hr tablet Take 25 mg by mouth daily.     [name] SPIRIVA RESPIMAT 2.5 MCG/ACT AERS Inhale 1 puff into the lungs daily.    [name] warfarin (COUMADIN) 2.5 MG tablet Take 2.5 mg by mouth daily. as directed  0   No current facility-administered medications for this visit.      Physical Exam  Blood pressure 131/81, pulse 100, height 5\' 6"  (1.676 m), weight 248 lb (112.5 kg).  Constitutional: overall normal hygiene, normal nutrition, well developed, normal grooming, normal body habitus. Assistive device:cane  Musculoskeletal: gait and station Limp right, muscle tone and strength are normal, no tremors or atrophy is present.  .  Neurological: coordination overall normal.  Deep tendon reflex/nerve stretch intact.  Sensation normal.  Cranial nerves II-XII intact.   Skin:   Normal overall no scars, lesions, ulcers or rashes. No psoriasis.  Psychiatric: Alert and oriented x 3.  Recent memory intact, remote memory unclear.  Normal mood and affect. Well groomed.  Good eye contact.  Cardiovascular: overall no swelling, no varicosities, no edema bilaterally, normal temperatures of the legs and arms, no clubbing, cyanosis and good capillary refill.  Lymphatic: palpation is normal.  Both knees are tender with effusion, ROM right is 0 to 105, left 0- to 110, limp right, knees are stable.  NV intact.  All other systems reviewed and are negative   The patient has been educated about the nature of the problem(s) and counseled on treatment options.  The patient appeared to understand what I have discussed and is in agreement with it.  Encounter Diagnoses  Name Primary?  . Chronic pain of left knee Yes  . Chronic pain of right knee   . Cigarette nicotine dependence without complication     PLAN Call if any problems.  Precautions discussed.  Continue current medications.   Return to clinic 6 weeks   I have reviewed the [place] web site  prior to prescribing narcotic medicine for this patient.   Electronically Signed Sanjuana Kava, MD 9/8/202011:12 AM

## 2019-09-16 DIAGNOSIS — Z7901 Long term (current) use of anticoagulants: Secondary | ICD-10-CM | POA: Diagnosis not present

## 2019-09-21 DIAGNOSIS — J449 Chronic obstructive pulmonary disease, unspecified: Secondary | ICD-10-CM | POA: Diagnosis not present

## 2019-09-29 DIAGNOSIS — E782 Mixed hyperlipidemia: Secondary | ICD-10-CM | POA: Diagnosis not present

## 2019-09-29 DIAGNOSIS — I10 Essential (primary) hypertension: Secondary | ICD-10-CM | POA: Diagnosis not present

## 2019-10-07 ENCOUNTER — Other Ambulatory Visit: Payer: Self-pay | Admitting: Radiology

## 2019-10-07 MED ORDER — HYDROCODONE-ACETAMINOPHEN 7.5-325 MG PO TABS: 1.0000 | ORAL_TABLET | Freq: Four times a day (QID) | ORAL | 0 refills | Status: DC | PRN

## 2019-10-07 NOTE — Telephone Encounter (Signed)
Patient called asked for refill hydrocodone.    [facility].

## 2019-10-14 DIAGNOSIS — Z86718 Personal history of other venous thrombosis and embolism: Secondary | ICD-10-CM | POA: Diagnosis not present

## 2019-10-14 DIAGNOSIS — E1165 Type 2 diabetes mellitus with hyperglycemia: Secondary | ICD-10-CM | POA: Diagnosis not present

## 2019-10-14 DIAGNOSIS — J449 Chronic obstructive pulmonary disease, unspecified: Secondary | ICD-10-CM | POA: Diagnosis not present

## 2019-10-14 DIAGNOSIS — I872 Venous insufficiency (chronic) (peripheral): Secondary | ICD-10-CM | POA: Diagnosis not present

## 2019-10-14 DIAGNOSIS — Z7901 Long term (current) use of anticoagulants: Secondary | ICD-10-CM | POA: Diagnosis not present

## 2019-10-14 DIAGNOSIS — R600 Localized edema: Secondary | ICD-10-CM | POA: Diagnosis not present

## 2019-10-19 ENCOUNTER — Encounter: Payer: Self-pay | Admitting: Orthopaedic Surgery

## 2019-10-19 ENCOUNTER — Ambulatory Visit (INDEPENDENT_AMBULATORY_CARE_PROVIDER_SITE_OTHER): Payer: Medicare Other | Admitting: Orthopaedic Surgery

## 2019-10-19 ENCOUNTER — Other Ambulatory Visit: Payer: Self-pay

## 2019-10-19 VITALS — BP 169/84 | HR 98 | Temp 97.7°F | Ht 66.0 in | Wt 247.1 lb

## 2019-10-19 DIAGNOSIS — G8929 Other chronic pain: Secondary | ICD-10-CM | POA: Diagnosis not present

## 2019-10-19 DIAGNOSIS — F17210 Nicotine dependence, cigarettes, uncomplicated: Secondary | ICD-10-CM | POA: Diagnosis not present

## 2019-10-19 DIAGNOSIS — M25561 Pain in right knee: Secondary | ICD-10-CM

## 2019-10-19 DIAGNOSIS — M25562 Pain in left knee: Secondary | ICD-10-CM | POA: Diagnosis not present

## 2019-10-19 NOTE — Patient Instructions (Signed)

## 2019-10-19 NOTE — Progress Notes (Signed)
Patient Tracy [name], female DOB:09-16-1961, 58 y.o. [id number]  Chief Complaint  Patient presents with  . Knee Pain    Bilateral/they hurt real bad    HPI  Tracy [name] is a 58 y.o. female who has bilateral knee pain. She has swelling and popping but no giving way. She has good and bad days. She is taking her medicine. She has no new trauma.   Body mass index is 39.89 kg/m.  ROS  Review of Systems  Constitutional: Positive for activity change.       Patient has Diabetes Mellitus. Patient has hypertension. Patient has COPD or shortness of breath. Patient has BMI > 35. Patient has current smoking history.  HENT: Negative for congestion.   Respiratory: Positive for cough and shortness of breath.   Cardiovascular: Negative for chest pain.  Endocrine: Positive for cold intolerance.  Musculoskeletal: Positive for arthralgias, gait problem, joint swelling and myalgias.  Allergic/Immunologic: Positive for environmental allergies.  Neurological: Negative for numbness.  All other systems reviewed and are negative.   All other systems reviewed and are negative.  The following is a summary of the past history medically, past history surgically, known current medicines, social history and family history.  This information is gathered electronically by the computer from prior information and documentation.  I review this each visit and have found including this information at this point in the chart is beneficial and informative.    Past Medical History:  Diagnosis Date  . Acute ischemic stroke ([place]) 07/03/2012  . Crack cocaine use   . Diabetes ([place])   . Diabetes mellitus ([place])   . DVT (deep venous thrombosis) ([place])   . Hemiparesis, acute ([place]) 07/04/2012  . Hypertension   . LVH (left ventricular hypertrophy) 07/04/2012   Ejection fraction 60%.  . Myocardial infarct, old   . Thrombocytopenia ([place]) 07/03/2012  . Tobacco abuse     Past Surgical History:   Procedure Laterality Date  . ABDOMINAL HYSTERECTOMY    . HERNIA REPAIR      Family History  Problem Relation Age of Onset  . Heart disease Mother   . Diabetes Mother   . Stroke Mother   . Hypertension Mother   . Hypertension Father   . Heart disease Father   . Diabetes Brother     Social History Social History   Tobacco Use  . Smoking status: Current Every Day Smoker    Packs/day: 1.00    Types: Cigarettes  . Smokeless tobacco: Never Used  Substance Use Topics  . Alcohol use: No  . Drug use: No    Comment: last used 2 years ago    No Known Allergies  Current Outpatient Medications  Medication Sig Dispense Refill  . albuterol (PROVENTIL HFA;VENTOLIN HFA) 108 (90 BASE) MCG/ACT inhaler Inhale 2 puffs into the lungs every 4 (four) hours as needed for wheezing or shortness of breath. 1 Inhaler 0  . atorvastatin (LIPITOR) 10 MG tablet     . budesonide-formoterol (SYMBICORT) 160-4.5 MCG/ACT inhaler Inhale 2 puffs into the lungs 2 (two) times daily.    . furosemide (LASIX) 20 MG tablet Take 20 mg by mouth.    . gabapentin (NEURONTIN) 300 MG capsule Take 300 mg by mouth 3 (three) times daily.    . hydrALAZINE (APRESOLINE) 25 MG tablet Take 1 tablet (25 mg total) by mouth every 8 (eight) hours. (Patient taking differently: Take 25 mg by mouth daily. ) 90 tablet 1  . HYDROcodone-acetaminophen (NORCO) 7.5-325 MG tablet  Take 1 tablet by mouth every 6 (six) hours as needed for moderate pain (Must last 30 days). 60 tablet 0  . JARDIANCE 25 MG TABS tablet Take 25 mg by mouth daily.     [name] lisinopril-hydrochlorothiazide (PRINZIDE,ZESTORETIC) 20-12.5 MG per tablet Take 2 tablets by mouth daily.    . metFORMIN (GLUCOPHAGE-XR) 500 MG 24 hr tablet Take 1,000 mg by mouth 2 (two) times a day.     . metFORMIN (GLUMETZA) 1000 MG (MOD) 24 hr tablet Take 1 tablet by mouth 2 (two) times a day.    . metoprolol succinate (TOPROL-XL) 25 MG 24 hr tablet Take 25 mg by mouth daily.     [name] SPIRIVA  RESPIMAT 2.5 MCG/ACT AERS Inhale 1 puff into the lungs daily.    [name] warfarin (COUMADIN) 2.5 MG tablet Take 2.5 mg by mouth daily. as directed  0   No current facility-administered medications for this visit.      Physical Exam  Blood pressure (!) 169/84, pulse 98, temperature 97.7 F (36.5 C), height 5\' 6"  (1.676 m), weight 247 lb 2 oz (112.1 kg).  Constitutional: overall normal hygiene, normal nutrition, well developed, normal grooming, normal body habitus. Assistive device:cane  Musculoskeletal: gait and station Limp left, muscle tone and strength are normal, no tremors or atrophy is present.  .  Neurological: coordination overall normal.  Deep tendon reflex/nerve stretch intact.  Sensation normal.  Cranial nerves II-XII intact.   Skin:   Normal overall no scars, lesions, ulcers or rashes. No psoriasis.  Psychiatric: Alert and oriented x 3.  Recent memory intact, remote memory unclear.  Normal mood and affect. Well groomed.  Good eye contact.  Cardiovascular: overall no swelling, no varicosities, no edema bilaterally, normal temperatures of the legs and arms, no clubbing, cyanosis and good capillary refill.  Lymphatic: palpation is normal.  Both knees are tender with effusion and crepitus. ROM of right is 0 to 105, left 0 to 95.  Limp left.  NV intact.  All other systems reviewed and are negative   The patient has been educated about the nature of the problem(s) and counseled on treatment options.  The patient appeared to understand what I have discussed and is in agreement with it.  Encounter Diagnoses  Name Primary?  . Chronic pain of left knee Yes  . Chronic pain of right knee   . Cigarette nicotine dependence without complication     PLAN Call if any problems.  Precautions discussed.  Continue current medications.   Return to clinic 3 months   Electronically Signed Sanjuana Kava, MD 10/20/202010:22 AM

## 2019-10-21 DIAGNOSIS — J449 Chronic obstructive pulmonary disease, unspecified: Secondary | ICD-10-CM | POA: Diagnosis not present

## 2019-10-21 DIAGNOSIS — R600 Localized edema: Secondary | ICD-10-CM | POA: Diagnosis not present

## 2019-10-21 DIAGNOSIS — I872 Venous insufficiency (chronic) (peripheral): Secondary | ICD-10-CM | POA: Diagnosis not present

## 2019-11-03 ENCOUNTER — Telehealth: Payer: Self-pay | Admitting: Orthopaedic Surgery

## 2019-11-03 MED ORDER — HYDROCODONE-ACETAMINOPHEN 7.5-325 MG PO TABS: 1.0000 | ORAL_TABLET | Freq: Four times a day (QID) | ORAL | 0 refills | Status: DC | PRN

## 2019-11-03 NOTE — Telephone Encounter (Signed)
Patient called and requested refill on Hydrocodone/Acetaminophen 7.5-325  Mgs.  Qty  60  Sig: Take 1 tablet by mouth every 6 (six) hours as needed for moderate pain (Must last 30 days).  Patient states she uses [facility]

## 2019-11-11 DIAGNOSIS — M7989 Other specified soft tissue disorders: Secondary | ICD-10-CM | POA: Diagnosis not present

## 2019-11-11 DIAGNOSIS — E119 Type 2 diabetes mellitus without complications: Secondary | ICD-10-CM | POA: Diagnosis not present

## 2019-11-11 DIAGNOSIS — I10 Essential (primary) hypertension: Secondary | ICD-10-CM | POA: Diagnosis not present

## 2019-11-11 DIAGNOSIS — I69951 Hemiplegia and hemiparesis following unspecified cerebrovascular disease affecting right dominant side: Secondary | ICD-10-CM | POA: Diagnosis not present

## 2019-11-11 DIAGNOSIS — Z79899 Other long term (current) drug therapy: Secondary | ICD-10-CM | POA: Diagnosis not present

## 2019-11-11 DIAGNOSIS — Z7901 Long term (current) use of anticoagulants: Secondary | ICD-10-CM | POA: Diagnosis not present

## 2019-11-11 DIAGNOSIS — I252 Old myocardial infarction: Secondary | ICD-10-CM | POA: Diagnosis not present

## 2019-11-11 DIAGNOSIS — Z7984 Long term (current) use of oral hypoglycemic drugs: Secondary | ICD-10-CM | POA: Diagnosis not present

## 2019-11-11 DIAGNOSIS — I890 Lymphedema, not elsewhere classified: Secondary | ICD-10-CM | POA: Diagnosis not present

## 2019-11-11 DIAGNOSIS — Z86718 Personal history of other venous thrombosis and embolism: Secondary | ICD-10-CM | POA: Diagnosis not present

## 2019-11-11 DIAGNOSIS — J449 Chronic obstructive pulmonary disease, unspecified: Secondary | ICD-10-CM | POA: Diagnosis not present

## 2019-11-15 ENCOUNTER — Other Ambulatory Visit (HOSPITAL_COMMUNITY): Payer: Self-pay | Admitting: Physician Assistant

## 2019-11-15 DIAGNOSIS — J441 Chronic obstructive pulmonary disease with (acute) exacerbation: Secondary | ICD-10-CM | POA: Diagnosis not present

## 2019-11-15 DIAGNOSIS — R600 Localized edema: Secondary | ICD-10-CM | POA: Diagnosis not present

## 2019-11-15 DIAGNOSIS — Z7901 Long term (current) use of anticoagulants: Secondary | ICD-10-CM | POA: Diagnosis not present

## 2019-11-15 DIAGNOSIS — Z1231 Encounter for screening mammogram for malignant neoplasm of breast: Secondary | ICD-10-CM

## 2019-11-16 DIAGNOSIS — I739 Peripheral vascular disease, unspecified: Secondary | ICD-10-CM | POA: Diagnosis not present

## 2019-11-16 DIAGNOSIS — L84 Corns and callosities: Secondary | ICD-10-CM | POA: Diagnosis not present

## 2019-11-16 DIAGNOSIS — E1151 Type 2 diabetes mellitus with diabetic peripheral angiopathy without gangrene: Secondary | ICD-10-CM | POA: Diagnosis not present

## 2019-11-16 DIAGNOSIS — L603 Nail dystrophy: Secondary | ICD-10-CM | POA: Diagnosis not present

## 2019-11-17 DIAGNOSIS — I890 Lymphedema, not elsewhere classified: Secondary | ICD-10-CM | POA: Diagnosis not present

## 2019-11-17 DIAGNOSIS — R600 Localized edema: Secondary | ICD-10-CM | POA: Diagnosis not present

## 2019-11-17 DIAGNOSIS — M79604 Pain in right leg: Secondary | ICD-10-CM | POA: Diagnosis not present

## 2019-11-17 DIAGNOSIS — M79605 Pain in left leg: Secondary | ICD-10-CM | POA: Diagnosis not present

## 2019-11-19 DIAGNOSIS — R2243 Localized swelling, mass and lump, lower limb, bilateral: Secondary | ICD-10-CM | POA: Diagnosis not present

## 2019-11-19 DIAGNOSIS — Z87891 Personal history of nicotine dependence: Secondary | ICD-10-CM | POA: Diagnosis not present

## 2019-11-19 DIAGNOSIS — M79605 Pain in left leg: Secondary | ICD-10-CM | POA: Diagnosis not present

## 2019-11-19 DIAGNOSIS — I890 Lymphedema, not elsewhere classified: Secondary | ICD-10-CM | POA: Diagnosis not present

## 2019-11-19 DIAGNOSIS — E119 Type 2 diabetes mellitus without complications: Secondary | ICD-10-CM | POA: Diagnosis not present

## 2019-11-19 DIAGNOSIS — Z7901 Long term (current) use of anticoagulants: Secondary | ICD-10-CM | POA: Diagnosis not present

## 2019-11-19 DIAGNOSIS — M79604 Pain in right leg: Secondary | ICD-10-CM | POA: Diagnosis not present

## 2019-11-21 DIAGNOSIS — J449 Chronic obstructive pulmonary disease, unspecified: Secondary | ICD-10-CM | POA: Diagnosis not present

## 2019-11-29 DIAGNOSIS — E782 Mixed hyperlipidemia: Secondary | ICD-10-CM | POA: Diagnosis not present

## 2019-11-29 DIAGNOSIS — I10 Essential (primary) hypertension: Secondary | ICD-10-CM | POA: Diagnosis not present

## 2019-11-30 ENCOUNTER — Telehealth: Payer: Self-pay | Admitting: Orthopaedic Surgery

## 2019-11-30 DIAGNOSIS — J449 Chronic obstructive pulmonary disease, unspecified: Secondary | ICD-10-CM | POA: Diagnosis not present

## 2019-11-30 DIAGNOSIS — R600 Localized edema: Secondary | ICD-10-CM | POA: Diagnosis not present

## 2019-11-30 DIAGNOSIS — R0600 Dyspnea, unspecified: Secondary | ICD-10-CM | POA: Diagnosis not present

## 2019-11-30 DIAGNOSIS — Z7901 Long term (current) use of anticoagulants: Secondary | ICD-10-CM | POA: Diagnosis not present

## 2019-11-30 DIAGNOSIS — I10 Essential (primary) hypertension: Secondary | ICD-10-CM | POA: Diagnosis not present

## 2019-11-30 NOTE — Telephone Encounter (Signed)
Patient requests refill: HYDROcodone-acetaminophen (NORCO) 7.5-325 MG tablet 60 tablet  [place]

## 2019-12-01 MED ORDER — HYDROCODONE-ACETAMINOPHEN 7.5-325 MG PO TABS: 1.0000 | ORAL_TABLET | Freq: Four times a day (QID) | ORAL | 0 refills | Status: DC | PRN

## 2019-12-09 DIAGNOSIS — Z8673 Personal history of transient ischemic attack (TIA), and cerebral infarction without residual deficits: Secondary | ICD-10-CM | POA: Diagnosis not present

## 2019-12-09 DIAGNOSIS — J449 Chronic obstructive pulmonary disease, unspecified: Secondary | ICD-10-CM | POA: Diagnosis not present

## 2019-12-09 DIAGNOSIS — I890 Lymphedema, not elsewhere classified: Secondary | ICD-10-CM | POA: Diagnosis not present

## 2019-12-09 DIAGNOSIS — I252 Old myocardial infarction: Secondary | ICD-10-CM | POA: Diagnosis not present

## 2019-12-09 DIAGNOSIS — Z7901 Long term (current) use of anticoagulants: Secondary | ICD-10-CM | POA: Diagnosis not present

## 2019-12-09 DIAGNOSIS — Z7984 Long term (current) use of oral hypoglycemic drugs: Secondary | ICD-10-CM | POA: Diagnosis not present

## 2019-12-09 DIAGNOSIS — E119 Type 2 diabetes mellitus without complications: Secondary | ICD-10-CM | POA: Diagnosis not present

## 2019-12-09 DIAGNOSIS — Z79899 Other long term (current) drug therapy: Secondary | ICD-10-CM | POA: Diagnosis not present

## 2019-12-09 DIAGNOSIS — Z86718 Personal history of other venous thrombosis and embolism: Secondary | ICD-10-CM | POA: Diagnosis not present

## 2019-12-09 DIAGNOSIS — I10 Essential (primary) hypertension: Secondary | ICD-10-CM | POA: Diagnosis not present

## 2019-12-13 ENCOUNTER — Ambulatory Visit: Payer: Medicare Other | Admitting: Cardiovascular Disease

## 2019-12-14 DIAGNOSIS — Z7901 Long term (current) use of anticoagulants: Secondary | ICD-10-CM | POA: Diagnosis not present

## 2019-12-21 DIAGNOSIS — J449 Chronic obstructive pulmonary disease, unspecified: Secondary | ICD-10-CM | POA: Diagnosis not present

## 2019-12-27 ENCOUNTER — Other Ambulatory Visit: Payer: Self-pay

## 2019-12-27 ENCOUNTER — Ambulatory Visit (HOSPITAL_COMMUNITY)
Admission: RE | Admit: 2019-12-27 | Discharge: 2019-12-27 | Disposition: A | Discharge status: Home / Self Care | Payer: Medicare Other | Source: Ambulatory Visit | Attending: Physician Assistant | Admitting: Physician Assistant

## 2019-12-27 DIAGNOSIS — Z1231 Encounter for screening mammogram for malignant neoplasm of breast: Secondary | ICD-10-CM | POA: Diagnosis not present

## 2019-12-28 DIAGNOSIS — Z7901 Long term (current) use of anticoagulants: Secondary | ICD-10-CM | POA: Diagnosis not present

## 2019-12-30 DIAGNOSIS — E782 Mixed hyperlipidemia: Secondary | ICD-10-CM | POA: Diagnosis not present

## 2019-12-30 DIAGNOSIS — I10 Essential (primary) hypertension: Secondary | ICD-10-CM | POA: Diagnosis not present

## 2020-01-03 ENCOUNTER — Telehealth: Payer: Self-pay

## 2020-01-03 MED ORDER — HYDROCODONE-ACETAMINOPHEN 7.5-325 MG PO TABS: 1.0000 | ORAL_TABLET | Freq: Four times a day (QID) | ORAL | 0 refills | Status: DC | PRN

## 2020-01-03 NOTE — Telephone Encounter (Signed)
Hydrocodone-Acetaminophen 7.5/325 mg  Qty  60 Tablets ° ° °PATIENT USES [place] °

## 2020-01-11 DIAGNOSIS — Z7901 Long term (current) use of anticoagulants: Secondary | ICD-10-CM | POA: Diagnosis not present

## 2020-01-11 DIAGNOSIS — I80291 Phlebitis and thrombophlebitis of other deep vessels of right lower extremity: Secondary | ICD-10-CM | POA: Diagnosis not present

## 2020-01-11 DIAGNOSIS — Z86718 Personal history of other venous thrombosis and embolism: Secondary | ICD-10-CM | POA: Diagnosis not present

## 2020-01-18 ENCOUNTER — Other Ambulatory Visit: Payer: Self-pay

## 2020-01-18 ENCOUNTER — Encounter: Payer: Self-pay | Admitting: Orthopaedic Surgery

## 2020-01-18 ENCOUNTER — Ambulatory Visit (INDEPENDENT_AMBULATORY_CARE_PROVIDER_SITE_OTHER): Payer: Medicare Other | Admitting: Orthopaedic Surgery

## 2020-01-18 VITALS — Temp 98.0°F | Ht 66.0 in | Wt 243.1 lb

## 2020-01-18 DIAGNOSIS — F17210 Nicotine dependence, cigarettes, uncomplicated: Secondary | ICD-10-CM | POA: Diagnosis not present

## 2020-01-18 DIAGNOSIS — M25562 Pain in left knee: Secondary | ICD-10-CM | POA: Diagnosis not present

## 2020-01-18 DIAGNOSIS — G8929 Other chronic pain: Secondary | ICD-10-CM | POA: Diagnosis not present

## 2020-01-18 DIAGNOSIS — M25561 Pain in right knee: Secondary | ICD-10-CM

## 2020-01-18 NOTE — Progress Notes (Signed)
Patient [id number] Tracy [name], female DOB:1961-05-25, 59 y.o. [id number]  Chief Complaint  Patient presents with  . Knee Pain    Bilateral/doing ok for today/hurting alittle    HPI  Tracy [name] is a 59 y.o. female who has chronic pain of the right and left knee, more on the right.  She has no new trauma. She has swelling and popping but no redness. The cold weather has made it worse. She is taking her medicine.   Body mass index is 39.24 kg/m.  ROS  Review of Systems  Constitutional: Positive for activity change.       Patient has Diabetes Mellitus. Patient has hypertension. Patient has COPD or shortness of breath. Patient has BMI > 35. Patient has current smoking history.  HENT: Negative for congestion.   Respiratory: Positive for cough and shortness of breath.   Cardiovascular: Negative for chest pain.  Endocrine: Positive for cold intolerance.  Musculoskeletal: Positive for arthralgias, gait problem, joint swelling and myalgias.  Allergic/Immunologic: Positive for environmental allergies.  Neurological: Negative for numbness.  All other systems reviewed and are negative.   All other systems reviewed and are negative.  The following is a summary of the past history medically, past history surgically, known current medicines, social history and family history.  This information is gathered electronically by the computer from prior information and documentation.  I review this each visit and have found including this information at this point in the chart is beneficial and informative.    Past Medical History:  Diagnosis Date  . Acute ischemic stroke ([place]) 07/03/2012  . Crack cocaine use   . Diabetes ([place])   . Diabetes mellitus ([place])   . DVT (deep venous thrombosis) ([place])   . Hemiparesis, acute ([place]) 07/04/2012  . Hypertension   . LVH (left ventricular hypertrophy) 07/04/2012   Ejection fraction 60%.  . Myocardial infarct, old   . Thrombocytopenia ([place])  07/03/2012  . Tobacco abuse     Past Surgical History:  Procedure Laterality Date  . ABDOMINAL HYSTERECTOMY    . HERNIA REPAIR      Family History  Problem Relation Age of Onset  . Heart disease Mother   . Diabetes Mother   . Stroke Mother   . Hypertension Mother   . Hypertension Father   . Heart disease Father   . Diabetes Brother     Social History Social History   Tobacco Use  . Smoking status: Current Every Day Smoker    Packs/day: 1.00    Types: Cigarettes  . Smokeless tobacco: Never Used  Substance Use Topics  . Alcohol use: No  . Drug use: No    Comment: last used 2 years ago    No Known Allergies  Current Outpatient Medications  Medication Sig Dispense Refill  . albuterol (PROVENTIL HFA;VENTOLIN HFA) 108 (90 BASE) MCG/ACT inhaler Inhale 2 puffs into the lungs every 4 (four) hours as needed for wheezing or shortness of breath. 1 Inhaler 0  . atorvastatin (LIPITOR) 10 MG tablet     . budesonide-formoterol (SYMBICORT) 160-4.5 MCG/ACT inhaler Inhale 2 puffs into the lungs 2 (two) times daily.    . furosemide (LASIX) 20 MG tablet Take 20 mg by mouth.    . gabapentin (NEURONTIN) 300 MG capsule Take 300 mg by mouth 3 (three) times daily.    . hydrALAZINE (APRESOLINE) 25 MG tablet Take 1 tablet (25 mg total) by mouth every 8 (eight) hours. (Patient taking differently: Take 25 mg by mouth daily. )  90 tablet 1  . HYDROcodone-acetaminophen (NORCO) 7.5-325 MG tablet Take 1 tablet by mouth every 6 (six) hours as needed for moderate pain (Must last 30 days). 60 tablet 0  . JARDIANCE 25 MG TABS tablet Take 25 mg by mouth daily.     [name] lisinopril-hydrochlorothiazide (PRINZIDE,ZESTORETIC) 20-12.5 MG per tablet Take 2 tablets by mouth daily.    . metFORMIN (GLUCOPHAGE-XR) 500 MG 24 hr tablet Take 1,000 mg by mouth 2 (two) times a day.     . metFORMIN (GLUMETZA) 1000 MG (MOD) 24 hr tablet Take 1 tablet by mouth 2 (two) times a day.    . metoprolol succinate (TOPROL-XL) 25 MG 24 hr  tablet Take 25 mg by mouth daily.     [name] SPIRIVA RESPIMAT 2.5 MCG/ACT AERS Inhale 1 puff into the lungs daily.    [name] warfarin (COUMADIN) 2.5 MG tablet Take 2.5 mg by mouth daily. as directed  0   No current facility-administered medications for this visit.     Physical Exam  Temperature 98 F (36.7 C), height 5\' 6"  (1.676 m), weight 243 lb 2 oz (110.3 kg).  Constitutional: overall normal hygiene, normal nutrition, well developed, normal grooming, normal body habitus. Assistive device:cane  Musculoskeletal: gait and station Limp right, muscle tone and strength are normal, no tremors or atrophy is present.  .  Neurological: coordination overall normal.  Deep tendon reflex/nerve stretch intact.  Sensation normal.  Cranial nerves II-XII intact.   Skin:   Normal overall no scars, lesions, ulcers or rashes. No psoriasis.  Psychiatric: Alert and oriented x 3.  Recent memory intact, remote memory unclear.  Normal mood and affect. Well groomed.  Good eye contact.  Cardiovascular: overall no swelling, no varicosities, no edema bilaterally, normal temperatures of the legs and arms, no clubbing, cyanosis and good capillary refill.  Lymphatic: palpation is normal.  All other systems reviewed and are negative   Both knees have effusion, tenderness, crepitus, the right is 0 to 95 and the left is 0 to 105.  She uses a cane.  Limp right.  The patient has been educated about the nature of the problem(s) and counseled on treatment options.  The patient appeared to understand what I have discussed and is in agreement with it.  Encounter Diagnoses  Name Primary?  . Chronic pain of right knee Yes  . Chronic pain of left knee   . Cigarette nicotine dependence without complication     PLAN Call if any problems.  Precautions discussed.  Continue current medications.   Return to clinic 3 months   Electronically Signed Sanjuana Kava, MD 1/19/20219:42 AM

## 2020-01-18 NOTE — Patient Instructions (Signed)

## 2020-01-21 DIAGNOSIS — J449 Chronic obstructive pulmonary disease, unspecified: Secondary | ICD-10-CM | POA: Diagnosis not present

## 2020-01-24 ENCOUNTER — Telehealth: Payer: Self-pay | Admitting: Orthopaedic Surgery

## 2020-01-24 NOTE — Telephone Encounter (Signed)
Patient called to ask whether Dr Tracy [name] would consider prescribing pain medication due to her other knee hurting, as discussed at recent office visit (01/18/20). Relayed that her current pain medication will also help this knee. Patient voiced understanding. Patient is aware of when next medication refill is due; will call to request.

## 2020-01-25 DIAGNOSIS — L03316 Cellulitis of umbilicus: Secondary | ICD-10-CM | POA: Diagnosis not present

## 2020-01-25 DIAGNOSIS — J9611 Chronic respiratory failure with hypoxia: Secondary | ICD-10-CM | POA: Diagnosis not present

## 2020-02-01 ENCOUNTER — Telehealth: Payer: Self-pay | Admitting: Orthopaedic Surgery

## 2020-02-01 MED ORDER — HYDROCODONE-ACETAMINOPHEN 7.5-325 MG PO TABS: 1.0000 | ORAL_TABLET | Freq: Four times a day (QID) | ORAL | 0 refills | Status: DC | PRN

## 2020-02-01 NOTE — Telephone Encounter (Signed)
Patient requests refill: HYDROcodone-acetaminophen (NORCO) 7.5-325 MG tablet 60 tablet   [place]

## 2020-02-08 DIAGNOSIS — I252 Old myocardial infarction: Secondary | ICD-10-CM | POA: Diagnosis not present

## 2020-02-08 DIAGNOSIS — Z5329 Procedure and treatment not carried out because of patient's decision for other reasons: Secondary | ICD-10-CM | POA: Diagnosis not present

## 2020-02-08 DIAGNOSIS — Z7901 Long term (current) use of anticoagulants: Secondary | ICD-10-CM | POA: Diagnosis not present

## 2020-02-08 DIAGNOSIS — R0602 Shortness of breath: Secondary | ICD-10-CM | POA: Diagnosis not present

## 2020-02-08 DIAGNOSIS — I4891 Unspecified atrial fibrillation: Secondary | ICD-10-CM | POA: Diagnosis not present

## 2020-02-08 DIAGNOSIS — R601 Generalized edema: Secondary | ICD-10-CM | POA: Diagnosis not present

## 2020-02-08 DIAGNOSIS — R109 Unspecified abdominal pain: Secondary | ICD-10-CM | POA: Diagnosis not present

## 2020-02-08 DIAGNOSIS — R600 Localized edema: Secondary | ICD-10-CM | POA: Diagnosis not present

## 2020-02-08 DIAGNOSIS — Z86718 Personal history of other venous thrombosis and embolism: Secondary | ICD-10-CM | POA: Diagnosis not present

## 2020-02-08 DIAGNOSIS — Z7984 Long term (current) use of oral hypoglycemic drugs: Secondary | ICD-10-CM | POA: Diagnosis not present

## 2020-02-08 DIAGNOSIS — J449 Chronic obstructive pulmonary disease, unspecified: Secondary | ICD-10-CM | POA: Diagnosis not present

## 2020-02-08 DIAGNOSIS — Z8673 Personal history of transient ischemic attack (TIA), and cerebral infarction without residual deficits: Secondary | ICD-10-CM | POA: Diagnosis not present

## 2020-02-08 DIAGNOSIS — R0600 Dyspnea, unspecified: Secondary | ICD-10-CM | POA: Diagnosis not present

## 2020-02-08 DIAGNOSIS — I10 Essential (primary) hypertension: Secondary | ICD-10-CM | POA: Diagnosis not present

## 2020-02-08 DIAGNOSIS — W1830XA Fall on same level, unspecified, initial encounter: Secondary | ICD-10-CM | POA: Diagnosis not present

## 2020-02-08 DIAGNOSIS — Z79899 Other long term (current) drug therapy: Secondary | ICD-10-CM | POA: Diagnosis not present

## 2020-02-08 DIAGNOSIS — E119 Type 2 diabetes mellitus without complications: Secondary | ICD-10-CM | POA: Diagnosis not present

## 2020-02-08 DIAGNOSIS — I517 Cardiomegaly: Secondary | ICD-10-CM | POA: Diagnosis not present

## 2020-02-11 DIAGNOSIS — E1165 Type 2 diabetes mellitus with hyperglycemia: Secondary | ICD-10-CM | POA: Diagnosis not present

## 2020-02-11 DIAGNOSIS — I10 Essential (primary) hypertension: Secondary | ICD-10-CM | POA: Diagnosis not present

## 2020-02-11 DIAGNOSIS — Z7901 Long term (current) use of anticoagulants: Secondary | ICD-10-CM | POA: Diagnosis not present

## 2020-02-11 DIAGNOSIS — R0600 Dyspnea, unspecified: Secondary | ICD-10-CM | POA: Diagnosis not present

## 2020-02-11 DIAGNOSIS — R109 Unspecified abdominal pain: Secondary | ICD-10-CM | POA: Diagnosis not present

## 2020-02-11 DIAGNOSIS — R601 Generalized edema: Secondary | ICD-10-CM | POA: Diagnosis not present

## 2020-02-11 DIAGNOSIS — R600 Localized edema: Secondary | ICD-10-CM | POA: Diagnosis not present

## 2020-02-14 ENCOUNTER — Encounter (HOSPITAL_COMMUNITY): Payer: Self-pay | Admitting: *Deleted

## 2020-02-14 ENCOUNTER — Emergency Department (HOSPITAL_COMMUNITY): Payer: Medicare Other

## 2020-02-14 ENCOUNTER — Emergency Department (HOSPITAL_COMMUNITY)
Admission: EM | Admit: 2020-02-14 | Discharge: 2020-02-14 | Discharge status: Against Medical Advice | Payer: Medicare Other | Source: Home / Self Care | Attending: Emergency Medicine | Admitting: Emergency Medicine

## 2020-02-14 ENCOUNTER — Other Ambulatory Visit: Payer: Self-pay

## 2020-02-14 DIAGNOSIS — Z8249 Family history of ischemic heart disease and other diseases of the circulatory system: Secondary | ICD-10-CM | POA: Diagnosis not present

## 2020-02-14 DIAGNOSIS — E119 Type 2 diabetes mellitus without complications: Secondary | ICD-10-CM | POA: Insufficient documentation

## 2020-02-14 DIAGNOSIS — Z7984 Long term (current) use of oral hypoglycemic drugs: Secondary | ICD-10-CM | POA: Insufficient documentation

## 2020-02-14 DIAGNOSIS — R17 Unspecified jaundice: Secondary | ICD-10-CM | POA: Diagnosis not present

## 2020-02-14 DIAGNOSIS — I4510 Unspecified right bundle-branch block: Secondary | ICD-10-CM | POA: Diagnosis not present

## 2020-02-14 DIAGNOSIS — E876 Hypokalemia: Secondary | ICD-10-CM

## 2020-02-14 DIAGNOSIS — I4891 Unspecified atrial fibrillation: Secondary | ICD-10-CM | POA: Insufficient documentation

## 2020-02-14 DIAGNOSIS — R531 Weakness: Secondary | ICD-10-CM | POA: Diagnosis not present

## 2020-02-14 DIAGNOSIS — I480 Paroxysmal atrial fibrillation: Secondary | ICD-10-CM | POA: Diagnosis not present

## 2020-02-14 DIAGNOSIS — Z9981 Dependence on supplemental oxygen: Secondary | ICD-10-CM | POA: Insufficient documentation

## 2020-02-14 DIAGNOSIS — I110 Hypertensive heart disease with heart failure: Secondary | ICD-10-CM | POA: Diagnosis not present

## 2020-02-14 DIAGNOSIS — I69351 Hemiplegia and hemiparesis following cerebral infarction affecting right dominant side: Secondary | ICD-10-CM | POA: Diagnosis not present

## 2020-02-14 DIAGNOSIS — Z9114 Patient's other noncompliance with medication regimen: Secondary | ICD-10-CM | POA: Diagnosis not present

## 2020-02-14 DIAGNOSIS — Z833 Family history of diabetes mellitus: Secondary | ICD-10-CM | POA: Diagnosis not present

## 2020-02-14 DIAGNOSIS — R109 Unspecified abdominal pain: Secondary | ICD-10-CM | POA: Diagnosis not present

## 2020-02-14 DIAGNOSIS — R601 Generalized edema: Secondary | ICD-10-CM | POA: Diagnosis not present

## 2020-02-14 DIAGNOSIS — J9610 Chronic respiratory failure, unspecified whether with hypoxia or hypercapnia: Secondary | ICD-10-CM | POA: Insufficient documentation

## 2020-02-14 DIAGNOSIS — E1165 Type 2 diabetes mellitus with hyperglycemia: Secondary | ICD-10-CM | POA: Diagnosis not present

## 2020-02-14 DIAGNOSIS — F17210 Nicotine dependence, cigarettes, uncomplicated: Secondary | ICD-10-CM | POA: Insufficient documentation

## 2020-02-14 DIAGNOSIS — J9621 Acute and chronic respiratory failure with hypoxia: Secondary | ICD-10-CM | POA: Diagnosis not present

## 2020-02-14 DIAGNOSIS — Z79899 Other long term (current) drug therapy: Secondary | ICD-10-CM | POA: Insufficient documentation

## 2020-02-14 DIAGNOSIS — I5033 Acute on chronic diastolic (congestive) heart failure: Secondary | ICD-10-CM | POA: Diagnosis not present

## 2020-02-14 DIAGNOSIS — I252 Old myocardial infarction: Secondary | ICD-10-CM | POA: Diagnosis not present

## 2020-02-14 DIAGNOSIS — R0602 Shortness of breath: Secondary | ICD-10-CM | POA: Diagnosis not present

## 2020-02-14 DIAGNOSIS — Z7901 Long term (current) use of anticoagulants: Secondary | ICD-10-CM | POA: Insufficient documentation

## 2020-02-14 DIAGNOSIS — Z20822 Contact with and (suspected) exposure to covid-19: Secondary | ICD-10-CM | POA: Insufficient documentation

## 2020-02-14 DIAGNOSIS — I10 Essential (primary) hypertension: Secondary | ICD-10-CM | POA: Insufficient documentation

## 2020-02-14 DIAGNOSIS — N179 Acute kidney failure, unspecified: Secondary | ICD-10-CM | POA: Diagnosis not present

## 2020-02-14 DIAGNOSIS — J449 Chronic obstructive pulmonary disease, unspecified: Secondary | ICD-10-CM | POA: Diagnosis not present

## 2020-02-14 LAB — COMPREHENSIVE METABOLIC PANEL
ALT: 29 U/L (ref 0–44)
AST: 30 U/L (ref 15–41)
Albumin: 2.9 g/dL — ABNORMAL LOW (ref 3.5–5.0)
Alkaline Phosphatase: 196 U/L — ABNORMAL HIGH (ref 38–126)
Anion gap: 14 (ref 5–15)
BUN: 36 mg/dL — ABNORMAL HIGH (ref 6–20)
CO2: 24 mmol/L (ref 22–32)
Calcium: 7.7 mg/dL — ABNORMAL LOW (ref 8.9–10.3)
Chloride: 103 mmol/L (ref 98–111)
Creatinine, Ser: 2.02 mg/dL — ABNORMAL HIGH (ref 0.44–1.00)
GFR calc Af Amer: 31 mL/min — ABNORMAL LOW (ref >60)
GFR calc non Af Amer: 26 mL/min — ABNORMAL LOW (ref >60)
Glucose, Bld: 156 mg/dL — ABNORMAL HIGH (ref 70–99)
Potassium: 2.7 mmol/L — CL (ref 3.5–5.1)
Sodium: 141 mmol/L (ref 135–145)
Total Bilirubin: 2.2 mg/dL — ABNORMAL HIGH (ref 0.3–1.2)
Total Protein: 5.9 g/dL — ABNORMAL LOW (ref 6.5–8.1)

## 2020-02-14 LAB — CBC WITH DIFFERENTIAL/PLATELET
Abs Immature Granulocytes: 0.02 K/uL (ref 0.00–0.07)
Basophils Absolute: 0 K/uL (ref 0.0–0.1)
Basophils Relative: 1 %
Eosinophils Absolute: 0 K/uL (ref 0.0–0.5)
Eosinophils Relative: 0 %
HCT: 40.2 % (ref 36.0–46.0)
Hemoglobin: 11.6 g/dL — ABNORMAL LOW (ref 12.0–15.0)
Immature Granulocytes: 0 %
Lymphocytes Relative: 23 %
Lymphs Abs: 1.1 K/uL (ref 0.7–4.0)
MCH: 27.4 pg (ref 26.0–34.0)
MCHC: 28.9 g/dL — ABNORMAL LOW (ref 30.0–36.0)
MCV: 94.8 fL (ref 80.0–100.0)
Monocytes Absolute: 0.3 K/uL (ref 0.1–1.0)
Monocytes Relative: 6 %
Neutro Abs: 3.2 K/uL (ref 1.7–7.7)
Neutrophils Relative %: 70 %
Platelets: 108 K/uL — ABNORMAL LOW (ref 150–400)
RBC: 4.24 MIL/uL (ref 3.87–5.11)
RDW: 20.5 % — ABNORMAL HIGH (ref 11.5–15.5)
WBC: 4.6 K/uL (ref 4.0–10.5)
nRBC: 0.4 % — ABNORMAL HIGH (ref 0.0–0.2)

## 2020-02-14 LAB — PROTIME-INR
INR: 2.9 — ABNORMAL HIGH (ref 0.8–1.2)
Prothrombin Time: 30 seconds — ABNORMAL HIGH (ref 11.4–15.2)

## 2020-02-14 LAB — POC SARS CORONAVIRUS 2 AG -  ED: SARS Coronavirus 2 Ag: NEGATIVE

## 2020-02-14 LAB — BRAIN NATRIURETIC PEPTIDE: B Natriuretic Peptide: 599 pg/mL — ABNORMAL HIGH (ref 0.0–100.0)

## 2020-02-14 MED ORDER — POTASSIUM CHLORIDE CRYS ER 20 MEQ PO TBCR: 20.0000 meq | EXTENDED_RELEASE_TABLET | Freq: Three times a day (TID) | ORAL | 0 refills | Status: DC

## 2020-02-14 MED ORDER — POTASSIUM CHLORIDE CRYS ER 20 MEQ PO TBCR
60.0000 meq | EXTENDED_RELEASE_TABLET | Freq: Once | ORAL | Status: AC
  Administered 2020-02-14: 60 meq via ORAL
  Filled 2020-02-14: qty 3

## 2020-02-14 NOTE — ED Provider Notes (Signed)
[facility] EMERGENCY DEPARTMENT Provider Note   CSN: [id number] Arrival date & time: 02/14/20  1537     History Chief Complaint  Patient presents with  . Shortness of Breath    Tracy [name] is a 59 y.o. female with a history of chronic respiratory failure on 2L via [place] PRN at home, prior stroke, diabetes mellitus, hypertension, prior DVT on warfarin, and COPD who presents to the ED for evaluation of progressively worsening shortness of breath over the past few weeks. Patient states that she feels constantly short of breath, worse with exertion, associated with lower extremity & abdominal swelling. States she has been taking Lasix 20 mg BID, her PCP increased this dose to 80 mg BID but she had not realize she had not picked up the new dose.  Per the notes sent in from her primary care office she has gained 20 pounds since 02/11/2020.  She was seen at [facility] to 9/21 but left AMA.  She denies fever, chills, cough, chest pain, abdominal pain, or syncope.    HPI     Past Medical History:  Diagnosis Date  . Acute ischemic stroke ([place]) 07/03/2012  . Crack cocaine use   . Diabetes ([place])   . Diabetes mellitus ([place])   . DVT (deep venous thrombosis) ([place])   . Hemiparesis, acute ([place]) 07/04/2012  . Hypertension   . LVH (left ventricular hypertrophy) 07/04/2012   Ejection fraction 60%.  . Myocardial infarct, old   . Thrombocytopenia ([place]) 07/03/2012  . Tobacco abuse     Patient Active Problem List   Diagnosis Date Noted  . Right bundle branch block 09/05/2017  . Long term current use of anticoagulant therapy 03/06/2017  . Body mass index (BMI) of 40.0-44.9 in adult ([place]) 02/20/2017  . Personal history of venous thrombosis and embolism 02/20/2017  . Umbilical hernia [id number]  . Phlebitis and thrombophlebitis of lower extremities 01/13/2017  . Left knee pain 03/05/2016  . Influenza with respiratory manifestations 03/22/2015  . COPD exacerbation ([place]) 03/21/2015  . CAP  (community acquired pneumonia) 07/04/2013  . Sepsis ([place]) 07/04/2013  . Acute renal failure ([place]) 07/04/2013  . Left thyroid nodule 07/04/2013  . Essential hypertension 07/04/2013  . Diabetes ([place]) 07/04/2013  . Acute respiratory failure ([place]) 07/04/2013  . Hemiparesis, acute ([place]) 07/04/2012  . LVH (left ventricular hypertrophy) 07/04/2012  . Acute ischemic stroke ([place]) 07/03/2012  . Hypertensive urgency 07/03/2012  . Tobacco abuse 07/03/2012  . Noncompliance 07/03/2012  . Thrombocytopenia ([place]) 07/03/2012  . Crack cocaine use 07/03/2012    Past Surgical History:  Procedure Laterality Date  . ABDOMINAL HYSTERECTOMY    . HERNIA REPAIR       OB History    Gravida  2   Para  2   Term  2   Preterm      AB      Living        SAB      TAB      Ectopic      Multiple      Live Births              Family History  Problem Relation Age of Onset  . Heart disease Mother   . Diabetes Mother   . Stroke Mother   . Hypertension Mother   . Hypertension Father   . Heart disease Father   . Diabetes Brother     Social History   Tobacco Use  . Smoking status: Current Every  Day Smoker    Packs/day: 1.00    Types: Cigarettes  . Smokeless tobacco: Never Used  Substance Use Topics  . Alcohol use: No  . Drug use: No    Comment: last used 2 years ago    Home Medications Prior to Admission medications   Medication Sig Start Date End Date Taking? Authorizing Provider  albuterol (PROVENTIL HFA;VENTOLIN HFA) 108 (90 BASE) MCG/ACT inhaler Inhale 2 puffs into the lungs every 4 (four) hours as needed for wheezing or shortness of breath. 07/09/13   Samuella Cota, MD  atorvastatin (LIPITOR) 10 MG tablet  06/06/19   [provider]  budesonide-formoterol (SYMBICORT) 160-4.5 MCG/ACT inhaler Inhale 2 puffs into the lungs 2 (two) times daily.    [provider]  furosemide (LASIX) 20 MG tablet Take 20 mg by mouth.    [provider]  gabapentin  (NEURONTIN) 300 MG capsule Take 300 mg by mouth 3 (three) times daily.    [provider]  hydrALAZINE (APRESOLINE) 25 MG tablet Take 1 tablet (25 mg total) by mouth every 8 (eight) hours. Patient taking differently: Take 25 mg by mouth daily.  03/25/15   Kathie Dike, MD  HYDROcodone-acetaminophen (NORCO) 7.5-325 MG tablet Take 1 tablet by mouth every 6 (six) hours as needed for moderate pain (Must last 30 days). 02/01/20   Sanjuana Kava, MD  JARDIANCE 25 MG TABS tablet Take 25 mg by mouth daily.  01/02/18   [provider]  lisinopril-hydrochlorothiazide (PRINZIDE,ZESTORETIC) 20-12.5 MG per tablet Take 2 tablets by mouth daily.    [provider]  metFORMIN (GLUCOPHAGE-XR) 500 MG 24 hr tablet Take 1,000 mg by mouth 2 (two) times a day.     [provider]  metFORMIN (GLUMETZA) 1000 MG (MOD) 24 hr tablet Take 1 tablet by mouth 2 (two) times a day. 03/08/19   [provider]  metoprolol succinate (TOPROL-XL) 25 MG 24 hr tablet Take 25 mg by mouth daily.  12/05/17   [provider]  SPIRIVA RESPIMAT 2.5 MCG/ACT AERS Inhale 1 puff into the lungs daily. 05/12/19   [provider]  warfarin (COUMADIN) 2.5 MG tablet Take 2.5 mg by mouth daily. as directed 05/26/18   [provider]    Allergies    Patient has no known allergies.  Review of Systems   Review of Systems  Constitutional: Negative for chills and fever.  Respiratory: Positive for shortness of breath. Negative for cough.   Cardiovascular: Positive for leg swelling. Negative for chest pain.  Gastrointestinal: Positive for abdominal distention. Negative for abdominal pain, diarrhea, nausea and vomiting.  Genitourinary: Negative for dysuria.  Neurological: Negative for syncope.  All other systems reviewed and are negative.   Physical Exam Updated Vital Signs BP 98/76   Pulse 92   Resp (!) 28   Ht 5\' 4"  (1.626 m)   Wt 117.9 kg   SpO2 90%   BMI 44.63 kg/m Temp: 98.1  F orally.   Physical Exam Vitals and nursing note reviewed.  HENT:     Head: Normocephalic and atraumatic.  Eyes:     Pupils: Pupils are equal, round, and reactive to light.  Cardiovascular:     Rate and Rhythm: Rhythm irregularly irregular.     Pulses:          Radial pulses are 2+ on the right side and 2+ on the left side.  Pulmonary:     Effort: Tachypnea present.     Breath sounds: Decreased breath sounds (  bibasilar) present.     Comments: SPO2 88 to 91% on her baseline 2 L via nasal cannula, increased to 3 L with improvement. Abdominal:     Tenderness: There is no abdominal tenderness. There is no guarding or rebound.     Comments: Lower abdominal edema present.  Musculoskeletal:     Cervical back: Neck supple.     Comments: Symmetric pitting edema to the bilateral lower extremities without overlying erythema or warmth.  Skin:    General: Skin is warm and dry.  Neurological:     Mental Status: She is alert.     Comments: Clear speech.     ED Results / Procedures / Treatments   Labs (all labs ordered are listed, but only abnormal results are displayed) Labs Reviewed  COMPREHENSIVE METABOLIC PANEL - Abnormal; Notable for the following components:      Result Value   Potassium 2.7 (*)    Glucose, Bld 156 (*)    BUN 36 (*)    Creatinine, Ser 2.02 (*)    Calcium 7.7 (*)    Total Protein 5.9 (*)    Albumin 2.9 (*)    Alkaline Phosphatase 196 (*)    Total Bilirubin 2.2 (*)    GFR calc non Af Amer 26 (*)    GFR calc Af Amer 31 (*)    All other components within normal limits  CBC WITH DIFFERENTIAL/PLATELET - Abnormal; Notable for the following components:   Hemoglobin 11.6 (*)    MCHC 28.9 (*)    RDW 20.5 (*)    Platelets 108 (*)    nRBC 0.4 (*)    All other components within normal limits  BRAIN NATRIURETIC PEPTIDE - Abnormal; Notable for the following components:   B Natriuretic Peptide 599.0 (*)    All other components within normal limits  PROTIME-INR -  Abnormal; Notable for the following components:   Prothrombin Time 30.0 (*)    INR 2.9 (*)    All other components within normal limits  POC SARS CORONAVIRUS 2 AG -  ED    EKG EKG Interpretation  Date/Time:  Monday February 14 2020 17:02:22 EST Ventricular Rate:  106 PR Interval:    QRS Duration: 174 QT Interval:  403 QTC Calculation: 536 R Axis:   49 Text Interpretation: Atrial fibrillation Right bundle branch block Confirmed by Milton Ferguson [phone]) on 02/14/2020 5:07:01 PM   Radiology DG Chest Port 1 View  Result Date: 02/14/2020 CLINICAL DATA:  Shortness of breath and fluid retention. EXAM: PORTABLE CHEST 1 VIEW COMPARISON:  PA and lateral chest 02/08/2020 and 05/20/2019. FINDINGS: There is marked cardiomegaly and pulmonary vascular congestion. No consolidative process, pneumothorax or effusion. Atherosclerosis is noted. No acute or focal bony abnormality. IMPRESSION: Cardiomegaly and vascular congestion. Atherosclerosis. Electronically Signed   By: Inge Rise M.D.   On: 02/14/2020 17:45    Procedures Procedures (including critical care time)  Medications Ordered in ED Medications - No data to display  ED Course  I have reviewed the triage vital signs and the nursing notes.  Pertinent labs & imaging results that were available during my care of the patient were reviewed by me and considered in my medical decision making (see chart for details).  Rapid covid negative.   Alegria C Wilking was evaluated in Emergency Department on 02/14/2020 for the symptoms described in the history of present illness. He/she was evaluated in the context of the global COVID-19 pandemic, which necessitated consideration that the patient might be at  risk for infection with the SARS-CoV-2 virus that causes COVID-19. Institutional protocols and algorithms that pertain to the evaluation of patients at risk for COVID-19 are in a state of rapid change based on information released by  regulatory bodies including the CDC and federal and state organizations. These policies and algorithms were followed during the patient's care in the ED.    MDM Rules/Calculators/A&P                      Patient presents to the emergency department with complaints of progressively worsening shortness of breath over the past few weeks.  Per information sent by PCP it appears that she has gained 20 pounds in the past few days.  On arrival she is tachypneic, requiring 1 additional liter of oxygen from her baseline at home, and appears to be in A. fib.  She thinks she has a history of an irregular heart rhythm but she is unsure.  CHA2DS2-VASc Score = 6 as she does not carry a formal diagnosis of heart failure.  She is currently on Coumadin.  Primary concern is for CHF, additionally considering pneumonia, critical anemia, electrolyte derangement, acute renal failure, acute hepatic failure, or PE.  CBC reveals anemia similar to prior labs the patient has brought with her from her PCP office.  No leukocytosis. CMP: Hypokalemia at 2.7, also hypocalcemic at 7.7.  AKI with creatinine 2.02 today, most recently 1.59. PT/INR: Therapeutic BNP: Elevated at 599 1.59- creatinine  CXR: Cardiomegaly and vascular congestion. Atherosclerosis.  Primary concern is for CHF, anticipate complex management given acute kidney injury and hypokalemia, additionally patient with increased baseline oxygen requirement.  We have recommended admission to the hospital.  Patient is adamant that she needs to go home, she states she will come back tomorrow. Patient signing out [place]- The patient and I discussed the nature and purpose, risks and benefits, as well as, the alternatives of treatment. Time was given to allow the opportunity to ask questions and consider options. After the discussion, the patient decided to refuse admission The patient was informed that refusal could lead to, but was not limited to, death,  permanent disability, or severe pain. Prior to refusing, I determined that the patient had the capacity to make their decision and understood the consequences of that decision. After refusal, I made every reasonable effort to treat them to the best of my ability. Will given potassium in the ED as well as prescription to go home with, she has a prescription for increased dose lasix. She states she will come back tomorrow which I very much encouraged after she was adamant that she was leaving tonight, encouraged to come back sooner if possible. The patient was notified that they may return to the emergency department at any time for further treatment.    This is a shared visit with supervising physician Dr. Roderic Palau who has independently evaluated patient & provided guidance in evaluation/management, in agreement.   Final Clinical Impression(s) / ED Diagnoses Final diagnoses:  Anasarca  Atrial fibrillation, unspecified type ([place])  Hypokalemia    Rx / DC Orders ED Discharge Orders         Ordered    potassium chloride SA (KLOR-CON) 20 MEQ tablet  3 times daily     02/14/20 1905           Leafy Kindle 02/14/20 1912    Milton Ferguson, MD 02/14/20 2237

## 2020-02-14 NOTE — ED Notes (Signed)
Pt attempted to sign for AMA, signature pad not working at this time.

## 2020-02-14 NOTE — Discharge Instructions (Addendum)
You were seen in the emergency department today for shortness of breath.  We are concerned you have heart failure.  Your chest x-ray shows a lot of fluid in your chest.  Your labs show that your potassium was very low and that you are having issues with your kidney function.  Your heart appears to be in a new rhythm called atrial fibrillation-please discuss with your primary care provider, we have also given you information for cardiology for follow-up.  We recommended you stay in the emergency department and be admitted to the hospital.  You are leaving the emergency department [place].  We are sending you home with potassium supplement to take.  We have prescribed you new medication(s) today. Discuss the medications prescribed today with your pharmacist as they can have adverse effects and interactions with your other medicines including over the counter and prescribed medications. Seek medical evaluation if you start to experience new or abnormal symptoms after taking one of these medicines, seek care immediately if you start to experience difficulty breathing, feeling of your throat closing, facial swelling, or rash as these could be indications of a more serious allergic reaction   We would like you to return to the emergency department whenever able.  Please return for new or worsening symptoms or any other concerns.

## 2020-02-14 NOTE — ED Notes (Signed)
Date and time results received: 02/14/20 1823   Test: Potassium  Critical Value: 2.7  Name of Provider Notified: Kennith Maes, PA  Orders Received? Or Actions Taken?: No new orders given.

## 2020-02-14 NOTE — ED Triage Notes (Signed)
Patient was sent from [facility] for shortness of breath and fluid retention

## 2020-02-14 NOTE — ED Provider Notes (Signed)
Medical screening examination/treatment/procedure(s) were conducted as a shared visit with non-physician practitioner(s) and myself.  I personally evaluated the patient during the encounter.  EKG Interpretation  Date/Time:  Monday February 14 2020 17:02:22 EST Ventricular Rate:  106 PR Interval:    QRS Duration: 174 QT Interval:  403 QTC Calculation: 536 R Axis:   49 Text Interpretation: Atrial fibrillation Right bundle branch block Confirmed by Milton Ferguson [phone]) on 02/14/2020 5:07:01 PM     Patient presents with swelling in her legs and abdomen.  Physical exam patient has anasarca with edema all the way up her abdomen   Milton Ferguson, MD 02/14/20 [phone]

## 2020-02-15 ENCOUNTER — Inpatient Hospital Stay (HOSPITAL_COMMUNITY)
Admission: EM | Admit: 2020-02-15 | Discharge: 2020-02-21 | DRG: 291 | Disposition: A | Discharge status: Home Health | Payer: Medicare Other | Attending: Family Medicine | Admitting: Family Medicine

## 2020-02-15 ENCOUNTER — Encounter (HOSPITAL_COMMUNITY): Payer: Self-pay

## 2020-02-15 ENCOUNTER — Inpatient Hospital Stay (HOSPITAL_COMMUNITY): Payer: Medicare Other

## 2020-02-15 ENCOUNTER — Other Ambulatory Visit: Payer: Self-pay

## 2020-02-15 DIAGNOSIS — R601 Generalized edema: Secondary | ICD-10-CM | POA: Diagnosis not present

## 2020-02-15 DIAGNOSIS — Z833 Family history of diabetes mellitus: Secondary | ICD-10-CM

## 2020-02-15 DIAGNOSIS — J9601 Acute respiratory failure with hypoxia: Secondary | ICD-10-CM | POA: Diagnosis present

## 2020-02-15 DIAGNOSIS — Z9981 Dependence on supplemental oxygen: Secondary | ICD-10-CM

## 2020-02-15 DIAGNOSIS — Z720 Tobacco use: Secondary | ICD-10-CM | POA: Diagnosis not present

## 2020-02-15 DIAGNOSIS — I340 Nonrheumatic mitral (valve) insufficiency: Secondary | ICD-10-CM | POA: Diagnosis not present

## 2020-02-15 DIAGNOSIS — I69351 Hemiplegia and hemiparesis following cerebral infarction affecting right dominant side: Secondary | ICD-10-CM | POA: Diagnosis not present

## 2020-02-15 DIAGNOSIS — E1165 Type 2 diabetes mellitus with hyperglycemia: Secondary | ICD-10-CM | POA: Diagnosis present

## 2020-02-15 DIAGNOSIS — Z9114 Patient's other noncompliance with medication regimen: Secondary | ICD-10-CM

## 2020-02-15 DIAGNOSIS — Z91199 Patient's noncompliance with other medical treatment and regimen due to unspecified reason: Secondary | ICD-10-CM

## 2020-02-15 DIAGNOSIS — J9621 Acute and chronic respiratory failure with hypoxia: Secondary | ICD-10-CM | POA: Diagnosis not present

## 2020-02-15 DIAGNOSIS — I252 Old myocardial infarction: Secondary | ICD-10-CM

## 2020-02-15 DIAGNOSIS — Z79899 Other long term (current) drug therapy: Secondary | ICD-10-CM

## 2020-02-15 DIAGNOSIS — I4510 Unspecified right bundle-branch block: Secondary | ICD-10-CM | POA: Diagnosis present

## 2020-02-15 DIAGNOSIS — Z8249 Family history of ischemic heart disease and other diseases of the circulatory system: Secondary | ICD-10-CM

## 2020-02-15 DIAGNOSIS — N179 Acute kidney failure, unspecified: Secondary | ICD-10-CM | POA: Diagnosis not present

## 2020-02-15 DIAGNOSIS — R0602 Shortness of breath: Secondary | ICD-10-CM

## 2020-02-15 DIAGNOSIS — Z20822 Contact with and (suspected) exposure to covid-19: Secondary | ICD-10-CM | POA: Diagnosis present

## 2020-02-15 DIAGNOSIS — Z823 Family history of stroke: Secondary | ICD-10-CM

## 2020-02-15 DIAGNOSIS — E876 Hypokalemia: Secondary | ICD-10-CM | POA: Diagnosis not present

## 2020-02-15 DIAGNOSIS — J449 Chronic obstructive pulmonary disease, unspecified: Secondary | ICD-10-CM | POA: Diagnosis not present

## 2020-02-15 DIAGNOSIS — R531 Weakness: Secondary | ICD-10-CM | POA: Diagnosis present

## 2020-02-15 DIAGNOSIS — I509 Heart failure, unspecified: Secondary | ICD-10-CM

## 2020-02-15 DIAGNOSIS — I10 Essential (primary) hypertension: Secondary | ICD-10-CM | POA: Diagnosis present

## 2020-02-15 DIAGNOSIS — I110 Hypertensive heart disease with heart failure: Secondary | ICD-10-CM | POA: Diagnosis not present

## 2020-02-15 DIAGNOSIS — I480 Paroxysmal atrial fibrillation: Secondary | ICD-10-CM | POA: Diagnosis present

## 2020-02-15 DIAGNOSIS — Z7901 Long term (current) use of anticoagulants: Secondary | ICD-10-CM

## 2020-02-15 DIAGNOSIS — Z9119 Patient's noncompliance with other medical treatment and regimen: Secondary | ICD-10-CM

## 2020-02-15 DIAGNOSIS — Z8673 Personal history of transient ischemic attack (TIA), and cerebral infarction without residual deficits: Secondary | ICD-10-CM

## 2020-02-15 DIAGNOSIS — I82409 Acute embolism and thrombosis of unspecified deep veins of unspecified lower extremity: Secondary | ICD-10-CM | POA: Diagnosis present

## 2020-02-15 DIAGNOSIS — R17 Unspecified jaundice: Secondary | ICD-10-CM | POA: Diagnosis present

## 2020-02-15 DIAGNOSIS — Z6841 Body Mass Index (BMI) 40.0 and over, adult: Secondary | ICD-10-CM

## 2020-02-15 DIAGNOSIS — E119 Type 2 diabetes mellitus without complications: Secondary | ICD-10-CM

## 2020-02-15 DIAGNOSIS — I5033 Acute on chronic diastolic (congestive) heart failure: Secondary | ICD-10-CM | POA: Diagnosis not present

## 2020-02-15 DIAGNOSIS — Z7984 Long term (current) use of oral hypoglycemic drugs: Secondary | ICD-10-CM

## 2020-02-15 DIAGNOSIS — Z90710 Acquired absence of both cervix and uterus: Secondary | ICD-10-CM

## 2020-02-15 DIAGNOSIS — I361 Nonrheumatic tricuspid (valve) insufficiency: Secondary | ICD-10-CM | POA: Diagnosis not present

## 2020-02-15 DIAGNOSIS — F17210 Nicotine dependence, cigarettes, uncomplicated: Secondary | ICD-10-CM | POA: Diagnosis present

## 2020-02-15 DIAGNOSIS — J9811 Atelectasis: Secondary | ICD-10-CM | POA: Diagnosis not present

## 2020-02-15 DIAGNOSIS — I4891 Unspecified atrial fibrillation: Secondary | ICD-10-CM | POA: Diagnosis not present

## 2020-02-15 DIAGNOSIS — F1410 Cocaine abuse, uncomplicated: Secondary | ICD-10-CM | POA: Diagnosis present

## 2020-02-15 DIAGNOSIS — IMO0002 Reserved for concepts with insufficient information to code with codable children: Secondary | ICD-10-CM | POA: Diagnosis present

## 2020-02-15 DIAGNOSIS — Z86718 Personal history of other venous thrombosis and embolism: Secondary | ICD-10-CM

## 2020-02-15 LAB — BRAIN NATRIURETIC PEPTIDE: B Natriuretic Peptide: 454 pg/mL — ABNORMAL HIGH (ref 0.0–100.0)

## 2020-02-15 LAB — CBC
HCT: 39.1 % (ref 36.0–46.0)
Hemoglobin: 11.2 g/dL — ABNORMAL LOW (ref 12.0–15.0)
MCH: 27.5 pg (ref 26.0–34.0)
MCHC: 28.6 g/dL — ABNORMAL LOW (ref 30.0–36.0)
MCV: 96.1 fL (ref 80.0–100.0)
Platelets: 100 10*3/uL — ABNORMAL LOW (ref 150–400)
RBC: 4.07 MIL/uL (ref 3.87–5.11)
RDW: 20.8 % — ABNORMAL HIGH (ref 11.5–15.5)
WBC: 5.3 10*3/uL (ref 4.0–10.5)
nRBC: 0.4 % — ABNORMAL HIGH (ref 0.0–0.2)

## 2020-02-15 LAB — COMPREHENSIVE METABOLIC PANEL
ALT: 30 U/L (ref 0–44)
AST: 31 U/L (ref 15–41)
Albumin: 3 g/dL — ABNORMAL LOW (ref 3.5–5.0)
Alkaline Phosphatase: 193 U/L — ABNORMAL HIGH (ref 38–126)
Anion gap: 13 (ref 5–15)
BUN: 35 mg/dL — ABNORMAL HIGH (ref 6–20)
CO2: 24 mmol/L (ref 22–32)
Calcium: 7.6 mg/dL — ABNORMAL LOW (ref 8.9–10.3)
Chloride: 105 mmol/L (ref 98–111)
Creatinine, Ser: 2.02 mg/dL — ABNORMAL HIGH (ref 0.44–1.00)
GFR calc Af Amer: 31 mL/min — ABNORMAL LOW (ref >60)
GFR calc non Af Amer: 26 mL/min — ABNORMAL LOW (ref >60)
Glucose, Bld: 116 mg/dL — ABNORMAL HIGH (ref 70–99)
Potassium: 2.6 mmol/L — CL (ref 3.5–5.1)
Sodium: 142 mmol/L (ref 135–145)
Total Bilirubin: 2.3 mg/dL — ABNORMAL HIGH (ref 0.3–1.2)
Total Protein: 6 g/dL — ABNORMAL LOW (ref 6.5–8.1)

## 2020-02-15 LAB — HEMOGLOBIN A1C
Hgb A1c MFr Bld: 7.5 % — ABNORMAL HIGH (ref 4.8–5.6)
Mean Plasma Glucose: 168.55 mg/dL

## 2020-02-15 LAB — TSH: TSH: 1.96 u[IU]/mL (ref 0.350–4.500)

## 2020-02-15 LAB — PROTIME-INR
INR: 3.1 — ABNORMAL HIGH (ref 0.8–1.2)
Prothrombin Time: 32 seconds — ABNORMAL HIGH (ref 11.4–15.2)

## 2020-02-15 LAB — MAGNESIUM: Magnesium: 1.4 mg/dL — ABNORMAL LOW (ref 1.7–2.4)

## 2020-02-15 LAB — RAPID URINE DRUG SCREEN, HOSP PERFORMED
Amphetamines: NOT DETECTED
Barbiturates: NOT DETECTED
Benzodiazepines: NOT DETECTED
Cocaine: NOT DETECTED
Opiates: POSITIVE — AB
Tetrahydrocannabinol: NOT DETECTED

## 2020-02-15 LAB — TROPONIN I (HIGH SENSITIVITY)
Troponin I (High Sensitivity): 63 ng/L — ABNORMAL HIGH (ref <18)
Troponin I (High Sensitivity): 61 ng/L — ABNORMAL HIGH (ref <18)

## 2020-02-15 LAB — CBG MONITORING, ED
Glucose-Capillary: 133 mg/dL — ABNORMAL HIGH (ref 70–99)
Glucose-Capillary: 93 mg/dL (ref 70–99)

## 2020-02-15 LAB — SARS CORONAVIRUS 2 (TAT 6-24 HRS): SARS Coronavirus 2: NEGATIVE

## 2020-02-15 MED ORDER — POTASSIUM CHLORIDE CRYS ER 20 MEQ PO TBCR
20.0000 meq | EXTENDED_RELEASE_TABLET | Freq: Three times a day (TID) | ORAL | Status: DC
  Administered 2020-02-15 – 2020-02-18 (×10): 20 meq via ORAL
  Filled 2020-02-15 (×11): qty 1

## 2020-02-15 MED ORDER — ONDANSETRON HCL 4 MG PO TABS: 4.0000 mg | ORAL_TABLET | Freq: Four times a day (QID) | ORAL | Status: DC | PRN

## 2020-02-15 MED ORDER — ATORVASTATIN CALCIUM 10 MG PO TABS
10.0000 mg | ORAL_TABLET | Freq: Every day | ORAL | Status: DC
  Administered 2020-02-16 – 2020-02-20 (×5): 10 mg via ORAL
  Filled 2020-02-15 (×5): qty 1

## 2020-02-15 MED ORDER — POTASSIUM CHLORIDE 10 MEQ/100ML IV SOLN
10.0000 meq | INTRAVENOUS | Status: DC
  Administered 2020-02-15: 10 meq via INTRAVENOUS
  Filled 2020-02-15: qty 100

## 2020-02-15 MED ORDER — FUROSEMIDE 10 MG/ML IJ SOLN
60.0000 mg | Freq: Three times a day (TID) | INTRAMUSCULAR | Status: DC
  Administered 2020-02-15 – 2020-02-20 (×14): 60 mg via INTRAVENOUS
  Filled 2020-02-15 (×14): qty 6

## 2020-02-15 MED ORDER — MAGNESIUM SULFATE 2 GM/50ML IV SOLN
2.0000 g | Freq: Once | INTRAVENOUS | Status: AC
  Administered 2020-02-15: 18:00:00 2 g via INTRAVENOUS
  Filled 2020-02-15: qty 50

## 2020-02-15 MED ORDER — NICOTINE 21 MG/24HR TD PT24
21.0000 mg | MEDICATED_PATCH | Freq: Once | TRANSDERMAL | Status: AC
  Administered 2020-02-15: 18:00:00 21 mg via TRANSDERMAL
  Filled 2020-02-15: qty 1

## 2020-02-15 MED ORDER — MOMETASONE FURO-FORMOTEROL FUM 200-5 MCG/ACT IN AERO
2.0000 | INHALATION_SPRAY | Freq: Two times a day (BID) | RESPIRATORY_TRACT | Status: DC
  Administered 2020-02-16 – 2020-02-21 (×11): 2 via RESPIRATORY_TRACT
  Filled 2020-02-15 (×2): qty 8.8

## 2020-02-15 MED ORDER — SODIUM CHLORIDE 0.9 % IV SOLN: 250.0000 mL | INTRAVENOUS | Status: DC | PRN

## 2020-02-15 MED ORDER — ACETAMINOPHEN 325 MG PO TABS: 650.0000 mg | ORAL_TABLET | Freq: Four times a day (QID) | ORAL | Status: DC | PRN

## 2020-02-15 MED ORDER — SODIUM CHLORIDE 0.9% FLUSH
3.0000 mL | Freq: Two times a day (BID) | INTRAVENOUS | Status: DC
  Administered 2020-02-15 – 2020-02-21 (×12): 3 mL via INTRAVENOUS

## 2020-02-15 MED ORDER — POTASSIUM CHLORIDE 10 MEQ/100ML IV SOLN
10.0000 meq | Freq: Once | INTRAVENOUS | Status: AC
  Administered 2020-02-15: 18:00:00 10 meq via INTRAVENOUS

## 2020-02-15 MED ORDER — POTASSIUM CHLORIDE CRYS ER 20 MEQ PO TBCR
40.0000 meq | EXTENDED_RELEASE_TABLET | Freq: Once | ORAL | Status: AC
  Administered 2020-02-15: 18:00:00 40 meq via ORAL
  Filled 2020-02-15: qty 2

## 2020-02-15 MED ORDER — ONDANSETRON HCL 4 MG/2ML IJ SOLN: 4.0000 mg | Freq: Four times a day (QID) | INTRAMUSCULAR | Status: DC | PRN

## 2020-02-15 MED ORDER — SODIUM CHLORIDE 0.9% FLUSH: 3.0000 mL | INTRAVENOUS | Status: DC | PRN

## 2020-02-15 MED ORDER — GABAPENTIN 300 MG PO CAPS
300.0000 mg | ORAL_CAPSULE | Freq: Two times a day (BID) | ORAL | Status: DC
  Administered 2020-02-15 – 2020-02-21 (×12): 300 mg via ORAL
  Filled 2020-02-15 (×12): qty 1

## 2020-02-15 MED ORDER — POLYETHYLENE GLYCOL 3350 17 G PO PACK
17.0000 g | PACK | Freq: Every day | ORAL | Status: DC | PRN
  Administered 2020-02-15: 17 g via ORAL
  Filled 2020-02-15: qty 1

## 2020-02-15 MED ORDER — POTASSIUM CHLORIDE 10 MEQ/100ML IV SOLN
INTRAVENOUS | Status: AC
  Administered 2020-02-15: 22:00:00 10 meq via INTRAVENOUS
  Filled 2020-02-15: qty 100

## 2020-02-15 MED ORDER — TRAZODONE HCL 50 MG PO TABS
100.0000 mg | ORAL_TABLET | Freq: Every day | ORAL | Status: DC
  Administered 2020-02-15 – 2020-02-20 (×6): 100 mg via ORAL
  Filled 2020-02-15 (×6): qty 2

## 2020-02-15 MED ORDER — INSULIN ASPART 100 UNIT/ML ~~LOC~~ SOLN
0.0000 [IU] | Freq: Every day | SUBCUTANEOUS | Status: DC
  Administered 2020-02-20: 23:00:00 2 [IU] via SUBCUTANEOUS

## 2020-02-15 MED ORDER — INSULIN ASPART 100 UNIT/ML ~~LOC~~ SOLN
0.0000 [IU] | Freq: Three times a day (TID) | SUBCUTANEOUS | Status: DC
  Administered 2020-02-15: 18:00:00 1 [IU] via SUBCUTANEOUS
  Administered 2020-02-16: 17:00:00 2 [IU] via SUBCUTANEOUS
  Administered 2020-02-16 – 2020-02-19 (×5): 1 [IU] via SUBCUTANEOUS
  Administered 2020-02-20: 3 [IU] via SUBCUTANEOUS
  Administered 2020-02-20 – 2020-02-21 (×3): 2 [IU] via SUBCUTANEOUS
  Administered 2020-02-21: 3 [IU] via SUBCUTANEOUS
  Filled 2020-02-15: qty 1

## 2020-02-15 MED ORDER — ALBUTEROL SULFATE HFA 108 (90 BASE) MCG/ACT IN AERS: 2.0000 | INHALATION_SPRAY | RESPIRATORY_TRACT | Status: DC | PRN

## 2020-02-15 MED ORDER — POTASSIUM CHLORIDE CRYS ER 20 MEQ PO TBCR
40.0000 meq | EXTENDED_RELEASE_TABLET | ORAL | Status: AC
  Administered 2020-02-15: 18:00:00 40 meq via ORAL
  Filled 2020-02-15 (×2): qty 2

## 2020-02-15 MED ORDER — HYDROCODONE-ACETAMINOPHEN 7.5-325 MG PO TABS
1.0000 | ORAL_TABLET | Freq: Four times a day (QID) | ORAL | Status: DC | PRN
  Administered 2020-02-15 – 2020-02-21 (×9): 1 via ORAL
  Filled 2020-02-15 (×9): qty 1

## 2020-02-15 MED ORDER — MOMETASONE FURO-FORMOTEROL FUM 200-5 MCG/ACT IN AERO: 2.0000 | INHALATION_SPRAY | Freq: Two times a day (BID) | RESPIRATORY_TRACT | Status: DC

## 2020-02-15 MED ORDER — ACETAMINOPHEN 650 MG RE SUPP: 650.0000 mg | Freq: Four times a day (QID) | RECTAL | Status: DC | PRN

## 2020-02-15 MED ORDER — FUROSEMIDE 10 MG/ML IJ SOLN
80.0000 mg | Freq: Once | INTRAMUSCULAR | Status: AC
  Administered 2020-02-15: 13:00:00 80 mg via INTRAVENOUS
  Filled 2020-02-15: qty 8

## 2020-02-15 NOTE — H&P (Signed)
Patient Demographics:    Tracy [name], is a 59 y.o. female  MRN: [id number]   DOB - November 29, 1961  Admit Date - 02/15/2020  Outpatient Primary MD for the patient is Rory Percy, MD   Assessment & Plan:    Principal Problem:   Acute on chronic heart failure with preserved ejection fraction (HFpEF) ([place]) Active Problems:   Acute exacerbation of CHF (congestive heart failure) ([place])   Tobacco abuse   Noncompliance   Essential hypertension   Diabetes ([place])   Acute respiratory failure with hypoxia ([place])   H/O: CVA (cerebrovascular accident)/ 2013   Diabetes mellitus type 2, uncontrolled ([place])   Rt DVT (deep venous thrombosis) -2014   Anticoagulated on Coumadin    1)HFpEF--- acute on chronic diastolic dysfunction CHF partly due to noncompliance -More than 20 pound weight gain over the last couple weeks, now with anasarca -BNP from 02/14/2020 and 02/15/2020 elevated, chest x-ray from 02/14/2020 consistent with CHF -Urine output of 1.5 L after Lasix 80 mg IV x1 in the ED -Continue IV Lasix 60 mg every 8 hours -Troponin, TSH and echo requested -Daily weight and fluid input and output monitoring -Monitor renal function and electrolytes closely with aggressive diuresis  2) COPD/tobacco abuse--patient smokes more than 1 pack a day, give nicotine patch, mucolytics and bronchodilators as advised -No significant COPD exacerbation at this time, hold off on steroids  3) history of medication noncompliance and prior history of polysubstance abuse--- UDS negative today, as per patient's daughter [name], no ongoing drug use -Patient was seen at [facility] in [place] on 02/08/2020, admission was advised and patient left AMA -Patient was seen here at [facility] on 02/14/2020 she declined admission and left AMA -Patient returned  02/15/2020--attempted to leave AMA again patient's daughter Alyse Low had to repeatedly begged patient to stay this time to be treated  4) acute hypoxic respiratory failure--- requiring 2 to 3 L of oxygen at this time, suspect mostly due to #1 above -Treat as above in #1 and #2  5)AKI----acute kidney injury -worsening renal function most likely due to poor renal perfusion on the setting of worsening CHF  --- creatinine on admission= 2.0, baseline creatinine = 1.1     renally adjust medications, avoid nephrotoxic agents / dehydration  / hypotension -Hold lisinopril HCTZ, -Hold Metformin, decrease gabapentin to twice daily from 3 times daily dosing  6)DM2-previously uncontrolled, hold Jardiance, hold Metformin, Use Novolog/Humalog Sliding scale insulin with Accu-Cheks/Fingersticks as ordered   7)Chronic Anticoagulation--- patient is chronically on Coumadin INR currently 3.1, Patient with history of prior CVA with right hemiparesis as well as history of prior right lower extremity DVT (2014)  8) history of CVA in 2013--PTA patient was not compliant with atorvastatin, restart atorvastatin patient already on Coumadin as above  9) hypokalemia--potassium 2.6 in the setting of HCTZ and Lasix use, replace potassium, check magnesium  10) elevated bilirubin and alk phos--- get liver ultrasound  With History of - Reviewed  by me  Past Medical History:  Diagnosis Date  . Acute ischemic stroke ([place]) 07/03/2012  . Crack cocaine use   . Diabetes ([place])   . Diabetes mellitus ([place])   . DVT (deep venous thrombosis) ([place])   . Hemiparesis, acute ([place]) 07/04/2012  . Hypertension   . LVH (left ventricular hypertrophy) 07/04/2012   Ejection fraction 60%.  . Myocardial infarct, old   . Thrombocytopenia ([place]) 07/03/2012  . Tobacco abuse       Past Surgical History:  Procedure Laterality Date  . ABDOMINAL HYSTERECTOMY    . HERNIA REPAIR        Chief Complaint  Patient presents with  . Leg Swelling  .  Shortness of Breath  . abdominal swelling      HPI:    Tracy [name]  is a 59 y.o. female past medical history relevant for medication lifestyle noncompliance, tobacco abuse, cocaine use (in remission), HTN, DM 2, COPD, history of prior stroke, history of right lower extremity DVT on chronic anticoagulation, HFpEF and morbid obesity who presents to the ED with worsening shortness of breath and 20 pound weight gain over the last couple of weeks -Denies chest pains, denies pleuritic symptoms -Patient has significant lower extremity and abdominal wall swelling/anasarca -No fever no chills, no nausea no vomiting or diarrhea, -- Patient was seen at [facility] in [place] on 02/08/2020, admission was advised and patient left AMA -Patient was seen here at [place] on 02/14/2020 she declined admission and left AMA -Patient returned 02/15/2020--attempted to leave AMA again patient's daughter Alyse Low had to repeatedly begged patient to stay this time to be treated  -I spoke with patient's daughter and got additional history  -BNP from 02/14/2020 and 02/15/2020 elevated, chest x-ray from 02/14/2020 consistent with CHF  -Patient received IV Lasix 80 mg x 1 in the ED with good urine output,  Elevated creatinine noted   Review of systems:    In addition to the HPI above,   A full Review of  Systems was done, all other systems reviewed are negative except as noted above in HPI , .    Social History:  Reviewed by me    Social History   Tobacco Use  . Smoking status: Current Every Day Smoker    Packs/day: 1.00    Types: Cigarettes  . Smokeless tobacco: Never Used  Substance Use Topics  . Alcohol use: No    Family History :  Reviewed by me    Family History  Problem Relation Age of Onset  . Heart disease Mother   . Diabetes Mother   . Stroke Mother   . Hypertension Mother   . Hypertension Father   . Heart disease Father   . Diabetes Brother      Home Medications:   Prior to  Admission medications   Medication Sig Start Date End Date Taking? Authorizing Provider  albuterol (PROVENTIL HFA;VENTOLIN HFA) 108 (90 BASE) MCG/ACT inhaler Inhale 2 puffs into the lungs every 4 (four) hours as needed for wheezing or shortness of breath. 07/09/13  Yes Samuella Cota, MD  budesonide-formoterol [facility]) 160-4.5 MCG/ACT inhaler Inhale 2 puffs into the lungs 2 (two) times daily.   Yes [provider]  furosemide (LASIX) 20 MG tablet Take 20 mg by mouth 2 (two) times daily. 10/21/19  Yes [provider]  gabapentin (NEURONTIN) 300 MG capsule Take 300 mg by mouth 3 (three) times daily.   Yes [provider]  hydrALAZINE (APRESOLINE) 25 MG tablet  Take 1 tablet (25 mg total) by mouth every 8 (eight) hours. Patient taking differently: Take 25 mg by mouth daily.  03/25/15  Yes Kathie Dike, MD  HYDROcodone-acetaminophen (NORCO) 7.5-325 MG tablet Take 1 tablet by mouth every 6 (six) hours as needed for moderate pain (Must last 30 days). 02/01/20  Yes Sanjuana Kava, MD  JARDIANCE 25 MG TABS tablet Take 25 mg by mouth daily.  01/02/18  Yes [provider]  lisinopril-hydrochlorothiazide (PRINZIDE,ZESTORETIC) 20-12.5 MG per tablet Take 2 tablets by mouth daily.   Yes [provider]  metFORMIN (GLUCOPHAGE-XR) 500 MG 24 hr tablet Take 1,000 mg by mouth 2 (two) times a day.    Yes [provider]  warfarin (COUMADIN) 5 MG tablet Take 5 mg by mouth every morning. as directed 05/26/18  Yes [provider]  atorvastatin (LIPITOR) 10 MG tablet Take 10 mg by mouth daily at 6 PM.  06/06/19   [provider]  furosemide (LASIX) 80 MG tablet Take 80 mg by mouth 2 (two) times daily. 7 day course starting on 02/11/2020    [provider]  potassium chloride SA (KLOR-CON) 20 MEQ tablet Take 1 tablet (20 mEq total) by mouth 3 (three) times daily. Patient not taking: Reported on 02/15/2020 02/14/20   Petrucelli, Glynda Jaeger, PA-C     Allergies:    No Known Allergies   Physical Exam:   Vitals  Blood pressure 119/81, pulse 94, temperature 97.6 F (36.4 C), temperature source Oral, resp. rate 17, height '5\' 4"'  (1.626 m), weight 117 kg, SpO2 98 %.  Physical Examination: General appearance - alert, obese appearing, and in respiratory distress, unable to speak in complete sentences mental status - alert, oriented to person, place, and time,  Eyes - sclera anicteric Nose- [place] 2L/min Neck - supple, no JVD elevation , Chest -diminished breath sounds with bibasilar rales Heart - S1 and S2 normal, regular  Abdomen - soft, nontender, nondistended, anterior abdominal wall edema  neurological - screening mental status exam normal, neck supple without rigidity, cranial nerves II through XII intact, DTR's normal and symmetric Extremities -3+ pitting pedal edema noted, intact peripheral pulses  Skin - warm, dry     Data Review:    CBC Recent Labs  Lab 02/14/20 1709 02/15/20 1312  WBC 4.6 5.3  HGB 11.6* 11.2*  HCT 40.2 39.1  PLT 108* 100*  MCV 94.8 96.1  MCH 27.4 27.5  MCHC 28.9* 28.6*  RDW 20.5* 20.8*  LYMPHSABS 1.1  --   MONOABS 0.3  --   EOSABS 0.0  --   BASOSABS 0.0  --    ------------------------------------------------------------------------------------------------------------------  Chemistries  Recent Labs  Lab 02/14/20 1709 02/15/20 1312  NA 141 142  K 2.7* 2.6*  CL 103 105  CO2 24 24  GLUCOSE 156* 116*  BUN 36* 35*  CREATININE 2.02* 2.02*  CALCIUM 7.7* 7.6*  AST 30 31  ALT 29 30  ALKPHOS 196* 193*  BILITOT 2.2* 2.3*   ------------------------------------------------------------------------------------------------------------------ estimated creatinine clearance is 37.7 mL/min (A) (by C-G formula based on SCr of 2.02 mg/dL (H)). ------------------------------------------------------------------------------------------------------------------ No results for input(s): TSH, T4TOTAL,  T3FREE, THYROIDAB in the last 72 hours.  Invalid input(s): FREET3   Coagulation profile Recent Labs  Lab 02/14/20 1709 02/15/20 1312  INR 2.9* 3.1*   ------------------------------------------------------------------------------------------------------------------- No results for input(s): DDIMER in the last 72 hours. -------------------------------------------------------------------------------------------------------------------  Cardiac Enzymes No results for input(s): CKMB, TROPONINI, MYOGLOBIN in the last 168 hours.  Invalid input(s): CK ------------------------------------------------------------------------------------------------------------------  Component Value Date/Time   BNP 454.0 (H) 02/15/2020 1312     ---------------------------------------------------------------------------------------------------------------  Urinalysis    Component Value Date/Time   COLORURINE AMBER (A) 08/03/2013 2205   APPEARANCEUR HAZY (A) 08/03/2013 2205   LABSPEC >1.030 (H) 08/03/2013 2205   PHURINE 5.5 08/03/2013 2205   GLUCOSEU NEGATIVE 08/03/2013 2205   HGBUR NEGATIVE 08/03/2013 2205   BILIRUBINUR MODERATE (A) 08/03/2013 2205   KETONESUR TRACE (A) 08/03/2013 2205   PROTEINUR NEGATIVE 08/03/2013 2205   UROBILINOGEN 0.2 08/03/2013 2205   NITRITE NEGATIVE 08/03/2013 2205   LEUKOCYTESUR NEGATIVE 08/03/2013 2205    ----------------------------------------------------------------------------------------------------------------   Imaging Results:    DG Chest Port 1 View  Result Date: 02/14/2020 CLINICAL DATA:  Shortness of breath and fluid retention. EXAM: PORTABLE CHEST 1 VIEW COMPARISON:  PA and lateral chest 02/08/2020 and 05/20/2019. FINDINGS: There is marked cardiomegaly and pulmonary vascular congestion. No consolidative process, pneumothorax or effusion. Atherosclerosis is noted. No acute or focal bony abnormality. IMPRESSION: Cardiomegaly and vascular congestion.  Atherosclerosis. Electronically Signed   By: Inge Rise M.D.   On: 02/14/2020 17:45    Radiological Exams on Admission: DG Chest Port 1 View  Result Date: 02/14/2020 CLINICAL DATA:  Shortness of breath and fluid retention. EXAM: PORTABLE CHEST 1 VIEW COMPARISON:  PA and lateral chest 02/08/2020 and 05/20/2019. FINDINGS: There is marked cardiomegaly and pulmonary vascular congestion. No consolidative process, pneumothorax or effusion. Atherosclerosis is noted. No acute or focal bony abnormality. IMPRESSION: Cardiomegaly and vascular congestion. Atherosclerosis. Electronically Signed   By: Inge Rise M.D.   On: 02/14/2020 17:45    DVT Prophylaxis -SCD/Coumadin AM Labs Ordered, also please review Full Orders  Family Communication: Admission, patients condition and plan of care including tests being ordered have been discussed with the patient and daughter Alyse Low who indicate understanding and agree with the plan   Code Status - Full Code  Likely DC to  Home after adequate diuresis and stabilization of renal function  Condition   stable  Roxan Hockey M.D on 02/15/2020 at 5:52 PM Go to [email or web address] -  for contact info  [facility] - Office  [phone]

## 2020-02-15 NOTE — ED Provider Notes (Signed)
[facility] Emergency Department Provider Note MRN:  [id number]  Arrival date & time: 02/15/20     Chief Complaint   Leg Swelling, Shortness of Breath, and abdominal swelling   History of Present Illness   Tracy [name] is a 59 y.o. year-old female with a history of diabetes, hypertension, cocaine abuse presenting to the ED with chief complaint of shortness of breath.  1 month of progressively worsening shortness of breath with edema to the legs and now of the abdomen.  Denies chest pain.  No fever, no cough.  Symptoms constant, moderate to severe, no exacerbating or relieving factors.  Having some trouble getting around because of the swelling.  Review of Systems  A complete 10 system review of systems was obtained and all systems are negative except as noted in the HPI and PMH.   Patient's Health History    Past Medical History:  Diagnosis Date  . Acute ischemic stroke ([place]) 07/03/2012  . Crack cocaine use   . Diabetes ([place])   . Diabetes mellitus ([place])   . DVT (deep venous thrombosis) ([place])   . Hemiparesis, acute ([place]) 07/04/2012  . Hypertension   . LVH (left ventricular hypertrophy) 07/04/2012   Ejection fraction 60%.  . Myocardial infarct, old   . Thrombocytopenia ([place]) 07/03/2012  . Tobacco abuse     Past Surgical History:  Procedure Laterality Date  . ABDOMINAL HYSTERECTOMY    . HERNIA REPAIR      Family History  Problem Relation Age of Onset  . Heart disease Mother   . Diabetes Mother   . Stroke Mother   . Hypertension Mother   . Hypertension Father   . Heart disease Father   . Diabetes Brother     Social History   Socioeconomic History  . Marital status: Widowed    Spouse name: Not on file  . Number of children: Not on file  . Years of education: Not on file  . Highest education level: Not on file  Occupational History  . Not on file  Tobacco Use  . Smoking status: Current Every Day Smoker    Packs/day: 1.00    Types:  Cigarettes  . Smokeless tobacco: Never Used  Substance and Sexual Activity  . Alcohol use: No  . Drug use: No    Comment: last used 2 years ago  . Sexual activity: Never    Birth control/protection: Surgical  Other Topics Concern  . Not on file  Social History Narrative  . Not on file   Social Determinants of Health   Financial Resource Strain:   . Difficulty of Paying Living Expenses: Not on file  Food Insecurity:   . Worried About Charity fundraiser in the Last Year: Not on file  . Ran Out of Food in the Last Year: Not on file  Transportation Needs:   . Lack of Transportation (Medical): Not on file  . Lack of Transportation (Non-Medical): Not on file  Physical Activity:   . Days of Exercise per Week: Not on file  . Minutes of Exercise per Session: Not on file  Stress:   . Feeling of Stress : Not on file  Social Connections:   . Frequency of Communication with Friends and Family: Not on file  . Frequency of Social Gatherings with Friends and Family: Not on file  . Attends Religious Services: Not on file  . Active Member of Clubs or Organizations: Not on file  . Attends Archivist  Meetings: Not on file  . Marital Status: Not on file  Intimate Partner Violence:   . Fear of Current or Ex-Partner: Not on file  . Emotionally Abused: Not on file  . Physically Abused: Not on file  . Sexually Abused: Not on file     Physical Exam   Vitals:   02/15/20 1300 02/15/20 1330  BP: 118/89 (!) 120/100  Pulse:  (!) 102  Resp: (!) 24 18  Temp:    SpO2:  99%    CONSTITUTIONAL: Chronically ill-appearing, NAD NEURO:  Alert and oriented x 3, no focal deficits EYES:  eyes equal and reactive ENT/NECK:  no LAD, no JVD CARDIO: Tachycardic rate, well-perfused, normal S1 and S2 PULM:  CTAB no wheezing or rhonchi GI/GU:  normal bowel sounds, non-distended, non-tender MSK/SPINE:  No gross deformities, anasarca to the legs and abdomen SKIN:  no rash, atraumatic PSYCH:   Appropriate speech and behavior  *Additional and/or pertinent findings included in MDM below  Diagnostic and Interventional Summary    EKG Interpretation  Date/Time:  Tuesday February 15 2020 11:34:39 EST Ventricular Rate:  121 PR Interval:    QRS Duration: 158 QT Interval:  364 QTC Calculation: 517 R Axis:   -28 Text Interpretation: Atrial fibrillation Right bundle branch block Inferior infarct, old Confirmed by Gerlene Fee [phone]) on 02/15/2020 1:16:24 PM      Cardiac Monitoring Interpretation:  Labs Reviewed  CBC - Abnormal; Notable for the following components:      Result Value   Hemoglobin 11.2 (*)    MCHC 28.6 (*)    RDW 20.8 (*)    Platelets 100 (*)    nRBC 0.4 (*)    All other components within normal limits  COMPREHENSIVE METABOLIC PANEL - Abnormal; Notable for the following components:   Potassium 2.6 (*)    Glucose, Bld 116 (*)    BUN 35 (*)    Creatinine, Ser 2.02 (*)    Calcium 7.6 (*)    Total Protein 6.0 (*)    Albumin 3.0 (*)    Alkaline Phosphatase 193 (*)    Total Bilirubin 2.3 (*)    GFR calc non Af Amer 26 (*)    GFR calc Af Amer 31 (*)    All other components within normal limits  BRAIN NATRIURETIC PEPTIDE - Abnormal; Notable for the following components:   B Natriuretic Peptide 454.0 (*)    All other components within normal limits  PROTIME-INR - Abnormal; Notable for the following components:   Prothrombin Time 32.0 (*)    INR 3.1 (*)    All other components within normal limits  SARS CORONAVIRUS 2 (TAT 6-24 HRS)  HEMOGLOBIN A1C  MAGNESIUM    No orders to display    Medications  potassium chloride 10 mEq in 100 mL IVPB (has no administration in time range)  potassium chloride SA (KLOR-CON) CR tablet 40 mEq (has no administration in time range)  magnesium sulfate IVPB 2 g 50 mL (has no administration in time range)  insulin aspart (novoLOG) injection 0-9 Units (has no administration in time range)  insulin aspart (novoLOG) injection  0-5 Units (has no administration in time range)  potassium chloride SA (KLOR-CON) CR tablet 40 mEq (has no administration in time range)  furosemide (LASIX) injection 80 mg (80 mg Intravenous Given 02/15/20 1237)     Procedures  /  Critical Care .Critical Care Performed by: Maudie Flakes, MD Authorized by: Maudie Flakes, MD   Critical care provider  statement:    Critical care time (minutes):  33   Critical care was necessary to treat or prevent imminent or life-threatening deterioration of the following conditions:  Metabolic crisis (Critical hypokalemia)   Critical care was time spent personally by me on the following activities:  Discussions with consultants, evaluation of patient's response to treatment, examination of patient, ordering and performing treatments and interventions, ordering and review of laboratory studies, ordering and review of radiographic studies, pulse oximetry, re-evaluation of patient's condition, obtaining history from patient or surrogate and review of old charts    ED Course and Medical Decision Making  I have reviewed the triage vital signs, the nursing notes, and pertinent available records from the EMR.  Pertinent labs & imaging results that were available during my care of the patient were reviewed by me and considered in my medical decision making (see below for details).     Concern for CHF exacerbation causing anasarca.  Will need admission.  2:24 PM update: Work-up reveals elevated BNP, potassium of 2.6.  Admitted to hospital service for further care.  Barth Kirks. Sedonia Small, MD [address] [email or web address]  Final Clinical Impressions(s) / ED Diagnoses     ICD-10-CM   1. Anasarca  R60.1   2. Hypokalemia  E87.6     ED Discharge Orders    None       Discharge Instructions Discussed with and Provided to Patient:   Discharge Instructions   None       Maudie Flakes, MD 02/15/20 1425

## 2020-02-15 NOTE — ED Triage Notes (Signed)
Pt returned after leaving AMA last night Pt has swelling in abdomen and legs, plus SOB. Pt reports this has been occurring x 2 weeks

## 2020-02-15 NOTE — Progress Notes (Signed)
Per RN patient refusing everything.

## 2020-02-15 NOTE — ED Notes (Signed)
Date and time results received: 02/15/20 1:53 PM  (use smartphrase ".now" to insert current time)  Test: postassium Critical Value: 2.6  Name of Provider Notified: Bero  Orders Received? Or Actions Taken?: Orders Received - See Orders for details

## 2020-02-15 NOTE — ED Notes (Signed)
Patient states that she no longer wants to stay. I explained to the patient the need to stay and be treated for afib and increased edema. Informed patient of the danger of leaving AMA and dying at home from stroke or MI. Patient still wants to leave AMA.

## 2020-02-16 ENCOUNTER — Inpatient Hospital Stay (HOSPITAL_COMMUNITY): Payer: Medicare Other

## 2020-02-16 DIAGNOSIS — I340 Nonrheumatic mitral (valve) insufficiency: Secondary | ICD-10-CM

## 2020-02-16 DIAGNOSIS — I361 Nonrheumatic tricuspid (valve) insufficiency: Secondary | ICD-10-CM

## 2020-02-16 DIAGNOSIS — I480 Paroxysmal atrial fibrillation: Secondary | ICD-10-CM | POA: Diagnosis present

## 2020-02-16 LAB — CBC
HCT: 39.8 % (ref 36.0–46.0)
Hemoglobin: 11.3 g/dL — ABNORMAL LOW (ref 12.0–15.0)
MCH: 27.1 pg (ref 26.0–34.0)
MCHC: 28.4 g/dL — ABNORMAL LOW (ref 30.0–36.0)
MCV: 95.4 fL (ref 80.0–100.0)
Platelets: 87 10*3/uL — ABNORMAL LOW (ref 150–400)
RBC: 4.17 MIL/uL (ref 3.87–5.11)
RDW: 20.6 % — ABNORMAL HIGH (ref 11.5–15.5)
WBC: 5.3 10*3/uL (ref 4.0–10.5)
nRBC: 0 % (ref 0.0–0.2)

## 2020-02-16 LAB — COMPREHENSIVE METABOLIC PANEL
ALT: 28 U/L (ref 0–44)
AST: 30 U/L (ref 15–41)
Albumin: 3 g/dL — ABNORMAL LOW (ref 3.5–5.0)
Alkaline Phosphatase: 194 U/L — ABNORMAL HIGH (ref 38–126)
Anion gap: 11 (ref 5–15)
BUN: 33 mg/dL — ABNORMAL HIGH (ref 6–20)
CO2: 25 mmol/L (ref 22–32)
Calcium: 8 mg/dL — ABNORMAL LOW (ref 8.9–10.3)
Chloride: 105 mmol/L (ref 98–111)
Creatinine, Ser: 1.71 mg/dL — ABNORMAL HIGH (ref 0.44–1.00)
GFR calc Af Amer: 37 mL/min — ABNORMAL LOW (ref >60)
GFR calc non Af Amer: 32 mL/min — ABNORMAL LOW (ref >60)
Glucose, Bld: 111 mg/dL — ABNORMAL HIGH (ref 70–99)
Potassium: 3.4 mmol/L — ABNORMAL LOW (ref 3.5–5.1)
Sodium: 141 mmol/L (ref 135–145)
Total Bilirubin: 2.5 mg/dL — ABNORMAL HIGH (ref 0.3–1.2)
Total Protein: 5.8 g/dL — ABNORMAL LOW (ref 6.5–8.1)

## 2020-02-16 LAB — ECHOCARDIOGRAM COMPLETE
Height: 66 in
Weight: 4257.52 oz

## 2020-02-16 LAB — GLUCOSE, CAPILLARY
Glucose-Capillary: 110 mg/dL — ABNORMAL HIGH (ref 70–99)
Glucose-Capillary: 128 mg/dL — ABNORMAL HIGH (ref 70–99)
Glucose-Capillary: 155 mg/dL — ABNORMAL HIGH (ref 70–99)
Glucose-Capillary: 126 mg/dL — ABNORMAL HIGH (ref 70–99)

## 2020-02-16 LAB — MRSA PCR SCREENING: MRSA by PCR: NEGATIVE

## 2020-02-16 LAB — HIV ANTIBODY (ROUTINE TESTING W REFLEX): HIV Screen 4th Generation wRfx: NONREACTIVE

## 2020-02-16 MED ORDER — MAGNESIUM SULFATE 2 GM/50ML IV SOLN
2.0000 g | Freq: Once | INTRAVENOUS | Status: AC
  Administered 2020-02-16: 2 g via INTRAVENOUS
  Filled 2020-02-16: qty 50

## 2020-02-16 MED ORDER — POTASSIUM CHLORIDE CRYS ER 20 MEQ PO TBCR
40.0000 meq | EXTENDED_RELEASE_TABLET | ORAL | Status: AC
  Administered 2020-02-16 (×2): 40 meq via ORAL
  Filled 2020-02-16 (×2): qty 2

## 2020-02-16 MED ORDER — CHLORHEXIDINE GLUCONATE CLOTH 2 % EX PADS
6.0000 | MEDICATED_PAD | Freq: Every day | CUTANEOUS | Status: DC
  Administered 2020-02-16 – 2020-02-21 (×6): 6 via TOPICAL

## 2020-02-16 MED ORDER — ALBUTEROL SULFATE (2.5 MG/3ML) 0.083% IN NEBU: 2.5000 mg | INHALATION_SOLUTION | RESPIRATORY_TRACT | Status: DC | PRN

## 2020-02-16 MED ORDER — METOPROLOL TARTRATE 25 MG PO TABS
25.0000 mg | ORAL_TABLET | Freq: Two times a day (BID) | ORAL | Status: DC
  Administered 2020-02-16 – 2020-02-21 (×10): 25 mg via ORAL
  Filled 2020-02-16 (×10): qty 1

## 2020-02-16 MED ORDER — ORAL CARE MOUTH RINSE
15.0000 mL | Freq: Two times a day (BID) | OROMUCOSAL | Status: DC
  Administered 2020-02-16 – 2020-02-21 (×10): 15 mL via OROMUCOSAL

## 2020-02-16 MED ORDER — ALBUTEROL SULFATE (2.5 MG/3ML) 0.083% IN NEBU
2.5000 mg | INHALATION_SOLUTION | RESPIRATORY_TRACT | Status: DC | PRN
  Administered 2020-02-16 – 2020-02-17 (×3): 2.5 mg via RESPIRATORY_TRACT
  Filled 2020-02-16 (×3): qty 3

## 2020-02-16 NOTE — Progress Notes (Signed)
*  PRELIMINARY RESULTS* Echocardiogram 2D Echocardiogram has been performed.  Leavy Cella 02/16/2020, 3:52 PM

## 2020-02-16 NOTE — Evaluation (Signed)
Physical Therapy Evaluation Patient Details Name: Tracy [name] MRN: [id number] DOB: 1961/11/17 Today's Date: 02/16/2020   History of Present Illness  Tracy [name]  is a 59 y.o. female past medical history relevant for medication lifestyle noncompliance, tobacco abuse, cocaine use (in remission), HTN, DM 2, COPD, history of prior stroke, history of right lower extremity DVT on chronic anticoagulation, HFpEF and morbid obesity who presents to the ED with worsening shortness of breath and 20 pound weight gain over the last couple of weeks    Clinical Impression  Patient functioning near baseline for functional mobility and gait, required much time and frequent rest breaks to sit up at bedside, labored slow cadence without loss of balance during ambulation while on 2 LPM O2 with SpO2 at 90-92%, limited secondary to c/o fatigue and tolerated sitting up in chair after therapy - nursing staff aware.  Patient will benefit from continued physical therapy in hospital and recommended venue below to increase strength, balance, endurance for safe ADLs and gait.     Follow Up Recommendations Home health PT;Supervision for mobility/OOB;Supervision - Intermittent    Equipment Recommendations  None recommended by PT    Recommendations for Other Services       Precautions / Restrictions Precautions Precautions: Fall Restrictions Weight Bearing Restrictions: No      Mobility  Bed Mobility Overal bed mobility: Needs Assistance Bed Mobility: Supine to Sit     Supine to sit: Supervision     General bed mobility comments: labored movement, increased time  Transfers Overall transfer level: Needs assistance Equipment used: Rolling walker (2 wheeled) Transfers: Sit to/from [facility] Sit to Stand: Supervision;Min guard Stand pivot transfers: Supervision;Min guard       General transfer comment: increased time labored movement  Ambulation/Gait Ambulation/Gait  assistance: Supervision;Min guard Gait Distance (Feet): 60 Feet Assistive device: Rolling walker (2 wheeled) Gait Pattern/deviations: Decreased step length - left;Decreased step length - right;Decreased stride length Gait velocity: decreased   General Gait Details: slightly labored slow cadence without loss of balance, limited secondary to fatigue, on 2 LPM with SpO2 at 90-92%  Stairs            Wheelchair Mobility    Modified Rankin (Stroke Patients Only)       Balance Overall balance assessment: Needs assistance Sitting-balance support: Feet supported;No upper extremity supported Sitting balance-Leahy Scale: Good Sitting balance - Comments: seated at EOB   Standing balance support: During functional activity;Bilateral upper extremity supported Standing balance-Leahy Scale: Fair Standing balance comment: using RW                             Pertinent Vitals/Pain Pain Assessment: No/denies pain    Home Living Family/patient expects to be discharged to:: Private residence Living Arrangements: Alone Available Help at Discharge: Family;Available PRN/intermittently Type of Home: House Home Access: Stairs to enter Entrance Stairs-Rails: Right;Left;Can reach both Entrance Stairs-Number of Steps: 3 Home Layout: One level Home Equipment: Cane - quad;Walker - 4 wheels;Shower seat      Prior Function Level of Independence: Independent with assistive device(s)         Comments: household and short distanced community ambulator with quad-cane     Hand Dominance   Dominant Hand: Left    Extremity/Trunk Assessment   Upper Extremity Assessment Upper Extremity Assessment: Overall WFL for tasks assessed    Lower Extremity Assessment Lower Extremity Assessment: Generalized weakness    Cervical / Trunk Assessment Cervical /  Trunk Assessment: Normal  Communication   Communication: No difficulties  Cognition Arousal/Alertness: Awake/alert Behavior  During Therapy: WFL for tasks assessed/performed Overall Cognitive Status: Within Functional Limits for tasks assessed                                        General Comments      Exercises     Assessment/Plan    PT Assessment Patient needs continued PT services  PT Problem List Decreased strength;Decreased activity tolerance;Decreased balance;Decreased mobility       PT Treatment Interventions Balance training;Gait training;Stair training;Functional mobility training;Therapeutic activities;Therapeutic exercise;Patient/family education    PT Goals (Current goals can be found in the Care Plan section)  Acute Rehab PT Goals Patient Stated Goal: return home with family to assist PT Goal Formulation: With patient Time For Goal Achievement: 02/23/20 Potential to Achieve Goals: Good    Frequency Min 3X/week   Barriers to discharge        Co-evaluation               AM-PAC PT "6 Clicks" Mobility  Outcome Measure Help needed turning from your back to your side while in a flat bed without using bedrails?: None Help needed moving from lying on your back to sitting on the side of a flat bed without using bedrails?: A Little Help needed moving to and from a bed to a chair (including a wheelchair)?: A Little Help needed standing up from a chair using your arms (e.g., wheelchair or bedside chair)?: A Little Help needed to walk in hospital room?: A Little Help needed climbing 3-5 steps with a railing? : A Lot 6 Click Score: 18    End of Session Equipment Utilized During Treatment: Oxygen Activity Tolerance: Patient tolerated treatment well;Patient limited by fatigue Patient left: in chair;with call bell/phone within reach Nurse Communication: Mobility status PT Visit Diagnosis: Unsteadiness on feet (R26.81);Other abnormalities of gait and mobility (R26.89);Muscle weakness (generalized) (M62.81)    Time: [id number] PT Time Calculation (min) (ACUTE ONLY): 31  min   Charges:   PT Evaluation $PT Eval Moderate Complexity: 1 Mod PT Treatments $Therapeutic Activity: 23-37 mins        12:29 PM, 02/16/20 Lonell Grandchild, MPT Physical Therapist with [facility] [phone] office [phone] mobile phone

## 2020-02-16 NOTE — Progress Notes (Signed)
Patient Demographics:    Tracy [name], is a 59 y.o. female, DOB - [birth year], [id number]  Admit date - 02/15/2020   Admitting Physician Oyindamola Key Denton Brick, MD  Outpatient Primary MD for the patient is Rory Percy, MD  LOS - 1   Chief Complaint  Patient presents with  . Leg Swelling  . Shortness of Breath  . abdominal swelling        Subjective:    Tracy [name] today has no fevers, no emesis,  No chest pain,  --Shortness of breath is not worse -Voiding well, no productive cough -Palpitations and episodes of tachyarrhythmia and irregular rhythm noted  Assessment  & Plan :    Principal Problem:   Acute on chronic heart failure with preserved ejection fraction (HFpEF) (HCC) Active Problems:   Acute exacerbation of CHF (congestive heart failure) (HCC)   Paroxysmal A-fib (HCC)   Tobacco abuse   Noncompliance   Essential hypertension   Diabetes ([place])   Acute respiratory failure with hypoxia (HCC)   H/O: CVA (cerebrovascular accident)/ 2013   Diabetes mellitus type 2, uncontrolled ([place])   Rt DVT (deep venous thrombosis) -2014   Anticoagulated on Coumadin   1)HFpEF--- acute on chronic diastolic dysfunction CHF partly due to noncompliance -EF 55 to 60%, -More than 20 pound weight gain over the last couple weeks, now with anasarca -BNP from 02/14/2020 and 02/15/2020 elevated, chest x-ray from 02/14/2020 consistent with CHF --Diuresing okay, shortness of breath and oxygen requirement is not worse -Continue IV Lasix 60 mg every 8 hours -Daily weight and fluid input and output monitoring -Monitor renal function and electrolytes closely with aggressive diuresis  2)PAFib--- patient with episodes of tachyarrhythmia / paroxysmal A. Fib--- already on Coumadin for anticoagulation, -TSH is 1.9, start metoprolol 25 mg twice daily for rate control  3) history of medication noncompliance  and prior history of polysubstance abuse--- UDS negative today, as per patient's daughter [name], no ongoing drug use -Patient was seen at [facility] in [place] on 02/08/2020, admission was advised and patient left AMA -Patient was seen here at [facility] on 02/14/2020 she declined admission and left AMA -Patient returned 02/15/2020--attempted to leave AMA again patient's daughter Alyse Low had to repeatedly begged patient to stay this time to be treated  4) acute hypoxic respiratory failure--- requiring 2 to 3 L of oxygen at this time, suspect mostly due to #1 above -Treat as above in #1 and #2  5)AKI----acute kidney injury -worsening renal function most likely due to poor renal perfusion on the setting of worsening CHF  --- creatinine on admission= 2.0, baseline creatinine = 1.1   -creatinine is down to 1.7, renally adjust medications, avoid nephrotoxic agents / dehydration  / hypotension -Hold lisinopril HCTZ, -Hold Metformin, decrease gabapentin to twice daily from 3 times daily   6)DM2-previously uncontrolled, hold Jardiance, hold Metformin, Use Novolog/Humalog Sliding scale insulin with Accu-Cheks/Fingersticks as ordered   7)Chronic Anticoagulation--- patient is chronically on Coumadin for prior DVT (2014), now found to have PAFib -Continue Coumadin  8) history of CVA in 2013--with prior right-sided hemiparesis, PTA patient was not compliant with atorvastatin, continue atorvastatin , continue   Coumadin as above  9) hypokalemia/hypomagnesemia--replaced on recheck, continue to hold HCTZ, -Patient needs Lasix for diuresis  10) elevated  bilirubin and alk phos--- liver ultrasound without acute abnormalities, T bili 2.5 alk phos 194 AST and ALT are not elevated -Outpatient GI follow-up advised  11)COPD/tobacco abuse--patient smokes more than 1 pack a day, continue nicotine patch, mucolytics and bronchodilators as advised -No significant COPD exacerbation at this time, hold off on steroids  12)  generalized weakness/deconditioning--PT eval appreciated recommends discharge home with home health  Disposition/Need for in-Hospital Stay- patient unable to be discharged at this time due to --- hypoxic respiratory failure due to CHF requiring further IV diuresis -Not medically ready for discharge home  Code Status : Full  Family Communication:     (patient is alert, awake and coherent) Discussed with daughter [name]  Consults  :  Na  DVT Prophylaxis  : Coumadin- SCDs   Lab Results  Component Value Date   PLT 87 (L) 02/16/2020    Inpatient Medications  Scheduled Meds: . atorvastatin  10 mg Oral q1800  . Chlorhexidine Gluconate Cloth  6 each Topical Daily  . furosemide  60 mg Intravenous Q8H  . gabapentin  300 mg Oral BID  . insulin aspart  0-5 Units Subcutaneous QHS  . insulin aspart  0-9 Units Subcutaneous TID WC  . mouth rinse  15 mL Mouth Rinse BID  . mometasone-formoterol  2 puff Inhalation BID  . potassium chloride SA  20 mEq Oral TID  . sodium chloride flush  3 mL Intravenous Q12H  . traZODone  100 mg Oral QHS   Continuous Infusions: . sodium chloride     PRN Meds:.sodium chloride, acetaminophen **OR** acetaminophen, albuterol, HYDROcodone-acetaminophen, ondansetron **OR** ondansetron (ZOFRAN) IV, polyethylene glycol, sodium chloride flush    Anti-infectives (From admission, onward)   None        Objective:   Vitals:   02/16/20 0845 02/16/20 1129 02/16/20 1604 02/16/20 1700  BP:      Pulse:  (!) 106 (!) 102 (!) 106  Resp:  (!) '21 17 15  ' Temp:  97.6 F (36.4 C) 97.7 F (36.5 C)   TempSrc:  Axillary Oral   SpO2: 95% 92% 93% 96%  Weight:      Height:        Wt Readings from Last 3 Encounters:  02/16/20 120.7 kg  02/14/20 117.9 kg  01/18/20 110.3 kg     Intake/Output Summary (Last 24 hours) at 02/16/2020 1902 Last data filed at 02/16/2020 1841 Gross per 24 hour  Intake 693.34 ml  Output 4150 ml  Net -3456.66 ml     Physical  Exam  Gen:- Awake Alert,speak in short sentences HEENT:- [place].AT, No sclera icterus Nose- [place] 2L/min Neck-Supple Neck, +ve JVD,.  Lungs-diminished breath sounds, bibasilar rales  CV- S1, S2 normal, irregularly irregular Abd-  +ve B.Sounds, Abd Soft, No tenderness, anterior abdominal wall edema    Extremity/Skin:-3+ edema/anasarca, pedal pulses present  Psych-affect is appropriate, oriented x3 Neuro-generalized weakness, no new focal deficits, no tremors   Data Review:   Micro Results Recent Results (from the past 240 hour(s))  SARS CORONAVIRUS 2 (TAT 6-24 HRS) Nasopharyngeal Nasopharyngeal Swab     Status: None   Collection Time: 02/15/20 12:28 PM   Specimen: Nasopharyngeal Swab  Result Value Ref Range Status   SARS Coronavirus 2 NEGATIVE NEGATIVE Final    Comment: (NOTE) SARS-CoV-2 target nucleic acids are NOT DETECTED. The SARS-CoV-2 RNA is generally detectable in upper and lower respiratory specimens during the acute phase of infection. Negative results do not preclude SARS-CoV-2 infection, do not rule out co-infections with  other pathogens, and should not be used as the sole basis for treatment or other patient management decisions. Negative results must be combined with clinical observations, patient history, and epidemiological information. The expected result is Negative. Fact Sheet for Patients: SugarRoll.be Fact Sheet for Healthcare Providers: https://www.woods-mathews.com/ This test is not yet approved or cleared by the [place] FDA and  has been authorized for detection and/or diagnosis of SARS-CoV-2 by FDA under an Emergency Use Authorization (EUA). This EUA will remain  in effect (meaning this test can be used) for the duration of the COVID-19 declaration under Section 56 4(b)(1) of the Act, 21 U.S.C. section 360bbb-3(b)(1), unless the authorization is terminated or revoked sooner. Performed at [facility],  [address] [place], [place] [address]   MRSA PCR Screening     Status: None   Collection Time: 02/16/20  3:57 AM   Specimen: Nasal Mucosa; Nasopharyngeal  Result Value Ref Range Status   MRSA by PCR NEGATIVE NEGATIVE Final    Comment:        The GeneXpert MRSA Assay (FDA approved for NASAL specimens only), is one component of a comprehensive MRSA colonization surveillance program. It is not intended to diagnose MRSA infection nor to guide or monitor treatment for MRSA infections. Performed at [facility], [address], [place], [place] [address]     Radiology Reports DG Chest [place] 1 View  Result Date: 02/14/2020 CLINICAL DATA:  Shortness of breath and fluid retention. EXAM: PORTABLE CHEST 1 VIEW COMPARISON:  PA and lateral chest 02/08/2020 and 05/20/2019. FINDINGS: There is marked cardiomegaly and pulmonary vascular congestion. No consolidative process, pneumothorax or effusion. Atherosclerosis is noted. No acute or focal bony abnormality. IMPRESSION: Cardiomegaly and vascular congestion. Atherosclerosis. Electronically Signed   By: Inge Rise M.D.   On: 02/14/2020 17:45   ECHOCARDIOGRAM COMPLETE  Result Date: 02/16/2020    ECHOCARDIOGRAM REPORT   Patient Name:   JALIAH FOODY Date of Exam: 02/16/2020 Medical Rec #:  [id number]             Height:       66.0 in Accession #:    [id number]            Weight:       266.1 lb Date of Birth:  1961/06/07            BSA:          2.26 m Patient Age:    50 years              BP:           126/89 mmHg Patient Gender: F                     HR:           61 bpm. Exam Location:  [facility] Procedure: 2D Echo Indications:    Dyspnea 786.09 / R06.00  History:        Patient has prior history of Echocardiogram examinations, most                 recent 09/22/2017. CHF, Stroke and COPD; Risk Factors:Current                 Smoker, Diabetes and Hypertension. Crack cocaine use, RBBB,                 Acute renal failure , LVH , Acute  respiratory failure with  hypoxia.  Sonographer:    Leavy Cella RDCS (AE) Referring Phys: [id number] Jomari Bartnik IMPRESSIONS  1. Left ventricular ejection fraction, by estimation, is 55 to 60%. The left ventricle has normal function. The left ventricle has no regional wall motion abnormalities. There is mild concentric left ventricular hypertrophy. Left ventricular diastolic parameters are indeterminate. Elevated left ventricular end-diastolic pressure.  2. Right ventricular systolic function is severely reduced. The right ventricular size is moderately enlarged. mildly increased right ventricular wall thickness. There is moderately elevated pulmonary artery systolic pressure.  3. Left atrial size was severely dilated.  4. Right atrial size was severely dilated.  5. The mitral valve is grossly normal. Mild mitral valve regurgitation.  6. Tricuspid valve regurgitation is moderate.  7. The aortic valve is tricuspid. Aortic valve regurgitation is not visualized. No aortic stenosis is present.  8. The inferior vena cava is dilated in size with >50% respiratory variability, suggesting right atrial pressure of 8 mmHg. FINDINGS  Left Ventricle: Left ventricular ejection fraction, by estimation, is 55 to 60%. The left ventricle has normal function. The left ventricle has no regional wall motion abnormalities. The left ventricular internal cavity size was normal in size. There is  mild concentric left ventricular hypertrophy. Left ventricular diastolic parameters are indeterminate. Elevated left ventricular end-diastolic pressure. Right Ventricle: The right ventricular size is moderately enlarged. Mildly increased right ventricular wall thickness. Right ventricular systolic function is severely reduced. There is moderately elevated pulmonary artery systolic pressure. The tricuspid  regurgitant velocity is 3.12 m/s, and with an assumed right atrial pressure of 10 mmHg, the estimated right ventricular  systolic pressure is 91.4 mmHg. Left Atrium: Left atrial size was severely dilated. Right Atrium: Right atrial size was severely dilated. Pericardium: There is no evidence of pericardial effusion. Mitral Valve: The mitral valve is grossly normal. Mild mitral annular calcification. Mild mitral valve regurgitation. Tricuspid Valve: The tricuspid valve is grossly normal. Tricuspid valve regurgitation is moderate. Aortic Valve: The aortic valve is tricuspid. Aortic valve regurgitation is not visualized. No aortic stenosis is present. Pulmonic Valve: The pulmonic valve was grossly normal. Pulmonic valve regurgitation is not visualized. Aorta: The aortic root is normal in size and structure. Venous: The inferior vena cava is dilated in size with greater than 50% respiratory variability, suggesting right atrial pressure of 8 mmHg. IAS/Shunts: No atrial level shunt detected by color flow Doppler.  LEFT VENTRICLE PLAX 2D LVIDd:         4.54 cm  Diastology LVIDs:         2.77 cm  LV e' lateral:   11.20 cm/s LV PW:         1.40 cm  LV E/e' lateral: 7.8 LV IVS:        1.16 cm  LV e' medial:    4.24 cm/s LVOT diam:     2.10 cm  LV E/e' medial:  20.7 LV SV Index:   26.96 LVOT Area:     3.46 cm  RIGHT VENTRICLE RV S prime:     8.05 cm/s LEFT ATRIUM              Index       RIGHT ATRIUM           Index LA diam:        4.40 cm  1.95 cm/m  RA Area:     41.10 cm LA Vol (A2C):   143.0 ml 63.34 ml/m RA Volume:   176.00 ml 77.95 ml/m LA Vol (A4C):  99.1 ml  43.89 ml/m LA Biplane Vol: 121.0 ml 53.59 ml/m   AORTA Ao Root diam: 3.40 cm MITRAL VALVE               TRICUSPID VALVE MV Area (PHT): 4.06 cm    TR Peak grad:   38.9 mmHg MV Decel Time: 187 msec    TR Vmax:        312.00 cm/s MR Peak grad: 62.1 mmHg MR Vmax:      394.00 cm/s  SHUNTS MV E velocity: 87.80 cm/s  Systemic Diam: 2.10 cm MV A velocity: 47.50 cm/s MV E/A ratio:  1.85 Kate Sable MD Electronically signed by Kate Sable MD Signature Date/Time:  02/16/2020/4:32:20 PM    Final    US Abdomen Limited RUQ  Result Date: 02/16/2020 CLINICAL DATA:  Bilirubinemia EXAM: ULTRASOUND ABDOMEN LIMITED RIGHT UPPER QUADRANT COMPARISON:  None. FINDINGS: Gallbladder: No gallstones or wall thickening visualized. No sonographic Murphy sign noted by sonographer. Common bile duct: Diameter: 3 mm Liver: No focal lesion identified. Within normal limits in parenchymal echogenicity. Portal vein is patent on color Doppler imaging with normal direction of blood flow towards the liver. IMPRESSION: Negative right upper quadrant ultrasound. Electronically Signed   By: Monte Fantasia M.D.   On: 02/16/2020 10:40     CBC Recent Labs  Lab 02/14/20 1709 02/15/20 1312 02/16/20 0319  WBC 4.6 5.3 5.3  HGB 11.6* 11.2* 11.3*  HCT 40.2 39.1 39.8  PLT 108* 100* 87*  MCV 94.8 96.1 95.4  MCH 27.4 27.5 27.1  MCHC 28.9* 28.6* 28.4*  RDW 20.5* 20.8* 20.6*  LYMPHSABS 1.1  --   --   MONOABS 0.3  --   --   EOSABS 0.0  --   --   BASOSABS 0.0  --   --     Chemistries  Recent Labs  Lab 02/14/20 1709 02/15/20 1312 02/16/20 0319  NA 141 142 141  K 2.7* 2.6* 3.4*  CL 103 105 105  CO2 '24 24 25  ' GLUCOSE 156* 116* 111*  BUN 36* 35* 33*  CREATININE 2.02* 2.02* 1.71*  CALCIUM 7.7* 7.6* 8.0*  MG  --  1.4*  --   AST '30 31 30  ' ALT '29 30 28  ' ALKPHOS 196* 193* 194*  BILITOT 2.2* 2.3* 2.5*   ------------------------------------------------------------------------------------------------------------------ No results for input(s): CHOL, HDL, LDLCALC, TRIG, CHOLHDL, LDLDIRECT in the last 72 hours.  Lab Results  Component Value Date   HGBA1C 7.5 (H) 02/15/2020   ------------------------------------------------------------------------------------------------------------------ Recent Labs    02/15/20 1709  TSH 1.960   ------------------------------------------------------------------------------------------------------------------ No results for input(s): VITAMINB12,  FOLATE, FERRITIN, TIBC, IRON, RETICCTPCT in the last 72 hours.  Coagulation profile Recent Labs  Lab 02/14/20 1709 02/15/20 1312  INR 2.9* 3.1*    No results for input(s): DDIMER in the last 72 hours.  Cardiac Enzymes No results for input(s): CKMB, TROPONINI, MYOGLOBIN in the last 168 hours.  Invalid input(s): CK ------------------------------------------------------------------------------------------------------------------    Component Value Date/Time   BNP 454.0 (H) 02/15/2020 1312     Roxan Hockey M.D on 02/16/2020 at 7:02 PM  Go to [email or web address] - for contact info  [facility] - Office  [phone]

## 2020-02-16 NOTE — Plan of Care (Signed)
  Problem: Acute Rehab PT Goals(only PT should resolve) Goal: Pt Will Go Supine/Side To Sit Outcome: Progressing Flowsheets (Taken 02/16/2020 1230) Pt will go Supine/Side to Sit: with modified independence Goal: Patient Will Transfer Sit To/From Stand Outcome: Progressing Flowsheets (Taken 02/16/2020 1230) Patient will transfer sit to/from stand: with modified independence Goal: Pt Will Transfer Bed To Chair/Chair To Bed Outcome: Progressing Flowsheets (Taken 02/16/2020 1230) Pt will Transfer Bed to Chair/Chair to Bed: with supervision Goal: Pt Will Ambulate Outcome: Progressing Flowsheets (Taken 02/16/2020 1230) Pt will Ambulate:  100 feet  with rolling walker  with supervision   12:31 PM, 02/16/20 Lonell Grandchild, MPT Physical Therapist with [facility] [phone] office [phone] mobile phone

## 2020-02-17 ENCOUNTER — Inpatient Hospital Stay (HOSPITAL_COMMUNITY): Payer: Medicare Other

## 2020-02-17 LAB — GLUCOSE, CAPILLARY
Glucose-Capillary: 130 mg/dL — ABNORMAL HIGH (ref 70–99)
Glucose-Capillary: 139 mg/dL — ABNORMAL HIGH (ref 70–99)
Glucose-Capillary: 116 mg/dL — ABNORMAL HIGH (ref 70–99)
Glucose-Capillary: 150 mg/dL — ABNORMAL HIGH (ref 70–99)

## 2020-02-17 LAB — COMPREHENSIVE METABOLIC PANEL
ALT: 25 U/L (ref 0–44)
AST: 23 U/L (ref 15–41)
Albumin: 2.8 g/dL — ABNORMAL LOW (ref 3.5–5.0)
Alkaline Phosphatase: 182 U/L — ABNORMAL HIGH (ref 38–126)
Anion gap: 15 (ref 5–15)
BUN: 34 mg/dL — ABNORMAL HIGH (ref 6–20)
CO2: 27 mmol/L (ref 22–32)
Calcium: 8.1 mg/dL — ABNORMAL LOW (ref 8.9–10.3)
Chloride: 101 mmol/L (ref 98–111)
Creatinine, Ser: 1.74 mg/dL — ABNORMAL HIGH (ref 0.44–1.00)
GFR calc Af Amer: 37 mL/min — ABNORMAL LOW (ref >60)
GFR calc non Af Amer: 32 mL/min — ABNORMAL LOW (ref >60)
Glucose, Bld: 132 mg/dL — ABNORMAL HIGH (ref 70–99)
Potassium: 3.9 mmol/L (ref 3.5–5.1)
Sodium: 143 mmol/L (ref 135–145)
Total Bilirubin: 1.8 mg/dL — ABNORMAL HIGH (ref 0.3–1.2)
Total Protein: 5.5 g/dL — ABNORMAL LOW (ref 6.5–8.1)

## 2020-02-17 LAB — CBC
HCT: 40.1 % (ref 36.0–46.0)
Hemoglobin: 11.1 g/dL — ABNORMAL LOW (ref 12.0–15.0)
MCH: 27.2 pg (ref 26.0–34.0)
MCHC: 27.7 g/dL — ABNORMAL LOW (ref 30.0–36.0)
MCV: 98.3 fL (ref 80.0–100.0)
Platelets: 84 10*3/uL — ABNORMAL LOW (ref 150–400)
RBC: 4.08 MIL/uL (ref 3.87–5.11)
RDW: 21.2 % — ABNORMAL HIGH (ref 11.5–15.5)
WBC: 5.8 10*3/uL (ref 4.0–10.5)
nRBC: 0.3 % — ABNORMAL HIGH (ref 0.0–0.2)

## 2020-02-17 MED ORDER — NICOTINE 21 MG/24HR TD PT24
21.0000 mg | MEDICATED_PATCH | Freq: Every day | TRANSDERMAL | Status: DC
  Administered 2020-02-17 – 2020-02-21 (×5): 21 mg via TRANSDERMAL
  Filled 2020-02-17 (×5): qty 1

## 2020-02-17 NOTE — Progress Notes (Signed)
Physical Therapy Treatment Patient Details Name: Tracy [name] MRN: [id number] DOB: [birth year] Today's Date: 02/17/2020    History of Present Illness Tracy [name]  is a 59 y.o. female past medical history relevant for medication lifestyle noncompliance, tobacco abuse, cocaine use (in remission), HTN, DM 2, COPD, history of prior stroke, history of right lower extremity DVT on chronic anticoagulation, HFpEF and morbid obesity who presents to the ED with worsening shortness of breath and 20 pound weight gain over the last couple of weeks    PT Comments    Patient presents seated in chair (assisted by nursing staff) and agreeable for therapy.  Patient demonstrates good return for completing BLE ROM/strengthening exercises while seated in chair, increased endurance/distance for gait training with slightly labored slow cadence, on 3 LPM with SpO2 at 97-100%, limited due to fatigue and continued sitting up in chair with her daughter present in room.  Patient will benefit from continued physical therapy in hospital and recommended venue below to increase strength, balance, endurance for safe ADLs and gait.    Follow Up Recommendations  Home health PT;Supervision for mobility/OOB;Supervision - Intermittent     Equipment Recommendations  None recommended by PT    Recommendations for Other Services       Precautions / Restrictions Precautions Precautions: Fall Restrictions Weight Bearing Restrictions: No    Mobility  Bed Mobility               General bed mobility comments: Presents seated in chair (assisted by nursing staff)  Transfers Overall transfer level: Needs assistance Equipment used: Rolling walker (2 wheeled) Transfers: Sit to/from [facility] Sit to Stand: Supervision Stand pivot transfers: Supervision;Min guard       General transfer comment: increased time labored movement  Ambulation/Gait   Gait Distance (Feet): 75  Feet Assistive device: Rolling walker (2 wheeled) Gait Pattern/deviations: Decreased step length - right;Decreased step length - left;Decreased stride length Gait velocity: decreased   General Gait Details: increased endurance/distance for ambulation demonstrating slow labored cadence without loss of balance, occasional standing rest breaks, on 3 LPM O2 with SpO2 at 97-100%, limited secondary to c/o fatigue   Stairs             Wheelchair Mobility    Modified Rankin (Stroke Patients Only)       Balance Overall balance assessment: Needs assistance Sitting-balance support: Feet supported;No upper extremity supported Sitting balance-Leahy Scale: Good Sitting balance - Comments: seated in chair   Standing balance support: During functional activity;Bilateral upper extremity supported Standing balance-Leahy Scale: Fair Standing balance comment: using RW                            Cognition Arousal/Alertness: Awake/alert Behavior During Therapy: WFL for tasks assessed/performed Overall Cognitive Status: Within Functional Limits for tasks assessed                                        Exercises General Exercises - Lower Extremity Long Arc Quad: Seated;AROM;Strengthening;Both;10 reps Hip Flexion/Marching: Seated;AROM;Strengthening;Both;10 reps Toe Raises: Seated;AROM;Strengthening;Both;15 reps Heel Raises: Seated;AROM;Strengthening;Both;15 reps    General Comments        Pertinent Vitals/Pain Pain Assessment: No/denies pain    Home Living                      Prior Function  PT Goals (current goals can now be found in the care plan section) Acute Rehab PT Goals PT Goal Formulation: With patient Time For Goal Achievement: 02/23/20 Potential to Achieve Goals: Good Progress towards PT goals: Progressing toward goals    Frequency    Min 3X/week      PT Plan      Co-evaluation              AM-PAC  PT "6 Clicks" Mobility   Outcome Measure  Help needed turning from your back to your side while in a flat bed without using bedrails?: None Help needed moving from lying on your back to sitting on the side of a flat bed without using bedrails?: A Little Help needed moving to and from a bed to a chair (including a wheelchair)?: A Little Help needed standing up from a chair using your arms (e.g., wheelchair or bedside chair)?: A Little Help needed to walk in hospital room?: A Little Help needed climbing 3-5 steps with a railing? : A Lot 6 Click Score: 18    End of Session Equipment Utilized During Treatment: Oxygen Activity Tolerance: Patient tolerated treatment well;Patient limited by fatigue Patient left: in chair;with call bell/phone within reach Nurse Communication: Mobility status PT Visit Diagnosis: Unsteadiness on feet (R26.81);Other abnormalities of gait and mobility (R26.89);Muscle weakness (generalized) (M62.81)     Time: [id number] PT Time Calculation (min) (ACUTE ONLY): 33 min  Charges:  $Gait Training: 8-22 mins $Therapeutic Exercise: 8-22 mins                     2:45 PM, 02/17/20 Lonell Grandchild, MPT Physical Therapist with [facility] 336 [phone] office [phone] mobile phone

## 2020-02-17 NOTE — Progress Notes (Signed)
Patient Demographics:    Tracy [name], is a 59 y.o. female, DOB - May 18, 1961, [id number]  Admit date - 02/15/2020   Admitting Physician Saeed Toren Denton Brick, MD  Outpatient Primary MD for the patient is Rory Percy, MD  LOS - 2   Chief Complaint  Patient presents with  . Leg Swelling  . Shortness of Breath  . abdominal swelling        Subjective:    Tracy [name] today has no fevers, no emesis,  No chest pain,  -Daughter at bedside, shortness of breath at rest improving, dyspnea on exertion persist  Assessment  & Plan :    Principal Problem:   Acute on chronic heart failure with preserved ejection fraction (HFpEF) (HCC) Active Problems:   Acute exacerbation of CHF (congestive heart failure) (HCC)   Paroxysmal A-fib (HCC)   Tobacco abuse   Noncompliance   Essential hypertension   Diabetes ([place])   Acute respiratory failure with hypoxia ([place])   H/O: CVA (cerebrovascular accident)/ 2013   Diabetes mellitus type 2, uncontrolled ([place])   Rt DVT (deep venous thrombosis) -2014   Anticoagulated on Coumadin   1)HFpEF--- acute on chronic diastolic dysfunction CHF partly due to noncompliance -EF 55 to 60%, -More than 20 pound weight gain over the last couple weeks,  admitted with anasarca -BNP from 02/14/2020 and 02/15/2020 elevated, chest x-ray from 02/14/2020 consistent with CHF -Continue IV Lasix 60 mg every 8 hours -Fluid balance remains negative hypoxia improving now requiring 3 L/min of oxygen -Weight appears to be inaccurate -Daily weight and fluid input and output monitoring -Monitor renal function and electrolytes closely with aggressive diuresis  2)PAFib--- patient with episodes of tachyarrhythmia / paroxysmal A. Fib--- already on Coumadin for anticoagulation due to prior DVT, -TSH is 1.9, continue metoprolol 25 mg twice daily for rate control  3)History of medication  noncompliance and prior history of polysubstance abuse--- UDS negative today, as per patient's daughter Tracy [name], no ongoing drug use -Patient was seen at [facility] in [place] on 02/08/2020, admission was advised and patient left AMA -Patient was seen here at [facility] on 02/14/2020 she declined admission and left AMA -Patient returned 02/15/2020--attempted to leave AMA again patient's daughter Tracy [name] had to repeatedly begged patient to stay this time to be treated -Would benefit from home health referral post discharge compliance  4)Acute on chronic hypoxic respiratory failure-PTA was on 2 L/min, suspect mostly due to #1 above -Treat as above in #1 and #2  5)AKI----acute kidney injury -worsening renal function most likely due to poor renal perfusion on the setting of worsening CHF  --- creatinine on admission= 2.0, baseline creatinine = 1.1   -creatinine is down to 1.7, renally adjust medications, avoid nephrotoxic agents / dehydration  / hypotension -Hold lisinopril HCTZ, -Hold Metformin, decrease gabapentin to twice daily from 3 times daily   6)DM2-previously uncontrolled, hold Jardiance, hold Metformin, Use Novolog/Humalog Sliding scale insulin with Accu-Cheks/Fingersticks as ordered   7)Chronic Anticoagulation--- patient is chronically on Coumadin for prior DVT (2014), now found to have PAFib -Continue Coumadin  8) history of CVA in 2013--with prior right-sided hemiparesis, PTA patient was not compliant with atorvastatin, continue atorvastatin , continue   Coumadin as above  9) hypokalemia/hypomagnesemia--replaced on recheck, continue to hold HCTZ, -Patient  needs Lasix for diuresis  10) elevated bilirubin and alk phos--- liver ultrasound without acute abnormalities, T bili 2.5 alk phos 194 - AST and ALT are not elevated -Outpatient GI follow-up advised  11)COPD/tobacco abuse--patient smokes more than 1.5 pack a day, continue nicotine patch, mucolytics and bronchodilators as advised -No  significant COPD exacerbation at this time, hold off on steroids  12) generalized weakness/deconditioning--PT eval appreciated recommends discharge home with home health  Disposition/Need for in-Hospital Stay- patient unable to be discharged at this time due to --- hypoxic respiratory failure due to CHF requiring further IV diuresis -Not medically ready for discharge home  Code Status : Full  Family Communication:     (patient is alert, awake and coherent) Discussed with daughter Tracy [name]  Consults  :  Na  DVT Prophylaxis  : Coumadin- SCDs   Lab Results  Component Value Date   PLT 84 (L) 02/17/2020    Inpatient Medications  Scheduled Meds: . atorvastatin  10 mg Oral q1800  . Chlorhexidine Gluconate Cloth  6 each Topical Daily  . furosemide  60 mg Intravenous Q8H  . gabapentin  300 mg Oral BID  . insulin aspart  0-5 Units Subcutaneous QHS  . insulin aspart  0-9 Units Subcutaneous TID WC  . mouth rinse  15 mL Mouth Rinse BID  . metoprolol tartrate  25 mg Oral BID  . mometasone-formoterol  2 puff Inhalation BID  . nicotine  21 mg Transdermal Daily  . potassium chloride SA  20 mEq Oral TID  . sodium chloride flush  3 mL Intravenous Q12H  . traZODone  100 mg Oral QHS   Continuous Infusions: . sodium chloride     PRN Meds:.sodium chloride, acetaminophen **OR** acetaminophen, albuterol, HYDROcodone-acetaminophen, ondansetron **OR** ondansetron (ZOFRAN) IV, polyethylene glycol, sodium chloride flush    Anti-infectives (From admission, onward)   None        Objective:   Vitals:   02/17/20 1000 02/17/20 1100 02/17/20 1145 02/17/20 1611  BP: 118/77  112/68   Pulse:  92 72   Resp: 19 19 (!) 22   Temp:      TempSrc:      SpO2:  97% 97%   Weight:    122.6 kg  Height:        Wt Readings from Last 3 Encounters:  02/17/20 122.6 kg  02/14/20 117.9 kg  01/18/20 110.3 kg     Intake/Output Summary (Last 24 hours) at 02/17/2020 1759 Last data filed at 02/17/2020  0500 Gross per 24 hour  Intake --  Output 1250 ml  Net -1250 ml     Physical Exam  Gen:- Awake Alert,speak in short sentences HEENT:- [place].AT, No sclera icterus Nose- [place] 3L/min Neck-Supple Neck, +ve JVD,.  Lungs-diminished breath sounds, bibasilar rales  CV- S1, S2 normal, irregularly irregular Abd-  +ve B.Sounds, Abd Soft, No tenderness, improving anterior abdominal wall edema    Extremity/Skin: Improving edema/anasarca, pedal pulses present  Psych-affect is appropriate, oriented x3 Neuro-generalized weakness, no new focal deficits, no tremors   Data Review:   Micro Results Recent Results (from the past 240 hour(s))  SARS CORONAVIRUS 2 (TAT 6-24 HRS) Nasopharyngeal Nasopharyngeal Swab     Status: None   Collection Time: 02/15/20 12:28 PM   Specimen: Nasopharyngeal Swab  Result Value Ref Range Status   SARS Coronavirus 2 NEGATIVE NEGATIVE Final    Comment: (NOTE) SARS-CoV-2 target nucleic acids are NOT DETECTED. The SARS-CoV-2 RNA is generally detectable in upper and lower respiratory specimens during  the acute phase of infection. Negative results do not preclude SARS-CoV-2 infection, do not rule out co-infections with other pathogens, and should not be used as the sole basis for treatment or other patient management decisions. Negative results must be combined with clinical observations, patient history, and epidemiological information. The expected result is Negative. Fact Sheet for Patients: SugarRoll.be Fact Sheet for Healthcare Providers: https://www.woods-mathews.com/ This test is not yet approved or cleared by the [place] FDA and  has been authorized for detection and/or diagnosis of SARS-CoV-2 by FDA under an Emergency Use Authorization (EUA). This EUA will remain  in effect (meaning this test can be used) for the duration of the COVID-19 declaration under Section 56 4(b)(1) of the Act, 21 U.S.C. section  360bbb-3(b)(1), unless the authorization is terminated or revoked sooner. Performed at [facility], [place] [address] [place], [place] [address]   MRSA PCR Screening     Status: None   Collection Time: 02/16/20  3:57 AM   Specimen: Nasal Mucosa; Nasopharyngeal  Result Value Ref Range Status   MRSA by PCR NEGATIVE NEGATIVE Final    Comment:        The GeneXpert MRSA Assay (FDA approved for NASAL specimens only), is one component of a comprehensive MRSA colonization surveillance program. It is not intended to diagnose MRSA infection nor to guide or monitor treatment for MRSA infections. Performed at [facility], [address], [place], [place] [address]     Radiology Reports DG Chest [place] 1 View  Result Date: 02/17/2020 CLINICAL DATA:  Shortness of breath EXAM: PORTABLE CHEST 1 VIEW COMPARISON:  February 14, 2020 FINDINGS: There is scarring in the right base region. There is mild atelectasis in the left base. There is stable cardiomegaly with pulmonary venous hypertension. No adenopathy. There is aortic atherosclerosis. No bone lesions. IMPRESSION: Cardiomegaly with pulmonary vascular congestion. Mild scarring right base. Mild atelectasis left base. No appreciable pulmonary edema. Aortic Atherosclerosis (ICD10-I70.0). Electronically Signed   By: Lowella Grip III M.D.   On: 02/17/2020 07:46   DG Chest Port 1 View  Result Date: 02/14/2020 CLINICAL DATA:  Shortness of breath and fluid retention. EXAM: PORTABLE CHEST 1 VIEW COMPARISON:  PA and lateral chest 02/08/2020 and 05/20/2019. FINDINGS: There is marked cardiomegaly and pulmonary vascular congestion. No consolidative process, pneumothorax or effusion. Atherosclerosis is noted. No acute or focal bony abnormality. IMPRESSION: Cardiomegaly and vascular congestion. Atherosclerosis. Electronically Signed   By: Inge Rise M.D.   On: 02/14/2020 17:45   ECHOCARDIOGRAM COMPLETE  Result Date: 02/16/2020    ECHOCARDIOGRAM  REPORT   Patient Name:   Tracy [name] Date of Exam: 02/16/2020 Medical Rec #:  [id number]             Height:       66.0 in Accession #:    [id number]            Weight:       266.1 lb Date of Birth:  August 18, 1961            BSA:          2.26 m Patient Age:    38 years              BP:           126/89 mmHg Patient Gender: F                     HR:  61 bpm. Exam Location:  [facility] Procedure: 2D Echo Indications:    Dyspnea 786.09 / R06.00  History:        Patient has prior history of Echocardiogram examinations, most                 recent 09/22/2017. CHF, Stroke and COPD; Risk Factors:Current                 Smoker, Diabetes and Hypertension. Crack cocaine use, RBBB,                 Acute renal failure , LVH , Acute respiratory failure with                 hypoxia.  Sonographer:    Leavy Cella RDCS (AE) Referring Phys: [id number] Corey Laski IMPRESSIONS  1. Left ventricular ejection fraction, by estimation, is 55 to 60%. The left ventricle has normal function. The left ventricle has no regional wall motion abnormalities. There is mild concentric left ventricular hypertrophy. Left ventricular diastolic parameters are indeterminate. Elevated left ventricular end-diastolic pressure.  2. Right ventricular systolic function is severely reduced. The right ventricular size is moderately enlarged. mildly increased right ventricular wall thickness. There is moderately elevated pulmonary artery systolic pressure.  3. Left atrial size was severely dilated.  4. Right atrial size was severely dilated.  5. The mitral valve is grossly normal. Mild mitral valve regurgitation.  6. Tricuspid valve regurgitation is moderate.  7. The aortic valve is tricuspid. Aortic valve regurgitation is not visualized. No aortic stenosis is present.  8. The inferior vena cava is dilated in size with >50% respiratory variability, suggesting right atrial pressure of 8 mmHg. FINDINGS  Left Ventricle: Left ventricular ejection  fraction, by estimation, is 55 to 60%. The left ventricle has normal function. The left ventricle has no regional wall motion abnormalities. The left ventricular internal cavity size was normal in size. There is  mild concentric left ventricular hypertrophy. Left ventricular diastolic parameters are indeterminate. Elevated left ventricular end-diastolic pressure. Right Ventricle: The right ventricular size is moderately enlarged. Mildly increased right ventricular wall thickness. Right ventricular systolic function is severely reduced. There is moderately elevated pulmonary artery systolic pressure. The tricuspid  regurgitant velocity is 3.12 m/s, and with an assumed right atrial pressure of 10 mmHg, the estimated right ventricular systolic pressure is 59.9 mmHg. Left Atrium: Left atrial size was severely dilated. Right Atrium: Right atrial size was severely dilated. Pericardium: There is no evidence of pericardial effusion. Mitral Valve: The mitral valve is grossly normal. Mild mitral annular calcification. Mild mitral valve regurgitation. Tricuspid Valve: The tricuspid valve is grossly normal. Tricuspid valve regurgitation is moderate. Aortic Valve: The aortic valve is tricuspid. Aortic valve regurgitation is not visualized. No aortic stenosis is present. Pulmonic Valve: The pulmonic valve was grossly normal. Pulmonic valve regurgitation is not visualized. Aorta: The aortic root is normal in size and structure. Venous: The inferior vena cava is dilated in size with greater than 50% respiratory variability, suggesting right atrial pressure of 8 mmHg. IAS/Shunts: No atrial level shunt detected by color flow Doppler.  LEFT VENTRICLE PLAX 2D LVIDd:         4.54 cm  Diastology LVIDs:         2.77 cm  LV e' lateral:   11.20 cm/s LV PW:         1.40 cm  LV E/e' lateral: 7.8 LV IVS:        1.16 cm  LV  e' medial:    4.24 cm/s LVOT diam:     2.10 cm  LV E/e' medial:  20.7 LV SV Index:   26.96 LVOT Area:     3.46 cm  RIGHT  VENTRICLE RV S prime:     8.05 cm/s LEFT ATRIUM              Index       RIGHT ATRIUM           Index LA diam:        4.40 cm  1.95 cm/m  RA Area:     41.10 cm LA Vol (A2C):   143.0 ml 63.34 ml/m RA Volume:   176.00 ml 77.95 ml/m LA Vol (A4C):   99.1 ml  43.89 ml/m LA Biplane Vol: 121.0 ml 53.59 ml/m   AORTA Ao Root diam: 3.40 cm MITRAL VALVE               TRICUSPID VALVE MV Area (PHT): 4.06 cm    TR Peak grad:   38.9 mmHg MV Decel Time: 187 msec    TR Vmax:        312.00 cm/s MR Peak grad: 62.1 mmHg MR Vmax:      394.00 cm/s  SHUNTS MV E velocity: 87.80 cm/s  Systemic Diam: 2.10 cm MV A velocity: 47.50 cm/s MV E/A ratio:  1.85 Kate Sable MD Electronically signed by Kate Sable MD Signature Date/Time: 02/16/2020/4:32:20 PM    Final    US Abdomen Limited RUQ  Result Date: 02/16/2020 CLINICAL DATA:  Bilirubinemia EXAM: ULTRASOUND ABDOMEN LIMITED RIGHT UPPER QUADRANT COMPARISON:  None. FINDINGS: Gallbladder: No gallstones or wall thickening visualized. No sonographic Murphy sign noted by sonographer. Common bile duct: Diameter: 3 mm Liver: No focal lesion identified. Within normal limits in parenchymal echogenicity. Portal vein is patent on color Doppler imaging with normal direction of blood flow towards the liver. IMPRESSION: Negative right upper quadrant ultrasound. Electronically Signed   By: Monte Fantasia M.D.   On: 02/16/2020 10:40     CBC Recent Labs  Lab 02/14/20 1709 02/15/20 1312 02/16/20 0319 02/17/20 0347  WBC 4.6 5.3 5.3 5.8  HGB 11.6* 11.2* 11.3* 11.1*  HCT 40.2 39.1 39.8 40.1  PLT 108* 100* 87* 84*  MCV 94.8 96.1 95.4 98.3  MCH 27.4 27.5 27.1 27.2  MCHC 28.9* 28.6* 28.4* 27.7*  RDW 20.5* 20.8* 20.6* 21.2*  LYMPHSABS 1.1  --   --   --   MONOABS 0.3  --   --   --   EOSABS 0.0  --   --   --   BASOSABS 0.0  --   --   --     Chemistries  Recent Labs  Lab 02/14/20 1709 02/15/20 1312 02/16/20 0319 02/17/20 0347  NA 141 142 141 143  K 2.7* 2.6* 3.4* 3.9   CL 103 105 105 101  CO2 '24 24 25 27  '$ GLUCOSE 156* 116* 111* 132*  BUN 36* 35* 33* 34*  CREATININE 2.02* 2.02* 1.71* 1.74*  CALCIUM 7.7* 7.6* 8.0* 8.1*  MG  --  1.4*  --   --   AST '30 31 30 23  '$ ALT '29 30 28 25  '$ ALKPHOS 196* 193* 194* 182*  BILITOT 2.2* 2.3* 2.5* 1.8*   ------------------------------------------------------------------------------------------------------------------ No results for input(s): CHOL, HDL, LDLCALC, TRIG, CHOLHDL, LDLDIRECT in the last 72 hours.  Lab Results  Component Value Date   HGBA1C 7.5 (H) 02/15/2020   ------------------------------------------------------------------------------------------------------------------ Recent Labs  02/15/20 1709  TSH 1.960   ------------------------------------------------------------------------------------------------------------------ No results for input(s): VITAMINB12, FOLATE, FERRITIN, TIBC, IRON, RETICCTPCT in the last 72 hours.  Coagulation profile Recent Labs  Lab 02/14/20 1709 02/15/20 1312  INR 2.9* 3.1*    No results for input(s): DDIMER in the last 72 hours.  Cardiac Enzymes No results for input(s): CKMB, TROPONINI, MYOGLOBIN in the last 168 hours.  Invalid input(s): CK ------------------------------------------------------------------------------------------------------------------    Component Value Date/Time   BNP 454.0 (H) 02/15/2020 1312   Roxan Hockey M.D on 02/17/2020 at 5:59 PM  Go to [email or web address] - for contact info  [facility] - Office  [phone]

## 2020-02-18 LAB — RENAL FUNCTION PANEL
Albumin: 3 g/dL — ABNORMAL LOW (ref 3.5–5.0)
Anion gap: 11 (ref 5–15)
BUN: 40 mg/dL — ABNORMAL HIGH (ref 6–20)
CO2: 28 mmol/L (ref 22–32)
Calcium: 8.4 mg/dL — ABNORMAL LOW (ref 8.9–10.3)
Chloride: 100 mmol/L (ref 98–111)
Creatinine, Ser: 2.03 mg/dL — ABNORMAL HIGH (ref 0.44–1.00)
GFR calc Af Amer: 30 mL/min — ABNORMAL LOW (ref >60)
GFR calc non Af Amer: 26 mL/min — ABNORMAL LOW (ref >60)
Glucose, Bld: 126 mg/dL — ABNORMAL HIGH (ref 70–99)
Phosphorus: 4.6 mg/dL (ref 2.5–4.6)
Potassium: 4.6 mmol/L (ref 3.5–5.1)
Sodium: 139 mmol/L (ref 135–145)

## 2020-02-18 LAB — GLUCOSE, CAPILLARY
Glucose-Capillary: 107 mg/dL — ABNORMAL HIGH (ref 70–99)
Glucose-Capillary: 98 mg/dL (ref 70–99)
Glucose-Capillary: 136 mg/dL — ABNORMAL HIGH (ref 70–99)
Glucose-Capillary: 166 mg/dL — ABNORMAL HIGH (ref 70–99)

## 2020-02-18 NOTE — Progress Notes (Addendum)
Restarted oxygen as patient was 86 on room air , placed on 3.5 liters. Patients oxygen sat is slow recovering. Patient is wheezing asked if she would like neb ,she refused.

## 2020-02-18 NOTE — Progress Notes (Signed)
MEWS Guidelines - (patients age 60 and over)  Red - At High Risk for Deterioration Yellow - At risk for Deterioration  1. Go to room and assess patient 2. Validate data. Is this patient's baseline? If data confirmed: 3. Is this an acute change? 4. Administer prn meds/treatments as ordered. 5. Note Sepsis score 6. Review goals of care 7. Sports coach, RRT nurse and Provider. 8. Ask Provider to come to bedside.  9. Document patient condition/interventions/response. 10. Increase frequency of vital signs and focused assessments to at least q15 minutes x 4, then q30 minutes x2. - If stable, then q1h x3, then q4h x3 and then q8h or dept. routine. - If unstable, contact Provider & RRT nurse. Prepare for possible transfer. 11. Add entry in progress notes using the smart phrase ".MEWS". 1. Go to room and assess patient 2. Validate data. Is this patient's baseline? If data confirmed: 3. Is this an acute change? 4. Administer prn meds/treatments as ordered? 5. Note Sepsis score 6. Review goals of care 7. Sports coach and Provider 8. Call RRT nurse as needed. 9. Document patient condition/interventions/response. 10. Increase frequency of vital signs and focused assessments to at least q2h x2. - If stable, then q4h x2 and then q8h or dept. routine. - If unstable, contact Provider & RRT nurse. Prepare for possible transfer. 11. Add entry in progress notes using the smart phrase ".MEWS".  Green - Likely stable Lavender - Comfort Care Only  1. Continue routine/ordered monitoring.  2. Review goals of care. 1. Continue routine/ordered monitoring. 2. Review goals of care.   Mews change from Green to Yellow.  Environmental health practitioner S. Owens Shark and provider Courage MD. No new orders. Elodia Florence RN

## 2020-02-18 NOTE — TOC Initial Note (Signed)
Transition of Care [facility]) - Initial/Assessment Note    Patient Details  Name: Tracy [name] MRN: [id number] Date of Birth: [birth year]  Transition of Care [facility]) CM/SW Contact:    Boneta Lucks, RN Phone Number: 02/18/2020, 1:54 PM  Clinical Narrative:  Patient admitted for CHF.  From home with children. High risk for readmission.Paient has home 02, uses cane and walker. No equipment needs at this time. Her daughter provides transportation as needed. PT is recommending  HHPT. Patient is agreeing, discussed choices. CM called Georgina Snell with [facility], he accepted the referral.        Expected Discharge Plan: [place] Barriers to Discharge: Continued Medical Work up   Patient Goals and CMS Choice Patient states their goals for this hospitalization and ongoing recovery are:: to go home. CMS Medicare.gov Compare Post Acute Care list provided to:: Patient Choice offered to / list presented to : Patient  Expected Discharge Plan and Services Expected Discharge Plan: [place]      Living arrangements for the past 2 months: Single Family Home                    HH Arranged: PT -  [place]   Prior Living Arrangements/Services Living arrangements for the past 2 months: Single Family Home Lives with:: Adult Children          Need for Family Participation in Patient Care: Yes (Comment) Care giver support system in place?: Yes (comment) Current home services: DME Criminal Activity/Legal Involvement Pertinent to Current Situation/Hospitalization: No - Comment as needed  Activities of Daily Living Home Assistive Devices/Equipment: Cane (specify quad or straight), Walker (specify type) ADL Screening (condition at time of admission) Patient's cognitive ability adequate to safely complete daily activities?: Yes Is the patient deaf or have difficulty hearing?: No Does the patient have difficulty seeing, even when wearing glasses/contacts?: No Does the  patient have difficulty concentrating, remembering, or making decisions?: No Patient able to express need for assistance with ADLs?: No Does the patient have difficulty dressing or bathing?: No Independently performs ADLs?: Yes (appropriate for developmental age) Does the patient have difficulty walking or climbing stairs?: Yes Weakness of Legs: None Weakness of Arms/Hands: None  Permission Sought/Granted     Emotional Assessment     Affect (typically observed): Accepting Orientation: : Oriented to Self, Oriented to Place, Oriented to  Time, Oriented to Situation Alcohol / Substance Use: Not Applicable Psych Involvement: No (comment)  Admission diagnosis:  Hypokalemia [E87.6] Bilirubinemia [E80.6] Anasarca [R60.1] Acute exacerbation of CHF (congestive heart failure) ([place]) [I50.9] Patient Active Problem List   Diagnosis Date Noted  . Paroxysmal A-fib ([place]) 02/16/2020  . Acute exacerbation of CHF (congestive heart failure) ([place]) 02/15/2020  . Acute respiratory failure with hypoxia ([place]) 02/15/2020  . Acute on chronic heart failure with preserved ejection fraction (HFpEF) ([place]) 02/15/2020  . H/O: CVA (cerebrovascular accident)/ 2013 02/15/2020  . Diabetes mellitus type 2, uncontrolled ([place]) 02/15/2020  . Rt DVT (deep venous thrombosis) -2014 02/15/2020  . Anticoagulated on Coumadin 02/15/2020  . Right bundle branch block 09/05/2017  . Long term current use of anticoagulant therapy 03/06/2017  . Body mass index (BMI) of 40.0-44.9 in adult ([place]) 02/20/2017  . Personal history of venous thrombosis and embolism 02/20/2017  . Umbilical hernia [id number]  . Phlebitis and thrombophlebitis of lower extremities 01/13/2017  . Left knee pain 03/05/2016  . Influenza with respiratory manifestations 03/22/2015  . COPD exacerbation ([place]) 03/21/2015  . CAP (  community acquired pneumonia) 07/04/2013  . Sepsis ([place]) 07/04/2013  . Acute renal failure ([place]) 07/04/2013  . Left thyroid nodule  07/04/2013  . Essential hypertension 07/04/2013  . Diabetes ([place]) 07/04/2013  . Acute respiratory failure ([place]) 07/04/2013  . Hemiparesis, acute ([place]) 07/04/2012  . LVH (left ventricular hypertrophy) 07/04/2012  . Acute ischemic stroke ([place]) 07/03/2012  . Hypertensive urgency 07/03/2012  . Tobacco abuse 07/03/2012  . Noncompliance 07/03/2012  . Thrombocytopenia ([place]) 07/03/2012  . Crack cocaine use 07/03/2012   PCP:  Rory Percy, MD Pharmacy:   [place], [place] - [phone] [place] [address] [phone] [place] [address] [place] [address] Phone: [phone] Fax: [phone]  Readmission Risk Interventions Readmission Risk Prevention Plan 02/18/2020  Transportation Screening Complete  Medication Review ([place]) Complete  PCP or Specialist appointment within 3-5 days of discharge Not Complete  HRI or Home Care Consult Complete  SW Recovery Care/Counseling Consult Complete  Palliative Care Screening Not Complete  Skilled [place] Not Complete  Some recent data might be hidden

## 2020-02-18 NOTE — Progress Notes (Signed)
Patient Demographics:    Tracy [name], is a 59 y.o. female, DOB - May 27, 1961, [id number]  Admit date - 02/15/2020   Admitting Physician Meah Jiron Denton Brick, MD  Outpatient Primary MD for the patient is Rory Percy, MD  LOS - 3   Chief Complaint  Patient presents with  . Leg Swelling  . Shortness of Breath  . abdominal swelling        Subjective:    Tracy [name] today has no fevers, no emesis,  No chest pain,  Hypoxia, dyspnea and wheezing persist -Voiding okay  Assessment  & Plan :    Principal Problem:   Acute on chronic heart failure with preserved ejection fraction (HFpEF) (HCC) Active Problems:   Acute exacerbation of CHF (congestive heart failure) (HCC)   Paroxysmal A-fib (HCC)   Tobacco abuse   Noncompliance   Essential hypertension   Diabetes ([place])   Acute respiratory failure with hypoxia (HCC)   H/O: CVA (cerebrovascular accident)/ 2013   Diabetes mellitus type 2, uncontrolled ([place])   Rt DVT (deep venous thrombosis) -2014   Anticoagulated on Coumadin  Brief Summary:- 59 y.o. female past medical history relevant for medication lifestyle noncompliance, tobacco abuse, cocaine use (in remission), HTN, DM 2, COPD, history of prior stroke, history of right lower extremity DVT on chronic anticoagulation, HFpEF and morbid obesity admitted 12/14/2020 with anasarca in the setting of acute on chronic CHF exacerbation  A/p 1)HFpEF--- acute on chronic diastolic dysfunction CHF partly due to noncompliance -EF 55 to 60%, -More than 20 pound weight gain over the last couple weeks PTA-  admitted with anasarca -BNP from 02/14/2020 and 02/15/2020 elevated, chest x-ray from 02/14/2020 consistent with CHF -Continue IV Lasix 60 mg every 8 hours -Fluid balance remains negative hypoxia improving now requiring 3 L/min of oxygen -Weight appears to be inaccurate -Monitor renal function and  electrolytes closely with aggressive diuresis -Fluid restriction advised  2)PAFib--- patient with episodes of tachyarrhythmia / paroxysmal A. Fib--- already on Coumadin for anticoagulation due to prior DVT, -TSH is 1.9, continue metoprolol 25 mg twice daily for rate control  3)History of medication noncompliance and prior history of polysubstance abuse--- UDS negative this admission, as per patient's daughter [name], no ongoing drug use -Patient was seen at [facility] in [place] on 02/08/2020, admission was advised and patient left AMA -Patient was seen here at [facility] on 02/14/2020 she declined admission and left AMA -Patient returned 02/15/2020--attempted to leave AMA again patient's daughter Alyse Low had to repeatedly begged patient to stay this time to be treated -Would benefit from home health referral post discharge compliance -Would benefit from outpatient referral to heart failure program  4)Acute on chronic hypoxic respiratory failure-PTA was on 2 L/min, suspect mostly due to #1 above -Currently requiring 3 to 4 L of oxygen at rest -Treat as above in #1   5)AKI----acute kidney injury -worsening renal function most likely due to poor renal perfusion on the setting of worsening CHF  --- creatinine on admission= 2.0, baseline creatinine = 1.1   -creatinine is currently back to 2.0, r enally adjust medications, avoid nephrotoxic agents / dehydration  / hypotension -Continue to hold lisinopril HCTZ, -Hold Metformin, decreased gabapentin to twice daily from 3 times daily   6)DM2-previously uncontrolled, hold Jardiance,  hold Metformin, Use Novolog/Humalog Sliding scale insulin with Accu-Cheks/Fingersticks as ordered   7)Chronic Anticoagulation--- patient is chronically on Coumadin for prior DVT (2014), now found to have PAFib -Continue Coumadin  8) history of CVA in 2013--with prior right-sided hemiparesis, PTA patient was not compliant with atorvastatin, continue atorvastatin , continue   Coumadin as above  9) hypokalemia/hypomagnesemia--replaced on recheck, continue to hold HCTZ,  10) elevated bilirubin and alk phos--- liver ultrasound without acute abnormalities, T bili 2.5 alk phos 194 - AST and ALT are not elevated -Outpatient GI follow-up advised  11)COPD/tobacco abuse--patient smokes more than 1.5 pack a day, continue nicotine patch, mucolytics and bronchodilators as advised -No significant COPD exacerbation at this time, hold off on steroids  12) generalized weakness/deconditioning--PT eval appreciated recommends discharge home with home health  Disposition/Need for in-Hospital Stay- patient unable to be discharged at this time due to --- hypoxic respiratory failure due to CHF requiring further IV diuresis--oxygen requirement is still above her baseline, dyspnea persist even at rest -Not medically ready for discharge home  Code Status : Full  Family Communication:     (patient is alert, awake and coherent) Discussed with daughter [name]  Consults  :  Na  DVT Prophylaxis  : Coumadin- SCDs   Lab Results  Component Value Date   PLT 84 (L) 02/17/2020    Inpatient Medications  Scheduled Meds: . atorvastatin  10 mg Oral q1800  . Chlorhexidine Gluconate Cloth  6 each Topical Daily  . furosemide  60 mg Intravenous Q8H  . gabapentin  300 mg Oral BID  . insulin aspart  0-5 Units Subcutaneous QHS  . insulin aspart  0-9 Units Subcutaneous TID WC  . mouth rinse  15 mL Mouth Rinse BID  . metoprolol tartrate  25 mg Oral BID  . mometasone-formoterol  2 puff Inhalation BID  . nicotine  21 mg Transdermal Daily  . potassium chloride SA  20 mEq Oral TID  . sodium chloride flush  3 mL Intravenous Q12H  . traZODone  100 mg Oral QHS   Continuous Infusions: . sodium chloride     PRN Meds:.sodium chloride, acetaminophen **OR** acetaminophen, albuterol, HYDROcodone-acetaminophen, ondansetron **OR** ondansetron (ZOFRAN) IV, polyethylene glycol, sodium chloride  flush    Anti-infectives (From admission, onward)   None        Objective:   Vitals:   02/18/20 0955 02/18/20 1358 02/18/20 1924 02/18/20 1927  BP:  109/70    Pulse: (!) 110 92 (!) 101 (!) 101  Resp:  _0 Temp:      TempSrc:      SpO2:  94% (!) 86% 90%  Weight:      Height:        Wt Readings from Last 3 Encounters:  02/18/20 123.3 kg  02/14/20 117.9 kg  01/18/20 110.3 kg     Intake/Output Summary (Last 24 hours) at 02/18/2020 1952 Last data filed at 02/18/2020 0900 Gross per 24 hour  Intake 726 ml  Output 400 ml  Net 326 ml     Physical Exam  Gen:- Awake Alert, speak in short sentences HEENT:- [place].AT, No sclera icterus Nose- [place] 3L/min Neck-Supple Neck, +ve JVD,.  Lungs-diminished breath sounds, bibasilar rales, few upper lobe wheezes  CV- S1, S2 normal, irregularly irregular Abd-  +ve B.Sounds, Abd Soft, No tenderness, improving anterior abdominal wall edema    Extremity/Skin: Improving edema of the lower abdomen, pubic area, thighs and legs and feet --improving anasarca, pedal pulses present  Psych-affect is  appropriate, oriented x3 Neuro-generalized weakness, no new focal deficits, no tremors   Data Review:   Micro Results Recent Results (from the past 240 hour(s))  SARS CORONAVIRUS 2 (TAT 6-24 HRS) Nasopharyngeal Nasopharyngeal Swab     Status: None   Collection Time: 02/15/20 12:28 PM   Specimen: Nasopharyngeal Swab  Result Value Ref Range Status   SARS Coronavirus 2 NEGATIVE NEGATIVE Final    Comment: (NOTE) SARS-CoV-2 target nucleic acids are NOT DETECTED. The SARS-CoV-2 RNA is generally detectable in upper and lower respiratory specimens during the acute phase of infection. Negative results do not preclude SARS-CoV-2 infection, do not rule out co-infections with other pathogens, and should not be used as the sole basis for treatment or other patient management decisions. Negative results must be combined with clinical  observations, patient history, and epidemiological information. The expected result is Negative. Fact Sheet for Patients: SugarRoll.be Fact Sheet for Healthcare Providers: https://www.woods-mathews.com/ This test is not yet approved or cleared by the [place] FDA and  has been authorized for detection and/or diagnosis of SARS-CoV-2 by FDA under an Emergency Use Authorization (EUA). This EUA will remain  in effect (meaning this test can be used) for the duration of the COVID-19 declaration under Section 56 4(b)(1) of the Act, 21 U.S.C. section 360bbb-3(b)(1), unless the authorization is terminated or revoked sooner. Performed at [facility], [place] [address], [place], [place] [address]   MRSA PCR Screening     Status: None   Collection Time: 02/16/20  3:57 AM   Specimen: Nasal Mucosa; Nasopharyngeal  Result Value Ref Range Status   MRSA by PCR NEGATIVE NEGATIVE Final    Comment:        The GeneXpert MRSA Assay (FDA approved for NASAL specimens only), is one component of a comprehensive MRSA colonization surveillance program. It is not intended to diagnose MRSA infection nor to guide or monitor treatment for MRSA infections. Performed at [facility], [address] [place],  [address]     Radiology Reports DG Chest [place] 1 View  Result Date: 02/17/2020 CLINICAL DATA:  Shortness of breath EXAM: PORTABLE CHEST 1 VIEW COMPARISON:  February 14, 2020 FINDINGS: There is scarring in the right base region. There is mild atelectasis in the left base. There is stable cardiomegaly with pulmonary venous hypertension. No adenopathy. There is aortic atherosclerosis. No bone lesions. IMPRESSION: Cardiomegaly with pulmonary vascular congestion. Mild scarring right base. Mild atelectasis left base. No appreciable pulmonary edema. Aortic Atherosclerosis (ICD10-I70.0). Electronically Signed   By: Lowella Grip III M.D.   On: 02/17/2020  07:46   DG Chest Port 1 View  Result Date: 02/14/2020 CLINICAL DATA:  Shortness of breath and fluid retention. EXAM: PORTABLE CHEST 1 VIEW COMPARISON:  PA and lateral chest 02/08/2020 and 05/20/2019. FINDINGS: There is marked cardiomegaly and pulmonary vascular congestion. No consolidative process, pneumothorax or effusion. Atherosclerosis is noted. No acute or focal bony abnormality. IMPRESSION: Cardiomegaly and vascular congestion. Atherosclerosis. Electronically Signed   By: Inge Rise M.D.   On: 02/14/2020 17:45   ECHOCARDIOGRAM COMPLETE  Result Date: 02/16/2020    ECHOCARDIOGRAM REPORT   Patient Name:   LINH HEDBERG Date of Exam: 02/16/2020 Medical Rec #:  [id number]             Height:       66.0 in Accession #:    [id number]            Weight:       266.1 lb Date  of Birth:  07-21-1961            BSA:          2.26 m Patient Age:    39 years              BP:           126/89 mmHg Patient Gender: F                     HR:           61 bpm. Exam Location:  [facility] Procedure: 2D Echo Indications:    Dyspnea 786.09 / R06.00  History:        Patient has prior history of Echocardiogram examinations, most                 recent 09/22/2017. CHF, Stroke and COPD; Risk Factors:Current                 Smoker, Diabetes and Hypertension. Crack cocaine use, RBBB,                 Acute renal failure , LVH , Acute respiratory failure with                 hypoxia.  Sonographer:    Leavy Cella RDCS (AE) Referring Phys: [id number] [place]inah Quarry IMPRESSIONS  1. Left ventricular ejection fraction, by estimation, is 55 to 60%. The left ventricle has normal function. The left ventricle has no regional wall motion abnormalities. There is mild concentric left ventricular hypertrophy. Left ventricular diastolic parameters are indeterminate. Elevated left ventricular end-diastolic pressure.  2. Right ventricular systolic function is severely reduced. The right ventricular size is moderately enlarged. mildly  increased right ventricular wall thickness. There is moderately elevated pulmonary artery systolic pressure.  3. Left atrial size was severely dilated.  4. Right atrial size was severely dilated.  5. The mitral valve is grossly normal. Mild mitral valve regurgitation.  6. Tricuspid valve regurgitation is moderate.  7. The aortic valve is tricuspid. Aortic valve regurgitation is not visualized. No aortic stenosis is present.  8. The inferior vena cava is dilated in size with >50% respiratory variability, suggesting right atrial pressure of 8 mmHg. FINDINGS  Left Ventricle: Left ventricular ejection fraction, by estimation, is 55 to 60%. The left ventricle has normal function. The left ventricle has no regional wall motion abnormalities. The left ventricular internal cavity size was normal in size. There is  mild concentric left ventricular hypertrophy. Left ventricular diastolic parameters are indeterminate. Elevated left ventricular end-diastolic pressure. Right Ventricle: The right ventricular size is moderately enlarged. Mildly increased right ventricular wall thickness. Right ventricular systolic function is severely reduced. There is moderately elevated pulmonary artery systolic pressure. The tricuspid  regurgitant velocity is 3.12 m/s, and with an assumed right atrial pressure of 10 mmHg, the estimated right ventricular systolic pressure is 81.1 mmHg. Left Atrium: Left atrial size was severely dilated. Right Atrium: Right atrial size was severely dilated. Pericardium: There is no evidence of pericardial effusion. Mitral Valve: The mitral valve is grossly normal. Mild mitral annular calcification. Mild mitral valve regurgitation. Tricuspid Valve: The tricuspid valve is grossly normal. Tricuspid valve regurgitation is moderate. Aortic Valve: The aortic valve is tricuspid. Aortic valve regurgitation is not visualized. No aortic stenosis is present. Pulmonic Valve: The pulmonic valve was grossly normal. Pulmonic  valve regurgitation is not visualized. Aorta: The aortic root is normal in size and structure. Venous: The inferior vena  cava is dilated in size with greater than 50% respiratory variability, suggesting right atrial pressure of 8 mmHg. IAS/Shunts: No atrial level shunt detected by color flow Doppler.  LEFT VENTRICLE PLAX 2D LVIDd:         4.54 cm  Diastology LVIDs:         2.77 cm  LV e' lateral:   11.20 cm/s LV PW:         1.40 cm  LV E/e' lateral: 7.8 LV IVS:        1.16 cm  LV e' medial:    4.24 cm/s LVOT diam:     2.10 cm  LV E/e' medial:  20.7 LV SV Index:   26.96 LVOT Area:     3.46 cm  RIGHT VENTRICLE RV S prime:     8.05 cm/s LEFT ATRIUM              Index       RIGHT ATRIUM           Index LA diam:        4.40 cm  1.95 cm/m  RA Area:     41.10 cm LA Vol (A2C):   143.0 ml 63.34 ml/m RA Volume:   176.00 ml 77.95 ml/m LA Vol (A4C):   99.1 ml  43.89 ml/m LA Biplane Vol: 121.0 ml 53.59 ml/m   AORTA Ao Root diam: 3.40 cm MITRAL VALVE               TRICUSPID VALVE MV Area (PHT): 4.06 cm    TR Peak grad:   38.9 mmHg MV Decel Time: 187 msec    TR Vmax:        312.00 cm/s MR Peak grad: 62.1 mmHg MR Vmax:      394.00 cm/s  SHUNTS MV E velocity: 87.80 cm/s  Systemic Diam: 2.10 cm MV A velocity: 47.50 cm/s MV E/A ratio:  1.85 Kate Sable MD Electronically signed by Kate Sable MD Signature Date/Time: 02/16/2020/4:32:20 PM    Final    US Abdomen Limited RUQ  Result Date: 02/16/2020 CLINICAL DATA:  Bilirubinemia EXAM: ULTRASOUND ABDOMEN LIMITED RIGHT UPPER QUADRANT COMPARISON:  None. FINDINGS: Gallbladder: No gallstones or wall thickening visualized. No sonographic Murphy sign noted by sonographer. Common bile duct: Diameter: 3 mm Liver: No focal lesion identified. Within normal limits in parenchymal echogenicity. Portal vein is patent on color Doppler imaging with normal direction of blood flow towards the liver. IMPRESSION: Negative right upper quadrant ultrasound. Electronically Signed   By:  Monte Fantasia M.D.   On: 02/16/2020 10:40     CBC Recent Labs  Lab 02/14/20 1709 02/15/20 1312 02/16/20 0319 02/17/20 0347  WBC 4.6 5.3 5.3 5.8  HGB 11.6* 11.2* 11.3* 11.1*  HCT 40.2 39.1 39.8 40.1  PLT 108* 100* 87* 84*  MCV 94.8 96.1 95.4 98.3  MCH 27.4 27.5 27.1 27.2  MCHC 28.9* 28.6* 28.4* 27.7*  RDW 20.5* 20.8* 20.6* 21.2*  LYMPHSABS 1.1  --   --   --   MONOABS 0.3  --   --   --   EOSABS 0.0  --   --   --   BASOSABS 0.0  --   --   --     Chemistries  Recent Labs  Lab 02/14/20 1709 02/15/20 1312 02/16/20 0319 02/17/20 0347 02/18/20 0935  NA 141 142 141 143 139  K 2.7* 2.6* 3.4* 3.9 4.6  CL 103 105 105 101 100  CO2 24 24 25  27 28  GLUCOSE 156* 116* 111* 132* 126*  BUN 36* 35* 33* 34* 40*  CREATININE 2.02* 2.02* 1.71* 1.74* 2.03*  CALCIUM 7.7* 7.6* 8.0* 8.1* 8.4*  MG  --  1.4*  --   --   --   AST _0 --   ALT _1 --   ALKPHOS 196* 193* 194* 182*  --   BILITOT 2.2* 2.3* 2.5* 1.8*  --    ------------------------------------------------------------------------------------------------------------------ No results for input(s): CHOL, HDL, LDLCALC, TRIG, CHOLHDL, LDLDIRECT in the last 72 hours.  Lab Results  Component Value Date   HGBA1C 7.5 (H) 02/15/2020   ------------------------------------------------------------------------------------------------------------------ No results for input(s): TSH, T4TOTAL, T3FREE, THYROIDAB in the last 72 hours.  Invalid input(s): FREET3 ------------------------------------------------------------------------------------------------------------------ No results for input(s): VITAMINB12, FOLATE, FERRITIN, TIBC, IRON, RETICCTPCT in the last 72 hours.  Coagulation profile Recent Labs  Lab 02/14/20 1709 02/15/20 1312  INR 2.9* 3.1*    No results for input(s): DDIMER in the last 72 hours.  Cardiac Enzymes No results for input(s): CKMB, TROPONINI, MYOGLOBIN in the last 168 hours.  Invalid input(s):  CK ------------------------------------------------------------------------------------------------------------------    Component Value Date/Time   BNP 454.0 (H) 02/15/2020 1312   Roxan Hockey M.D on 02/18/2020 at 7:52 PM  Go to [email or web address] - for contact info  [facility] - Office  [phone]

## 2020-02-18 NOTE — Progress Notes (Signed)
Physical Therapy Treatment Patient Details Name: Tracy [name] MRN: [id number] DOB: [birth year] Today's Date: 02/18/2020    History of Present Illness Tracy [name]  is a 59 y.o. female past medical history relevant for medication lifestyle noncompliance, tobacco abuse, cocaine use (in remission), HTN, DM 2, COPD, history of prior stroke, history of right lower extremity DVT on chronic anticoagulation, HFpEF and morbid obesity who presents to the ED with worsening shortness of breath and 20 pound weight gain over the last couple of weeks    PT Comments    Patient demonstrates slow labored movement for sitting up at bedside, presents on room air with SpO2 dropping from 93% to below 85% with exertion while completing BLE exercises seated at bedside, put on 3 LPM for gait training, requires occasional standing rest breaks during ambulation secondary to fatigue/SOB, had difficulty with stand to sitting in chair resulting in flopping into chair due to not reaching behind for armrest.  Patient tolerated sitting up in chair after therapy with SpO2 at 93% while on 2.5 LPM O2 - RN notified.  Patient will benefit from continued physical therapy in hospital and recommended venue below to increase strength, balance, endurance for safe ADLs and gait.    Follow Up Recommendations  Home health PT;Supervision for mobility/OOB;Supervision - Intermittent     Equipment Recommendations  None recommended by PT    Recommendations for Other Services       Precautions / Restrictions Precautions Precautions: Fall Restrictions Weight Bearing Restrictions: No    Mobility  Bed Mobility Overal bed mobility: Needs Assistance Bed Mobility: Supine to Sit     Supine to sit: Supervision;HOB elevated     General bed mobility comments: increased time, labored movement, head of bed raised  Transfers Overall transfer level: Needs assistance Equipment used: Rolling walker (2 wheeled) Transfers: Sit  to/from [facility] Sit to Stand: Supervision Stand pivot transfers: Supervision;Min guard       General transfer comment: had diffiuclty with stand to sitting secondary to not reaching behind, flopped into chair  Ambulation/Gait Ambulation/Gait assistance: Supervision;Min guard Gait Distance (Feet): 60 Feet Assistive device: Rolling walker (2 wheeled) Gait Pattern/deviations: Decreased step length - right;Decreased step length - left;Decreased stride length Gait velocity: decreased   General Gait Details: slow labored cadence with frequent standing rest breaks due to SOB, limited secondary to c/o fatigue, on 3 LPM with SpO2 at 88% after ambulating   Stairs             Wheelchair Mobility    Modified Rankin (Stroke Patients Only)       Balance Overall balance assessment: Needs assistance Sitting-balance support: Feet supported;No upper extremity supported Sitting balance-Leahy Scale: Good Sitting balance - Comments: seated at EOB   Standing balance support: During functional activity;Bilateral upper extremity supported Standing balance-Leahy Scale: Fair Standing balance comment: using RW                            Cognition Arousal/Alertness: Awake/alert Behavior During Therapy: WFL for tasks assessed/performed Overall Cognitive Status: Within Functional Limits for tasks assessed                                        Exercises General Exercises - Lower Extremity Long Arc Quad: Seated;AROM;Strengthening;Both;10 reps Hip Flexion/Marching: Seated;AROM;Strengthening;Both;10 reps Toe Raises: Seated;AROM;Strengthening;Both;15 reps Heel Raises: Seated;AROM;Strengthening;Both;15 reps  General Comments        Pertinent Vitals/Pain Pain Assessment: No/denies pain    Home Living                      Prior Function            PT Goals (current goals can now be found in the care plan section) Acute Rehab  PT Goals Patient Stated Goal: return home with family to assist PT Goal Formulation: With patient Time For Goal Achievement: 02/23/20 Potential to Achieve Goals: Good Progress towards PT goals: Progressing toward goals    Frequency    Min 3X/week      PT Plan      Co-evaluation              AM-PAC PT "6 Clicks" Mobility   Outcome Measure  Help needed turning from your back to your side while in a flat bed without using bedrails?: None Help needed moving from lying on your back to sitting on the side of a flat bed without using bedrails?: A Little Help needed moving to and from a bed to a chair (including a wheelchair)?: A Little Help needed standing up from a chair using your arms (e.g., wheelchair or bedside chair)?: A Little Help needed to walk in hospital room?: A Little Help needed climbing 3-5 steps with a railing? : A Lot 6 Click Score: 18    End of Session Equipment Utilized During Treatment: Oxygen;Gait belt Activity Tolerance: Patient tolerated treatment well;Patient limited by fatigue Patient left: in chair;with call bell/phone within reach Nurse Communication: Mobility status PT Visit Diagnosis: Unsteadiness on feet (R26.81);Other abnormalities of gait and mobility (R26.89);Muscle weakness (generalized) (M62.81)     Time: [id number] PT Time Calculation (min) (ACUTE ONLY): 33 min  Charges:  $Gait Training: 8-22 mins $Therapeutic Exercise: 8-22 mins                     1:51 PM, 02/18/20 Lonell Grandchild, MPT Physical Therapist with [facility] 336 [phone] office [phone] mobile phone

## 2020-02-18 NOTE — Care Management Important Message (Signed)
Important Message  Patient Details  Name: Tracy [name] MRN: [id number] Date of Birth: 25-Jan-1961   Medicare Important Message Given:  Yes     [name] 02/18/2020, 2:46 PM

## 2020-02-19 DIAGNOSIS — I5033 Acute on chronic diastolic (congestive) heart failure: Secondary | ICD-10-CM

## 2020-02-19 DIAGNOSIS — I480 Paroxysmal atrial fibrillation: Secondary | ICD-10-CM

## 2020-02-19 DIAGNOSIS — Z720 Tobacco use: Secondary | ICD-10-CM

## 2020-02-19 DIAGNOSIS — R601 Generalized edema: Secondary | ICD-10-CM

## 2020-02-19 DIAGNOSIS — I10 Essential (primary) hypertension: Secondary | ICD-10-CM

## 2020-02-19 LAB — GLUCOSE, CAPILLARY
Glucose-Capillary: 105 mg/dL — ABNORMAL HIGH (ref 70–99)
Glucose-Capillary: 129 mg/dL — ABNORMAL HIGH (ref 70–99)
Glucose-Capillary: 120 mg/dL — ABNORMAL HIGH (ref 70–99)
Glucose-Capillary: 137 mg/dL — ABNORMAL HIGH (ref 70–99)

## 2020-02-19 LAB — CBC
HCT: 40.6 % (ref 36.0–46.0)
Hemoglobin: 11.5 g/dL — ABNORMAL LOW (ref 12.0–15.0)
MCH: 27.7 pg (ref 26.0–34.0)
MCHC: 28.3 g/dL — ABNORMAL LOW (ref 30.0–36.0)
MCV: 97.8 fL (ref 80.0–100.0)
Platelets: 71 10*3/uL — ABNORMAL LOW (ref 150–400)
RBC: 4.15 MIL/uL (ref 3.87–5.11)
RDW: 21.3 % — ABNORMAL HIGH (ref 11.5–15.5)
WBC: 5.7 10*3/uL (ref 4.0–10.5)
nRBC: 0.4 % — ABNORMAL HIGH (ref 0.0–0.2)

## 2020-02-19 LAB — RENAL FUNCTION PANEL
Albumin: 2.9 g/dL — ABNORMAL LOW (ref 3.5–5.0)
Anion gap: 11 (ref 5–15)
BUN: 43 mg/dL — ABNORMAL HIGH (ref 6–20)
CO2: 28 mmol/L (ref 22–32)
Calcium: 8.5 mg/dL — ABNORMAL LOW (ref 8.9–10.3)
Chloride: 101 mmol/L (ref 98–111)
Creatinine, Ser: 1.96 mg/dL — ABNORMAL HIGH (ref 0.44–1.00)
GFR calc Af Amer: 32 mL/min — ABNORMAL LOW (ref >60)
GFR calc non Af Amer: 27 mL/min — ABNORMAL LOW (ref >60)
Glucose, Bld: 96 mg/dL (ref 70–99)
Phosphorus: 4.5 mg/dL (ref 2.5–4.6)
Potassium: 5.3 mmol/L — ABNORMAL HIGH (ref 3.5–5.1)
Sodium: 140 mmol/L (ref 135–145)

## 2020-02-19 MED ORDER — PREDNISONE 20 MG PO TABS
40.0000 mg | ORAL_TABLET | Freq: Every day | ORAL | Status: DC
  Administered 2020-02-19 – 2020-02-20 (×2): 40 mg via ORAL
  Filled 2020-02-19 (×2): qty 2

## 2020-02-19 MED ORDER — IPRATROPIUM-ALBUTEROL 0.5-2.5 (3) MG/3ML IN SOLN
3.0000 mL | RESPIRATORY_TRACT | Status: DC
  Administered 2020-02-19 – 2020-02-21 (×8): 3 mL via RESPIRATORY_TRACT
  Filled 2020-02-19 (×9): qty 3

## 2020-02-19 NOTE — Progress Notes (Signed)
Patient Demographics:    Tracy [name], is a 59 y.o. female, DOB - 02/06/1961, [id number]  Admit date - 02/15/2020   Admitting Physician Courage Denton Brick, MD  Outpatient Primary MD for the patient is Rory Percy, MD  LOS - 4   Chief Complaint  Patient presents with  . Leg Swelling  . Shortness of Breath  . abdominal swelling        Subjective:    Tracy [name] still appears to be short of breath, although she says she is feeling better -Appears to have shortness of breath with conversation -Wants to go home  Assessment  & Plan :    Principal Problem:   Acute on chronic heart failure with preserved ejection fraction (HFpEF) (HCC) Active Problems:   Tobacco abuse   Noncompliance   Essential hypertension   Diabetes ([place])   Acute exacerbation of CHF (congestive heart failure) ([place])   Acute respiratory failure with hypoxia ([place])   H/O: CVA (cerebrovascular accident)/ 2013   Diabetes mellitus type 2, uncontrolled ([place])   Rt DVT (deep venous thrombosis) -2014   Anticoagulated on Coumadin   Paroxysmal A-fib ([place])  Brief Summary:- 59 y.o. female past medical history relevant for medication lifestyle noncompliance, tobacco abuse, cocaine use (in remission), HTN, DM 2, COPD, history of prior stroke, history of right lower extremity DVT on chronic anticoagulation, HFpEF and morbid obesity admitted 12/14/2020 with anasarca in the setting of acute on chronic CHF exacerbation  A/p 1)HFpEF--- acute on chronic diastolic dysfunction CHF partly due to noncompliance -EF 55 to 60%, -More than 20 pound weight gain over the last couple weeks PTA-  admitted with anasarca -BNP from 02/14/2020 and 02/15/2020 elevated, chest x-ray from 02/14/2020 consistent with CHF -Continue IV Lasix 60 mg every 8 hours -Fluid balance remains negative hypoxia improving now requiring 3 L/min of oxygen -Weight  appears to be inaccurate -Monitor renal function and electrolytes closely with aggressive diuresis -Fluid restriction advised  2)PAFib--- patient with episodes of tachyarrhythmia / paroxysmal A. Fib--- already on Coumadin for anticoagulation due to prior DVT, -TSH is 1.9, continue metoprolol 25 mg twice daily for rate control  3)History of medication noncompliance and prior history of polysubstance abuse--- UDS negative this admission, as per patient's daughter [name], no ongoing drug use -Patient was seen at [facility] in [place] on 02/08/2020, admission was advised and patient left AMA -Patient was seen here at [facility] on 02/14/2020 she declined admission and left AMA -Patient returned 02/15/2020--attempted to leave AMA again patient's daughter Tracy [name] had to repeatedly begged patient to stay this time to be treated -Would benefit from home health referral post discharge compliance -Would benefit from outpatient referral to heart failure program  4)Acute on chronic hypoxic respiratory failure-PTA was on 2 L/min, suspect mostly due to #1 above -Currently requiring 3L of oxygen at rest -Treat as above in #1   5)AKI----acute kidney injury -worsening renal function most likely due to poor renal perfusion on the setting of worsening CHF  --- creatinine on admission= 2.0, baseline creatinine = 1.1   -creatinine has been stable at 1.9 renally adjust medications, avoid nephrotoxic agents / dehydration  / hypotension -Continue to hold lisinopril HCTZ, -Hold Metformin, decreased gabapentin to twice daily from 3 times daily  6)DM2-previously uncontrolled, hold Jardiance, hold Metformin, Use Novolog/Humalog Sliding scale insulin with Accu-Cheks/Fingersticks as ordered   7)Chronic Anticoagulation--- patient is chronically on Coumadin for prior DVT (2014), now found to have PAFib -Continue Coumadin  8) history of CVA in 2013--with prior right-sided hemiparesis, PTA patient was not compliant with  atorvastatin, continue atorvastatin , continue  Coumadin as above  9) hypokalemia/hypomagnesemia--replaced on recheck, continue to hold HCTZ,  10) elevated bilirubin and alk phos--- liver ultrasound without acute abnormalities, T bili 2.5 alk phos 194 - AST and ALT are not elevated -Outpatient GI follow-up advised  11)COPD/tobacco abuse--patient smokes more than 1.5 pack a day, continue nicotine patch, mucolytics and bronchodilators as advised -Since patient does have significant wheezing, will start scheduled nebulizer treatments.  We will also start on prednisone.  12) generalized weakness/deconditioning--PT eval appreciated recommends discharge home with home health  Disposition/Need for in-Hospital Stay- patient unable to be discharged at this time due to --- hypoxic respiratory failure due to CHF requiring further IV diuresis--oxygen requirement is still above her baseline, dyspnea persist even at rest -Not medically ready for discharge home  Code Status : Full  Family Communication:    Discussed with patient  Consults  :  Na  DVT Prophylaxis  : Coumadin- SCDs   Lab Results  Component Value Date   PLT 71 (L) 02/19/2020    Inpatient Medications  Scheduled Meds: . atorvastatin  10 mg Oral q1800  . Chlorhexidine Gluconate Cloth  6 each Topical Daily  . furosemide  60 mg Intravenous Q8H  . gabapentin  300 mg Oral BID  . insulin aspart  0-5 Units Subcutaneous QHS  . insulin aspart  0-9 Units Subcutaneous TID WC  . ipratropium-albuterol  3 mL Nebulization Q4H  . mouth rinse  15 mL Mouth Rinse BID  . metoprolol tartrate  25 mg Oral BID  . mometasone-formoterol  2 puff Inhalation BID  . nicotine  21 mg Transdermal Daily  . sodium chloride flush  3 mL Intravenous Q12H  . traZODone  100 mg Oral QHS   Continuous Infusions: . sodium chloride     PRN Meds:.sodium chloride, acetaminophen **OR** acetaminophen, albuterol, HYDROcodone-acetaminophen, ondansetron **OR**  ondansetron (ZOFRAN) IV, polyethylene glycol, sodium chloride flush    Anti-infectives (From admission, onward)   None        Objective:   Vitals:   02/19/20 0500 02/19/20 0543 02/19/20 0723 02/19/20 1400  BP:  (!) 132/104  111/71  Pulse:  83  96  Resp:    16  Temp:  98 F (36.7 C)  (!) 96.2 F (35.7 C)  TempSrc:  Axillary  Oral  SpO2:  98% 100% 97%  Weight: 122.8 kg     Height:        Wt Readings from Last 3 Encounters:  02/19/20 122.8 kg  02/14/20 117.9 kg  01/18/20 110.3 kg     Intake/Output Summary (Last 24 hours) at 02/19/2020 1832 Last data filed at 02/19/2020 1755 Gross per 24 hour  Intake 1443 ml  Output 2200 ml  Net -757 ml     Physical Exam  General exam: Alert, awake, oriented x 3 Respiratory system: Bilateral wheezing with increased respiratory effort Cardiovascular system:RRR. No murmurs, rubs, gallops. Gastrointestinal system: Abdomen is nondistended, soft and nontender. No organomegaly or masses felt. Normal bowel sounds heard. Central nervous system: Alert and oriented. No focal neurological deficits. Extremities: 1+ edema bilaterally Skin: No rashes, lesions or ulcers Psychiatry: Judgement and insight appear normal. Mood & affect appropriate.  Data Review:   Micro Results Recent Results (from the past 240 hour(s))  SARS CORONAVIRUS 2 (TAT 6-24 HRS) Nasopharyngeal Nasopharyngeal Swab     Status: None   Collection Time: 02/15/20 12:28 PM   Specimen: Nasopharyngeal Swab  Result Value Ref Range Status   SARS Coronavirus 2 NEGATIVE NEGATIVE Final    Comment: (NOTE) SARS-CoV-2 target nucleic acids are NOT DETECTED. The SARS-CoV-2 RNA is generally detectable in upper and lower respiratory specimens during the acute phase of infection. Negative results do not preclude SARS-CoV-2 infection, do not rule out co-infections with other pathogens, and should not be used as the sole basis for treatment or other patient management  decisions. Negative results must be combined with clinical observations, patient history, and epidemiological information. The expected result is Negative. Fact Sheet for Patients: SugarRoll.be Fact Sheet for Healthcare Providers: https://www.woods-mathews.com/ This test is not yet approved or cleared by the [place] FDA and  has been authorized for detection and/or diagnosis of SARS-CoV-2 by FDA under an Emergency Use Authorization (EUA). This EUA will remain  in effect (meaning this test can be used) for the duration of the COVID-19 declaration under Section 56 4(b)(1) of the Act, 21 U.S.C. section 360bbb-3(b)(1), unless the authorization is terminated or revoked sooner. Performed at [facility], [place] [address], [place], [place] [address]   MRSA PCR Screening     Status: None   Collection Time: 02/16/20  3:57 AM   Specimen: Nasal Mucosa; Nasopharyngeal  Result Value Ref Range Status   MRSA by PCR NEGATIVE NEGATIVE Final    Comment:        The GeneXpert MRSA Assay (FDA approved for NASAL specimens only), is one component of a comprehensive MRSA colonization surveillance program. It is not intended to diagnose MRSA infection nor to guide or monitor treatment for MRSA infections. Performed at [facility], [address] [place], [place] [address]     Radiology Reports DG Chest [place] 1 View  Result Date: 02/17/2020 CLINICAL DATA:  Shortness of breath EXAM: PORTABLE CHEST 1 VIEW COMPARISON:  February 14, 2020 FINDINGS: There is scarring in the right base region. There is mild atelectasis in the left base. There is stable cardiomegaly with pulmonary venous hypertension. No adenopathy. There is aortic atherosclerosis. No bone lesions. IMPRESSION: Cardiomegaly with pulmonary vascular congestion. Mild scarring right base. Mild atelectasis left base. No appreciable pulmonary edema. Aortic Atherosclerosis (ICD10-I70.0). Electronically  Signed   By: Lowella Grip III M.D.   On: 02/17/2020 07:46   DG Chest Port 1 View  Result Date: 02/14/2020 CLINICAL DATA:  Shortness of breath and fluid retention. EXAM: PORTABLE CHEST 1 VIEW COMPARISON:  PA and lateral chest 02/08/2020 and 05/20/2019. FINDINGS: There is marked cardiomegaly and pulmonary vascular congestion. No consolidative process, pneumothorax or effusion. Atherosclerosis is noted. No acute or focal bony abnormality. IMPRESSION: Cardiomegaly and vascular congestion. Atherosclerosis. Electronically Signed   By: Inge Rise M.D.   On: 02/14/2020 17:45   ECHOCARDIOGRAM COMPLETE  Result Date: 02/16/2020    ECHOCARDIOGRAM REPORT   Patient Name:   JAIDA BASURTO Date of Exam: 02/16/2020 Medical Rec #:  [id number]             Height:       66.0 in Accession #:    [id number]            Weight:       266.1 lb Date of Birth:  12-16-1961  BSA:          2.26 m Patient Age:    8 years              BP:           126/89 mmHg Patient Gender: F                     HR:           61 bpm. Exam Location:  [facility] Procedure: 2D Echo Indications:    Dyspnea 786.09 / R06.00  History:        Patient has prior history of Echocardiogram examinations, most                 recent 09/22/2017. CHF, Stroke and COPD; Risk Factors:Current                 Smoker, Diabetes and Hypertension. Crack cocaine use, RBBB,                 Acute renal failure , LVH , Acute respiratory failure with                 hypoxia.  Sonographer:    Leavy Cella RDCS (AE) Referring Phys: [id number] COURAGE EMOKPAE IMPRESSIONS  1. Left ventricular ejection fraction, by estimation, is 55 to 60%. The left ventricle has normal function. The left ventricle has no regional wall motion abnormalities. There is mild concentric left ventricular hypertrophy. Left ventricular diastolic parameters are indeterminate. Elevated left ventricular end-diastolic pressure.  2. Right ventricular systolic function is severely reduced. The  right ventricular size is moderately enlarged. mildly increased right ventricular wall thickness. There is moderately elevated pulmonary artery systolic pressure.  3. Left atrial size was severely dilated.  4. Right atrial size was severely dilated.  5. The mitral valve is grossly normal. Mild mitral valve regurgitation.  6. Tricuspid valve regurgitation is moderate.  7. The aortic valve is tricuspid. Aortic valve regurgitation is not visualized. No aortic stenosis is present.  8. The inferior vena cava is dilated in size with >50% respiratory variability, suggesting right atrial pressure of 8 mmHg. FINDINGS  Left Ventricle: Left ventricular ejection fraction, by estimation, is 55 to 60%. The left ventricle has normal function. The left ventricle has no regional wall motion abnormalities. The left ventricular internal cavity size was normal in size. There is  mild concentric left ventricular hypertrophy. Left ventricular diastolic parameters are indeterminate. Elevated left ventricular end-diastolic pressure. Right Ventricle: The right ventricular size is moderately enlarged. Mildly increased right ventricular wall thickness. Right ventricular systolic function is severely reduced. There is moderately elevated pulmonary artery systolic pressure. The tricuspid  regurgitant velocity is 3.12 m/s, and with an assumed right atrial pressure of 10 mmHg, the estimated right ventricular systolic pressure is 93.8 mmHg. Left Atrium: Left atrial size was severely dilated. Right Atrium: Right atrial size was severely dilated. Pericardium: There is no evidence of pericardial effusion. Mitral Valve: The mitral valve is grossly normal. Mild mitral annular calcification. Mild mitral valve regurgitation. Tricuspid Valve: The tricuspid valve is grossly normal. Tricuspid valve regurgitation is moderate. Aortic Valve: The aortic valve is tricuspid. Aortic valve regurgitation is not visualized. No aortic stenosis is present. Pulmonic  Valve: The pulmonic valve was grossly normal. Pulmonic valve regurgitation is not visualized. Aorta: The aortic root is normal in size and structure. Venous: The inferior vena cava is dilated in size with greater than 50% respiratory variability, suggesting right atrial pressure  of 8 mmHg. IAS/Shunts: No atrial level shunt detected by color flow Doppler.  LEFT VENTRICLE PLAX 2D LVIDd:         4.54 cm  Diastology LVIDs:         2.77 cm  LV e' lateral:   11.20 cm/s LV PW:         1.40 cm  LV E/e' lateral: 7.8 LV IVS:        1.16 cm  LV e' medial:    4.24 cm/s LVOT diam:     2.10 cm  LV E/e' medial:  20.7 LV SV Index:   26.96 LVOT Area:     3.46 cm  RIGHT VENTRICLE RV S prime:     8.05 cm/s LEFT ATRIUM              Index       RIGHT ATRIUM           Index LA diam:        4.40 cm  1.95 cm/m  RA Area:     41.10 cm LA Vol (A2C):   143.0 ml 63.34 ml/m RA Volume:   176.00 ml 77.95 ml/m LA Vol (A4C):   99.1 ml  43.89 ml/m LA Biplane Vol: 121.0 ml 53.59 ml/m   AORTA Ao Root diam: 3.40 cm MITRAL VALVE               TRICUSPID VALVE MV Area (PHT): 4.06 cm    TR Peak grad:   38.9 mmHg MV Decel Time: 187 msec    TR Vmax:        312.00 cm/s MR Peak grad: 62.1 mmHg MR Vmax:      394.00 cm/s  SHUNTS MV E velocity: 87.80 cm/s  Systemic Diam: 2.10 cm MV A velocity: 47.50 cm/s MV E/A ratio:  1.85 Kate Sable MD Electronically signed by Kate Sable MD Signature Date/Time: 02/16/2020/4:32:20 PM    Final    US Abdomen Limited RUQ  Result Date: 02/16/2020 CLINICAL DATA:  Bilirubinemia EXAM: ULTRASOUND ABDOMEN LIMITED RIGHT UPPER QUADRANT COMPARISON:  None. FINDINGS: Gallbladder: No gallstones or wall thickening visualized. No sonographic Murphy sign noted by sonographer. Common bile duct: Diameter: 3 mm Liver: No focal lesion identified. Within normal limits in parenchymal echogenicity. Portal vein is patent on color Doppler imaging with normal direction of blood flow towards the liver. IMPRESSION: Negative right  upper quadrant ultrasound. Electronically Signed   By: Monte Fantasia M.D.   On: 02/16/2020 10:40     CBC Recent Labs  Lab 02/14/20 1709 02/15/20 1312 02/16/20 0319 02/17/20 0347 02/19/20 0606  WBC 4.6 5.3 5.3 5.8 5.7  HGB 11.6* 11.2* 11.3* 11.1* 11.5*  HCT 40.2 39.1 39.8 40.1 40.6  PLT 108* 100* 87* 84* 71*  MCV 94.8 96.1 95.4 98.3 97.8  MCH 27.4 27.5 27.1 27.2 27.7  MCHC 28.9* 28.6* 28.4* 27.7* 28.3*  RDW 20.5* 20.8* 20.6* 21.2* 21.3*  LYMPHSABS 1.1  --   --   --   --   MONOABS 0.3  --   --   --   --   EOSABS 0.0  --   --   --   --   BASOSABS 0.0  --   --   --   --     Chemistries  Recent Labs  Lab 02/14/20 1709 02/14/20 1709 02/15/20 1312 02/16/20 0319 02/17/20 0347 02/18/20 0935 02/19/20 0606  NA 141   < > 142 141 143 139 140  K 2.7*   < >  2.6* 3.4* 3.9 4.6 5.3*  CL 103   < > 105 105 101 100 101  CO2 24   < > '24 25 27 28 28  '$ GLUCOSE 156*   < > 116* 111* 132* 126* 96  BUN 36*   < > 35* 33* 34* 40* 43*  CREATININE 2.02*   < > 2.02* 1.71* 1.74* 2.03* 1.96*  CALCIUM 7.7*   < > 7.6* 8.0* 8.1* 8.4* 8.5*  MG  --   --  1.4*  --   --   --   --   AST 30  --  '31 30 23  '$ --   --   ALT 29  --  '30 28 25  '$ --   --   ALKPHOS 196*  --  193* 194* 182*  --   --   BILITOT 2.2*  --  2.3* 2.5* 1.8*  --   --    < > = values in this interval not displayed.   ------------------------------------------------------------------------------------------------------------------ No results for input(s): CHOL, HDL, LDLCALC, TRIG, CHOLHDL, LDLDIRECT in the last 72 hours.  Lab Results  Component Value Date   HGBA1C 7.5 (H) 02/15/2020   ------------------------------------------------------------------------------------------------------------------ No results for input(s): TSH, T4TOTAL, T3FREE, THYROIDAB in the last 72 hours.  Invalid input(s): FREET3 ------------------------------------------------------------------------------------------------------------------ No results for  input(s): VITAMINB12, FOLATE, FERRITIN, TIBC, IRON, RETICCTPCT in the last 72 hours.  Coagulation profile Recent Labs  Lab 02/14/20 1709 02/15/20 1312  INR 2.9* 3.1*    No results for input(s): DDIMER in the last 72 hours.  Cardiac Enzymes No results for input(s): CKMB, TROPONINI, MYOGLOBIN in the last 168 hours.  Invalid input(s): CK ------------------------------------------------------------------------------------------------------------------    Component Value Date/Time   BNP 454.0 (H) 02/15/2020 1312   Kathie Dike M.D on 02/19/2020 at 6:32 PM  Go to [email or web address] - for contact info  [facility] - Office  [phone]

## 2020-02-20 LAB — CBC
HCT: 43.5 % (ref 36.0–46.0)
Hemoglobin: 12.4 g/dL (ref 12.0–15.0)
MCH: 27.6 pg (ref 26.0–34.0)
MCHC: 28.5 g/dL — ABNORMAL LOW (ref 30.0–36.0)
MCV: 96.9 fL (ref 80.0–100.0)
Platelets: 84 10*3/uL — ABNORMAL LOW (ref 150–400)
RBC: 4.49 MIL/uL (ref 3.87–5.11)
RDW: 21.4 % — ABNORMAL HIGH (ref 11.5–15.5)
WBC: 4.5 10*3/uL (ref 4.0–10.5)
nRBC: 0.7 % — ABNORMAL HIGH (ref 0.0–0.2)

## 2020-02-20 LAB — GLUCOSE, CAPILLARY
Glucose-Capillary: 164 mg/dL — ABNORMAL HIGH (ref 70–99)
Glucose-Capillary: 249 mg/dL — ABNORMAL HIGH (ref 70–99)
Glucose-Capillary: 224 mg/dL — ABNORMAL HIGH (ref 70–99)

## 2020-02-20 LAB — BASIC METABOLIC PANEL
Anion gap: 12 (ref 5–15)
BUN: 48 mg/dL — ABNORMAL HIGH (ref 6–20)
CO2: 26 mmol/L (ref 22–32)
Calcium: 8.6 mg/dL — ABNORMAL LOW (ref 8.9–10.3)
Chloride: 100 mmol/L (ref 98–111)
Creatinine, Ser: 2.02 mg/dL — ABNORMAL HIGH (ref 0.44–1.00)
GFR calc Af Amer: 31 mL/min — ABNORMAL LOW (ref >60)
GFR calc non Af Amer: 26 mL/min — ABNORMAL LOW (ref >60)
Glucose, Bld: 158 mg/dL — ABNORMAL HIGH (ref 70–99)
Potassium: 5.7 mmol/L — ABNORMAL HIGH (ref 3.5–5.1)
Sodium: 138 mmol/L (ref 135–145)

## 2020-02-20 MED ORDER — FUROSEMIDE 40 MG PO TABS
40.0000 mg | ORAL_TABLET | Freq: Two times a day (BID) | ORAL | Status: DC
  Administered 2020-02-20 – 2020-02-21 (×2): 40 mg via ORAL
  Filled 2020-02-20 (×2): qty 1

## 2020-02-20 MED ORDER — METHYLPREDNISOLONE SODIUM SUCC 125 MG IJ SOLR
60.0000 mg | Freq: Two times a day (BID) | INTRAMUSCULAR | Status: DC
  Administered 2020-02-20 – 2020-02-21 (×3): 60 mg via INTRAVENOUS
  Filled 2020-02-20 (×3): qty 2

## 2020-02-20 MED ORDER — SODIUM ZIRCONIUM CYCLOSILICATE 10 G PO PACK
10.0000 g | PACK | ORAL | Status: AC
  Administered 2020-02-20: 10 g via ORAL
  Filled 2020-02-20: qty 1

## 2020-02-20 NOTE — Progress Notes (Signed)
Patient Demographics:    Tracy [name], is a 59 y.o. female, DOB - [birth year], LTR:[id number]  Admit date - 02/15/2020   Admitting Physician Courage Denton Brick, MD  Outpatient Primary MD for the patient is Rory Percy, MD  LOS - 5   Chief Complaint  Patient presents with  . Leg Swelling  . Shortness of Breath  . abdominal swelling        Subjective:    Tracy [name] patient continues to wheeze and short of breath.  She is short of breath during conversation.  Becomes more short of breath on ambulation.  Repeatedly asking to discharge home.  Assessment  & Plan :    Principal Problem:   Acute on chronic heart failure with preserved ejection fraction (HFpEF) (HCC) Active Problems:   Tobacco abuse   Noncompliance   Essential hypertension   Diabetes ([place])   Acute exacerbation of CHF (congestive heart failure) (HCC)   Acute respiratory failure with hypoxia (HCC)   H/O: CVA (cerebrovascular accident)/ 2013   Diabetes mellitus type 2, uncontrolled ([place])   Rt DVT (deep venous thrombosis) -2014   Anticoagulated on Coumadin   Paroxysmal A-fib ([place])  Brief Summary:- 59 y.o. female past medical history relevant for medication lifestyle noncompliance, tobacco abuse, cocaine use (in remission), HTN, DM 2, COPD, history of prior stroke, history of right lower extremity DVT on chronic anticoagulation, HFpEF and morbid obesity admitted 12/14/2020 with anasarca in the setting of acute on chronic CHF exacerbation  A/p 1)HFpEF--- acute on chronic diastolic dysfunction CHF partly due to noncompliance -EF 55 to 60%, -More than 20 pound weight gain over the last couple weeks PTA-  admitted with anasarca -BNP from 02/14/2020 and 02/15/2020 elevated, chest x-ray from 02/14/2020 consistent with CHF -Overall volume status appears to be improving and approaching euvolemia -Transition Lasix to p.o. -Monitor  renal function and electrolytes closely with aggressive diuresis -Fluid restriction advised  2)PAFib--- patient with episodes of tachyarrhythmia / paroxysmal A. Fib--- already on Coumadin for anticoagulation due to prior DVT, -TSH is 1.9, continue metoprolol 25 mg twice daily for rate control  3)History of medication noncompliance and prior history of polysubstance abuse--- UDS negative this admission, as per patient's daughter [name], no ongoing drug use -Patient was seen at [facility] in [place] on 02/08/2020, admission was advised and patient left AMA -Patient was seen here at [facility] on 02/14/2020 she declined admission and left AMA -Patient returned 02/15/2020--attempted to leave AMA again patient's daughter Alyse Low had to repeatedly begged patient to stay this time to be treated -Would benefit from home health referral post discharge compliance -Would benefit from outpatient referral to heart failure program  4)Acute on chronic hypoxic respiratory failure-PTA was on 2 L/min, suspect mostly due to #1 above -Currently requiring 3L of oxygen at rest -Treat as above in #1   5)AKI----acute kidney injury -worsening renal function most likely due to poor renal perfusion on the setting of worsening CHF  --- creatinine on admission= 2.0, baseline creatinine = 1.1   -creatinine has been stable at 1.9 renally adjust medications, avoid nephrotoxic agents / dehydration  / hypotension -Continue to hold lisinopril HCTZ, -Hold Metformin, decreased gabapentin to twice daily from 3 times daily   6)DM2-previously uncontrolled, hold Jardiance, hold Metformin, Use  Novolog/Humalog Sliding scale insulin with Accu-Cheks/Fingersticks as ordered   7)Chronic Anticoagulation--- patient is chronically on Coumadin for prior DVT (2014), now found to have PAFib -Continue Coumadin  8) history of CVA in 2013--with prior right-sided hemiparesis, PTA patient was not compliant with atorvastatin, continue atorvastatin ,  continue  Coumadin as above  9) hypokalemia/hypomagnesemia--replaced on recheck, continue to hold HCTZ,  10) elevated bilirubin and alk phos--- liver ultrasound without acute abnormalities, T bili 2.5 alk phos 194 - AST and ALT are not elevated -Outpatient GI follow-up advised  11)COPD/tobacco abuse--patient smokes more than 1.5 pack a day, continue nicotine patch, mucolytics and bronchodilators as advised -Currently, patient had diminished breath sounds with poor air entry bilaterally.  She is wheezing.  Continue bronchodilators and start on IV steroids  12) generalized weakness/deconditioning--PT eval appreciated recommends discharge home with home health  Disposition/Need for in-Hospital Stay- patient unable to be discharged at this time due to --- hypoxic respiratory failure due to COPD needing IV steroids--oxygen requirement is still above her baseline, dyspnea persist even at rest -Not medically ready for discharge home  Code Status : Full  Family Communication:    Discussed with patient.  Discussed with patient's daughter over the phone.  Patient is repeatedly asking to be discharged home today.  I have advised her that I think she needs to stay in the hospital for now.  Consults  :  Na  DVT Prophylaxis  : Coumadin- SCDs   Lab Results  Component Value Date   PLT 84 (L) 02/20/2020    Inpatient Medications  Scheduled Meds: . atorvastatin  10 mg Oral q1800  . Chlorhexidine Gluconate Cloth  6 each Topical Daily  . furosemide  40 mg Oral BID  . gabapentin  300 mg Oral BID  . insulin aspart  0-5 Units Subcutaneous QHS  . insulin aspart  0-9 Units Subcutaneous TID WC  . ipratropium-albuterol  3 mL Nebulization Q4H  . mouth rinse  15 mL Mouth Rinse BID  . methylPREDNISolone (SOLU-MEDROL) injection  60 mg Intravenous Q12H  . metoprolol tartrate  25 mg Oral BID  . mometasone-formoterol  2 puff Inhalation BID  . nicotine  21 mg Transdermal Daily  . sodium chloride flush  3 mL  Intravenous Q12H  . traZODone  100 mg Oral QHS   Continuous Infusions: . sodium chloride     PRN Meds:.sodium chloride, acetaminophen **OR** acetaminophen, albuterol, HYDROcodone-acetaminophen, ondansetron **OR** ondansetron (ZOFRAN) IV, polyethylene glycol, sodium chloride flush    Anti-infectives (From admission, onward)   None        Objective:   Vitals:   02/20/20 1145 02/20/20 1355 02/20/20 1500 02/20/20 1916  BP:  137/88 (!) 157/80   Pulse:  92 80   Resp:  20 20   Temp:   98.2 F (36.8 C)   TempSrc:   Oral   SpO2: 92% 92% 94% 95%  Weight:      Height:        Wt Readings from Last 3 Encounters:  02/20/20 122.7 kg  02/14/20 117.9 kg  01/18/20 110.3 kg     Intake/Output Summary (Last 24 hours) at 02/20/2020 1929 Last data filed at 02/20/2020 1356 Gross per 24 hour  Intake 720 ml  Output 1 ml  Net 719 ml     Physical Exam  General exam: Alert, awake, oriented x 3 Respiratory system: Diminished breath sounds with wheezing.  Increased respiratory effort Cardiovascular system:RRR. No murmurs, rubs, gallops. Gastrointestinal system: Abdomen is nondistended, soft and  nontender. No organomegaly or masses felt. Normal bowel sounds heard. Central nervous system: Alert and oriented. No focal neurological deficits. Extremities: No C/C/E, +pedal pulses Skin: No rashes, lesions or ulcers Psychiatry: Judgement and insight appear normal. Mood & affect appropriate.       Data Review:   Micro Results Recent Results (from the past 240 hour(s))  SARS CORONAVIRUS 2 (TAT 6-24 HRS) Nasopharyngeal Nasopharyngeal Swab     Status: None   Collection Time: 02/15/20 12:28 PM   Specimen: Nasopharyngeal Swab  Result Value Ref Range Status   SARS Coronavirus 2 NEGATIVE NEGATIVE Final    Comment: (NOTE) SARS-CoV-2 target nucleic acids are NOT DETECTED. The SARS-CoV-2 RNA is generally detectable in upper and lower respiratory specimens during the acute phase of infection.  Negative results do not preclude SARS-CoV-2 infection, do not rule out co-infections with other pathogens, and should not be used as the sole basis for treatment or other patient management decisions. Negative results must be combined with clinical observations, patient history, and epidemiological information. The expected result is Negative. Fact Sheet for Patients: SugarRoll.be Fact Sheet for Healthcare Providers: https://www.woods-mathews.com/ This test is not yet approved or cleared by the [place] FDA and  has been authorized for detection and/or diagnosis of SARS-CoV-2 by FDA under an Emergency Use Authorization (EUA). This EUA will remain  in effect (meaning this test can be used) for the duration of the COVID-19 declaration under Section 56 4(b)(1) of the Act, 21 U.S.C. section 360bbb-3(b)(1), unless the authorization is terminated or revoked sooner. Performed at [facility], [place] [address], [place], [place] [address]   MRSA PCR Screening     Status: None   Collection Time: 02/16/20  3:57 AM   Specimen: Nasal Mucosa; Nasopharyngeal  Result Value Ref Range Status   MRSA by PCR NEGATIVE NEGATIVE Final    Comment:        The GeneXpert MRSA Assay (FDA approved for NASAL specimens only), is one component of a comprehensive MRSA colonization surveillance program. It is not intended to diagnose MRSA infection nor to guide or monitor treatment for MRSA infections. Performed at [facility], [address], [place], [place] [address]     Radiology Reports DG Chest [place] 1 View  Result Date: 02/17/2020 CLINICAL DATA:  Shortness of breath EXAM: PORTABLE CHEST 1 VIEW COMPARISON:  February 14, 2020 FINDINGS: There is scarring in the right base region. There is mild atelectasis in the left base. There is stable cardiomegaly with pulmonary venous hypertension. No adenopathy. There is aortic atherosclerosis. No bone lesions.  IMPRESSION: Cardiomegaly with pulmonary vascular congestion. Mild scarring right base. Mild atelectasis left base. No appreciable pulmonary edema. Aortic Atherosclerosis (ICD10-I70.0). Electronically Signed   By: Lowella Grip III M.D.   On: 02/17/2020 07:46   DG Chest Port 1 View  Result Date: 02/14/2020 CLINICAL DATA:  Shortness of breath and fluid retention. EXAM: PORTABLE CHEST 1 VIEW COMPARISON:  PA and lateral chest 02/08/2020 and 05/20/2019. FINDINGS: There is marked cardiomegaly and pulmonary vascular congestion. No consolidative process, pneumothorax or effusion. Atherosclerosis is noted. No acute or focal bony abnormality. IMPRESSION: Cardiomegaly and vascular congestion. Atherosclerosis. Electronically Signed   By: Inge Rise M.D.   On: 02/14/2020 17:45   ECHOCARDIOGRAM COMPLETE  Result Date: 02/16/2020    ECHOCARDIOGRAM REPORT   Patient Name:   MICHILLE MCELRATH Date of Exam: 02/16/2020 Medical Rec #:  [id number]             Height:  66.0 in Accession #:    [id number]            Weight:       266.1 lb Date of Birth:  August 06, 1961            BSA:          2.26 m Patient Age:    12 years              BP:           126/89 mmHg Patient Gender: F                     HR:           61 bpm. Exam Location:  [facility] Procedure: 2D Echo Indications:    Dyspnea 786.09 / R06.00  History:        Patient has prior history of Echocardiogram examinations, most                 recent 09/22/2017. CHF, Stroke and COPD; Risk Factors:Current                 Smoker, Diabetes and Hypertension. Crack cocaine use, RBBB,                 Acute renal failure , LVH , Acute respiratory failure with                 hypoxia.  Sonographer:    Leavy Cella RDCS (AE) Referring Phys: [id number] COURAGE EMOKPAE IMPRESSIONS  1. Left ventricular ejection fraction, by estimation, is 55 to 60%. The left ventricle has normal function. The left ventricle has no regional wall motion abnormalities. There is mild concentric  left ventricular hypertrophy. Left ventricular diastolic parameters are indeterminate. Elevated left ventricular end-diastolic pressure.  2. Right ventricular systolic function is severely reduced. The right ventricular size is moderately enlarged. mildly increased right ventricular wall thickness. There is moderately elevated pulmonary artery systolic pressure.  3. Left atrial size was severely dilated.  4. Right atrial size was severely dilated.  5. The mitral valve is grossly normal. Mild mitral valve regurgitation.  6. Tricuspid valve regurgitation is moderate.  7. The aortic valve is tricuspid. Aortic valve regurgitation is not visualized. No aortic stenosis is present.  8. The inferior vena cava is dilated in size with >50% respiratory variability, suggesting right atrial pressure of 8 mmHg. FINDINGS  Left Ventricle: Left ventricular ejection fraction, by estimation, is 55 to 60%. The left ventricle has normal function. The left ventricle has no regional wall motion abnormalities. The left ventricular internal cavity size was normal in size. There is  mild concentric left ventricular hypertrophy. Left ventricular diastolic parameters are indeterminate. Elevated left ventricular end-diastolic pressure. Right Ventricle: The right ventricular size is moderately enlarged. Mildly increased right ventricular wall thickness. Right ventricular systolic function is severely reduced. There is moderately elevated pulmonary artery systolic pressure. The tricuspid  regurgitant velocity is 3.12 m/s, and with an assumed right atrial pressure of 10 mmHg, the estimated right ventricular systolic pressure is 13.0 mmHg. Left Atrium: Left atrial size was severely dilated. Right Atrium: Right atrial size was severely dilated. Pericardium: There is no evidence of pericardial effusion. Mitral Valve: The mitral valve is grossly normal. Mild mitral annular calcification. Mild mitral valve regurgitation. Tricuspid Valve: The tricuspid  valve is grossly normal. Tricuspid valve regurgitation is moderate. Aortic Valve: The aortic valve is tricuspid. Aortic valve regurgitation is not visualized. No aortic stenosis is  present. Pulmonic Valve: The pulmonic valve was grossly normal. Pulmonic valve regurgitation is not visualized. Aorta: The aortic root is normal in size and structure. Venous: The inferior vena cava is dilated in size with greater than 50% respiratory variability, suggesting right atrial pressure of 8 mmHg. IAS/Shunts: No atrial level shunt detected by color flow Doppler.  LEFT VENTRICLE PLAX 2D LVIDd:         4.54 cm  Diastology LVIDs:         2.77 cm  LV e' lateral:   11.20 cm/s LV PW:         1.40 cm  LV E/e' lateral: 7.8 LV IVS:        1.16 cm  LV e' medial:    4.24 cm/s LVOT diam:     2.10 cm  LV E/e' medial:  20.7 LV SV Index:   26.96 LVOT Area:     3.46 cm  RIGHT VENTRICLE RV S prime:     8.05 cm/s LEFT ATRIUM              Index       RIGHT ATRIUM           Index LA diam:        4.40 cm  1.95 cm/m  RA Area:     41.10 cm LA Vol (A2C):   143.0 ml 63.34 ml/m RA Volume:   176.00 ml 77.95 ml/m LA Vol (A4C):   99.1 ml  43.89 ml/m LA Biplane Vol: 121.0 ml 53.59 ml/m   AORTA Ao Root diam: 3.40 cm MITRAL VALVE               TRICUSPID VALVE MV Area (PHT): 4.06 cm    TR Peak grad:   38.9 mmHg MV Decel Time: 187 msec    TR Vmax:        312.00 cm/s MR Peak grad: 62.1 mmHg MR Vmax:      394.00 cm/s  SHUNTS MV E velocity: 87.80 cm/s  Systemic Diam: 2.10 cm MV A velocity: 47.50 cm/s MV E/A ratio:  1.85 Kate Sable MD Electronically signed by Kate Sable MD Signature Date/Time: 02/16/2020/4:32:20 PM    Final    US Abdomen Limited RUQ  Result Date: 02/16/2020 CLINICAL DATA:  Bilirubinemia EXAM: ULTRASOUND ABDOMEN LIMITED RIGHT UPPER QUADRANT COMPARISON:  None. FINDINGS: Gallbladder: No gallstones or wall thickening visualized. No sonographic Murphy sign noted by sonographer. Common bile duct: Diameter: 3 mm Liver: No focal  lesion identified. Within normal limits in parenchymal echogenicity. Portal vein is patent on color Doppler imaging with normal direction of blood flow towards the liver. IMPRESSION: Negative right upper quadrant ultrasound. Electronically Signed   By: Monte Fantasia M.D.   On: 02/16/2020 10:40     CBC Recent Labs  Lab 02/14/20 1709 02/14/20 1709 02/15/20 1312 02/16/20 0319 02/17/20 0347 02/19/20 0606 02/20/20 0659  WBC 4.6   < > 5.3 5.3 5.8 5.7 4.5  HGB 11.6*   < > 11.2* 11.3* 11.1* 11.5* 12.4  HCT 40.2   < > 39.1 39.8 40.1 40.6 43.5  PLT 108*   < > 100* 87* 84* 71* 84*  MCV 94.8   < > 96.1 95.4 98.3 97.8 96.9  MCH 27.4   < > 27.5 27.1 27.2 27.7 27.6  MCHC 28.9*   < > 28.6* 28.4* 27.7* 28.3* 28.5*  RDW 20.5*   < > 20.8* 20.6* 21.2* 21.3* 21.4*  LYMPHSABS 1.1  --   --   --   --   --   --  MONOABS 0.3  --   --   --   --   --   --   EOSABS 0.0  --   --   --   --   --   --   BASOSABS 0.0  --   --   --   --   --   --    < > = values in this interval not displayed.    Chemistries  Recent Labs  Lab 02/14/20 1709 02/14/20 1709 02/15/20 1312 02/15/20 1312 02/16/20 0319 02/17/20 0347 02/18/20 0935 02/19/20 0606 02/20/20 0659  NA 141   < > 142   < > 141 143 139 140 138  K 2.7*   < > 2.6*   < > 3.4* 3.9 4.6 5.3* 5.7*  CL 103   < > 105   < > 105 101 100 101 100  CO2 24   < > 24   < > '25 27 28 28 26  '$ GLUCOSE 156*   < > 116*   < > 111* 132* 126* 96 158*  BUN 36*   < > 35*   < > 33* 34* 40* 43* 48*  CREATININE 2.02*   < > 2.02*   < > 1.71* 1.74* 2.03* 1.96* 2.02*  CALCIUM 7.7*   < > 7.6*   < > 8.0* 8.1* 8.4* 8.5* 8.6*  MG  --   --  1.4*  --   --   --   --   --   --   AST 30  --  31  --  30 23  --   --   --   ALT 29  --  30  --  28 25  --   --   --   ALKPHOS 196*  --  193*  --  194* 182*  --   --   --   BILITOT 2.2*  --  2.3*  --  2.5* 1.8*  --   --   --    < > = values in this interval not displayed.    ------------------------------------------------------------------------------------------------------------------ No results for input(s): CHOL, HDL, LDLCALC, TRIG, CHOLHDL, LDLDIRECT in the last 72 hours.  Lab Results  Component Value Date   HGBA1C 7.5 (H) 02/15/2020   ------------------------------------------------------------------------------------------------------------------ No results for input(s): TSH, T4TOTAL, T3FREE, THYROIDAB in the last 72 hours.  Invalid input(s): FREET3 ------------------------------------------------------------------------------------------------------------------ No results for input(s): VITAMINB12, FOLATE, FERRITIN, TIBC, IRON, RETICCTPCT in the last 72 hours.  Coagulation profile Recent Labs  Lab 02/14/20 1709 02/15/20 1312  INR 2.9* 3.1*    No results for input(s): DDIMER in the last 72 hours.  Cardiac Enzymes No results for input(s): CKMB, TROPONINI, MYOGLOBIN in the last 168 hours.  Invalid input(s): CK ------------------------------------------------------------------------------------------------------------------    Component Value Date/Time   BNP 454.0 (H) 02/15/2020 1312   Kathie Dike M.D on 02/20/2020 at 7:29 PM  Go to [email or web address] - for contact info  [facility] - Office  [phone]

## 2020-02-20 NOTE — Progress Notes (Signed)
Ambulated patient on 2L/min which is what she uses at home, from her room to end of hallway and O2 sats dropped to 82% and HR at 150. Patient unable to speak in complete sentences without being SOB, patient very adamant about leaving the hospital with or without doctors advice/approval . Patient called her daughter to pick her up and patient's daughter is wanting patient to stay in the hospital.  Dr. Roderic Palau made aware.

## 2020-02-21 LAB — BASIC METABOLIC PANEL
Anion gap: 11 (ref 5–15)
BUN: 54 mg/dL — ABNORMAL HIGH (ref 6–20)
CO2: 28 mmol/L (ref 22–32)
Calcium: 8.8 mg/dL — ABNORMAL LOW (ref 8.9–10.3)
Chloride: 100 mmol/L (ref 98–111)
Creatinine, Ser: 2.25 mg/dL — ABNORMAL HIGH (ref 0.44–1.00)
GFR calc Af Amer: 27 mL/min — ABNORMAL LOW (ref >60)
GFR calc non Af Amer: 23 mL/min — ABNORMAL LOW (ref >60)
Glucose, Bld: 228 mg/dL — ABNORMAL HIGH (ref 70–99)
Potassium: 4.9 mmol/L (ref 3.5–5.1)
Sodium: 139 mmol/L (ref 135–145)

## 2020-02-21 LAB — GLUCOSE, CAPILLARY
Glucose-Capillary: 171 mg/dL — ABNORMAL HIGH (ref 70–99)
Glucose-Capillary: 204 mg/dL — ABNORMAL HIGH (ref 70–99)
Glucose-Capillary: 188 mg/dL — ABNORMAL HIGH (ref 70–99)

## 2020-02-21 MED ORDER — PREDNISONE 20 MG PO TABS: 40.0000 mg | ORAL_TABLET | Freq: Every day | ORAL | 0 refills | Status: DC

## 2020-02-21 MED ORDER — GLIMEPIRIDE 2 MG PO TABS: 2.0000 mg | ORAL_TABLET | Freq: Every day | ORAL | 2 refills | Status: DC

## 2020-02-21 MED ORDER — NICOTINE 21 MG/24HR TD PT24: 21.0000 mg | MEDICATED_PATCH | Freq: Every day | TRANSDERMAL | 0 refills | Status: DC

## 2020-02-21 MED ORDER — ATORVASTATIN CALCIUM 10 MG PO TABS: 10.0000 mg | ORAL_TABLET | Freq: Every evening | ORAL | 2 refills | Status: DC

## 2020-02-21 MED ORDER — ALBUTEROL SULFATE (2.5 MG/3ML) 0.083% IN NEBU: 2.5000 mg | INHALATION_SOLUTION | RESPIRATORY_TRACT | 12 refills | Status: AC | PRN

## 2020-02-21 MED ORDER — FUROSEMIDE 40 MG PO TABS: 40.0000 mg | ORAL_TABLET | Freq: Two times a day (BID) | ORAL | 2 refills | Status: DC

## 2020-02-21 MED ORDER — METOPROLOL TARTRATE 25 MG PO TABS: 25.0000 mg | ORAL_TABLET | Freq: Two times a day (BID) | ORAL | 2 refills | Status: DC

## 2020-02-21 MED ORDER — GUAIFENESIN ER 600 MG PO TB12: 600.0000 mg | ORAL_TABLET | Freq: Two times a day (BID) | ORAL | 1 refills | Status: DC

## 2020-02-21 MED ORDER — TRAZODONE HCL 100 MG PO TABS: 100.0000 mg | ORAL_TABLET | Freq: Every day | ORAL | 2 refills | Status: DC

## 2020-02-21 MED ORDER — GABAPENTIN 100 MG PO CAPS: 300.0000 mg | ORAL_CAPSULE | Freq: Two times a day (BID) | ORAL | 2 refills | Status: DC

## 2020-02-21 MED ORDER — JARDIANCE 25 MG PO TABS: 25.0000 mg | ORAL_TABLET | Freq: Every day | ORAL | 3 refills | Status: DC

## 2020-02-21 MED ORDER — ACETAMINOPHEN 325 MG PO TABS: 650.0000 mg | ORAL_TABLET | Freq: Four times a day (QID) | ORAL | 0 refills | Status: AC | PRN

## 2020-02-21 MED ORDER — BUDESONIDE-FORMOTEROL FUMARATE 160-4.5 MCG/ACT IN AERO: 2.0000 | INHALATION_SPRAY | Freq: Two times a day (BID) | RESPIRATORY_TRACT | 12 refills | Status: AC

## 2020-02-21 MED ORDER — DOXYCYCLINE HYCLATE 100 MG PO TABS: 100.0000 mg | ORAL_TABLET | Freq: Two times a day (BID) | ORAL | 0 refills | Status: AC

## 2020-02-21 MED ORDER — GUAIFENESIN ER 600 MG PO TB12
600.0000 mg | ORAL_TABLET | Freq: Two times a day (BID) | ORAL | Status: DC
  Administered 2020-02-21: 09:00:00 600 mg via ORAL
  Filled 2020-02-21: qty 1

## 2020-02-21 MED ORDER — ALBUTEROL SULFATE HFA 108 (90 BASE) MCG/ACT IN AERS: 2.0000 | INHALATION_SPRAY | RESPIRATORY_TRACT | 2 refills | Status: AC | PRN

## 2020-02-21 NOTE — TOC Transition Note (Signed)
Transition of Care [facility]) - CM/SW Discharge Note   Patient Details  Name: IMMACULATE BASILIO MRN: [id number] Date of Birth: [birth year]  [facility] of [facility]) CM/SW Contact:  Boneta Lucks, RN Phone Number: 02/21/2020, 9:14 AM   Clinical Narrative:   Patient discharging home today. [facility] updated for HHPT.    Final next level of care: [place] Barriers to Discharge: Barriers Resolved   Patient Goals and CMS Choice Patient states their goals for this hospitalization and ongoing recovery are:: to go home. CMS Medicare.gov Compare Post Acute Care list provided to:: Patient Choice offered to / list presented to : Patient  Discharge Placement        Patient and family notified of of transfer: 02/21/20  Discharge Plan and Services         DME Agency: [facility] Arranged: PT   Readmission Risk Interventions Readmission Risk Prevention Plan 02/18/2020  Transportation Screening Complete  Medication Review [employer]) Complete  PCP or Specialist appointment within 3-5 days of discharge Not Complete  HRI or Home Care Consult Complete  SW Recovery Care/Counseling Consult Complete  Palliative Care Screening Not Complete  Skilled [place] Not Complete  Some recent data might be hidden

## 2020-02-21 NOTE — Progress Notes (Signed)
IV removed and DC instructions reviewed with patient and daughter.  Scripts sent to pharnacy and paper script given for doxy.  Taken by [facility] to entrance. Daughter to drive home.

## 2020-02-21 NOTE — Care Management Important Message (Signed)
Important Message  Patient Details  Name: Tracy [name] MRN: [id number] Date of Birth: 01-26-1961   Medicare Important Message Given:  Yes     [name] 02/21/2020, 1:17 PM

## 2020-02-21 NOTE — Care Management Important Message (Signed)
Important Message  Patient Details  Name: Tracy [name] MRN: [id number] Date of Birth: November 24, 1961   Medicare Important Message Given:  Yes     [name] 02/21/2020, 1:21 PM

## 2020-02-21 NOTE — Discharge Instructions (Signed)
1)Very low-salt diet advised 2)Weigh yourself daily, call if you gain more than 3 pounds in 1 day or more than 5 pounds in 1 week as your diuretic medications may need to be adjusted 3)Limit your Fluid  intake to no more than 60 ounces (1.8 Liters) per day 4)Your  Lasix/furosemide has been adjusted 5)There has been several changes to your medications ---please pay attention to these changes. Please take medications as prescribed at this time,  not as you did  prior to coming to the hospital 6) please recheck your PT/INR around Friday, 02/25/2020 and have your Coumadin/warfarin dose adjusted 7)Avoid ibuprofen/Advil/Aleve/Motrin/Goody Powders/Naproxen/BC powders/Meloxicam/Diclofenac/Indomethacin and other Nonsteroidal anti-inflammatory medications as these will make you more likely to bleed and can cause stomach ulcers, can also cause Kidney problems.  8) smoking cessation strongly advised, please use nicotine patch to help you quit smoking 9) please use oxygen via nasal cannula at all times--- you need at least 2 L of oxygen while resting and up to 3 L of oxygen while up and about/ambulating 10) you need to see a kidney doctor/nephrologist to help monitor your kidney function which is currently challenged 11)Again please note that there have been several changes to your medications 12)Follow-up with PCP around Friday, 02/25/2020 for recheck, reevaluation and PT/INR check as well as BMP Blood test

## 2020-02-21 NOTE — Discharge Summary (Signed)
Tracy [name], is a 59 y.o. female  DOB 1961-03-04  MRN [id number].  Admission date:  02/15/2020  Admitting Physician  Roxan Hockey, MD  Discharge Date:  02/21/2020   Primary MD  Rory Percy, MD  Recommendations for primary care physician for things to follow:   1)Very [name]-salt diet advised 2)Weigh yourself daily, call if you gain more than 3 pounds in 1 day or more than 5 pounds in 1 week as your diuretic medications may need to be adjusted 3)Limit your Fluid  intake to no more than 60 ounces (1.8 Liters) per day 4)Your  Lasix/furosemide has been adjusted 5)There has been several changes to your medications ---please pay attention to these changes. Please take medications as prescribed at this time,  not as you did  prior to coming to the hospital 6) please recheck your PT/INR around Friday, 02/25/2020 and have your Coumadin/warfarin dose adjusted 7)Avoid ibuprofen/Advil/Aleve/Motrin/Goody Powders/Naproxen/BC powders/Meloxicam/Diclofenac/Indomethacin and other Nonsteroidal anti-inflammatory medications as these will make you more likely to bleed and can cause stomach ulcers, can also cause Kidney problems.  8) smoking cessation strongly advised, please use nicotine patch to help you quit smoking 9) please use oxygen via nasal cannula at all times--- you need at least 2 L of oxygen while resting and up to 3 L of oxygen while up and about/ambulating 10) you need to see a kidney doctor/nephrologist to help monitor your kidney function which is currently challenged 11)Again please note that there have been several changes to your medications 12)Follow-up with PCP around Friday, 02/25/2020 for recheck, reevaluation and PT/INR check as well as BMP Blood test  Admission Diagnosis  Hypokalemia [E87.6] Bilirubinemia [E80.6] Anasarca [R60.1] Acute exacerbation of CHF (congestive heart failure) ([place])  [I50.9]  Discharge Diagnosis  Hypokalemia [E87.6] Bilirubinemia [E80.6] Anasarca [R60.1] Acute exacerbation of CHF (congestive heart failure) ([place]) [I50.9]    Principal Problem:   Acute on chronic heart failure with preserved ejection fraction (HFpEF) ([place]) Active Problems:   Acute exacerbation of CHF (congestive heart failure) ([facility])   Paroxysmal A-fib ([place])   Tobacco abuse   Noncompliance   Essential hypertension   Diabetes ([place])   Acute respiratory failure with hypoxia ([place])   H/O: CVA (cerebrovascular accident)/ 2013   Diabetes mellitus type 2, uncontrolled ([place])   Rt DVT (deep venous thrombosis) -2014   Anticoagulated on Coumadin      Past Medical History:  Diagnosis Date  . Acute ischemic stroke ([place]) 07/03/2012  . Crack cocaine use   . Diabetes ([place])   . Diabetes mellitus ([place])   . DVT (deep venous thrombosis) ([place])   . Hemiparesis, acute ([place]) 07/04/2012  . Hypertension   . LVH (left ventricular hypertrophy) 07/04/2012   Ejection fraction 60%.  . Myocardial infarct, old   . Thrombocytopenia ([place]) 07/03/2012  . Tobacco abuse     Past Surgical History:  Procedure Laterality Date  . ABDOMINAL HYSTERECTOMY    . HERNIA REPAIR         HPI  from the history and physical  done on the day of admission:    Tracy [name]  is a 59 y.o. female past medical history relevant for medication lifestyle noncompliance, tobacco abuse, cocaine use (in remission), HTN, DM 2, COPD, history of prior stroke, history of right lower extremity DVT on chronic anticoagulation, HFpEF and morbid obesity who presents to the ED with worsening shortness of breath and 20 pound weight gain over the last couple of weeks -Denies chest pains, denies pleuritic symptoms -Patient has significant lower extremity and abdominal wall swelling/anasarca -No fever no chills, no nausea no vomiting or diarrhea, -- Patient was seen at [facility] in [place] on 02/08/2020, admission was advised and patient left  AMA -Patient was seen here at [facility] on 02/14/2020 she declined admission and left AMA -Patient returned 02/15/2020--attempted to leave AMA again patient's daughter Tracy [name] had to repeatedly begged patient to stay this time to be treated  -I spoke with patient's daughter and got additional history  -BNP from 02/14/2020 and 02/15/2020 elevated, chest x-ray from 02/14/2020 consistent with CHF  -Patient received IV Lasix 80 mg x 1 in the ED with good urine output,  Elevated creatinine noted   Hospital Course:    Brief Summary:- 59 y.o.femalepast medical history relevant for medication lifestyle noncompliance, tobacco abuse, cocaine use(in remission),HTN, DM 2, COPD, history of prior stroke, history of right lower extremity DVT on chronic anticoagulation, HFpEFand morbid obesity admitted 12/14/2020 with anasarca in the setting of acute on chronic CHF exacerbation -Patient is notoriously noncompliant unless previously left AMA  A/p 1)HFpEF---acute on chronic diastolic dysfunction CHF partly due to noncompliance -EF 55 to 60%, -More than 20 pound weight gain over the last couple weeks PTA- admitted with anasarca -BNP from 02/14/2020 and 02/15/2020 elevated, chest x-ray from 02/14/2020 consistent with CHF -Overall volume status improved, approaching euvolemia -Transitioned to po Lasix . --Fluid restriction advised -2 g sodium diet advised -Outpatient follow-up with cardiology strongly advised  2)PAFib--- patient with episodes of tachyarrhythmia / paroxysmal A. Fib--- already on Coumadin for anticoagulation due to prior DVT, -TSH is 1.9, continue metoprolol 25 mg twice daily for rate control  3)History of medication noncompliance and prior history of polysubstance abuse---UDS negative this admission,as per patient's daughter Tracy [name],no ongoing drug use at this time -Patient was seen at [facility] in [place] on 02/08/2020,admission was advised and patient left AMA -Patient was seen  here at [place] on 02/14/2020 she declined admission and left AMA -Patient returned 02/15/2020--attempted to leave AMA again patient's daughter Tracy [name] had to repeatedly begged patient to stay this time to be treated --Home health RN to improve medication lifestyle compliance -Outpatient follow-up with cardiology to improve compliance  4)Acute on chronic hypoxic respiratory failure-PTA was on 2 L/min,suspect mostly due to #1 above -Currently requiring 3L of oxygen at rest -Treat as above in #1   5)AKI----acute kidney injury -worsening renal function most likely due to poor renal perfusion on the setting of worsening CHF---creatinine on admission= 2.0, baseline creatinine =1.1-creatinine has been stable around 2  -renally adjustED medications, avoid nephrotoxic agents / dehydration / hypotension  6)DM2-previously uncontrolled, -discharge on Jardiance and Amaryl -Metformin discontinued due to kidney function  7)ChronicAnticoagulation---patient is chronically on Coumadin for prior DVT (2014), now found to have PAFib -Continue Coumadin  8)history of CVA in 2013--with prior right-sided hemiparesis, PTA patient was not compliant with atorvastatin, continue atorvastatin , continue  Coumadin as above  9)hypokalemia/hypomagnesemia--replaced on recheck, discontinued HCTZ,  10)elevated bilirubin and alk phos---liver ultrasound without acute abnormalities, T bili 2.5 alk phos  194 - AST and ALT are not elevated -Outpatient GI follow-up advised  11)COPD/tobacco abuse--patient smokes more than 1.5 pack a day, continue nicotine patch, mucolytics and bronchodilators as advised -Improved with IV Solu-Medrol, okay to discharge on p.o. prednisone  12) generalized weakness/deconditioning--PT eval appreciated recommends discharge home with home health  Disposition--discharge home with home health services  Code Status : Full  Family Communication:    Discussed with patient.   Discussed with patient's daughter at bedside  Consults  :  Na  Discharge Condition: Stable  Follow UP--outpatient cardiology  Follow-up Information    [facility] Follow up.   Why: PT       Rory Percy, MD. Schedule an appointment as soon as possible for a visit in 4 day(s).   Specialty: Family Medicine Why: Needs PT/INR and repeat BMP test Contact information: [place] [address] [phone]        Herminio Commons, MD .   Specialty: Cardiology Contact information: [place] [address] [phone]        Rexene Agent, MD. Schedule an appointment as soon as possible for a visit in 1 week(s).   Specialty: Nephrology Why: Worsening kidney function Contact information: [place] [phone]           Diet and Activity recommendation:  As advised  Discharge Instructions    Discharge Instructions    Call MD for:  difficulty breathing, headache or visual disturbances   Complete by: As directed    Call MD for:  persistant dizziness or light-headedness   Complete by: As directed    Call MD for:  persistant nausea and vomiting   Complete by: As directed    Call MD for:  severe uncontrolled pain   Complete by: As directed    Call MD for:  temperature >100.4   Complete by: As directed    Diet - [name] sodium heart healthy   Complete by: As directed    Diet Carb Modified   Complete by: As directed    Discharge instructions   Complete by: As directed    1)Very [name]-salt diet advised 2)Weigh yourself daily, call if you gain more than 3 pounds in 1 day or more than 5 pounds in 1 week as your diuretic medications may need to be adjusted 3)Limit your Fluid  intake to no more than 60 ounces (1.8 Liters) per day 4)Your  Lasix/furosemide has been adjusted 5)There has been several changes to your medications ---please pay attention to these changes. Please take medications as prescribed at this time,   not as you did  prior to coming to the hospital 6) please recheck your PT/INR around Friday, 02/25/2020 and have your Coumadin/warfarin dose adjusted 7)Avoid ibuprofen/Advil/Aleve/Motrin/Goody Powders/Naproxen/BC powders/Meloxicam/Diclofenac/Indomethacin and other Nonsteroidal anti-inflammatory medications as these will make you more likely to bleed and can cause stomach ulcers, can also cause Kidney problems.  8) smoking cessation strongly advised, please use nicotine patch to help you quit smoking 9) please use oxygen via nasal cannula at all times--- you need at least 2 L of oxygen while resting and up to 3 L of oxygen while up and about/ambulating 10) you need to see a kidney doctor/nephrologist to help monitor your kidney function which is currently challenged 11)Again please note that there have been several changes to your medications 12)Follow-up with PCP around Friday, 02/25/2020 for recheck, reevaluation and PT/INR check as well as BMP Blood test  Increase activity slowly   Complete by: As directed         Discharge Medications     Allergies as of 02/21/2020   No Known Allergies     Medication List    STOP taking these medications   hydrALAZINE 25 MG tablet Commonly known as: APRESOLINE   lisinopril-hydrochlorothiazide 20-12.5 MG tablet Commonly known as: ZESTORETIC   metFORMIN 500 MG 24 hr tablet Commonly known as: GLUCOPHAGE-XR   potassium chloride SA 20 MEQ tablet Commonly known as: KLOR-CON     TAKE these medications   acetaminophen 325 MG tablet Commonly known as: TYLENOL Take 2 tablets (650 mg total) by mouth every 6 (six) hours as needed for mild pain (or Fever >/= 101).   albuterol 108 (90 Base) MCG/ACT inhaler Commonly known as: VENTOLIN HFA Inhale 2 puffs into the lungs every 4 (four) hours as needed for wheezing or shortness of breath. What changed: Another medication with the same name was added. Make sure you understand how and when to take each.    albuterol (2.5 MG/3ML) 0.083% nebulizer solution Commonly known as: PROVENTIL Take 3 mLs (2.5 mg total) by nebulization every 4 (four) hours as needed for wheezing or shortness of breath. What changed: You were already taking a medication with the same name, and this prescription was added. Make sure you understand how and when to take each.   atorvastatin 10 MG tablet Commonly known as: LIPITOR Take 1 tablet (10 mg total) by mouth every evening. What changed: when to take this   budesonide-formoterol 160-4.5 MCG/ACT inhaler Commonly known as: SYMBICORT Inhale 2 puffs into the lungs 2 (two) times daily. What changed: when to take this   doxycycline 100 MG tablet Commonly known as: VIBRA-TABS Take 1 tablet (100 mg total) by mouth 2 (two) times daily for 5 days.   furosemide 40 MG tablet Commonly known as: LASIX Take 1 tablet (40 mg total) by mouth 2 (two) times daily. What changed:   medication strength  how much to take  additional instructions  Another medication with the same name was removed. Continue taking this medication, and follow the directions you see here.   gabapentin 100 MG capsule Commonly known as: NEURONTIN Take 3 capsules (300 mg total) by mouth 2 (two) times daily. What changed:   medication strength  when to take this   glimepiride 2 MG tablet Commonly known as: Amaryl Take 1 tablet (2 mg total) by mouth daily with breakfast.   guaiFENesin 600 MG 12 hr tablet Commonly known as: MUCINEX Take 1 tablet (600 mg total) by mouth 2 (two) times daily.   HYDROcodone-acetaminophen 7.5-325 MG tablet Commonly known as: Norco Take 1 tablet by mouth every 6 (six) hours as needed for moderate pain (Must last 30 days).   Jardiance 25 MG Tabs tablet Generic drug: empagliflozin Take 25 mg by mouth daily.   metoprolol tartrate 25 MG tablet Commonly known as: LOPRESSOR Take 1 tablet (25 mg total) by mouth 2 (two) times daily.   nicotine 21 mg/24hr  patch Commonly known as: NICODERM CQ - dosed in mg/24 hours Place 1 patch (21 mg total) onto the skin daily. Start taking on: February 22, 2020   predniSONE 20 MG tablet Commonly known as: Deltasone Take 2 tablets (40 mg total) by mouth daily with breakfast.   traZODone 100 MG tablet Commonly known as: DESYREL Take 1 tablet (100 mg total) by mouth at bedtime.   warfarin 5 MG tablet Commonly known as: COUMADIN  Take 5 mg by mouth every morning. as directed       Major procedures and Radiology Reports - PLEASE review detailed and final reports for all details, in brief -   DG Chest Port 1 View  Result Date: 02/17/2020 CLINICAL DATA:  Shortness of breath EXAM: PORTABLE CHEST 1 VIEW COMPARISON:  February 14, 2020 FINDINGS: There is scarring in the right base region. There is mild atelectasis in the left base. There is stable cardiomegaly with pulmonary venous hypertension. No adenopathy. There is aortic atherosclerosis. No bone lesions. IMPRESSION: Cardiomegaly with pulmonary vascular congestion. Mild scarring right base. Mild atelectasis left base. No appreciable pulmonary edema. Aortic Atherosclerosis (ICD10-I70.0). Electronically Signed   By: Lowella Grip III M.D.   On: 02/17/2020 07:46   DG Chest Port 1 View  Result Date: 02/14/2020 CLINICAL DATA:  Shortness of breath and fluid retention. EXAM: PORTABLE CHEST 1 VIEW COMPARISON:  PA and lateral chest 02/08/2020 and 05/20/2019. FINDINGS: There is marked cardiomegaly and pulmonary vascular congestion. No consolidative process, pneumothorax or effusion. Atherosclerosis is noted. No acute or focal bony abnormality. IMPRESSION: Cardiomegaly and vascular congestion. Atherosclerosis. Electronically Signed   By: Inge Rise M.D.   On: 02/14/2020 17:45   ECHOCARDIOGRAM COMPLETE  Result Date: 02/16/2020    ECHOCARDIOGRAM REPORT   Patient Name:   Tracy [name] Date of Exam: 02/16/2020 Medical Rec #:  [id number]             Height:        66.0 in Accession #:    [id number]            Weight:       266.1 lb Date of Birth:  1961/06/16            BSA:          2.26 m Patient Age:    29 years              BP:           126/89 mmHg Patient Gender: F                     HR:           61 bpm. Exam Location:  [facility] Procedure: 2D Echo Indications:    Dyspnea 786.09 / R06.00  History:        Patient has prior history of Echocardiogram examinations, most                 recent 09/22/2017. CHF, Stroke and COPD; Risk Factors:Current                 Smoker, Diabetes and Hypertension. Crack cocaine use, RBBB,                 Acute renal failure , LVH , Acute respiratory failure with                 hypoxia.  Sonographer:    Leavy Cella RDCS (AE) Referring Phys: [id number] Tracy [name] IMPRESSIONS  1. Left ventricular ejection fraction, by estimation, is 55 to 60%. The left ventricle has normal function. The left ventricle has no regional wall motion abnormalities. There is mild concentric left ventricular hypertrophy. Left ventricular diastolic parameters are indeterminate. Elevated left ventricular end-diastolic pressure.  2. Right ventricular systolic function is severely reduced. The right ventricular size is moderately enlarged. mildly increased right ventricular wall thickness. There is moderately elevated pulmonary artery systolic pressure.  3.  Left atrial size was severely dilated.  4. Right atrial size was severely dilated.  5. The mitral valve is grossly normal. Mild mitral valve regurgitation.  6. Tricuspid valve regurgitation is moderate.  7. The aortic valve is tricuspid. Aortic valve regurgitation is not visualized. No aortic stenosis is present.  8. The inferior vena cava is dilated in size with >50% respiratory variability, suggesting right atrial pressure of 8 mmHg. FINDINGS  Left Ventricle: Left ventricular ejection fraction, by estimation, is 55 to 60%. The left ventricle has normal function. The left ventricle has no regional wall  motion abnormalities. The left ventricular internal cavity size was normal in size. There is  mild concentric left ventricular hypertrophy. Left ventricular diastolic parameters are indeterminate. Elevated left ventricular end-diastolic pressure. Right Ventricle: The right ventricular size is moderately enlarged. Mildly increased right ventricular wall thickness. Right ventricular systolic function is severely reduced. There is moderately elevated pulmonary artery systolic pressure. The tricuspid  regurgitant velocity is 3.12 m/s, and with an assumed right atrial pressure of 10 mmHg, the estimated right ventricular systolic pressure is 12.2 mmHg. Left Atrium: Left atrial size was severely dilated. Right Atrium: Right atrial size was severely dilated. Pericardium: There is no evidence of pericardial effusion. Mitral Valve: The mitral valve is grossly normal. Mild mitral annular calcification. Mild mitral valve regurgitation. Tricuspid Valve: The tricuspid valve is grossly normal. Tricuspid valve regurgitation is moderate. Aortic Valve: The aortic valve is tricuspid. Aortic valve regurgitation is not visualized. No aortic stenosis is present. Pulmonic Valve: The pulmonic valve was grossly normal. Pulmonic valve regurgitation is not visualized. Aorta: The aortic root is normal in size and structure. Venous: The inferior vena cava is dilated in size with greater than 50% respiratory variability, suggesting right atrial pressure of 8 mmHg. IAS/Shunts: No atrial level shunt detected by color flow Doppler.  LEFT VENTRICLE PLAX 2D LVIDd:         4.54 cm  Diastology LVIDs:         2.77 cm  LV e' lateral:   11.20 cm/s LV PW:         1.40 cm  LV E/e' lateral: 7.8 LV IVS:        1.16 cm  LV e' medial:    4.24 cm/s LVOT diam:     2.10 cm  LV E/e' medial:  20.7 LV SV Index:   26.96 LVOT Area:     3.46 cm  RIGHT VENTRICLE RV S prime:     8.05 cm/s LEFT ATRIUM              Index       RIGHT ATRIUM           Index LA diam:         4.40 cm  1.95 cm/m  RA Area:     41.10 cm LA Vol (A2C):   143.0 ml 63.34 ml/m RA Volume:   176.00 ml 77.95 ml/m LA Vol (A4C):   99.1 ml  43.89 ml/m LA Biplane Vol: 121.0 ml 53.59 ml/m   AORTA Ao Root diam: 3.40 cm MITRAL VALVE               TRICUSPID VALVE MV Area (PHT): 4.06 cm    TR Peak grad:   38.9 mmHg MV Decel Time: 187 msec    TR Vmax:        312.00 cm/s MR Peak grad: 62.1 mmHg MR Vmax:      394.00 cm/s  SHUNTS MV E  velocity: 87.80 cm/s  Systemic Diam: 2.10 cm MV A velocity: 47.50 cm/s MV E/A ratio:  1.85 Kate Sable MD Electronically signed by Kate Sable MD Signature Date/Time: 02/16/2020/4:32:20 PM    Final    US Abdomen Limited RUQ  Result Date: 02/16/2020 CLINICAL DATA:  Bilirubinemia EXAM: ULTRASOUND ABDOMEN LIMITED RIGHT UPPER QUADRANT COMPARISON:  None. FINDINGS: Gallbladder: No gallstones or wall thickening visualized. No sonographic Murphy sign noted by sonographer. Common bile duct: Diameter: 3 mm Liver: No focal lesion identified. Within normal limits in parenchymal echogenicity. Portal vein is patent on color Doppler imaging with normal direction of blood flow towards the liver. IMPRESSION: Negative right upper quadrant ultrasound. Electronically Signed   By: Monte Fantasia M.D.   On: 02/16/2020 10:40    Micro Results    Recent Results (from the past 240 hour(s))  SARS CORONAVIRUS 2 (TAT 6-24 HRS) Nasopharyngeal Nasopharyngeal Swab     Status: None   Collection Time: 02/15/20 12:28 PM   Specimen: Nasopharyngeal Swab  Result Value Ref Range Status   SARS Coronavirus 2 NEGATIVE NEGATIVE Final    Comment: (NOTE) SARS-CoV-2 target nucleic acids are NOT DETECTED. The SARS-CoV-2 RNA is generally detectable in upper and lower respiratory specimens during the acute phase of infection. Negative results do not preclude SARS-CoV-2 infection, do not rule out co-infections with other pathogens, and should not be used as the sole basis for treatment or other patient  management decisions. Negative results must be combined with clinical observations, patient history, and epidemiological information. The expected result is Negative. Fact Sheet for Patients: SugarRoll.be Fact Sheet for Healthcare Providers: https://www.woods-mathews.com/ This test is not yet approved or cleared by the [place] FDA and  has been authorized for detection and/or diagnosis of SARS-CoV-2 by FDA under an Emergency Use Authorization (EUA). This EUA will remain  in effect (meaning this test can be used) for the duration of the COVID-19 declaration under Section 56 4(b)(1) of the Act, 21 U.S.C. section 360bbb-3(b)(1), unless the authorization is terminated or revoked sooner. Performed at [facility], [place] [address], [place], [place] [address]   MRSA PCR Screening     Status: None   Collection Time: 02/16/20  3:57 AM   Specimen: Nasal Mucosa; Nasopharyngeal  Result Value Ref Range Status   MRSA by PCR NEGATIVE NEGATIVE Final    Comment:        The GeneXpert MRSA Assay (FDA approved for NASAL specimens only), is one component of a comprehensive MRSA colonization surveillance program. It is not intended to diagnose MRSA infection nor to guide or monitor treatment for MRSA infections. Performed at [facility], [address], [place], [place] [address]        Today   Subjective    Tracy [name] today has no new complaints, -No dyspnea at rest, dyspnea on exertion improved          Patient has been seen and examined prior to discharge   Objective   Blood pressure 107/84, pulse 90, temperature (!) 97.5 F (36.4 C), temperature source Oral, resp. rate (!) 22, height '5\' 6"'$  (1.676 m), weight 122.6 kg, SpO2 95 %.   Intake/Output Summary (Last 24 hours) at 02/21/2020 1300 Last data filed at 02/20/2020 1730 Gross per 24 hour  Intake 480 ml  Output 300 ml  Net 180 ml    Exam Gen:- Awake Alert, no  acute distress , morbidly obese, able to speak in complete sentences HEENT:- [place].AT, No sclera icterus Nose- [place] 3L/min  Neck-Supple Neck,No JVD,.  Lungs-improved air movement, no wheezes  CV- S1, S2 normal, regular Abd-  +ve B.Sounds, Abd Soft, No tenderness, increased truncal adiposity Extremity/Skin:-Improved anasarca, improved lower extremity edema,   good pulses Psych-affect is appropriate, oriented x3 Neuro-no new focal deficits, no tremors    Data Review   CBC w Diff:  Lab Results  Component Value Date   WBC 4.5 02/20/2020   HGB 12.4 02/20/2020   HCT 43.5 02/20/2020   PLT 84 (L) 02/20/2020   LYMPHOPCT 23 02/14/2020   MONOPCT 6 02/14/2020   EOSPCT 0 02/14/2020   BASOPCT 1 02/14/2020    CMP:  Lab Results  Component Value Date   NA 139 02/21/2020   K 4.9 02/21/2020   CL 100 02/21/2020   CO2 28 02/21/2020   BUN 54 (H) 02/21/2020   CREATININE 2.25 (H) 02/21/2020   PROT 5.5 (L) 02/17/2020   ALBUMIN 2.9 (L) 02/19/2020   BILITOT 1.8 (H) 02/17/2020   ALKPHOS 182 (H) 02/17/2020   AST 23 02/17/2020   ALT 25 02/17/2020  .   Total Discharge time is about 33 minutes  Roxan Hockey M.D on 02/21/2020 at 1:00 PM  Go to [email or web address] -  for contact info  [facility] - Office  [phone]

## 2020-02-21 NOTE — Progress Notes (Signed)
Patient had 6 beat run of wide QRS per telemetry.

## 2020-02-23 DIAGNOSIS — E876 Hypokalemia: Secondary | ICD-10-CM | POA: Diagnosis not present

## 2020-02-23 DIAGNOSIS — I110 Hypertensive heart disease with heart failure: Secondary | ICD-10-CM | POA: Diagnosis not present

## 2020-02-23 DIAGNOSIS — M170 Bilateral primary osteoarthritis of knee: Secondary | ICD-10-CM | POA: Diagnosis not present

## 2020-02-23 DIAGNOSIS — D696 Thrombocytopenia, unspecified: Secondary | ICD-10-CM | POA: Diagnosis not present

## 2020-02-23 DIAGNOSIS — R000 Tachycardia, unspecified: Secondary | ICD-10-CM | POA: Diagnosis not present

## 2020-02-23 DIAGNOSIS — I5033 Acute on chronic diastolic (congestive) heart failure: Secondary | ICD-10-CM | POA: Diagnosis not present

## 2020-02-23 DIAGNOSIS — J441 Chronic obstructive pulmonary disease with (acute) exacerbation: Secondary | ICD-10-CM | POA: Diagnosis not present

## 2020-02-23 DIAGNOSIS — I0981 Rheumatic heart failure: Secondary | ICD-10-CM | POA: Diagnosis not present

## 2020-02-23 DIAGNOSIS — E119 Type 2 diabetes mellitus without complications: Secondary | ICD-10-CM | POA: Diagnosis not present

## 2020-02-23 DIAGNOSIS — I69351 Hemiplegia and hemiparesis following cerebral infarction affecting right dominant side: Secondary | ICD-10-CM | POA: Diagnosis not present

## 2020-02-23 DIAGNOSIS — I480 Paroxysmal atrial fibrillation: Secondary | ICD-10-CM | POA: Diagnosis not present

## 2020-02-23 DIAGNOSIS — I700 Atherosclerosis of aorta: Secondary | ICD-10-CM | POA: Diagnosis not present

## 2020-02-23 DIAGNOSIS — J9811 Atelectasis: Secondary | ICD-10-CM | POA: Diagnosis not present

## 2020-02-23 DIAGNOSIS — I081 Rheumatic disorders of both mitral and tricuspid valves: Secondary | ICD-10-CM | POA: Diagnosis not present

## 2020-02-23 DIAGNOSIS — I4510 Unspecified right bundle-branch block: Secondary | ICD-10-CM | POA: Diagnosis not present

## 2020-02-23 DIAGNOSIS — I252 Old myocardial infarction: Secondary | ICD-10-CM | POA: Diagnosis not present

## 2020-02-23 DIAGNOSIS — J9621 Acute and chronic respiratory failure with hypoxia: Secondary | ICD-10-CM | POA: Diagnosis not present

## 2020-02-23 DIAGNOSIS — N179 Acute kidney failure, unspecified: Secondary | ICD-10-CM | POA: Diagnosis not present

## 2020-02-24 DIAGNOSIS — N179 Acute kidney failure, unspecified: Secondary | ICD-10-CM | POA: Diagnosis not present

## 2020-02-24 DIAGNOSIS — I5033 Acute on chronic diastolic (congestive) heart failure: Secondary | ICD-10-CM | POA: Diagnosis not present

## 2020-02-24 DIAGNOSIS — I700 Atherosclerosis of aorta: Secondary | ICD-10-CM | POA: Diagnosis not present

## 2020-02-24 DIAGNOSIS — I69351 Hemiplegia and hemiparesis following cerebral infarction affecting right dominant side: Secondary | ICD-10-CM | POA: Diagnosis not present

## 2020-02-24 DIAGNOSIS — J9621 Acute and chronic respiratory failure with hypoxia: Secondary | ICD-10-CM | POA: Diagnosis not present

## 2020-02-24 DIAGNOSIS — I10 Essential (primary) hypertension: Secondary | ICD-10-CM | POA: Diagnosis not present

## 2020-02-24 DIAGNOSIS — R000 Tachycardia, unspecified: Secondary | ICD-10-CM | POA: Diagnosis not present

## 2020-02-24 DIAGNOSIS — Z86718 Personal history of other venous thrombosis and embolism: Secondary | ICD-10-CM | POA: Diagnosis not present

## 2020-02-24 DIAGNOSIS — D696 Thrombocytopenia, unspecified: Secondary | ICD-10-CM | POA: Diagnosis not present

## 2020-02-24 DIAGNOSIS — R531 Weakness: Secondary | ICD-10-CM | POA: Diagnosis not present

## 2020-02-24 DIAGNOSIS — I252 Old myocardial infarction: Secondary | ICD-10-CM | POA: Diagnosis not present

## 2020-02-24 DIAGNOSIS — I081 Rheumatic disorders of both mitral and tricuspid valves: Secondary | ICD-10-CM | POA: Diagnosis not present

## 2020-02-24 DIAGNOSIS — J9811 Atelectasis: Secondary | ICD-10-CM | POA: Diagnosis not present

## 2020-02-24 DIAGNOSIS — E119 Type 2 diabetes mellitus without complications: Secondary | ICD-10-CM | POA: Diagnosis not present

## 2020-02-24 DIAGNOSIS — I4510 Unspecified right bundle-branch block: Secondary | ICD-10-CM | POA: Diagnosis not present

## 2020-02-24 DIAGNOSIS — E876 Hypokalemia: Secondary | ICD-10-CM | POA: Diagnosis not present

## 2020-02-24 DIAGNOSIS — I0981 Rheumatic heart failure: Secondary | ICD-10-CM | POA: Diagnosis not present

## 2020-02-24 DIAGNOSIS — I480 Paroxysmal atrial fibrillation: Secondary | ICD-10-CM | POA: Diagnosis not present

## 2020-02-24 DIAGNOSIS — J441 Chronic obstructive pulmonary disease with (acute) exacerbation: Secondary | ICD-10-CM | POA: Diagnosis not present

## 2020-02-24 DIAGNOSIS — M170 Bilateral primary osteoarthritis of knee: Secondary | ICD-10-CM | POA: Diagnosis not present

## 2020-02-24 DIAGNOSIS — R4781 Slurred speech: Secondary | ICD-10-CM | POA: Diagnosis not present

## 2020-02-24 DIAGNOSIS — E1165 Type 2 diabetes mellitus with hyperglycemia: Secondary | ICD-10-CM | POA: Diagnosis not present

## 2020-02-24 DIAGNOSIS — I110 Hypertensive heart disease with heart failure: Secondary | ICD-10-CM | POA: Diagnosis not present

## 2020-02-25 DIAGNOSIS — E1165 Type 2 diabetes mellitus with hyperglycemia: Secondary | ICD-10-CM | POA: Diagnosis not present

## 2020-02-25 DIAGNOSIS — I10 Essential (primary) hypertension: Secondary | ICD-10-CM | POA: Diagnosis not present

## 2020-02-25 DIAGNOSIS — Z8673 Personal history of transient ischemic attack (TIA), and cerebral infarction without residual deficits: Secondary | ICD-10-CM | POA: Diagnosis not present

## 2020-02-25 DIAGNOSIS — R531 Weakness: Secondary | ICD-10-CM | POA: Diagnosis not present

## 2020-02-28 ENCOUNTER — Other Ambulatory Visit (HOSPITAL_COMMUNITY)
Admission: RE | Admit: 2020-02-28 | Discharge: 2020-02-28 | Disposition: A | Discharge status: Home / Self Care | Payer: Medicare Other | Source: Ambulatory Visit | Attending: Physician Assistant | Admitting: Physician Assistant

## 2020-02-28 ENCOUNTER — Telehealth: Payer: Self-pay

## 2020-02-28 ENCOUNTER — Encounter: Payer: Self-pay | Admitting: Physician Assistant

## 2020-02-28 ENCOUNTER — Other Ambulatory Visit: Payer: Self-pay

## 2020-02-28 ENCOUNTER — Ambulatory Visit (INDEPENDENT_AMBULATORY_CARE_PROVIDER_SITE_OTHER): Payer: Medicare Other | Admitting: Physician Assistant

## 2020-02-28 VITALS — BP 128/68 | HR 75 | Temp 96.5°F | Ht 66.0 in | Wt 257.4 lb

## 2020-02-28 DIAGNOSIS — J449 Chronic obstructive pulmonary disease, unspecified: Secondary | ICD-10-CM

## 2020-02-28 DIAGNOSIS — Z86718 Personal history of other venous thrombosis and embolism: Secondary | ICD-10-CM | POA: Diagnosis not present

## 2020-02-28 DIAGNOSIS — I480 Paroxysmal atrial fibrillation: Secondary | ICD-10-CM | POA: Insufficient documentation

## 2020-02-28 DIAGNOSIS — I5033 Acute on chronic diastolic (congestive) heart failure: Secondary | ICD-10-CM | POA: Diagnosis not present

## 2020-02-28 DIAGNOSIS — I10 Essential (primary) hypertension: Secondary | ICD-10-CM

## 2020-02-28 DIAGNOSIS — E785 Hyperlipidemia, unspecified: Secondary | ICD-10-CM | POA: Diagnosis not present

## 2020-02-28 LAB — BASIC METABOLIC PANEL
Anion gap: 14 (ref 5–15)
BUN: 74 mg/dL — ABNORMAL HIGH (ref 6–20)
CO2: 24 mmol/L (ref 22–32)
Calcium: 7.9 mg/dL — ABNORMAL LOW (ref 8.9–10.3)
Chloride: 103 mmol/L (ref 98–111)
Creatinine, Ser: 2.69 mg/dL — ABNORMAL HIGH (ref 0.44–1.00)
GFR calc Af Amer: 22 mL/min — ABNORMAL LOW (ref >60)
GFR calc non Af Amer: 19 mL/min — ABNORMAL LOW (ref >60)
Glucose, Bld: 163 mg/dL — ABNORMAL HIGH (ref 70–99)
Potassium: 3.4 mmol/L — ABNORMAL LOW (ref 3.5–5.1)
Sodium: 141 mmol/L (ref 135–145)

## 2020-02-28 LAB — BRAIN NATRIURETIC PEPTIDE: B Natriuretic Peptide: 661 pg/mL — ABNORMAL HIGH (ref 0.0–100.0)

## 2020-02-28 MED ORDER — POTASSIUM CHLORIDE CRYS ER 20 MEQ PO TBCR: EXTENDED_RELEASE_TABLET | ORAL | 0 refills | Status: DC

## 2020-02-28 NOTE — Telephone Encounter (Signed)
I spoke with daughter.Her mom will take potassium 20 meq daily for 4 days and then stop

## 2020-02-28 NOTE — Patient Instructions (Addendum)
Medication Instructions:  INCREASE Lasix to 80 mg twice a day for 4 days and then go back to regular dose of 40 mg twice a day   *If you need a refill on your cardiac medications before your next appointment, please call your pharmacy*   Lab Work: BNP,BMET today If you have labs (blood work) drawn today and your tests are completely normal, you will receive your results only by: [name] MyChart Message (if you have MyChart) OR . A paper copy in the mail If you have any lab test that is abnormal or we need to change your treatment, we will call you to review the results.   Testing/Procedures: none   Follow-Up: At [facility], you and your health needs are our priority.  As part of our continuing mission to provide you with exceptional heart care, we have created designated Provider Care Teams.  These Care Teams include your primary Cardiologist (physician) and Advanced Practice Providers (APPs -  Physician Assistants and Nurse Practitioners) who all work together to provide you with the care you need, when you need it.  We recommend signing up for the patient portal called "MyChart".  Sign up information is provided on this After Visit Summary.  MyChart is used to connect with patients for Virtual Visits (Telemedicine).  Patients are able to view lab/test results, encounter notes, upcoming appointments, etc.  Non-urgent messages can be sent to your provider as well.   To learn more about what you can do with MyChart, go to [email or web address].    Your next appointment:   2 week(s)  The format for your next appointment:   In Person  Provider:   Kate Sable, MD   Other Instructions STOP Smoking   ELEVATE legs when sitting   Two Gram Sodium Diet 2000 mg  What is Sodium? Sodium is a mineral found naturally in many foods. The most significant source of sodium in the diet is table salt, which is about 40% sodium.  Processed, convenience, and preserved foods also contain a  large amount of sodium.  The body needs only 500 mg of sodium daily to function,  A normal diet provides more than enough sodium even if you do not use salt.  Why Limit Sodium? A build up of sodium in the body can cause thirst, increased blood pressure, shortness of breath, and water retention.  Decreasing sodium in the diet can reduce edema and risk of heart attack or stroke associated with high blood pressure.  Keep in mind that there are many other factors involved in these health problems.  Heredity, obesity, lack of exercise, cigarette smoking, stress and what you eat all play a role.  General Guidelines:  Do not add salt at the table or in cooking.  One teaspoon of salt contains over 2 grams of sodium.  Read food labels  Avoid processed and convenience foods  Ask your dietitian before eating any foods not dicussed in the menu planning guidelines  Consult your physician if you wish to use a salt substitute or a sodium containing medication such as antacids.  Limit milk and milk products to 16 oz (2 cups) per day.  Shopping Hints:  READ LABELS!! "Dietetic" does not necessarily mean low sodium.  Salt and other sodium ingredients are often added to foods during processing.   Menu Planning Guidelines Food Group Choose More Often Avoid  Beverages (see also the milk group All fruit juices, low-sodium, salt-free vegetables juices, low-sodium carbonated beverages Regular vegetable or tomato juices,  commercially softened water used for drinking or cooking  Breads and Cereals Enriched white, wheat, rye and pumpernickel bread, hard rolls and dinner rolls; muffins, cornbread and waffles; most dry cereals, cooked cereal without added salt; unsalted crackers and breadsticks; low sodium or homemade bread crumbs Bread, rolls and crackers with salted tops; quick breads; instant hot cereals; pancakes; commercial bread stuffing; self-rising flower and biscuit mixes; regular bread crumbs or cracker crumbs   Desserts and Sweets Desserts and sweets mad with mild should be within allowance Instant pudding mixes and cake mixes  Fats Butter or margarine; vegetable oils; unsalted salad dressings, regular salad dressings limited to 1 Tbs; light, sour and heavy cream Regular salad dressings containing bacon fat, bacon bits, and salt pork; snack dips made with instant soup mixes or processed cheese; salted nuts  Fruits Most fresh, frozen and canned fruits Fruits processed with salt or sodium-containing ingredient (some dried fruits are processed with sodium sulfites        Vegetables Fresh, frozen vegetables and low- sodium canned vegetables Regular canned vegetables, sauerkraut, pickled vegetables, and others prepared in brine; frozen vegetables in sauces; vegetables seasoned with ham, bacon or salt pork  Condiments, Sauces, Miscellaneous  Salt substitute with physician's approval; pepper, herbs, spices; vinegar, lemon or lime juice; hot pepper sauce; garlic powder, onion powder, low sodium soy sauce (1 Tbs.); low sodium condiments (ketchup, chili sauce, mustard) in limited amounts (1 tsp.) fresh ground horseradish; unsalted tortilla chips, pretzels, potato chips, popcorn, salsa (1/4 cup) Any seasoning made with salt including garlic salt, celery salt, onion salt, and seasoned salt; sea salt, rock salt, kosher salt; meat tenderizers; monosodium glutamate; mustard, regular soy sauce, barbecue, sauce, chili sauce, teriyaki sauce, steak sauce, Worcestershire sauce, and most flavored vinegars; canned gravy and mixes; regular condiments; salted snack foods, olives, picles, relish, horseradish sauce, catsup   Food preparation: Try these seasonings Meats:    Pork Sage, onion Serve with applesauce  Chicken Poultry seasoning, thyme, parsley Serve with cranberry sauce  Lamb Curry powder, rosemary, garlic, thyme Serve with mint sauce or jelly  Veal Marjoram, basil Serve with current jelly, cranberry sauce  Beef  Pepper, bay leaf Serve with dry mustard, unsalted chive butter  Fish [place] leaf, dill Serve with unsalted lemon butter, unsalted parsley butter  Vegetables:    Asparagus Lemon juice   Broccoli Lemon juice   Carrots Mustard dressing parsley, mint, nutmeg, glazed with unsalted butter and sugar   Green beans Marjoram, lemon juice, nutmeg,dill seed   Tomatoes Basil, marjoram, onion   Spice /blend for [employer]" 4 tsp ground thyme 1 tsp ground sage 3 tsp ground rosemary 4 tsp ground marjoram   Test your knowledge 1. A product that says "Salt Free" may still contain sodium. True or False 2. Garlic Powder and Hot Pepper Sauce an be used as alternative seasonings.True or False 3. Processed foods have more sodium than fresh foods.  True or False 4. Canned Vegetables have less sodium than froze True or False  WAYS TO DECREASE YOUR SODIUM INTAKE 1. Avoid the use of added salt in cooking and at the table.  Table salt (and other prepared seasonings which contain salt) is probably one of the greatest sources of sodium in the diet.  Unsalted foods can gain flavor from the sweet, sour, and butter taste sensations of herbs and spices.  Instead of using salt for seasoning, try the following seasonings with the foods listed.  Remember: how you use them to enhance natural food flavors  is limited only by your creativity... Allspice-Meat, fish, eggs, fruit, peas, red and yellow vegetables Almond Extract-Fruit baked goods Anise Seed-Sweet breads, fruit, carrots, beets, cottage cheese, cookies (tastes like licorice) Basil-Meat, fish, eggs, vegetables, rice, vegetables salads, soups, sauces Bay Leaf-Meat, fish, stews, poultry Burnet-Salad, vegetables (cucumber-like flavor) Caraway Seed-Bread, cookies, cottage cheese, meat, vegetables, cheese, rice Cardamon-Baked goods, fruit, soups Celery Powder or seed-Salads, salad dressings, sauces, meatloaf, soup, bread.Do not use  celery salt Chervil-Meats, salads, fish,  eggs, vegetables, cottage cheese (parsley-like flavor) Chili Power-Meatloaf, chicken cheese, corn, eggplant, egg dishes Chives-Salads cottage cheese, egg dishes, soups, vegetables, sauces Cilantro-Salsa, casseroles Cinnamon-Baked goods, fruit, pork, lamb, chicken, carrots Cloves-Fruit, baked goods, fish, pot roast, green beans, beets, carrots Coriander-Pastry, cookies, meat, salads, cheese (lemon-orange flavor) Cumin-Meatloaf, fish,cheese, eggs, cabbage,fruit pie (caraway flavor) [employer], fruit, eggs, fish, poultry, cottage cheese, vegetables Dill Seed-Meat, cottage cheese, poultry, vegetables, fish, salads, bread Fennel Seed-Bread, cookies, apples, pork, eggs, fish, beets, cabbage, cheese, Licorice-like flavor Garlic-(buds or powder) Salads, meat, poultry, fish, bread, butter, vegetables, potatoes.Do not  use garlic salt Ginger-Fruit, vegetables, baked goods, meat, fish, poultry Horseradish Root-Meet, vegetables, butter Lemon Juice or Extract-Vegetables, fruit, tea, baked goods, fish salads Mace-Baked goods fruit, vegetables, fish, poultry (taste like nutmeg) Maple Extract-Syrups Marjoram-Meat, chicken, fish, vegetables, breads, green salads (taste like Sage) Mint-Tea, lamb, sherbet, vegetables, desserts, carrots, cabbage Mustard, Dry or Seed-Cheese, eggs, meats, vegetables, poultry Nutmeg-Baked goods, fruit, chicken, eggs, vegetables, desserts Onion Powder-Meat, fish, poultry, vegetables, cheese, eggs, bread, rice salads (Do not use   Onion salt) Orange Extract-Desserts, baked goods Oregano-Pasta, eggs, cheese, onions, pork, lamb, fish, chicken, vegetables, green salads Paprika-Meat, fish, poultry, eggs, cheese, vegetables Parsley Flakes-Butter, vegetables, meat fish, poultry, eggs, bread, salads (certain forms may   Contain sodium Pepper-Meat fish, poultry, vegetables, eggs Peppermint Extract-Desserts, baked goods Poppy Seed-Eggs, bread, cheese, fruit dressings, baked  goods, noodles, vegetables, cottage  [employer], poultry, meat, fish, cauliflower, turnips,eggs bread Saffron-Rice, bread, veal, chicken, fish, eggs Sage-Meat, fish, poultry, onions, eggplant, tomateos, pork, stews Savory-Eggs, salads, poultry, meat, rice, vegetables, soups, pork Tarragon-Meat, poultry, fish, eggs, butter, vegetables (licorice-like flavor)  Thyme-Meat, poultry, fish, eggs, vegetables, (clover-like flavor), sauces, soups Tumeric-Salads, butter, eggs, fish, rice, vegetables (saffron-like flavor) Vanilla Extract-Baked goods, candy Vinegar-Salads, vegetables, meat marinades Walnut Extract-baked goods, candy  2. Choose your Foods Wisely   The following is a list of foods to avoid which are high in sodium:  Meats-Avoid all smoked, canned, salt cured, dried and kosher meat and fish as well as Anchovies   [employer] meats:Bologna, Liverwurst, Pastrami Canned meat or fish  Marinated herring Caviar    Pepperoni Corned Beef   Pizza Dried chipped beef  Salami Frozen breaded fish or meat Salt pork Frankfurters or hot dogs  Sardines Gefilte fish   Sausage Ham (boiled ham, Proscuitto Smoked butt    spiced ham)   Spam      TV Dinners Vegetables Canned vegetables (Regular) Relish Canned mushrooms  Sauerkraut Olives    Tomato juice Pickles  Bakery and Dessert Products Canned puddings  Cream pies Cheesecake   Decorated cakes Cookies  Beverages/Juices Tomato juice, regular  Gatorade   V-8 vegetable juice, regular  Breads and Cereals Biscuit mixes   Salted potato chips, corn chips, pretzels Bread stuffing mixes  Salted crackers and rolls Pancake and waffle mixes Self-rising flour  Seasonings Accent    Meat sauces Barbecue sauce  Meat tenderizer Catsup    Monosodium glutamate (MSG) Celery salt  Onion salt Chili sauce   Prepared mustard Garlic salt   Salt, seasoned salt, sea salt Gravy mixes   Soy  sauce Horseradish   Steak sauce Ketchup   Tartar sauce Lite salt    Teriyaki sauce Marinade mixes   Worcestershire sauce  Others Baking powder   Cocoa and cocoa mixes Baking soda   Commercial casserole mixes Candy-caramels, chocolate  Dehydrated soups    Bars, fudge,nougats  Instant rice and pasta mixes Canned broth or soup  Maraschino cherries Cheese, aged and processed cheese and cheese spreads  Learning Assessment Quiz  Indicated T (for True) or F (for False) for each of the following statements:  1. _____ Fresh fruits and vegetables and unprocessed grains are generally low in sodium 2. _____ Water may contain a considerable amount of sodium, depending on the source 3. _____ You can always tell if a food is high in sodium by tasting it 4. _____ Certain laxatives my be high in sodium and should be avoided unless prescribed   by a physician or pharmacist 5. _____ Salt substitutes may be used freely by anyone on a sodium restricted diet 6. _____ Sodium is present in table salt, food additives and as a natural component of   most foods 7. _____ Table salt is approximately 90% sodium 8. _____ Limiting sodium intake may help prevent excess fluid accumulation in the body 9. _____ On a sodium-restricted diet, seasonings such as bouillon soy sauce, and    cooking wine should be used in place of table salt 10. _____ On an ingredient list, a product which lists monosodium glutamate as the first   ingredient is an appropriate food to include on a low sodium diet  Circle the best answer(s) to the following statements (Hint: there may be more than one correct answer)  11. On a low-sodium diet, some acceptable snack items are:    A. Olives  F. Bean dip   K. Grapefruit juice    B. Salted Pretzels G. Commercial Popcorn   L. Canned peaches    C. Carrot Sticks  H. Bouillon   M. Unsalted nuts   D. [place] fries  I. Peanut butter crackers N. Salami   E. Sweet pickles J. Tomato Juice   O.  Pizza  12.  Seasonings that may be used freely on a reduced - sodium diet include   A. Lemon wedges F.Monosodium glutamate K. Celery seed    B.Soysauce   G. Pepper   L. Mustard powder   C. Sea salt  H. Cooking wine  M. Onion flakes   D. Vinegar  E. Prepared horseradish N. Salsa   E. Sage   J. Worcestershire sauce  O. Chutney

## 2020-02-28 NOTE — Telephone Encounter (Signed)
-----   Message from Imogene Burn, PA-C sent at 02/28/2020  3:26 PM EST ----- Kidney function worsening and CHF marker elevated. Continue with lasix as instructed. Potassium low 3.4. Take Kdur 20 meq daily while on extra lasix for 4 days then stop. Needs to get in with renal asap-daughter has contacted them already but doesn't have appt. Will need Repeat bnp and bmet at f/u

## 2020-02-28 NOTE — Progress Notes (Signed)
Cardiology Office Note    Date:  02/28/2020   ID:  Tracy, [name] 13-Jan-1961, MRN [id number]  PCP:  Tracy Percy, MD  Cardiologist: Tracy Sable, MD EPS: None  No chief complaint on file.   History of Present Illness:  Tracy [name] is a 59 y.o. 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    Patient was discharged from the hospital 02/21/2020 after an admission with acute on chronic diastolic CHF and noncompliance.  She had more than 20 pound weight gain and anasarca.  She also had episodes of tachyarrhythmias and PAF rate controlled on metoprolol and already on Coumadin Patient left AMA 02/08/2020, 02/13/2019. 2D echo 02/16/2020 LVEF 55 to 60% with mild LVH, right ventricular systolic function severely reduced LA and RA severely dilated.  Patient comes in for f/u accompanied by her daughter. Discharge weight 270 lb now down to 257, very sleepy. Legs swollen today. Still using salt and smoking. Drank soda's yesterday.Food brought in by family.   Past Medical History:  Diagnosis Date  . Acute ischemic stroke ([place]) 07/03/2012  . Crack cocaine use   . Diabetes ([name])   . Diabetes mellitus ([name])   . DVT (deep venous thrombosis) ([name])   . Hemiparesis, acute ([name]) 07/04/2012  . Hypertension   . LVH (left ventricular hypertrophy) 07/04/2012   Ejection fraction 60%.  . Myocardial infarct, old   . Thrombocytopenia ([place]) 07/03/2012  . Tobacco abuse     Past Surgical History:  Procedure Laterality Date  . ABDOMINAL HYSTERECTOMY    . HERNIA REPAIR      Current Medications: Current Meds  Medication Sig  . acetaminophen (TYLENOL) 325 MG tablet Take 2 tablets (650 mg total) by mouth every 6 (six) hours as needed for mild pain (or Fever >/= 101).  [name]  albuterol (PROVENTIL) (2.5 MG/3ML) 0.083% nebulizer solution Take 3 mLs (2.5 mg total) by nebulization every 4 (four) hours as needed for wheezing or shortness of breath.  [name] albuterol (VENTOLIN HFA) 108 (90 Base) MCG/ACT inhaler Inhale 2 puffs into the lungs every 4 (four) hours as needed for wheezing or shortness of breath.  [name] atorvastatin (LIPITOR) 10 MG tablet Take 1 tablet (10 mg total) by mouth every evening.  . budesonide-formoterol (SYMBICORT) 160-4.5 MCG/ACT inhaler Inhale 2 puffs into the lungs 2 (two) times daily.  . furosemide (LASIX) 40 MG tablet Take 1 tablet (40 mg total) by mouth 2 (two) times daily.  [name] gabapentin (NEURONTIN) 100 MG capsule Take 3 capsules (300 mg total) by mouth 2 (two) times daily.  [name] glimepiride (AMARYL) 2 MG tablet Take 1 tablet (2 mg total) by mouth daily with breakfast.  . guaiFENesin (MUCINEX) 600 MG 12 hr tablet Take 1 tablet (600 mg total) by mouth 2 (two) times daily.  [name] HYDROcodone-acetaminophen (NORCO) 7.5-325 MG tablet Take 1 tablet by mouth every 6 (six) hours as needed for moderate pain (Must last 30 days).  [name] JARDIANCE 25 MG TABS tablet Take 25 mg by mouth daily.  . metoprolol tartrate (LOPRESSOR) 25 MG tablet Take 1 tablet (25 mg total) by mouth 2 (two) times daily.  . nicotine (NICODERM CQ - DOSED IN MG/24 HOURS) 21 mg/24hr patch Place 1 patch (21 mg total) onto the skin daily.  . predniSONE (DELTASONE) 20 MG tablet  Take 2 tablets (40 mg total) by mouth daily with breakfast.  . traZODone (DESYREL) 100 MG tablet Take 1 tablet (100 mg total) by mouth at bedtime.  [name] warfarin (COUMADIN) 5 MG tablet Take 5 mg by mouth every morning. as directed     Allergies:   Patient has no known allergies.   Social History   Socioeconomic History  . Marital status: Widowed    Spouse name: Not on file  . Number of children: Not on file  . Years of education: Not on file  . Highest education level: Not on file  Occupational History  . Not on file  Tobacco Use   . Smoking status: Current Every Day Smoker    Packs/day: 1.00    Types: Cigarettes  . Smokeless tobacco: Never Used  Substance and Sexual Activity  . Alcohol use: No  . Drug use: No    Comment: last used 2 years ago  . Sexual activity: Never    Birth control/protection: Surgical  Other Topics Concern  . Not on file  Social History Narrative  . Not on file   Social Determinants of Health   Financial Resource Strain:   . Difficulty of Paying Living Expenses: Not on file  Food Insecurity:   . Worried About Charity fundraiser in the Last Year: Not on file  . Ran Out of Food in the Last Year: Not on file  Transportation Needs:   . Lack of Transportation (Medical): Not on file  . Lack of Transportation (Non-Medical): Not on file  Physical Activity:   . Days of Exercise per Week: Not on file  . Minutes of Exercise per Session: Not on file  Stress:   . Feeling of Stress : Not on file  Social Connections:   . Frequency of Communication with Friends and Family: Not on file  . Frequency of Social Gatherings with Friends and Family: Not on file  . Attends Religious Services: Not on file  . Active Member of Clubs or Organizations: Not on file  . Attends Archivist Meetings: Not on file  . Marital Status: Not on file     Family History:  The patient's family history includes Diabetes in her brother and mother; Heart disease in her father and mother; Hypertension in her father and mother; Stroke in her mother.   ROS:   Please see the history of present illness.    ROS All other systems reviewed and are negative.   PHYSICAL EXAM:   VS:  BP 128/68   Pulse 75   Temp (!) 96.5 F (35.8 C)   Ht 5\' 6"  (1.676 m)   Wt 257 lb 6.4 oz (116.8 kg)   SpO2 97%   BMI 41.55 kg/m   Physical Exam  GEN: Well nourished, well developed, in no acute distress  Neck: increased JVD,no carotid bruits, or masses Cardiac:RRR; no murmurs, rubs, or gallops  Respiratory:  clear to  auscultation bilaterally, normal work of breathing GI: soft, nontender, nondistended, + BS Ext: plus 2-3 edema all the way up into her thighs Neuro:  Alert and Oriented x 3 Psych:falling asleep in wheelchair on O2  Wt Readings from Last 3 Encounters:  02/28/20 257 lb 6.4 oz (116.8 kg)  02/21/20 270 lb 4.5 oz (122.6 kg)  02/14/20 260 lb (117.9 kg)      Studies/Labs Reviewed:   EKG:  EKG is not ordered today.    Recent Labs: 02/15/2020: B Natriuretic Peptide 454.0; Magnesium 1.4; TSH  1.960 02/17/2020: ALT 25 02/20/2020: Hemoglobin 12.4; Platelets 84 02/21/2020: BUN 54; Creatinine, Ser 2.25; Potassium 4.9; Sodium 139   Lipid Panel    Component Value Date/Time   CHOL 150 07/04/2012 0523   TRIG 65 07/04/2012 0523   HDL 62 07/04/2012 0523   CHOLHDL 2.4 07/04/2012 0523   VLDL 13 07/04/2012 0523   LDLCALC 75 07/04/2012 0523    Additional studies/ records that were reviewed today include:  2D echo 2/17/2021IMPRESSIONS     1. Left ventricular ejection fraction, by estimation, is 55 to 60%. The  left ventricle has normal function. The left ventricle has no regional  wall motion abnormalities. There is mild concentric left ventricular  hypertrophy. Left ventricular diastolic  parameters are indeterminate. Elevated left ventricular end-diastolic  pressure.   2. Right ventricular systolic function is severely reduced. The right  ventricular size is moderately enlarged. mildly increased right  ventricular wall thickness. There is moderately elevated pulmonary artery  systolic pressure.   3. Left atrial size was severely dilated.   4. Right atrial size was severely dilated.   5. The mitral valve is grossly normal. Mild mitral valve regurgitation.   6. Tricuspid valve regurgitation is moderate.   7. The aortic valve is tricuspid. Aortic valve regurgitation is not  visualized. No aortic stenosis is present.   8. The inferior vena cava is dilated in size with >50% respiratory    variability, suggesting right atrial pressure of 8 mmHg.      ASSESSMENT:    1. Acute on chronic diastolic CHF (congestive heart failure) (HCC)   2. Paroxysmal atrial fibrillation ([place])   3. Essential hypertension   4. Hyperlipidemia, unspecified hyperlipidemia type   5. History of DVT (deep vein thrombosis)   6. Chronic obstructive pulmonary disease, unspecified COPD type ([place])      PLAN:  In order of problems listed above:  Acute on chronic diastolic CHF with long history of noncompliance.  Patient's weight is down about 13 pounds since hospitalization but leg edema has worsened over the past 24 hours.  Patient does use a saltshaker and gets food from the outside.  She has significant edema on exam today despite weight loss.  Will check BMET and BNP.  Increase Lasix to 80 mg twice daily for 4 days then back down to 40 mg twice daily.  2 g sodium diet discussed again, keep legs elevated.  PAF/tachyarrhythmias on metoprolol and Coumadin  Essential hypertension blood pressure controlled  Hyperlipidemia on atorvastatin  History of DVT on lifelong Coumadin  COPD on home O2 still smoking and refusing nicotine patches  Type II DM managed by PCP    Medication Adjustments/Labs and Tests Ordered: Current medicines are reviewed at length with the patient today.  Concerns regarding medicines are outlined above.  Medication changes, Labs and Tests ordered today are listed in the Patient Instructions below. Patient Instructions  Medication Instructions:  INCREASE Lasix to 80 mg twice a day for 4 days and then go back to regular dose of 40 mg twice a day   *If you need a refill on your cardiac medications before your next appointment, please call your pharmacy*   Lab Work: BNP,BMET today If you have labs (blood work) drawn today and your tests are completely normal, you will receive your results only by: [name] MyChart Message (if you have MyChart) OR . A paper copy in the mail If  you have any lab test that is abnormal or we need to change your treatment,  we will call you to review the results.   Testing/Procedures: none   Follow-Up: At [facility], you and your health needs are our priority.  As part of our continuing mission to provide you with exceptional heart care, we have created designated Provider Care Teams.  These Care Teams include your primary Cardiologist (physician) and Advanced Practice Providers (APPs -  Physician Assistants and Nurse Practitioners) who all work together to provide you with the care you need, when you need it.  We recommend signing up for the patient portal called "MyChart".  Sign up information is provided on this After Visit Summary.  MyChart is used to connect with patients for Virtual Visits (Telemedicine).  Patients are able to view lab/test results, encounter notes, upcoming appointments, etc.  Non-urgent messages can be sent to your provider as well.   To learn more about what you can do with MyChart, go to [email or web address].    Your next appointment:   2 week(s)  The format for your next appointment:   In Person  Provider:   Kate Sable, MD   Other Instructions STOP Smoking   ELEVATE legs when sitting        Signed, Ermalinda Barrios, PA-C  02/28/2020 1:59 PM    [address], [place], [place]  [address] Phone: [phone]; Fax: [phone]

## 2020-02-29 ENCOUNTER — Telehealth: Payer: Self-pay

## 2020-02-29 DIAGNOSIS — I509 Heart failure, unspecified: Secondary | ICD-10-CM | POA: Diagnosis not present

## 2020-02-29 DIAGNOSIS — I4510 Unspecified right bundle-branch block: Secondary | ICD-10-CM | POA: Diagnosis not present

## 2020-02-29 DIAGNOSIS — I0981 Rheumatic heart failure: Secondary | ICD-10-CM | POA: Diagnosis not present

## 2020-02-29 DIAGNOSIS — I10 Essential (primary) hypertension: Secondary | ICD-10-CM | POA: Diagnosis not present

## 2020-02-29 DIAGNOSIS — I480 Paroxysmal atrial fibrillation: Secondary | ICD-10-CM | POA: Diagnosis not present

## 2020-02-29 DIAGNOSIS — I110 Hypertensive heart disease with heart failure: Secondary | ICD-10-CM | POA: Diagnosis not present

## 2020-02-29 DIAGNOSIS — E876 Hypokalemia: Secondary | ICD-10-CM | POA: Diagnosis not present

## 2020-02-29 DIAGNOSIS — N179 Acute kidney failure, unspecified: Secondary | ICD-10-CM | POA: Diagnosis not present

## 2020-02-29 DIAGNOSIS — J9811 Atelectasis: Secondary | ICD-10-CM | POA: Diagnosis not present

## 2020-02-29 DIAGNOSIS — E119 Type 2 diabetes mellitus without complications: Secondary | ICD-10-CM | POA: Diagnosis not present

## 2020-02-29 DIAGNOSIS — I700 Atherosclerosis of aorta: Secondary | ICD-10-CM | POA: Diagnosis not present

## 2020-02-29 DIAGNOSIS — R000 Tachycardia, unspecified: Secondary | ICD-10-CM | POA: Diagnosis not present

## 2020-02-29 DIAGNOSIS — I081 Rheumatic disorders of both mitral and tricuspid valves: Secondary | ICD-10-CM | POA: Diagnosis not present

## 2020-02-29 DIAGNOSIS — I69351 Hemiplegia and hemiparesis following cerebral infarction affecting right dominant side: Secondary | ICD-10-CM | POA: Diagnosis not present

## 2020-02-29 DIAGNOSIS — J9621 Acute and chronic respiratory failure with hypoxia: Secondary | ICD-10-CM | POA: Diagnosis not present

## 2020-02-29 DIAGNOSIS — D696 Thrombocytopenia, unspecified: Secondary | ICD-10-CM | POA: Diagnosis not present

## 2020-02-29 DIAGNOSIS — Z86718 Personal history of other venous thrombosis and embolism: Secondary | ICD-10-CM | POA: Diagnosis not present

## 2020-02-29 DIAGNOSIS — M170 Bilateral primary osteoarthritis of knee: Secondary | ICD-10-CM | POA: Diagnosis not present

## 2020-02-29 DIAGNOSIS — E1165 Type 2 diabetes mellitus with hyperglycemia: Secondary | ICD-10-CM | POA: Diagnosis not present

## 2020-02-29 DIAGNOSIS — R531 Weakness: Secondary | ICD-10-CM | POA: Diagnosis not present

## 2020-02-29 DIAGNOSIS — J441 Chronic obstructive pulmonary disease with (acute) exacerbation: Secondary | ICD-10-CM | POA: Diagnosis not present

## 2020-02-29 DIAGNOSIS — I5033 Acute on chronic diastolic (congestive) heart failure: Secondary | ICD-10-CM | POA: Diagnosis not present

## 2020-02-29 DIAGNOSIS — I252 Old myocardial infarction: Secondary | ICD-10-CM | POA: Diagnosis not present

## 2020-02-29 NOTE — Telephone Encounter (Signed)
Per daughter Pt has gained 7lbs since visit on yesterday.   Please call Alyse Low [phone]  Thanks renee

## 2020-02-29 NOTE — Telephone Encounter (Signed)
Returned pt call. No answer, left message for pt to return call.  

## 2020-03-01 DIAGNOSIS — I4510 Unspecified right bundle-branch block: Secondary | ICD-10-CM | POA: Diagnosis not present

## 2020-03-01 DIAGNOSIS — J9621 Acute and chronic respiratory failure with hypoxia: Secondary | ICD-10-CM | POA: Diagnosis not present

## 2020-03-01 DIAGNOSIS — R000 Tachycardia, unspecified: Secondary | ICD-10-CM | POA: Diagnosis not present

## 2020-03-01 DIAGNOSIS — I5033 Acute on chronic diastolic (congestive) heart failure: Secondary | ICD-10-CM | POA: Diagnosis not present

## 2020-03-01 DIAGNOSIS — D696 Thrombocytopenia, unspecified: Secondary | ICD-10-CM | POA: Diagnosis not present

## 2020-03-01 DIAGNOSIS — I110 Hypertensive heart disease with heart failure: Secondary | ICD-10-CM | POA: Diagnosis not present

## 2020-03-01 DIAGNOSIS — I252 Old myocardial infarction: Secondary | ICD-10-CM | POA: Diagnosis not present

## 2020-03-01 DIAGNOSIS — N179 Acute kidney failure, unspecified: Secondary | ICD-10-CM | POA: Diagnosis not present

## 2020-03-01 DIAGNOSIS — I69351 Hemiplegia and hemiparesis following cerebral infarction affecting right dominant side: Secondary | ICD-10-CM | POA: Diagnosis not present

## 2020-03-01 DIAGNOSIS — I700 Atherosclerosis of aorta: Secondary | ICD-10-CM | POA: Diagnosis not present

## 2020-03-01 DIAGNOSIS — M170 Bilateral primary osteoarthritis of knee: Secondary | ICD-10-CM | POA: Diagnosis not present

## 2020-03-01 DIAGNOSIS — E119 Type 2 diabetes mellitus without complications: Secondary | ICD-10-CM | POA: Diagnosis not present

## 2020-03-01 DIAGNOSIS — J441 Chronic obstructive pulmonary disease with (acute) exacerbation: Secondary | ICD-10-CM | POA: Diagnosis not present

## 2020-03-01 DIAGNOSIS — I081 Rheumatic disorders of both mitral and tricuspid valves: Secondary | ICD-10-CM | POA: Diagnosis not present

## 2020-03-01 DIAGNOSIS — I480 Paroxysmal atrial fibrillation: Secondary | ICD-10-CM | POA: Diagnosis not present

## 2020-03-01 DIAGNOSIS — E876 Hypokalemia: Secondary | ICD-10-CM | POA: Diagnosis not present

## 2020-03-01 DIAGNOSIS — J9811 Atelectasis: Secondary | ICD-10-CM | POA: Diagnosis not present

## 2020-03-01 DIAGNOSIS — I0981 Rheumatic heart failure: Secondary | ICD-10-CM | POA: Diagnosis not present

## 2020-03-01 NOTE — Telephone Encounter (Signed)
Patient returned call to Lisa.

## 2020-03-01 NOTE — Telephone Encounter (Signed)
Spoke pt who states pt was seen by PCP and pt weighted on yesterday and she was 264 lb. Pt was 257lb in our office on Monday. Pt c/o increased lower extremity swelling. Denies SOB at this time. Pt does like to drink soda and eat fast food. Daughter states she only elevates legs when daughter is around. Please advise

## 2020-03-01 NOTE — Telephone Encounter (Signed)
She was recently seen by Gerrianne Scale for this, who increased her Lasix to 80 mg twice daily for 4 days.  Did she do this?  She has a longstanding history of noncompliance with both medications and dietary regimen.  She was hospitalized for decompensated heart failure in February for this very reason.  Her diet and medication noncompliance is likely causing her symptoms.  Please double check with patient and daughter if she followed previous medication instructions about increasing Lasix to 80 mg twice daily for 4 days.

## 2020-03-03 DIAGNOSIS — I110 Hypertensive heart disease with heart failure: Secondary | ICD-10-CM | POA: Diagnosis not present

## 2020-03-03 DIAGNOSIS — I700 Atherosclerosis of aorta: Secondary | ICD-10-CM | POA: Diagnosis not present

## 2020-03-03 DIAGNOSIS — J9811 Atelectasis: Secondary | ICD-10-CM | POA: Diagnosis not present

## 2020-03-03 DIAGNOSIS — I081 Rheumatic disorders of both mitral and tricuspid valves: Secondary | ICD-10-CM | POA: Diagnosis not present

## 2020-03-03 DIAGNOSIS — I4510 Unspecified right bundle-branch block: Secondary | ICD-10-CM | POA: Diagnosis not present

## 2020-03-03 DIAGNOSIS — Z7901 Long term (current) use of anticoagulants: Secondary | ICD-10-CM | POA: Diagnosis not present

## 2020-03-03 DIAGNOSIS — R000 Tachycardia, unspecified: Secondary | ICD-10-CM | POA: Diagnosis not present

## 2020-03-03 DIAGNOSIS — I5033 Acute on chronic diastolic (congestive) heart failure: Secondary | ICD-10-CM | POA: Diagnosis not present

## 2020-03-03 DIAGNOSIS — M170 Bilateral primary osteoarthritis of knee: Secondary | ICD-10-CM | POA: Diagnosis not present

## 2020-03-03 DIAGNOSIS — I69351 Hemiplegia and hemiparesis following cerebral infarction affecting right dominant side: Secondary | ICD-10-CM | POA: Diagnosis not present

## 2020-03-03 DIAGNOSIS — J9621 Acute and chronic respiratory failure with hypoxia: Secondary | ICD-10-CM | POA: Diagnosis not present

## 2020-03-03 DIAGNOSIS — I252 Old myocardial infarction: Secondary | ICD-10-CM | POA: Diagnosis not present

## 2020-03-03 DIAGNOSIS — N179 Acute kidney failure, unspecified: Secondary | ICD-10-CM | POA: Diagnosis not present

## 2020-03-03 DIAGNOSIS — D696 Thrombocytopenia, unspecified: Secondary | ICD-10-CM | POA: Diagnosis not present

## 2020-03-03 DIAGNOSIS — E876 Hypokalemia: Secondary | ICD-10-CM | POA: Diagnosis not present

## 2020-03-03 DIAGNOSIS — E119 Type 2 diabetes mellitus without complications: Secondary | ICD-10-CM | POA: Diagnosis not present

## 2020-03-03 DIAGNOSIS — I0981 Rheumatic heart failure: Secondary | ICD-10-CM | POA: Diagnosis not present

## 2020-03-03 DIAGNOSIS — J441 Chronic obstructive pulmonary disease with (acute) exacerbation: Secondary | ICD-10-CM | POA: Diagnosis not present

## 2020-03-03 DIAGNOSIS — I480 Paroxysmal atrial fibrillation: Secondary | ICD-10-CM | POA: Diagnosis not present

## 2020-03-03 NOTE — Telephone Encounter (Signed)
Spoke with daughter pt completed increased lasix dosage. Legs have decreased in size. Pt is feeling better. Daughter will update office on any changes.

## 2020-03-06 ENCOUNTER — Telehealth: Payer: Self-pay | Admitting: *Deleted

## 2020-03-06 ENCOUNTER — Telehealth: Payer: Self-pay | Admitting: Orthopaedic Surgery

## 2020-03-06 DIAGNOSIS — I10 Essential (primary) hypertension: Secondary | ICD-10-CM

## 2020-03-06 MED ORDER — HYDROCODONE-ACETAMINOPHEN 7.5-325 MG PO TABS: 1.0000 | ORAL_TABLET | Freq: Four times a day (QID) | ORAL | 0 refills | Status: DC | PRN

## 2020-03-06 NOTE — Telephone Encounter (Signed)
Patient requests refill on Hydrocodone/Acetaminophen 7.5-325  Mgs.  Qty  60  Sig: Take 1 tablet by mouth every 6 (six) hours as needed for moderate pain (Must last 30 days).  Patient states she uses [facility] in [place]

## 2020-03-06 NOTE — Telephone Encounter (Signed)
Pt c/o increased lower extremity swelling that started on today. Reports left leg is larger than right and has blisters. Pt denies SOB and CP at this time. Weight on yesterday was 255 lbs. Pt states she is unable to stand today and weigh. Please advise.

## 2020-03-06 NOTE — Telephone Encounter (Signed)
Daughter [name] notified and voiced understanding. Orders placed.

## 2020-03-06 NOTE — Telephone Encounter (Signed)
Increase lasix 80 mg bid for 4 days then back to 40 mg bid. Take extra dose of potassium with extra lasix. F/u bmet and bnp this week. Reiterate 2 gm sodium diet.

## 2020-03-07 ENCOUNTER — Other Ambulatory Visit: Payer: Self-pay

## 2020-03-07 ENCOUNTER — Encounter (HOSPITAL_COMMUNITY): Payer: Self-pay | Admitting: Emergency Medicine

## 2020-03-07 ENCOUNTER — Inpatient Hospital Stay (HOSPITAL_COMMUNITY)
Admission: EM | Admit: 2020-03-07 | Discharge: 2020-03-17 | DRG: 871 | Disposition: A | Discharge status: Skilled Nursing Facility | Payer: Medicare Other | Attending: Family Medicine | Admitting: Family Medicine

## 2020-03-07 ENCOUNTER — Inpatient Hospital Stay (HOSPITAL_COMMUNITY): Payer: Medicare Other

## 2020-03-07 ENCOUNTER — Emergency Department (HOSPITAL_COMMUNITY): Payer: Medicare Other

## 2020-03-07 DIAGNOSIS — R7989 Other specified abnormal findings of blood chemistry: Secondary | ICD-10-CM | POA: Diagnosis not present

## 2020-03-07 DIAGNOSIS — Z90710 Acquired absence of both cervix and uterus: Secondary | ICD-10-CM

## 2020-03-07 DIAGNOSIS — I5082 Biventricular heart failure: Secondary | ICD-10-CM | POA: Diagnosis present

## 2020-03-07 DIAGNOSIS — R410 Disorientation, unspecified: Secondary | ICD-10-CM | POA: Diagnosis not present

## 2020-03-07 DIAGNOSIS — G9340 Encephalopathy, unspecified: Secondary | ICD-10-CM | POA: Diagnosis not present

## 2020-03-07 DIAGNOSIS — E1129 Type 2 diabetes mellitus with other diabetic kidney complication: Secondary | ICD-10-CM | POA: Diagnosis not present

## 2020-03-07 DIAGNOSIS — Z20822 Contact with and (suspected) exposure to covid-19: Secondary | ICD-10-CM | POA: Diagnosis not present

## 2020-03-07 DIAGNOSIS — E872 Acidosis: Secondary | ICD-10-CM | POA: Diagnosis present

## 2020-03-07 DIAGNOSIS — I252 Old myocardial infarction: Secondary | ICD-10-CM

## 2020-03-07 DIAGNOSIS — Z8249 Family history of ischemic heart disease and other diseases of the circulatory system: Secondary | ICD-10-CM

## 2020-03-07 DIAGNOSIS — J9621 Acute and chronic respiratory failure with hypoxia: Secondary | ICD-10-CM | POA: Diagnosis not present

## 2020-03-07 DIAGNOSIS — I50813 Acute on chronic right heart failure: Secondary | ICD-10-CM | POA: Diagnosis not present

## 2020-03-07 DIAGNOSIS — I959 Hypotension, unspecified: Secondary | ICD-10-CM | POA: Diagnosis not present

## 2020-03-07 DIAGNOSIS — Z8673 Personal history of transient ischemic attack (TIA), and cerebral infarction without residual deficits: Secondary | ICD-10-CM

## 2020-03-07 DIAGNOSIS — J189 Pneumonia, unspecified organism: Secondary | ICD-10-CM | POA: Diagnosis present

## 2020-03-07 DIAGNOSIS — I10 Essential (primary) hypertension: Secondary | ICD-10-CM | POA: Diagnosis not present

## 2020-03-07 DIAGNOSIS — I5033 Acute on chronic diastolic (congestive) heart failure: Secondary | ICD-10-CM | POA: Diagnosis not present

## 2020-03-07 DIAGNOSIS — R0902 Hypoxemia: Secondary | ICD-10-CM | POA: Diagnosis not present

## 2020-03-07 DIAGNOSIS — I4819 Other persistent atrial fibrillation: Secondary | ICD-10-CM | POA: Diagnosis not present

## 2020-03-07 DIAGNOSIS — N3001 Acute cystitis with hematuria: Secondary | ICD-10-CM | POA: Diagnosis not present

## 2020-03-07 DIAGNOSIS — E1122 Type 2 diabetes mellitus with diabetic chronic kidney disease: Secondary | ICD-10-CM | POA: Diagnosis present

## 2020-03-07 DIAGNOSIS — I480 Paroxysmal atrial fibrillation: Secondary | ICD-10-CM | POA: Diagnosis present

## 2020-03-07 DIAGNOSIS — Z86718 Personal history of other venous thrombosis and embolism: Secondary | ICD-10-CM

## 2020-03-07 DIAGNOSIS — D696 Thrombocytopenia, unspecified: Secondary | ICD-10-CM | POA: Diagnosis present

## 2020-03-07 DIAGNOSIS — Z833 Family history of diabetes mellitus: Secondary | ICD-10-CM | POA: Diagnosis not present

## 2020-03-07 DIAGNOSIS — E871 Hypo-osmolality and hyponatremia: Secondary | ICD-10-CM | POA: Diagnosis not present

## 2020-03-07 DIAGNOSIS — G9341 Metabolic encephalopathy: Secondary | ICD-10-CM | POA: Diagnosis not present

## 2020-03-07 DIAGNOSIS — R404 Transient alteration of awareness: Secondary | ICD-10-CM | POA: Diagnosis not present

## 2020-03-07 DIAGNOSIS — N170 Acute kidney failure with tubular necrosis: Secondary | ICD-10-CM | POA: Diagnosis not present

## 2020-03-07 DIAGNOSIS — I130 Hypertensive heart and chronic kidney disease with heart failure and stage 1 through stage 4 chronic kidney disease, or unspecified chronic kidney disease: Secondary | ICD-10-CM | POA: Diagnosis not present

## 2020-03-07 DIAGNOSIS — Z7951 Long term (current) use of inhaled steroids: Secondary | ICD-10-CM | POA: Diagnosis not present

## 2020-03-07 DIAGNOSIS — Z9111 Patient's noncompliance with dietary regimen: Secondary | ICD-10-CM

## 2020-03-07 DIAGNOSIS — Y95 Nosocomial condition: Secondary | ICD-10-CM | POA: Diagnosis present

## 2020-03-07 DIAGNOSIS — Z79899 Other long term (current) drug therapy: Secondary | ICD-10-CM

## 2020-03-07 DIAGNOSIS — E11649 Type 2 diabetes mellitus with hypoglycemia without coma: Secondary | ICD-10-CM | POA: Diagnosis not present

## 2020-03-07 DIAGNOSIS — Z9114 Patient's other noncompliance with medication regimen: Secondary | ICD-10-CM

## 2020-03-07 DIAGNOSIS — R6520 Severe sepsis without septic shock: Secondary | ICD-10-CM | POA: Diagnosis not present

## 2020-03-07 DIAGNOSIS — I5023 Acute on chronic systolic (congestive) heart failure: Secondary | ICD-10-CM | POA: Diagnosis not present

## 2020-03-07 DIAGNOSIS — Z720 Tobacco use: Secondary | ICD-10-CM | POA: Diagnosis present

## 2020-03-07 DIAGNOSIS — E1165 Type 2 diabetes mellitus with hyperglycemia: Secondary | ICD-10-CM | POA: Diagnosis present

## 2020-03-07 DIAGNOSIS — E785 Hyperlipidemia, unspecified: Secondary | ICD-10-CM | POA: Diagnosis present

## 2020-03-07 DIAGNOSIS — J9611 Chronic respiratory failure with hypoxia: Secondary | ICD-10-CM | POA: Diagnosis present

## 2020-03-07 DIAGNOSIS — J440 Chronic obstructive pulmonary disease with acute lower respiratory infection: Secondary | ICD-10-CM | POA: Diagnosis not present

## 2020-03-07 DIAGNOSIS — J441 Chronic obstructive pulmonary disease with (acute) exacerbation: Secondary | ICD-10-CM | POA: Diagnosis present

## 2020-03-07 DIAGNOSIS — N179 Acute kidney failure, unspecified: Secondary | ICD-10-CM | POA: Diagnosis not present

## 2020-03-07 DIAGNOSIS — Z789 Other specified health status: Secondary | ICD-10-CM

## 2020-03-07 DIAGNOSIS — R791 Abnormal coagulation profile: Secondary | ICD-10-CM | POA: Diagnosis present

## 2020-03-07 DIAGNOSIS — E161 Other hypoglycemia: Secondary | ICD-10-CM | POA: Diagnosis not present

## 2020-03-07 DIAGNOSIS — Z9981 Dependence on supplemental oxygen: Secondary | ICD-10-CM

## 2020-03-07 DIAGNOSIS — I2781 Cor pulmonale (chronic): Secondary | ICD-10-CM | POA: Diagnosis present

## 2020-03-07 DIAGNOSIS — G8929 Other chronic pain: Secondary | ICD-10-CM | POA: Diagnosis present

## 2020-03-07 DIAGNOSIS — F17210 Nicotine dependence, cigarettes, uncomplicated: Secondary | ICD-10-CM | POA: Diagnosis present

## 2020-03-07 DIAGNOSIS — Z7984 Long term (current) use of oral hypoglycemic drugs: Secondary | ICD-10-CM | POA: Diagnosis not present

## 2020-03-07 DIAGNOSIS — D6959 Other secondary thrombocytopenia: Secondary | ICD-10-CM | POA: Diagnosis present

## 2020-03-07 DIAGNOSIS — A4151 Sepsis due to Escherichia coli [E. coli]: Secondary | ICD-10-CM | POA: Diagnosis present

## 2020-03-07 DIAGNOSIS — Z7401 Bed confinement status: Secondary | ICD-10-CM | POA: Diagnosis not present

## 2020-03-07 DIAGNOSIS — Z7901 Long term (current) use of anticoagulants: Secondary | ICD-10-CM

## 2020-03-07 DIAGNOSIS — R600 Localized edema: Secondary | ICD-10-CM | POA: Diagnosis not present

## 2020-03-07 DIAGNOSIS — R601 Generalized edema: Secondary | ICD-10-CM

## 2020-03-07 DIAGNOSIS — N184 Chronic kidney disease, stage 4 (severe): Secondary | ICD-10-CM | POA: Diagnosis present

## 2020-03-07 DIAGNOSIS — Z87440 Personal history of urinary (tract) infections: Secondary | ICD-10-CM | POA: Diagnosis not present

## 2020-03-07 DIAGNOSIS — R0602 Shortness of breath: Secondary | ICD-10-CM | POA: Diagnosis not present

## 2020-03-07 DIAGNOSIS — IMO0002 Reserved for concepts with insufficient information to code with codable children: Secondary | ICD-10-CM | POA: Diagnosis present

## 2020-03-07 DIAGNOSIS — I132 Hypertensive heart and chronic kidney disease with heart failure and with stage 5 chronic kidney disease, or end stage renal disease: Secondary | ICD-10-CM | POA: Diagnosis not present

## 2020-03-07 DIAGNOSIS — Z743 Need for continuous supervision: Secondary | ICD-10-CM | POA: Diagnosis not present

## 2020-03-07 DIAGNOSIS — E162 Hypoglycemia, unspecified: Secondary | ICD-10-CM | POA: Diagnosis not present

## 2020-03-07 DIAGNOSIS — E875 Hyperkalemia: Secondary | ICD-10-CM | POA: Diagnosis not present

## 2020-03-07 DIAGNOSIS — I2729 Other secondary pulmonary hypertension: Secondary | ICD-10-CM | POA: Diagnosis present

## 2020-03-07 DIAGNOSIS — E876 Hypokalemia: Secondary | ICD-10-CM | POA: Diagnosis not present

## 2020-03-07 DIAGNOSIS — I4891 Unspecified atrial fibrillation: Secondary | ICD-10-CM | POA: Diagnosis not present

## 2020-03-07 DIAGNOSIS — R0600 Dyspnea, unspecified: Secondary | ICD-10-CM

## 2020-03-07 DIAGNOSIS — N390 Urinary tract infection, site not specified: Secondary | ICD-10-CM | POA: Diagnosis present

## 2020-03-07 DIAGNOSIS — K761 Chronic passive congestion of liver: Secondary | ICD-10-CM | POA: Diagnosis present

## 2020-03-07 DIAGNOSIS — Z6841 Body Mass Index (BMI) 40.0 and over, adult: Secondary | ICD-10-CM | POA: Diagnosis not present

## 2020-03-07 DIAGNOSIS — J9610 Chronic respiratory failure, unspecified whether with hypoxia or hypercapnia: Secondary | ICD-10-CM | POA: Diagnosis not present

## 2020-03-07 LAB — CBC WITH DIFFERENTIAL/PLATELET
Abs Immature Granulocytes: 0.15 10*3/uL — ABNORMAL HIGH (ref 0.00–0.07)
Basophils Absolute: 0.1 10*3/uL (ref 0.0–0.1)
Basophils Relative: 0 %
Eosinophils Absolute: 0 10*3/uL (ref 0.0–0.5)
Eosinophils Relative: 0 %
HCT: 43 % (ref 36.0–46.0)
Hemoglobin: 12.6 g/dL (ref 12.0–15.0)
Immature Granulocytes: 1 %
Lymphocytes Relative: 6 %
Lymphs Abs: 1.1 10*3/uL (ref 0.7–4.0)
MCH: 27.8 pg (ref 26.0–34.0)
MCHC: 29.3 g/dL — ABNORMAL LOW (ref 30.0–36.0)
MCV: 94.9 fL (ref 80.0–100.0)
Monocytes Absolute: 0.5 10*3/uL (ref 0.1–1.0)
Monocytes Relative: 3 %
Neutro Abs: 16.1 10*3/uL — ABNORMAL HIGH (ref 1.7–7.7)
Neutrophils Relative %: 90 %
Platelets: 82 10*3/uL — ABNORMAL LOW (ref 150–400)
RBC: 4.53 MIL/uL (ref 3.87–5.11)
RDW: 24.6 % — ABNORMAL HIGH (ref 11.5–15.5)
WBC: 17.8 10*3/uL — ABNORMAL HIGH (ref 4.0–10.5)
nRBC: 0.7 % — ABNORMAL HIGH (ref 0.0–0.2)

## 2020-03-07 LAB — COMPREHENSIVE METABOLIC PANEL
ALT: 123 U/L — ABNORMAL HIGH (ref 0–44)
AST: 66 U/L — ABNORMAL HIGH (ref 15–41)
Albumin: 2.9 g/dL — ABNORMAL LOW (ref 3.5–5.0)
Alkaline Phosphatase: 315 U/L — ABNORMAL HIGH (ref 38–126)
Anion gap: 14 (ref 5–15)
BUN: 67 mg/dL — ABNORMAL HIGH (ref 6–20)
CO2: 24 mmol/L (ref 22–32)
Calcium: 8.2 mg/dL — ABNORMAL LOW (ref 8.9–10.3)
Chloride: 100 mmol/L (ref 98–111)
Creatinine, Ser: 2.63 mg/dL — ABNORMAL HIGH (ref 0.44–1.00)
GFR calc Af Amer: 22 mL/min — ABNORMAL LOW (ref >60)
GFR calc non Af Amer: 19 mL/min — ABNORMAL LOW (ref >60)
Glucose, Bld: 98 mg/dL (ref 70–99)
Potassium: 3.5 mmol/L (ref 3.5–5.1)
Sodium: 138 mmol/L (ref 135–145)
Total Bilirubin: 7.4 mg/dL — ABNORMAL HIGH (ref 0.3–1.2)
Total Protein: 5.7 g/dL — ABNORMAL LOW (ref 6.5–8.1)

## 2020-03-07 LAB — URINALYSIS, ROUTINE W REFLEX MICROSCOPIC
Bilirubin Urine: NEGATIVE
Glucose, UA: 50 mg/dL — AB
Ketones, ur: NEGATIVE mg/dL
Nitrite: NEGATIVE
Protein, ur: 30 mg/dL — AB
Specific Gravity, Urine: 1.013 (ref 1.005–1.030)
WBC, UA: >50 WBC/hpf — ABNORMAL HIGH (ref 0–5)
pH: 5 (ref 5.0–8.0)

## 2020-03-07 LAB — BLOOD GAS, ARTERIAL
Acid-Base Excess: 0.3 mmol/L (ref 0.0–2.0)
Bicarbonate: 25 mmol/L (ref 20.0–28.0)
FIO2: 32
O2 Saturation: 93.7 %
Patient temperature: 36.2
pCO2 arterial: 33.9 mmHg (ref 32.0–48.0)
pH, Arterial: 7.459 — ABNORMAL HIGH (ref 7.350–7.450)
pO2, Arterial: 68.8 mmHg — ABNORMAL LOW (ref 83.0–108.0)

## 2020-03-07 LAB — RESPIRATORY PANEL BY RT PCR (FLU A&B, COVID)
Influenza A by PCR: NEGATIVE
Influenza B by PCR: NEGATIVE
SARS Coronavirus 2 by RT PCR: NEGATIVE

## 2020-03-07 LAB — GLUCOSE, CAPILLARY: Glucose-Capillary: 98 mg/dL (ref 70–99)

## 2020-03-07 LAB — LACTIC ACID, PLASMA: Lactic Acid, Venous: 2.7 mmol/L (ref 0.5–1.9)

## 2020-03-07 LAB — PROTIME-INR
INR: 3.5 — ABNORMAL HIGH (ref 0.8–1.2)
Prothrombin Time: 35 seconds — ABNORMAL HIGH (ref 11.4–15.2)

## 2020-03-07 LAB — CBG MONITORING, ED
Glucose-Capillary: 81 mg/dL (ref 70–99)
Glucose-Capillary: 68 mg/dL — ABNORMAL LOW (ref 70–99)
Glucose-Capillary: 72 mg/dL (ref 70–99)

## 2020-03-07 LAB — BRAIN NATRIURETIC PEPTIDE: B Natriuretic Peptide: 544 pg/mL — ABNORMAL HIGH (ref 0.0–100.0)

## 2020-03-07 MED ORDER — IPRATROPIUM-ALBUTEROL 0.5-2.5 (3) MG/3ML IN SOLN
3.0000 mL | Freq: Four times a day (QID) | RESPIRATORY_TRACT | Status: DC
  Filled 2020-03-07: qty 3

## 2020-03-07 MED ORDER — SODIUM CHLORIDE 0.9 % IV SOLN: 250.0000 mL | INTRAVENOUS | Status: DC | PRN

## 2020-03-07 MED ORDER — IPRATROPIUM-ALBUTEROL 0.5-2.5 (3) MG/3ML IN SOLN
3.0000 mL | Freq: Three times a day (TID) | RESPIRATORY_TRACT | Status: DC
  Administered 2020-03-08 – 2020-03-17 (×27): 3 mL via RESPIRATORY_TRACT
  Filled 2020-03-07 (×30): qty 3

## 2020-03-07 MED ORDER — SODIUM CHLORIDE 0.9% FLUSH
3.0000 mL | Freq: Two times a day (BID) | INTRAVENOUS | Status: DC
  Administered 2020-03-07 – 2020-03-17 (×18): 3 mL via INTRAVENOUS

## 2020-03-07 MED ORDER — NICOTINE 21 MG/24HR TD PT24
21.0000 mg | MEDICATED_PATCH | Freq: Every day | TRANSDERMAL | Status: DC
  Administered 2020-03-07 – 2020-03-17 (×11): 21 mg via TRANSDERMAL
  Filled 2020-03-07 (×12): qty 1

## 2020-03-07 MED ORDER — FUROSEMIDE 10 MG/ML IJ SOLN
60.0000 mg | Freq: Two times a day (BID) | INTRAMUSCULAR | Status: AC
  Administered 2020-03-07 – 2020-03-09 (×5): 60 mg via INTRAVENOUS
  Filled 2020-03-07 (×5): qty 6

## 2020-03-07 MED ORDER — ACETAMINOPHEN 325 MG PO TABS
650.0000 mg | ORAL_TABLET | ORAL | Status: DC | PRN
  Filled 2020-03-07: qty 2

## 2020-03-07 MED ORDER — SODIUM CHLORIDE 0.9 % IV SOLN
1.0000 g | Freq: Once | INTRAVENOUS | Status: AC
  Administered 2020-03-07: 1 g via INTRAVENOUS
  Filled 2020-03-07: qty 10

## 2020-03-07 MED ORDER — INSULIN ASPART 100 UNIT/ML ~~LOC~~ SOLN
0.0000 [IU] | Freq: Three times a day (TID) | SUBCUTANEOUS | Status: DC
  Administered 2020-03-08: 1 [IU] via SUBCUTANEOUS
  Administered 2020-03-08 – 2020-03-09 (×4): 2 [IU] via SUBCUTANEOUS
  Administered 2020-03-09 – 2020-03-10 (×3): 3 [IU] via SUBCUTANEOUS
  Administered 2020-03-10: 2 [IU] via SUBCUTANEOUS
  Administered 2020-03-11 – 2020-03-15 (×6): 1 [IU] via SUBCUTANEOUS
  Administered 2020-03-16: 2 [IU] via SUBCUTANEOUS
  Administered 2020-03-16: 1 [IU] via SUBCUTANEOUS

## 2020-03-07 MED ORDER — METHYLPREDNISOLONE SODIUM SUCC 40 MG IJ SOLR
40.0000 mg | Freq: Two times a day (BID) | INTRAMUSCULAR | Status: DC
  Administered 2020-03-07 – 2020-03-09 (×4): 40 mg via INTRAVENOUS
  Filled 2020-03-07 (×4): qty 1

## 2020-03-07 MED ORDER — ONDANSETRON HCL 4 MG/2ML IJ SOLN: 4.0000 mg | Freq: Four times a day (QID) | INTRAMUSCULAR | Status: DC | PRN

## 2020-03-07 MED ORDER — SODIUM CHLORIDE 0.9% FLUSH: 3.0000 mL | INTRAVENOUS | Status: DC | PRN

## 2020-03-07 MED ORDER — BUDESONIDE 0.25 MG/2ML IN SUSP
0.2500 mg | Freq: Two times a day (BID) | RESPIRATORY_TRACT | Status: DC
  Administered 2020-03-08 – 2020-03-17 (×19): 0.25 mg via RESPIRATORY_TRACT
  Filled 2020-03-07 (×20): qty 2

## 2020-03-07 MED ORDER — SODIUM CHLORIDE 0.9 % IV SOLN
1.0000 g | INTRAVENOUS | Status: DC
  Administered 2020-03-07 – 2020-03-12 (×5): 1 g via INTRAVENOUS
  Filled 2020-03-07 (×5): qty 10

## 2020-03-07 MED ORDER — ALBUTEROL SULFATE (2.5 MG/3ML) 0.083% IN NEBU: 2.5000 mg | INHALATION_SOLUTION | Freq: Four times a day (QID) | RESPIRATORY_TRACT | Status: DC | PRN

## 2020-03-07 MED ORDER — AZITHROMYCIN 250 MG PO TABS
250.0000 mg | ORAL_TABLET | Freq: Every day | ORAL | Status: AC
  Administered 2020-03-08 – 2020-03-11 (×4): 250 mg via ORAL
  Filled 2020-03-07 (×4): qty 1

## 2020-03-07 MED ORDER — AZITHROMYCIN 250 MG PO TABS
500.0000 mg | ORAL_TABLET | Freq: Every day | ORAL | Status: AC
  Administered 2020-03-07: 500 mg via ORAL
  Filled 2020-03-07: qty 2

## 2020-03-07 NOTE — ED Triage Notes (Signed)
Per EMS patient from home. Patient sent here by family due to hypoglycemia and AMS. CBG at home was 60. Patient was given PO glucose by EMS and CBG came up to 80 with improved mental status. Patient is now alert x 4. Family states decreased urinary output. History of kidney disease.

## 2020-03-07 NOTE — ED Provider Notes (Signed)
[facility] EMERGENCY DEPARTMENT Provider Note   CSN: [id number] Arrival date & time: 03/07/20  1057     History Chief Complaint  Patient presents with  . Hypoglycemia  . Recurrent UTI    Tracy [name] is a 59 y.o. female.  Patient complains of general weakness and swelling.  Patient has a history of heart failure.  Patient also having dysuria and frequency  The history is provided by the patient. No language interpreter was used.  Weakness Severity:  Moderate Onset quality:  Unable to specify Timing:  Constant Progression:  Worsening Chronicity:  Recurrent Context: not alcohol use   Relieved by:  Nothing Associated symptoms: no abdominal pain, no chest pain, no cough, no diarrhea, no frequency, no headaches and no seizures        Past Medical History:  Diagnosis Date  . Acute ischemic stroke ([place]) 07/03/2012  . Crack cocaine use   . Diabetes ([place])   . Diabetes mellitus ([place])   . DVT (deep venous thrombosis) ([place])   . Hemiparesis, acute ([place]) 07/04/2012  . Hypertension   . LVH (left ventricular hypertrophy) 07/04/2012   Ejection fraction 60%.  . Myocardial infarct, old   . Thrombocytopenia ([place]) 07/03/2012  . Tobacco abuse     Patient Active Problem List   Diagnosis Date Noted  . Acute on chronic diastolic CHF (congestive heart failure) ([place]) 03/07/2020  . Paroxysmal A-fib ([place]) 02/16/2020  . Acute exacerbation of CHF (congestive heart failure) ([place]) 02/15/2020  . Acute respiratory failure with hypoxia ([place]) 02/15/2020  . Acute on chronic heart failure with preserved ejection fraction (HFpEF) ([place]) 02/15/2020  . H/O: CVA (cerebrovascular accident)/ 2013 02/15/2020  . Diabetes mellitus type 2, uncontrolled ([place]) 02/15/2020  . Rt DVT (deep venous thrombosis) -2014 02/15/2020  . Anticoagulated on Coumadin 02/15/2020  . Right bundle branch block 09/05/2017  . Long term current use of anticoagulant therapy 03/06/2017  . Body mass index (BMI) of 40.0-44.9 in  adult ([place]) 02/20/2017  . Personal history of venous thrombosis and embolism 02/20/2017  . Umbilical hernia 14/43/1540  . Phlebitis and thrombophlebitis of lower extremities 01/13/2017  . Left knee pain 03/05/2016  . Influenza with respiratory manifestations 03/22/2015  . COPD exacerbation ([place]) 03/21/2015  . CAP (community acquired pneumonia) 07/04/2013  . Sepsis ([place]) 07/04/2013  . Acute renal failure ([place]) 07/04/2013  . Left thyroid nodule 07/04/2013  . Essential hypertension 07/04/2013  . Diabetes ([place]) 07/04/2013  . Acute respiratory failure ([place]) 07/04/2013  . Hemiparesis, acute ([place]) 07/04/2012  . LVH (left ventricular hypertrophy) 07/04/2012  . Acute ischemic stroke ([place]) 07/03/2012  . Hypertensive urgency 07/03/2012  . Tobacco abuse 07/03/2012  . Noncompliance 07/03/2012  . Thrombocytopenia ([place]) 07/03/2012  . Crack cocaine use 07/03/2012    Past Surgical History:  Procedure Laterality Date  . ABDOMINAL HYSTERECTOMY    . HERNIA REPAIR       OB History    Gravida  2   Para  2   Term  2   Preterm      AB      Living        SAB      TAB      Ectopic      Multiple      Live Births              Family History  Problem Relation Age of Onset  . Heart disease Mother   . Diabetes Mother   . Stroke Mother   .  Hypertension Mother   . Hypertension Father   . Heart disease Father   . Diabetes Brother     Social History   Tobacco Use  . Smoking status: Current Every Day Smoker    Packs/day: 1.00    Types: Cigarettes  . Smokeless tobacco: Never Used  Substance Use Topics  . Alcohol use: No  . Drug use: No    Comment: last used 2 years ago    Home Medications Prior to Admission medications   Medication Sig Start Date End Date Taking? Authorizing Provider  acetaminophen (TYLENOL) 325 MG tablet Take 2 tablets (650 mg total) by mouth every 6 (six) hours as needed for mild pain (or Fever >/= 101). 02/21/20  Yes Emokpae, Courage, MD  albuterol  (PROVENTIL) (2.5 MG/3ML) 0.083% nebulizer solution Take 3 mLs (2.5 mg total) by nebulization every 4 (four) hours as needed for wheezing or shortness of breath. 02/21/20  Yes Emokpae, Courage, MD  albuterol (VENTOLIN HFA) 108 (90 Base) MCG/ACT inhaler Inhale 2 puffs into the lungs every 4 (four) hours as needed for wheezing or shortness of breath. 02/21/20  Yes Emokpae, Courage, MD  atorvastatin (LIPITOR) 10 MG tablet Take 1 tablet (10 mg total) by mouth every evening. 02/21/20  Yes Emokpae, Courage, MD  budesonide-formoterol (SYMBICORT) 160-4.5 MCG/ACT inhaler Inhale 2 puffs into the lungs 2 (two) times daily. 02/21/20  Yes Emokpae, Courage, MD  furosemide (LASIX) 40 MG tablet Take 1 tablet (40 mg total) by mouth 2 (two) times daily. Patient taking differently: Take 80 mg by mouth 2 (two) times daily. Increased to 80 mg x 4 days due to fluid in legs 02/21/20  Yes Emokpae, Courage, MD  gabapentin (NEURONTIN) 100 MG capsule Take 3 capsules (300 mg total) by mouth 2 (two) times daily. 02/21/20  Yes Roxan Hockey, MD  glimepiride (AMARYL) 2 MG tablet Take 1 tablet (2 mg total) by mouth daily with breakfast. 02/21/20 02/20/21 Yes Emokpae, Courage, MD  HYDROcodone-acetaminophen (NORCO) 7.5-325 MG tablet Take 1 tablet by mouth every 6 (six) hours as needed for moderate pain (Must last 30 days). 03/06/20  Yes Sanjuana Kava, MD  ibuprofen (ADVIL) 200 MG tablet Take 200 mg by mouth every 6 (six) hours as needed (for foot pain).   Yes [provider]  JARDIANCE 25 MG TABS tablet Take 25 mg by mouth daily. 02/21/20  Yes Roxan Hockey, MD  metoprolol tartrate (LOPRESSOR) 25 MG tablet Take 1 tablet (25 mg total) by mouth 2 (two) times daily. 02/21/20  Yes Emokpae, Courage, MD  potassium chloride SA (KLOR-CON M20) 20 MEQ tablet Take 20 meq daily for 4 days and then STOP 02/28/20  Yes Imogene Burn, PA-C  warfarin (COUMADIN) 5 MG tablet Take 5 mg by mouth every morning. as directed 05/26/18  Yes [provider]  guaiFENesin (MUCINEX) 600 MG 12 hr tablet Take 1 tablet (600 mg total) by mouth 2 (two) times daily. Patient not taking: Reported on 03/07/2020 02/21/20   Roxan Hockey, MD  nicotine (NICODERM CQ - DOSED IN MG/24 HOURS) 21 mg/24hr patch Place 1 patch (21 mg total) onto the skin daily. Patient not taking: Reported on 03/07/2020 02/22/20   Roxan Hockey, MD  predniSONE (DELTASONE) 20 MG tablet Take 2 tablets (40 mg total) by mouth daily with breakfast. Patient not taking: Reported on 03/07/2020 02/21/20   Roxan Hockey, MD  traZODone (DESYREL) 100 MG tablet Take 1 tablet (100 mg total) by mouth at bedtime. Patient not taking: Reported on 03/07/2020 02/21/20  Emokpae, Courage, MD  XARELTO 20 MG TABS tablet Take 20 mg by mouth daily. 02/24/20   [provider]    Allergies    Patient has no known allergies.  Review of Systems   Review of Systems  Constitutional: Negative for appetite change and fatigue.  HENT: Negative for congestion, ear discharge and sinus pressure.   Eyes: Negative for discharge.  Respiratory: Negative for cough.   Cardiovascular: Negative for chest pain.  Gastrointestinal: Negative for abdominal pain and diarrhea.  Genitourinary: Negative for frequency and hematuria.  Musculoskeletal: Negative for back pain.       Swelling in legs  Skin: Negative for rash.  Neurological: Positive for weakness. Negative for seizures and headaches.  Psychiatric/Behavioral: Negative for hallucinations.    Physical Exam Updated Vital Signs BP 91/71   Pulse 91   Temp (!) 97.5 F (36.4 C) (Oral)   Resp 15   Ht 5\' 6"  (1.676 m)   Wt 120.7 kg   SpO2 94%   BMI 42.93 kg/m   Physical Exam Vitals and nursing note reviewed.  Constitutional:      Appearance: She is well-developed.  HENT:     Head: Normocephalic.     Nose: Nose normal.  Eyes:     General: No scleral icterus.    Conjunctiva/sclera: Conjunctivae normal.  Neck:     Thyroid: No thyromegaly.    Cardiovascular:     Rate and Rhythm: Normal rate and regular rhythm.     Heart sounds: No murmur. No friction rub. No gallop.   Pulmonary:     Breath sounds: No stridor. No wheezing or rales.  Chest:     Chest wall: No tenderness.  Abdominal:     General: There is no distension.     Tenderness: There is no abdominal tenderness. There is no rebound.     Comments: Edema in abdomen  Musculoskeletal:     Cervical back: Neck supple.     Comments: 3+ edema all the way up her legs into her abdomen  Lymphadenopathy:     Cervical: No cervical adenopathy.  Skin:    Findings: No erythema or rash.  Neurological:     Mental Status: She is alert and oriented to person, place, and time.     Motor: No abnormal muscle tone.     Coordination: Coordination normal.  Psychiatric:        Behavior: Behavior normal.     ED Results / Procedures / Treatments   Labs (all labs ordered are listed, but only abnormal results are displayed) Labs Reviewed  CBC WITH DIFFERENTIAL/PLATELET - Abnormal; Notable for the following components:      Result Value   WBC 17.8 (*)    MCHC 29.3 (*)    RDW 24.6 (*)    Platelets 82 (*)    nRBC 0.7 (*)    Neutro Abs 16.1 (*)    Abs Immature Granulocytes 0.15 (*)    All other components within normal limits  COMPREHENSIVE METABOLIC PANEL - Abnormal; Notable for the following components:   BUN 67 (*)    Creatinine, Ser 2.63 (*)    Calcium 8.2 (*)    Total Protein 5.7 (*)    Albumin 2.9 (*)    AST 66 (*)    ALT 123 (*)    Alkaline Phosphatase 315 (*)    Total Bilirubin 7.4 (*)    GFR calc non Af Amer 19 (*)    GFR calc Af Amer 22 (*)  All other components within normal limits  URINALYSIS, ROUTINE W REFLEX MICROSCOPIC - Abnormal; Notable for the following components:   Color, Urine AMBER (*)    APPearance CLOUDY (*)    Glucose, UA 50 (*)    Hgb urine dipstick LARGE (*)    Protein, ur 30 (*)    Leukocytes,Ua LARGE (*)    WBC, UA >50 (*)    Bacteria, UA  RARE (*)    All other components within normal limits  PROTIME-INR - Abnormal; Notable for the following components:   Prothrombin Time 35.0 (*)    INR 3.5 (*)    All other components within normal limits  LACTIC ACID, PLASMA - Abnormal; Notable for the following components:   Lactic Acid, Venous 2.7 (*)    All other components within normal limits  BRAIN NATRIURETIC PEPTIDE - Abnormal; Notable for the following components:   B Natriuretic Peptide 544.0 (*)    All other components within normal limits  CBG MONITORING, ED - Abnormal; Notable for the following components:   Glucose-Capillary 68 (*)    All other components within normal limits  RESPIRATORY PANEL BY RT PCR (FLU A&B, COVID)  URINE CULTURE  CBG MONITORING, ED    EKG None  Radiology DG Chest Port 1 View  Result Date: 03/07/2020 CLINICAL DATA:  Shortness of breath EXAM: PORTABLE CHEST 1 VIEW COMPARISON:  02/17/2020 FINDINGS: Stable cardiomegaly. Calcific aortic knob. Mild vascular congestion and subtly increased interstitial markings bilaterally. Linear atelectasis within the peripheral aspect of the left mid lung. The visualized skeletal structures are unremarkable. IMPRESSION: Cardiomegaly with mild pulmonary vascular congestion and subtly increased interstitial markings suggesting mild edema. Electronically Signed   By: Davina Poke D.O.   On: 03/07/2020 12:29   CRITICAL CARE Performed by: Milton Ferguson Total critical care time: 44minutes Critical care time was exclusive of separately billable procedures and treating other patients. Critical care was necessary to treat or prevent imminent or life-threatening deterioration. Critical care was time spent personally by me on the following activities: development of treatment plan with patient and/or surrogate as well as nursing, discussions with consultants, evaluation of patient's response to treatment, examination of patient, obtaining history from patient or surrogate,  ordering and performing treatments and interventions, ordering and review of laboratory studies, ordering and review of radiographic studies, pulse oximetry and re-evaluation of patient's condition.  Procedures Procedures (including critical care time)  Medications Ordered in ED Medications  cefTRIAXone (ROCEPHIN) 1 g in sodium chloride 0.9 % 100 mL IVPB (has no administration in time range)    ED Course  I have reviewed the triage vital signs and the nursing notes.  Pertinent labs & imaging results that were available during my care of the patient were reviewed by me and considered in my medical decision making (see chart for details).    MDM Rules/Calculators/A&P                      Patient with severe anasarca and a urinary tract infection.  She will be admitted to medicine Final Clinical Impression(s) / ED Diagnoses Final diagnoses:  Anasarca  Acute cystitis with hematuria    Rx / DC Orders ED Discharge Orders    None       Milton Ferguson, MD 03/07/20 [phone]

## 2020-03-07 NOTE — ED Notes (Signed)
US at bedside

## 2020-03-07 NOTE — H&P (Signed)
History and Physical    Tracy [name] WFU:[id number] DOB: 21-Feb-1961 DOA: 03/07/2020  PCP: Rory Percy, MD  Patient coming from: home  I have personally briefly reviewed patient's old medical records in [place]  Chief Complaint: Lower extremity edema and increased weight gain  HPI: Tracy [name] is a 59 y.o. female with medical history significant of Right-sided heart failure, COPD, tobacco use, chronic respiratory failure on home oxygen, diabetes and hypertension, was recently discharged on 2/22 after being treated for decompensated CHF.  Patient has been under the care of her daughter who reports that she has been compliant with her medications.  Patient has had worsening lower extremity edema and increased weight gain for over a week now.  She is not had any nausea, vomiting or abdominal pain.  Her p.o. intake has been poor since her last discharge.  She does become short of breath on exertion, but is not particularly worse than her baseline.  She was noted to be hypoglycemic and had altered mental status earlier today and was brought to the hospital for evaluation.  She has been afebrile.  Her daughter reports that her urine is quite dark.  She is noticed that she has been sleeping more lately and has been more somnolent.  ED Course: She was evaluated in the emergency room where she was noted to have significant anasarca.  Blood pressure noted to be on the lower side, high 90s to low 100s.  She was afebrile.  WBC count elevated at 17.8.  Liver enzymes were also elevated with elevated bilirubin.  Chest x-ray indicates evidence of interstitial edema.  Lactic acid mildly elevated at 2.7.  BNP elevated at 544.  INR elevated at 3.5.  Review of Systems: As per HPI otherwise 10 point review of systems negative.    Past Medical History:  Diagnosis Date  . Acute ischemic stroke ([place]) 07/03/2012  . Crack cocaine use   . Diabetes ([place])   . Diabetes mellitus ([place])   . DVT  (deep venous thrombosis) ([place])   . Hemiparesis, acute ([place]) 07/04/2012  . Hypertension   . LVH (left ventricular hypertrophy) 07/04/2012   Ejection fraction 60%.  . Myocardial infarct, old   . Thrombocytopenia ([place]) 07/03/2012  . Tobacco abuse     Past Surgical History:  Procedure Laterality Date  . ABDOMINAL HYSTERECTOMY    . HERNIA REPAIR      Social History:  reports that she has been smoking cigarettes. She has been smoking about 1.00 pack per day. She has never used smokeless tobacco. She reports that she does not drink alcohol or use drugs.  No Known Allergies  Family History  Problem Relation Age of Onset  . Heart disease Mother   . Diabetes Mother   . Stroke Mother   . Hypertension Mother   . Hypertension Father   . Heart disease Father   . Diabetes Brother      Prior to Admission medications   Medication Sig Start Date End Date Taking? Authorizing Provider  acetaminophen (TYLENOL) 325 MG tablet Take 2 tablets (650 mg total) by mouth every 6 (six) hours as needed for mild pain (or Fever >/= 101). 02/21/20  Yes Emokpae, Courage, MD  albuterol (PROVENTIL) (2.5 MG/3ML) 0.083% nebulizer solution Take 3 mLs (2.5 mg total) by nebulization every 4 (four) hours as needed for wheezing or shortness of breath. 02/21/20  Yes Emokpae, Courage, MD  albuterol (VENTOLIN HFA) 108 (90 Base) MCG/ACT inhaler Inhale 2 puffs into the  lungs every 4 (four) hours as needed for wheezing or shortness of breath. 02/21/20  Yes Emokpae, Courage, MD  atorvastatin (LIPITOR) 10 MG tablet Take 1 tablet (10 mg total) by mouth every evening. 02/21/20  Yes Emokpae, Courage, MD  budesonide-formoterol (SYMBICORT) 160-4.5 MCG/ACT inhaler Inhale 2 puffs into the lungs 2 (two) times daily. 02/21/20  Yes Emokpae, Courage, MD  furosemide (LASIX) 40 MG tablet Take 1 tablet (40 mg total) by mouth 2 (two) times daily. Patient taking differently: Take 80 mg by mouth 2 (two) times daily. Increased to 80 mg x 4 days due to  fluid in legs 02/21/20  Yes Emokpae, Courage, MD  gabapentin (NEURONTIN) 100 MG capsule Take 3 capsules (300 mg total) by mouth 2 (two) times daily. 02/21/20  Yes Roxan Hockey, MD  glimepiride (AMARYL) 2 MG tablet Take 1 tablet (2 mg total) by mouth daily with breakfast. 02/21/20 02/20/21 Yes Emokpae, Courage, MD  HYDROcodone-acetaminophen (NORCO) 7.5-325 MG tablet Take 1 tablet by mouth every 6 (six) hours as needed for moderate pain (Must last 30 days). 03/06/20  Yes Sanjuana Kava, MD  JARDIANCE 25 MG TABS tablet Take 25 mg by mouth daily. 02/21/20  Yes Roxan Hockey, MD  metoprolol tartrate (LOPRESSOR) 25 MG tablet Take 1 tablet (25 mg total) by mouth 2 (two) times daily. 02/21/20  Yes Emokpae, Courage, MD  potassium chloride SA (KLOR-CON M20) 20 MEQ tablet Take 20 meq daily for 4 days and then STOP 02/28/20  Yes Imogene Burn, PA-C  warfarin (COUMADIN) 5 MG tablet Take 5 mg by mouth every morning. as directed 05/26/18  Yes [provider]  guaiFENesin (MUCINEX) 600 MG 12 hr tablet Take 1 tablet (600 mg total) by mouth 2 (two) times daily. Patient not taking: Reported on 03/07/2020 02/21/20   Roxan Hockey, MD  nicotine (NICODERM CQ - DOSED IN MG/24 HOURS) 21 mg/24hr patch Place 1 patch (21 mg total) onto the skin daily. Patient not taking: Reported on 03/07/2020 02/22/20   Roxan Hockey, MD  predniSONE (DELTASONE) 20 MG tablet Take 2 tablets (40 mg total) by mouth daily with breakfast. Patient not taking: Reported on 03/07/2020 02/21/20   Roxan Hockey, MD  traZODone (DESYREL) 100 MG tablet Take 1 tablet (100 mg total) by mouth at bedtime. Patient not taking: Reported on 03/07/2020 02/21/20   Roxan Hockey, MD  XARELTO 20 MG TABS tablet Take 20 mg by mouth daily. 02/24/20   [provider]    Physical Exam: Vitals:   03/07/20 1400 03/07/20 1430 03/07/20 1535 03/07/20 1545  BP: 108/80 108/79  103/73  Pulse:      Resp: 19 (!) 22  20  Temp:    (!) 97.2 F (36.2 C)   TempSrc:    Oral  SpO2:    94%  Weight:   118.5 kg   Height:   5\' 6"  (1.676 m)     Constitutional: NAD, calm, comfortable Eyes: PERRL, lids and conjunctivae normal ENMT: Mucous membranes are dry. Posterior pharynx clear of any exudate or lesions.Normal dentition.  Neck: normal, supple, no masses, no thyromegaly Respiratory: Increased respiratory effort.  Bilateral wheezes diminished breath sounds.  Crackles at bases.. No accessory muscle use.  Cardiovascular: Irregular rate and rhythm, no murmurs / rubs / gallops.   No carotid bruits.  Abdomen: no tenderness, no masses palpated. No hepatosplenomegaly. Bowel sounds positive.  Musculoskeletal: no clubbing / cyanosis. No joint deformity upper and lower extremities. Good ROM, no contractures. Normal muscle tone.  2+ lower extremity  motor bilaterally with blistering noted Skin: Weeping blisters on lower legs bilaterally Neurologic: CN 2-12 grossly intact. Sensation intact, DTR normal. Strength 5/5 in all 4.  Psychiatric: Normal judgment and insight. Alert and oriented x 3. Normal mood.    Labs on Admission: I have personally reviewed following labs and imaging studies  CBC: Recent Labs  Lab 03/07/20 1107  WBC 17.8*  NEUTROABS 16.1*  HGB 12.6  HCT 43.0  MCV 94.9  PLT 82*   Basic Metabolic Panel: Recent Labs  Lab 03/07/20 1107  NA 138  K 3.5  CL 100  CO2 24  GLUCOSE 98  BUN 67*  CREATININE 2.63*  CALCIUM 8.2*   GFR: Estimated Creatinine Clearance: 30.2 mL/min (A) (by C-G formula based on SCr of 2.63 mg/dL (H)). Liver Function Tests: Recent Labs  Lab 03/07/20 1107  AST 66*  ALT 123*  ALKPHOS 315*  BILITOT 7.4*  PROT 5.7*  ALBUMIN 2.9*   No results for input(s): LIPASE, AMYLASE in the last 168 hours. No results for input(s): AMMONIA in the last 168 hours. Coagulation Profile: Recent Labs  Lab 03/07/20 1202  INR 3.5*   Cardiac Enzymes: No results for input(s): CKTOTAL, CKMB, CKMBINDEX, TROPONINI in the last  168 hours. BNP (last 3 results) No results for input(s): PROBNP in the last 8760 hours. HbA1C: No results for input(s): HGBA1C in the last 72 hours. CBG: Recent Labs  Lab 03/07/20 1111 03/07/20 1300 03/07/20 1426  GLUCAP 81 68* 72   Lipid Profile: No results for input(s): CHOL, HDL, LDLCALC, TRIG, CHOLHDL, LDLDIRECT in the last 72 hours. Thyroid Function Tests: No results for input(s): TSH, T4TOTAL, FREET4, T3FREE, THYROIDAB in the last 72 hours. Anemia Panel: No results for input(s): VITAMINB12, FOLATE, FERRITIN, TIBC, IRON, RETICCTPCT in the last 72 hours. Urine analysis:    Component Value Date/Time   COLORURINE AMBER (A) 03/07/2020 1107   APPEARANCEUR CLOUDY (A) 03/07/2020 1107   LABSPEC 1.013 03/07/2020 1107   PHURINE 5.0 03/07/2020 1107   GLUCOSEU 50 (A) 03/07/2020 1107   HGBUR LARGE (A) 03/07/2020 1107   BILIRUBINUR NEGATIVE 03/07/2020 1107   KETONESUR NEGATIVE 03/07/2020 1107   PROTEINUR 30 (A) 03/07/2020 1107   UROBILINOGEN 0.2 08/03/2013 2205   NITRITE NEGATIVE 03/07/2020 1107   LEUKOCYTESUR LARGE (A) 03/07/2020 1107    Radiological Exams on Admission: DG Chest Port 1 View  Result Date: 03/07/2020 CLINICAL DATA:  Shortness of breath EXAM: PORTABLE CHEST 1 VIEW COMPARISON:  02/17/2020 FINDINGS: Stable cardiomegaly. Calcific aortic knob. Mild vascular congestion and subtly increased interstitial markings bilaterally. Linear atelectasis within the peripheral aspect of the left mid lung. The visualized skeletal structures are unremarkable. IMPRESSION: Cardiomegaly with mild pulmonary vascular congestion and subtly increased interstitial markings suggesting mild edema. Electronically Signed   By: Davina Poke D.O.   On: 03/07/2020 12:29   US Abdomen Limited RUQ  Result Date: 03/07/2020 CLINICAL DATA:  Right upper quadrant pain and elevated LFTs EXAM: ULTRASOUND ABDOMEN LIMITED RIGHT UPPER QUADRANT COMPARISON:  02/16/2020 FINDINGS: Gallbladder: No gallstones or wall  thickening visualized. No sonographic Murphy sign noted by sonographer. Common bile duct: Diameter: 4 mm Liver: No focal lesion identified. Within normal limits in parenchymal echogenicity. Portal vein is patent on color Doppler imaging with normal direction of blood flow towards the liver. Other: None. IMPRESSION: No acute abnormality noted. Electronically Signed   By: Inez Catalina M.D.   On: 03/07/2020 15:05    EKG: Independently reviewed.  Atrial fibrillation with RBBB  Assessment/Plan Active Problems:  Tobacco abuse   Thrombocytopenia (H[facility])   Essential hypertension   COPD exacerbation ([facility])   Body mass index (BMI) of 40.0-44.9 in adult [facility])   Diabetes mellitus type 2, uncontrolled (H[facility])   Paroxysmal A-fib (HCC)   Acute on chronic diastolic CHF (congestive heart failure) (HCC)   Acute on chronic right-sided heart failure (HCC)   Chronic respiratory failure with hypoxia (HCC)   Acute lower UTI     1. Acute on chronic right-sided congestive heart failure.  Patient had echocardiogram done in 01/2020 that showed ejection fraction of 55 to 60%, but significant RV failure with pulmonary hypertension.  She has been prescribed diuretics at home daughter reports that she has been compliant with these.  She does have evidence of volume overload.  We will start the patient on intravenous Lasix.  We will consult cardiology since she has had recurrent admissions for decompensated CHF.  May benefit from right-sided heart cath.  Check co oximetery panel. 2. COPD exacerbation.  Has bilateral wheezing and diminished breath sounds.  Continue on bronchodilators.  Start on low-dose steroids.  Start on a course of azithromycin.  Since she has been more somnolent lately, will check ABG 3. Lactic acidosis.  She does not appear septic or toxic at this time.  She is afebrile.  Would avoid aggressively hydrating with IV fluids in light of decompensated CHF.  Can consider fluid bolus if she becomes  hypotensive. 4. Acute lower urinary tract infection.  Urinalysis indicates UTI.  Continue on ceftriaxone.  Follow-up urine culture. 5. Tobacco use.  Counseling consider excision.  Provide nicotine patch. 6. Diabetes.  On oral agents which will be held on admission.  Continue on sliding scale insulin. 7. Chronic respiratory failure.  Uses between 2 to 3 L of oxygen at home.  Oxygen requirement currently appears to be at baseline.  Continue to monitor. 8. Paroxysmal atrial fibrillation.  She is currently on metoprolol for rate control.  Will hold this for now since blood pressures are running soft.  She is chronically on Coumadin which was held last week since her INR was supratherapeutic.  Plans are to eventually transition to Xarelto once INR trends down to subtherapeutic range. 9. Elevated liver enzymes.  Right upper quadrant ultrasound is unrevealing she has not had any abdominal pain or vomiting.  Suspect this may be passive congestion related to right-sided heart failure.  Will fractionate bilirubin and check LDH. 10. Chronic kidney disease stage IV.  Creatinine has been trending up over the past several weeks.  Continue to monitor in setting of diuresis.  She will need outpatient follow-up with nephrology. 11. Morbid obesity.  Would benefit from weight loss. 12. Thrombocytopenia.  This is been a chronic issue.  No signs of bleeding at this time.  DVT prophylaxis: On full dose anticoagulation Code Status: Full code Family Communication: Discussed with daughter at the bedside Disposition Plan: Discharge home when she is been adequately diuresed and respiratory status has stabilized Consults called: Cardiology Admission status: Inpatient, telemetry  Kathie Dike MD [facility]   If 7PM-7AM, please contact night-coverage [email or web address]   03/07/2020, 6:22 PM

## 2020-03-07 NOTE — ED Notes (Signed)
Date and time results received: 03/07/20 12:11 PM  (use smartphrase ".now" to insert current time)  Test: Latic Acid Critical Value: 2.7  Name of Provider Notified: [place]  Orders Received? Or Actions Taken?: Orders Received - See Orders for details

## 2020-03-08 ENCOUNTER — Inpatient Hospital Stay (HOSPITAL_COMMUNITY): Payer: Medicare Other

## 2020-03-08 DIAGNOSIS — R601 Generalized edema: Secondary | ICD-10-CM

## 2020-03-08 DIAGNOSIS — Z789 Other specified health status: Secondary | ICD-10-CM

## 2020-03-08 DIAGNOSIS — N184 Chronic kidney disease, stage 4 (severe): Secondary | ICD-10-CM

## 2020-03-08 LAB — CBC WITH DIFFERENTIAL/PLATELET
Abs Immature Granulocytes: 0.11 10*3/uL — ABNORMAL HIGH (ref 0.00–0.07)
Basophils Absolute: 0 10*3/uL (ref 0.0–0.1)
Basophils Relative: 0 %
Eosinophils Absolute: 0 10*3/uL (ref 0.0–0.5)
Eosinophils Relative: 0 %
HCT: 42.3 % (ref 36.0–46.0)
Hemoglobin: 12.3 g/dL (ref 12.0–15.0)
Immature Granulocytes: 1 %
Lymphocytes Relative: 6 %
Lymphs Abs: 0.8 10*3/uL (ref 0.7–4.0)
MCH: 27.3 pg (ref 26.0–34.0)
MCHC: 29.1 g/dL — ABNORMAL LOW (ref 30.0–36.0)
MCV: 94 fL (ref 80.0–100.0)
Monocytes Absolute: 0.2 10*3/uL (ref 0.1–1.0)
Monocytes Relative: 1 %
Neutro Abs: 12 10*3/uL — ABNORMAL HIGH (ref 1.7–7.7)
Neutrophils Relative %: 92 %
Platelets: 75 10*3/uL — ABNORMAL LOW (ref 150–400)
RBC: 4.5 MIL/uL (ref 3.87–5.11)
RDW: 24.5 % — ABNORMAL HIGH (ref 11.5–15.5)
WBC: 13.1 10*3/uL — ABNORMAL HIGH (ref 4.0–10.5)
nRBC: 1.5 % — ABNORMAL HIGH (ref 0.0–0.2)

## 2020-03-08 LAB — GLUCOSE, CAPILLARY
Glucose-Capillary: 145 mg/dL — ABNORMAL HIGH (ref 70–99)
Glucose-Capillary: 165 mg/dL — ABNORMAL HIGH (ref 70–99)
Glucose-Capillary: 157 mg/dL — ABNORMAL HIGH (ref 70–99)
Glucose-Capillary: 228 mg/dL — ABNORMAL HIGH (ref 70–99)

## 2020-03-08 LAB — COMPREHENSIVE METABOLIC PANEL
ALT: 95 U/L — ABNORMAL HIGH (ref 0–44)
AST: 44 U/L — ABNORMAL HIGH (ref 15–41)
Albumin: 2.5 g/dL — ABNORMAL LOW (ref 3.5–5.0)
Alkaline Phosphatase: 295 U/L — ABNORMAL HIGH (ref 38–126)
Anion gap: 17 — ABNORMAL HIGH (ref 5–15)
BUN: 66 mg/dL — ABNORMAL HIGH (ref 6–20)
CO2: 20 mmol/L — ABNORMAL LOW (ref 22–32)
Calcium: 8.7 mg/dL — ABNORMAL LOW (ref 8.9–10.3)
Chloride: 100 mmol/L (ref 98–111)
Creatinine, Ser: 2.34 mg/dL — ABNORMAL HIGH (ref 0.44–1.00)
GFR calc Af Amer: 26 mL/min — ABNORMAL LOW (ref >60)
GFR calc non Af Amer: 22 mL/min — ABNORMAL LOW (ref >60)
Glucose, Bld: 141 mg/dL — ABNORMAL HIGH (ref 70–99)
Potassium: 4.8 mmol/L (ref 3.5–5.1)
Sodium: 137 mmol/L (ref 135–145)
Total Bilirubin: 6.2 mg/dL — ABNORMAL HIGH (ref 0.3–1.2)
Total Protein: 5.4 g/dL — ABNORMAL LOW (ref 6.5–8.1)

## 2020-03-08 LAB — LACTATE DEHYDROGENASE: LDH: 469 U/L — ABNORMAL HIGH (ref 98–192)

## 2020-03-08 MED ORDER — HYDROCODONE-ACETAMINOPHEN 7.5-325 MG PO TABS
1.0000 | ORAL_TABLET | Freq: Three times a day (TID) | ORAL | Status: DC | PRN
  Administered 2020-03-08 – 2020-03-16 (×8): 1 via ORAL
  Filled 2020-03-08 (×9): qty 1

## 2020-03-08 MED ORDER — SODIUM CHLORIDE 0.9% FLUSH
10.0000 mL | INTRAVENOUS | Status: DC | PRN
  Administered 2020-03-09: 10 mL

## 2020-03-08 MED ORDER — CHLORHEXIDINE GLUCONATE CLOTH 2 % EX PADS
6.0000 | MEDICATED_PAD | Freq: Every day | CUTANEOUS | Status: DC
  Administered 2020-03-08 – 2020-03-17 (×8): 6 via TOPICAL

## 2020-03-08 MED ORDER — DIGOXIN 0.25 MG/ML IJ SOLN
0.2500 mg | Freq: Every day | INTRAMUSCULAR | Status: AC
  Administered 2020-03-08: 0.25 mg via INTRAVENOUS
  Filled 2020-03-08: qty 2

## 2020-03-08 MED ORDER — CHLORHEXIDINE GLUCONATE CLOTH 2 % EX PADS
6.0000 | MEDICATED_PAD | Freq: Every day | CUTANEOUS | Status: DC
  Administered 2020-03-09 – 2020-03-15 (×6): 6 via TOPICAL

## 2020-03-08 MED ORDER — SODIUM CHLORIDE 0.9% FLUSH
10.0000 mL | Freq: Two times a day (BID) | INTRAVENOUS | Status: DC
  Administered 2020-03-08 – 2020-03-12 (×9): 10 mL
  Administered 2020-03-13: 20 mL
  Administered 2020-03-14 – 2020-03-17 (×8): 10 mL

## 2020-03-08 MED ORDER — METOPROLOL TARTRATE 25 MG PO TABS
25.0000 mg | ORAL_TABLET | Freq: Two times a day (BID) | ORAL | Status: DC
  Administered 2020-03-08 – 2020-03-09 (×4): 25 mg via ORAL
  Filled 2020-03-08 (×4): qty 1

## 2020-03-08 NOTE — Procedures (Signed)
Procedure Note  03/08/20    Preoperative Diagnosis: Poor access, Chronic renal disease stage IV, Congestive heart failure acute on chronic    Postoperative Diagnosis: Same   Procedure(s) Performed: Central Line placement, right femoral under ultrasound guidance    Surgeon: Lanell Matar. Constance Haw, MD   Assistants: None   Anesthesia: 1% lidocaine    Complications: None    Indications: Ms. Tracy [name]  is a 59 y.o. with CHF, CKD and worsening respiratory failure who has poor access and needs IV lasix and lab draws. She is unable to get a PICC line and is anticoagulated with supratherapeutic INR. I discussed the risk and benefits of placement of the central line with the patient, including but not limited to bleeding, infection, and injury to the vessels. She has given consent for the procedure.    Procedure: The patient placed supine. The right groin was prepped and draped in the usual sterile fashion.  Wearing full gown and gloves, I performed the procedure.  One percent lidocaine was used for local anesthesia. An ultrasound was utilized to assess the right femoral vein.  The needle with syringe was advanced into the vein with dark venous return, and a wire was placed using the Seldinger technique without difficulty.  The skin was knicked and a dilator was placed, and the three lumen catheter was placed over the wire with continued control of the wire.  There was good draw back of blood from all three lumens and each flushed easily with saline.  The catheter was secured with 2-0 silk and a biopatch and dressing was placed.     The patient tolerated the procedure well.  When she has this removed pressure should be held for 30 minutes given her anticoagulation.   Curlene Labrum, MD [facility] [address] [place], [place] [phone] (office)

## 2020-03-08 NOTE — Progress Notes (Signed)
PROGRESS NOTE  Tracy [name] ZYY:[id number] DOB: [birth year] DOA: 03/07/2020 PCP: Rory Percy, MD  Brief History:  59 year old female with a history of chronic respiratory failure on 3 L, diastolic CHF, COPD, tobacco abuse, diabetes mellitus type 2, hypertension, remote cocaine use presenting with 1 week history of worsening lower extremity edema, generalized weakness, and dyspnea on exertion.  The patient has had decreased oral intake.  She was noted to have some confusion with hypoglycemia at home.  As a result, the patient was brought for further evaluation.  Patient had recent admission to the hospital from 02/15/2020 to 02/21/2020 for acute on chronic diastolic CHF.  She was discharged home on furosemide 40 mg twice daily.  Discharge weight was 122.6 kg. Upon presentation, the patient was noted to be fluid overloaded.  Chest x-ray showed pulmonary vascular congestion.  She was started on IV furosemide.  Assessment/Plan: Acute on chronic diastolic CHF/cor pulmonale -Patient remains clinically fluid overloaded -Continue IV furosemide -Daily weights -Accurate I's and O's  COPD exacerbation -Continue Pulmicort -Continue duo nebs -continue IV Solu-Medrol -continue azithromycin  Lactic acidosis -Likely secondary to hypoxia -She does not appear to be septic  Pyuria -UA>50 WBC -Continue ceftriaxone pending culture data  Chronic respiratory failure with hypoxia -Patient is chronically on 3 L nasal cannula  CKD stage IV -Baseline creatinine 2.2-2.6 -will need to tolerate worsening renal function for improved respiratory status  Thrombocytopenia -Has been chronic, likely secondary to hepatic congestion from the patient's cor pulmonale  Paroxysmal atrial fibrillation, with RVR -Restart metoprolol -Restart rivaroxaban  Diabetes mellitus type 2 -NovoLog sliding scale -Holding Jardiance and Amaryl -02/15/20 A1C--7.5  Tobacco abuse -Tobacco cessation  discussed  Chronic leg pain -Restart home dose hydrocodone -Venous duplex  Morbid Obesity -BMI 42.17 -lifestyle modification       Disposition Plan: Patient From: Home D/C Place: Home 2-3  Days when more euvolemic Barriers: Not Clinically Stable--  Family Communication:   Daughter updated at bedside 3/10  Consultants:  none  Code Status:  FULL   DVT Prophylaxis: xarelto   Procedures: As Listed in Progress Note Above  Antibiotics: None      Subjective: Pt is breathing better, but remains sob.  Denies cp, n/v/d,abd pain.  No f/c headache  Objective: Vitals:   03/08/20 0505 03/08/20 0700 03/08/20 0810 03/08/20 0815  BP: 109/81     Pulse: 82     Resp:      Temp: 97.9 F (36.6 C)     TempSrc: Oral     SpO2: 99%  (!) 89% 94%  Weight:  118.5 kg    Height:       No intake or output data in the 24 hours ending 03/08/20 1138 Weight change:  Exam:   General:  Pt is alert, follows commands appropriately, not in acute distress  HEENT: No icterus, No thrush, No neck mass, [place]/AT  Cardiovascular: IRRR, S1/S2, no rubs, no gallops  Respiratory: diminished BS.  Bibasilar rales  Abdomen: Soft/+BS, non tender, non distended, no guarding  Extremities: 2+ LE edema, No lymphangitis, No petechiae, No rashes, no synovitis   Data Reviewed: I have personally reviewed following labs and imaging studies Basic Metabolic Panel: Recent Labs  Lab 03/07/20 1107 03/08/20 0548  NA 138 137  K 3.5 4.8  CL 100 100  CO2 24 20*  GLUCOSE 98 141*  BUN 67* 66*  CREATININE 2.63* 2.34*  CALCIUM 8.2* 8.7*   Liver Function Tests:  Recent Labs  Lab 03/07/20 1107 03/08/20 0548  AST 66* 44*  ALT 123* 95*  ALKPHOS 315* 295*  BILITOT 7.4* 6.2*  PROT 5.7* 5.4*  ALBUMIN 2.9* 2.5*   No results for input(s): LIPASE, AMYLASE in the last 168 hours. No results for input(s): AMMONIA in the last 168 hours. Coagulation Profile: Recent Labs  Lab 03/07/20 1202  INR 3.5*    CBC: Recent Labs  Lab 03/07/20 1107 03/08/20 0548  WBC 17.8* 13.1*  NEUTROABS 16.1* 12.0*  HGB 12.6 12.3  HCT 43.0 42.3  MCV 94.9 94.0  PLT 82* 75*   Cardiac Enzymes: No results for input(s): CKTOTAL, CKMB, CKMBINDEX, TROPONINI in the last 168 hours. BNP: Invalid input(s): POCBNP CBG: Recent Labs  Lab 03/07/20 1111 03/07/20 1300 03/07/20 1426 03/07/20 2200 03/08/20 0739  GLUCAP 81 68* 72 98 145*   HbA1C: No results for input(s): HGBA1C in the last 72 hours. Urine analysis:    Component Value Date/Time   COLORURINE AMBER (A) 03/07/2020 1107   APPEARANCEUR CLOUDY (A) 03/07/2020 1107   LABSPEC 1.013 03/07/2020 1107   PHURINE 5.0 03/07/2020 1107   GLUCOSEU 50 (A) 03/07/2020 1107   HGBUR LARGE (A) 03/07/2020 1107   BILIRUBINUR NEGATIVE 03/07/2020 1107   KETONESUR NEGATIVE 03/07/2020 1107   PROTEINUR 30 (A) 03/07/2020 1107   UROBILINOGEN 0.2 08/03/2013 2205   NITRITE NEGATIVE 03/07/2020 1107   LEUKOCYTESUR LARGE (A) 03/07/2020 1107   Sepsis Labs: @LABRCNTIP (procalcitonin:4,lacticidven:4) ) Recent Results (from the past 240 hour(s))  Respiratory Panel by RT PCR (Flu A&B, Covid) - Nasopharyngeal Swab     Status: None   Collection Time: 03/07/20 11:53 AM   Specimen: Nasopharyngeal Swab  Result Value Ref Range Status   SARS Coronavirus 2 by RT PCR NEGATIVE NEGATIVE Final    Comment: (NOTE) SARS-CoV-2 target nucleic acids are NOT DETECTED. The SARS-CoV-2 RNA is generally detectable in upper respiratoy specimens during the acute phase of infection. The lowest concentration of SARS-CoV-2 viral copies this assay can detect is 131 copies/mL. A negative result does not preclude SARS-Cov-2 infection and should not be used as the sole basis for treatment or other patient management decisions. A negative result may occur with  improper specimen collection/handling, submission of specimen other than nasopharyngeal swab, presence of viral mutation(s) within the areas  targeted by this assay, and inadequate number of viral copies (<131 copies/mL). A negative result must be combined with clinical observations, patient history, and epidemiological information. The expected result is Negative. Fact Sheet for Patients:  PinkCheek.be Fact Sheet for Healthcare Providers:  GravelBags.it This test is not yet ap proved or cleared by the [place] FDA and  has been authorized for detection and/or diagnosis of SARS-CoV-2 by FDA under an Emergency Use Authorization (EUA). This EUA will remain  in effect (meaning this test can be used) for the duration of the COVID-19 declaration under Section 564(b)(1) of the Act, 21 U.S.C. section 360bbb-3(b)(1), unless the authorization is terminated or revoked sooner.    Influenza A by PCR NEGATIVE NEGATIVE Final   Influenza B by PCR NEGATIVE NEGATIVE Final    Comment: (NOTE) The Xpert Xpress SARS-CoV-2/FLU/RSV assay is intended as an aid in  the diagnosis of influenza from Nasopharyngeal swab specimens and  should not be used as a sole basis for treatment. Nasal washings and  aspirates are unacceptable for Xpert Xpress SARS-CoV-2/FLU/RSV  testing. Fact Sheet for Patients: PinkCheek.be Fact Sheet for Healthcare Providers: GravelBags.it This test is not yet approved or cleared by the  [place] Architectural technologist and  has been authorized for detection and/or diagnosis of SARS-CoV-2 by  FDA under an Print production planner (EUA). This EUA will remain  in effect (meaning this test can be used) for the duration of the  Covid-19 declaration under Section 564(b)(1) of the Act, 21  U.S.C. section 360bbb-3(b)(1), unless the authorization is  terminated or revoked. Performed at [facility], [address], [place], [place] [address]      Scheduled Meds: . azithromycin  250 mg Oral Daily  . budesonide (PULMICORT)  nebulizer solution  0.25 mg Nebulization BID  . Chlorhexidine Gluconate Cloth  6 each Topical Daily  . furosemide  60 mg Intravenous BID  . insulin aspart  0-9 Units Subcutaneous TID WC  . ipratropium-albuterol  3 mL Nebulization TID  . methylPREDNISolone (SOLU-MEDROL) injection  40 mg Intravenous Q12H  . nicotine  21 mg Transdermal Daily  . sodium chloride flush  3 mL Intravenous Q12H   Continuous Infusions: . sodium chloride    . cefTRIAXone (ROCEPHIN)  IV 1 g (03/07/20 1850)    Procedures/Studies: DG Chest Port 1 View  Result Date: 03/07/2020 CLINICAL DATA:  Shortness of breath EXAM: PORTABLE CHEST 1 VIEW COMPARISON:  02/17/2020 FINDINGS: Stable cardiomegaly. Calcific aortic knob. Mild vascular congestion and subtly increased interstitial markings bilaterally. Linear atelectasis within the peripheral aspect of the left mid lung. The visualized skeletal structures are unremarkable. IMPRESSION: Cardiomegaly with mild pulmonary vascular congestion and subtly increased interstitial markings suggesting mild edema. Electronically Signed   By: Davina Poke D.O.   On: 03/07/2020 12:29   DG Chest Port 1 View  Result Date: 02/17/2020 CLINICAL DATA:  Shortness of breath EXAM: PORTABLE CHEST 1 VIEW COMPARISON:  February 14, 2020 FINDINGS: There is scarring in the right base region. There is mild atelectasis in the left base. There is stable cardiomegaly with pulmonary venous hypertension. No adenopathy. There is aortic atherosclerosis. No bone lesions. IMPRESSION: Cardiomegaly with pulmonary vascular congestion. Mild scarring right base. Mild atelectasis left base. No appreciable pulmonary edema. Aortic Atherosclerosis (ICD10-I70.0). Electronically Signed   By: Lowella Grip III M.D.   On: 02/17/2020 07:46   DG Chest Port 1 View  Result Date: 02/14/2020 CLINICAL DATA:  Shortness of breath and fluid retention. EXAM: PORTABLE CHEST 1 VIEW COMPARISON:  PA and lateral chest 02/08/2020 and  05/20/2019. FINDINGS: There is marked cardiomegaly and pulmonary vascular congestion. No consolidative process, pneumothorax or effusion. Atherosclerosis is noted. No acute or focal bony abnormality. IMPRESSION: Cardiomegaly and vascular congestion. Atherosclerosis. Electronically Signed   By: Inge Rise M.D.   On: 02/14/2020 17:45   ECHOCARDIOGRAM COMPLETE  Result Date: 02/16/2020    ECHOCARDIOGRAM REPORT   Patient Name:   MARYLON VERNO Date of Exam: 02/16/2020 Medical Rec #:  [id number]             Height:       66.0 in Accession #:    [id number]            Weight:       266.1 lb Date of Birth:  1961/02/05            BSA:          2.26 m Patient Age:    64 years              BP:           126/89 mmHg Patient Gender: F  HR:           61 bpm. Exam Location:  [facility] Procedure: 2D Echo Indications:    Dyspnea 786.09 / R06.00  History:        Patient has prior history of Echocardiogram examinations, most                 recent 09/22/2017. CHF, Stroke and COPD; Risk Factors:Current                 Smoker, Diabetes and Hypertension. Crack cocaine use, RBBB,                 Acute renal failure , LVH , Acute respiratory failure with                 hypoxia.  Sonographer:    Leavy Cella RDCS (AE) Referring Phys: [id number] COURAGE EMOKPAE IMPRESSIONS  1. Left ventricular ejection fraction, by estimation, is 55 to 60%. The left ventricle has normal function. The left ventricle has no regional wall motion abnormalities. There is mild concentric left ventricular hypertrophy. Left ventricular diastolic parameters are indeterminate. Elevated left ventricular end-diastolic pressure.  2. Right ventricular systolic function is severely reduced. The right ventricular size is moderately enlarged. mildly increased right ventricular wall thickness. There is moderately elevated pulmonary artery systolic pressure.  3. Left atrial size was severely dilated.  4. Right atrial size was severely dilated.   5. The mitral valve is grossly normal. Mild mitral valve regurgitation.  6. Tricuspid valve regurgitation is moderate.  7. The aortic valve is tricuspid. Aortic valve regurgitation is not visualized. No aortic stenosis is present.  8. The inferior vena cava is dilated in size with >50% respiratory variability, suggesting right atrial pressure of 8 mmHg. FINDINGS  Left Ventricle: Left ventricular ejection fraction, by estimation, is 55 to 60%. The left ventricle has normal function. The left ventricle has no regional wall motion abnormalities. The left ventricular internal cavity size was normal in size. There is  mild concentric left ventricular hypertrophy. Left ventricular diastolic parameters are indeterminate. Elevated left ventricular end-diastolic pressure. Right Ventricle: The right ventricular size is moderately enlarged. Mildly increased right ventricular wall thickness. Right ventricular systolic function is severely reduced. There is moderately elevated pulmonary artery systolic pressure. The tricuspid  regurgitant velocity is 3.12 m/s, and with an assumed right atrial pressure of 10 mmHg, the estimated right ventricular systolic pressure is 08.1 mmHg. Left Atrium: Left atrial size was severely dilated. Right Atrium: Right atrial size was severely dilated. Pericardium: There is no evidence of pericardial effusion. Mitral Valve: The mitral valve is grossly normal. Mild mitral annular calcification. Mild mitral valve regurgitation. Tricuspid Valve: The tricuspid valve is grossly normal. Tricuspid valve regurgitation is moderate. Aortic Valve: The aortic valve is tricuspid. Aortic valve regurgitation is not visualized. No aortic stenosis is present. Pulmonic Valve: The pulmonic valve was grossly normal. Pulmonic valve regurgitation is not visualized. Aorta: The aortic root is normal in size and structure. Venous: The inferior vena cava is dilated in size with greater than 50% respiratory variability,  suggesting right atrial pressure of 8 mmHg. IAS/Shunts: No atrial level shunt detected by color flow Doppler.  LEFT VENTRICLE PLAX 2D LVIDd:         4.54 cm  Diastology LVIDs:         2.77 cm  LV e' lateral:   11.20 cm/s LV PW:         1.40 cm  LV E/e' lateral: 7.8 LV  IVS:        1.16 cm  LV e' medial:    4.24 cm/s LVOT diam:     2.10 cm  LV E/e' medial:  20.7 LV SV Index:   26.96 LVOT Area:     3.46 cm  RIGHT VENTRICLE RV S prime:     8.05 cm/s LEFT ATRIUM              Index       RIGHT ATRIUM           Index LA diam:        4.40 cm  1.95 cm/m  RA Area:     41.10 cm LA Vol (A2C):   143.0 ml 63.34 ml/m RA Volume:   176.00 ml 77.95 ml/m LA Vol (A4C):   99.1 ml  43.89 ml/m LA Biplane Vol: 121.0 ml 53.59 ml/m   AORTA Ao Root diam: 3.40 cm MITRAL VALVE               TRICUSPID VALVE MV Area (PHT): 4.06 cm    TR Peak grad:   38.9 mmHg MV Decel Time: 187 msec    TR Vmax:        312.00 cm/s MR Peak grad: 62.1 mmHg MR Vmax:      394.00 cm/s  SHUNTS MV E velocity: 87.80 cm/s  Systemic Diam: 2.10 cm MV A velocity: 47.50 cm/s MV E/A ratio:  1.85 Kate Sable MD Electronically signed by Kate Sable MD Signature Date/Time: 02/16/2020/4:32:20 PM    Final    US Abdomen Limited RUQ  Result Date: 03/07/2020 CLINICAL DATA:  Right upper quadrant pain and elevated LFTs EXAM: ULTRASOUND ABDOMEN LIMITED RIGHT UPPER QUADRANT COMPARISON:  02/16/2020 FINDINGS: Gallbladder: No gallstones or wall thickening visualized. No sonographic Murphy sign noted by sonographer. Common bile duct: Diameter: 4 mm Liver: No focal lesion identified. Within normal limits in parenchymal echogenicity. Portal vein is patent on color Doppler imaging with normal direction of blood flow towards the liver. Other: None. IMPRESSION: No acute abnormality noted. Electronically Signed   By: Inez Catalina M.D.   On: 03/07/2020 15:05   US Abdomen Limited RUQ  Result Date: 02/16/2020 CLINICAL DATA:  Bilirubinemia EXAM: ULTRASOUND ABDOMEN LIMITED RIGHT  UPPER QUADRANT COMPARISON:  None. FINDINGS: Gallbladder: No gallstones or wall thickening visualized. No sonographic Murphy sign noted by sonographer. Common bile duct: Diameter: 3 mm Liver: No focal lesion identified. Within normal limits in parenchymal echogenicity. Portal vein is patent on color Doppler imaging with normal direction of blood flow towards the liver. IMPRESSION: Negative right upper quadrant ultrasound. Electronically Signed   By: Monte Fantasia M.D.   On: 02/16/2020 10:40    Orson Eva, DO  [facility]  If 7PM-7AM, please contact night-coverage [email or web address] Password [email or web address] 03/08/2020, 11:38 AM   LOS: 1 day

## 2020-03-09 DIAGNOSIS — N3001 Acute cystitis with hematuria: Secondary | ICD-10-CM

## 2020-03-09 DIAGNOSIS — R7989 Other specified abnormal findings of blood chemistry: Secondary | ICD-10-CM

## 2020-03-09 DIAGNOSIS — R6520 Severe sepsis without septic shock: Secondary | ICD-10-CM

## 2020-03-09 DIAGNOSIS — A4151 Sepsis due to Escherichia coli [E. coli]: Principal | ICD-10-CM

## 2020-03-09 LAB — GLUCOSE, CAPILLARY
Glucose-Capillary: 210 mg/dL — ABNORMAL HIGH (ref 70–99)
Glucose-Capillary: 196 mg/dL — ABNORMAL HIGH (ref 70–99)
Glucose-Capillary: 174 mg/dL — ABNORMAL HIGH (ref 70–99)
Glucose-Capillary: 223 mg/dL — ABNORMAL HIGH (ref 70–99)

## 2020-03-09 LAB — URINE CULTURE: Culture: >=100000 — AB

## 2020-03-09 LAB — PROTIME-INR
INR: 3 — ABNORMAL HIGH (ref 0.8–1.2)
Prothrombin Time: 31.1 seconds — ABNORMAL HIGH (ref 11.4–15.2)

## 2020-03-09 LAB — BASIC METABOLIC PANEL
Anion gap: 17 — ABNORMAL HIGH (ref 5–15)
BUN: 71 mg/dL — ABNORMAL HIGH (ref 6–20)
CO2: 18 mmol/L — ABNORMAL LOW (ref 22–32)
Calcium: 8.7 mg/dL — ABNORMAL LOW (ref 8.9–10.3)
Chloride: 95 mmol/L — ABNORMAL LOW (ref 98–111)
Creatinine, Ser: 2.81 mg/dL — ABNORMAL HIGH (ref 0.44–1.00)
GFR calc Af Amer: 20 mL/min — ABNORMAL LOW (ref >60)
GFR calc non Af Amer: 18 mL/min — ABNORMAL LOW (ref >60)
Glucose, Bld: 203 mg/dL — ABNORMAL HIGH (ref 70–99)
Potassium: 4.5 mmol/L (ref 3.5–5.1)
Sodium: 130 mmol/L — ABNORMAL LOW (ref 135–145)

## 2020-03-09 MED ORDER — APIXABAN 5 MG PO TABS: 5.0000 mg | ORAL_TABLET | Freq: Two times a day (BID) | ORAL | Status: DC

## 2020-03-09 MED ORDER — FUROSEMIDE 40 MG PO TABS
40.0000 mg | ORAL_TABLET | Freq: Two times a day (BID) | ORAL | Status: DC
  Administered 2020-03-10: 40 mg via ORAL
  Filled 2020-03-09: qty 1

## 2020-03-09 MED ORDER — PREDNISONE 20 MG PO TABS
50.0000 mg | ORAL_TABLET | Freq: Every day | ORAL | Status: DC
  Administered 2020-03-10: 50 mg via ORAL
  Filled 2020-03-09: qty 1

## 2020-03-09 NOTE — Progress Notes (Addendum)
Inpatient Diabetes Program Recommendations  AACE/ADA: New Consensus Statement on Inpatient Glycemic Control (2015)  Target Ranges:  Prepandial:   less than 140 mg/dL      Peak postprandial:   less than 180 mg/dL (1-2 hours)      Critically ill patients:  140 - 180 mg/dL   Results for Tracy [name], Tracy [name] (MRN [id number]) as of 03/09/2020 12:17  Ref. Range 03/08/2020 07:39 03/08/2020 12:03 03/08/2020 16:27 03/08/2020 21:11  Glucose-Capillary Latest Ref Range: 70 - 99 mg/dL 145 (H)  1 unit NOVOLOG  165 (H)  2 units NOVOLOG  157 (H)  2 units NOVOLOG  228 (H)   Results for Tracy [name], Tracy [name] (MRN [id number]) as of 03/09/2020 12:17  Ref. Range 03/09/2020 07:55 03/09/2020 11:30  Glucose-Capillary Latest Ref Range: 70 - 99 mg/dL 210 (H)  3 units NOVOLOG  196 (H)      Home DM Meds: Jardiance 25 mg Daily       Amaryl 2 mg Daily  Current Orders: Novolog Sensitive Correction Scale/ SSI (0-9 units) TID AC + HS      MD- Note rise in CBGs since initiation of Solumedrol 40 mg BID.  May consider adding low dose basal insulin to inpatient insulin regimen while pt getting steroids and home oral DM meds are on hold:  Levemir 6 units QHS (0.05 units/kg)     --Will follow patient during hospitalization--  Wyn Quaker RN, MSN, CDE Diabetes Coordinator Inpatient Glycemic Control Team Team Pager: [phone] (8a-5p)

## 2020-03-09 NOTE — Progress Notes (Signed)
ANTICOAGULATION CONSULT NOTE - Initial Consult  Pharmacy Consult for apixaban Indication: atrial fibrillation  No Known Allergies  Patient Measurements: Height: 5\' 6"  (167.6 cm) Weight: 266 lb 1.5 oz (120.7 kg) IBW/kg (Calculated) : 59.3  Vital Signs: Temp: 97.7 F (36.5 C) (03/11 0405) Temp Source: Oral (03/11 0405) BP: 107/64 (03/11 0405) Pulse Rate: 109 (03/11 0405)  Labs: Recent Labs    03/07/20 1107 03/07/20 1202 03/08/20 0548 03/09/20 0447 03/09/20 1040  HGB 12.6  --  12.3  --   --   HCT 43.0  --  42.3  --   --   PLT 82*  --  75*  --   --   LABPROT  --  35.0*  --   --  31.1*  INR  --  3.5*  --   --  3.0*  CREATININE 2.63*  --  2.34* 2.81*  --     Estimated Creatinine Clearance: 28.6 mL/min (A) (by C-G formula based on SCr of 2.81 mg/dL (H)).   Medical History: Past Medical History:  Diagnosis Date  . Acute ischemic stroke ([place]) 07/03/2012  . Crack cocaine use   . Diabetes ([place])   . Diabetes mellitus ([place])   . DVT (deep venous thrombosis) ([place])   . Hemiparesis, acute ([place]) 07/04/2012  . Hypertension   . LVH (left ventricular hypertrophy) 07/04/2012   Ejection fraction 60%.  . Myocardial infarct, old   . Thrombocytopenia ([place]) 07/03/2012  . Tobacco abuse     Medications:  Medications Prior to Admission  Medication Sig Dispense Refill Last Dose  . acetaminophen (TYLENOL) 325 MG tablet Take 2 tablets (650 mg total) by mouth every 6 (six) hours as needed for mild pain (or Fever >/= 101). 12 tablet 0 Past Month at Unknown time  . albuterol (PROVENTIL) (2.5 MG/3ML) 0.083% nebulizer solution Take 3 mLs (2.5 mg total) by nebulization every 4 (four) hours as needed for wheezing or shortness of breath. 75 mL 12 03/06/2020 at Unknown time  . albuterol (VENTOLIN HFA) 108 (90 Base) MCG/ACT inhaler Inhale 2 puffs into the lungs every 4 (four) hours as needed for wheezing or shortness of breath. 18 g 2   . atorvastatin (LIPITOR) 10 MG tablet Take 1 tablet (10 mg total) by  mouth every evening. 30 tablet 2 03/06/2020 at Unknown time  . budesonide-formoterol (SYMBICORT) 160-4.5 MCG/ACT inhaler Inhale 2 puffs into the lungs 2 (two) times daily. 1 Inhaler 12   . furosemide (LASIX) 40 MG tablet Take 1 tablet (40 mg total) by mouth 2 (two) times daily. (Patient taking differently: Take 80 mg by mouth 2 (two) times daily. Increased to 80 mg x 4 days due to fluid in legs) 60 tablet 2 03/06/2020 at Unknown time  . gabapentin (NEURONTIN) 100 MG capsule Take 3 capsules (300 mg total) by mouth 2 (two) times daily. 60 capsule 2 03/06/2020 at Unknown time  . glimepiride (AMARYL) 2 MG tablet Take 1 tablet (2 mg total) by mouth daily with breakfast. 30 tablet 2 03/06/2020 at Unknown time  . HYDROcodone-acetaminophen (NORCO) 7.5-325 MG tablet Take 1 tablet by mouth every 6 (six) hours as needed for moderate pain (Must last 30 days). 60 tablet 0 03/06/2020 at Unknown time  . JARDIANCE 25 MG TABS tablet Take 25 mg by mouth daily. 30 tablet 3 03/03/2020  . metoprolol tartrate (LOPRESSOR) 25 MG tablet Take 1 tablet (25 mg total) by mouth 2 (two) times daily. 60 tablet 2 03/06/2020 at 1900  . potassium chloride SA (  KLOR-CON M20) 20 MEQ tablet Take 20 meq daily for 4 days and then STOP 10 tablet 0 03/06/2020 at Unknown time  . warfarin (COUMADIN) 5 MG tablet Take 5 mg by mouth every morning. as directed  0  at 1000  . guaiFENesin (MUCINEX) 600 MG 12 hr tablet Take 1 tablet (600 mg total) by mouth 2 (two) times daily. (Patient not taking: Reported on 03/07/2020) 20 tablet 1 Not Taking at Unknown time  . nicotine (NICODERM CQ - DOSED IN MG/24 HOURS) 21 mg/24hr patch Place 1 patch (21 mg total) onto the skin daily. (Patient not taking: Reported on 03/07/2020) 28 patch 0 Not Taking at Unknown time  . predniSONE (DELTASONE) 20 MG tablet Take 2 tablets (40 mg total) by mouth daily with breakfast. (Patient not taking: Reported on 03/07/2020) 10 tablet 0 Not Taking at Unknown time  . traZODone (DESYREL) 100 MG tablet Take 1  tablet (100 mg total) by mouth at bedtime. (Patient not taking: Reported on 03/07/2020) 30 tablet 2 Not Taking at Unknown time  . XARELTO 20 MG TABS tablet Take 20 mg by mouth daily.       Assessment: Pharmacy consulted to dose apixaban in patient with atrial fibrillation.  She was on warfarin prior to admission but the plan was to transition to Xarelto once INR trended down- patinet's renal function has worsened and new plan is to transition to apixaban.  INR is still supratherapeutic at 3.0.  Goal of Therapy:   Monitor platelets by anticoagulation protocol: Yes   Plan:  Start apixaban 5 mg BID once INR is < 2. Monitor H&H and s/s of bleeding.  Revonda Standard Torryn Fiske 03/09/2020,11:52 AM

## 2020-03-09 NOTE — Progress Notes (Addendum)
PROGRESS NOTE  CHARRON COULTAS PTW:[id number] DOB: Feb 04, 1961 DOA: 03/07/2020 PCP: Rory Percy, MD  Brief History:  59 year old female with a history of chronic respiratory failure on 3 L, diastolic CHF, COPD, tobacco abuse, diabetes mellitus type 2, hypertension, remote cocaine use presenting with 1 week history of worsening lower extremity edema, generalized weakness, and dyspnea on exertion.  The patient has had decreased oral intake.  She was noted to have some confusion with hypoglycemia at home.  As a result, the patient was brought for further evaluation.  Patient had recent admission to the hospital from 02/15/2020 to 02/21/2020 for acute on chronic diastolic CHF.  Serum creatinine 2.25 on 02/21/20.  Serum creatinine noted to be 2.63 on 02/28/20.  She was discharged home on furosemide 40 mg twice daily.  Discharge weight was 122.6 kg (270lb).  She followed up with cardiology in office on 02/28/20 and she was noted to be fluid overloaded.  Weight on 02/28/20 was 257.2.  Even though her weight was down from time of d/c she was felt to be clinically fluid overloaded and instructed to take lasix 80 mg bid x 4 days, then back to usual 40 mg bid. Upon presentation, the patient was noted to be fluid overloaded.  Chest x-ray showed pulmonary vascular congestion.  She was started on IV furosemide 60 mg bid.  Assessment/Plan: Acute on chronic diastolic CHF/cor pulmonale -Patient remains clinically fluid overloaded -Continue IV furosemide -3/11 standing weight = 262.0 lbs -Accurate I's and O's -body habitus makes fluid status assessment difficult -consult cardiology  COPD exacerbation -Continue Pulmicort -Continue duo nebs -continue IV Solu-Medrol>>>po prednisone -continue azithromycin  Sepsis -present on admission -secondary to UTI -wbc 17.8 with lactate 2.7 -continue ceftriaxone   [place] UTI -UA>50 WBC -Continue ceftriaxone   Chronic respiratory failure with  hypoxia -Patient is chronically on 3 L nasal cannula  CKD stage IV -Baseline creatinine 2.2-2.6 -will need to tolerate worsening renal function for improved respiratory status  Thrombocytopenia -Has been chronic, likely secondary to hepatic congestion from the patient's cor pulmonale  Paroxysmal atrial fibrillation, with RVR -Restart metoprolol -d/c rivaroxaban due to worsen renal function -start apixaban  Diabetes mellitus type 2 -NovoLog sliding scale -Holding Jardiance and Amaryl -02/15/20 A1C--7.5  Hyponatremia -in part due to fluid overload -AM bmp  Tobacco abuse -Tobacco cessation discussed  Chronic leg pain -Restart home dose hydrocodone -Venous duplex--neg for DVT  Morbid Obesity -BMI 42.17 -lifestyle modification       Disposition Plan: Patient From: Home D/C Place: Home 1-2 Days when more euvolemic Barriers: Not Clinically Stable--remains fluid overloaded  Family Communication:   Daughter updated on phone 3/11  Consultants:  none  Code Status:  FULL   DVT Prophylaxis: xarelto   Procedures: As Listed in Progress Note Above  Antibiotics: Ceftriaxone 3/9>> azithro 3/9>>>   Total time spent 35 minutes.  Greater than 50% spent face to face counseling and coordinating care.      Subjective: Patient denies fevers, chills, headache, chest pain, dyspnea, nausea, vomiting, diarrhea, abdominal pain, dysuria, hematuria, hematochezia, and melena.   Objective: Vitals:   03/09/20 0405 03/09/20 0900 03/09/20 1404 03/09/20 1418  BP: 107/64  116/79   Pulse: (!) 109  (!) 103   Resp: 20  20   Temp: 97.7 F (36.5 C)  98.2 F (36.8 C)   TempSrc: Oral  Oral   SpO2: 100% 98% 95% 92%  Weight:      Height:  Intake/Output Summary (Last 24 hours) at 03/09/2020 1514 Last data filed at 03/09/2020 0900 Gross per 24 hour  Intake 781.06 ml  Output 1001 ml  Net -219.94 ml   Weight change: 0.043 kg Exam:   General:  Pt is  alert, follows commands appropriately, not in acute distress  HEENT: No icterus, No thrush, No neck mass, [place]/AT  Cardiovascular: RRR, S1/S2, no rubs, no gallops  Respiratory: diminished breath sounds.  Bibasilar crackles  Abdomen: Soft/+BS, non tender, non distended, no guarding  Extremities: 2+ LE edema, No lymphangitis, No petechiae, No rashes, no synovitis   Data Reviewed: I have personally reviewed following labs and imaging studies Basic Metabolic Panel: Recent Labs  Lab 03/07/20 1107 03/08/20 0548 03/09/20 0447  NA 138 137 130*  K 3.5 4.8 4.5  CL 100 100 95*  CO2 24 20* 18*  GLUCOSE 98 141* 203*  BUN 67* 66* 71*  CREATININE 2.63* 2.34* 2.81*  CALCIUM 8.2* 8.7* 8.7*   Liver Function Tests: Recent Labs  Lab 03/07/20 1107 03/08/20 0548  AST 66* 44*  ALT 123* 95*  ALKPHOS 315* 295*  BILITOT 7.4* 6.2*  PROT 5.7* 5.4*  ALBUMIN 2.9* 2.5*   No results for input(s): LIPASE, AMYLASE in the last 168 hours. No results for input(s): AMMONIA in the last 168 hours. Coagulation Profile: Recent Labs  Lab 03/07/20 1202 03/09/20 1040  INR 3.5* 3.0*   CBC: Recent Labs  Lab 03/07/20 1107 03/08/20 0548  WBC 17.8* 13.1*  NEUTROABS 16.1* 12.0*  HGB 12.6 12.3  HCT 43.0 42.3  MCV 94.9 94.0  PLT 82* 75*   Cardiac Enzymes: No results for input(s): CKTOTAL, CKMB, CKMBINDEX, TROPONINI in the last 168 hours. BNP: Invalid input(s): POCBNP CBG: Recent Labs  Lab 03/08/20 1203 03/08/20 1627 03/08/20 2111 03/09/20 0755 03/09/20 1130  GLUCAP 165* 157* 228* 210* 196*   HbA1C: No results for input(s): HGBA1C in the last 72 hours. Urine analysis:    Component Value Date/Time   COLORURINE AMBER (A) 03/07/2020 1107   APPEARANCEUR CLOUDY (A) 03/07/2020 1107   LABSPEC 1.013 03/07/2020 1107   PHURINE 5.0 03/07/2020 1107   GLUCOSEU 50 (A) 03/07/2020 1107   HGBUR LARGE (A) 03/07/2020 1107   BILIRUBINUR NEGATIVE 03/07/2020 1107   KETONESUR NEGATIVE 03/07/2020 1107    PROTEINUR 30 (A) 03/07/2020 1107   UROBILINOGEN 0.2 08/03/2013 2205   NITRITE NEGATIVE 03/07/2020 1107   LEUKOCYTESUR LARGE (A) 03/07/2020 1107   Sepsis Labs: @LABRCNTIP (procalcitonin:4,lacticidven:4) ) Recent Results (from the past 240 hour(s))  Urine Culture     Status: Abnormal   Collection Time: 03/07/20 11:07 AM   Specimen: Urine, Random  Result Value Ref Range Status   Specimen Description   Final    URINE, RANDOM Performed at [facility], [address] [place], [place] [address]    Special Requests   Final    NONE Performed at [facility], [address], [place], [place] [address]    Culture >=100,000 COLONIES/mL ESCHERICHIA COLI (A)  Final   Report Status 03/09/2020 FINAL  Final   Organism ID, Bacteria ESCHERICHIA COLI (A)  Final      Susceptibility   Escherichia coli - MIC*    AMPICILLIN >=32 RESISTANT Resistant     CEFAZOLIN <=4 SENSITIVE Sensitive     CEFTRIAXONE <=0.25 SENSITIVE Sensitive     CIPROFLOXACIN <=0.25 SENSITIVE Sensitive     GENTAMICIN <=1 SENSITIVE Sensitive     IMIPENEM <=0.25 SENSITIVE Sensitive     NITROFURANTOIN <=16 SENSITIVE Sensitive  TRIMETH/SULFA <=20 SENSITIVE Sensitive     AMPICILLIN/SULBACTAM >=32 RESISTANT Resistant     PIP/TAZO <=4 SENSITIVE Sensitive     * >=100,000 COLONIES/mL ESCHERICHIA COLI  Respiratory Panel by RT PCR (Flu A&B, Covid) - Nasopharyngeal Swab     Status: None   Collection Time: 03/07/20 11:53 AM   Specimen: Nasopharyngeal Swab  Result Value Ref Range Status   SARS Coronavirus 2 by RT PCR NEGATIVE NEGATIVE Final    Comment: (NOTE) SARS-CoV-2 target nucleic acids are NOT DETECTED. The SARS-CoV-2 RNA is generally detectable in upper respiratoy specimens during the acute phase of infection. The lowest concentration of SARS-CoV-2 viral copies this assay can detect is 131 copies/mL. A negative result does not preclude SARS-Cov-2 infection and should not be used as the sole basis for treatment or other patient  management decisions. A negative result may occur with  improper specimen collection/handling, submission of specimen other than nasopharyngeal swab, presence of viral mutation(s) within the areas targeted by this assay, and inadequate number of viral copies (<131 copies/mL). A negative result must be combined with clinical observations, patient history, and epidemiological information. The expected result is Negative. Fact Sheet for Patients:  PinkCheek.be Fact Sheet for Healthcare Providers:  GravelBags.it This test is not yet ap proved or cleared by the [place] FDA and  has been authorized for detection and/or diagnosis of SARS-CoV-2 by FDA under an Emergency Use Authorization (EUA). This EUA will remain  in effect (meaning this test can be used) for the duration of the COVID-19 declaration under Section 564(b)(1) of the Act, 21 U.S.C. section 360bbb-3(b)(1), unless the authorization is terminated or revoked sooner.    Influenza A by PCR NEGATIVE NEGATIVE Final   Influenza B by PCR NEGATIVE NEGATIVE Final    Comment: (NOTE) The Xpert Xpress SARS-CoV-2/FLU/RSV assay is intended as an aid in  the diagnosis of influenza from Nasopharyngeal swab specimens and  should not be used as a sole basis for treatment. Nasal washings and  aspirates are unacceptable for Xpert Xpress SARS-CoV-2/FLU/RSV  testing. Fact Sheet for Patients: PinkCheek.be Fact Sheet for Healthcare Providers: GravelBags.it This test is not yet approved or cleared by the [place] FDA and  has been authorized for detection and/or diagnosis of SARS-CoV-2 by  FDA under an Emergency Use Authorization (EUA). This EUA will remain  in effect (meaning this test can be used) for the duration of the  Covid-19 declaration under Section 564(b)(1) of the Act, 21  U.S.C. section 360bbb-3(b)(1), unless the  authorization is  terminated or revoked. Performed at [facility], [address], [place], [place] [address]      Scheduled Meds: . azithromycin  250 mg Oral Daily  . budesonide (PULMICORT) nebulizer solution  0.25 mg Nebulization BID  . Chlorhexidine Gluconate Cloth  6 each Topical Daily  . Chlorhexidine Gluconate Cloth  6 each Topical Q0600  . furosemide  60 mg Intravenous BID  . insulin aspart  0-9 Units Subcutaneous TID WC  . ipratropium-albuterol  3 mL Nebulization TID  . methylPREDNISolone (SOLU-MEDROL) injection  40 mg Intravenous Q12H  . metoprolol tartrate  25 mg Oral BID  . nicotine  21 mg Transdermal Daily  . sodium chloride flush  10-40 mL Intracatheter Q12H  . sodium chloride flush  3 mL Intravenous Q12H   Continuous Infusions: . sodium chloride    . cefTRIAXone (ROCEPHIN)  IV Stopped (03/08/20 1839)    Procedures/Studies: US Venous Img Lower Bilateral (DVT)  Result Date: 03/08/2020 CLINICAL DATA:  Chronic bilateral lower extremity edema, worsened during the past week. History of previous right lower extremity DVT (femoral and popliteal veins). History of smoking. Evaluate for DVT. EXAM: BILATERAL LOWER EXTREMITY VENOUS DOPPLER ULTRASOUND TECHNIQUE: Gray-scale sonography with graded compression, as well as color Doppler and duplex ultrasound were performed to evaluate the lower extremity deep venous systems from the level of the common femoral vein and including the common femoral, femoral, profunda femoral, popliteal and calf veins including the posterior tibial, peroneal and gastrocnemius veins when visible. The superficial great saphenous vein was also interrogated. Spectral Doppler was utilized to evaluate flow at rest and with distal augmentation maneuvers in the common femoral, femoral and popliteal veins. COMPARISON:  Bilateral lower extremity venous Doppler ultrasound-09/22/2017 (negative). FINDINGS: Examination is degraded due to patient body habitus and poor  sonographic window RIGHT LOWER EXTREMITY Common Femoral Vein: No evidence of thrombus. Normal compressibility, respiratory phasicity and response to augmentation. Saphenofemoral Junction: No evidence of thrombus. Normal compressibility and flow on color Doppler imaging. Profunda Femoral Vein: No evidence of thrombus. Normal compressibility and flow on color Doppler imaging. Femoral Vein: Appears patent where imaged. Popliteal Vein: No evidence of acute or chronic thrombus. Normal compressibility, respiratory phasicity and response to augmentation. Calf Veins: Appear patent where imaged. Superficial Great Saphenous Vein: No evidence of thrombus. Normal compressibility. Venous Reflux:  None. Other Findings:  None. LEFT LOWER EXTREMITY Common Femoral Vein: No evidence of thrombus. Normal compressibility, respiratory phasicity and response to augmentation. Saphenofemoral Junction: No evidence of thrombus. Normal compressibility and flow on color Doppler imaging. Profunda Femoral Vein: No evidence of thrombus. Normal compressibility and flow on color Doppler imaging. Femoral Vein: Appears patent where imaged. Popliteal Vein: No evidence of thrombus. Normal compressibility, respiratory phasicity and response to augmentation. Calf Veins: Appear patent where imaged. Superficial Great Saphenous Vein: No evidence of thrombus. Normal compressibility. Venous Reflux:  None. Other Findings:  None. IMPRESSION: No evidence of acute or chronic DVT within either lower extremity on this body habitus degraded examination. Electronically Signed   By: Sandi Mariscal M.D.   On: 03/08/2020 16:21   DG Chest Port 1 View  Result Date: 03/07/2020 CLINICAL DATA:  Shortness of breath EXAM: PORTABLE CHEST 1 VIEW COMPARISON:  02/17/2020 FINDINGS: Stable cardiomegaly. Calcific aortic knob. Mild vascular congestion and subtly increased interstitial markings bilaterally. Linear atelectasis within the peripheral aspect of the left mid lung. The  visualized skeletal structures are unremarkable. IMPRESSION: Cardiomegaly with mild pulmonary vascular congestion and subtly increased interstitial markings suggesting mild edema. Electronically Signed   By: Davina Poke D.O.   On: 03/07/2020 12:29   DG Chest Port 1 View  Result Date: 02/17/2020 CLINICAL DATA:  Shortness of breath EXAM: PORTABLE CHEST 1 VIEW COMPARISON:  February 14, 2020 FINDINGS: There is scarring in the right base region. There is mild atelectasis in the left base. There is stable cardiomegaly with pulmonary venous hypertension. No adenopathy. There is aortic atherosclerosis. No bone lesions. IMPRESSION: Cardiomegaly with pulmonary vascular congestion. Mild scarring right base. Mild atelectasis left base. No appreciable pulmonary edema. Aortic Atherosclerosis (ICD10-I70.0). Electronically Signed   By: Lowella Grip III M.D.   On: 02/17/2020 07:46   DG Chest Port 1 View  Result Date: 02/14/2020 CLINICAL DATA:  Shortness of breath and fluid retention. EXAM: PORTABLE CHEST 1 VIEW COMPARISON:  PA and lateral chest 02/08/2020 and 05/20/2019. FINDINGS: There is marked cardiomegaly and pulmonary vascular congestion. No consolidative process, pneumothorax or effusion. Atherosclerosis is noted. No acute or focal bony abnormality.  IMPRESSION: Cardiomegaly and vascular congestion. Atherosclerosis. Electronically Signed   By: Inge Rise M.D.   On: 02/14/2020 17:45   ECHOCARDIOGRAM COMPLETE  Result Date: 02/16/2020    ECHOCARDIOGRAM REPORT   Patient Name:   Tracy [name] Date of Exam: 02/16/2020 Medical Rec #:  [id number]             Height:       66.0 in Accession #:    [id number]            Weight:       266.1 lb Date of Birth:  Nov 27, 1961            BSA:          2.26 m Patient Age:    85 years              BP:           126/89 mmHg Patient Gender: F                     HR:           61 bpm. Exam Location:  [facility] Procedure: 2D Echo Indications:    Dyspnea 786.09 /  R06.00  History:        Patient has prior history of Echocardiogram examinations, most                 recent 09/22/2017. CHF, Stroke and COPD; Risk Factors:Current                 Smoker, Diabetes and Hypertension. Crack cocaine use, RBBB,                 Acute renal failure , LVH , Acute respiratory failure with                 hypoxia.  Sonographer:    Leavy Cella RDCS (AE) Referring Phys: [id number] COURAGE EMOKPAE IMPRESSIONS  1. Left ventricular ejection fraction, by estimation, is 55 to 60%. The left ventricle has normal function. The left ventricle has no regional wall motion abnormalities. There is mild concentric left ventricular hypertrophy. Left ventricular diastolic parameters are indeterminate. Elevated left ventricular end-diastolic pressure.  2. Right ventricular systolic function is severely reduced. The right ventricular size is moderately enlarged. mildly increased right ventricular wall thickness. There is moderately elevated pulmonary artery systolic pressure.  3. Left atrial size was severely dilated.  4. Right atrial size was severely dilated.  5. The mitral valve is grossly normal. Mild mitral valve regurgitation.  6. Tricuspid valve regurgitation is moderate.  7. The aortic valve is tricuspid. Aortic valve regurgitation is not visualized. No aortic stenosis is present.  8. The inferior vena cava is dilated in size with >50% respiratory variability, suggesting right atrial pressure of 8 mmHg. FINDINGS  Left Ventricle: Left ventricular ejection fraction, by estimation, is 55 to 60%. The left ventricle has normal function. The left ventricle has no regional wall motion abnormalities. The left ventricular internal cavity size was normal in size. There is  mild concentric left ventricular hypertrophy. Left ventricular diastolic parameters are indeterminate. Elevated left ventricular end-diastolic pressure. Right Ventricle: The right ventricular size is moderately enlarged. Mildly increased right  ventricular wall thickness. Right ventricular systolic function is severely reduced. There is moderately elevated pulmonary artery systolic pressure. The tricuspid  regurgitant velocity is 3.12 m/s, and with an assumed right atrial pressure of 10 mmHg, the estimated right ventricular systolic pressure is 60.7 mmHg.  Left Atrium: Left atrial size was severely dilated. Right Atrium: Right atrial size was severely dilated. Pericardium: There is no evidence of pericardial effusion. Mitral Valve: The mitral valve is grossly normal. Mild mitral annular calcification. Mild mitral valve regurgitation. Tricuspid Valve: The tricuspid valve is grossly normal. Tricuspid valve regurgitation is moderate. Aortic Valve: The aortic valve is tricuspid. Aortic valve regurgitation is not visualized. No aortic stenosis is present. Pulmonic Valve: The pulmonic valve was grossly normal. Pulmonic valve regurgitation is not visualized. Aorta: The aortic root is normal in size and structure. Venous: The inferior vena cava is dilated in size with greater than 50% respiratory variability, suggesting right atrial pressure of 8 mmHg. IAS/Shunts: No atrial level shunt detected by color flow Doppler.  LEFT VENTRICLE PLAX 2D LVIDd:         4.54 cm  Diastology LVIDs:         2.77 cm  LV e' lateral:   11.20 cm/s LV PW:         1.40 cm  LV E/e' lateral: 7.8 LV IVS:        1.16 cm  LV e' medial:    4.24 cm/s LVOT diam:     2.10 cm  LV E/e' medial:  20.7 LV SV Index:   26.96 LVOT Area:     3.46 cm  RIGHT VENTRICLE RV S prime:     8.05 cm/s LEFT ATRIUM              Index       RIGHT ATRIUM           Index LA diam:        4.40 cm  1.95 cm/m  RA Area:     41.10 cm LA Vol (A2C):   143.0 ml 63.34 ml/m RA Volume:   176.00 ml 77.95 ml/m LA Vol (A4C):   99.1 ml  43.89 ml/m LA Biplane Vol: 121.0 ml 53.59 ml/m   AORTA Ao Root diam: 3.40 cm MITRAL VALVE               TRICUSPID VALVE MV Area (PHT): 4.06 cm    TR Peak grad:   38.9 mmHg MV Decel Time: 187 msec     TR Vmax:        312.00 cm/s MR Peak grad: 62.1 mmHg MR Vmax:      394.00 cm/s  SHUNTS MV E velocity: 87.80 cm/s  Systemic Diam: 2.10 cm MV A velocity: 47.50 cm/s MV E/A ratio:  1.85 Kate Sable MD Electronically signed by Kate Sable MD Signature Date/Time: 02/16/2020/4:32:20 PM    Final    US Abdomen Limited RUQ  Result Date: 03/07/2020 CLINICAL DATA:  Right upper quadrant pain and elevated LFTs EXAM: ULTRASOUND ABDOMEN LIMITED RIGHT UPPER QUADRANT COMPARISON:  02/16/2020 FINDINGS: Gallbladder: No gallstones or wall thickening visualized. No sonographic Murphy sign noted by sonographer. Common bile duct: Diameter: 4 mm Liver: No focal lesion identified. Within normal limits in parenchymal echogenicity. Portal vein is patent on color Doppler imaging with normal direction of blood flow towards the liver. Other: None. IMPRESSION: No acute abnormality noted. Electronically Signed   By: Inez Catalina M.D.   On: 03/07/2020 15:05   US Abdomen Limited RUQ  Result Date: 02/16/2020 CLINICAL DATA:  Bilirubinemia EXAM: ULTRASOUND ABDOMEN LIMITED RIGHT UPPER QUADRANT COMPARISON:  None. FINDINGS: Gallbladder: No gallstones or wall thickening visualized. No sonographic Murphy sign noted by sonographer. Common bile duct: Diameter: 3 mm Liver: No focal lesion identified. Within normal  limits in parenchymal echogenicity. Portal vein is patent on color Doppler imaging with normal direction of blood flow towards the liver. IMPRESSION: Negative right upper quadrant ultrasound. Electronically Signed   By: Monte Fantasia M.D.   On: 02/16/2020 10:40    Orson Eva, DO  [facility]  If 7PM-7AM, please contact night-coverage [email or web address] Password [email or web address] 03/09/2020, 3:14 PM   LOS: 2 days

## 2020-03-09 NOTE — Progress Notes (Signed)
Tele called with wide QRS for 11 beats.  BP just now 108/68.  Notified Dr. Carles Collet

## 2020-03-10 DIAGNOSIS — I5033 Acute on chronic diastolic (congestive) heart failure: Secondary | ICD-10-CM

## 2020-03-10 DIAGNOSIS — I4819 Other persistent atrial fibrillation: Secondary | ICD-10-CM

## 2020-03-10 DIAGNOSIS — I2781 Cor pulmonale (chronic): Secondary | ICD-10-CM

## 2020-03-10 DIAGNOSIS — N184 Chronic kidney disease, stage 4 (severe): Secondary | ICD-10-CM

## 2020-03-10 DIAGNOSIS — J441 Chronic obstructive pulmonary disease with (acute) exacerbation: Secondary | ICD-10-CM

## 2020-03-10 LAB — HEPATIC FUNCTION PANEL
ALT: 71 U/L — ABNORMAL HIGH (ref 0–44)
AST: 22 U/L (ref 15–41)
Albumin: 2.5 g/dL — ABNORMAL LOW (ref 3.5–5.0)
Alkaline Phosphatase: 286 U/L — ABNORMAL HIGH (ref 38–126)
Bilirubin, Direct: 2.8 mg/dL — ABNORMAL HIGH (ref 0.0–0.2)
Indirect Bilirubin: 2.3 mg/dL — ABNORMAL HIGH (ref 0.3–0.9)
Total Bilirubin: 5.1 mg/dL — ABNORMAL HIGH (ref 0.3–1.2)
Total Protein: 5.4 g/dL — ABNORMAL LOW (ref 6.5–8.1)

## 2020-03-10 LAB — GLUCOSE, CAPILLARY
Glucose-Capillary: 194 mg/dL — ABNORMAL HIGH (ref 70–99)
Glucose-Capillary: 222 mg/dL — ABNORMAL HIGH (ref 70–99)
Glucose-Capillary: 237 mg/dL — ABNORMAL HIGH (ref 70–99)
Glucose-Capillary: 215 mg/dL — ABNORMAL HIGH (ref 70–99)

## 2020-03-10 LAB — PROTIME-INR
INR: 2.9 — ABNORMAL HIGH (ref 0.8–1.2)
Prothrombin Time: 30.4 seconds — ABNORMAL HIGH (ref 11.4–15.2)

## 2020-03-10 LAB — BASIC METABOLIC PANEL
Anion gap: 17 — ABNORMAL HIGH (ref 5–15)
BUN: 83 mg/dL — ABNORMAL HIGH (ref 6–20)
CO2: 19 mmol/L — ABNORMAL LOW (ref 22–32)
Calcium: 8.6 mg/dL — ABNORMAL LOW (ref 8.9–10.3)
Chloride: 97 mmol/L — ABNORMAL LOW (ref 98–111)
Creatinine, Ser: 2.73 mg/dL — ABNORMAL HIGH (ref 0.44–1.00)
GFR calc Af Amer: 21 mL/min — ABNORMAL LOW (ref >60)
GFR calc non Af Amer: 18 mL/min — ABNORMAL LOW (ref >60)
Glucose, Bld: 207 mg/dL — ABNORMAL HIGH (ref 70–99)
Potassium: 4.2 mmol/L (ref 3.5–5.1)
Sodium: 133 mmol/L — ABNORMAL LOW (ref 135–145)

## 2020-03-10 LAB — BRAIN NATRIURETIC PEPTIDE: B Natriuretic Peptide: 697 pg/mL — ABNORMAL HIGH (ref 0.0–100.0)

## 2020-03-10 MED ORDER — TRAZODONE HCL 50 MG PO TABS
50.0000 mg | ORAL_TABLET | Freq: Every evening | ORAL | Status: AC | PRN
  Administered 2020-03-10: 50 mg via ORAL
  Filled 2020-03-10: qty 1

## 2020-03-10 MED ORDER — FUROSEMIDE 10 MG/ML IJ SOLN
40.0000 mg | Freq: Two times a day (BID) | INTRAMUSCULAR | Status: DC
  Administered 2020-03-10 – 2020-03-12 (×5): 40 mg via INTRAVENOUS
  Filled 2020-03-10 (×6): qty 4

## 2020-03-10 MED ORDER — PREDNISONE 20 MG PO TABS
40.0000 mg | ORAL_TABLET | Freq: Every day | ORAL | Status: DC
  Administered 2020-03-11: 40 mg via ORAL
  Filled 2020-03-10 (×2): qty 2

## 2020-03-10 MED ORDER — METOPROLOL TARTRATE 25 MG PO TABS
37.5000 mg | ORAL_TABLET | Freq: Two times a day (BID) | ORAL | Status: DC
  Administered 2020-03-10 – 2020-03-11 (×3): 37.5 mg via ORAL
  Filled 2020-03-10 (×4): qty 2

## 2020-03-10 NOTE — Plan of Care (Signed)

## 2020-03-10 NOTE — Consult Note (Addendum)
Cardiology Consult    Patient ID: Tracy [name]; [id number]; 05/15/1961   Admit date: 03/07/2020 Date of Consult: 03/10/2020  Primary Care Provider: Rory Percy, MD Primary Cardiologist: Kate Sable, MD   Patient Profile    Tracy [name] is a 59 y.o. 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 Carles Collet.   History of Present Illness    Tracy [name] was admitted to [facility] from 2/16 - 02/21/2020 for an acute CHF exacerbation after having left AMA from [facility]. Repeat echo showed a preserved EF of 55-60% but she was noted to have mild LVH, severely reduced RV function, moderately elevated PA pressures, severe biatrial dilation, mild MR and moderate TR. She did have episodes of paroxysmal atrial fibrillation dueing admission and was continued on Coumadin and Lopressor 25mg  BID. Weight was 269 lbs at the time of discharge and she was transitioned to Lasix 40mg  BID at discharge.   She was evaluated by Ermalinda Barrios, PA-C on 02/28/2020 and weight had declined to 257 lbs but she still had significant pitting edema. She was consuming a high-salt diet. Lasix was increased to 80mg  BID for 4 days then to return to 40mg  BID. BNP was elevated to 661 and creatinine had acutely worsened from 2.25 to 2.69. She has called the office in the interim reporting weight gain and Lasix was increased to 80mg  BID again on 03/06/2020.  She presented to [facility] ED on 03/07/2020 for evaluation of hypoglycemia and AMS. Initial labs showed WBC 17.8, Hgb 12.6, platelets 82, Na+  138, K+ 3.5 and creatinine 2.63. Lactic Acid 2.7. BNP 544. COVID negative. CXR with cardiomegaly and mild pulmonary vascular congestion. EKG appears most consistent with atrial fibrillation, HR 106 with known RBBB.   She was admitted for sepsis secondary to E.coli UTI and is also being treated for COPD and CHF exacerbations.   She has been receiving IV Lasix 60mg  BID with a recorded output of -1.3L and standing weight on 3/10 was 261 lbs (recorded as 266 lbs and 265 lbs for the past two days as bed weights). Creatinine was trending upwards to 2.81 on 3/11, at 2.73 today.  In talking with the patient today, she requests salt to put on her eggs. Says her breathing has improved and she denies any chest pain or palpitations. Still with significant edema.   Past Medical History:  Diagnosis Date  . Acute ischemic stroke ([place]) 07/03/2012  . Crack cocaine use   . Diabetes ([place])   . Diabetes mellitus ([place])   . DVT (deep venous thrombosis) ([place])   . Hemiparesis, acute ([place]) 07/04/2012  . Hypertension   . LVH (left ventricular hypertrophy) 07/04/2012   Ejection fraction 60%.  . Myocardial infarct, old   . Thrombocytopenia ([place]) 07/03/2012  . Tobacco abuse     Past Surgical History:  Procedure Laterality Date  . ABDOMINAL HYSTERECTOMY    . HERNIA REPAIR       Home Medications:  Prior to Admission medications   Medication Sig Start Date End Date Taking? Authorizing Provider  acetaminophen (TYLENOL) 325 MG tablet Take 2  tablets (650 mg total) by mouth every 6 (six) hours as needed for mild pain (or Fever >/= 101). 02/21/20  Yes Emokpae, Courage, MD  albuterol (PROVENTIL) (2.5 MG/3ML) 0.083% nebulizer solution Take 3 mLs (2.5 mg total) by nebulization every 4 (four) hours as needed for wheezing or shortness of breath. 02/21/20  Yes Emokpae, Courage, MD  albuterol (VENTOLIN HFA) 108 (90 Base) MCG/ACT inhaler Inhale 2 puffs into the lungs every 4 (four) hours as needed for wheezing or shortness of breath.  02/21/20  Yes Emokpae, Courage, MD  atorvastatin (LIPITOR) 10 MG tablet Take 1 tablet (10 mg total) by mouth every evening. 02/21/20  Yes Emokpae, Courage, MD  budesonide-formoterol (SYMBICORT) 160-4.5 MCG/ACT inhaler Inhale 2 puffs into the lungs 2 (two) times daily. 02/21/20  Yes Emokpae, Courage, MD  furosemide (LASIX) 40 MG tablet Take 1 tablet (40 mg total) by mouth 2 (two) times daily. Patient taking differently: Take 80 mg by mouth 2 (two) times daily. Increased to 80 mg x 4 days due to fluid in legs 02/21/20  Yes Emokpae, Courage, MD  gabapentin (NEURONTIN) 100 MG capsule Take 3 capsules (300 mg total) by mouth 2 (two) times daily. 02/21/20  Yes Roxan Hockey, MD  glimepiride (AMARYL) 2 MG tablet Take 1 tablet (2 mg total) by mouth daily with breakfast. 02/21/20 02/20/21 Yes Emokpae, Courage, MD  HYDROcodone-acetaminophen (NORCO) 7.5-325 MG tablet Take 1 tablet by mouth every 6 (six) hours as needed for moderate pain (Must last 30 days). 03/06/20  Yes Sanjuana Kava, MD  JARDIANCE 25 MG TABS tablet Take 25 mg by mouth daily. 02/21/20  Yes Roxan Hockey, MD  metoprolol tartrate (LOPRESSOR) 25 MG tablet Take 1 tablet (25 mg total) by mouth 2 (two) times daily. 02/21/20  Yes Emokpae, Courage, MD  potassium chloride SA (KLOR-CON M20) 20 MEQ tablet Take 20 meq daily for 4 days and then STOP 02/28/20  Yes Imogene Burn, PA-C  warfarin (COUMADIN) 5 MG tablet Take 5 mg by mouth every morning. as directed 05/26/18  Yes [provider]  guaiFENesin (MUCINEX) 600 MG 12 hr tablet Take 1 tablet (600 mg total) by mouth 2 (two) times daily. Patient not taking: Reported on 03/07/2020 02/21/20   Roxan Hockey, MD  nicotine (NICODERM CQ - DOSED IN MG/24 HOURS) 21 mg/24hr patch Place 1 patch (21 mg total) onto the skin daily. Patient not taking: Reported on 03/07/2020 02/22/20   Roxan Hockey, MD  predniSONE (DELTASONE) 20 MG tablet Take 2 tablets (40 mg total) by mouth daily with breakfast. Patient not  taking: Reported on 03/07/2020 02/21/20   Roxan Hockey, MD  traZODone (DESYREL) 100 MG tablet Take 1 tablet (100 mg total) by mouth at bedtime. Patient not taking: Reported on 03/07/2020 02/21/20   Roxan Hockey, MD  XARELTO 20 MG TABS tablet Take 20 mg by mouth daily. 02/24/20   [provider]    Inpatient Medications: Scheduled Meds: . azithromycin  250 mg Oral Daily  . budesonide (PULMICORT) nebulizer solution  0.25 mg Nebulization BID  . Chlorhexidine Gluconate Cloth  6 each Topical Daily  . Chlorhexidine Gluconate Cloth  6 each Topical Q0600  . furosemide  40 mg Oral BID  . insulin aspart  0-9 Units Subcutaneous TID WC  . ipratropium-albuterol  3 mL Nebulization TID  . metoprolol tartrate  37.5 mg Oral BID  . nicotine  21 mg Transdermal Daily  . predniSONE  50 mg Oral Q breakfast  . sodium chloride flush  10-40 mL Intracatheter  Q12H  . sodium chloride flush  3 mL Intravenous Q12H   Continuous Infusions: . sodium chloride    . cefTRIAXone (ROCEPHIN)  IV 1 g (03/09/20 1735)   PRN Meds: sodium chloride, acetaminophen, albuterol, HYDROcodone-acetaminophen, ondansetron (ZOFRAN) IV, sodium chloride flush, sodium chloride flush  Allergies:   No Known Allergies  Social History:   Social History   Socioeconomic History  . Marital status: Widowed    Spouse name: Not on file  . Number of children: Not on file  . Years of education: Not on file  . Highest education level: Not on file  Occupational History  . Not on file  Tobacco Use  . Smoking status: Current Every Day Smoker    Packs/day: 1.00    Types: Cigarettes  . Smokeless tobacco: Never Used  Substance and Sexual Activity  . Alcohol use: No  . Drug use: No    Comment: last used 2 years ago  . Sexual activity: Never    Birth control/protection: Surgical  Other Topics Concern  . Not on file  Social History Narrative  . Not on file   Social Determinants of Health   Financial Resource Strain:   .  Difficulty of Paying Living Expenses:   Food Insecurity:   . Worried About Charity fundraiser in the Last Year:   . Arboriculturist in the Last Year:   Transportation Needs:   . Film/video editor (Medical):   [name] Lack of Transportation (Non-Medical):   Physical Activity:   . Days of Exercise per Week:   . Minutes of Exercise per Session:   Stress:   . Feeling of Stress :   Social Connections:   . Frequency of Communication with Friends and Family:   . Frequency of Social Gatherings with Friends and Family:   . Attends Religious Services:   . Active Member of Clubs or Organizations:   . Attends Archivist Meetings:   [name] Marital Status:   Intimate Partner Violence:   . Fear of Current or Ex-Partner:   . Emotionally Abused:   [name] Physically Abused:   . Sexually Abused:      Family History:    Family History  Problem Relation Age of Onset  . Heart disease Mother   . Diabetes Mother   . Stroke Mother   . Hypertension Mother   . Hypertension Father   . Heart disease Father   . Diabetes Brother       Review of Systems    General:  No chills, fever, night sweats or weight changes.  Cardiovascular:  No chest pain, orthopnea, palpitations, paroxysmal nocturnal dyspnea. Positive for dyspnea on exertion and edema.  Dermatological: No rash, lesions/masses Respiratory: No cough, dyspnea Urologic: No hematuria, dysuria Abdominal:   No nausea, vomiting, diarrhea, bright red blood per rectum, melena, or hematemesis Neurologic:  No visual changes, wkns, changes in mental status. All other systems reviewed and are otherwise negative except as noted above.  Physical Exam/Data    Vitals:   03/09/20 2126 03/10/20 0332 03/10/20 0438 03/10/20 0803  BP: 103/79  101/73   Pulse: (!) 104  (!) 107   Resp: 20  20   Temp: 98 F (36.7 C)  97.8 F (36.6 C)   TempSrc: Oral     SpO2: 98%  98% 94%  Weight:  120.6 kg    Height:        Intake/Output Summary (Last 24 hours) at  03/10/2020 0901 Last data  filed at 03/10/2020 0500 Gross per 24 hour  Intake --  Output 1150 ml  Net -1150 ml   Filed Weights   03/08/20 0700 03/09/20 0330 03/10/20 0332  Weight: 118.5 kg 120.7 kg 120.6 kg   Body mass index is 42.91 kg/m.   General: Pleasant female appearing in NAD Psych: Normal affect. Neuro: Alert and oriented X 3. Moves all extremities spontaneously. HEENT: Normal  Neck: Supple without bruits. JVD at 9 cm. Lungs:  Resp regular and unlabored, rales along bases bilaterally. Heart: Irregularly irregular. no s3, s4, or murmurs. Abdomen: Soft, non-tender, non-distended, BS + x 4.  Extremities: No clubbing or cyanosis. Chronic appearing 2+ pitting edema. DP/PT/Radials 2+ and equal bilaterally.   EKG:  The EKG was personally reviewed and demonstrates: Atrial fibrillation, HR 106 with known RBBB.   Telemetry:  Telemetry was personally reviewed and demonstrates: Atrial fibrillation, HR in 90's to 110's. Episodes of NSVT up to 11 beats.    Labs/Studies     Relevant CV Studies:  NST: 01/2018  There was no ST segment deviation noted during stress.  Defect 1: There is a small defect of mild severity present in the mid anterolateral location. This is due to soft tissue attenuation. Regional wall motion is normal. Stress images are better than rest. No evidence of ischemia.  This is a low risk study.  Nuclear stress EF: 75%.  Echocardiogram: 02/16/2020 IMPRESSIONS    1. Left ventricular ejection fraction, by estimation, is 55 to 60%. The  left ventricle has normal function. The left ventricle has no regional  wall motion abnormalities. There is mild concentric left ventricular  hypertrophy. Left ventricular diastolic  parameters are indeterminate. Elevated left ventricular end-diastolic  pressure.  2. Right ventricular systolic function is severely reduced. The right  ventricular size is moderately enlarged. mildly increased right  ventricular wall  thickness. There is moderately elevated pulmonary artery  systolic pressure.  3. Left atrial size was severely dilated.  4. Right atrial size was severely dilated.  5. The mitral valve is grossly normal. Mild mitral valve regurgitation.  6. Tricuspid valve regurgitation is moderate.  7. The aortic valve is tricuspid. Aortic valve regurgitation is not  visualized. No aortic stenosis is present.  8. The inferior vena cava is dilated in size with >50% respiratory  variability, suggesting right atrial pressure of 8 mmHg.   Laboratory Data:  Chemistry Recent Labs  Lab 03/08/20 0548 03/09/20 0447 03/10/20 0450  NA 137 130* 133*  K 4.8 4.5 4.2  CL 100 95* 97*  CO2 20* 18* 19*  GLUCOSE 141* 203* 207*  BUN 66* 71* 83*  CREATININE 2.34* 2.81* 2.73*  CALCIUM 8.7* 8.7* 8.6*  GFRNONAA 22* 18* 18*  GFRAA 26* 20* 21*  ANIONGAP 17* 17* 17*    Recent Labs  Lab 03/07/20 1107 03/08/20 0548 03/10/20 0450  PROT 5.7* 5.4* 5.4*  ALBUMIN 2.9* 2.5* 2.5*  AST 66* 44* 22  ALT 123* 95* 71*  ALKPHOS 315* 295* 286*  BILITOT 7.4* 6.2* 5.1*   Hematology Recent Labs  Lab 03/07/20 1107 03/08/20 0548  WBC 17.8* 13.1*  RBC 4.53 4.50  HGB 12.6 12.3  HCT 43.0 42.3  MCV 94.9 94.0  MCH 27.8 27.3  MCHC 29.3* 29.1*  RDW 24.6* 24.5*  PLT 82* 75*   Cardiac EnzymesNo results for input(s): TROPONINI in the last 168 hours. No results for input(s): TROPIPOC in the last 168 hours.  BNP Recent Labs  Lab 03/07/20 1152 03/10/20 0450  BNP  544.0* 697.0*    DDimer No results for input(s): DDIMER in the last 168 hours.  Radiology/Studies:  US Venous Img Lower Bilateral (DVT)  Result Date: 03/08/2020 CLINICAL DATA:  Chronic bilateral lower extremity edema, worsened during the past week. History of previous right lower extremity DVT (femoral and popliteal veins). History of smoking. Evaluate for DVT. EXAM: BILATERAL LOWER EXTREMITY VENOUS DOPPLER ULTRASOUND TECHNIQUE: Gray-scale sonography with  graded compression, as well as color Doppler and duplex ultrasound were performed to evaluate the lower extremity deep venous systems from the level of the common femoral vein and including the common femoral, femoral, profunda femoral, popliteal and calf veins including the posterior tibial, peroneal and gastrocnemius veins when visible. The superficial great saphenous vein was also interrogated. Spectral Doppler was utilized to evaluate flow at rest and with distal augmentation maneuvers in the common femoral, femoral and popliteal veins. COMPARISON:  Bilateral lower extremity venous Doppler ultrasound-09/22/2017 (negative). FINDINGS: Examination is degraded due to patient body habitus and poor sonographic window RIGHT LOWER EXTREMITY Common Femoral Vein: No evidence of thrombus. Normal compressibility, respiratory phasicity and response to augmentation. Saphenofemoral Junction: No evidence of thrombus. Normal compressibility and flow on color Doppler imaging. Profunda Femoral Vein: No evidence of thrombus. Normal compressibility and flow on color Doppler imaging. Femoral Vein: Appears patent where imaged. Popliteal Vein: No evidence of acute or chronic thrombus. Normal compressibility, respiratory phasicity and response to augmentation. Calf Veins: Appear patent where imaged. Superficial Great Saphenous Vein: No evidence of thrombus. Normal compressibility. Venous Reflux:  None. Other Findings:  None. LEFT LOWER EXTREMITY Common Femoral Vein: No evidence of thrombus. Normal compressibility, respiratory phasicity and response to augmentation. Saphenofemoral Junction: No evidence of thrombus. Normal compressibility and flow on color Doppler imaging. Profunda Femoral Vein: No evidence of thrombus. Normal compressibility and flow on color Doppler imaging. Femoral Vein: Appears patent where imaged. Popliteal Vein: No evidence of thrombus. Normal compressibility, respiratory phasicity and response to augmentation. Calf  Veins: Appear patent where imaged. Superficial Great Saphenous Vein: No evidence of thrombus. Normal compressibility. Venous Reflux:  None. Other Findings:  None. IMPRESSION: No evidence of acute or chronic DVT within either lower extremity on this body habitus degraded examination. Electronically Signed   By: Sandi Mariscal M.D.   On: 03/08/2020 16:21   DG Chest Port 1 View  Result Date: 03/07/2020 CLINICAL DATA:  Shortness of breath EXAM: PORTABLE CHEST 1 VIEW COMPARISON:  02/17/2020 FINDINGS: Stable cardiomegaly. Calcific aortic knob. Mild vascular congestion and subtly increased interstitial markings bilaterally. Linear atelectasis within the peripheral aspect of the left mid lung. The visualized skeletal structures are unremarkable. IMPRESSION: Cardiomegaly with mild pulmonary vascular congestion and subtly increased interstitial markings suggesting mild edema. Electronically Signed   By: Davina Poke D.O.   On: 03/07/2020 12:29   US Abdomen Limited RUQ  Result Date: 03/07/2020 CLINICAL DATA:  Right upper quadrant pain and elevated LFTs EXAM: ULTRASOUND ABDOMEN LIMITED RIGHT UPPER QUADRANT COMPARISON:  02/16/2020 FINDINGS: Gallbladder: No gallstones or wall thickening visualized. No sonographic Murphy sign noted by sonographer. Common bile duct: Diameter: 4 mm Liver: No focal lesion identified. Within normal limits in parenchymal echogenicity. Portal vein is patent on color Doppler imaging with normal direction of blood flow towards the liver. Other: None. IMPRESSION: No acute abnormality noted. Electronically Signed   By: Inez Catalina M.D.   On: 03/07/2020 15:05     Assessment & Plan    1. Acute on Chronic Diastolic CHF/ RV Dysfunction - most recent echo last  month showed a preserved EF of 55-60% but she was noted to have mild LVH, severely reduced RV function, moderately elevated PA pressures, severe biatrial dilation, mild MR and moderate TR.  -  BNP elevated to 544 on admission and CXR showed  cardiomegaly and mild pulmonary vascular congestion.  -  Was receiving IV Lasix 60mg  BID with a recorded output of -1.3L and standing weight on 3/10 was 261 lbs (recorded as 266 lbs and 265 lbs for the past two days as bed weights). Creatinine was trending upwards to 2.81 on 3/11, at 2.73 today. Agree with reducing Lasix to 40mg  BID today. She still appears volume overloaded by examination and there is an unclear dry weight (was 257 lbs at prior visit). She was requiring Lasix 80mg  BID prior to admission so would consider transitioning to Torsemide at discharge for improved bioavailability. I am concerned about her compliance with following a low-sodium diet and fluid restrictions as she was requesting salt this morning. Suspect she will continue to have recurrent hospitalizations given this.  - she does have oxygen-dependent COPD which is likely contributing to her Cor Pulmonale. Would benefit from an outpatient sleep study as well.   2. CAD - patient reports a history of CAD but no records available in regards to this. She did have a low-risk NST in 01/2018. EKG shows known RBBB.  - she has experienced occasional episodes of NSVT, up to 11 beats. K+ 4.2. Will check Mg.  - not on ASA given the need for anticoagulation. Continue BB. Statin held given elevated LFT's (improving with AST 22 and ALT 71).   3. Paroxysmal Atrial Fibrillation - this was a new diagnosis for the patient last admission. HR remains in the 90's to 110's while at rest. BP is soft so will cautiously titrate Lopressor from 25mg  BID to 37.5mg  BID for improved rate-control.  - remains on Coumadin for anticoagulation. INR 3.0 on 03/09/2020.  4.COPD - on 3L [place] at baseline. Treated with IV steroids and Azithromycin this admission. Further management per admitting team. She does continue to smoke and is not interested in quitting at this time.   5. Sepsis secondary to UTI - Lactic acid 2.7 on admission and WBC 17.8 which improved to  13.1. Remains on Ceftriaxone.   7. Stage 4 CKD - baseline creatinine 2.0 - 2.2. Peaked at 2.81 this admission, impoved to 2.73 this AM. Needs to establish with Nephrology as an outpatient.    For questions or updates, please contact [place] Please consult [email or web address] for contact info under Cardiology/STEMI.  Signed, Erma Heritage, PA-C 03/10/2020, 9:01 AM Pager: [phone]   Attending note:  Patient seen and examined.  I reviewed her records in detail and discussed the case with Ms. Delano Metz, I agree with her above findings.  Ms. Handyside presents now with fluid overload in the setting of acute on chronic diastolic heart failure as well as significant RV dysfunction and cor pulmonale.  Dietary noncompliance is an issue based on discussion.  She also has chronic hypoxic respiratory failure exacerbating the problem.  She has recently documented in persisting atrial fibrillation, heart rate control has not been optimal which is probably also contributing to heart failure decompensation.  This morning she is noted to be afebrile, heart rate is in the 100-115 range in atrial fibrillation by telemetry which I personally reviewed.  Systolic blood pressure 355-732 range.  Net urine output of approximately 1300 cc last 48 hours.  Lungs exhibit basilar crackles, cardiac exam  with irregularly irregular rhythm.  She has chronic appearing distal edema.  Lab work shows potassium 4.2, BUN 83, creatinine 2.73, AST 22, ALT 71, BNP 697.  Chest x-ray from March 9 reported cardiomegaly with mild vascular congestion and increased interstitial markings.  Recent ECG reviewed and shows atrial fibrillation with right bundle branch block.  Recommend up titrating beta-blocker for better heart rate control of atrial fibrillation, hopefully this will help to further stabilize diastolic heart failure as well.  Continue IV Lasix, dose being changed to 40 mg twice daily.  Supplement oxygen as required.   She remains on Coumadin for stroke prophylaxis with recent INR 3.0.  Not a candidate for ARB/ACE inhibitor at this time.  Reinforced compliance with dietary restrictions, also outpatient follow-up.  Satira Sark, M.D., F.A.C.C.

## 2020-03-10 NOTE — Evaluation (Signed)
Physical Therapy Evaluation Patient Details Name: Tracy [name] MRN: [id number] DOB: [birth year] Today's Date: 03/10/2020   History of Present Illness  Tracy [name] is a 59 y.o. female with medical history significant of Right-sided heart failure, COPD, tobacco use, chronic respiratory failure on home oxygen, diabetes and hypertension, was recently discharged on 2/22 after being treated for decompensated CHF.  Patient has been under the care of her daughter who reports that she has been compliant with her medications.  Patient has had worsening lower extremity edema and increased weight gain for over a week now.  She is not had any nausea, vomiting or abdominal pain.  Her p.o. intake has been poor since her last discharge.  She does become short of breath on exertion, but is not particularly worse than her baseline.  She was noted to be hypoglycemic and had altered mental status earlier today and was brought to the hospital for evaluation.  She has been afebrile.  Her daughter reports that her urine is quite dark.  She is noticed that she has been sleeping more lately and has been more somnolent.    Clinical Impression  Patient limited for functional mobility as stated below secondary to BLE weakness, fatigue and poor balance. Patient apprehensive to transition to seated but does lean forward while lying in bed with assist and HOB elevated. Patient able to move LE in gravity reduced positioning. Patient able to complete ankle and knee exercise while in bed following cueing. Patient is present with daughter. Educated patient and daughter about different PT follow up options. Patient appears generally weak and has impaired activity tolerance. Patient will benefit from continued physical therapy in hospital and recommended venue below to increase strength, balance, endurance for safe ADLs and gait.     Follow Up Recommendations SNF    Equipment Recommendations  None recommended by PT     Recommendations for Other Services       Precautions / Restrictions Precautions Precautions: Fall Precaution Comments: R femoral central line Restrictions Weight Bearing Restrictions: No      Mobility  Bed Mobility Overal bed mobility: Needs Assistance Bed Mobility: Rolling;Supine to Sit Rolling: Min assist   Supine to sit: HOB elevated;Mod assist     General bed mobility comments: patient apprehensive to transition to seated but does lean forward while lying in bed with assist and HOB elevated  Transfers                    Ambulation/Gait                Stairs            Wheelchair Mobility    Modified Rankin (Stroke Patients Only)       Balance                                             Pertinent Vitals/Pain Pain Assessment: No/denies pain    Home Living Family/patient expects to be discharged to:: Private residence Living Arrangements: Alone Available Help at Discharge: Family;Available PRN/intermittently Type of Home: House Home Access: Stairs to enter Entrance Stairs-Rails: Right;Left;Can reach both Entrance Stairs-Number of Steps: 4 Home Layout: One level Home Equipment: Cane - quad;Walker - 4 wheels;Shower seat      Prior Function Level of Independence: Independent with assistive device(s);Needs assistance   Gait / Transfers Assistance Needed: uses  quad cane, limited household ambulator  ADL's / Homemaking Assistance Needed: needs assist from daughter  Comments: household and short distanced community ambulator with quad-cane prior to last admission in Feb 2021, requires assist and has limited mobility at home since discharge     Hand Dominance   Dominant Hand: Left    Extremity/Trunk Assessment   Upper Extremity Assessment Upper Extremity Assessment: Generalized weakness    Lower Extremity Assessment Lower Extremity Assessment: Generalized weakness    Cervical / Trunk Assessment Cervical /  Trunk Assessment: Normal  Communication   Communication: No difficulties  Cognition Arousal/Alertness: Awake/alert Behavior During Therapy: WFL for tasks assessed/performed Overall Cognitive Status: Within Functional Limits for tasks assessed                                        General Comments      Exercises General Exercises - Lower Extremity Ankle Circles/Pumps: AROM;Supine;Both;5 reps Quad Sets: AROM;Both;5 reps;Supine Heel Slides: AROM;Both;5 reps;Supine   Assessment/Plan    PT Assessment Patient needs continued PT services  PT Problem List Decreased strength;Decreased activity tolerance;Decreased balance;Decreased mobility;Obesity       PT Treatment Interventions Balance training;Gait training;Stair training;Functional mobility training;Therapeutic activities;Therapeutic exercise;Patient/family education;DME instruction;Neuromuscular re-education    PT Goals (Current goals can be found in the Care Plan section)  Acute Rehab PT Goals Patient Stated Goal: Return home PT Goal Formulation: With patient Time For Goal Achievement: 03/24/20 Potential to Achieve Goals: Fair    Frequency Min 3X/week   Barriers to discharge        Co-evaluation               AM-PAC PT "6 Clicks" Mobility  Outcome Measure Help needed turning from your back to your side while in a flat bed without using bedrails?: A Little Help needed moving from lying on your back to sitting on the side of a flat bed without using bedrails?: A Little Help needed moving to and from a bed to a chair (including a wheelchair)?: A Lot Help needed standing up from a chair using your arms (e.g., wheelchair or bedside chair)?: A Lot Help needed to walk in hospital room?: A Lot Help needed climbing 3-5 steps with a railing? : A Lot 6 Click Score: 14    End of Session Equipment Utilized During Treatment: Oxygen Activity Tolerance: Patient limited by fatigue;Patient limited by  lethargy Patient left: with call bell/phone within reach;in bed;with family/visitor present;with bed alarm set Nurse Communication: Mobility status PT Visit Diagnosis: Unsteadiness on feet (R26.81);Other abnormalities of gait and mobility (R26.89);Muscle weakness (generalized) (M62.81)    Time: 4967-5916 PT Time Calculation (min) (ACUTE ONLY): 15 min   Charges:   PT Evaluation $PT Eval Moderate Complexity: 1 Mod          12:33 PM, 03/10/20 Mearl Latin PT, DPT Physical Therapist at [facility]

## 2020-03-10 NOTE — Progress Notes (Signed)
PROGRESS NOTE  Tracy [name] JQB:[id number] DOB: 10-04-1961 DOA: 03/07/2020 PCP: Rory Percy, MD   Brief History: 59 year old female with a history of chronic respiratory failure on 3L,diastolic CHF, COPD, tobacco abuse, diabetes mellitus type 2, hypertension, remote cocaine use presenting with 1 week history of worsening lower extremity edema, generalized weakness, and dyspnea on exertion. The patient has had decreased oral intake. She was noted to have some confusion with hypoglycemia at home. As a result, the patient was brought for further evaluation. Patient had recent admission to the hospital from 02/15/2020 to 02/21/2020 for acute on chronic diastolic CHF.  Serum creatinine 2.25 on 02/21/20.  Serum creatinine noted to be 2.63 on 02/28/20. She was discharged home on furosemide 40 mg twice daily. Discharge weight was 122.6 kg (270lb).  She followed up with cardiology in office on 02/28/20 and she was noted to be fluid overloaded.  Weight on 02/28/20 was 257.2.  Even though her weight was down from time of d/c she was felt to be clinically fluid overloaded and instructed to take lasix 80 mg bid x 4 days, then back to usual 40 mg bid. Upon presentation, the patient was noted to be fluid overloaded. Chest x-ray showed pulmonary vascular congestion. She was started on IV furosemide 60 mg bid.  Assessment/Plan: Acute on chronic diastolic CHF/cor pulmonale -Patient remains clinically fluid overloaded -endorses dietary and fluid indiscretions -Continue IV furosemide -3/11 standing weight = 262.0 lbs -Accurate I's and O's -body habitus makes fluid status assessment difficult -appreciate cardiology  COPD exacerbation -Continue Pulmicort -Continue duo nebs -continue IV Solu-Medrol>>>po prednisone -continue azithromycin  Sepsis -present on admission -secondary to UTI -wbc 17.8 with lactate 2.7 -continue ceftriaxone  -sepsis physiology resolved  [place]i UTI -UA>50  WBC -Continue ceftriaxone   Chronic respiratory failure with hypoxia -Patient is chronically on 3 L nasal cannula  CKD stage IV -Baseline creatinine 2.2-2.6 -will need to tolerate worsening renal function for improved respiratory status -appreciate nephrology consult  Thrombocytopenia -Has been chronic, likely secondary to hepatic congestion from the patient's cor pulmonale  Paroxysmal atrial fibrillation,with RVR -Restart metoprolol-->increased to 37.5 mg bid -d/c rivaroxaban due to worsen renal function -start apixaban once INR subtherapeutic  Diabetes mellitus type 2 -NovoLog sliding scale -Holding Jardiance and Amaryl -02/15/20 A1C--7.5  Hyponatremia -in part due to fluid overload -AM bmp  Tobacco abuse -Tobacco cessation discussed  Chronic leg pain -Restart home dose hydrocodone -Venous duplex--neg for DVT  Morbid Obesity -BMI 42.17 -lifestyle modification       Disposition Plan: Patient From: Home D/C Place: Home 1-2Days when more euvolemic Barriers: Not Clinically Stable--remains fluid overloaded  Family Communication:Daughter updatedon phone 3/11  Consultants:none  Code Status: FULL   DVT Prophylaxis:xarelto   Procedures: As Listed in Progress Note Above  Antibiotics: Ceftriaxone 3/9>> azithro 3/9>>>    Subjective: Pt is breathing better, but remains sob with exertion.  Denies f/c, cp, n/v/d, abd pain.  Objective: Vitals:   03/10/20 0438 03/10/20 0803 03/10/20 1343 03/10/20 1404  BP: 101/73   109/63  Pulse: (!) 107   100  Resp: 20   18  Temp: 97.8 F (36.6 C)   98.2 F (36.8 C)  TempSrc:    Oral  SpO2: 98% 94% 91% 94%  Weight:      Height:        Intake/Output Summary (Last 24 hours) at 03/10/2020 1644 Last data filed at 03/10/2020 1431 Gross per 24 hour  Intake 560  ml  Output 1700 ml  Net -1140 ml   Weight change: -0.1 kg Exam:   General:  Pt is alert, follows commands appropriately,  not in acute distress  HEENT: No icterus, No thrush, No neck mass, [place]/AT  Cardiovascular: iRRR, S1/S2, no rubs, no gallops  Respiratory: bibasilar crackles. No wheeze  Abdomen: Soft/+BS, non tender, non distended, no guarding  Extremities: 1+ LE edema, No lymphangitis, No petechiae, No rashes, no synovitis   Data Reviewed: I have personally reviewed following labs and imaging studies Basic Metabolic Panel: Recent Labs  Lab 03/07/20 1107 03/08/20 0548 03/09/20 0447 03/10/20 0450  NA 138 137 130* 133*  K 3.5 4.8 4.5 4.2  CL 100 100 95* 97*  CO2 24 20* 18* 19*  GLUCOSE 98 141* 203* 207*  BUN 67* 66* 71* 83*  CREATININE 2.63* 2.34* 2.81* 2.73*  CALCIUM 8.2* 8.7* 8.7* 8.6*   Liver Function Tests: Recent Labs  Lab 03/07/20 1107 03/08/20 0548 03/10/20 0450  AST 66* 44* 22  ALT 123* 95* 71*  ALKPHOS 315* 295* 286*  BILITOT 7.4* 6.2* 5.1*  PROT 5.7* 5.4* 5.4*  ALBUMIN 2.9* 2.5* 2.5*   No results for input(s): LIPASE, AMYLASE in the last 168 hours. No results for input(s): AMMONIA in the last 168 hours. Coagulation Profile: Recent Labs  Lab 03/07/20 1202 03/09/20 1040 03/10/20 1437  INR 3.5* 3.0* 2.9*   CBC: Recent Labs  Lab 03/07/20 1107 03/08/20 0548  WBC 17.8* 13.1*  NEUTROABS 16.1* 12.0*  HGB 12.6 12.3  HCT 43.0 42.3  MCV 94.9 94.0  PLT 82* 75*   Cardiac Enzymes: No results for input(s): CKTOTAL, CKMB, CKMBINDEX, TROPONINI in the last 168 hours. BNP: Invalid input(s): POCBNP CBG: Recent Labs  Lab 03/09/20 1704 03/09/20 2128 03/10/20 0750 03/10/20 1144 03/10/20 1630  GLUCAP 174* 223* 194* 222* 237*   HbA1C: No results for input(s): HGBA1C in the last 72 hours. Urine analysis:    Component Value Date/Time   COLORURINE AMBER (A) 03/07/2020 1107   APPEARANCEUR CLOUDY (A) 03/07/2020 1107   LABSPEC 1.013 03/07/2020 1107   PHURINE 5.0 03/07/2020 1107   GLUCOSEU 50 (A) 03/07/2020 1107   HGBUR LARGE (A) 03/07/2020 1107   BILIRUBINUR NEGATIVE  03/07/2020 1107   KETONESUR NEGATIVE 03/07/2020 1107   PROTEINUR 30 (A) 03/07/2020 1107   UROBILINOGEN 0.2 08/03/2013 2205   NITRITE NEGATIVE 03/07/2020 1107   LEUKOCYTESUR LARGE (A) 03/07/2020 1107   Sepsis Labs: @LABRCNTIP (procalcitonin:4,lacticidven:4) ) Recent Results (from the past 240 hour(s))  Urine Culture     Status: Abnormal   Collection Time: 03/07/20 11:07 AM   Specimen: Urine, Random  Result Value Ref Range Status   Specimen Description   Final    URINE, RANDOM Performed at [facility], [address] [place], [place] [address]    Special Requests   Final    NONE Performed at [facility], [address] [place], [place] [address]    Culture >=100,000 COLONIES/mL ESCHERICHIA COLI (A)  Final   Report Status 03/09/2020 FINAL  Final   Organism ID, Bacteria ESCHERICHIA COLI (A)  Final      Susceptibility   Escherichia coli - MIC*    AMPICILLIN >=32 RESISTANT Resistant     CEFAZOLIN <=4 SENSITIVE Sensitive     CEFTRIAXONE <=0.25 SENSITIVE Sensitive     CIPROFLOXACIN <=0.25 SENSITIVE Sensitive     GENTAMICIN <=1 SENSITIVE Sensitive     IMIPENEM <=0.25 SENSITIVE Sensitive     NITROFURANTOIN <=16 SENSITIVE Sensitive  TRIMETH/SULFA <=20 SENSITIVE Sensitive     AMPICILLIN/SULBACTAM >=32 RESISTANT Resistant     PIP/TAZO <=4 SENSITIVE Sensitive     * >=100,000 COLONIES/mL ESCHERICHIA COLI  Respiratory Panel by RT PCR (Flu A&B, Covid) - Nasopharyngeal Swab     Status: None   Collection Time: 03/07/20 11:53 AM   Specimen: Nasopharyngeal Swab  Result Value Ref Range Status   SARS Coronavirus 2 by RT PCR NEGATIVE NEGATIVE Final    Comment: (NOTE) SARS-CoV-2 target nucleic acids are NOT DETECTED. The SARS-CoV-2 RNA is generally detectable in upper respiratoy specimens during the acute phase of infection. The lowest concentration of SARS-CoV-2 viral copies this assay can detect is 131 copies/mL. A negative result does not preclude SARS-Cov-2 infection and should not  be used as the sole basis for treatment or other patient management decisions. A negative result may occur with  improper specimen collection/handling, submission of specimen other than nasopharyngeal swab, presence of viral mutation(s) within the areas targeted by this assay, and inadequate number of viral copies (<131 copies/mL). A negative result must be combined with clinical observations, patient history, and epidemiological information. The expected result is Negative. Fact Sheet for Patients:  PinkCheek.be Fact Sheet for Healthcare Providers:  GravelBags.it This test is not yet ap proved or cleared by the [place] FDA and  has been authorized for detection and/or diagnosis of SARS-CoV-2 by FDA under an Emergency Use Authorization (EUA). This EUA will remain  in effect (meaning this test can be used) for the duration of the COVID-19 declaration under Section 564(b)(1) of the Act, 21 U.S.C. section 360bbb-3(b)(1), unless the authorization is terminated or revoked sooner.    Influenza A by PCR NEGATIVE NEGATIVE Final   Influenza B by PCR NEGATIVE NEGATIVE Final    Comment: (NOTE) The Xpert Xpress SARS-CoV-2/FLU/RSV assay is intended as an aid in  the diagnosis of influenza from Nasopharyngeal swab specimens and  should not be used as a sole basis for treatment. Nasal washings and  aspirates are unacceptable for Xpert Xpress SARS-CoV-2/FLU/RSV  testing. Fact Sheet for Patients: PinkCheek.be Fact Sheet for Healthcare Providers: GravelBags.it This test is not yet approved or cleared by the [place] FDA and  has been authorized for detection and/or diagnosis of SARS-CoV-2 by  FDA under an Emergency Use Authorization (EUA). This EUA will remain  in effect (meaning this test can be used) for the duration of the  Covid-19 declaration under Section 564(b)(1) of  the Act, 21  U.S.C. section 360bbb-3(b)(1), unless the authorization is  terminated or revoked. Performed at [facility], [address] [place], [place] [address]      Scheduled Meds: . azithromycin  250 mg Oral Daily  . budesonide (PULMICORT) nebulizer solution  0.25 mg Nebulization BID  . Chlorhexidine Gluconate Cloth  6 each Topical Daily  . Chlorhexidine Gluconate Cloth  6 each Topical Q0600  . furosemide  40 mg Intravenous BID  . insulin aspart  0-9 Units Subcutaneous TID WC  . ipratropium-albuterol  3 mL Nebulization TID  . metoprolol tartrate  37.5 mg Oral BID  . nicotine  21 mg Transdermal Daily  . predniSONE  50 mg Oral Q breakfast  . sodium chloride flush  10-40 mL Intracatheter Q12H  . sodium chloride flush  3 mL Intravenous Q12H   Continuous Infusions: . sodium chloride    . cefTRIAXone (ROCEPHIN)  IV 1 g (03/09/20 1735)    Procedures/Studies: US Venous Img Lower Bilateral (DVT)  Result Date: 03/08/2020 CLINICAL DATA:  Chronic bilateral lower extremity edema, worsened during the past week. History of previous right lower extremity DVT (femoral and popliteal veins). History of smoking. Evaluate for DVT. EXAM: BILATERAL LOWER EXTREMITY VENOUS DOPPLER ULTRASOUND TECHNIQUE: Gray-scale sonography with graded compression, as well as color Doppler and duplex ultrasound were performed to evaluate the lower extremity deep venous systems from the level of the common femoral vein and including the common femoral, femoral, profunda femoral, popliteal and calf veins including the posterior tibial, peroneal and gastrocnemius veins when visible. The superficial great saphenous vein was also interrogated. Spectral Doppler was utilized to evaluate flow at rest and with distal augmentation maneuvers in the common femoral, femoral and popliteal veins. COMPARISON:  Bilateral lower extremity venous Doppler ultrasound-09/22/2017 (negative). FINDINGS: Examination is degraded due to patient body  habitus and poor sonographic window RIGHT LOWER EXTREMITY Common Femoral Vein: No evidence of thrombus. Normal compressibility, respiratory phasicity and response to augmentation. Saphenofemoral Junction: No evidence of thrombus. Normal compressibility and flow on color Doppler imaging. Profunda Femoral Vein: No evidence of thrombus. Normal compressibility and flow on color Doppler imaging. Femoral Vein: Appears patent where imaged. Popliteal Vein: No evidence of acute or chronic thrombus. Normal compressibility, respiratory phasicity and response to augmentation. Calf Veins: Appear patent where imaged. Superficial Great Saphenous Vein: No evidence of thrombus. Normal compressibility. Venous Reflux:  None. Other Findings:  None. LEFT LOWER EXTREMITY Common Femoral Vein: No evidence of thrombus. Normal compressibility, respiratory phasicity and response to augmentation. Saphenofemoral Junction: No evidence of thrombus. Normal compressibility and flow on color Doppler imaging. Profunda Femoral Vein: No evidence of thrombus. Normal compressibility and flow on color Doppler imaging. Femoral Vein: Appears patent where imaged. Popliteal Vein: No evidence of thrombus. Normal compressibility, respiratory phasicity and response to augmentation. Calf Veins: Appear patent where imaged. Superficial Great Saphenous Vein: No evidence of thrombus. Normal compressibility. Venous Reflux:  None. Other Findings:  None. IMPRESSION: No evidence of acute or chronic DVT within either lower extremity on this body habitus degraded examination. Electronically Signed   By: Sandi Mariscal M.D.   On: 03/08/2020 16:21   DG Chest Port 1 View  Result Date: 03/07/2020 CLINICAL DATA:  Shortness of breath EXAM: PORTABLE CHEST 1 VIEW COMPARISON:  02/17/2020 FINDINGS: Stable cardiomegaly. Calcific aortic knob. Mild vascular congestion and subtly increased interstitial markings bilaterally. Linear atelectasis within the peripheral aspect of the left mid  lung. The visualized skeletal structures are unremarkable. IMPRESSION: Cardiomegaly with mild pulmonary vascular congestion and subtly increased interstitial markings suggesting mild edema. Electronically Signed   By: Davina Poke D.O.   On: 03/07/2020 12:29   DG Chest Port 1 View  Result Date: 02/17/2020 CLINICAL DATA:  Shortness of breath EXAM: PORTABLE CHEST 1 VIEW COMPARISON:  February 14, 2020 FINDINGS: There is scarring in the right base region. There is mild atelectasis in the left base. There is stable cardiomegaly with pulmonary venous hypertension. No adenopathy. There is aortic atherosclerosis. No bone lesions. IMPRESSION: Cardiomegaly with pulmonary vascular congestion. Mild scarring right base. Mild atelectasis left base. No appreciable pulmonary edema. Aortic Atherosclerosis (ICD10-I70.0). Electronically Signed   By: Lowella Grip III M.D.   On: 02/17/2020 07:46   DG Chest Port 1 View  Result Date: 02/14/2020 CLINICAL DATA:  Shortness of breath and fluid retention. EXAM: PORTABLE CHEST 1 VIEW COMPARISON:  PA and lateral chest 02/08/2020 and 05/20/2019. FINDINGS: There is marked cardiomegaly and pulmonary vascular congestion. No consolidative process, pneumothorax or effusion. Atherosclerosis is noted. No acute or focal bony abnormality.  IMPRESSION: Cardiomegaly and vascular congestion. Atherosclerosis. Electronically Signed   By: Inge Rise M.D.   On: 02/14/2020 17:45   ECHOCARDIOGRAM COMPLETE  Result Date: 02/16/2020    ECHOCARDIOGRAM REPORT   Patient Name:   DAJANE VALLI Date of Exam: 02/16/2020 Medical Rec #:  [id number]             Height:       66.0 in Accession #:    [id number]            Weight:       266.1 lb Date of Birth:  1961/10/29            BSA:          2.26 m Patient Age:    48 years              BP:           126/89 mmHg Patient Gender: F                     HR:           61 bpm. Exam Location:  [facility] Procedure: 2D Echo Indications:    Dyspnea  786.09 / R06.00  History:        Patient has prior history of Echocardiogram examinations, most                 recent 09/22/2017. CHF, Stroke and COPD; Risk Factors:Current                 Smoker, Diabetes and Hypertension. Crack cocaine use, RBBB,                 Acute renal failure , LVH , Acute respiratory failure with                 hypoxia.  Sonographer:    Leavy Cella RDCS (AE) Referring Phys: [id number] COURAGE EMOKPAE IMPRESSIONS  1. Left ventricular ejection fraction, by estimation, is 55 to 60%. The left ventricle has normal function. The left ventricle has no regional wall motion abnormalities. There is mild concentric left ventricular hypertrophy. Left ventricular diastolic parameters are indeterminate. Elevated left ventricular end-diastolic pressure.  2. Right ventricular systolic function is severely reduced. The right ventricular size is moderately enlarged. mildly increased right ventricular wall thickness. There is moderately elevated pulmonary artery systolic pressure.  3. Left atrial size was severely dilated.  4. Right atrial size was severely dilated.  5. The mitral valve is grossly normal. Mild mitral valve regurgitation.  6. Tricuspid valve regurgitation is moderate.  7. The aortic valve is tricuspid. Aortic valve regurgitation is not visualized. No aortic stenosis is present.  8. The inferior vena cava is dilated in size with >50% respiratory variability, suggesting right atrial pressure of 8 mmHg. FINDINGS  Left Ventricle: Left ventricular ejection fraction, by estimation, is 55 to 60%. The left ventricle has normal function. The left ventricle has no regional wall motion abnormalities. The left ventricular internal cavity size was normal in size. There is  mild concentric left ventricular hypertrophy. Left ventricular diastolic parameters are indeterminate. Elevated left ventricular end-diastolic pressure. Right Ventricle: The right ventricular size is moderately enlarged. Mildly  increased right ventricular wall thickness. Right ventricular systolic function is severely reduced. There is moderately elevated pulmonary artery systolic pressure. The tricuspid  regurgitant velocity is 3.12 m/s, and with an assumed right atrial pressure of 10 mmHg, the estimated right ventricular systolic pressure is 18.5 mmHg.  Left Atrium: Left atrial size was severely dilated. Right Atrium: Right atrial size was severely dilated. Pericardium: There is no evidence of pericardial effusion. Mitral Valve: The mitral valve is grossly normal. Mild mitral annular calcification. Mild mitral valve regurgitation. Tricuspid Valve: The tricuspid valve is grossly normal. Tricuspid valve regurgitation is moderate. Aortic Valve: The aortic valve is tricuspid. Aortic valve regurgitation is not visualized. No aortic stenosis is present. Pulmonic Valve: The pulmonic valve was grossly normal. Pulmonic valve regurgitation is not visualized. Aorta: The aortic root is normal in size and structure. Venous: The inferior vena cava is dilated in size with greater than 50% respiratory variability, suggesting right atrial pressure of 8 mmHg. IAS/Shunts: No atrial level shunt detected by color flow Doppler.  LEFT VENTRICLE PLAX 2D LVIDd:         4.54 cm  Diastology LVIDs:         2.77 cm  LV e' lateral:   11.20 cm/s LV PW:         1.40 cm  LV E/e' lateral: 7.8 LV IVS:        1.16 cm  LV e' medial:    4.24 cm/s LVOT diam:     2.10 cm  LV E/e' medial:  20.7 LV SV Index:   26.96 LVOT Area:     3.46 cm  RIGHT VENTRICLE RV S prime:     8.05 cm/s LEFT ATRIUM              Index       RIGHT ATRIUM           Index LA diam:        4.40 cm  1.95 cm/m  RA Area:     41.10 cm LA Vol (A2C):   143.0 ml 63.34 ml/m RA Volume:   176.00 ml 77.95 ml/m LA Vol (A4C):   99.1 ml  43.89 ml/m LA Biplane Vol: 121.0 ml 53.59 ml/m   AORTA Ao Root diam: 3.40 cm MITRAL VALVE               TRICUSPID VALVE MV Area (PHT): 4.06 cm    TR Peak grad:   38.9 mmHg MV  Decel Time: 187 msec    TR Vmax:        312.00 cm/s MR Peak grad: 62.1 mmHg MR Vmax:      394.00 cm/s  SHUNTS MV E velocity: 87.80 cm/s  Systemic Diam: 2.10 cm MV A velocity: 47.50 cm/s MV E/A ratio:  1.85 Kate Sable MD Electronically signed by Kate Sable MD Signature Date/Time: 02/16/2020/4:32:20 PM    Final    US Abdomen Limited RUQ  Result Date: 03/07/2020 CLINICAL DATA:  Right upper quadrant pain and elevated LFTs EXAM: ULTRASOUND ABDOMEN LIMITED RIGHT UPPER QUADRANT COMPARISON:  02/16/2020 FINDINGS: Gallbladder: No gallstones or wall thickening visualized. No sonographic Murphy sign noted by sonographer. Common bile duct: Diameter: 4 mm Liver: No focal lesion identified. Within normal limits in parenchymal echogenicity. Portal vein is patent on color Doppler imaging with normal direction of blood flow towards the liver. Other: None. IMPRESSION: No acute abnormality noted. Electronically Signed   By: Inez Catalina M.D.   On: 03/07/2020 15:05   US Abdomen Limited RUQ  Result Date: 02/16/2020 CLINICAL DATA:  Bilirubinemia EXAM: ULTRASOUND ABDOMEN LIMITED RIGHT UPPER QUADRANT COMPARISON:  None. FINDINGS: Gallbladder: No gallstones or wall thickening visualized. No sonographic Murphy sign noted by sonographer. Common bile duct: Diameter: 3 mm Liver: No focal lesion identified. Within normal  limits in parenchymal echogenicity. Portal vein is patent on color Doppler imaging with normal direction of blood flow towards the liver. IMPRESSION: Negative right upper quadrant ultrasound. Electronically Signed   By: Monte Fantasia M.D.   On: 02/16/2020 10:40    Orson Eva, DO  [facility]  If 7PM-7AM, please contact night-coverage [email or web address] Password [facility] 03/10/2020, 4:44 PM   LOS: 3 days

## 2020-03-10 NOTE — Progress Notes (Signed)
Patient continues to refuse to wear the CPAP/BiPAP machine.

## 2020-03-10 NOTE — Progress Notes (Signed)
ANTICOAGULATION CONSULT NOTE - Initial Consult  Pharmacy Consult for apixaban Indication: atrial fibrillation  No Known Allergies  Patient Measurements: Height: 5\' 6"  (167.6 cm) Weight: 265 lb 14 oz (120.6 kg) IBW/kg (Calculated) : 59.3  Vital Signs: Temp: 98.2 F (36.8 C) (03/12 1404) Temp Source: Oral (03/12 1404) BP: 109/63 (03/12 1404) Pulse Rate: 100 (03/12 1404)  Labs: Recent Labs    03/08/20 0548 03/09/20 0447 03/09/20 1040 03/10/20 0450 03/10/20 1437  HGB 12.3  --   --   --   --   HCT 42.3  --   --   --   --   PLT 75*  --   --   --   --   LABPROT  --   --  31.1*  --  30.4*  INR  --   --  3.0*  --  2.9*  CREATININE 2.34* 2.81*  --  2.73*  --     Estimated Creatinine Clearance: 29.4 mL/min (A) (by C-G formula based on SCr of 2.73 mg/dL (H)).    Assessment: Pharmacy consulted to dose apixaban in patient with atrial fibrillation.  She was on warfarin prior to admission but the plan was to transition to Xarelto once INR trended down- patinet's renal function has worsened and new plan is to transition to apixaban.     Goal of Therapy:  Monitor platelets by anticoagulation protocol: Yes   Plan:  INR 2.9-->continue to hold apixaban Start apixaban 5 mg BID once INR is < 2. Monitor H&H and s/s of bleeding.  Despina Pole 03/10/2020,3:54 PM

## 2020-03-10 NOTE — Plan of Care (Signed)
  Problem: Acute Rehab PT Goals(only PT should resolve) Goal: Pt Will Go Supine/Side To Sit Outcome: Progressing Flowsheets (Taken 03/10/2020 1235) Pt will go Supine/Side to Sit: with supervision Goal: Pt Will Go Sit To Supine/Side Outcome: Progressing Flowsheets (Taken 03/10/2020 1235) Pt will go Sit to Supine/Side: with supervision Goal: Patient Will Perform Sitting Balance Outcome: Progressing Flowsheets (Taken 03/10/2020 1235) Patient will perform sitting balance: with supervision Goal: Patient Will Transfer Sit To/From Stand Outcome: Progressing Flowsheets (Taken 03/10/2020 1235) Patient will transfer sit to/from stand: with supervision Goal: Pt Will Transfer Bed To Chair/Chair To Bed Outcome: Progressing Flowsheets (Taken 03/10/2020 1235) Pt will Transfer Bed to Chair/Chair to Bed: with supervision Goal: Pt Will Ambulate Outcome: Progressing Flowsheets (Taken 03/10/2020 1235) Pt will Ambulate:  50 feet  with rolling walker  with supervision  12:36 PM, 03/10/20 Mearl Latin PT, DPT Physical Therapist at [facility]

## 2020-03-10 NOTE — Consult Note (Signed)
[facility]  HISTORY AND PHYSICAL  Tracy [name] is an 59 y.o. female.    Chief Complaint: increased weight, shortness of breath  HPI:  Pt is a 61F with a PMH sig for HTN, HLD, DM, Afib, h/o R-sided heart failure, LVH with EF 55-60% COPD on 3L O2, and nonadherence who is now seen in consultation at the request of Dr. Carles Collet for eval and recs re: AKI on CKD.    It appears that pt has had multiple admissions both here at [facility] and [facility] for acute on chronic episodes of CHF.  Last time here was in mid-Feb.  Left AMA during hospitalization and then returned for rx.  She was diuresed with IV lasix and then was discharged.  Notes that she didn't follow her Na or fluid restriction.  Went to hospital followup 3/1 and was noted to still be fluid overloaded.  Was instructed to increase Lasix to 80 mg BID x 4 days and then back to home dose of Lasix 40 mg BID.  Presented to ED for increased swelling all over and SOB.    Has been given Lasix 60 IV BID and has diuresed some although weights remain stable.    Appears that pt's creatinine has been fluctuating significantly with volume status but overall the trend has been upward.  Cr 2.02 02/14/20--> 2.6 on admission 03/07/20--> 2.34 3/10 --> 2.81 3/11--> 2.7 3/12.  In this setting we are asked to see.  On my eval pt sitting up watching TV in the room, O2 not on.  She denies any complaints except some mild tenderness LLE.  No f/c, n/v, CP,  Some SOB.     PMH: Past Medical History:  Diagnosis Date  . Acute ischemic stroke ([place]) 07/03/2012  . Crack cocaine use   . Diabetes ([place])   . Diabetes mellitus ([place])   . DVT (deep venous thrombosis) ([place])   . Hemiparesis, acute ([place]) 07/04/2012  . Hypertension   . LVH (left ventricular hypertrophy) 07/04/2012   Ejection fraction 60%.  . Myocardial infarct, old   . Thrombocytopenia ([place]) 07/03/2012  . Tobacco abuse    PSH: Past Surgical History:  Procedure Laterality Date  . ABDOMINAL  HYSTERECTOMY    . HERNIA REPAIR      Past Medical History:  Diagnosis Date  . Acute ischemic stroke ([place]) 07/03/2012  . Crack cocaine use   . Diabetes ([place])   . Diabetes mellitus ([place])   . DVT (deep venous thrombosis) ([place])   . Hemiparesis, acute ([place]) 07/04/2012  . Hypertension   . LVH (left ventricular hypertrophy) 07/04/2012   Ejection fraction 60%.  . Myocardial infarct, old   . Thrombocytopenia ([place]) 07/03/2012  . Tobacco abuse     Medications:   Scheduled: . azithromycin  250 mg Oral Daily  . budesonide (PULMICORT) nebulizer solution  0.25 mg Nebulization BID  . Chlorhexidine Gluconate Cloth  6 each Topical Daily  . Chlorhexidine Gluconate Cloth  6 each Topical Q0600  . furosemide  40 mg Oral BID  . insulin aspart  0-9 Units Subcutaneous TID WC  . ipratropium-albuterol  3 mL Nebulization TID  . metoprolol tartrate  37.5 mg Oral BID  . nicotine  21 mg Transdermal Daily  . predniSONE  50 mg Oral Q breakfast  . sodium chloride flush  10-40 mL Intracatheter Q12H  . sodium chloride flush  3 mL Intravenous Q12H    Medications Prior to Admission  Medication Sig Dispense Refill  . acetaminophen (  TYLENOL) 325 MG tablet Take 2 tablets (650 mg total) by mouth every 6 (six) hours as needed for mild pain (or Fever >/= 101). 12 tablet 0  . albuterol (PROVENTIL) (2.5 MG/3ML) 0.083% nebulizer solution Take 3 mLs (2.5 mg total) by nebulization every 4 (four) hours as needed for wheezing or shortness of breath. 75 mL 12  . albuterol (VENTOLIN HFA) 108 (90 Base) MCG/ACT inhaler Inhale 2 puffs into the lungs every 4 (four) hours as needed for wheezing or shortness of breath. 18 g 2  . atorvastatin (LIPITOR) 10 MG tablet Take 1 tablet (10 mg total) by mouth every evening. 30 tablet 2  . budesonide-formoterol (SYMBICORT) 160-4.5 MCG/ACT inhaler Inhale 2 puffs into the lungs 2 (two) times daily. 1 Inhaler 12  . furosemide (LASIX) 40 MG tablet Take 1 tablet (40 mg total) by mouth 2 (two) times  daily. (Patient taking differently: Take 80 mg by mouth 2 (two) times daily. Increased to 80 mg x 4 days due to fluid in legs) 60 tablet 2  . gabapentin (NEURONTIN) 100 MG capsule Take 3 capsules (300 mg total) by mouth 2 (two) times daily. 60 capsule 2  . glimepiride (AMARYL) 2 MG tablet Take 1 tablet (2 mg total) by mouth daily with breakfast. 30 tablet 2  . HYDROcodone-acetaminophen (NORCO) 7.5-325 MG tablet Take 1 tablet by mouth every 6 (six) hours as needed for moderate pain (Must last 30 days). 60 tablet 0  . JARDIANCE 25 MG TABS tablet Take 25 mg by mouth daily. 30 tablet 3  . metoprolol tartrate (LOPRESSOR) 25 MG tablet Take 1 tablet (25 mg total) by mouth 2 (two) times daily. 60 tablet 2  . potassium chloride SA (KLOR-CON M20) 20 MEQ tablet Take 20 meq daily for 4 days and then STOP 10 tablet 0  . warfarin (COUMADIN) 5 MG tablet Take 5 mg by mouth every morning. as directed  0  . guaiFENesin (MUCINEX) 600 MG 12 hr tablet Take 1 tablet (600 mg total) by mouth 2 (two) times daily. (Patient not taking: Reported on 03/07/2020) 20 tablet 1  . nicotine (NICODERM CQ - DOSED IN MG/24 HOURS) 21 mg/24hr patch Place 1 patch (21 mg total) onto the skin daily. (Patient not taking: Reported on 03/07/2020) 28 patch 0  . predniSONE (DELTASONE) 20 MG tablet Take 2 tablets (40 mg total) by mouth daily with breakfast. (Patient not taking: Reported on 03/07/2020) 10 tablet 0  . traZODone (DESYREL) 100 MG tablet Take 1 tablet (100 mg total) by mouth at bedtime. (Patient not taking: Reported on 03/07/2020) 30 tablet 2  . XARELTO 20 MG TABS tablet Take 20 mg by mouth daily.      ALLERGIES:  No Known Allergies  FAM HX: Family History  Problem Relation Age of Onset  . Heart disease Mother   . Diabetes Mother   . Stroke Mother   . Hypertension Mother   . Hypertension Father   . Heart disease Father   . Diabetes Brother     Social History:   reports that she has been smoking cigarettes. She has been smoking  about 1.00 pack per day. She has never used smokeless tobacco. She reports that she does not drink alcohol or use drugs.  ROS: ROS: all other systems reviewed except as per HPI  Blood pressure 101/73, pulse (!) 107, temperature 97.8 F (36.6 C), resp. rate 20, height 5\' 6"  (1.676 m), weight 120.6 kg, SpO2 94 %. PHYSICAL EXAM: Physical Exam  GEN: NAD,  sitting in bed  HEENT EOMI PERRL NECK + some JVD upright PULM clear anteriorly, bibasilar crackles CV irregular ABD obese, nondistended but does look like she has some abd fullness, no frank fluid wave EXT 3+ anasarca NEURO AAO x 3 SKIN skin protectors in place LLE MSK no effusions    Results for orders placed or performed during the hospital encounter of 03/07/20 (from the past 48 hour(s))  Glucose, capillary     Status: Abnormal   Collection Time: 03/08/20 12:03 PM  Result Value Ref Range   Glucose-Capillary 165 (H) 70 - 99 mg/dL    Comment: Glucose reference range applies only to samples taken after fasting for at least 8 hours.  Glucose, capillary     Status: Abnormal   Collection Time: 03/08/20  4:27 PM  Result Value Ref Range   Glucose-Capillary 157 (H) 70 - 99 mg/dL    Comment: Glucose reference range applies only to samples taken after fasting for at least 8 hours.  Glucose, capillary     Status: Abnormal   Collection Time: 03/08/20  9:11 PM  Result Value Ref Range   Glucose-Capillary 228 (H) 70 - 99 mg/dL    Comment: Glucose reference range applies only to samples taken after fasting for at least 8 hours.  Basic metabolic panel     Status: Abnormal   Collection Time: 03/09/20  4:47 AM  Result Value Ref Range   Sodium 130 (L) 135 - 145 mmol/L    Comment: DELTA CHECK NOTED   Potassium 4.5 3.5 - 5.1 mmol/L   Chloride 95 (L) 98 - 111 mmol/L   CO2 18 (L) 22 - 32 mmol/L   Glucose, Bld 203 (H) 70 - 99 mg/dL    Comment: Glucose reference range applies only to samples taken after fasting for at least 8 hours.   BUN 71 (H) 6  - 20 mg/dL   Creatinine, Ser 2.81 (H) 0.44 - 1.00 mg/dL   Calcium 8.7 (L) 8.9 - 10.3 mg/dL   GFR calc non Af Amer 18 (L) >60 mL/min   GFR calc Af Amer 20 (L) >60 mL/min   Anion gap 17 (H) 5 - 15    Comment: Performed at [facility], [address] [place], [place] [address]  Glucose, capillary     Status: Abnormal   Collection Time: 03/09/20  7:55 AM  Result Value Ref Range   Glucose-Capillary 210 (H) 70 - 99 mg/dL    Comment: Glucose reference range applies only to samples taken after fasting for at least 8 hours.  Protime-INR     Status: Abnormal   Collection Time: 03/09/20 10:40 AM  Result Value Ref Range   Prothrombin Time 31.1 (H) 11.4 - 15.2 seconds   INR 3.0 (H) 0.8 - 1.2    Comment: (NOTE) INR goal varies based on device and disease states. Performed at [facility], [address], [place], [place] [address]   Glucose, capillary     Status: Abnormal   Collection Time: 03/09/20 11:30 AM  Result Value Ref Range   Glucose-Capillary 196 (H) 70 - 99 mg/dL    Comment: Glucose reference range applies only to samples taken after fasting for at least 8 hours.  Glucose, capillary     Status: Abnormal   Collection Time: 03/09/20  5:04 PM  Result Value Ref Range   Glucose-Capillary 174 (H) 70 - 99 mg/dL    Comment: Glucose reference range applies only to samples taken after fasting for at least 8  hours.  Glucose, capillary     Status: Abnormal   Collection Time: 03/09/20  9:28 PM  Result Value Ref Range   Glucose-Capillary 223 (H) 70 - 99 mg/dL    Comment: Glucose reference range applies only to samples taken after fasting for at least 8 hours.  Basic metabolic panel     Status: Abnormal   Collection Time: 03/10/20  4:50 AM  Result Value Ref Range   Sodium 133 (L) 135 - 145 mmol/L   Potassium 4.2 3.5 - 5.1 mmol/L   Chloride 97 (L) 98 - 111 mmol/L   CO2 19 (L) 22 - 32 mmol/L   Glucose, Bld 207 (H) 70 - 99 mg/dL    Comment: Glucose reference range applies only to samples taken  after fasting for at least 8 hours.   BUN 83 (H) 6 - 20 mg/dL   Creatinine, Ser 2.73 (H) 0.44 - 1.00 mg/dL   Calcium 8.6 (L) 8.9 - 10.3 mg/dL   GFR calc non Af Amer 18 (L) >60 mL/min   GFR calc Af Amer 21 (L) >60 mL/min   Anion gap 17 (H) 5 - 15    Comment: Performed at [facility], [address], [place], [place] [address]  Hepatic function panel     Status: Abnormal   Collection Time: 03/10/20  4:50 AM  Result Value Ref Range   Total Protein 5.4 (L) 6.5 - 8.1 g/dL   Albumin 2.5 (L) 3.5 - 5.0 g/dL   AST 22 15 - 41 U/L   ALT 71 (H) 0 - 44 U/L   Alkaline Phosphatase 286 (H) 38 - 126 U/L   Total Bilirubin 5.1 (H) 0.3 - 1.2 mg/dL   Bilirubin, Direct 2.8 (H) 0.0 - 0.2 mg/dL   Indirect Bilirubin 2.3 (H) 0.3 - 0.9 mg/dL    Comment: Performed at [facility], [address], [place], [place] [address]  Brain natriuretic peptide     Status: Abnormal   Collection Time: 03/10/20  4:50 AM  Result Value Ref Range   B Natriuretic Peptide 697.0 (H) 0.0 - 100.0 pg/mL    Comment: Performed at [facility], [address], [place], [place] [address]  Glucose, capillary     Status: Abnormal   Collection Time: 03/10/20  7:50 AM  Result Value Ref Range   Glucose-Capillary 194 (H) 70 - 99 mg/dL    Comment: Glucose reference range applies only to samples taken after fasting for at least 8 hours.    US Venous Img Lower Bilateral (DVT)  Result Date: 03/08/2020 CLINICAL DATA:  Chronic bilateral lower extremity edema, worsened during the past week. History of previous right lower extremity DVT (femoral and popliteal veins). History of smoking. Evaluate for DVT. EXAM: BILATERAL LOWER EXTREMITY VENOUS DOPPLER ULTRASOUND TECHNIQUE: Gray-scale sonography with graded compression, as well as color Doppler and duplex ultrasound were performed to evaluate the lower extremity deep venous systems from the level of the common femoral vein and including the common femoral, femoral, profunda femoral, popliteal and calf  veins including the posterior tibial, peroneal and gastrocnemius veins when visible. The superficial great saphenous vein was also interrogated. Spectral Doppler was utilized to evaluate flow at rest and with distal augmentation maneuvers in the common femoral, femoral and popliteal veins. COMPARISON:  Bilateral lower extremity venous Doppler ultrasound-09/22/2017 (negative). FINDINGS: Examination is degraded due to patient body habitus and poor sonographic window RIGHT LOWER EXTREMITY Common Femoral Vein: No evidence of thrombus. Normal compressibility, respiratory phasicity and response to augmentation.  Saphenofemoral Junction: No evidence of thrombus. Normal compressibility and flow on color Doppler imaging. Profunda Femoral Vein: No evidence of thrombus. Normal compressibility and flow on color Doppler imaging. Femoral Vein: Appears patent where imaged. Popliteal Vein: No evidence of acute or chronic thrombus. Normal compressibility, respiratory phasicity and response to augmentation. Calf Veins: Appear patent where imaged. Superficial Great Saphenous Vein: No evidence of thrombus. Normal compressibility. Venous Reflux:  None. Other Findings:  None. LEFT LOWER EXTREMITY Common Femoral Vein: No evidence of thrombus. Normal compressibility, respiratory phasicity and response to augmentation. Saphenofemoral Junction: No evidence of thrombus. Normal compressibility and flow on color Doppler imaging. Profunda Femoral Vein: No evidence of thrombus. Normal compressibility and flow on color Doppler imaging. Femoral Vein: Appears patent where imaged. Popliteal Vein: No evidence of thrombus. Normal compressibility, respiratory phasicity and response to augmentation. Calf Veins: Appear patent where imaged. Superficial Great Saphenous Vein: No evidence of thrombus. Normal compressibility. Venous Reflux:  None. Other Findings:  None. IMPRESSION: No evidence of acute or chronic DVT within either lower extremity on this body  habitus degraded examination. Electronically Signed   By: Sandi Mariscal M.D.   On: 03/08/2020 16:21    Assessment/Plan  1.  AKI on CKD vs progression of CKV 3b IV--> UA reflective of UTI.  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  She unfortunately hasn't been able to adhere to salt and fluid restrictions.  I forsee her creatinine overall uptrending over time to a point where we may have to consider dialysis.  I think we should make at least an attempt to get her to wear NIPPV at night while here as this will potentially aid in diuresis.  I agree with IV Lasix and expect fluctuations in Cr d/t volume status.  We'll get her an appointment with our practice for when she discharges too. I"ll do a UP/C but unlikely she could get ACEi/ARB d/t fluctuating Cr.  2.  Acute on chronic CHF exacerbation with R sided heart failure: has cor pulmonale, cardiology consulted, appreciate assistance.    3.  Afib: on metop and warfarin  3.  E. Coli UTI: on ceftriaxone  4.  COPD: on 3L home O2, being treated with azithro and pred  5.  Possible OSA: will order CPAP at night   6.  Dispo: pending diuresis Tracy [name] 03/10/2020, 9:34 AM

## 2020-03-10 NOTE — Care Management Important Message (Signed)
Important Message  Patient Details  Name: Tracy [name] MRN: [id number] Date of Birth: October 21, 1961   Medicare Important Message Given:  Yes     [name] 03/10/2020, 1:54 PM

## 2020-03-11 LAB — BASIC METABOLIC PANEL
Anion gap: 17 — ABNORMAL HIGH (ref 5–15)
BUN: 86 mg/dL — ABNORMAL HIGH (ref 6–20)
CO2: 22 mmol/L (ref 22–32)
Calcium: 8.7 mg/dL — ABNORMAL LOW (ref 8.9–10.3)
Chloride: 94 mmol/L — ABNORMAL LOW (ref 98–111)
Creatinine, Ser: 2.53 mg/dL — ABNORMAL HIGH (ref 0.44–1.00)
GFR calc Af Amer: 23 mL/min — ABNORMAL LOW (ref >60)
GFR calc non Af Amer: 20 mL/min — ABNORMAL LOW (ref >60)
Glucose, Bld: 147 mg/dL — ABNORMAL HIGH (ref 70–99)
Potassium: 3.9 mmol/L (ref 3.5–5.1)
Sodium: 133 mmol/L — ABNORMAL LOW (ref 135–145)

## 2020-03-11 LAB — PROTIME-INR
INR: 3.1 — ABNORMAL HIGH (ref 0.8–1.2)
Prothrombin Time: 32.3 seconds — ABNORMAL HIGH (ref 11.4–15.2)

## 2020-03-11 LAB — GLUCOSE, CAPILLARY
Glucose-Capillary: 145 mg/dL — ABNORMAL HIGH (ref 70–99)
Glucose-Capillary: 147 mg/dL — ABNORMAL HIGH (ref 70–99)
Glucose-Capillary: 162 mg/dL — ABNORMAL HIGH (ref 70–99)

## 2020-03-11 LAB — PROTEIN / CREATININE RATIO, URINE
Creatinine, Urine: 71.58 mg/dL
Protein Creatinine Ratio: 0.15 mg/mg{Cre} (ref 0.00–0.15)
Total Protein, Urine: 11 mg/dL

## 2020-03-11 MED ORDER — METOPROLOL TARTRATE 50 MG PO TABS
50.0000 mg | ORAL_TABLET | Freq: Two times a day (BID) | ORAL | Status: DC
  Administered 2020-03-11 – 2020-03-12 (×3): 50 mg via ORAL
  Filled 2020-03-11 (×4): qty 1

## 2020-03-11 MED ORDER — PREDNISONE 20 MG PO TABS
30.0000 mg | ORAL_TABLET | Freq: Every day | ORAL | Status: DC
  Administered 2020-03-12: 30 mg via ORAL
  Filled 2020-03-11: qty 1

## 2020-03-11 NOTE — Progress Notes (Signed)
Dr Constance Haw made aware of leaking cvc, dressing changed and pt and site cleaned per policy.  Pt tolerated well

## 2020-03-11 NOTE — Progress Notes (Signed)
cvc line noted to be leaking at this time.  Dressing and gown with wet blood.  Pt wiggling trying to get out of bed.  Daughter at bedside sitting in chair.  Daughter educated on the cvc and asked to please not allow her mom to move around while I go get help, pt reminded not to attempt to move at this time due to leaking blood from line, charge nurse notified and was told she would notify PICC nurse on this floor

## 2020-03-11 NOTE — Progress Notes (Signed)
cvc dressing changed,  Site cleaned, disc changed, lines flushed.  Pt tolerated well.  Occlusive dressing applied

## 2020-03-11 NOTE — Progress Notes (Signed)
Spoke with patient about the use of a CPAP and she stated she had worn one before. I was hesitate to place patient  On CPAP seeing she has no record of wearing one or sleep study being done. Also she has been inconsistent with wearing of her nasal cannula. I set patient up on the CPAP with a nasal mask in cause she became uncomfortable. I bled 2-3 liters into the mask and sat with patient. She was doing fine until she realized that she would have to wear the mask most of the night. She proceeded to rip the mask off and refused to wear the mask. I placed back on her cannula and spoke to her nurse about what happened. The CPAP unit is in her room in case she may need during here stay at the hospital.

## 2020-03-11 NOTE — Progress Notes (Signed)
PROGRESS NOTE  Tracy [name] YIR:[id number] DOB: [birth year] DOA: 03/07/2020 PCP: Rory Percy, MD   Brief History: 59 year old female with a history of chronic respiratory failure on 3L,diastolic CHF, COPD, tobacco abuse, diabetes mellitus type 2, hypertension, remote cocaine use presenting with 1 week history of worsening lower extremity edema, generalized weakness, and dyspnea on exertion. The patient has had decreased oral intake. She was noted to have some confusion with hypoglycemia at home. As a result, the patient was brought for further evaluation. Patient had recent admission to the hospital from 02/15/2020 to 02/21/2020 for acute on chronic diastolic CHF.Serum creatinine 2.25 on 02/21/20. Serum creatinine noted to be 2.63 on 02/28/20. She was discharged home on furosemide 40 mg twice daily. Discharge weight was 122.6 kg(270lb).She followed up with cardiology in office on 02/28/20 and she was noted to be fluid overloaded. Weight on 02/28/20 was 257.2. Even though her weight was down from time of d/c she was felt to be clinically fluid overloaded and instructed to take lasix 80 mg bid x 4 days, then back to usual 40 mg bid. Upon presentation, the patient was noted to be fluid overloaded. Chest x-ray showed pulmonary vascular congestion. She was started on IV furosemide60 mg bid.  Assessment/Plan: Acute on chronic diastolic CHF/cor pulmonale -Patient remains clinically fluid overloaded -endorses dietary and fluid indiscretions -Continue IV furosemide -3/11 standing weight = 262.0 lbs -Accurate I's and O's -body habitus makes fluid status assessment difficult -appreciate cardiology  COPD exacerbation -Continue Pulmicort -Continue duo nebs -continue IV Solu-Medrol>>>po prednisone -continue azithromycin  Sepsis -present on admission -secondary to UTI -wbc 17.8 with lactate 2.7 -continue ceftriaxone -sepsis physiology resolved  Ecoi UTI -UA>50  WBC -Continue ceftriaxone   Chronic respiratory failure with hypoxia -Patient is chronically on 3 L nasal cannula  CKD stage IV -Baseline creatinine 2.2-2.6 -will need to tolerate worsening renal function for improved respiratory status -appreciate nephrology consult  Thrombocytopenia -Has been chronic, likely secondary to hepatic congestion from the patient's cor pulmonale  Paroxysmal atrial fibrillation,with RVR -Restart metoprolol-->increased to 50 mg bid -d/crivaroxaban due to worsen renal function -start apixaban once INR subtherapeutic  Diabetes mellitus type 2 -NovoLog sliding scale -Holding Jardiance and Amaryl -02/15/20 A1C--7.5  Hyponatremia -in part due to fluid overload -AM bmp  Tobacco abuse -Tobacco cessation discussed  Chronic leg pain -Restart home dose hydrocodone -Venous duplex--neg for DVT  Morbid Obesity -BMI 42.17 -lifestyle modification       Disposition Plan: Patient From: Home D/C Place: Home1-2Days when more euvolemic Barriers: Not Clinically Stable--remains fluid overloaded on IV lasix  Family Communication:Daughter updatedbedside 3/13  Consultants:none  Code Status: FULL   DVT Prophylaxis:xarelto   Procedures: As Listed in Progress Note Above  Antibiotics: Ceftriaxone 3/9>> azithro 3/9>>>3/13   Subjective: Patient denies fevers, chills, headache, chest pain, dyspnea, nausea, vomiting, diarrhea, abdominal pain, dysuria, hematuria, hematochezia, and melena.   Objective: Vitals:   03/11/20 0857 03/11/20 0859 03/11/20 1448 03/11/20 1456  BP:    (!) 117/96  Pulse:    (!) 109  Resp:    16  Temp:    (!) 97.5 F (36.4 C)  TempSrc:      SpO2: 93% 97% 92% 100%  Weight:      Height:        Intake/Output Summary (Last 24 hours) at 03/11/2020 1722 Last data filed at 03/11/2020 1500 Gross per 24 hour  Intake 240 ml  Output 1750 ml  Net -1510 ml  Weight change: -1.9  kg Exam:   General:  Pt is alert, follows commands appropriately, not in acute distress  HEENT: No icterus, No thrush, No neck mass, [place]/AT  Cardiovascular: IRRR, S1/S2, no rubs, no gallops Respiratory: bibasilar crackles, no wheeze  Abdomen: Soft/+BS, non tender, non distended, no guarding  Extremities: 1 +LE edema, No lymphangitis, No petechiae, No rashes, no synovitis   Data Reviewed: I have personally reviewed following labs and imaging studies Basic Metabolic Panel: Recent Labs  Lab 03/07/20 1107 03/08/20 0548 03/09/20 0447 03/10/20 0450 03/11/20 0610  NA 138 137 130* 133* 133*  K 3.5 4.8 4.5 4.2 3.9  CL 100 100 95* 97* 94*  CO2 24 20* 18* 19* 22  GLUCOSE 98 141* 203* 207* 147*  BUN 67* 66* 71* 83* 86*  CREATININE 2.63* 2.34* 2.81* 2.73* 2.53*  CALCIUM 8.2* 8.7* 8.7* 8.6* 8.7*   Liver Function Tests: Recent Labs  Lab 03/07/20 1107 03/08/20 0548 03/10/20 0450  AST 66* 44* 22  ALT 123* 95* 71*  ALKPHOS 315* 295* 286*  BILITOT 7.4* 6.2* 5.1*  PROT 5.7* 5.4* 5.4*  ALBUMIN 2.9* 2.5* 2.5*   No results for input(s): LIPASE, AMYLASE in the last 168 hours. No results for input(s): AMMONIA in the last 168 hours. Coagulation Profile: Recent Labs  Lab 03/07/20 1202 03/09/20 1040 03/10/20 1437 03/11/20 0610  INR 3.5* 3.0* 2.9* 3.1*   CBC: Recent Labs  Lab 03/07/20 1107 03/08/20 0548  WBC 17.8* 13.1*  NEUTROABS 16.1* 12.0*  HGB 12.6 12.3  HCT 43.0 42.3  MCV 94.9 94.0  PLT 82* 75*   Cardiac Enzymes: No results for input(s): CKTOTAL, CKMB, CKMBINDEX, TROPONINI in the last 168 hours. BNP: Invalid input(s): POCBNP CBG: Recent Labs  Lab 03/10/20 1144 03/10/20 1630 03/10/20 2107 03/11/20 1114 03/11/20 1632  GLUCAP 222* 237* 215* 145* 147*   HbA1C: No results for input(s): HGBA1C in the last 72 hours. Urine analysis:    Component Value Date/Time   COLORURINE AMBER (A) 03/07/2020 1107   APPEARANCEUR CLOUDY (A) 03/07/2020 1107   LABSPEC 1.013  03/07/2020 1107   PHURINE 5.0 03/07/2020 1107   GLUCOSEU 50 (A) 03/07/2020 1107   HGBUR LARGE (A) 03/07/2020 1107   BILIRUBINUR NEGATIVE 03/07/2020 1107   KETONESUR NEGATIVE 03/07/2020 1107   PROTEINUR 30 (A) 03/07/2020 1107   UROBILINOGEN 0.2 08/03/2013 2205   NITRITE NEGATIVE 03/07/2020 1107   LEUKOCYTESUR LARGE (A) 03/07/2020 1107   Sepsis Labs: @LABRCNTIP (procalcitonin:4,lacticidven:4) ) Recent Results (from the past 240 hour(s))  Urine Culture     Status: Abnormal   Collection Time: 03/07/20 11:07 AM   Specimen: Urine, Random  Result Value Ref Range Status   Specimen Description   Final    URINE, RANDOM Performed at [facility], [address] [place], [place] [address]    Special Requests   Final    NONE Performed at [facility], [address], [place], [place] [address]    Culture >=100,000 COLONIES/mL ESCHERICHIA COLI (A)  Final   Report Status 03/09/2020 FINAL  Final   Organism ID, Bacteria ESCHERICHIA COLI (A)  Final      Susceptibility   Escherichia coli - MIC*    AMPICILLIN >=32 RESISTANT Resistant     CEFAZOLIN <=4 SENSITIVE Sensitive     CEFTRIAXONE <=0.25 SENSITIVE Sensitive     CIPROFLOXACIN <=0.25 SENSITIVE Sensitive     GENTAMICIN <=1 SENSITIVE Sensitive     IMIPENEM <=0.25 SENSITIVE Sensitive     NITROFURANTOIN <=16 SENSITIVE Sensitive  TRIMETH/SULFA <=20 SENSITIVE Sensitive     AMPICILLIN/SULBACTAM >=32 RESISTANT Resistant     PIP/TAZO <=4 SENSITIVE Sensitive     * >=100,000 COLONIES/mL ESCHERICHIA COLI  Respiratory Panel by RT PCR (Flu A&B, Covid) - Nasopharyngeal Swab     Status: None   Collection Time: 03/07/20 11:53 AM   Specimen: Nasopharyngeal Swab  Result Value Ref Range Status   SARS Coronavirus 2 by RT PCR NEGATIVE NEGATIVE Final    Comment: (NOTE) SARS-CoV-2 target nucleic acids are NOT DETECTED. The SARS-CoV-2 RNA is generally detectable in upper respiratoy specimens during the acute phase of infection. The lowest concentration  of SARS-CoV-2 viral copies this assay can detect is 131 copies/mL. A negative result does not preclude SARS-Cov-2 infection and should not be used as the sole basis for treatment or other patient management decisions. A negative result may occur with  improper specimen collection/handling, submission of specimen other than nasopharyngeal swab, presence of viral mutation(s) within the areas targeted by this assay, and inadequate number of viral copies (<131 copies/mL). A negative result must be combined with clinical observations, patient history, and epidemiological information. The expected result is Negative. Fact Sheet for Patients:  PinkCheek.be Fact Sheet for Healthcare Providers:  GravelBags.it This test is not yet ap proved or cleared by the [place] FDA and  has been authorized for detection and/or diagnosis of SARS-CoV-2 by FDA under an Emergency Use Authorization (EUA). This EUA will remain  in effect (meaning this test can be used) for the duration of the COVID-19 declaration under Section 564(b)(1) of the Act, 21 U.S.C. section 360bbb-3(b)(1), unless the authorization is terminated or revoked sooner.    Influenza A by PCR NEGATIVE NEGATIVE Final   Influenza B by PCR NEGATIVE NEGATIVE Final    Comment: (NOTE) The Xpert Xpress SARS-CoV-2/FLU/RSV assay is intended as an aid in  the diagnosis of influenza from Nasopharyngeal swab specimens and  should not be used as a sole basis for treatment. Nasal washings and  aspirates are unacceptable for Xpert Xpress SARS-CoV-2/FLU/RSV  testing. Fact Sheet for Patients: PinkCheek.be Fact Sheet for Healthcare Providers: GravelBags.it This test is not yet approved or cleared by the [place] FDA and  has been authorized for detection and/or diagnosis of SARS-CoV-2 by  FDA under an Emergency Use Authorization (EUA).  This EUA will remain  in effect (meaning this test can be used) for the duration of the  Covid-19 declaration under Section 564(b)(1) of the Act, 21  U.S.C. section 360bbb-3(b)(1), unless the authorization is  terminated or revoked. Performed at [facility], [address] [place], [place] [address]      Scheduled Meds:  budesonide (PULMICORT) nebulizer solution  0.25 mg Nebulization BID   Chlorhexidine Gluconate Cloth  6 each Topical Daily   Chlorhexidine Gluconate Cloth  6 each Topical Q0600   furosemide  40 mg Intravenous BID   insulin aspart  0-9 Units Subcutaneous TID WC   ipratropium-albuterol  3 mL Nebulization TID   metoprolol tartrate  37.5 mg Oral BID   nicotine  21 mg Transdermal Daily   predniSONE  40 mg Oral Q breakfast   sodium chloride flush  10-40 mL Intracatheter Q12H   sodium chloride flush  3 mL Intravenous Q12H   Continuous Infusions:  sodium chloride     cefTRIAXone (ROCEPHIN)  IV 1 g (03/11/20 1653)    Procedures/Studies: US Venous Img Lower Bilateral (DVT)  Result Date: 03/08/2020 CLINICAL DATA:  Chronic bilateral lower extremity edema, worsened during the  past week. History of previous right lower extremity DVT (femoral and popliteal veins). History of smoking. Evaluate for DVT. EXAM: BILATERAL LOWER EXTREMITY VENOUS DOPPLER ULTRASOUND TECHNIQUE: Gray-scale sonography with graded compression, as well as color Doppler and duplex ultrasound were performed to evaluate the lower extremity deep venous systems from the level of the common femoral vein and including the common femoral, femoral, profunda femoral, popliteal and calf veins including the posterior tibial, peroneal and gastrocnemius veins when visible. The superficial great saphenous vein was also interrogated. Spectral Doppler was utilized to evaluate flow at rest and with distal augmentation maneuvers in the common femoral, femoral and popliteal veins. COMPARISON:  Bilateral lower extremity  venous Doppler ultrasound-09/22/2017 (negative). FINDINGS: Examination is degraded due to patient body habitus and poor sonographic window RIGHT LOWER EXTREMITY Common Femoral Vein: No evidence of thrombus. Normal compressibility, respiratory phasicity and response to augmentation. Saphenofemoral Junction: No evidence of thrombus. Normal compressibility and flow on color Doppler imaging. Profunda Femoral Vein: No evidence of thrombus. Normal compressibility and flow on color Doppler imaging. Femoral Vein: Appears patent where imaged. Popliteal Vein: No evidence of acute or chronic thrombus. Normal compressibility, respiratory phasicity and response to augmentation. Calf Veins: Appear patent where imaged. Superficial Great Saphenous Vein: No evidence of thrombus. Normal compressibility. Venous Reflux:  None. Other Findings:  None. LEFT LOWER EXTREMITY Common Femoral Vein: No evidence of thrombus. Normal compressibility, respiratory phasicity and response to augmentation. Saphenofemoral Junction: No evidence of thrombus. Normal compressibility and flow on color Doppler imaging. Profunda Femoral Vein: No evidence of thrombus. Normal compressibility and flow on color Doppler imaging. Femoral Vein: Appears patent where imaged. Popliteal Vein: No evidence of thrombus. Normal compressibility, respiratory phasicity and response to augmentation. Calf Veins: Appear patent where imaged. Superficial Great Saphenous Vein: No evidence of thrombus. Normal compressibility. Venous Reflux:  None. Other Findings:  None. IMPRESSION: No evidence of acute or chronic DVT within either lower extremity on this body habitus degraded examination. Electronically Signed   By: Sandi Mariscal M.D.   On: 03/08/2020 16:21   DG Chest Port 1 View  Result Date: 03/07/2020 CLINICAL DATA:  Shortness of breath EXAM: PORTABLE CHEST 1 VIEW COMPARISON:  02/17/2020 FINDINGS: Stable cardiomegaly. Calcific aortic knob. Mild vascular congestion and subtly  increased interstitial markings bilaterally. Linear atelectasis within the peripheral aspect of the left mid lung. The visualized skeletal structures are unremarkable. IMPRESSION: Cardiomegaly with mild pulmonary vascular congestion and subtly increased interstitial markings suggesting mild edema. Electronically Signed   By: Davina Poke D.O.   On: 03/07/2020 12:29   DG Chest Port 1 View  Result Date: 02/17/2020 CLINICAL DATA:  Shortness of breath EXAM: PORTABLE CHEST 1 VIEW COMPARISON:  February 14, 2020 FINDINGS: There is scarring in the right base region. There is mild atelectasis in the left base. There is stable cardiomegaly with pulmonary venous hypertension. No adenopathy. There is aortic atherosclerosis. No bone lesions. IMPRESSION: Cardiomegaly with pulmonary vascular congestion. Mild scarring right base. Mild atelectasis left base. No appreciable pulmonary edema. Aortic Atherosclerosis (ICD10-I70.0). Electronically Signed   By: Lowella Grip III M.D.   On: 02/17/2020 07:46   DG Chest Port 1 View  Result Date: 02/14/2020 CLINICAL DATA:  Shortness of breath and fluid retention. EXAM: PORTABLE CHEST 1 VIEW COMPARISON:  PA and lateral chest 02/08/2020 and 05/20/2019. FINDINGS: There is marked cardiomegaly and pulmonary vascular congestion. No consolidative process, pneumothorax or effusion. Atherosclerosis is noted. No acute or focal bony abnormality. IMPRESSION: Cardiomegaly and vascular congestion. Atherosclerosis. Electronically Signed  By: Inge Rise M.D.   On: 02/14/2020 17:45   ECHOCARDIOGRAM COMPLETE  Result Date: 02/16/2020    ECHOCARDIOGRAM REPORT   Patient Name:   Tracy [name] Date of Exam: 02/16/2020 Medical Rec #:  [id number]             Height:       66.0 in Accession #:    [id number]            Weight:       266.1 lb Date of Birth:  1961/11/23            BSA:          2.26 m Patient Age:    20 years              BP:           126/89 mmHg Patient Gender: F                      HR:           61 bpm. Exam Location:  [facility] Procedure: 2D Echo Indications:    Dyspnea 786.09 / R06.00  History:        Patient has prior history of Echocardiogram examinations, most                 recent 09/22/2017. CHF, Stroke and COPD; Risk Factors:Current                 Smoker, Diabetes and Hypertension. Crack cocaine use, RBBB,                 Acute renal failure , LVH , Acute respiratory failure with                 hypoxia.  Sonographer:    Leavy Cella RDCS (AE) Referring Phys: [id number] COURAGE EMOKPAE IMPRESSIONS  1. Left ventricular ejection fraction, by estimation, is 55 to 60%. The left ventricle has normal function. The left ventricle has no regional wall motion abnormalities. There is mild concentric left ventricular hypertrophy. Left ventricular diastolic parameters are indeterminate. Elevated left ventricular end-diastolic pressure.  2. Right ventricular systolic function is severely reduced. The right ventricular size is moderately enlarged. mildly increased right ventricular wall thickness. There is moderately elevated pulmonary artery systolic pressure.  3. Left atrial size was severely dilated.  4. Right atrial size was severely dilated.  5. The mitral valve is grossly normal. Mild mitral valve regurgitation.  6. Tricuspid valve regurgitation is moderate.  7. The aortic valve is tricuspid. Aortic valve regurgitation is not visualized. No aortic stenosis is present.  8. The inferior vena cava is dilated in size with >50% respiratory variability, suggesting right atrial pressure of 8 mmHg. FINDINGS  Left Ventricle: Left ventricular ejection fraction, by estimation, is 55 to 60%. The left ventricle has normal function. The left ventricle has no regional wall motion abnormalities. The left ventricular internal cavity size was normal in size. There is  mild concentric left ventricular hypertrophy. Left ventricular diastolic parameters are indeterminate. Elevated left ventricular  end-diastolic pressure. Right Ventricle: The right ventricular size is moderately enlarged. Mildly increased right ventricular wall thickness. Right ventricular systolic function is severely reduced. There is moderately elevated pulmonary artery systolic pressure. The tricuspid  regurgitant velocity is 3.12 m/s, and with an assumed right atrial pressure of 10 mmHg, the estimated right ventricular systolic pressure is 13.0 mmHg. Left Atrium: Left atrial size was severely dilated. Right Atrium:  Right atrial size was severely dilated. Pericardium: There is no evidence of pericardial effusion. Mitral Valve: The mitral valve is grossly normal. Mild mitral annular calcification. Mild mitral valve regurgitation. Tricuspid Valve: The tricuspid valve is grossly normal. Tricuspid valve regurgitation is moderate. Aortic Valve: The aortic valve is tricuspid. Aortic valve regurgitation is not visualized. No aortic stenosis is present. Pulmonic Valve: The pulmonic valve was grossly normal. Pulmonic valve regurgitation is not visualized. Aorta: The aortic root is normal in size and structure. Venous: The inferior vena cava is dilated in size with greater than 50% respiratory variability, suggesting right atrial pressure of 8 mmHg. IAS/Shunts: No atrial level shunt detected by color flow Doppler.  LEFT VENTRICLE PLAX 2D LVIDd:         4.54 cm  Diastology LVIDs:         2.77 cm  LV e' lateral:   11.20 cm/s LV PW:         1.40 cm  LV E/e' lateral: 7.8 LV IVS:        1.16 cm  LV e' medial:    4.24 cm/s LVOT diam:     2.10 cm  LV E/e' medial:  20.7 LV SV Index:   26.96 LVOT Area:     3.46 cm  RIGHT VENTRICLE RV S prime:     8.05 cm/s LEFT ATRIUM              Index       RIGHT ATRIUM           Index LA diam:        4.40 cm  1.95 cm/m  RA Area:     41.10 cm LA Vol (A2C):   143.0 ml 63.34 ml/m RA Volume:   176.00 ml 77.95 ml/m LA Vol (A4C):   99.1 ml  43.89 ml/m LA Biplane Vol: 121.0 ml 53.59 ml/m   AORTA Ao Root diam: 3.40 cm  MITRAL VALVE               TRICUSPID VALVE MV Area (PHT): 4.06 cm    TR Peak grad:   38.9 mmHg MV Decel Time: 187 msec    TR Vmax:        312.00 cm/s MR Peak grad: 62.1 mmHg MR Vmax:      394.00 cm/s  SHUNTS MV E velocity: 87.80 cm/s  Systemic Diam: 2.10 cm MV A velocity: 47.50 cm/s MV E/A ratio:  1.85 Kate Sable MD Electronically signed by Kate Sable MD Signature Date/Time: 02/16/2020/4:32:20 PM    Final    US Abdomen Limited RUQ  Result Date: 03/07/2020 CLINICAL DATA:  Right upper quadrant pain and elevated LFTs EXAM: ULTRASOUND ABDOMEN LIMITED RIGHT UPPER QUADRANT COMPARISON:  02/16/2020 FINDINGS: Gallbladder: No gallstones or wall thickening visualized. No sonographic Murphy sign noted by sonographer. Common bile duct: Diameter: 4 mm Liver: No focal lesion identified. Within normal limits in parenchymal echogenicity. Portal vein is patent on color Doppler imaging with normal direction of blood flow towards the liver. Other: None. IMPRESSION: No acute abnormality noted. Electronically Signed   By: Inez Catalina M.D.   On: 03/07/2020 15:05   US Abdomen Limited RUQ  Result Date: 02/16/2020 CLINICAL DATA:  Bilirubinemia EXAM: ULTRASOUND ABDOMEN LIMITED RIGHT UPPER QUADRANT COMPARISON:  None. FINDINGS: Gallbladder: No gallstones or wall thickening visualized. No sonographic Murphy sign noted by sonographer. Common bile duct: Diameter: 3 mm Liver: No focal lesion identified. Within normal limits in parenchymal echogenicity. Portal vein is patent on color  Doppler imaging with normal direction of blood flow towards the liver. IMPRESSION: Negative right upper quadrant ultrasound. Electronically Signed   By: Monte Fantasia M.D.   On: 02/16/2020 10:40    Orson Eva, DO  [facility]  If 7PM-7AM, please contact night-coverage [email or web address] Password [email or web address] 03/11/2020, 5:22 PM   LOS: 4 days

## 2020-03-11 NOTE — Progress Notes (Signed)
Patient continues to refuse to wear CPA at night and unit is on stand by in patient room if needed. Patient continues to pull off her nasal cannula as well. Nursing and RT will continue to montior the patient.

## 2020-03-11 NOTE — Progress Notes (Signed)
Urine collected and sent to lab as ordered.

## 2020-03-11 NOTE — Progress Notes (Addendum)
Called by Charge RN to evaluate leaking Femoral CVC. Serosanguinous drainage noted on dressing, antimicrobial disc saturated, that was just previously changed at 1000 today by primary RN per chart. Dressing changed using sterile technique, site cleansed thoroughly with chlorhexidine and skin allowed to dry completely prior to applying the new sterile, occlusive dressing. All connections checked, tightened. All three lumens flushed with 10 mls sterile normal saline after verification of blood return.   Primary RN notified of dressing change, site status, and advised pt will most likely need more frequent dressing changes due to weeping skin.   Dr Constance Haw, who was previously notified of issue of leaking CVC, updated while on floor of line assessment and status.

## 2020-03-11 NOTE — Progress Notes (Signed)
Subjective:   BP soft, Afib with rate as high as 100.  1550 of UOP on lasix 40 IV bid-  Only negative 630- bag is full today.  crt trending down slightly.  Essentially refused CPAP.  She is not participating in conversation today but there is a family member here trying to encourage her to take part.  Had some issue with her femoral IV   Objective Vital signs in last 24 hours: Vitals:   03/10/20 2232 03/11/20 0541 03/11/20 0857 03/11/20 0859  BP:  101/84    Pulse: 82 (!) 101    Resp: 18 20    Temp:  97.9 F (36.6 C)    TempSrc:  Oral    SpO2: 95% 97% 93% 97%  Weight:  118.7 kg    Height:       Weight change: -1.9 kg  Intake/Output Summary (Last 24 hours) at 03/11/2020 1345 Last data filed at 03/11/2020 0839 Gross per 24 hour  Intake 600 ml  Output 1550 ml  Net -950 ml    Assessment/ Plan: Pt is a 59 y.o. yo female with HTN, DM, right sided heart failure and afib- mult admits for volume overload who was admitted on 03/07/2020 with volume overload.  She also has CKD progressive over time  Assessment/Plan:  1.  AKI on CKD vs progression of CKV 3b IV--> UA reflective of UTI.  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  She unfortunately hasn't been able to adhere to salt and fluid restrictions.  I forsee her creatinine overall uptrending over time to a point where we may have to consider dialysis.  I agree with IV Lasix and expect fluctuations in Cr d/t volume status. So far crt trending in right direction. Cannot be tto aggressive with diuretics given BP and HR.  We'll get her an appointment with our practice for when she discharges too. I"ll do a UP/C but unlikely she could get ACEi/ARB d/t fluctuating Cr.  She is not interested in taking part in her health  2.  Acute on chronic CHF exacerbation with R sided heart failure: has cor pulmonale, cardiology consulted, appreciate assistance.    3.  Afib: on metop and warfarin  3.  E. Coli UTI: on  ceftriaxone  4.  COPD: on 3L home O2, being treated with azithro and pred- still smoking   5.  Possible OSA: ordered CPAP at night -  Pt refused but machine at bedside.  I encouraged her to give in another go    Tracy [name]    Labs: Basic Metabolic Panel: Recent Labs  Lab 03/09/20 0447 03/10/20 0450 03/11/20 0610  NA 130* 133* 133*  K 4.5 4.2 3.9  CL 95* 97* 94*  CO2 18* 19* 22  GLUCOSE 203* 207* 147*  BUN 71* 83* 86*  CREATININE 2.81* 2.73* 2.53*  CALCIUM 8.7* 8.6* 8.7*   Liver Function Tests: Recent Labs  Lab 03/07/20 1107 03/08/20 0548 03/10/20 0450  AST 66* 44* 22  ALT 123* 95* 71*  ALKPHOS 315* 295* 286*  BILITOT 7.4* 6.2* 5.1*  PROT 5.7* 5.4* 5.4*  ALBUMIN 2.9* 2.5* 2.5*   No results for input(s): LIPASE, AMYLASE in the last 168 hours. No results for input(s): AMMONIA in the last 168 hours. CBC: Recent Labs  Lab 03/07/20 1107 03/08/20 0548  WBC 17.8* 13.1*  NEUTROABS 16.1* 12.0*  HGB 12.6 12.3  HCT 43.0 42.3  MCV 94.9 94.0  PLT 82* 75*   Cardiac Enzymes:  No results for input(s): CKTOTAL, CKMB, CKMBINDEX, TROPONINI in the last 168 hours. CBG: Recent Labs  Lab 03/10/20 0750 03/10/20 1144 03/10/20 1630 03/10/20 2107 03/11/20 1114  GLUCAP 194* 222* 237* 215* 145*    Iron Studies: No results for input(s): IRON, TIBC, TRANSFERRIN, FERRITIN in the last 72 hours. Studies/Results: No results found. Medications: Infusions: . sodium chloride    . cefTRIAXone (ROCEPHIN)  IV 1 g (03/09/20 1735)    Scheduled Medications: . budesonide (PULMICORT) nebulizer solution  0.25 mg Nebulization BID  . Chlorhexidine Gluconate Cloth  6 each Topical Daily  . Chlorhexidine Gluconate Cloth  6 each Topical Q0600  . furosemide  40 mg Intravenous BID  . insulin aspart  0-9 Units Subcutaneous TID WC  . ipratropium-albuterol  3 mL Nebulization TID  . metoprolol tartrate  37.5 mg Oral BID  . nicotine  21 mg Transdermal Daily  . predniSONE  40 mg  Oral Q breakfast  . sodium chloride flush  10-40 mL Intracatheter Q12H  . sodium chloride flush  3 mL Intravenous Q12H    have reviewed scheduled and prn medications.  Physical Exam: General: very disinterested in anything I have to say-  Purse lipped breathing as they are working on her IV.  Family member says she refuses to wear O2 but wont say why Heart: tachy , irreg Lungs: ant with CBS Abdomen: obese, some abd wall edema  Extremities: woody chronic edema as well as pitting edema    03/11/2020,1:45 PM  LOS: 4 days

## 2020-03-12 LAB — BASIC METABOLIC PANEL
Anion gap: 17 — ABNORMAL HIGH (ref 5–15)
BUN: 89 mg/dL — ABNORMAL HIGH (ref 6–20)
CO2: 20 mmol/L — ABNORMAL LOW (ref 22–32)
Calcium: 9 mg/dL (ref 8.9–10.3)
Chloride: 96 mmol/L — ABNORMAL LOW (ref 98–111)
Creatinine, Ser: 2.5 mg/dL — ABNORMAL HIGH (ref 0.44–1.00)
GFR calc Af Amer: 24 mL/min — ABNORMAL LOW (ref >60)
GFR calc non Af Amer: 20 mL/min — ABNORMAL LOW (ref >60)
Glucose, Bld: 74 mg/dL (ref 70–99)
Potassium: 4.8 mmol/L (ref 3.5–5.1)
Sodium: 133 mmol/L — ABNORMAL LOW (ref 135–145)

## 2020-03-12 LAB — CBC
HCT: 47.4 % — ABNORMAL HIGH (ref 36.0–46.0)
Hemoglobin: 13.7 g/dL (ref 12.0–15.0)
MCH: 27.3 pg (ref 26.0–34.0)
MCHC: 28.9 g/dL — ABNORMAL LOW (ref 30.0–36.0)
MCV: 94.6 fL (ref 80.0–100.0)
Platelets: 73 10*3/uL — ABNORMAL LOW (ref 150–400)
RBC: 5.01 MIL/uL (ref 3.87–5.11)
RDW: 24.6 % — ABNORMAL HIGH (ref 11.5–15.5)
WBC: 10.5 10*3/uL (ref 4.0–10.5)
nRBC: 9 % — ABNORMAL HIGH (ref 0.0–0.2)

## 2020-03-12 LAB — GLUCOSE, CAPILLARY
Glucose-Capillary: 70 mg/dL (ref 70–99)
Glucose-Capillary: 121 mg/dL — ABNORMAL HIGH (ref 70–99)
Glucose-Capillary: 133 mg/dL — ABNORMAL HIGH (ref 70–99)
Glucose-Capillary: 160 mg/dL — ABNORMAL HIGH (ref 70–99)

## 2020-03-12 LAB — PROTIME-INR
INR: 4.8 (ref 0.8–1.2)
Prothrombin Time: 45.2 seconds — ABNORMAL HIGH (ref 11.4–15.2)

## 2020-03-12 MED ORDER — PREDNISONE 20 MG PO TABS
20.0000 mg | ORAL_TABLET | Freq: Every day | ORAL | Status: DC
  Filled 2020-03-12: qty 1

## 2020-03-12 NOTE — Progress Notes (Signed)
PROGRESS NOTE  Tracy [name] MVE:[id number] DOB: 03/08/1961 DOA: 03/07/2020 PCP: Rory Percy, MD   Brief History: 59 year old female with a history of chronic respiratory failure on 3L,diastolic CHF, COPD, tobacco abuse, diabetes mellitus type 2, hypertension, remote cocaine use presenting with 1 week history of worsening lower extremity edema, generalized weakness, and dyspnea on exertion. The patient has had decreased oral intake. She was noted to have some confusion with hypoglycemia at home. As a result, the patient was brought for further evaluation. Patient had recent admission to the hospital from 02/15/2020 to 02/21/2020 for acute on chronic diastolic CHF.Serum creatinine 2.25 on 02/21/20. Serum creatinine noted to be 2.63 on 02/28/20. She was discharged home on furosemide 40 mg twice daily. Discharge weight was 122.6 kg(270lb).She followed up with cardiology in office on 02/28/20 and she was noted to be fluid overloaded. Weight on 02/28/20 was 257.2. Even though her weight was down from time of d/c she was felt to be clinically fluid overloaded and instructed to take lasix 80 mg bid x 4 days, then back to usual 40 mg bid. Upon presentation, the patient was noted to be fluid overloaded. Chest x-ray showed pulmonary vascular congestion. She was started on IV furosemide60 mg bid.  Assessment/Plan: Acute on chronic diastolic CHF/cor pulmonale -Patient remains clinically fluid overloaded -endorses dietary and fluid indiscretions -Continue IV furosemide -3/14 standing weight = 256.1 lbs--NEG 10lbs -Accurate I's and O's -body habitus makes fluid status assessment difficult -appreciate cardiology  COPD exacerbation -Continue Pulmicort -Continue duo nebs -continue IV Solu-Medrol>>>po prednisone -finished 5 days azithromycin  Sepsis -present on admission -secondary to UTI -wbc 17.8 with lactate 2.7 -continue ceftriaxone -sepsis physiology  resolved  Ecoi UTI -UA>50 WBC -Continue ceftriaxone  D#6/7  supratherapeutic INR -likely due to chronic hepatic congestion  Chronic respiratory failure with hypoxia -Patient is chronically on 3 L nasal cannula  CKD stage IV -Baseline creatinine 2.2-2.6 -will need to tolerate worsening renal function for improved respiratory status -appreciate nephrology consult  Thrombocytopenia -Has been chronic, likely secondary to hepatic congestion from the patient's cor pulmonale  Paroxysmal atrial fibrillation,with RVR -Restart metoprolol-->increased to 50 mg bid -d/crivaroxaban due to worsen renal function -start apixabanonce INR subtherapeutic  Diabetes mellitus type 2 -NovoLog sliding scale -Holding Jardiance and Amaryl -02/15/20 A1C--7.5  Hyponatremia -in part due to fluid overload -AM bmp  Tobacco abuse -Tobacco cessation discussed  Chronic leg pain -Restart home dose hydrocodone -Venous duplex--neg for DVT  Morbid Obesity -BMI 42.17 -lifestyle modification       Disposition Plan: Patient From: Home D/C Place: SNF--1-2Days when more euvolemic Barriers: Not Clinically Stable--remains fluid overloaded on IV lasix  Family Communication:Daughter updatedbedside 3/13  Consultants:none  Code Status: FULL   DVT Prophylaxis:xarelto   Procedures: As Listed in Progress Note Above  Antibiotics: Ceftriaxone 3/9>> azithro 3/9>>>3/13     Subjective: Pt seems apathetic today.   She states she is breathing better.  Denies cp, f/c, n/v/d, abd pain  Objective: Vitals:   03/12/20 0805 03/12/20 0816 03/12/20 1354 03/12/20 1421  BP:    107/77  Pulse: 76   (!) 47  Resp:    18  Temp:      TempSrc:      SpO2: 94% 100% 95% 95%  Weight:      Height:        Intake/Output Summary (Last 24 hours) at 03/12/2020 1524 Last data filed at 03/12/2020 0841 Gross per 24 hour  Intake 480  ml  Output 820 ml  Net -340 ml   Weight  change: -2.5 kg Exam:   General:  Pt is alert, follows commands appropriately, not in acute distress  HEENT: No icterus, No thrush, No neck mass, [place]/AT  Cardiovascular: IRRR, S1/S2, no rubs, no gallops  Respiratory: bibasilar crackles. No wheeze  Abdomen: Soft/+BS, non tender, non distended, no guarding  Extremities: 1+LE edema, No lymphangitis, No petechiae, No rashes, no synovitis   Data Reviewed: I have personally reviewed following labs and imaging studies Basic Metabolic Panel: Recent Labs  Lab 03/08/20 0548 03/09/20 0447 03/10/20 0450 03/11/20 0610 03/12/20 0633  NA 137 130* 133* 133* 133*  K 4.8 4.5 4.2 3.9 4.8  CL 100 95* 97* 94* 96*  CO2 20* 18* 19* 22 20*  GLUCOSE 141* 203* 207* 147* 74  BUN 66* 71* 83* 86* 89*  CREATININE 2.34* 2.81* 2.73* 2.53* 2.50*  CALCIUM 8.7* 8.7* 8.6* 8.7* 9.0   Liver Function Tests: Recent Labs  Lab 03/07/20 1107 03/08/20 0548 03/10/20 0450  AST 66* 44* 22  ALT 123* 95* 71*  ALKPHOS 315* 295* 286*  BILITOT 7.4* 6.2* 5.1*  PROT 5.7* 5.4* 5.4*  ALBUMIN 2.9* 2.5* 2.5*   No results for input(s): LIPASE, AMYLASE in the last 168 hours. No results for input(s): AMMONIA in the last 168 hours. Coagulation Profile: Recent Labs  Lab 03/07/20 1202 03/09/20 1040 03/10/20 1437 03/11/20 0610 03/12/20 0633  INR 3.5* 3.0* 2.9* 3.1* 4.8*   CBC: Recent Labs  Lab 03/07/20 1107 03/08/20 0548 03/12/20 1010  WBC 17.8* 13.1* 10.5  NEUTROABS 16.1* 12.0*  --   HGB 12.6 12.3 13.7  HCT 43.0 42.3 47.4*  MCV 94.9 94.0 94.6  PLT 82* 75* 73*   Cardiac Enzymes: No results for input(s): CKTOTAL, CKMB, CKMBINDEX, TROPONINI in the last 168 hours. BNP: Invalid input(s): POCBNP CBG: Recent Labs  Lab 03/11/20 1114 03/11/20 1632 03/11/20 2121 03/12/20 0730 03/12/20 1117  GLUCAP 145* 147* 162* 70 121*   HbA1C: No results for input(s): HGBA1C in the last 72 hours. Urine analysis:    Component Value Date/Time   COLORURINE AMBER (A)  03/07/2020 1107   APPEARANCEUR CLOUDY (A) 03/07/2020 1107   LABSPEC 1.013 03/07/2020 1107   PHURINE 5.0 03/07/2020 1107   GLUCOSEU 50 (A) 03/07/2020 1107   HGBUR LARGE (A) 03/07/2020 1107   BILIRUBINUR NEGATIVE 03/07/2020 1107   KETONESUR NEGATIVE 03/07/2020 1107   PROTEINUR 30 (A) 03/07/2020 1107   UROBILINOGEN 0.2 08/03/2013 2205   NITRITE NEGATIVE 03/07/2020 1107   LEUKOCYTESUR LARGE (A) 03/07/2020 1107   Sepsis Labs: @LABRCNTIP (procalcitonin:4,lacticidven:4) ) Recent Results (from the past 240 hour(s))  Urine Culture     Status: Abnormal   Collection Time: 03/07/20 11:07 AM   Specimen: Urine, Random  Result Value Ref Range Status   Specimen Description   Final    URINE, RANDOM Performed at [facility], [address] [place], [place] [address]    Special Requests   Final    NONE Performed at [facility], [address], [place], [place] [address]    Culture >=100,000 COLONIES/mL ESCHERICHIA COLI (A)  Final   Report Status 03/09/2020 FINAL  Final   Organism ID, Bacteria ESCHERICHIA COLI (A)  Final      Susceptibility   Escherichia coli - MIC*    AMPICILLIN >=32 RESISTANT Resistant     CEFAZOLIN <=4 SENSITIVE Sensitive     CEFTRIAXONE <=0.25 SENSITIVE Sensitive     CIPROFLOXACIN <=0.25 SENSITIVE Sensitive  GENTAMICIN <=1 SENSITIVE Sensitive     IMIPENEM <=0.25 SENSITIVE Sensitive     NITROFURANTOIN <=16 SENSITIVE Sensitive     TRIMETH/SULFA <=20 SENSITIVE Sensitive     AMPICILLIN/SULBACTAM >=32 RESISTANT Resistant     PIP/TAZO <=4 SENSITIVE Sensitive     * >=100,000 COLONIES/mL ESCHERICHIA COLI  Respiratory Panel by RT PCR (Flu A&B, Covid) - Nasopharyngeal Swab     Status: None   Collection Time: 03/07/20 11:53 AM   Specimen: Nasopharyngeal Swab  Result Value Ref Range Status   SARS Coronavirus 2 by RT PCR NEGATIVE NEGATIVE Final    Comment: (NOTE) SARS-CoV-2 target nucleic acids are NOT DETECTED. The SARS-CoV-2 RNA is generally detectable in upper  respiratoy specimens during the acute phase of infection. The lowest concentration of SARS-CoV-2 viral copies this assay can detect is 131 copies/mL. A negative result does not preclude SARS-Cov-2 infection and should not be used as the sole basis for treatment or other patient management decisions. A negative result may occur with  improper specimen collection/handling, submission of specimen other than nasopharyngeal swab, presence of viral mutation(s) within the areas targeted by this assay, and inadequate number of viral copies (<131 copies/mL). A negative result must be combined with clinical observations, patient history, and epidemiological information. The expected result is Negative. Fact Sheet for Patients:  PinkCheek.be Fact Sheet for Healthcare Providers:  GravelBags.it This test is not yet ap proved or cleared by the [place] FDA and  has been authorized for detection and/or diagnosis of SARS-CoV-2 by FDA under an Emergency Use Authorization (EUA). This EUA will remain  in effect (meaning this test can be used) for the duration of the COVID-19 declaration under Section 564(b)(1) of the Act, 21 U.S.C. section 360bbb-3(b)(1), unless the authorization is terminated or revoked sooner.    Influenza A by PCR NEGATIVE NEGATIVE Final   Influenza B by PCR NEGATIVE NEGATIVE Final    Comment: (NOTE) The Xpert Xpress SARS-CoV-2/FLU/RSV assay is intended as an aid in  the diagnosis of influenza from Nasopharyngeal swab specimens and  should not be used as a sole basis for treatment. Nasal washings and  aspirates are unacceptable for Xpert Xpress SARS-CoV-2/FLU/RSV  testing. Fact Sheet for Patients: PinkCheek.be Fact Sheet for Healthcare Providers: GravelBags.it This test is not yet approved or cleared by the [place] FDA and  has been authorized for  detection and/or diagnosis of SARS-CoV-2 by  FDA under an Emergency Use Authorization (EUA). This EUA will remain  in effect (meaning this test can be used) for the duration of the  Covid-19 declaration under Section 564(b)(1) of the Act, 21  U.S.C. section 360bbb-3(b)(1), unless the authorization is  terminated or revoked. Performed at [facility], [address], [place], [place] [address]      Scheduled Meds: . budesonide (PULMICORT) nebulizer solution  0.25 mg Nebulization BID  . Chlorhexidine Gluconate Cloth  6 each Topical Daily  . Chlorhexidine Gluconate Cloth  6 each Topical Q0600  . furosemide  40 mg Intravenous BID  . insulin aspart  0-9 Units Subcutaneous TID WC  . ipratropium-albuterol  3 mL Nebulization TID  . metoprolol tartrate  50 mg Oral BID  . nicotine  21 mg Transdermal Daily  . predniSONE  30 mg Oral Q breakfast  . sodium chloride flush  10-40 mL Intracatheter Q12H  . sodium chloride flush  3 mL Intravenous Q12H   Continuous Infusions: . sodium chloride    . cefTRIAXone (ROCEPHIN)  IV 1 g (03/11/20 1653)  Procedures/Studies: US Venous Img Lower Bilateral (DVT)  Result Date: 03/08/2020 CLINICAL DATA:  Chronic bilateral lower extremity edema, worsened during the past week. History of previous right lower extremity DVT (femoral and popliteal veins). History of smoking. Evaluate for DVT. EXAM: BILATERAL LOWER EXTREMITY VENOUS DOPPLER ULTRASOUND TECHNIQUE: Gray-scale sonography with graded compression, as well as color Doppler and duplex ultrasound were performed to evaluate the lower extremity deep venous systems from the level of the common femoral vein and including the common femoral, femoral, profunda femoral, popliteal and calf veins including the posterior tibial, peroneal and gastrocnemius veins when visible. The superficial great saphenous vein was also interrogated. Spectral Doppler was utilized to evaluate flow at rest and with distal augmentation maneuvers  in the common femoral, femoral and popliteal veins. COMPARISON:  Bilateral lower extremity venous Doppler ultrasound-09/22/2017 (negative). FINDINGS: Examination is degraded due to patient body habitus and poor sonographic window RIGHT LOWER EXTREMITY Common Femoral Vein: No evidence of thrombus. Normal compressibility, respiratory phasicity and response to augmentation. Saphenofemoral Junction: No evidence of thrombus. Normal compressibility and flow on color Doppler imaging. Profunda Femoral Vein: No evidence of thrombus. Normal compressibility and flow on color Doppler imaging. Femoral Vein: Appears patent where imaged. Popliteal Vein: No evidence of acute or chronic thrombus. Normal compressibility, respiratory phasicity and response to augmentation. Calf Veins: Appear patent where imaged. Superficial Great Saphenous Vein: No evidence of thrombus. Normal compressibility. Venous Reflux:  None. Other Findings:  None. LEFT LOWER EXTREMITY Common Femoral Vein: No evidence of thrombus. Normal compressibility, respiratory phasicity and response to augmentation. Saphenofemoral Junction: No evidence of thrombus. Normal compressibility and flow on color Doppler imaging. Profunda Femoral Vein: No evidence of thrombus. Normal compressibility and flow on color Doppler imaging. Femoral Vein: Appears patent where imaged. Popliteal Vein: No evidence of thrombus. Normal compressibility, respiratory phasicity and response to augmentation. Calf Veins: Appear patent where imaged. Superficial Great Saphenous Vein: No evidence of thrombus. Normal compressibility. Venous Reflux:  None. Other Findings:  None. IMPRESSION: No evidence of acute or chronic DVT within either lower extremity on this body habitus degraded examination. Electronically Signed   By: Sandi Mariscal M.D.   On: 03/08/2020 16:21   DG Chest Port 1 View  Result Date: 03/07/2020 CLINICAL DATA:  Shortness of breath EXAM: PORTABLE CHEST 1 VIEW COMPARISON:  02/17/2020  FINDINGS: Stable cardiomegaly. Calcific aortic knob. Mild vascular congestion and subtly increased interstitial markings bilaterally. Linear atelectasis within the peripheral aspect of the left mid lung. The visualized skeletal structures are unremarkable. IMPRESSION: Cardiomegaly with mild pulmonary vascular congestion and subtly increased interstitial markings suggesting mild edema. Electronically Signed   By: Davina Poke D.O.   On: 03/07/2020 12:29   DG Chest Port 1 View  Result Date: 02/17/2020 CLINICAL DATA:  Shortness of breath EXAM: PORTABLE CHEST 1 VIEW COMPARISON:  February 14, 2020 FINDINGS: There is scarring in the right base region. There is mild atelectasis in the left base. There is stable cardiomegaly with pulmonary venous hypertension. No adenopathy. There is aortic atherosclerosis. No bone lesions. IMPRESSION: Cardiomegaly with pulmonary vascular congestion. Mild scarring right base. Mild atelectasis left base. No appreciable pulmonary edema. Aortic Atherosclerosis (ICD10-I70.0). Electronically Signed   By: Lowella Grip III M.D.   On: 02/17/2020 07:46   DG Chest Port 1 View  Result Date: 02/14/2020 CLINICAL DATA:  Shortness of breath and fluid retention. EXAM: PORTABLE CHEST 1 VIEW COMPARISON:  PA and lateral chest 02/08/2020 and 05/20/2019. FINDINGS: There is marked cardiomegaly and pulmonary vascular congestion. No  consolidative process, pneumothorax or effusion. Atherosclerosis is noted. No acute or focal bony abnormality. IMPRESSION: Cardiomegaly and vascular congestion. Atherosclerosis. Electronically Signed   By: Inge Rise M.D.   On: 02/14/2020 17:45   ECHOCARDIOGRAM COMPLETE  Result Date: 02/16/2020    ECHOCARDIOGRAM REPORT   Patient Name:   TIARRA ANASTACIO Date of Exam: 02/16/2020 Medical Rec #:  [id number]             Height:       66.0 in Accession #:    [id number]            Weight:       266.1 lb Date of Birth:  Feb 12, 1961            BSA:          2.26 m  Patient Age:    46 years              BP:           126/89 mmHg Patient Gender: F                     HR:           61 bpm. Exam Location:  [facility] Procedure: 2D Echo Indications:    Dyspnea 786.09 / R06.00  History:        Patient has prior history of Echocardiogram examinations, most                 recent 09/22/2017. CHF, Stroke and COPD; Risk Factors:Current                 Smoker, Diabetes and Hypertension. Crack cocaine use, RBBB,                 Acute renal failure , LVH , Acute respiratory failure with                 hypoxia.  Sonographer:    Leavy Cella RDCS (AE) Referring Phys: [id number] COURAGE EMOKPAE IMPRESSIONS  1. Left ventricular ejection fraction, by estimation, is 55 to 60%. The left ventricle has normal function. The left ventricle has no regional wall motion abnormalities. There is mild concentric left ventricular hypertrophy. Left ventricular diastolic parameters are indeterminate. Elevated left ventricular end-diastolic pressure.  2. Right ventricular systolic function is severely reduced. The right ventricular size is moderately enlarged. mildly increased right ventricular wall thickness. There is moderately elevated pulmonary artery systolic pressure.  3. Left atrial size was severely dilated.  4. Right atrial size was severely dilated.  5. The mitral valve is grossly normal. Mild mitral valve regurgitation.  6. Tricuspid valve regurgitation is moderate.  7. The aortic valve is tricuspid. Aortic valve regurgitation is not visualized. No aortic stenosis is present.  8. The inferior vena cava is dilated in size with >50% respiratory variability, suggesting right atrial pressure of 8 mmHg. FINDINGS  Left Ventricle: Left ventricular ejection fraction, by estimation, is 55 to 60%. The left ventricle has normal function. The left ventricle has no regional wall motion abnormalities. The left ventricular internal cavity size was normal in size. There is  mild concentric left ventricular  hypertrophy. Left ventricular diastolic parameters are indeterminate. Elevated left ventricular end-diastolic pressure. Right Ventricle: The right ventricular size is moderately enlarged. Mildly increased right ventricular wall thickness. Right ventricular systolic function is severely reduced. There is moderately elevated pulmonary artery systolic pressure. The tricuspid  regurgitant velocity is 3.12 m/s, and with an assumed right  atrial pressure of 10 mmHg, the estimated right ventricular systolic pressure is 16.1 mmHg. Left Atrium: Left atrial size was severely dilated. Right Atrium: Right atrial size was severely dilated. Pericardium: There is no evidence of pericardial effusion. Mitral Valve: The mitral valve is grossly normal. Mild mitral annular calcification. Mild mitral valve regurgitation. Tricuspid Valve: The tricuspid valve is grossly normal. Tricuspid valve regurgitation is moderate. Aortic Valve: The aortic valve is tricuspid. Aortic valve regurgitation is not visualized. No aortic stenosis is present. Pulmonic Valve: The pulmonic valve was grossly normal. Pulmonic valve regurgitation is not visualized. Aorta: The aortic root is normal in size and structure. Venous: The inferior vena cava is dilated in size with greater than 50% respiratory variability, suggesting right atrial pressure of 8 mmHg. IAS/Shunts: No atrial level shunt detected by color flow Doppler.  LEFT VENTRICLE PLAX 2D LVIDd:         4.54 cm  Diastology LVIDs:         2.77 cm  LV e' lateral:   11.20 cm/s LV PW:         1.40 cm  LV E/e' lateral: 7.8 LV IVS:        1.16 cm  LV e' medial:    4.24 cm/s LVOT diam:     2.10 cm  LV E/e' medial:  20.7 LV SV Index:   26.96 LVOT Area:     3.46 cm  RIGHT VENTRICLE RV S prime:     8.05 cm/s LEFT ATRIUM              Index       RIGHT ATRIUM           Index LA diam:        4.40 cm  1.95 cm/m  RA Area:     41.10 cm LA Vol (A2C):   143.0 ml 63.34 ml/m RA Volume:   176.00 ml 77.95 ml/m LA Vol  (A4C):   99.1 ml  43.89 ml/m LA Biplane Vol: 121.0 ml 53.59 ml/m   AORTA Ao Root diam: 3.40 cm MITRAL VALVE               TRICUSPID VALVE MV Area (PHT): 4.06 cm    TR Peak grad:   38.9 mmHg MV Decel Time: 187 msec    TR Vmax:        312.00 cm/s MR Peak grad: 62.1 mmHg MR Vmax:      394.00 cm/s  SHUNTS MV E velocity: 87.80 cm/s  Systemic Diam: 2.10 cm MV A velocity: 47.50 cm/s MV E/A ratio:  1.85 Kate Sable MD Electronically signed by Kate Sable MD Signature Date/Time: 02/16/2020/4:32:20 PM    Final    US Abdomen Limited RUQ  Result Date: 03/07/2020 CLINICAL DATA:  Right upper quadrant pain and elevated LFTs EXAM: ULTRASOUND ABDOMEN LIMITED RIGHT UPPER QUADRANT COMPARISON:  02/16/2020 FINDINGS: Gallbladder: No gallstones or wall thickening visualized. No sonographic Murphy sign noted by sonographer. Common bile duct: Diameter: 4 mm Liver: No focal lesion identified. Within normal limits in parenchymal echogenicity. Portal vein is patent on color Doppler imaging with normal direction of blood flow towards the liver. Other: None. IMPRESSION: No acute abnormality noted. Electronically Signed   By: Inez Catalina M.D.   On: 03/07/2020 15:05   US Abdomen Limited RUQ  Result Date: 02/16/2020 CLINICAL DATA:  Bilirubinemia EXAM: ULTRASOUND ABDOMEN LIMITED RIGHT UPPER QUADRANT COMPARISON:  None. FINDINGS: Gallbladder: No gallstones or wall thickening visualized. No sonographic Murphy sign noted by  sonographer. Common bile duct: Diameter: 3 mm Liver: No focal lesion identified. Within normal limits in parenchymal echogenicity. Portal vein is patent on color Doppler imaging with normal direction of blood flow towards the liver. IMPRESSION: Negative right upper quadrant ultrasound. Electronically Signed   By: Monte Fantasia M.D.   On: 02/16/2020 10:40    Orson Eva, DO  [facility]  If 7PM-7AM, please contact night-coverage [email or web address] Password [email or web address] 03/12/2020, 3:24 PM   LOS: 5 days

## 2020-03-12 NOTE — NC FL2 (Signed)
[place] MEDICAID FL2 LEVEL OF CARE SCREENING TOOL     IDENTIFICATION  Patient Name: Tracy [name] Birthdate: [birth year] Sex: female Admission Date (Current Location): 03/07/2020  [facility] and [place] Number:  [employer] and Address:  [facility] [address], [place]      Provider Number: [id number]  Attending Physician Name and Address:  Orson Eva, MD  Relative Name and Phone Number:       Current Level of Care: Hospital Recommended Level of Care: [place] Prior Approval Number:    Date Approved/Denied:   PASRR Number: [id number]  Discharge Plan: SNF    Current Diagnoses: Patient Active Problem List   Diagnosis Date Noted  . Sepsis due to Escherichia coli (E. coli) ([place]) 03/09/2020  . Acute cystitis with hematuria   . Elevated LFTs   . CKD (chronic kidney disease) stage 4, GFR 15-29 ml/min (HCC) 03/08/2020  . Anasarca   . Poor intravenous access   . Acute on chronic diastolic CHF (congestive heart failure) ([place]) 03/07/2020  . Acute on chronic right-sided heart failure ([place]) 03/07/2020  . Chronic respiratory failure with hypoxia ([place]) 03/07/2020  . Acute lower UTI 03/07/2020  . Paroxysmal A-fib ([place]) 02/16/2020  . Acute exacerbation of CHF (congestive heart failure) ([place]) 02/15/2020  . Acute respiratory failure with hypoxia ([place]) 02/15/2020  . Acute on chronic heart failure with preserved ejection fraction (HFpEF) ([place]) 02/15/2020  . H/O: CVA (cerebrovascular accident)/ 2013 02/15/2020  . Diabetes mellitus type 2, uncontrolled ([place]) 02/15/2020  . Rt DVT (deep venous thrombosis) -2014 02/15/2020  . Anticoagulated on Coumadin 02/15/2020  . Right bundle branch block 09/05/2017  . Long term current use of anticoagulant therapy 03/06/2017  . Body mass index (BMI) of 40.0-44.9 in adult ([place]) 02/20/2017  . Personal history of venous thrombosis and embolism 02/20/2017  . Umbilical hernia 30/16/0109  .  Phlebitis and thrombophlebitis of lower extremities 01/13/2017  . Left knee pain 03/05/2016  . Influenza with respiratory manifestations 03/22/2015  . COPD exacerbation ([place]) 03/21/2015  . CAP (community acquired pneumonia) 07/04/2013  . Sepsis ([place]) 07/04/2013  . Acute renal failure ([place]) 07/04/2013  . Left thyroid nodule 07/04/2013  . Essential hypertension 07/04/2013  . Diabetes ([place]) 07/04/2013  . Acute respiratory failure ([place]) 07/04/2013  . Hemiparesis, acute ([place]) 07/04/2012  . LVH (left ventricular hypertrophy) 07/04/2012  . Acute ischemic stroke ([place]) 07/03/2012  . Hypertensive urgency 07/03/2012  . Tobacco abuse 07/03/2012  . Noncompliance 07/03/2012  . Thrombocytopenia ([place]) 07/03/2012  . Crack cocaine use 07/03/2012    Orientation RESPIRATION BLADDER Height & Weight     Self, Time, Situation, Place  O2(see dc summary) Incontinent Weight: 256 lb 2.8 oz (116.2 kg) Height:  5\' 6"  (167.6 cm)  BEHAVIORAL SYMPTOMS/MOOD NEUROLOGICAL BOWEL NUTRITION STATUS      Continent Diet(see dc summary)  AMBULATORY STATUS COMMUNICATION OF NEEDS Skin   Extensive Assist Verbally Normal                       Personal Care Assistance Level of Assistance  Bathing, Feeding, Dressing Bathing Assistance: Limited assistance Feeding assistance: Independent Dressing Assistance: Limited assistance     Functional Limitations Info  Sight, Hearing, Speech Sight Info: Adequate Hearing Info: Adequate Speech Info: Adequate    SPECIAL CARE FACTORS FREQUENCY  PT (By licensed PT), OT (By licensed OT)     PT Frequency: 5 times weekly OT Frequency: 3 times weekly  Contractures Contractures Info: Not present    Additional Factors Info  Code Status, Allergies Code Status Info: Full Allergies Info: NKA           Current Medications (03/12/2020):  This is the current hospital active medication list Current Facility-Administered Medications  Medication Dose Route  Frequency Provider Last Rate Last Admin  . 0.9 %  sodium chloride infusion  250 mL Intravenous PRN Kathie Dike, MD      . acetaminophen (TYLENOL) tablet 650 mg  650 mg Oral Q4H PRN Kathie Dike, MD      . albuterol (PROVENTIL) (2.5 MG/3ML) 0.083% nebulizer solution 2.5 mg  2.5 mg Nebulization Q6H PRN Kathie Dike, MD      . budesonide (PULMICORT) nebulizer solution 0.25 mg  0.25 mg Nebulization BID Kathie Dike, MD   0.25 mg at 03/12/20 0815  . cefTRIAXone (ROCEPHIN) 1 g in sodium chloride 0.9 % 100 mL IVPB  1 g Intravenous Q24H Kathie Dike, MD 200 mL/hr at 03/11/20 1653 1 g at 03/11/20 1653  . Chlorhexidine Gluconate Cloth 2 % PADS 6 each  6 each Topical Daily Kathie Dike, MD   6 each at 03/11/20 1000  . Chlorhexidine Gluconate Cloth 2 % PADS 6 each  6 each Topical Q0600 Virl Cagey, MD   6 each at 03/11/20 1000  . furosemide (LASIX) injection 40 mg  40 mg Intravenous BID Orson Eva, MD   40 mg at 03/12/20 0807  . HYDROcodone-acetaminophen (NORCO) 7.5-325 MG per tablet 1 tablet  1 tablet Oral Q8H PRN Orson Eva, MD   1 tablet at 03/12/20 0117  . insulin aspart (novoLOG) injection 0-9 Units  0-9 Units Subcutaneous TID WC Kathie Dike, MD   1 Units at 03/12/20 1150  . ipratropium-albuterol (DUONEB) 0.5-2.5 (3) MG/3ML nebulizer solution 3 mL  3 mL Nebulization TID Kathie Dike, MD   3 mL at 03/12/20 1352  . metoprolol tartrate (LOPRESSOR) tablet 50 mg  50 mg Oral BID Orson Eva, MD   50 mg at 03/12/20 0807  . nicotine (NICODERM CQ - dosed in mg/24 hours) patch 21 mg  21 mg Transdermal Daily Kathie Dike, MD   21 mg at 03/12/20 0810  . ondansetron (ZOFRAN) injection 4 mg  4 mg Intravenous Q6H PRN Kathie Dike, MD      . predniSONE (DELTASONE) tablet 30 mg  30 mg Oral Q breakfast Tat, David, MD   30 mg at 03/12/20 0807  . sodium chloride flush (NS) 0.9 % injection 10-40 mL  10-40 mL Intracatheter Q12H Virl Cagey, MD   10 mL at 03/12/20 0807  . sodium  chloride flush (NS) 0.9 % injection 10-40 mL  10-40 mL Intracatheter PRN Virl Cagey, MD   10 mL at 03/09/20 1225  . sodium chloride flush (NS) 0.9 % injection 3 mL  3 mL Intravenous Q12H Kathie Dike, MD   3 mL at 03/12/20 0811  . sodium chloride flush (NS) 0.9 % injection 3 mL  3 mL Intravenous PRN Kathie Dike, MD         Discharge Medications: Please see discharge summary for a list of discharge medications.  Relevant Imaging Results:  Relevant Lab Results:   Additional Information SSN: [id number], [place]

## 2020-03-12 NOTE — TOC Initial Note (Signed)
Transition of Care [facility]) - Initial/Assessment Note    Patient Details  Name: Tracy [name] MRN: [id number] Date of Birth: 11/11/1961  Transition [facility]) CM/SW Contact:    Shade Flood, LCSW Phone Number: 03/12/2020, 1:57 PM  Clinical Narrative:                  Pt admitted from home. Pt was recently discharged from [facility] (02/21/20) with Alvis Lemmings for [facility] PT. PT recommending SNF at this time. Spoke with pt today to review PT recommendations. Pt agreeable to SNF referrals. Discussed options and will refer as requested. Pt will need insurance auth prior to transfer.  TOC will follow.  Expected Discharge Plan: Skilled Nursing Facility Barriers to Discharge: Continued Medical Work up   Patient Goals and CMS Choice Patient states their goals for this hospitalization and ongoing recovery are:: to go home CMS Medicare.gov Compare Post Acute Care list provided to:: Patient Choice offered to / list presented to : Patient  Expected Discharge Plan and Services Expected Discharge Plan: [place] In-house Referral: Clinical Social Work   Post Acute Care Choice: [place] Living arrangements for the past 2 months: [place]                                      Prior Living Arrangements/Services Living arrangements for the past 2 months: Single Family Home Lives with:: Adult Children Patient language and need for interpreter reviewed:: Yes Do you feel safe going back to the place where you live?: Yes      Need for Family Participation in Patient Care: Yes (Comment) Care giver support system in place?: Yes (comment) Current home services: DME, Home PT Criminal Activity/Legal Involvement Pertinent to Current Situation/Hospitalization: No - Comment as needed  Activities of Daily Living Home Assistive Devices/Equipment: Cane (specify quad or straight) ADL Screening (condition at time of admission) Patient's cognitive ability  adequate to safely complete daily activities?: Yes Is the patient deaf or have difficulty hearing?: No Does the patient have difficulty seeing, even when wearing glasses/contacts?: No Does the patient have difficulty concentrating, remembering, or making decisions?: No Patient able to express need for assistance with ADLs?: Yes Does the patient have difficulty dressing or bathing?: Yes Independently performs ADLs?: No Communication: Independent Dressing (OT): Needs assistance Is this a change from baseline?: Pre-admission baseline Grooming: Needs assistance Is this a change from baseline?: Pre-admission baseline Feeding: Independent Bathing: Needs assistance Is this a change from baseline?: Pre-admission baseline Toileting: Needs assistance Is this a change from baseline?: Pre-admission baseline In/Out Bed: Needs assistance Is this a change from baseline?: Pre-admission baseline Walks in Home: Needs assistance Is this a change from baseline?: Pre-admission baseline Does the patient have difficulty walking or climbing stairs?: Yes Weakness of Legs: Both Weakness of Arms/Hands: None  Permission Sought/Granted Permission sought to share information with : Chartered certified accountant granted to share information with : Yes, Verbal Permission Granted     Permission granted to share info w AGENCY: local snfs        Emotional Assessment     Affect (typically observed): Flat Orientation: : Oriented to Self, Oriented to Place, Oriented to  Time, Oriented to Situation Alcohol / Substance Use: Not Applicable Psych Involvement: No (comment)  Admission diagnosis:  Anasarca [R60.1] Elevated LFTs [R79.89] Acute cystitis with hematuria [N30.01] Acute on chronic diastolic CHF (congestive heart failure) ([place]) [  I50.33] Patient Active Problem List   Diagnosis Date Noted  . Sepsis due to Escherichia coli (E. coli) ([place]) 03/09/2020  . Acute cystitis with hematuria   .  Elevated LFTs   . CKD (chronic kidney disease) stage 4, GFR 15-29 ml/min (HCC) 03/08/2020  . Anasarca   . Poor intravenous access   . Acute on chronic diastolic CHF (congestive heart failure) ([place]) 03/07/2020  . Acute on chronic right-sided heart failure ([place]) 03/07/2020  . Chronic respiratory failure with hypoxia ([place]) 03/07/2020  . Acute lower UTI 03/07/2020  . Paroxysmal A-fib ([place]) 02/16/2020  . Acute exacerbation of CHF (congestive heart failure) ([place]) 02/15/2020  . Acute respiratory failure with hypoxia ([place]) 02/15/2020  . Acute on chronic heart failure with preserved ejection fraction (HFpEF) ([place]) 02/15/2020  . H/O: CVA (cerebrovascular accident)/ 2013 02/15/2020  . Diabetes mellitus type 2, uncontrolled ([place]) 02/15/2020  . Rt DVT (deep venous thrombosis) -2014 02/15/2020  . Anticoagulated on Coumadin 02/15/2020  . Right bundle branch block 09/05/2017  . Long term current use of anticoagulant therapy 03/06/2017  . Body mass index (BMI) of 40.0-44.9 in adult ([place]) 02/20/2017  . Personal history of venous thrombosis and embolism 02/20/2017  . Umbilical hernia 05/09/210  . Phlebitis and thrombophlebitis of lower extremities 01/13/2017  . Left knee pain 03/05/2016  . Influenza with respiratory manifestations 03/22/2015  . COPD exacerbation ([place]) 03/21/2015  . CAP (community acquired pneumonia) 07/04/2013  . Sepsis ([place]) 07/04/2013  . Acute renal failure ([place]) 07/04/2013  . Left thyroid nodule 07/04/2013  . Essential hypertension 07/04/2013  . Diabetes ([place]) 07/04/2013  . Acute respiratory failure ([place]) 07/04/2013  . Hemiparesis, acute ([place]) 07/04/2012  . LVH (left ventricular hypertrophy) 07/04/2012  . Acute ischemic stroke ([place]) 07/03/2012  . Hypertensive urgency 07/03/2012  . Tobacco abuse 07/03/2012  . Noncompliance 07/03/2012  . Thrombocytopenia ([place]) 07/03/2012  . Crack cocaine use 07/03/2012   PCP:  Rory Percy, MD Pharmacy:   [place], [place] - [place] [address] [place] [address] [place] [address] Phone: [phone] Fax: [phone]     Social Determinants of Health (SDOH) Interventions    Readmission Risk Interventions Readmission Risk Prevention Plan 03/12/2020 02/18/2020  Transportation Screening Complete Complete  Medication Review Press photographer) Complete Complete  PCP or Specialist appointment within 3-5 days of discharge - Not Complete  HRI or [place] - Complete  SW Recovery Care/Counseling Consult - Complete  Palliative Care Screening - Not Complete  Skilled [place] - Not Complete  Some recent data might be hidden

## 2020-03-12 NOTE — Progress Notes (Signed)
Subjective:   BP soft, Afib with rate as high now as 159.  2020 cc of UOP on lasix 40 IV bid-   negative 1300.  crt stable.  Essentially refused CPAP. She still seems disinterested    Objective Vital signs in last 24 hours: Vitals:   03/12/20 0543 03/12/20 0544 03/12/20 0805 03/12/20 0816  BP: 102/81     Pulse: (!) 40  76   Resp: 17     Temp: 98 F (36.7 C)     TempSrc: Oral     SpO2: 99%  94% 100%  Weight:  116.2 kg    Height:       Weight change: -2.5 kg  Intake/Output Summary (Last 24 hours) at 03/12/2020 1252 Last data filed at 03/12/2020 0841 Gross per 24 hour  Intake 720 ml  Output 2020 ml  Net -1300 ml    Assessment/ Plan: Pt is a 59 y.o. yo female with HTN, DM, right sided heart failure and afib- mult admits for volume overload who was admitted on 03/07/2020 with volume overload.  She also has CKD that has been progressive over time  Assessment/Plan:  1.  AKI on CKD vs progression of CKV 3b IV--> UA reflective of UTI.  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 Protein to crt ratio is low.   She unfortunately hasn't been able to adhere to salt and fluid restrictions and does not seem to be engaged AT ALL regarding her health.   I agree with IV Lasix and would expect fluctuations in Cr d/t volume status. So far crt no worse. Cannot be to aggressive with diuretics given BP and HR.    2.  Acute on chronic CHF exacerbation with R sided heart failure: has cor pulmonale, cardiology consulted, appreciate assistance.  At a rate of 1300 negative per day this will be a very slow process.  Would need her cooperation as an OP to keep it going  3.  Afib: on metop and warfarin- rate not well controlled unfortunately   3.  E. Coli UTI: on ceftriaxone  4.  COPD: on 3L home O2, being treated with azithro and pred- still smoking   5.  Possible OSA: ordered CPAP at night -  Pt refused now  6. Dispo-  Attempted to have a frank discussion with her about her  health.  We need to have her cooperation to make this work,  She is heading down a path to bad things- it may already be too far gone.  If her poor cooperation continues, she will be less of a candidate for more aggressive treatments such as dialysis    Louis Meckel    Labs: Basic Metabolic Panel: Recent Labs  Lab 03/10/20 0450 03/11/20 0610 03/12/20 0633  NA 133* 133* 133*  K 4.2 3.9 4.8  CL 97* 94* 96*  CO2 19* 22 20*  GLUCOSE 207* 147* 74  BUN 83* 86* 89*  CREATININE 2.73* 2.53* 2.50*  CALCIUM 8.6* 8.7* 9.0   Liver Function Tests: Recent Labs  Lab 03/07/20 1107 03/08/20 0548 03/10/20 0450  AST 66* 44* 22  ALT 123* 95* 71*  ALKPHOS 315* 295* 286*  BILITOT 7.4* 6.2* 5.1*  PROT 5.7* 5.4* 5.4*  ALBUMIN 2.9* 2.5* 2.5*   No results for input(s): LIPASE, AMYLASE in the last 168 hours. No results for input(s): AMMONIA in the last 168 hours. CBC: Recent Labs  Lab 03/07/20 1107 03/08/20 0548 03/12/20 1010  WBC 17.8* 13.1* 10.5  NEUTROABS 16.1* 12.0*  --   HGB 12.6 12.3 13.7  HCT 43.0 42.3 47.4*  MCV 94.9 94.0 94.6  PLT 82* 75* 73*   Cardiac Enzymes: No results for input(s): CKTOTAL, CKMB, CKMBINDEX, TROPONINI in the last 168 hours. CBG: Recent Labs  Lab 03/11/20 1114 03/11/20 1632 03/11/20 2121 03/12/20 0730 03/12/20 1117  GLUCAP 145* 147* 162* 70 121*    Iron Studies: No results for input(s): IRON, TIBC, TRANSFERRIN, FERRITIN in the last 72 hours. Studies/Results: No results found. Medications: Infusions: . sodium chloride    . cefTRIAXone (ROCEPHIN)  IV 1 g (03/11/20 1653)    Scheduled Medications: . budesonide (PULMICORT) nebulizer solution  0.25 mg Nebulization BID  . Chlorhexidine Gluconate Cloth  6 each Topical Daily  . Chlorhexidine Gluconate Cloth  6 each Topical Q0600  . furosemide  40 mg Intravenous BID  . insulin aspart  0-9 Units Subcutaneous TID WC  . ipratropium-albuterol  3 mL Nebulization TID  . metoprolol tartrate  50  mg Oral BID  . nicotine  21 mg Transdermal Daily  . predniSONE  30 mg Oral Q breakfast  . sodium chloride flush  10-40 mL Intracatheter Q12H  . sodium chloride flush  3 mL Intravenous Q12H    have reviewed scheduled and prn medications.  Physical Exam: General: very disinterested in anything I have to say-  Breathing is more comfortable than yest but not wearing O2- wont say why.    Heart: tachy , irreg Lungs: ant with CBS Abdomen: obese, some abd wall edema  Extremities: woody chronic edema as well as pitting edema    03/12/2020,12:52 PM  LOS: 5 days

## 2020-03-13 ENCOUNTER — Inpatient Hospital Stay (HOSPITAL_COMMUNITY): Payer: Medicare Other

## 2020-03-13 DIAGNOSIS — G9341 Metabolic encephalopathy: Secondary | ICD-10-CM

## 2020-03-13 DIAGNOSIS — J9621 Acute and chronic respiratory failure with hypoxia: Secondary | ICD-10-CM

## 2020-03-13 LAB — CBC
HCT: 51.9 % — ABNORMAL HIGH (ref 36.0–46.0)
Hemoglobin: 15 g/dL (ref 12.0–15.0)
MCH: 27.4 pg (ref 26.0–34.0)
MCHC: 28.9 g/dL — ABNORMAL LOW (ref 30.0–36.0)
MCV: 94.7 fL (ref 80.0–100.0)
Platelets: 83 10*3/uL — ABNORMAL LOW (ref 150–400)
RBC: 5.48 MIL/uL — ABNORMAL HIGH (ref 3.87–5.11)
RDW: 25.1 % — ABNORMAL HIGH (ref 11.5–15.5)
WBC: 14.1 10*3/uL — ABNORMAL HIGH (ref 4.0–10.5)
nRBC: 11 % — ABNORMAL HIGH (ref 0.0–0.2)

## 2020-03-13 LAB — BASIC METABOLIC PANEL
BUN: 97 mg/dL — ABNORMAL HIGH (ref 6–20)
BUN: 101 mg/dL — ABNORMAL HIGH (ref 6–20)
CO2: 14 mmol/L — ABNORMAL LOW (ref 22–32)
CO2: 13 mmol/L — ABNORMAL LOW (ref 22–32)
Calcium: 9 mg/dL (ref 8.9–10.3)
Calcium: 9.1 mg/dL (ref 8.9–10.3)
Chloride: 96 mmol/L — ABNORMAL LOW (ref 98–111)
Chloride: 96 mmol/L — ABNORMAL LOW (ref 98–111)
Creatinine, Ser: 2.92 mg/dL — ABNORMAL HIGH (ref 0.44–1.00)
Creatinine, Ser: 3.02 mg/dL — ABNORMAL HIGH (ref 0.44–1.00)
GFR calc Af Amer: 20 mL/min — ABNORMAL LOW (ref >60)
GFR calc Af Amer: 19 mL/min — ABNORMAL LOW (ref >60)
GFR calc non Af Amer: 17 mL/min — ABNORMAL LOW (ref >60)
GFR calc non Af Amer: 16 mL/min — ABNORMAL LOW (ref >60)
Glucose, Bld: 97 mg/dL (ref 70–99)
Glucose, Bld: 121 mg/dL — ABNORMAL HIGH (ref 70–99)
Potassium: 5.3 mmol/L — ABNORMAL HIGH (ref 3.5–5.1)
Potassium: 5.2 mmol/L — ABNORMAL HIGH (ref 3.5–5.1)
Sodium: 134 mmol/L — ABNORMAL LOW (ref 135–145)
Sodium: 134 mmol/L — ABNORMAL LOW (ref 135–145)

## 2020-03-13 LAB — BLOOD GAS, ARTERIAL
Acid-base deficit: 15.5 mmol/L — ABNORMAL HIGH (ref 0.0–2.0)
Acid-base deficit: 8.2 mmol/L — ABNORMAL HIGH (ref 0.0–2.0)
Bicarbonate: 13.8 mmol/L — ABNORMAL LOW (ref 20.0–28.0)
Bicarbonate: 18.5 mmol/L — ABNORMAL LOW (ref 20.0–28.0)
FIO2: 60
FIO2: 32
O2 Saturation: 97.8 %
O2 Saturation: 95.3 %
Patient temperature: 37
Patient temperature: 36.5
pCO2 arterial: 19.2 mmHg — CL (ref 32.0–48.0)
pCO2 arterial: 27.9 mmHg — ABNORMAL LOW (ref 32.0–48.0)
pH, Arterial: 7.319 — ABNORMAL LOW (ref 7.350–7.450)
pH, Arterial: 7.375 (ref 7.350–7.450)
pO2, Arterial: 137 mmHg — ABNORMAL HIGH (ref 83.0–108.0)
pO2, Arterial: 87.8 mmHg (ref 83.0–108.0)

## 2020-03-13 LAB — HEPATIC FUNCTION PANEL
ALT: 56 U/L — ABNORMAL HIGH (ref 0–44)
AST: 33 U/L (ref 15–41)
Albumin: 2.5 g/dL — ABNORMAL LOW (ref 3.5–5.0)
Alkaline Phosphatase: 278 U/L — ABNORMAL HIGH (ref 38–126)
Bilirubin, Direct: 5.5 mg/dL — ABNORMAL HIGH (ref 0.0–0.2)
Indirect Bilirubin: 3.7 mg/dL — ABNORMAL HIGH (ref 0.3–0.9)
Total Bilirubin: 9.2 mg/dL — ABNORMAL HIGH (ref 0.3–1.2)
Total Protein: 5.3 g/dL — ABNORMAL LOW (ref 6.5–8.1)

## 2020-03-13 LAB — BRAIN NATRIURETIC PEPTIDE: B Natriuretic Peptide: 942 pg/mL — ABNORMAL HIGH (ref 0.0–100.0)

## 2020-03-13 LAB — URINALYSIS, COMPLETE (UACMP) WITH MICROSCOPIC
Glucose, UA: 100 mg/dL — AB
Ketones, ur: NEGATIVE mg/dL
Nitrite: NEGATIVE
Protein, ur: 30 mg/dL — AB
RBC / HPF: >50 RBC/hpf (ref 0–5)
Specific Gravity, Urine: >1.03 — ABNORMAL HIGH (ref 1.005–1.030)
pH: 5 (ref 5.0–8.0)

## 2020-03-13 LAB — GLUCOSE, CAPILLARY
Glucose-Capillary: 92 mg/dL (ref 70–99)
Glucose-Capillary: 54 mg/dL — ABNORMAL LOW (ref 70–99)
Glucose-Capillary: 107 mg/dL — ABNORMAL HIGH (ref 70–99)
Glucose-Capillary: 110 mg/dL — ABNORMAL HIGH (ref 70–99)
Glucose-Capillary: 99 mg/dL (ref 70–99)
Glucose-Capillary: 126 mg/dL — ABNORMAL HIGH (ref 70–99)
Glucose-Capillary: 132 mg/dL — ABNORMAL HIGH (ref 70–99)

## 2020-03-13 LAB — MRSA PCR SCREENING: MRSA by PCR: NEGATIVE

## 2020-03-13 LAB — AMMONIA: Ammonia: 26 umol/L (ref 9–35)

## 2020-03-13 LAB — PROTIME-INR
INR: 6.2 (ref 0.8–1.2)
Prothrombin Time: 54.9 seconds — ABNORMAL HIGH (ref 11.4–15.2)

## 2020-03-13 LAB — PROCALCITONIN: Procalcitonin: 2.81 ng/mL

## 2020-03-13 MED ORDER — SODIUM BICARBONATE 8.4 % IV SOLN
50.0000 meq | Freq: Once | INTRAVENOUS | Status: AC
  Administered 2020-03-13: 50 meq via INTRAVENOUS

## 2020-03-13 MED ORDER — CALCIUM GLUCONATE-NACL 1-0.675 GM/50ML-% IV SOLN
1.0000 g | Freq: Once | INTRAVENOUS | Status: AC
  Administered 2020-03-13: 1000 mg via INTRAVENOUS
  Filled 2020-03-13: qty 50

## 2020-03-13 MED ORDER — LORAZEPAM 2 MG/ML IJ SOLN
0.5000 mg | Freq: Once | INTRAMUSCULAR | Status: AC
  Administered 2020-03-13: 0.5 mg via INTRAVENOUS

## 2020-03-13 MED ORDER — DEXTROSE 50 % IV SOLN
INTRAVENOUS | Status: AC
  Filled 2020-03-13: qty 50

## 2020-03-13 MED ORDER — METOPROLOL TARTRATE 5 MG/5ML IV SOLN
5.0000 mg | Freq: Four times a day (QID) | INTRAVENOUS | Status: DC
  Administered 2020-03-13 – 2020-03-16 (×11): 5 mg via INTRAVENOUS
  Filled 2020-03-13 (×13): qty 5

## 2020-03-13 MED ORDER — SODIUM BICARBONATE 8.4 % IV SOLN: 50.0000 meq | Freq: Once | INTRAVENOUS | Status: DC

## 2020-03-13 MED ORDER — VITAMIN K1 10 MG/ML IJ SOLN
5.0000 mg | Freq: Once | INTRAVENOUS | Status: AC
  Administered 2020-03-13: 5 mg via INTRAVENOUS
  Filled 2020-03-13: qty 0.5

## 2020-03-13 MED ORDER — VANCOMYCIN HCL 2000 MG/400ML IV SOLN
2000.0000 mg | Freq: Once | INTRAVENOUS | Status: AC
  Administered 2020-03-13: 2000 mg via INTRAVENOUS
  Filled 2020-03-13: qty 400

## 2020-03-13 MED ORDER — NALOXONE HCL 0.4 MG/ML IJ SOLN
INTRAMUSCULAR | Status: AC
  Filled 2020-03-13: qty 1

## 2020-03-13 MED ORDER — LORAZEPAM 2 MG/ML IJ SOLN
INTRAMUSCULAR | Status: AC
  Filled 2020-03-13: qty 1

## 2020-03-13 MED ORDER — VANCOMYCIN HCL 1500 MG/300ML IV SOLN: 1500.0000 mg | INTRAVENOUS | Status: DC

## 2020-03-13 MED ORDER — SODIUM BICARBONATE 8.4 % IV SOLN
50.0000 meq | Freq: Once | INTRAVENOUS | Status: DC
  Filled 2020-03-13: qty 50

## 2020-03-13 MED ORDER — SODIUM CHLORIDE 0.9 % IV SOLN
2.0000 g | INTRAVENOUS | Status: DC
  Administered 2020-03-13 – 2020-03-14 (×2): 2 g via INTRAVENOUS
  Filled 2020-03-13 (×2): qty 2

## 2020-03-13 MED ORDER — NALOXONE HCL 0.4 MG/ML IJ SOLN: 0.4000 mg | Freq: Once | INTRAMUSCULAR | Status: DC

## 2020-03-13 NOTE — Progress Notes (Addendum)
Patient still refuses to wear CPA at night. CPAP on standby if patient chooses to wear. Patient continues to wear 3 liters [place]. RT will continue to monitor patient.

## 2020-03-13 NOTE — Progress Notes (Signed)
Pharmacy Antibiotic Note  Danielly C Rosten is a 59 y.o. female admitted on 03/07/2020 with HCAP/pneumonia.  Pharmacy has been consulted for Vancomycin and cefepime dosing.  Plan: Vancomycin 2000mg  loading dose, then 1500mg   IV every 48 hours.  Goal trough 15-20 mcg/mL.  Cefepime 2gm IV q24h F/U cxs and clinical progress Monitor V/S, labs, and levels as indicated  Height: 5\' 6"  (167.6 cm) Weight: 256 lb 6.3 oz (116.3 kg) IBW/kg (Calculated) : 59.3  Temp (24hrs), Avg:97.7 F (36.5 C), Min:97.5 F (36.4 C), Max:98.1 F (36.7 C)  Recent Labs  Lab 03/07/20 1107 03/07/20 1107 03/07/20 1152 03/08/20 0548 03/09/20 0447 03/10/20 0450 03/11/20 0610 03/12/20 0633 03/12/20 1010 03/13/20 0452 03/13/20 0916  WBC 17.8*  --   --  13.1*  --   --   --   --  10.5  --  14.1*  CREATININE 2.63*   < >  --  2.34*   < > 2.73* 2.53* 2.50*  --  2.92* 3.02*  LATICACIDVEN  --   --  2.7*  --   --   --   --   --   --   --   --    < > = values in this interval not displayed.    Estimated Creatinine Clearance: 26 mL/min (A) (by C-G formula based on SCr of 3.02 mg/dL (H)).    No Known Allergies  Antimicrobials this admission: Vancomycin 3/15>>  Cefepime  3/15 >>  Ceftriaxone 3/9>> 3/15 Azithromycin 3/9>> 3/13  Dose adjustments this admission: prn  Microbiology results: 3/15 BCx: pending 3/15 UCx: pending 3/9 UCx: e.coli >100K CFU/ml  s- cefazolin, ceftriaxone R-unasyn  2/17 MRSA PCR: negative  Thank you for allowing pharmacy to be a part of this patient's care.  Isac Sarna, BS Pharm D, BCPS Clinical Pharmacist Pager [phone] 03/13/2020 1:05 PM

## 2020-03-13 NOTE — Progress Notes (Signed)
Inpatient Diabetes Program Recommendations  AACE/ADA: New Consensus Statement on Inpatient Glycemic Control (2015)  Target Ranges:  Prepandial:   less than 140 mg/dL      Peak postprandial:   less than 180 mg/dL (1-2 hours)      Critically ill patients:  140 - 180 mg/dL   Lab Results  Component Value Date   GLUCAP 110 (H) 03/13/2020   HGBA1C 7.5 (H) 02/15/2020    Review of Glycemic Control Results for Tracy [name], Tracy [name] (MRN [id number]) as of 03/13/2020 12:14  Ref. Range 03/13/2020 08:37 03/13/2020 11:09  Glucose-Capillary Latest Ref Range: 70 - 99 mg/dL 107 (H) 110 (H)   Inpatient Diabetes Program Recommendations:    Consider reducing Novolog correction to very sensitive 0-6 units tid with meals.   Thanks  Adah Perl, RN, BC-ADM Inpatient Diabetes Coordinator Pager [phone] (8a-5p)

## 2020-03-13 NOTE — Progress Notes (Signed)
ANTICOAGULATION CONSULT NOTE -   Pharmacy Consult for apixaban Indication: atrial fibrillation  No Known Allergies  Patient Measurements: Height: 5\' 6"  (167.6 cm) Weight: 256 lb 6.3 oz (116.3 kg) IBW/kg (Calculated) : 59.3  Vital Signs: Temp: 97.6 F (36.4 C) (03/14 2116) Temp Source: Oral (03/14 2116) BP: 111/87 (03/14 2116) Pulse Rate: 92 (03/14 2116)  Labs: Recent Labs    03/11/20 0610 03/12/20 0633 03/12/20 1010 03/13/20 0452  HGB  --   --  13.7  --   HCT  --   --  47.4*  --   PLT  --   --  73*  --   LABPROT 32.3* 45.2*  --  54.9*  INR 3.1* 4.8*  --  6.2*  CREATININE 2.53* 2.50*  --  2.92*    Estimated Creatinine Clearance: 26.9 mL/min (A) (by C-G formula based on SCr of 2.92 mg/dL (H)).    Assessment: Pharmacy consulted to dose apixaban in patient with atrial fibrillation.  She was on warfarin prior to admission but the plan was to transition to Xarelto once INR trended down- patinet's renal function has worsened and new plan is to transition to apixaban.   INR 2.9>> 3.1>> 4.8>> 6.2 , trending up; MD ordering 5mg  IV Vit K today   Goal of Therapy:  Monitor platelets by anticoagulation protocol: Yes    Plan:  INR 6.2-->continue to hold apixaban Daily PT/INR Start apixaban 5 mg BID once INR is < 2. Monitor H&H and s/s of bleeding.  Isac Sarna, BS [place]ena [place], BCPS Clinical Pharmacist Pager [phone] 03/13/2020,8:12 AM

## 2020-03-13 NOTE — Progress Notes (Signed)
Rapid Response called due to patient not responding, heart rate in 40s to 50s, O2 Sat 70% on 2 lpm via [place].  O2 increased to 4lpm via [place]. Dr. Humphrey Rolls gave orders for chest xray x 1 view portable, BNP, ABG and a one time dose of Ativan 0.5mg  for anxiety.

## 2020-03-13 NOTE — Progress Notes (Signed)
Date and time results received: 03/13/20  At 0546  Test: Blood Gas  Critical Value: PCO2 19.2  Name of Provider Notified: Dr. Humphrey Rolls  Orders Received? Or Actions Taken?: awaiting orders

## 2020-03-13 NOTE — TOC Progression Note (Addendum)
Transition of Care [facility]) - Progression Note    Patient Details  Name: Tracy [name] MRN: [id number] Date of Birth: 09-10-1961  Transition [facility] Care [facility]) CM/SW Contact  Boneta Lucks, RN Phone Number: 03/13/2020, 1:07 PM  Clinical Narrative:   Everlene Balls health INS Auth good through 3/17 for [facility]. Navi reference number [id number]. Patient not medically ready. TOC to follow.     Expected Discharge Plan: [place] Barriers to Discharge: Continued Medical Work up  Expected Discharge Plan and Services Expected Discharge Plan: [place] In-house Referral: Clinical Social Work   Post Acute Care Choice: [place] Living arrangements for the past 2 months: Single Family Home                   Readmission Risk Interventions Readmission Risk Prevention Plan 03/12/2020 02/18/2020  Transportation Screening Complete Complete  Medication Review Press photographer) Complete Complete  PCP or Specialist appointment within 3-5 days of discharge - Not Complete  HRI or [place] - Complete  SW Recovery Care/Counseling Consult - Complete  Palliative Care Screening - Not Complete  Skilled [place] - Not Complete  Some recent data might be hidden

## 2020-03-13 NOTE — Progress Notes (Signed)
PT Cancellation Note  Patient Details Name: Tracy [name] MRN: [id number] DOB: 1961-02-23   Cancelled Treatment:    Reason Eval/Treat Not Completed: Medical issues which prohibited therapy.  Patient transferred to a higher level of care and will need new PT consult resume therapy when patient is medically stable.  Thank you.   8:58 AM, 03/13/20 Lonell Grandchild, MPT Physical Therapist with [facility] [phone] office [phone] mobile phone

## 2020-03-13 NOTE — Progress Notes (Signed)
PROGRESS NOTE  Tracy [name]:[id number] DOB: November 19, 1961 DOA: 03/07/2020 PCP: Rory Percy, MD  Brief History: 59 year old female with a history of chronic respiratory failure on 3L,diastolic CHF, COPD, tobacco abuse, diabetes mellitus type 2, hypertension, remote cocaine use presenting with 1 week history of worsening lower extremity edema, generalized weakness, and dyspnea on exertion. The patient has had decreased oral intake. She was noted to have some confusion with hypoglycemia at home. As a result, the patient was brought for further evaluation. Patient had recent admission to the hospital from 02/15/2020 to 02/21/2020 for acute on chronic diastolic CHF.Serum creatinine 2.25 on 02/21/20. Serum creatinine noted to be 2.63 on 02/28/20. She was discharged home on furosemide 40 mg twice daily. Discharge weight was 122.6 kg(270lb).She followed up with cardiology in office on 02/28/20 and she was noted to be fluid overloaded. Weight on 02/28/20 was 257.2. Even though her weight was down from time of d/c she was felt to be clinically fluid overloaded and instructed to take lasix 80 mg bid x 4 days, then back to usual 40 mg bid. Upon presentation, the patient was noted to be fluid overloaded. Chest x-ray showed pulmonary vascular congestion. She was started on IV furosemide60 mg bid initially and was transitioned to lasix 40 IV bid with slow but good clinical response.  In the early wm 03/13/20, rapid response was called due to pt somnolence and hypoxia.  She was moved to SDU  Assessment/Plan: Acute on chronic diastolic CHF/cor pulmonale -Patient remains clinically fluid overloaded -endorses dietary and fluid indiscretions -Continue IV furosemide>>>transition to po -3/14 weight = 256.1 lbs--NEG 10lbs -Accurate I's and O's -body habitus makes fluid status assessment difficult -appreciate cardiology  Acute metabolic encephalopathy -5/63/87 AM--per RN reports, pt  found somnolent and hypoxic -apparently then became agitated and given ativan at 0620 -3/15 ABG--7.319/19/137/ 14 (0.6) -CBG 54-->d50W-->no response (repeat CBG 107) -narcan x 1-->mild response with pt intermittently following commands short time -CT brain -blood cultures x 2 -repeat UA/culture -ammonia -placed on BiPAP -transfer to SDU -personally reviewed EKG--afib, RBBB -personally reviewed CXR--mild increased interstitial markings, better vs 03/07/20  Coagulopathy -INR 6.2 -vitamin K IV x 1 -likely due to cor pulmonale and chronic hepatic congestion  Hyperkalemia -give bicarbonate x 1 -Calcium gluconate -duoneb  COPD exacerbation -Continue Pulmicort -Continue duo nebs -continue IV Solu-Medrol>>>po prednisone -finished 5 days azithromycin  Sepsis -present on admission -secondary to UTI -wbc 17.8 with lactate 2.7 initially -continue ceftriaxone--had 6 days -sepsis physiology resolved  [place]coi UTI -UA>50 WBC -Continue ceftriaxone  D#7/7  Acute on Chronic respiratory failure with hypoxia -Patient is chronically on 3 L nasal cannula -placed on BiPAP 3/15 due to AMS with hypoxia, likely hypoventilation  CKD stage IV -Baseline creatinine 2.2-2.6 -will need to tolerate worsening renal function for improved respiratory status -appreciate nephrology consult  Thrombocytopenia -Has been chronic, likely secondary to hepatic congestion from the patient's cor pulmonale  Paroxysmal atrial fibrillation,with RVR -Restart metoprolol-->increased to50mg  bid -d/crivaroxaban due to worsen renal function -start apixabanonce INR subtherapeutic -transition to IV lopressor until able to take po  Diabetes mellitus type 2 -NovoLog sliding scale -Holding Jardiance and Amaryl -02/15/20 A1C--7.5  Hyponatremia -in part due to fluid overload -AM bmp  Tobacco abuse -Tobacco cessation discussed -nicoderm patch  Chronic leg pain -Restart home dose  hydrocodone -Venous duplex--neg for DVT  Morbid Obesity -BMI 42.17 -lifestyle modification       Disposition Plan: Patient From: Home D/C Place: SNF  Barriers: Not Clinically Stable--AMS, placed on BiPAP, transferred to SDU  Family Communication:Daughter updated 3/15   Consultants:none  Code Status: FULL   DVT Prophylaxis:pt continues to have supratherapeutic INR   Procedures: As Listed in Progress Note Above  Antibiotics: Ceftriaxone 3/9>> azithro 3/9>>>3/13    The patient is critically ill with multiple organ systems failure and requires high complexity decision making for assessment and support, frequent evaluation and titration of therapies, application of advanced monitoring technologies and extensive interpretation of multiple databases.  Critical care time - 45 mins.     Subjective: Pt somnolent, but woke up a little with narcan briefly.  ROS not possible due to encephalopathy  Objective: Vitals:   03/12/20 1421 03/12/20 2025 03/12/20 2116 03/13/20 0500  BP: 107/77  111/87   Pulse: (!) 47 (!) 107 92   Resp: 18 18 18    Temp:   97.6 F (36.4 C)   TempSrc:   Oral   SpO2: 95% 90% 97%   Weight:    116.3 kg  Height:        Intake/Output Summary (Last 24 hours) at 03/13/2020 0819 Last data filed at 03/13/2020 0540 Gross per 24 hour  Intake 600 ml  Output 900 ml  Net -300 ml   Weight change: 0.1 kg Exam:   General:  Pt is alert, follows commands appropriately, not in acute distress  HEENT: No icterus, No thrush, No neck mass, [place]/AT  Cardiovascular: IRRR, S1/S2, no rubs, no gallops  Respiratory: bibasilar rales, no wheeze  Abdomen: Soft/+BS, non tender, non distended, no guarding  Extremities: 1+ LE edema, No lymphangitis, No petechiae, No rashes, no synovitis   Data Reviewed: I have personally reviewed following labs and imaging studies Basic Metabolic Panel: Recent Labs  Lab 03/09/20 0447 03/10/20 0450  03/11/20 0610 03/12/20 0633 03/13/20 0452  NA 130* 133* 133* 133* 134*  K 4.5 4.2 3.9 4.8 5.3*  CL 95* 97* 94* 96* 96*  CO2 18* 19* 22 20* 14*  GLUCOSE 203* 207* 147* 74 97  BUN 71* 83* 86* 89* 97*  CREATININE 2.81* 2.73* 2.53* 2.50* 2.92*  CALCIUM 8.7* 8.6* 8.7* 9.0 9.0   Liver Function Tests: Recent Labs  Lab 03/07/20 1107 03/08/20 0548 03/10/20 0450 03/13/20 0452  AST 66* 44* 22 33  ALT 123* 95* 71* 56*  ALKPHOS 315* 295* 286* 278*  BILITOT 7.4* 6.2* 5.1* 9.2*  PROT 5.7* 5.4* 5.4* 5.3*  ALBUMIN 2.9* 2.5* 2.5* 2.5*   No results for input(s): LIPASE, AMYLASE in the last 168 hours. No results for input(s): AMMONIA in the last 168 hours. Coagulation Profile: Recent Labs  Lab 03/09/20 1040 03/10/20 1437 03/11/20 0610 03/12/20 0633 03/13/20 0452  INR 3.0* 2.9* 3.1* 4.8* 6.2*   CBC: Recent Labs  Lab 03/07/20 1107 03/08/20 0548 03/12/20 1010  WBC 17.8* 13.1* 10.5  NEUTROABS 16.1* 12.0*  --   HGB 12.6 12.3 13.7  HCT 43.0 42.3 47.4*  MCV 94.9 94.0 94.6  PLT 82* 75* 73*   Cardiac Enzymes: No results for input(s): CKTOTAL, CKMB, CKMBINDEX, TROPONINI in the last 168 hours. BNP: Invalid input(s): POCBNP CBG: Recent Labs  Lab 03/12/20 1117 03/12/20 1625 03/12/20 2111 03/13/20 0515 03/13/20 0733  GLUCAP 121* 133* 160* 92 54*   HbA1C: No results for input(s): HGBA1C in the last 72 hours. Urine analysis:    Component Value Date/Time   COLORURINE AMBER (A) 03/07/2020 1107   APPEARANCEUR CLOUDY (A) 03/07/2020 1107   LABSPEC 1.013 03/07/2020 1107   PHURINE  5.0 03/07/2020 1107   GLUCOSEU 50 (A) 03/07/2020 1107   HGBUR LARGE (A) 03/07/2020 1107   BILIRUBINUR NEGATIVE 03/07/2020 1107   KETONESUR NEGATIVE 03/07/2020 1107   PROTEINUR 30 (A) 03/07/2020 1107   UROBILINOGEN 0.2 08/03/2013 2205   NITRITE NEGATIVE 03/07/2020 1107   LEUKOCYTESUR LARGE (A) 03/07/2020 1107   Sepsis Labs: @LABRCNTIP (procalcitonin:4,lacticidven:4) ) Recent Results (from the past  240 hour(s))  Urine Culture     Status: Abnormal   Collection Time: 03/07/20 11:07 AM   Specimen: Urine, Random  Result Value Ref Range Status   Specimen Description   Final    URINE, RANDOM Performed at [facility], [address] [place], [place] [address]    Special Requests   Final    NONE Performed at [facility], [address] [place], [place] [address]    Culture >=100,000 COLONIES/mL ESCHERICHIA COLI (A)  Final   Report Status 03/09/2020 FINAL  Final   Organism ID, Bacteria ESCHERICHIA COLI (A)  Final      Susceptibility   Escherichia coli - MIC*    AMPICILLIN >=32 RESISTANT Resistant     CEFAZOLIN <=4 SENSITIVE Sensitive     CEFTRIAXONE <=0.25 SENSITIVE Sensitive     CIPROFLOXACIN <=0.25 SENSITIVE Sensitive     GENTAMICIN <=1 SENSITIVE Sensitive     IMIPENEM <=0.25 SENSITIVE Sensitive     NITROFURANTOIN <=16 SENSITIVE Sensitive     TRIMETH/SULFA <=20 SENSITIVE Sensitive     AMPICILLIN/SULBACTAM >=32 RESISTANT Resistant     PIP/TAZO <=4 SENSITIVE Sensitive     * >=100,000 COLONIES/mL ESCHERICHIA COLI  Respiratory Panel by RT PCR (Flu A&B, Covid) - Nasopharyngeal Swab     Status: None   Collection Time: 03/07/20 11:53 AM   Specimen: Nasopharyngeal Swab  Result Value Ref Range Status   SARS Coronavirus 2 by RT PCR NEGATIVE NEGATIVE Final    Comment: (NOTE) SARS-CoV-2 target nucleic acids are NOT DETECTED. The SARS-CoV-2 RNA is generally detectable in upper respiratoy specimens during the acute phase of infection. The lowest concentration of SARS-CoV-2 viral copies this assay can detect is 131 copies/mL. A negative result does not preclude SARS-Cov-2 infection and should not be used as the sole basis for treatment or other patient management decisions. A negative result may occur with  improper specimen collection/handling, submission of specimen other than nasopharyngeal swab, presence of viral mutation(s) within the areas targeted by this assay, and inadequate  number of viral copies (<131 copies/mL). A negative result must be combined with clinical observations, patient history, and epidemiological information. The expected result is Negative. Fact Sheet for Patients:  PinkCheek.be Fact Sheet for Healthcare Providers:  GravelBags.it This test is not yet ap proved or cleared by the [place] FDA and  has been authorized for detection and/or diagnosis of SARS-CoV-2 by FDA under an Emergency Use Authorization (EUA). This EUA will remain  in effect (meaning this test can be used) for the duration of the COVID-19 declaration under Section 564(b)(1) of the Act, 21 U.S.C. section 360bbb-3(b)(1), unless the authorization is terminated or revoked sooner.    Influenza A by PCR NEGATIVE NEGATIVE Final   Influenza B by PCR NEGATIVE NEGATIVE Final    Comment: (NOTE) The Xpert Xpress SARS-CoV-2/FLU/RSV assay is intended as an aid in  the diagnosis of influenza from Nasopharyngeal swab specimens and  should not be used as a sole basis for treatment. Nasal washings and  aspirates are unacceptable for Xpert Xpress SARS-CoV-2/FLU/RSV  testing. Fact Sheet for Patients: PinkCheek.be Fact Sheet  for Healthcare Providers: GravelBags.it This test is not yet approved or cleared by the [place] and  has been authorized for detection and/or diagnosis of SARS-CoV-2 by  FDA under an Emergency Use Authorization (EUA). This EUA will remain  in effect (meaning this test can be used) for the duration of the  Covid-19 declaration under Section 564(b)(1) of the Act, 21  U.S.C. section 360bbb-3(b)(1), unless the authorization is  terminated or revoked. Performed at [facility], [address], [place], [place] [address]      Scheduled Meds: . budesonide (PULMICORT) nebulizer solution  0.25 mg Nebulization BID  . Chlorhexidine Gluconate Cloth   6 each Topical Daily  . Chlorhexidine Gluconate Cloth  6 each Topical Q0600  . dextrose      . insulin aspart  0-9 Units Subcutaneous TID WC  . ipratropium-albuterol  3 mL Nebulization TID  . metoprolol tartrate  50 mg Oral BID  . naloxone      . naLOXone (NARCAN)  injection  0.4 mg Intravenous Once  . nicotine  21 mg Transdermal Daily  . sodium bicarbonate  50 mEq Intravenous Once  . sodium chloride flush  10-40 mL Intracatheter Q12H  . sodium chloride flush  3 mL Intravenous Q12H   Continuous Infusions: . sodium chloride    . calcium gluconate    . cefTRIAXone (ROCEPHIN)  IV 1 g (03/12/20 1635)  . phytonadione (VITAMIN K) IV      Procedures/Studies: US Venous Img Lower Bilateral (DVT)  Result Date: 03/08/2020 CLINICAL DATA:  Chronic bilateral lower extremity edema, worsened during the past week. History of previous right lower extremity DVT (femoral and popliteal veins). History of smoking. Evaluate for DVT. EXAM: BILATERAL LOWER EXTREMITY VENOUS DOPPLER ULTRASOUND TECHNIQUE: Gray-scale sonography with graded compression, as well as color Doppler and duplex ultrasound were performed to evaluate the lower extremity deep venous systems from the level of the common femoral vein and including the common femoral, femoral, profunda femoral, popliteal and calf veins including the posterior tibial, peroneal and gastrocnemius veins when visible. The superficial great saphenous vein was also interrogated. Spectral Doppler was utilized to evaluate flow at rest and with distal augmentation maneuvers in the common femoral, femoral and popliteal veins. COMPARISON:  Bilateral lower extremity venous Doppler ultrasound-09/22/2017 (negative). FINDINGS: Examination is degraded due to patient body habitus and poor sonographic window RIGHT LOWER EXTREMITY Common Femoral Vein: No evidence of thrombus. Normal compressibility, respiratory phasicity and response to augmentation. Saphenofemoral Junction: No evidence  of thrombus. Normal compressibility and flow on color Doppler imaging. Profunda Femoral Vein: No evidence of thrombus. Normal compressibility and flow on color Doppler imaging. Femoral Vein: Appears patent where imaged. Popliteal Vein: No evidence of acute or chronic thrombus. Normal compressibility, respiratory phasicity and response to augmentation. Calf Veins: Appear patent where imaged. Superficial Great Saphenous Vein: No evidence of thrombus. Normal compressibility. Venous Reflux:  None. Other Findings:  None. LEFT LOWER EXTREMITY Common Femoral Vein: No evidence of thrombus. Normal compressibility, respiratory phasicity and response to augmentation. Saphenofemoral Junction: No evidence of thrombus. Normal compressibility and flow on color Doppler imaging. Profunda Femoral Vein: No evidence of thrombus. Normal compressibility and flow on color Doppler imaging. Femoral Vein: Appears patent where imaged. Popliteal Vein: No evidence of thrombus. Normal compressibility, respiratory phasicity and response to augmentation. Calf Veins: Appear patent where imaged. Superficial Great Saphenous Vein: No evidence of thrombus. Normal compressibility. Venous Reflux:  None. Other Findings:  None. IMPRESSION: No evidence of acute or chronic DVT within  either lower extremity on this body habitus degraded examination. Electronically Signed   By: Sandi Mariscal M.D.   On: 03/08/2020 16:21   DG Chest Port 1 View  Result Date: 03/13/2020 CLINICAL DATA:  Dyspnea, chronic respiratory failure, worsening lower extremity edema, generalized weakness and dyspnea on exertion EXAM: PORTABLE CHEST 1 VIEW COMPARISON:  Radiograph 03/07/2020, CT 03/21/2015 FINDINGS: There is cardiomegaly with cephalized vascularity. Tortuosity of the brachiocephalic vessels along the right upper mediastinum is similar to priors. Few bandlike opacities in the mid lungs may reflect areas of subsegmental atelectasis. No consolidative opacity, pneumothorax or  effusion is seen. No acute osseous or soft tissue abnormality. IMPRESSION: Cardiomegaly and vascular congestion may reflect features of heart failure. Bandlike opacities, likely subsegmental atelectasis. Electronically Signed   By: Lovena Le M.D.   On: 03/13/2020 05:52   DG Chest Port 1 View  Result Date: 03/07/2020 CLINICAL DATA:  Shortness of breath EXAM: PORTABLE CHEST 1 VIEW COMPARISON:  02/17/2020 FINDINGS: Stable cardiomegaly. Calcific aortic knob. Mild vascular congestion and subtly increased interstitial markings bilaterally. Linear atelectasis within the peripheral aspect of the left mid lung. The visualized skeletal structures are unremarkable. IMPRESSION: Cardiomegaly with mild pulmonary vascular congestion and subtly increased interstitial markings suggesting mild edema. Electronically Signed   By: Davina Poke D.O.   On: 03/07/2020 12:29   DG Chest Port 1 View  Result Date: 02/17/2020 CLINICAL DATA:  Shortness of breath EXAM: PORTABLE CHEST 1 VIEW COMPARISON:  February 14, 2020 FINDINGS: There is scarring in the right base region. There is mild atelectasis in the left base. There is stable cardiomegaly with pulmonary venous hypertension. No adenopathy. There is aortic atherosclerosis. No bone lesions. IMPRESSION: Cardiomegaly with pulmonary vascular congestion. Mild scarring right base. Mild atelectasis left base. No appreciable pulmonary edema. Aortic Atherosclerosis (ICD10-I70.0). Electronically Signed   By: Lowella Grip III M.D.   On: 02/17/2020 07:46   DG Chest Port 1 View  Result Date: 02/14/2020 CLINICAL DATA:  Shortness of breath and fluid retention. EXAM: PORTABLE CHEST 1 VIEW COMPARISON:  PA and lateral chest 02/08/2020 and 05/20/2019. FINDINGS: There is marked cardiomegaly and pulmonary vascular congestion. No consolidative process, pneumothorax or effusion. Atherosclerosis is noted. No acute or focal bony abnormality. IMPRESSION: Cardiomegaly and vascular congestion.  Atherosclerosis. Electronically Signed   By: Inge Rise M.D.   On: 02/14/2020 17:45   ECHOCARDIOGRAM COMPLETE  Result Date: 02/16/2020    ECHOCARDIOGRAM REPORT   Patient Name:   AKAISHA TRUMAN Date of Exam: 02/16/2020 Medical Rec #:  [id number]             Height:       66.0 in Accession #:    [id number]            Weight:       266.1 lb Date of Birth:  03/01/1961            BSA:          2.26 m Patient Age:    59 years              BP:           126/89 mmHg Patient Gender: F                     HR:           61 bpm. Exam Location:  [facility] Procedure: 2D Echo Indications:    Dyspnea 786.09 / R06.00  History:  Patient has prior history of Echocardiogram examinations, most                 recent 09/22/2017. CHF, Stroke and COPD; Risk Factors:Current                 Smoker, Diabetes and Hypertension. Crack cocaine use, RBBB,                 Acute renal failure , LVH , Acute respiratory failure with                 hypoxia.  Sonographer:    Leavy Cella RDCS (AE) Referring Phys: [id number] COURAGE EMOKPAE IMPRESSIONS  1. Left ventricular ejection fraction, by estimation, is 55 to 60%. The left ventricle has normal function. The left ventricle has no regional wall motion abnormalities. There is mild concentric left ventricular hypertrophy. Left ventricular diastolic parameters are indeterminate. Elevated left ventricular end-diastolic pressure.  2. Right ventricular systolic function is severely reduced. The right ventricular size is moderately enlarged. mildly increased right ventricular wall thickness. There is moderately elevated pulmonary artery systolic pressure.  3. Left atrial size was severely dilated.  4. Right atrial size was severely dilated.  5. The mitral valve is grossly normal. Mild mitral valve regurgitation.  6. Tricuspid valve regurgitation is moderate.  7. The aortic valve is tricuspid. Aortic valve regurgitation is not visualized. No aortic stenosis is present.  8. The inferior  vena cava is dilated in size with >50% respiratory variability, suggesting right atrial pressure of 8 mmHg. FINDINGS  Left Ventricle: Left ventricular ejection fraction, by estimation, is 55 to 60%. The left ventricle has normal function. The left ventricle has no regional wall motion abnormalities. The left ventricular internal cavity size was normal in size. There is  mild concentric left ventricular hypertrophy. Left ventricular diastolic parameters are indeterminate. Elevated left ventricular end-diastolic pressure. Right Ventricle: The right ventricular size is moderately enlarged. Mildly increased right ventricular wall thickness. Right ventricular systolic function is severely reduced. There is moderately elevated pulmonary artery systolic pressure. The tricuspid  regurgitant velocity is 3.12 m/s, and with an assumed right atrial pressure of 10 mmHg, the estimated right ventricular systolic pressure is 27.0 mmHg. Left Atrium: Left atrial size was severely dilated. Right Atrium: Right atrial size was severely dilated. Pericardium: There is no evidence of pericardial effusion. Mitral Valve: The mitral valve is grossly normal. Mild mitral annular calcification. Mild mitral valve regurgitation. Tricuspid Valve: The tricuspid valve is grossly normal. Tricuspid valve regurgitation is moderate. Aortic Valve: The aortic valve is tricuspid. Aortic valve regurgitation is not visualized. No aortic stenosis is present. Pulmonic Valve: The pulmonic valve was grossly normal. Pulmonic valve regurgitation is not visualized. Aorta: The aortic root is normal in size and structure. Venous: The inferior vena cava is dilated in size with greater than 50% respiratory variability, suggesting right atrial pressure of 8 mmHg. IAS/Shunts: No atrial level shunt detected by color flow Doppler.  LEFT VENTRICLE PLAX 2D LVIDd:         4.54 cm  Diastology LVIDs:         2.77 cm  LV e' lateral:   11.20 cm/s LV PW:         1.40 cm  LV E/e'  lateral: 7.8 LV IVS:        1.16 cm  LV e' medial:    4.24 cm/s LVOT diam:     2.10 cm  LV E/e' medial:  20.7 LV SV Index:  26.96 LVOT Area:     3.46 cm  RIGHT VENTRICLE RV S prime:     8.05 cm/s LEFT ATRIUM              Index       RIGHT ATRIUM           Index LA diam:        4.40 cm  1.95 cm/m  RA Area:     41.10 cm LA Vol (A2C):   143.0 ml 63.34 ml/m RA Volume:   176.00 ml 77.95 ml/m LA Vol (A4C):   99.1 ml  43.89 ml/m LA Biplane Vol: 121.0 ml 53.59 ml/m   AORTA Ao Root diam: 3.40 cm MITRAL VALVE               TRICUSPID VALVE MV Area (PHT): 4.06 cm    TR Peak grad:   38.9 mmHg MV Decel Time: 187 msec    TR Vmax:        312.00 cm/s MR Peak grad: 62.1 mmHg MR Vmax:      394.00 cm/s  SHUNTS MV E velocity: 87.80 cm/s  Systemic Diam: 2.10 cm MV A velocity: 47.50 cm/s MV E/A ratio:  1.85 Kate Sable MD Electronically signed by Kate Sable MD Signature Date/Time: 02/16/2020/4:32:20 PM    Final    US Abdomen Limited RUQ  Result Date: 03/07/2020 CLINICAL DATA:  Right upper quadrant pain and elevated LFTs EXAM: ULTRASOUND ABDOMEN LIMITED RIGHT UPPER QUADRANT COMPARISON:  02/16/2020 FINDINGS: Gallbladder: No gallstones or wall thickening visualized. No sonographic Murphy sign noted by sonographer. Common bile duct: Diameter: 4 mm Liver: No focal lesion identified. Within normal limits in parenchymal echogenicity. Portal vein is patent on color Doppler imaging with normal direction of blood flow towards the liver. Other: None. IMPRESSION: No acute abnormality noted. Electronically Signed   By: Inez Catalina M.D.   On: 03/07/2020 15:05   US Abdomen Limited RUQ  Result Date: 02/16/2020 CLINICAL DATA:  Bilirubinemia EXAM: ULTRASOUND ABDOMEN LIMITED RIGHT UPPER QUADRANT COMPARISON:  None. FINDINGS: Gallbladder: No gallstones or wall thickening visualized. No sonographic Murphy sign noted by sonographer. Common bile duct: Diameter: 3 mm Liver: No focal lesion identified. Within normal limits in  parenchymal echogenicity. Portal vein is patent on color Doppler imaging with normal direction of blood flow towards the liver. IMPRESSION: Negative right upper quadrant ultrasound. Electronically Signed   By: Monte Fantasia M.D.   On: 02/16/2020 10:40    Orson Eva, DO  [facility]  If 7PM-7AM, please contact night-coverage [email or web address] Password [facility] 03/13/2020, 8:19 AM   LOS: 6 days

## 2020-03-13 NOTE — Progress Notes (Signed)
Patient ID: Tracy [name], female   DOB: 07-Feb-1961, 59 y.o.   MRN: [id number] S: Pt transferred to ICU early this am due to somnolence and hypoxia.  Currently wearing bipap but is confused and noncommunicative. O:BP 114/89   Pulse (!) 125   Temp 98.1 F (36.7 C) (Axillary)   Resp (!) 32   Ht 5\' 6"  (1.676 m)   Wt 116.3 kg   SpO2 100%   BMI 41.38 kg/m   Intake/Output Summary (Last 24 hours) at 03/13/2020 0926 Last data filed at 03/13/2020 0540 Gross per 24 hour  Intake 360 ml  Output 900 ml  Net -540 ml   Intake/Output: I/O last 3 completed shifts: In: 600 [P.O.:600] Out: 1720 [Urine:1720]  Intake/Output this shift:  No intake/output data recorded. Weight change: 0.1 kg Gen: obese female wearing bipap CVS:irr irr and tachycardia with HR in 110's-120's Resp: decreased BS bilaterally Abd: obese, +BS Ext: +1 pretibial edema  Recent Labs  Lab 03/07/20 1107 03/08/20 0548 03/09/20 0447 03/10/20 0450 03/11/20 0610 03/12/20 0633 03/13/20 0452  NA 138 137 130* 133* 133* 133* 134*  K 3.5 4.8 4.5 4.2 3.9 4.8 5.3*  CL 100 100 95* 97* 94* 96* 96*  CO2 24 20* 18* 19* 22 20* 14*  GLUCOSE 98 141* 203* 207* 147* 74 97  BUN 67* 66* 71* 83* 86* 89* 97*  CREATININE 2.63* 2.34* 2.81* 2.73* 2.53* 2.50* 2.92*  ALBUMIN 2.9* 2.5*  --  2.5*  --   --  2.5*  CALCIUM 8.2* 8.7* 8.7* 8.6* 8.7* 9.0 9.0  AST 66* 44*  --  22  --   --  33  ALT 123* 95*  --  71*  --   --  56*   Liver Function Tests: Recent Labs  Lab 03/08/20 0548 03/10/20 0450 03/13/20 0452  AST 44* 22 33  ALT 95* 71* 56*  ALKPHOS 295* 286* 278*  BILITOT 6.2* 5.1* 9.2*  PROT 5.4* 5.4* 5.3*  ALBUMIN 2.5* 2.5* 2.5*   No results for input(s): LIPASE, AMYLASE in the last 168 hours. No results for input(s): AMMONIA in the last 168 hours. CBC: Recent Labs  Lab 03/07/20 1107 03/08/20 0548 03/12/20 1010  WBC 17.8* 13.1* 10.5  NEUTROABS 16.1* 12.0*  --   HGB 12.6 12.3 13.7  HCT 43.0 42.3 47.4*  MCV 94.9 94.0  94.6  PLT 82* 75* 73*   Cardiac Enzymes: No results for input(s): CKTOTAL, CKMB, CKMBINDEX, TROPONINI in the last 168 hours. CBG: Recent Labs  Lab 03/12/20 1625 03/12/20 2111 03/13/20 0515 03/13/20 0733 03/13/20 0837  GLUCAP 133* 160* 92 54* 107*    Iron Studies: No results for input(s): IRON, TIBC, TRANSFERRIN, FERRITIN in the last 72 hours. Studies/Results: DG Chest Port 1 View  Result Date: 03/13/2020 CLINICAL DATA:  Dyspnea, chronic respiratory failure, worsening lower extremity edema, generalized weakness and dyspnea on exertion EXAM: PORTABLE CHEST 1 VIEW COMPARISON:  Radiograph 03/07/2020, CT 03/21/2015 FINDINGS: There is cardiomegaly with cephalized vascularity. Tortuosity of the brachiocephalic vessels along the right upper mediastinum is similar to priors. Few bandlike opacities in the mid lungs may reflect areas of subsegmental atelectasis. No consolidative opacity, pneumothorax or effusion is seen. No acute osseous or soft tissue abnormality. IMPRESSION: Cardiomegaly and vascular congestion may reflect features of heart failure. Bandlike opacities, likely subsegmental atelectasis. Electronically Signed   By: Lovena Le M.D.   On: 03/13/2020 05:52   . budesonide (PULMICORT) nebulizer solution  0.25 mg Nebulization BID  . Chlorhexidine Gluconate  Cloth  6 each Topical Daily  . Chlorhexidine Gluconate Cloth  6 each Topical Q0600  . insulin aspart  0-9 Units Subcutaneous TID WC  . ipratropium-albuterol  3 mL Nebulization TID  . metoprolol tartrate  50 mg Oral BID  . naLOXone (NARCAN)  injection  0.4 mg Intravenous Once  . nicotine  21 mg Transdermal Daily  . sodium bicarbonate  50 mEq Intravenous Once  . sodium chloride flush  10-40 mL Intracatheter Q12H  . sodium chloride flush  3 mL Intravenous Q12H    BMET    Component Value Date/Time   NA 134 (L) 03/13/2020 0452   K 5.3 (H) 03/13/2020 0452   CL 96 (L) 03/13/2020 0452   CO2 14 (L) 03/13/2020 0452   GLUCOSE 97  03/13/2020 0452   BUN 97 (H) 03/13/2020 0452   CREATININE 2.92 (H) 03/13/2020 0452   CALCIUM 9.0 03/13/2020 0452   GFRNONAA 17 (L) 03/13/2020 0452   GFRAA 20 (L) 03/13/2020 0452   CBC    Component Value Date/Time   WBC 10.5 03/12/2020 1010   RBC 5.01 03/12/2020 1010   HGB 13.7 03/12/2020 1010   HCT 47.4 (H) 03/12/2020 1010   PLT 73 (L) 03/12/2020 1010   MCV 94.6 03/12/2020 1010   MCH 27.3 03/12/2020 1010   MCHC 28.9 (L) 03/12/2020 1010   RDW 24.6 (H) 03/12/2020 1010   LYMPHSABS 0.8 03/08/2020 0548   MONOABS 0.2 03/08/2020 0548   EOSABS 0.0 03/08/2020 0548   BASOSABS 0.0 03/08/2020 0548     Assessment/Plan:  1. AKI/CKD stage 3b-4: in setting of acute on chronic CHF and likely cardiorenal syndrome.  Diuresing but SCr up from 2.5 over the weekend.  Likely worsened with A fib and RVR and drops in her BP.  Cont with IV lasix and need better rate control.  No indication for HD at this time.  2. Acute on chronic respiratory failure- now on BiPap and in SDU. 3. A fib with RVR- HR in the 120's and drops in BP noted.  Will need better rate control.  Possible dilt drip or amiodarone/Cardiology consult. 4. Acute on chronic diastolic CHF/Cor Pulmonale- diuresing with IV lasix. 5. COPD- now wearing BiPap 6. Sepsis due to E Coli UTI- on ceftriaxone.  7. Coagulopathy- elevated INR treated with vitamin K. Presumably due to chronic hepatic congestion from cor pulmonale/right heart failure.  8. Thrombocytopenia- chronic. 9. DM type 2- per primary svc 10. Hyperkalemia- mild.  Will treat with lokelma if able to take po otherwise follow with IV lasix. 11. Hyponatremia- due to CHF and acute respiratory distress.  Improving.   Donetta Potts, MD [employer] [phone]

## 2020-03-13 NOTE — Progress Notes (Signed)
CRITICAL VALUE ALERT  Critical Value:  INR 6.2  Date & Time Notied:  03/13/2020 @ 0600  Provider Notified: Dr. Humphrey Rolls  Orders Received/Actions taken: awaiting orders.

## 2020-03-14 LAB — COMPREHENSIVE METABOLIC PANEL
ALT: 61 U/L — ABNORMAL HIGH (ref 0–44)
AST: 44 U/L — ABNORMAL HIGH (ref 15–41)
Albumin: 2.4 g/dL — ABNORMAL LOW (ref 3.5–5.0)
Alkaline Phosphatase: 220 U/L — ABNORMAL HIGH (ref 38–126)
Anion gap: 18 — ABNORMAL HIGH (ref 5–15)
BUN: 112 mg/dL — ABNORMAL HIGH (ref 6–20)
CO2: 21 mmol/L — ABNORMAL LOW (ref 22–32)
Calcium: 8.5 mg/dL — ABNORMAL LOW (ref 8.9–10.3)
Chloride: 99 mmol/L (ref 98–111)
Creatinine, Ser: 2.73 mg/dL — ABNORMAL HIGH (ref 0.44–1.00)
GFR calc Af Amer: 21 mL/min — ABNORMAL LOW (ref >60)
GFR calc non Af Amer: 18 mL/min — ABNORMAL LOW (ref >60)
Glucose, Bld: 120 mg/dL — ABNORMAL HIGH (ref 70–99)
Potassium: 4.6 mmol/L (ref 3.5–5.1)
Sodium: 138 mmol/L (ref 135–145)
Total Bilirubin: 10.6 mg/dL — ABNORMAL HIGH (ref 0.3–1.2)
Total Protein: 4.8 g/dL — ABNORMAL LOW (ref 6.5–8.1)

## 2020-03-14 LAB — GLUCOSE, CAPILLARY
Glucose-Capillary: 113 mg/dL — ABNORMAL HIGH (ref 70–99)
Glucose-Capillary: 112 mg/dL — ABNORMAL HIGH (ref 70–99)
Glucose-Capillary: 131 mg/dL — ABNORMAL HIGH (ref 70–99)
Glucose-Capillary: 111 mg/dL — ABNORMAL HIGH (ref 70–99)

## 2020-03-14 LAB — URINE CULTURE: Culture: NO GROWTH

## 2020-03-14 LAB — RENAL FUNCTION PANEL
Albumin: 2.4 g/dL — ABNORMAL LOW (ref 3.5–5.0)
Anion gap: 18 — ABNORMAL HIGH (ref 5–15)
BUN: 112 mg/dL — ABNORMAL HIGH (ref 6–20)
CO2: 21 mmol/L — ABNORMAL LOW (ref 22–32)
Calcium: 8.6 mg/dL — ABNORMAL LOW (ref 8.9–10.3)
Chloride: 99 mmol/L (ref 98–111)
Creatinine, Ser: 2.73 mg/dL — ABNORMAL HIGH (ref 0.44–1.00)
GFR calc Af Amer: 21 mL/min — ABNORMAL LOW (ref >60)
GFR calc non Af Amer: 18 mL/min — ABNORMAL LOW (ref >60)
Glucose, Bld: 120 mg/dL — ABNORMAL HIGH (ref 70–99)
Phosphorus: 6.2 mg/dL — ABNORMAL HIGH (ref 2.5–4.6)
Potassium: 4.7 mmol/L (ref 3.5–5.1)
Sodium: 138 mmol/L (ref 135–145)

## 2020-03-14 LAB — PROTIME-INR
INR: 2.3 — ABNORMAL HIGH (ref 0.8–1.2)
Prothrombin Time: 25 seconds — ABNORMAL HIGH (ref 11.4–15.2)

## 2020-03-14 MED ORDER — DILTIAZEM HCL 30 MG PO TABS
30.0000 mg | ORAL_TABLET | Freq: Four times a day (QID) | ORAL | Status: DC
  Administered 2020-03-14 – 2020-03-17 (×11): 30 mg via ORAL
  Filled 2020-03-14 (×12): qty 1

## 2020-03-14 MED ORDER — FUROSEMIDE 10 MG/ML IJ SOLN
80.0000 mg | Freq: Once | INTRAMUSCULAR | Status: AC
  Administered 2020-03-14: 80 mg via INTRAVENOUS
  Filled 2020-03-14: qty 8

## 2020-03-14 MED ORDER — DILTIAZEM HCL-DEXTROSE 125-5 MG/125ML-% IV SOLN (PREMIX)
5.0000 mg/h | INTRAVENOUS | Status: DC
  Administered 2020-03-14: 5 mg/h via INTRAVENOUS
  Filled 2020-03-14: qty 125

## 2020-03-14 NOTE — Progress Notes (Signed)
Pt's femoral line dressing was changed during night shift. R thigh has 3+ edema to lateral thigh. Noted to have yellow drainage from femoral site and biopatch is soaked in yellow fluid. All three lines flush and pull back blood. MD Tat notified of this, will consult MD Bridges as pt has no other access and line has been in for 6 days.

## 2020-03-14 NOTE — Progress Notes (Signed)
R side femoral line removed by MD Constance Haw, this RN held pressure for approx 30 minutes. Site did not reveal hematoma and bandage applied. No signs of active bleeding.

## 2020-03-14 NOTE — Progress Notes (Signed)
PROGRESS NOTE  Tracy [name] FVC:[id number] DOB: 09/25/1961 DOA: 03/07/2020 PCP: Rory Percy, MD  Brief History: 59 year old female with a history of chronic respiratory failure on 3L,diastolic CHF, COPD, tobacco abuse, diabetes mellitus type 2, hypertension, remote cocaine use presenting with 1 week history of worsening lower extremity edema, generalized weakness, and dyspnea on exertion. The patient has had decreased oral intake. She was noted to have some confusion with hypoglycemia at home. As a result, the patient was brought for further evaluation. Patient had recent admission to the hospital from 02/15/2020 to 02/21/2020 for acute on chronic diastolic CHF.Serum creatinine 2.25 on 02/21/20. Serum creatinine noted to be 2.63 on 02/28/20. She was discharged home on furosemide 40 mg twice daily. Discharge weight was 122.6 kg(270lb).She followed up with cardiology in office on 02/28/20 and she was noted to be fluid overloaded. Weight on 02/28/20 was 257.2. Even though her weight was down from time of d/c she was felt to be clinically fluid overloaded and instructed to take lasix 80 mg bid x 4 days, then back to usual 40 mg bid. Upon presentation, the patient was noted to be fluid overloaded. Chest x-ray showed pulmonary vascular congestion. She was started on IV furosemide60 mg bid initially and was transitioned to lasix 40 IV bid with slow but good clinical response.  In the early wm 03/13/20, rapid response was called due to pt somnolence and hypoxia.  She was moved to SDU  Assessment/Plan: Acute on chronic diastolic CHF/cor pulmonale -Patient remains clinically fluid overloaded -endorses dietary and fluid indiscretions -Continue IV furosemide>>>transition to po when ok with renal -3/16 weight = 254.8lbs--NEG 12lbs -Accurate I's and O's -body habitus makes fluid status assessment difficult -appreciate cardiology/nephrology  Acute metabolic  encephalopathy -03/13/20 AM--per RN reports, pt found somnolent and hypoxic -apparently then became agitated and given ativan at 0620 -3/15 ABG--7.319/19/137/ 14 (0.6) -CBG 54-->d50W-->no response (repeat CBG 107) -narcan x 1-->mild response with pt intermittently following commands short time -CT brain--neg -blood cultures x 2 -repeat UA--no pyuria -ammonia--26 -placed on BiPAP>>>>now off last 24 hours, stable on 3L -3/15--transfer to SDU -03/14/20--more awake and alert, near baseline -likely due to opioids/hynotics-->d/c  Coagulopathy -INR 6.2 -vitamin K IV x 1>>>INR 2.3 -likely due to cor pulmonale and chronic hepatic congestion -repeat INR am  Bacteremia -3/15 Blood culture--GPC one of 2 sets -likely contaminant -03/14/20 I have requested general surgery to change femoral central line to opposite side (today is D#6)  CKD stage IV -Baseline creatinine 2.2-2.6 -will need to tolerate worsening renal function for improved respiratory status -appreciate nephrology consult--discussed with Dr. Royce Macadamia 3/16-->lasix held 3/15, redose IV 80 mg on 3/16  Hyperkalemia -give bicarbonate x 1 -Calcium gluconate -duoneb -improved  COPD exacerbation -Continue Pulmicort -Continue duo nebs -continue IV Solu-Medrol>>>po prednisone>>>stopped -finished 5 daysazithromycin  Sepsis -present on admission -secondary to UTI -wbc 17.8 with lactate 2.7 initially -finished course of ceftriaxone -sepsis physiology resolved  [place] UTI -UA>50 WBC -finished 6 days ceftriaxone  Acute on Chronic respiratory failure with hypoxia -Patient is chronically on 3 L nasal cannula -placed on BiPAP 3/15 due to AMS with hypoxia, likely hypoventilation -3/166--now stable on 3L  Thrombocytopenia -Has been chronic, likely secondary to hepatic congestion from the patient's cor pulmonale  Paroxysmal atrial fibrillation,with RVR -Restart metoprolol-->increased to50mg  bid -d/crivaroxaban due to  worsen renal function -start apixabanonce INR subtherapeutic -transitioned to IV lopressor until able to take po -IV diltiazem added 3/15 evening-->transition to po  Diabetes  mellitus type 2 -NovoLog sliding scale -Holding Jardiance and Amaryl -02/15/20 A1C--7.5  Hyponatremia -in part due to fluid overload -AM bmp  Tobacco abuse -Tobacco cessation discussed -nicoderm patch  Chronic leg pain -Restart home dose hydrocodone -Venous duplex--neg for DVT  Morbid Obesity -BMI 42.17 -lifestyle modification       Disposition Plan: Patient From: Home D/C Place:SNF Barriers: Not Clinically Stable--AMS, remains in afib RVR, nephrology following  Family Communication:Daughter updated 3/16   Consultants:none  Code Status: FULL   DVT Prophylaxis:pt continues to have supratherapeutic INR   Procedures: As Listed in Progress Note Above  Antibiotics: Vanc/cefepime 3/15>>> ceftriaxone 3/9>>3/15 azithro 3/9>>>3/13     Subjective: Patient denies fevers, chills, headache, chest pain, dyspnea, nausea, vomiting, diarrhea, abdominal pain, dysuria, hematuria, hematochezia, and melena.   Objective: Vitals:   03/14/20 0600 03/14/20 0700 03/14/20 0751 03/14/20 0900  BP: 100/81 (!) 123/94  112/79  Pulse: 92 100  99  Resp: 15 16  20   Temp:   97.6 F (36.4 C)   TempSrc:   Oral   SpO2: 99% 98%  98%  Weight:      Height:        Intake/Output Summary (Last 24 hours) at 03/14/2020 1103 Last data filed at 03/14/2020 0900 Gross per 24 hour  Intake 772.54 ml  Output 1250 ml  Net -477.46 ml   Weight change: -0.7 kg Exam:   General:  Pt is alert, follows commands appropriately, not in acute distress  HEENT: No icterus, No thrush, No neck mass, [place]/AT  Cardiovascular: IRRR, S1/S2, no rubs, no gallops  Respiratory: bibasilar crackles. No wheeze  Abdomen: Soft/+BS, non tender, non distended, no guarding  Extremities: 1+ LE edema, No  lymphangitis, No petechiae, No rashes, no synovitis   Data Reviewed: I have personally reviewed following labs and imaging studies Basic Metabolic Panel: Recent Labs  Lab 03/11/20 0610 03/12/20 0633 03/13/20 0452 03/13/20 0916 03/14/20 0401  NA 133* 133* 134* 134* 138  138  K 3.9 4.8 5.3* 5.2* 4.7  4.6  CL 94* 96* 96* 96* 99  99  CO2 22 20* 14* 13* 21*  21*  GLUCOSE 147* 74 97 121* 120*  120*  BUN 86* 89* 97* 101* 112*  112*  CREATININE 2.53* 2.50* 2.92* 3.02* 2.73*  2.73*  CALCIUM 8.7* 9.0 9.0 9.1 8.6*  8.5*  PHOS  --   --   --   --  6.2*   Liver Function Tests: Recent Labs  Lab 03/07/20 1107 03/08/20 0548 03/10/20 0450 03/13/20 0452 03/14/20 0401  AST 66* 44* 22 33 44*  ALT 123* 95* 71* 56* 61*  ALKPHOS 315* 295* 286* 278* 220*  BILITOT 7.4* 6.2* 5.1* 9.2* 10.6*  PROT 5.7* 5.4* 5.4* 5.3* 4.8*  ALBUMIN 2.9* 2.5* 2.5* 2.5* 2.4*  2.4*   No results for input(s): LIPASE, AMYLASE in the last 168 hours. Recent Labs  Lab 03/13/20 0916  AMMONIA 26   Coagulation Profile: Recent Labs  Lab 03/10/20 1437 03/11/20 0610 03/12/20 0633 03/13/20 0452 03/14/20 0401  INR 2.9* 3.1* 4.8* 6.2* 2.3*   CBC: Recent Labs  Lab 03/07/20 1107 03/08/20 0548 03/12/20 1010 03/13/20 0916  WBC 17.8* 13.1* 10.5 14.1*  NEUTROABS 16.1* 12.0*  --   --   HGB 12.6 12.3 13.7 15.0  HCT 43.0 42.3 47.4* 51.9*  MCV 94.9 94.0 94.6 94.7  PLT 82* 75* 73* 83*   Cardiac Enzymes: No results for input(s): CKTOTAL, CKMB, CKMBINDEX, TROPONINI in the last 168 hours. BNP:  Invalid input(s): POCBNP CBG: Recent Labs  Lab 03/13/20 1109 03/13/20 1253 03/13/20 1622 03/13/20 2255 03/14/20 0744  GLUCAP 110* 99 126* 132* 113*   HbA1C: No results for input(s): HGBA1C in the last 72 hours. Urine analysis:    Component Value Date/Time   COLORURINE YELLOW 03/13/2020 1200   APPEARANCEUR HAZY (A) 03/13/2020 1200   LABSPEC >1.030 (H) 03/13/2020 1200   PHURINE 5.0 03/13/2020 1200    GLUCOSEU 100 (A) 03/13/2020 1200   HGBUR LARGE (A) 03/13/2020 1200   BILIRUBINUR MODERATE (A) 03/13/2020 1200   KETONESUR NEGATIVE 03/13/2020 1200   PROTEINUR 30 (A) 03/13/2020 1200   UROBILINOGEN 0.2 08/03/2013 2205   NITRITE NEGATIVE 03/13/2020 1200   LEUKOCYTESUR TRACE (A) 03/13/2020 1200   Sepsis Labs: @LABRCNTIP (procalcitonin:4,lacticidven:4) ) Recent Results (from the past 240 hour(s))  Urine Culture     Status: Abnormal   Collection Time: 03/07/20 11:07 AM   Specimen: Urine, Random  Result Value Ref Range Status   Specimen Description   Final    URINE, RANDOM Performed at [facility], [address], [place], [place] [address]    Special Requests   Final    NONE Performed at [facility], [address], [place], [place] [address]    Culture >=100,000 COLONIES/mL ESCHERICHIA COLI (A)  Final   Report Status 03/09/2020 FINAL  Final   Organism ID, Bacteria ESCHERICHIA COLI (A)  Final      Susceptibility   Escherichia coli - MIC*    AMPICILLIN >=32 RESISTANT Resistant     CEFAZOLIN <=4 SENSITIVE Sensitive     CEFTRIAXONE <=0.25 SENSITIVE Sensitive     CIPROFLOXACIN <=0.25 SENSITIVE Sensitive     GENTAMICIN <=1 SENSITIVE Sensitive     IMIPENEM <=0.25 SENSITIVE Sensitive     NITROFURANTOIN <=16 SENSITIVE Sensitive     TRIMETH/SULFA <=20 SENSITIVE Sensitive     AMPICILLIN/SULBACTAM >=32 RESISTANT Resistant     PIP/TAZO <=4 SENSITIVE Sensitive     * >=100,000 COLONIES/mL ESCHERICHIA COLI  Respiratory Panel by RT PCR (Flu A&B, Covid) - Nasopharyngeal Swab     Status: None   Collection Time: 03/07/20 11:53 AM   Specimen: Nasopharyngeal Swab  Result Value Ref Range Status   SARS Coronavirus 2 by RT PCR NEGATIVE NEGATIVE Final    Comment: (NOTE) SARS-CoV-2 target nucleic acids are NOT DETECTED. The SARS-CoV-2 RNA is generally detectable in upper respiratoy specimens during the acute phase of infection. The lowest concentration of SARS-CoV-2 viral copies this assay can  detect is 131 copies/mL. A negative result does not preclude SARS-Cov-2 infection and should not be used as the sole basis for treatment or other patient management decisions. A negative result may occur with  improper specimen collection/handling, submission of specimen other than nasopharyngeal swab, presence of viral mutation(s) within the areas targeted by this assay, and inadequate number of viral copies (<131 copies/mL). A negative result must be combined with clinical observations, patient history, and epidemiological information. The expected result is Negative. Fact Sheet for Patients:  PinkCheek.be Fact Sheet for Healthcare Providers:  GravelBags.it This test is not yet ap proved or cleared by the [place] FDA and  has been authorized for detection and/or diagnosis of SARS-CoV-2 by FDA under an Emergency Use Authorization (EUA). This EUA will remain  in effect (meaning this test can be used) for the duration of the COVID-19 declaration under Section 564(b)(1) of the Act, 21 U.S.C. section 360bbb-3(b)(1), unless the authorization is terminated or revoked sooner.    Influenza A by  PCR NEGATIVE NEGATIVE Final   Influenza B by PCR NEGATIVE NEGATIVE Final    Comment: (NOTE) The Xpert Xpress SARS-CoV-2/FLU/RSV assay is intended as an aid in  the diagnosis of influenza from Nasopharyngeal swab specimens and  should not be used as a sole basis for treatment. Nasal washings and  aspirates are unacceptable for Xpert Xpress SARS-CoV-2/FLU/RSV  testing. Fact Sheet for Patients: PinkCheek.be Fact Sheet for Healthcare Providers: GravelBags.it This test is not yet approved or cleared by the [place] FDA and  has been authorized for detection and/or diagnosis of SARS-CoV-2 by  FDA under an Emergency Use Authorization (EUA). This EUA will remain  in effect (meaning  this test can be used) for the duration of the  Covid-19 declaration under Section 564(b)(1) of the Act, 21  U.S.C. section 360bbb-3(b)(1), unless the authorization is  terminated or revoked. Performed at [facility], [address], [place], [place] [address]   Culture, blood (Routine X 2) w Reflex to ID Panel     Status: None (Preliminary result)   Collection Time: 03/13/20  9:16 AM   Specimen: BLOOD RIGHT ARM  Result Value Ref Range Status   Specimen Description BLOOD RIGHT ARM  Final   Special Requests   Final    BOTTLES DRAWN AEROBIC AND ANAEROBIC Blood Culture adequate volume   Culture   Final    NO GROWTH < 24 HOURS Performed at [facility], [address], [place], [place] [address]    Report Status PENDING  Incomplete  Culture, blood (Routine X 2) w Reflex to ID Panel     Status: None (Preliminary result)   Collection Time: 03/13/20  9:16 AM   Specimen: BLOOD  Result Value Ref Range Status   Specimen Description BLOOD DRAWN BY IV THERAPY DRAWN BY RN  Final   Special Requests   Final    BOTTLES DRAWN AEROBIC AND ANAEROBIC Blood Culture results may not be optimal due to an inadequate volume of blood received in culture bottles   Culture  Setup Time   Final    IN BOTH AEROBIC AND ANAEROBIC BOTTLES GRAM POSITIVE COCCI Gram Stain Report Called to,Read Back By and Verified With: S WADE,RN @1945  03/13/20 MKELLY    Culture   Final    NO GROWTH < 24 HOURS Performed at [facility], [address], [place], [place] [address]    Report Status PENDING  Incomplete  MRSA PCR Screening     Status: None   Collection Time: 03/13/20 11:59 AM   Specimen: Nasal Mucosa; Nasopharyngeal  Result Value Ref Range Status   MRSA by PCR NEGATIVE NEGATIVE Final    Comment:        The GeneXpert MRSA Assay (FDA approved for NASAL specimens only), is one component of a comprehensive MRSA colonization surveillance program. It is not intended to diagnose MRSA infection nor to guide or monitor  treatment for MRSA infections. Performed at [facility], [address], [place], [place] [address]      Scheduled Meds: . budesonide (PULMICORT) nebulizer solution  0.25 mg Nebulization BID  . Chlorhexidine Gluconate Cloth  6 each Topical Daily  . Chlorhexidine Gluconate Cloth  6 each Topical Q0600  . insulin aspart  0-9 Units Subcutaneous TID WC  . ipratropium-albuterol  3 mL Nebulization TID  . metoprolol tartrate  5 mg Intravenous Q6H  . naLOXone (NARCAN)  injection  0.4 mg Intravenous Once  . nicotine  21 mg Transdermal Daily  . sodium chloride flush  10-40 mL Intracatheter Q12H  . sodium chloride flush  3 mL Intravenous Q12H   Continuous Infusions: . sodium chloride    . ceFEPime (MAXIPIME) IV Stopped (03/13/20 1341)  . diltiazem (CARDIZEM) infusion 2 mg/hr (03/14/20 0600)  . [START ON 03/15/2020] vancomycin      Procedures/Studies: CT HEAD WO CONTRAST  Result Date: 03/13/2020 CLINICAL DATA:  Encephalopathy EXAM: CT HEAD WITHOUT CONTRAST TECHNIQUE: Contiguous axial images were obtained from the base of the skull through the vertex without intravenous contrast. COMPARISON:  02/24/2020 FINDINGS: Brain: There is no acute intracranial hemorrhage, mass effect, or edema. Gray-white differentiation is preserved. There is no extra-axial fluid collection. Ventricles and sulci are within normal limits in size and configuration. Patchy hypoattenuation in the supratentorial white matter is nonspecific but likely reflects reflects stable ischemic changes. 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 Vascular: There is mild atherosclerotic calcification at the skull base. Skull: Calvarium is unremarkable. Sinuses/Orbits: No acute finding. Other: None. IMPRESSION: No acute intracranial abnormality. Stable chronic findings detailed above. Electronically Signed   By: Macy Mis M.D.   On: 03/13/2020 11:11   US Venous Img Lower  Bilateral (DVT)  Result Date: 03/08/2020 CLINICAL DATA:  Chronic bilateral lower extremity edema, worsened during the past week. History of previous right lower extremity DVT (femoral and popliteal veins). History of smoking. Evaluate for DVT. EXAM: BILATERAL LOWER EXTREMITY VENOUS DOPPLER ULTRASOUND TECHNIQUE: Gray-scale sonography with graded compression, as well as color Doppler and duplex ultrasound were performed to evaluate the lower extremity deep venous systems from the level of the common femoral vein and including the common femoral, femoral, profunda femoral, popliteal and calf veins including the posterior tibial, peroneal and gastrocnemius veins when visible. The superficial great saphenous vein was also interrogated. Spectral Doppler was utilized to evaluate flow at rest and with distal augmentation maneuvers in the common femoral, femoral and popliteal veins. COMPARISON:  Bilateral lower extremity venous Doppler ultrasound-09/22/2017 (negative). FINDINGS: Examination is degraded due to patient body habitus and poor sonographic window RIGHT LOWER EXTREMITY Common Femoral Vein: No evidence of thrombus. Normal compressibility, respiratory phasicity and response to augmentation. Saphenofemoral Junction: No evidence of thrombus. Normal compressibility and flow on color Doppler imaging. Profunda Femoral Vein: No evidence of thrombus. Normal compressibility and flow on color Doppler imaging. Femoral Vein: Appears patent where imaged. Popliteal Vein: No evidence of acute or chronic thrombus. Normal compressibility, respiratory phasicity and response to augmentation. Calf Veins: Appear patent where imaged. Superficial Great Saphenous Vein: No evidence of thrombus. Normal compressibility. Venous Reflux:  None. Other Findings:  None. LEFT LOWER EXTREMITY Common Femoral Vein: No evidence of thrombus. Normal compressibility, respiratory phasicity and response to augmentation. Saphenofemoral Junction: No evidence  of thrombus. Normal compressibility and flow on color Doppler imaging. Profunda Femoral Vein: No evidence of thrombus. Normal compressibility and flow on color Doppler imaging. Femoral Vein: Appears patent where imaged. Popliteal Vein: No evidence of thrombus. Normal compressibility, respiratory phasicity and response to augmentation. Calf Veins: Appear patent where imaged. Superficial Great Saphenous Vein: No evidence of thrombus. Normal compressibility. Venous Reflux:  None. Other Findings:  None. IMPRESSION: No evidence of acute or chronic DVT within either lower extremity on this body habitus degraded examination. Electronically Signed   By: Sandi Mariscal M.D.   On: 03/08/2020 16:21   DG Chest Port 1 View  Result Date: 03/13/2020 CLINICAL DATA:  Dyspnea, chronic respiratory failure, worsening lower extremity edema, generalized weakness and dyspnea  on exertion EXAM: PORTABLE CHEST 1 VIEW COMPARISON:  Radiograph 03/07/2020, CT 03/21/2015 FINDINGS: There is cardiomegaly with cephalized vascularity. Tortuosity of the brachiocephalic vessels along the right upper mediastinum is similar to priors. Few bandlike opacities in the mid lungs may reflect areas of subsegmental atelectasis. No consolidative opacity, pneumothorax or effusion is seen. No acute osseous or soft tissue abnormality. IMPRESSION: Cardiomegaly and vascular congestion may reflect features of heart failure. Bandlike opacities, likely subsegmental atelectasis. Electronically Signed   By: Lovena Le M.D.   On: 03/13/2020 05:52   DG Chest Port 1 View  Result Date: 03/07/2020 CLINICAL DATA:  Shortness of breath EXAM: PORTABLE CHEST 1 VIEW COMPARISON:  02/17/2020 FINDINGS: Stable cardiomegaly. Calcific aortic knob. Mild vascular congestion and subtly increased interstitial markings bilaterally. Linear atelectasis within the peripheral aspect of the left mid lung. The visualized skeletal structures are unremarkable. IMPRESSION: Cardiomegaly with mild  pulmonary vascular congestion and subtly increased interstitial markings suggesting mild edema. Electronically Signed   By: Davina Poke D.O.   On: 03/07/2020 12:29   DG Chest Port 1 View  Result Date: 02/17/2020 CLINICAL DATA:  Shortness of breath EXAM: PORTABLE CHEST 1 VIEW COMPARISON:  February 14, 2020 FINDINGS: There is scarring in the right base region. There is mild atelectasis in the left base. There is stable cardiomegaly with pulmonary venous hypertension. No adenopathy. There is aortic atherosclerosis. No bone lesions. IMPRESSION: Cardiomegaly with pulmonary vascular congestion. Mild scarring right base. Mild atelectasis left base. No appreciable pulmonary edema. Aortic Atherosclerosis (ICD10-I70.0). Electronically Signed   By: Lowella Grip III M.D.   On: 02/17/2020 07:46   DG Chest Port 1 View  Result Date: 02/14/2020 CLINICAL DATA:  Shortness of breath and fluid retention. EXAM: PORTABLE CHEST 1 VIEW COMPARISON:  PA and lateral chest 02/08/2020 and 05/20/2019. FINDINGS: There is marked cardiomegaly and pulmonary vascular congestion. No consolidative process, pneumothorax or effusion. Atherosclerosis is noted. No acute or focal bony abnormality. IMPRESSION: Cardiomegaly and vascular congestion. Atherosclerosis. Electronically Signed   By: Inge Rise M.D.   On: 02/14/2020 17:45   ECHOCARDIOGRAM COMPLETE  Result Date: 02/16/2020    ECHOCARDIOGRAM REPORT   Patient Name:   AERIKA GROLL Date of Exam: 02/16/2020 Medical Rec #:  [id number]             Height:       66.0 in Accession #:    [id number]            Weight:       266.1 lb Date of Birth:  11/19/1961            BSA:          2.26 m Patient Age:    58 years              BP:           126/89 mmHg Patient Gender: F                     HR:           61 bpm. Exam Location:  [facility] Procedure: 2D Echo Indications:    Dyspnea 786.09 / R06.00  History:        Patient has prior history of Echocardiogram examinations, most                  recent 09/22/2017. CHF, Stroke and COPD; Risk Factors:Current  Smoker, Diabetes and Hypertension. Crack cocaine use, RBBB,                 Acute renal failure , LVH , Acute respiratory failure with                 hypoxia.  Sonographer:    Leavy Cella RDCS (AE) Referring Phys: [id number] COURAGE EMOKPAE IMPRESSIONS  1. Left ventricular ejection fraction, by estimation, is 55 to 60%. The left ventricle has normal function. The left ventricle has no regional wall motion abnormalities. There is mild concentric left ventricular hypertrophy. Left ventricular diastolic parameters are indeterminate. Elevated left ventricular end-diastolic pressure.  2. Right ventricular systolic function is severely reduced. The right ventricular size is moderately enlarged. mildly increased right ventricular wall thickness. There is moderately elevated pulmonary artery systolic pressure.  3. Left atrial size was severely dilated.  4. Right atrial size was severely dilated.  5. The mitral valve is grossly normal. Mild mitral valve regurgitation.  6. Tricuspid valve regurgitation is moderate.  7. The aortic valve is tricuspid. Aortic valve regurgitation is not visualized. No aortic stenosis is present.  8. The inferior vena cava is dilated in size with >50% respiratory variability, suggesting right atrial pressure of 8 mmHg. FINDINGS  Left Ventricle: Left ventricular ejection fraction, by estimation, is 55 to 60%. The left ventricle has normal function. The left ventricle has no regional wall motion abnormalities. The left ventricular internal cavity size was normal in size. There is  mild concentric left ventricular hypertrophy. Left ventricular diastolic parameters are indeterminate. Elevated left ventricular end-diastolic pressure. Right Ventricle: The right ventricular size is moderately enlarged. Mildly increased right ventricular wall thickness. Right ventricular systolic function is severely reduced. There  is moderately elevated pulmonary artery systolic pressure. The tricuspid  regurgitant velocity is 3.12 m/s, and with an assumed right atrial pressure of 10 mmHg, the estimated right ventricular systolic pressure is 54.4 mmHg. Left Atrium: Left atrial size was severely dilated. Right Atrium: Right atrial size was severely dilated. Pericardium: There is no evidence of pericardial effusion. Mitral Valve: The mitral valve is grossly normal. Mild mitral annular calcification. Mild mitral valve regurgitation. Tricuspid Valve: The tricuspid valve is grossly normal. Tricuspid valve regurgitation is moderate. Aortic Valve: The aortic valve is tricuspid. Aortic valve regurgitation is not visualized. No aortic stenosis is present. Pulmonic Valve: The pulmonic valve was grossly normal. Pulmonic valve regurgitation is not visualized. Aorta: The aortic root is normal in size and structure. Venous: The inferior vena cava is dilated in size with greater than 50% respiratory variability, suggesting right atrial pressure of 8 mmHg. IAS/Shunts: No atrial level shunt detected by color flow Doppler.  LEFT VENTRICLE PLAX 2D LVIDd:         4.54 cm  Diastology LVIDs:         2.77 cm  LV e' lateral:   11.20 cm/s LV PW:         1.40 cm  LV E/e' lateral: 7.8 LV IVS:        1.16 cm  LV e' medial:    4.24 cm/s LVOT diam:     2.10 cm  LV E/e' medial:  20.7 LV SV Index:   26.96 LVOT Area:     3.46 cm  RIGHT VENTRICLE RV S prime:     8.05 cm/s LEFT ATRIUM              Index       RIGHT ATRIUM  Index LA diam:        4.40 cm  1.95 cm/m  RA Area:     41.10 cm LA Vol (A2C):   143.0 ml 63.34 ml/m RA Volume:   176.00 ml 77.95 ml/m LA Vol (A4C):   99.1 ml  43.89 ml/m LA Biplane Vol: 121.0 ml 53.59 ml/m   AORTA Ao Root diam: 3.40 cm MITRAL VALVE               TRICUSPID VALVE MV Area (PHT): 4.06 cm    TR Peak grad:   38.9 mmHg MV Decel Time: 187 msec    TR Vmax:        312.00 cm/s MR Peak grad: 62.1 mmHg MR Vmax:      394.00 cm/s  SHUNTS  MV E velocity: 87.80 cm/s  Systemic Diam: 2.10 cm MV A velocity: 47.50 cm/s MV E/A ratio:  1.85 Kate Sable MD Electronically signed by Kate Sable MD Signature Date/Time: 02/16/2020/4:32:20 PM    Final    US Abdomen Limited RUQ  Result Date: 03/07/2020 CLINICAL DATA:  Right upper quadrant pain and elevated LFTs EXAM: ULTRASOUND ABDOMEN LIMITED RIGHT UPPER QUADRANT COMPARISON:  02/16/2020 FINDINGS: Gallbladder: No gallstones or wall thickening visualized. No sonographic Murphy sign noted by sonographer. Common bile duct: Diameter: 4 mm Liver: No focal lesion identified. Within normal limits in parenchymal echogenicity. Portal vein is patent on color Doppler imaging with normal direction of blood flow towards the liver. Other: None. IMPRESSION: No acute abnormality noted. Electronically Signed   By: Inez Catalina M.D.   On: 03/07/2020 15:05   US Abdomen Limited RUQ  Result Date: 02/16/2020 CLINICAL DATA:  Bilirubinemia EXAM: ULTRASOUND ABDOMEN LIMITED RIGHT UPPER QUADRANT COMPARISON:  None. FINDINGS: Gallbladder: No gallstones or wall thickening visualized. No sonographic Murphy sign noted by sonographer. Common bile duct: Diameter: 3 mm Liver: No focal lesion identified. Within normal limits in parenchymal echogenicity. Portal vein is patent on color Doppler imaging with normal direction of blood flow towards the liver. IMPRESSION: Negative right upper quadrant ultrasound. Electronically Signed   By: Monte Fantasia M.D.   On: 02/16/2020 10:40    Orson Eva, DO  [facility]  If 7PM-7AM, please contact night-coverage [email or web address] Password [email or web address] 03/14/2020, 11:03 AM   LOS: 7 days

## 2020-03-14 NOTE — Progress Notes (Signed)
Patient did not require the BIPAP throughout the night.  BIPAP remains at beside PRN.

## 2020-03-14 NOTE — Progress Notes (Signed)
MD Tat notified MRI is unavailable all week at [facility].

## 2020-03-14 NOTE — Procedures (Signed)
Procedure Note  @DATE @    Preoperative Diagnosis: Poor access, prior femoral line in place for 6 days, chronic renal disease stage IV    Postoperative Diagnosis: Same   Procedure(s) Performed: Central Line placement, left femoral line under ultrasound guidance    Surgeon: Lanell Matar. Constance Haw, MD   Assistants: None   Anesthesia: 1% lidocaine    Complications: None    Indications: Tracy [name] is a 59 y.o. with chronic renal disease, poor venous access and prior femoral line placed due to elevated INR and need for access. This has been in for about 6 days and needs to be changed due to risk of infection. I discussed the risk and benefits of placement of the central line with her daughter, including but not limited to bleeding, infection, and risk of injury to the vessels. Patient's INR 2.3 which is down from previous, but will still require femoral placement. She has given verbal consent for the procedure.    Procedure: The patient placed supine.  The right groin line was removed and the RN held pressure during the procedure (total time 30 minutes).  The left groin was prepped and draped in the usual sterile fashion.  Wearing full gown and gloves, I performed the procedure.  One percent lidocaine was used for local anesthesia. An ultrasound was utilized to assess the left femoral vein.  The needle with syringe was advanced into the vein with dark venous return, and a wire was placed using the Seldinger technique without difficulty.  The skin was knicked and a dilator was placed, and the three lumen catheter was placed over the wire with continued control of the wire.  There was good draw back of blood from all three lumens and each flushed easily with saline.  The catheter was secured with 2-0 silk and a biopatch and dressing was placed.     The patient tolerated the procedure well.  Curlene Labrum, MD [facility] [address] [place], [place]  [phone] (office)

## 2020-03-14 NOTE — Care Management Important Message (Signed)
Important Message  Patient Details  Name: Tracy [name] MRN: [id number] Date of Birth: Sep 14, 1961   Medicare Important Message Given:  Yes     [name] 03/14/2020, 3:38 PM

## 2020-03-14 NOTE — Progress Notes (Signed)
Patient is currently on [facility] with sats of 99% and is resting comfortably. All vitals are stable. BIPAP is at bedside but is not needed at this time.

## 2020-03-14 NOTE — Progress Notes (Addendum)
Nephrology Progress Note:  Patient ID: Tracy [name], female   DOB: May 03, 1961, 59 y.o.   MRN: [id number]   S:  BIPAP has been off overnight.  She had 400 mL UOP charted over 3/15 but has 1 liter in foley on my exam. She was initiated on dilt gtt and is on during my exam.  Pred was tapered and she did not receive lasix 3/15.  She has been on 2.5 - 3 liters oxygen this AM.   Review of systems:  She reports shortness of breath  Denies nausea/vomiting; not eating much  Some AMS which limits hx   O:BP (!) 123/94   Pulse 100   Temp 97.6 F (36.4 C) (Oral)   Resp 16   Ht 5\' 6"  (1.676 m)   Wt 115.6 kg   SpO2 98%   BMI 41.13 kg/m   Intake/Output Summary (Last 24 hours) at 03/14/2020 0823 Last data filed at 03/14/2020 0600 Gross per 24 hour  Intake 872.54 ml  Output 400 ml  Net 472.54 ml   Intake/Output: I/O last 3 completed shifts: In: 872.5 [I.V.:32.5; IV Piggyback:840] Out: 800 [Urine:800]  Intake/Output this shift:  No intake/output data recorded. Weight change: -0.7 kg   Physical exam:  Gen: obese female in bed  HEENT increased neck circumference; trachea midline  CVS: S1S2 no rub tachy to 90-100 on exam  Resp: decreased BS bilaterally; on 2.5 liters oxygen  Abd: obese habitus soft  GU foley in place with 1 liter urine  Neuro - oriented to person and year and location but some answers delayed  Ext: +1 lower extremity edema and abd wall edema  Recent Labs  Lab 03/07/20 1107 03/07/20 1107 03/08/20 0548 03/08/20 0548 03/09/20 0447 03/10/20 0450 03/11/20 0610 03/12/20 0633 03/13/20 0452 03/13/20 0916 03/14/20 0401  NA 138   < > 137   < > 130* 133* 133* 133* 134* 134* 138  138  K 3.5   < > 4.8   < > 4.5 4.2 3.9 4.8 5.3* 5.2* 4.7  4.6  CL 100   < > 100   < > 95* 97* 94* 96* 96* 96* 99  99  CO2 24   < > 20*   < > 18* 19* 22 20* 14* 13* 21*  21*  GLUCOSE 98   < > 141*   < > 203* 207* 147* 74 97 121* 120*  120*  BUN 67*   < > 66*   < > 71* 83* 86* 89* 97*  101* 112*  112*  CREATININE 2.63*   < > 2.34*   < > 2.81* 2.73* 2.53* 2.50* 2.92* 3.02* 2.73*  2.73*  ALBUMIN 2.9*  --  2.5*  --   --  2.5*  --   --  2.5*  --  2.4*  2.4*  CALCIUM 8.2*   < > 8.7*   < > 8.7* 8.6* 8.7* 9.0 9.0 9.1 8.6*  8.5*  PHOS  --   --   --   --   --   --   --   --   --   --  6.2*  AST 66*  --  44*  --   --  22  --   --  33  --  44*  ALT 123*  --  95*  --   --  71*  --   --  56*  --  61*   < > = values in this interval not displayed.  Liver Function Tests: Recent Labs  Lab 03/10/20 0450 03/13/20 0452 03/14/20 0401  AST 22 33 44*  ALT 71* 56* 61*  ALKPHOS 286* 278* 220*  BILITOT 5.1* 9.2* 10.6*  PROT 5.4* 5.3* 4.8*  ALBUMIN 2.5* 2.5* 2.4*  2.4*    Recent Labs  Lab 03/13/20 0916  AMMONIA 26   CBC: Recent Labs  Lab 03/07/20 1107 03/07/20 1107 03/08/20 0548 03/12/20 1010 03/13/20 0916  WBC 17.8*   < > 13.1* 10.5 14.1*  NEUTROABS 16.1*  --  12.0*  --   --   HGB 12.6   < > 12.3 13.7 15.0  HCT 43.0   < > 42.3 47.4* 51.9*  MCV 94.9  --  94.0 94.6 94.7  PLT 82*   < > 75* 73* 83*   < > = values in this interval not displayed.   Cardiac Enzymes: No results for input(s): CKTOTAL, CKMB, CKMBINDEX, TROPONINI in the last 168 hours. CBG: Recent Labs  Lab 03/13/20 1109 03/13/20 1253 03/13/20 1622 03/13/20 2255 03/14/20 0744  GLUCAP 110* 99 126* 132* 113*    Iron Studies: No results for input(s): IRON, TIBC, TRANSFERRIN, FERRITIN in the last 72 hours. Studies/Results: CT HEAD WO CONTRAST  Result Date: 03/13/2020 CLINICAL DATA:  Encephalopathy EXAM: CT HEAD WITHOUT CONTRAST TECHNIQUE: Contiguous axial images were obtained from the base of the skull through the vertex without intravenous contrast. COMPARISON:  02/24/2020 FINDINGS: Brain: There is no acute intracranial hemorrhage, mass effect, or edema. Gray-white differentiation is preserved. There is no extra-axial fluid collection. Ventricles and sulci are within normal limits in size and  configuration. Patchy hypoattenuation in the supratentorial white matter is nonspecific but likely reflects reflects stable ischemic changes. 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 Vascular: There is mild atherosclerotic calcification at the skull base. Skull: Calvarium is unremarkable. Sinuses/Orbits: No acute finding. Other: None. IMPRESSION: No acute intracranial abnormality. Stable chronic findings detailed above. Electronically Signed   By: Macy Mis M.D.   On: 03/13/2020 11:11   DG Chest Port 1 View  Result Date: 03/13/2020 CLINICAL DATA:  Dyspnea, chronic respiratory failure, worsening lower extremity edema, generalized weakness and dyspnea on exertion EXAM: PORTABLE CHEST 1 VIEW COMPARISON:  Radiograph 03/07/2020, CT 03/21/2015 FINDINGS: There is cardiomegaly with cephalized vascularity. Tortuosity of the brachiocephalic vessels along the right upper mediastinum is similar to priors. Few bandlike opacities in the mid lungs may reflect areas of subsegmental atelectasis. No consolidative opacity, pneumothorax or effusion is seen. No acute osseous or soft tissue abnormality. IMPRESSION: Cardiomegaly and vascular congestion may reflect features of heart failure. Bandlike opacities, likely subsegmental atelectasis. Electronically Signed   By: Lovena Le M.D.   On: 03/13/2020 05:52   Medications:  . budesonide (PULMICORT) nebulizer solution  0.25 mg Nebulization BID  . Chlorhexidine Gluconate Cloth  6 each Topical Daily  . Chlorhexidine Gluconate Cloth  6 each Topical Q0600  . insulin aspart  0-9 Units Subcutaneous TID WC  . ipratropium-albuterol  3 mL Nebulization TID  . metoprolol tartrate  5 mg Intravenous Q6H  . naLOXone (NARCAN)  injection  0.4 mg Intravenous Once  . nicotine  21 mg Transdermal Daily  . sodium chloride flush  10-40 mL Intracatheter Q12H  . sodium chloride flush  3 mL Intravenous Q12H    BMET     Component Value Date/Time   NA 138 03/14/2020 0401   NA 138 03/14/2020 0401   K 4.7 03/14/2020  0401   K 4.6 03/14/2020 0401   CL 99 03/14/2020 0401   CL 99 03/14/2020 0401   CO2 21 (L) 03/14/2020 0401   CO2 21 (L) 03/14/2020 0401   GLUCOSE 120 (H) 03/14/2020 0401   GLUCOSE 120 (H) 03/14/2020 0401   BUN 112 (H) 03/14/2020 0401   BUN 112 (H) 03/14/2020 0401   CREATININE 2.73 (H) 03/14/2020 0401   CREATININE 2.73 (H) 03/14/2020 0401   CALCIUM 8.6 (L) 03/14/2020 0401   CALCIUM 8.5 (L) 03/14/2020 0401   GFRNONAA 18 (L) 03/14/2020 0401   GFRNONAA 18 (L) 03/14/2020 0401   GFRAA 21 (L) 03/14/2020 0401   GFRAA 21 (L) 03/14/2020 0401   CBC    Component Value Date/Time   WBC 14.1 (H) 03/13/2020 0916   RBC 5.48 (H) 03/13/2020 0916   HGB 15.0 03/13/2020 0916   HCT 51.9 (H) 03/13/2020 0916   PLT 83 (L) 03/13/2020 0916   MCV 94.7 03/13/2020 0916   MCH 27.4 03/13/2020 0916   MCHC 28.9 (L) 03/13/2020 0916   RDW 25.1 (H) 03/13/2020 0916   LYMPHSABS 0.8 03/08/2020 0548   MONOABS 0.2 03/08/2020 0548   EOSABS 0.0 03/08/2020 0548   BASOSABS 0.0 03/08/2020 0548     Assessment/Plan:  AKI/CKD stage 3b-4: in setting of acute on chronic CHF and likely cardiorenal syndrome.  Worsened with A fib and RVR and drops in her BP.  Cr improving with BUN rising. Pred was tapered and she did not receive lasix 3/15.  Bicarb up to 21.  Hematuria with UTI, coagulopathy/elevated INR; minimal proteinuria  - No acute indication for HD at this time   - Continue supportive care - hopeful for plateau of azotemia  - Lasix IV once now as below   1. Acute on chronic respiratory failure- s/p BIPAP and in ICU.  Optimizing volume status as above to assist    2. A fib with RVR-  Rate control per primary team     3. Acute on chronic diastolic CHF/Cor Pulmonale- Lasix IV once now  4. COPD-  S/p BIPAP; per primary team   5. Sepsis - was felt due to E Coli UTI-  S/p ceftriaxone and note on cefepime and vanc    6. Coagulopathy- elevated INR s/p vitamin K. Presumably due to chronic hepatic congestion from cor pulmonale/right heart failure.  Per primary team   7. Thrombocytopenia- chronic.  8. DM type 2- per primary svc  9. Hyperkalemia- resolved with medical management.   10. Hyponatremia- improved - with overload due to CHF   Claudia Desanctis, MD [facility] 03/14/2020  8:45 AM

## 2020-03-15 LAB — COMPREHENSIVE METABOLIC PANEL
ALT: 56 U/L — ABNORMAL HIGH (ref 0–44)
AST: 39 U/L (ref 15–41)
Albumin: 2.4 g/dL — ABNORMAL LOW (ref 3.5–5.0)
Alkaline Phosphatase: 202 U/L — ABNORMAL HIGH (ref 38–126)
Anion gap: 13 (ref 5–15)
BUN: 99 mg/dL — ABNORMAL HIGH (ref 6–20)
CO2: 24 mmol/L (ref 22–32)
Calcium: 8.3 mg/dL — ABNORMAL LOW (ref 8.9–10.3)
Chloride: 102 mmol/L (ref 98–111)
Creatinine, Ser: 2.11 mg/dL — ABNORMAL HIGH (ref 0.44–1.00)
GFR calc Af Amer: 29 mL/min — ABNORMAL LOW (ref >60)
GFR calc non Af Amer: 25 mL/min — ABNORMAL LOW (ref >60)
Glucose, Bld: 104 mg/dL — ABNORMAL HIGH (ref 70–99)
Potassium: 3.7 mmol/L (ref 3.5–5.1)
Sodium: 139 mmol/L (ref 135–145)
Total Bilirubin: 8.7 mg/dL — ABNORMAL HIGH (ref 0.3–1.2)
Total Protein: 4.8 g/dL — ABNORMAL LOW (ref 6.5–8.1)

## 2020-03-15 LAB — RENAL FUNCTION PANEL
Albumin: 2.4 g/dL — ABNORMAL LOW (ref 3.5–5.0)
Anion gap: 13 (ref 5–15)
BUN: 98 mg/dL — ABNORMAL HIGH (ref 6–20)
CO2: 23 mmol/L (ref 22–32)
Calcium: 8.1 mg/dL — ABNORMAL LOW (ref 8.9–10.3)
Chloride: 102 mmol/L (ref 98–111)
Creatinine, Ser: 2.13 mg/dL — ABNORMAL HIGH (ref 0.44–1.00)
GFR calc Af Amer: 29 mL/min — ABNORMAL LOW (ref >60)
GFR calc non Af Amer: 25 mL/min — ABNORMAL LOW (ref >60)
Glucose, Bld: 102 mg/dL — ABNORMAL HIGH (ref 70–99)
Phosphorus: 4 mg/dL (ref 2.5–4.6)
Potassium: 3.7 mmol/L (ref 3.5–5.1)
Sodium: 138 mmol/L (ref 135–145)

## 2020-03-15 LAB — GLUCOSE, CAPILLARY
Glucose-Capillary: 86 mg/dL (ref 70–99)
Glucose-Capillary: 123 mg/dL — ABNORMAL HIGH (ref 70–99)
Glucose-Capillary: 146 mg/dL — ABNORMAL HIGH (ref 70–99)

## 2020-03-15 LAB — PROTIME-INR
INR: 1.5 — ABNORMAL HIGH (ref 0.8–1.2)
Prothrombin Time: 18.1 seconds — ABNORMAL HIGH (ref 11.4–15.2)

## 2020-03-15 MED ORDER — SODIUM CHLORIDE 0.9 % IV SOLN
2.0000 g | Freq: Two times a day (BID) | INTRAVENOUS | Status: AC
  Administered 2020-03-15 (×2): 2 g via INTRAVENOUS
  Filled 2020-03-15 (×2): qty 2

## 2020-03-15 MED ORDER — FUROSEMIDE 10 MG/ML IJ SOLN
80.0000 mg | Freq: Once | INTRAMUSCULAR | Status: AC
  Administered 2020-03-15: 80 mg via INTRAVENOUS
  Filled 2020-03-15: qty 8

## 2020-03-15 MED ORDER — APIXABAN 5 MG PO TABS
5.0000 mg | ORAL_TABLET | Freq: Two times a day (BID) | ORAL | Status: DC
  Administered 2020-03-15 – 2020-03-17 (×5): 5 mg via ORAL
  Filled 2020-03-15 (×5): qty 1

## 2020-03-15 NOTE — TOC Progression Note (Signed)
Transition of Care [facility]) - Progression Note    Patient Details  Name: Tracy [name] MRN: [id number] Date of Birth: [birth year]  Transition of Care [facility] CM/SW Contact  Shade Flood, LCSW Phone Number: 03/15/2020, 1:16 PM  Clinical Narrative:     TOC following. Per MD, pt not yet stable for dc. Pt's SNF auth expires today so will need to initiate new authorization depending on when MD anticipates pt will be stable to dc.   TOC will follow.  Expected Discharge Plan: [place] Barriers to Discharge: Continued Medical Work up  Expected Discharge Plan and Services Expected Discharge Plan: [place] In-house Referral: Clinical Social Work   Post Acute Care Choice: [place] Living arrangements for the past 2 months: Single Family Home                                       Social Determinants of Health (SDOH) Interventions    Readmission Risk Interventions Readmission Risk Prevention Plan 03/12/2020 02/18/2020  Transportation Screening Complete Complete  Medication Review [employer] photographer) Complete Complete  PCP or Specialist appointment within 3-5 days of discharge - Not Complete  HRI or [place] - Complete  SW Recovery Care/Counseling Consult - Complete  Palliative Care Screening - Not Complete  Skilled [place] - Not Complete  Some recent data might be hidden

## 2020-03-15 NOTE — Care Management Important Message (Signed)
Important Message  Patient Details  Name: Tracy [name] MRN: [id number] Date of Birth: 1961-08-04   Medicare Important Message Given:  Yes     [name] 03/15/2020, 2:59 PM

## 2020-03-15 NOTE — Progress Notes (Signed)
Nephrology Progress Note:  Patient ID: Tracy [name], female   DOB: 11/11/1961, 59 y.o.   MRN: [id number]   S:  She had 4.1 liters UOP over 3/16 with IV lasix.  She has been on 3 liters oxygen which is now down to 2 on my exam.  No longer on dilt gtt - note PO is ordered  Review of systems:  She reports shortness of breath is better "breathing is good" Denies nausea/vomiting Denies dizziness or cramping  States she is not aware of any subjective confusion  Urinating via foley   O:BP 102/70   Pulse (!) 101   Temp (!) 97.4 F (36.3 C) (Axillary)   Resp 18   Ht 5\' 6"  (1.676 m)   Wt 115.2 kg   SpO2 99%   BMI 40.99 kg/m   Intake/Output Summary (Last 24 hours) at 03/15/2020 0928 Last data filed at 03/15/2020 0800 Gross per 24 hour  Intake 114.79 ml  Output 3450 ml  Net -3335.21 ml   Intake/Output: I/O last 3 completed shifts: In: 387.3 [I.V.:47.3; IV Piggyback:340] Out: 4200 [Urine:4200]  Intake/Output this shift:  Total I/O In: -  Out: 250 [Urine:250] Weight change: -0.4 kg   Physical exam:  Gen: obese female in bed - repositioned her with an RN and she was not able to assist [place]  HEENT increased neck circumference; trachea midline  CVS: S1S2 no rub tachy to 109 on exam  Resp: decreased BS bilaterally; on 2 liters oxygen  Abd: obese habitus soft  GU foley in place with clearing urine  Neuro - oriented to person and year and location but some answers delayed  Ext: trace to +1 lower extremity edema and abd wall edema  Recent Labs  Lab 03/10/20 0450 03/11/20 0610 03/12/20 0633 03/13/20 0452 03/13/20 0916 03/14/20 0401 03/15/20 0341  NA 133* 133* 133* 134* 134* 138  138 139  138  K 4.2 3.9 4.8 5.3* 5.2* 4.7  4.6 3.7  3.7  CL 97* 94* 96* 96* 96* 99  99 102  102  CO2 19* 22 20* 14* 13* 21*  21* 24  23  GLUCOSE 207* 147* 74 97 121* 120*  120* 104*  102*  BUN 83* 86* 89* 97* 101* 112*  112* 99*  98*  CREATININE 2.73* 2.53* 2.50* 2.92* 3.02* 2.73*   2.73* 2.11*  2.13*  ALBUMIN 2.5*  --   --  2.5*  --  2.4*  2.4* 2.4*  2.4*  CALCIUM 8.6* 8.7* 9.0 9.0 9.1 8.6*  8.5* 8.3*  8.1*  PHOS  --   --   --   --   --  6.2* 4.0  AST 22  --   --  33  --  44* 39  ALT 71*  --   --  56*  --  61* 56*   Liver Function Tests: Recent Labs  Lab 03/13/20 0452 03/14/20 0401 03/15/20 0341  AST 33 44* 39  ALT 56* 61* 56*  ALKPHOS 278* 220* 202*  BILITOT 9.2* 10.6* 8.7*  PROT 5.3* 4.8* 4.8*  ALBUMIN 2.5* 2.4*  2.4* 2.4*  2.4*    Recent Labs  Lab 03/13/20 0916  AMMONIA 26   CBC: Recent Labs  Lab 03/12/20 1010 03/13/20 0916  WBC 10.5 14.1*  HGB 13.7 15.0  HCT 47.4* 51.9*  MCV 94.6 94.7  PLT 73* 83*   Cardiac Enzymes: No results for input(s): CKTOTAL, CKMB, CKMBINDEX, TROPONINI in the last 168 hours. CBG: Recent Labs  Lab  03/14/20 0744 03/14/20 1144 03/14/20 1705 03/14/20 2119 03/15/20 0734  GLUCAP 113* 112* 131* 111* 86    Iron Studies: No results for input(s): IRON, TIBC, TRANSFERRIN, FERRITIN in the last 72 hours. Studies/Results: CT HEAD WO CONTRAST  Result Date: 03/13/2020 CLINICAL DATA:  Encephalopathy EXAM: CT HEAD WITHOUT CONTRAST TECHNIQUE: Contiguous axial images were obtained from the base of the skull through the vertex without intravenous contrast. COMPARISON:  02/24/2020 FINDINGS: Brain: There is no acute intracranial hemorrhage, mass effect, or edema. Gray-white differentiation is preserved. There is no extra-axial fluid collection. Ventricles and sulci are within normal limits in size and configuration. Patchy hypoattenuation in the supratentorial white matter is nonspecific but likely reflects reflects stable ischemic changes. 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 Vascular: There is mild atherosclerotic calcification at the skull base. Skull: Calvarium is unremarkable. Sinuses/Orbits: No acute finding. Other: None. IMPRESSION: No acute  intracranial abnormality. Stable chronic findings detailed above. Electronically Signed   By: Macy Mis M.D.   On: 03/13/2020 11:11   Medications:  . apixaban  5 mg Oral BID  . budesonide (PULMICORT) nebulizer solution  0.25 mg Nebulization BID  . Chlorhexidine Gluconate Cloth  6 each Topical Daily  . Chlorhexidine Gluconate Cloth  6 each Topical Q0600  . diltiazem  30 mg Oral Q6H  . insulin aspart  0-9 Units Subcutaneous TID WC  . ipratropium-albuterol  3 mL Nebulization TID  . metoprolol tartrate  5 mg Intravenous Q6H  . naLOXone (NARCAN)  injection  0.4 mg Intravenous Once  . nicotine  21 mg Transdermal Daily  . sodium chloride flush  10-40 mL Intracatheter Q12H  . sodium chloride flush  3 mL Intravenous Q12H    BMET    Component Value Date/Time   NA 138 03/15/2020 0341   NA 139 03/15/2020 0341   K 3.7 03/15/2020 0341   K 3.7 03/15/2020 0341   CL 102 03/15/2020 0341   CL 102 03/15/2020 0341   CO2 23 03/15/2020 0341   CO2 24 03/15/2020 0341   GLUCOSE 102 (H) 03/15/2020 0341   GLUCOSE 104 (H) 03/15/2020 0341   BUN 98 (H) 03/15/2020 0341   BUN 99 (H) 03/15/2020 0341   CREATININE 2.13 (H) 03/15/2020 0341   CREATININE 2.11 (H) 03/15/2020 0341   CALCIUM 8.1 (L) 03/15/2020 0341   CALCIUM 8.3 (L) 03/15/2020 0341   GFRNONAA 25 (L) 03/15/2020 0341   GFRNONAA 25 (L) 03/15/2020 0341   GFRAA 29 (L) 03/15/2020 0341   GFRAA 29 (L) 03/15/2020 0341   CBC    Component Value Date/Time   WBC 14.1 (H) 03/13/2020 0916   RBC 5.48 (H) 03/13/2020 0916   HGB 15.0 03/13/2020 0916   HCT 51.9 (H) 03/13/2020 0916   PLT 83 (L) 03/13/2020 0916   MCV 94.7 03/13/2020 0916   MCH 27.4 03/13/2020 0916   MCHC 28.9 (L) 03/13/2020 0916   RDW 25.1 (H) 03/13/2020 0916   LYMPHSABS 0.8 03/08/2020 0548   MONOABS 0.2 03/08/2020 0548   EOSABS 0.0 03/08/2020 0548   BASOSABS 0.0 03/08/2020 0548     Assessment/Plan:  AKI/CKD stage 3b-4: in setting of acute on chronic CHF and likely cardiorenal  syndrome.  Worsened with A fib and RVR and drops in her BP.  Pred was tapered.  Got lasix on 3/16.  Hematuria with UTI, coagulopathy/elevated INR; minimal proteinuria.  Improving and tolerating lasix. - No acute indication for HD at this time   -  Continue supportive care - hopeful for continued improvement of azotemia  - Lasix IV once now as below - excellent response to lasix 80 mg IV once on 3/16   1. Acute on chronic respiratory failure- s/p BIPAP and in ICU.  Optimizing volume status as above to assist    2. A fib with RVR-  Rate control per primary team     3. 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 COPD-  S/p BIPAP; per primary team   5. Sepsis - was felt due to E Coli UTI-  S/p ceftriaxone and note now on cefepime and vanc for HCAP    6. Coagulopathy- elevated INR s/p vitamin K.  May be due to chronic hepatic congestion from cor pulmonale/right heart failure.  Per primary team   7. Thrombocytopenia- chronic.  8. DM type 2- per primary svc  9. Hyperkalemia- resolved with medical management.   10. Hyponatremia- improved - with overload due to CHF   Claudia Desanctis, MD [facility] 03/15/2020  9:44 AM

## 2020-03-15 NOTE — Progress Notes (Signed)
PROGRESS NOTE  Tracy [name] PIR:[id number] DOB: [birth year] DOA: 03/07/2020 PCP: Rory Percy, MD  Brief History: 59 year old female with a history of chronic respiratory failure on 3L,diastolic CHF, COPD, tobacco abuse, diabetes mellitus type 2, hypertension, remote cocaine use presenting with 1 week history of worsening lower extremity edema, generalized weakness, and dyspnea on exertion. The patient has had decreased oral intake. She was noted to have some confusion with hypoglycemia at home. As a result, the patient was brought for further evaluation. Patient had recent admission to the hospital from 02/15/2020 to 02/21/2020 for acute on chronic diastolic CHF.Serum creatinine 2.25 on 02/21/20. Serum creatinine noted to be 2.63 on 02/28/20. She was discharged home on furosemide 40 mg twice daily. Discharge weight was 122.6 kg(270lb).She followed up with cardiology in office on 02/28/20 and she was noted to be fluid overloaded. Weight on 02/28/20 was 257.2. Even though her weight was down from time of d/c she was felt to be clinically fluid overloaded and instructed to take lasix 80 mg bid x 4 days, then back to usual 40 mg bid. Upon presentation, the patient was noted to be fluid overloaded. Chest x-ray showed pulmonary vascular congestion. She was started on IV furosemide60 mg bid initially and was transitioned to lasix 40 IV bid with slow but good clinical response.  In the early wm 03/13/20, rapid response was called due to pt somnolence and hypoxia.  She was moved to SDU  Assessment/Plan: Acute on chronic diastolic CHF/cor pulmonale -Patient remains clinically fluid overloaded -endorses dietary and fluid indiscretions -Continue IV furosemide>>>transition to po when ok with renal -3/16 weight = 254.8lbs--NEG 12lbs -Accurate I's and O's -body habitus makes fluid status assessment difficult -appreciate cardiology/nephrology   Intake/Output Summary (Last 24  hours) at 03/15/2020 1257 Last data filed at 03/15/2020 1200 Gross per 24 hour  Intake 103 ml  Output 3500 ml  Net -3397 ml   Acute metabolic encephalopathy -06/29/15 AM--per RN reports, pt found somnolent and hypoxic -apparently then became agitated and given ativan at 0620 -3/15 ABG--7.319/19/137/ 14 (0.6) -CBG 54-->d50W-->no response (repeat CBG 107) -narcan x 1-->mild response with pt intermittently following commands short time -CT brain--neg -blood cultures x 2 -repeat UA--no pyuria -ammonia--26 -placed on BiPAP>>>>now off last 24 hours, stable on 3L -3/15--transfer to SDU -03/14/20--more awake and alert, near baseline -likely due to opioids/hynotics-->d/c  Coagulopathy -INR 6.2 -vitamin K IV x 1>>>INR 2.3 -likely due to cor pulmonale and chronic hepatic congestion -Follow INR  Bacteremia - CONTAMINANT    -3/15 Blood culture--GPC one of 2 sets -likely contaminant  - DC VANCOMYCIN -03/14/20 - general surgery changed out femoral central line to opposite side on 03/14/20  CKD stage IV -Baseline creatinine 2.2-2.6 -will need to tolerate worsening renal function for improved respiratory status -appreciate nephrology consult--discussed with Dr. Royce Macadamia 3/16-->lasix held 3/15, redose IV 80 mg on 3/16  Hyperkalemia -give bicarbonate x 1 -Calcium gluconate -duoneb -improved  COPD exacerbation -Continue Pulmicort -Continue duo nebs -continue IV Solu-Medrol>>>po prednisone>>>stopped -finished 5 daysazithromycin  Sepsis -present on admission -secondary to UTI -wbc 17.8 with lactate 2.7 initially -finished course of ceftriaxone -sepsis physiology resolved  [place]coi UTI -UA>50 WBC -finished 6 days ceftriaxone  Acute on Chronic respiratory failure with hypoxia -Patient is chronically on 3 L nasal cannula -placed on BiPAP 3/15 due to AMS with hypoxia, likely hypoventilation -3/16--now stable on 3L  Thrombocytopenia -Has been chronic, likely secondary to hepatic  congestion from the patient's cor  pulmonale  Paroxysmal atrial fibrillation,with RVR -Restart metoprolol-->increased to50mg  bid -d/crivaroxaban due to worsen renal function -start apixabanonce INR subtherapeutic -transitioned to IV lopressor until able to take po -IV diltiazem added 3/15 evening-->transitioned to po  Diabetes mellitus type 2 -NovoLog sliding scale -Holding Jardiance and Amaryl -02/15/20 A1C--7.5  Hyponatremia - Improving with diuresis -in part due to fluid overload -Follow  Tobacco abuse -Tobacco cessation discussed -nicoderm patch  Chronic leg pain -Restart home dose hydrocodone -Venous duplex--neg for DVT  Morbid Obesity -BMI 42.17 -lifestyle modification   Disposition Plan: Patient From: Home D/C Place:SNF Barriers: Not Clinically Stable--AMS, remains in afib RVR, nephrology following  Family Communication:Daughter updated 3/16   Consultants:none  Code Status: FULL   DVT Prophylaxis:pt continues to have supratherapeutic INR   Procedures: As Listed in Progress Note Above  Antibiotics: Vanc/cefepime 3/15>>> ceftriaxone 3/9>>3/15 azithro 3/9>>>3/13   Subjective: Patient reports she is slowly starting to feel better.  Objective: Vitals:   03/15/20 1148 03/15/20 1200 03/15/20 1215 03/15/20 1254  BP: 103/66 110/83 120/84 104/86  Pulse:  99 92 95  Resp:  19 20 20   Temp:      TempSrc:      SpO2:  96% 95% 99%  Weight:      Height:        Intake/Output Summary (Last 24 hours) at 03/15/2020 1255 Last data filed at 03/15/2020 1200 Gross per 24 hour  Intake 103 ml  Output 3500 ml  Net -3397 ml   Weight change: -0.4 kg Exam:   General:  Chronically ill appearing awake, alert, NAD.   HEENT: No icterus, No thrush, No neck mass, [place]/AT  Cardiovascular: IRRR, S1/S2, no rubs, no gallops  Respiratory: crackles heard bilateral. No wheeze  Abdomen: Soft/+BS, non tender, non distended, no  guarding  Extremities: 1+ edema BLEs  Data Reviewed: I have personally reviewed following labs and imaging studies Basic Metabolic Panel: Recent Labs  Lab 03/12/20 0633 03/13/20 0452 03/13/20 0916 03/14/20 0401 03/15/20 0341  NA 133* 134* 134* 138  138 139  138  K 4.8 5.3* 5.2* 4.7  4.6 3.7  3.7  CL 96* 96* 96* 99  99 102  102  CO2 20* 14* 13* 21*  21* 24  23  GLUCOSE 74 97 121* 120*  120* 104*  102*  BUN 89* 97* 101* 112*  112* 99*  98*  CREATININE 2.50* 2.92* 3.02* 2.73*  2.73* 2.11*  2.13*  CALCIUM 9.0 9.0 9.1 8.6*  8.5* 8.3*  8.1*  PHOS  --   --   --  6.2* 4.0   Liver Function Tests: Recent Labs  Lab 03/10/20 0450 03/13/20 0452 03/14/20 0401 03/15/20 0341  AST 22 33 44* 39  ALT 71* 56* 61* 56*  ALKPHOS 286* 278* 220* 202*  BILITOT 5.1* 9.2* 10.6* 8.7*  PROT 5.4* 5.3* 4.8* 4.8*  ALBUMIN 2.5* 2.5* 2.4*  2.4* 2.4*  2.4*   No results for input(s): LIPASE, AMYLASE in the last 168 hours. Recent Labs  Lab 03/13/20 0916  AMMONIA 26   Coagulation Profile: Recent Labs  Lab 03/11/20 0610 03/12/20 0633 03/13/20 0452 03/14/20 0401 03/15/20 0341  INR 3.1* 4.8* 6.2* 2.3* 1.5*   CBC: Recent Labs  Lab 03/12/20 1010 03/13/20 0916  WBC 10.5 14.1*  HGB 13.7 15.0  HCT 47.4* 51.9*  MCV 94.6 94.7  PLT 73* 83*   Cardiac Enzymes: No results for input(s): CKTOTAL, CKMB, CKMBINDEX, TROPONINI in the last 168 hours. BNP: Invalid input(s): POCBNP CBG: Recent Labs  Lab 03/14/20 1144 03/14/20 1705 03/14/20 2119 03/15/20 0734 03/15/20 1115  GLUCAP 112* 131* 111* 86 123*   HbA1C: No results for input(s): HGBA1C in the last 72 hours. Urine analysis:    Component Value Date/Time   COLORURINE YELLOW 03/13/2020 1200   APPEARANCEUR HAZY (A) 03/13/2020 1200   LABSPEC >1.030 (H) 03/13/2020 1200   PHURINE 5.0 03/13/2020 1200   GLUCOSEU 100 (A) 03/13/2020 1200   HGBUR LARGE (A) 03/13/2020 1200   BILIRUBINUR MODERATE (A) 03/13/2020 1200   KETONESUR  NEGATIVE 03/13/2020 1200   PROTEINUR 30 (A) 03/13/2020 1200   UROBILINOGEN 0.2 08/03/2013 2205   NITRITE NEGATIVE 03/13/2020 1200   LEUKOCYTESUR TRACE (A) 03/13/2020 1200    Recent Results (from the past 240 hour(s))  Urine Culture     Status: Abnormal   Collection Time: 03/07/20 11:07 AM   Specimen: Urine, Random  Result Value Ref Range Status   Specimen Description   Final    URINE, RANDOM Performed at [facility], [address] [place], [place] [address]    Special Requests   Final    NONE Performed at [facility], [address], [place], [place] [address]    Culture >=100,000 COLONIES/mL ESCHERICHIA COLI (A)  Final   Report Status 03/09/2020 FINAL  Final   Organism ID, Bacteria ESCHERICHIA COLI (A)  Final      Susceptibility   Escherichia coli - MIC*    AMPICILLIN >=32 RESISTANT Resistant     CEFAZOLIN <=4 SENSITIVE Sensitive     CEFTRIAXONE <=0.25 SENSITIVE Sensitive     CIPROFLOXACIN <=0.25 SENSITIVE Sensitive     GENTAMICIN <=1 SENSITIVE Sensitive     IMIPENEM <=0.25 SENSITIVE Sensitive     NITROFURANTOIN <=16 SENSITIVE Sensitive     TRIMETH/SULFA <=20 SENSITIVE Sensitive     AMPICILLIN/SULBACTAM >=32 RESISTANT Resistant     PIP/TAZO <=4 SENSITIVE Sensitive     * >=100,000 COLONIES/mL ESCHERICHIA COLI  Respiratory Panel by RT PCR (Flu A&B, Covid) - Nasopharyngeal Swab     Status: None   Collection Time: 03/07/20 11:53 AM   Specimen: Nasopharyngeal Swab  Result Value Ref Range Status   SARS Coronavirus 2 by RT PCR NEGATIVE NEGATIVE Final    Comment: (NOTE) SARS-CoV-2 target nucleic acids are NOT DETECTED. The SARS-CoV-2 RNA is generally detectable in upper respiratoy specimens during the acute phase of infection. The lowest concentration of SARS-CoV-2 viral copies this assay can detect is 131 copies/mL. A negative result does not preclude SARS-Cov-2 infection and should not be used as the sole basis for treatment or other patient management decisions. A  negative result may occur with  improper specimen collection/handling, submission of specimen other than nasopharyngeal swab, presence of viral mutation(s) within the areas targeted by this assay, and inadequate number of viral copies (<131 copies/mL). A negative result must be combined with clinical observations, patient history, and epidemiological information. The expected result is Negative. Fact Sheet for Patients:  PinkCheek.be Fact Sheet for Healthcare Providers:  GravelBags.it This test is not yet ap proved or cleared by the [place] FDA and  has been authorized for detection and/or diagnosis of SARS-CoV-2 by FDA under an Emergency Use Authorization (EUA). This EUA will remain  in effect (meaning this test can be used) for the duration of the COVID-19 declaration under Section 564(b)(1) of the Act, 21 U.S.C. section 360bbb-3(b)(1), unless the authorization is terminated or revoked sooner.    Influenza A by PCR NEGATIVE NEGATIVE Final   Influenza B by PCR  NEGATIVE NEGATIVE Final    Comment: (NOTE) The Xpert Xpress SARS-CoV-2/FLU/RSV assay is intended as an aid in  the diagnosis of influenza from Nasopharyngeal swab specimens and  should not be used as a sole basis for treatment. Nasal washings and  aspirates are unacceptable for Xpert Xpress SARS-CoV-2/FLU/RSV  testing. Fact Sheet for Patients: PinkCheek.be Fact Sheet for Healthcare Providers: GravelBags.it This test is not yet approved or cleared by the [place] FDA and  has been authorized for detection and/or diagnosis of SARS-CoV-2 by  FDA under an Emergency Use Authorization (EUA). This EUA will remain  in effect (meaning this test can be used) for the duration of the  Covid-19 declaration under Section 564(b)(1) of the Act, 21  U.S.C. section 360bbb-3(b)(1), unless the authorization is   terminated or revoked. Performed at [facility], [address], [place], [place] [address]   Culture, blood (Routine X 2) w Reflex to ID Panel     Status: None (Preliminary result)   Collection Time: 03/13/20  9:16 AM   Specimen: BLOOD RIGHT ARM  Result Value Ref Range Status   Specimen Description BLOOD RIGHT ARM  Final   Special Requests   Final    BOTTLES DRAWN AEROBIC AND ANAEROBIC Blood Culture adequate volume   Culture   Final    NO GROWTH 2 DAYS Performed at [facility], [address] [place], [place] [address]    Report Status PENDING  Incomplete  Culture, blood (Routine X 2) w Reflex to ID Panel     Status: Abnormal (Preliminary result)   Collection Time: 03/13/20  9:16 AM   Specimen: BLOOD  Result Value Ref Range Status   Specimen Description   Final    BLOOD DRAWN BY IV THERAPY DRAWN BY RN Performed at [facility], [address] [place], [place] [address]    Special Requests   Final    BOTTLES DRAWN AEROBIC AND ANAEROBIC Blood Culture results may not be optimal due to an inadequate volume of blood received in culture bottles Performed at [facility], [address], [place], [place] [address]    Culture  Setup Time   Final    IN BOTH AEROBIC AND ANAEROBIC BOTTLES GRAM POSITIVE COCCI Gram Stain Report Called to,Read Back By and Verified With: S WADE,RN @1945  03/13/20 [facility] Performed at [facility], [address] [place], [place] [address]    Culture (A)  Final    STAPHYLOCOCCUS SPECIES (COAGULASE NEGATIVE) THE SIGNIFICANCE OF ISOLATING THIS ORGANISM FROM A SINGLE SET OF BLOOD CULTURES WHEN MULTIPLE SETS ARE DRAWN IS UNCERTAIN. PLEASE NOTIFY THE MICROBIOLOGY DEPARTMENT WITHIN ONE WEEK IF SPECIATION AND SENSITIVITIES ARE REQUIRED. Performed at [facility], [place] [address] [place], [place] [address]    Report Status PENDING  Incomplete  MRSA PCR Screening     Status: None   Collection Time: 03/13/20 11:59 AM   Specimen: Nasal Mucosa; Nasopharyngeal   Result Value Ref Range Status   MRSA by PCR NEGATIVE NEGATIVE Final    Comment:        The GeneXpert MRSA Assay (FDA approved for NASAL specimens only), is one component of a comprehensive MRSA colonization surveillance program. It is not intended to diagnose MRSA infection nor to guide or monitor treatment for MRSA infections. Performed at [facility], [address], [place], [place] [address]   Culture, Urine     Status: None   Collection Time: 03/13/20 12:00 PM   Specimen: Urine, Clean Catch  Result Value  Ref Range Status   Specimen Description   Final    URINE, CLEAN CATCH Performed at [facility], [address] [place], [place] [address]    Special Requests   Final    NONE Performed at [facility], [address] [place], [place] [address]    Culture   Final    NO GROWTH Performed at [facility], [place] [address], [place], [place] [address]    Report Status 03/14/2020 FINAL  Final     Scheduled Meds: . apixaban  5 mg Oral BID  . budesonide (PULMICORT) nebulizer solution  0.25 mg Nebulization BID  . Chlorhexidine Gluconate Cloth  6 each Topical Daily  . Chlorhexidine Gluconate Cloth  6 each Topical Q0600  . diltiazem  30 mg Oral Q6H  . insulin aspart  0-9 Units Subcutaneous TID WC  . ipratropium-albuterol  3 mL Nebulization TID  . metoprolol tartrate  5 mg Intravenous Q6H  . naLOXone (NARCAN)  injection  0.4 mg Intravenous Once  . nicotine  21 mg Transdermal Daily  . sodium chloride flush  10-40 mL Intracatheter Q12H  . sodium chloride flush  3 mL Intravenous Q12H   Continuous Infusions: . sodium chloride    . ceFEPime (MAXIPIME) IV 2 g (03/15/20 0958)    Procedures/Studies: CT HEAD WO CONTRAST  Result Date: 03/13/2020 CLINICAL DATA:  Encephalopathy EXAM: CT HEAD WITHOUT CONTRAST TECHNIQUE: Contiguous axial images were obtained from the base of the skull through the vertex without intravenous contrast. COMPARISON:  02/24/2020 FINDINGS: Brain:  There is no acute intracranial hemorrhage, mass effect, or edema. Gray-white differentiation is preserved. There is no extra-axial fluid collection. Ventricles and sulci are within normal limits in size and configuration. Patchy hypoattenuation in the supratentorial white matter is nonspecific but likely reflects reflects stable ischemic changes. 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 Vascular: There is mild atherosclerotic calcification at the skull base. Skull: Calvarium is unremarkable. Sinuses/Orbits: No acute finding. Other: None. IMPRESSION: No acute intracranial abnormality. Stable chronic findings detailed above. Electronically Signed   By: Macy Mis M.D.   On: 03/13/2020 11:11   US Venous Img Lower Bilateral (DVT)  Result Date: 03/08/2020 CLINICAL DATA:  Chronic bilateral lower extremity edema, worsened during the past week. History of previous right lower extremity DVT (femoral and popliteal veins). History of smoking. Evaluate for DVT. EXAM: BILATERAL LOWER EXTREMITY VENOUS DOPPLER ULTRASOUND TECHNIQUE: Gray-scale sonography with graded compression, as well as color Doppler and duplex ultrasound were performed to evaluate the lower extremity deep venous systems from the level of the common femoral vein and including the common femoral, femoral, profunda femoral, popliteal and calf veins including the posterior tibial, peroneal and gastrocnemius veins when visible. The superficial great saphenous vein was also interrogated. Spectral Doppler was utilized to evaluate flow at rest and with distal augmentation maneuvers in the common femoral, femoral and popliteal veins. COMPARISON:  Bilateral lower extremity venous Doppler ultrasound-09/22/2017 (negative). FINDINGS: Examination is degraded due to patient body habitus and poor sonographic window RIGHT LOWER EXTREMITY Common Femoral Vein: No evidence of thrombus. Normal  compressibility, respiratory phasicity and response to augmentation. Saphenofemoral Junction: No evidence of thrombus. Normal compressibility and flow on color Doppler imaging. Profunda Femoral Vein: No evidence of thrombus. Normal compressibility and flow on color Doppler imaging. Femoral Vein: Appears patent where imaged. Popliteal Vein: No evidence of acute or chronic thrombus. Normal compressibility, respiratory phasicity and response to augmentation. Calf  Veins: Appear patent where imaged. Superficial Great Saphenous Vein: No evidence of thrombus. Normal compressibility. Venous Reflux:  None. Other Findings:  None. LEFT LOWER EXTREMITY Common Femoral Vein: No evidence of thrombus. Normal compressibility, respiratory phasicity and response to augmentation. Saphenofemoral Junction: No evidence of thrombus. Normal compressibility and flow on color Doppler imaging. Profunda Femoral Vein: No evidence of thrombus. Normal compressibility and flow on color Doppler imaging. Femoral Vein: Appears patent where imaged. Popliteal Vein: No evidence of thrombus. Normal compressibility, respiratory phasicity and response to augmentation. Calf Veins: Appear patent where imaged. Superficial Great Saphenous Vein: No evidence of thrombus. Normal compressibility. Venous Reflux:  None. Other Findings:  None. IMPRESSION: No evidence of acute or chronic DVT within either lower extremity on this body habitus degraded examination. Electronically Signed   By: Sandi Mariscal M.D.   On: 03/08/2020 16:21   DG Chest Port 1 View  Result Date: 03/13/2020 CLINICAL DATA:  Dyspnea, chronic respiratory failure, worsening lower extremity edema, generalized weakness and dyspnea on exertion EXAM: PORTABLE CHEST 1 VIEW COMPARISON:  Radiograph 03/07/2020, CT 03/21/2015 FINDINGS: There is cardiomegaly with cephalized vascularity. Tortuosity of the brachiocephalic vessels along the right upper mediastinum is similar to priors. Few bandlike opacities in  the mid lungs may reflect areas of subsegmental atelectasis. No consolidative opacity, pneumothorax or effusion is seen. No acute osseous or soft tissue abnormality. IMPRESSION: Cardiomegaly and vascular congestion may reflect features of heart failure. Bandlike opacities, likely subsegmental atelectasis. Electronically Signed   By: Lovena Le M.D.   On: 03/13/2020 05:52   DG Chest Port 1 View  Result Date: 03/07/2020 CLINICAL DATA:  Shortness of breath EXAM: PORTABLE CHEST 1 VIEW COMPARISON:  02/17/2020 FINDINGS: Stable cardiomegaly. Calcific aortic knob. Mild vascular congestion and subtly increased interstitial markings bilaterally. Linear atelectasis within the peripheral aspect of the left mid lung. The visualized skeletal structures are unremarkable. IMPRESSION: Cardiomegaly with mild pulmonary vascular congestion and subtly increased interstitial markings suggesting mild edema. Electronically Signed   By: Davina Poke D.O.   On: 03/07/2020 12:29   DG Chest Port 1 View  Result Date: 02/17/2020 CLINICAL DATA:  Shortness of breath EXAM: PORTABLE CHEST 1 VIEW COMPARISON:  February 14, 2020 FINDINGS: There is scarring in the right base region. There is mild atelectasis in the left base. There is stable cardiomegaly with pulmonary venous hypertension. No adenopathy. There is aortic atherosclerosis. No bone lesions. IMPRESSION: Cardiomegaly with pulmonary vascular congestion. Mild scarring right base. Mild atelectasis left base. No appreciable pulmonary edema. Aortic Atherosclerosis (ICD10-I70.0). Electronically Signed   By: Lowella Grip III M.D.   On: 02/17/2020 07:46   DG Chest Port 1 View  Result Date: 02/14/2020 CLINICAL DATA:  Shortness of breath and fluid retention. EXAM: PORTABLE CHEST 1 VIEW COMPARISON:  PA and lateral chest 02/08/2020 and 05/20/2019. FINDINGS: There is marked cardiomegaly and pulmonary vascular congestion. No consolidative process, pneumothorax or effusion.  Atherosclerosis is noted. No acute or focal bony abnormality. IMPRESSION: Cardiomegaly and vascular congestion. Atherosclerosis. Electronically Signed   By: Inge Rise M.D.   On: 02/14/2020 17:45   ECHOCARDIOGRAM COMPLETE  Result Date: 02/16/2020    ECHOCARDIOGRAM REPORT   Patient Name:   LEOCADIA IDLEMAN Date of Exam: 02/16/2020 Medical Rec #:  [id number]             Height:       66.0 in Accession #:    [id number]            Weight:  266.1 lb Date of Birth:  04-08-1961            BSA:          2.26 m Patient Age:    9 years              BP:           126/89 mmHg Patient Gender: F                     HR:           61 bpm. Exam Location:  [facility] Procedure: 2D Echo Indications:    Dyspnea 786.09 / R06.00  History:        Patient has prior history of Echocardiogram examinations, most                 recent 09/22/2017. CHF, Stroke and COPD; Risk Factors:Current                 Smoker, Diabetes and Hypertension. Crack cocaine use, RBBB,                 Acute renal failure , LVH , Acute respiratory failure with                 hypoxia.  Sonographer:    Leavy Cella RDCS (AE) Referring Phys: [id number] COURAGE EMOKPAE IMPRESSIONS  1. Left ventricular ejection fraction, by estimation, is 55 to 60%. The left ventricle has normal function. The left ventricle has no regional wall motion abnormalities. There is mild concentric left ventricular hypertrophy. Left ventricular diastolic parameters are indeterminate. Elevated left ventricular end-diastolic pressure.  2. Right ventricular systolic function is severely reduced. The right ventricular size is moderately enlarged. mildly increased right ventricular wall thickness. There is moderately elevated pulmonary artery systolic pressure.  3. Left atrial size was severely dilated.  4. Right atrial size was severely dilated.  5. The mitral valve is grossly normal. Mild mitral valve regurgitation.  6. Tricuspid valve regurgitation is moderate.  7. The aortic  valve is tricuspid. Aortic valve regurgitation is not visualized. No aortic stenosis is present.  8. The inferior vena cava is dilated in size with >50% respiratory variability, suggesting right atrial pressure of 8 mmHg. FINDINGS  Left Ventricle: Left ventricular ejection fraction, by estimation, is 55 to 60%. The left ventricle has normal function. The left ventricle has no regional wall motion abnormalities. The left ventricular internal cavity size was normal in size. There is  mild concentric left ventricular hypertrophy. Left ventricular diastolic parameters are indeterminate. Elevated left ventricular end-diastolic pressure. Right Ventricle: The right ventricular size is moderately enlarged. Mildly increased right ventricular wall thickness. Right ventricular systolic function is severely reduced. There is moderately elevated pulmonary artery systolic pressure. The tricuspid  regurgitant velocity is 3.12 m/s, and with an assumed right atrial pressure of 10 mmHg, the estimated right ventricular systolic pressure is 78.2 mmHg. Left Atrium: Left atrial size was severely dilated. Right Atrium: Right atrial size was severely dilated. Pericardium: There is no evidence of pericardial effusion. Mitral Valve: The mitral valve is grossly normal. Mild mitral annular calcification. Mild mitral valve regurgitation. Tricuspid Valve: The tricuspid valve is grossly normal. Tricuspid valve regurgitation is moderate. Aortic Valve: The aortic valve is tricuspid. Aortic valve regurgitation is not visualized. No aortic stenosis is present. Pulmonic Valve: The pulmonic valve was grossly normal. Pulmonic valve regurgitation is not visualized. Aorta: The aortic root is normal in size and structure. Venous: The  inferior vena cava is dilated in size with greater than 50% respiratory variability, suggesting right atrial pressure of 8 mmHg. IAS/Shunts: No atrial level shunt detected by color flow Doppler.  LEFT VENTRICLE PLAX 2D LVIDd:          4.54 cm  Diastology LVIDs:         2.77 cm  LV e' lateral:   11.20 cm/s LV PW:         1.40 cm  LV E/e' lateral: 7.8 LV IVS:        1.16 cm  LV e' medial:    4.24 cm/s LVOT diam:     2.10 cm  LV E/e' medial:  20.7 LV SV Index:   26.96 LVOT Area:     3.46 cm  RIGHT VENTRICLE RV S prime:     8.05 cm/s LEFT ATRIUM              Index       RIGHT ATRIUM           Index LA diam:        4.40 cm  1.95 cm/m  RA Area:     41.10 cm LA Vol (A2C):   143.0 ml 63.34 ml/m RA Volume:   176.00 ml 77.95 ml/m LA Vol (A4C):   99.1 ml  43.89 ml/m LA Biplane Vol: 121.0 ml 53.59 ml/m   AORTA Ao Root diam: 3.40 cm MITRAL VALVE               TRICUSPID VALVE MV Area (PHT): 4.06 cm    TR Peak grad:   38.9 mmHg MV Decel Time: 187 msec    TR Vmax:        312.00 cm/s MR Peak grad: 62.1 mmHg MR Vmax:      394.00 cm/s  SHUNTS MV E velocity: 87.80 cm/s  Systemic Diam: 2.10 cm MV A velocity: 47.50 cm/s MV E/A ratio:  1.85 Kate Sable MD Electronically signed by Kate Sable MD Signature Date/Time: 02/16/2020/4:32:20 PM    Final    US Abdomen Limited RUQ  Result Date: 03/07/2020 CLINICAL DATA:  Right upper quadrant pain and elevated LFTs EXAM: ULTRASOUND ABDOMEN LIMITED RIGHT UPPER QUADRANT COMPARISON:  02/16/2020 FINDINGS: Gallbladder: No gallstones or wall thickening visualized. No sonographic Murphy sign noted by sonographer. Common bile duct: Diameter: 4 mm Liver: No focal lesion identified. Within normal limits in parenchymal echogenicity. Portal vein is patent on color Doppler imaging with normal direction of blood flow towards the liver. Other: None. IMPRESSION: No acute abnormality noted. Electronically Signed   By: Inez Catalina M.D.   On: 03/07/2020 15:05   US Abdomen Limited RUQ  Result Date: 02/16/2020 CLINICAL DATA:  Bilirubinemia EXAM: ULTRASOUND ABDOMEN LIMITED RIGHT UPPER QUADRANT COMPARISON:  None. FINDINGS: Gallbladder: No gallstones or wall thickening visualized. No sonographic Murphy sign noted by  sonographer. Common bile duct: Diameter: 3 mm Liver: No focal lesion identified. Within normal limits in parenchymal echogenicity. Portal vein is patent on color Doppler imaging with normal direction of blood flow towards the liver. IMPRESSION: Negative right upper quadrant ultrasound. Electronically Signed   By: Monte Fantasia M.D.   On: 02/16/2020 10:40   Aquarius Tremper Wynetta Emery, MD  How to contact the [facility] Attending or Consulting provider [place] or covering provider during after hours [place], for this patient?  1. Check the care team in [facility] and look for a) attending/consulting [facility] provider listed and b) the [facility] team listed 2. Log into [email or web address]  and use [place]'s universal password to access. If you do not have the password, please contact the hospital operator. 3. Locate the [facility] provider you are looking for under [facility] and page to a number that you can be directly reached. 4. If you still have difficulty reaching the provider, please page the [facility] (Director on Call) for the Hospitalists listed on amion for assistance.   If 7PM-7AM, please contact night-coverage [email or web address] Password [facility] 03/15/2020, 12:55 PM   LOS: 8 days

## 2020-03-16 LAB — RENAL FUNCTION PANEL
Albumin: 2.5 g/dL — ABNORMAL LOW (ref 3.5–5.0)
Anion gap: 12 (ref 5–15)
BUN: 76 mg/dL — ABNORMAL HIGH (ref 6–20)
CO2: 26 mmol/L (ref 22–32)
Calcium: 8.4 mg/dL — ABNORMAL LOW (ref 8.9–10.3)
Chloride: 100 mmol/L (ref 98–111)
Creatinine, Ser: 1.54 mg/dL — ABNORMAL HIGH (ref 0.44–1.00)
GFR calc Af Amer: 42 mL/min — ABNORMAL LOW (ref >60)
GFR calc non Af Amer: 37 mL/min — ABNORMAL LOW (ref >60)
Glucose, Bld: 120 mg/dL — ABNORMAL HIGH (ref 70–99)
Phosphorus: 2.8 mg/dL (ref 2.5–4.6)
Potassium: 3.5 mmol/L (ref 3.5–5.1)
Sodium: 138 mmol/L (ref 135–145)

## 2020-03-16 LAB — PROTIME-INR
INR: 1.6 — ABNORMAL HIGH (ref 0.8–1.2)
Prothrombin Time: 19 seconds — ABNORMAL HIGH (ref 11.4–15.2)

## 2020-03-16 LAB — GLUCOSE, CAPILLARY
Glucose-Capillary: 138 mg/dL — ABNORMAL HIGH (ref 70–99)
Glucose-Capillary: 102 mg/dL — ABNORMAL HIGH (ref 70–99)
Glucose-Capillary: 154 mg/dL — ABNORMAL HIGH (ref 70–99)
Glucose-Capillary: 128 mg/dL — ABNORMAL HIGH (ref 70–99)
Glucose-Capillary: 123 mg/dL — ABNORMAL HIGH (ref 70–99)

## 2020-03-16 LAB — MAGNESIUM: Magnesium: 1.9 mg/dL (ref 1.7–2.4)

## 2020-03-16 LAB — CULTURE, BLOOD (ROUTINE X 2)

## 2020-03-16 MED ORDER — FUROSEMIDE 80 MG PO TABS
80.0000 mg | ORAL_TABLET | Freq: Every day | ORAL | Status: DC
  Administered 2020-03-16 – 2020-03-17 (×2): 80 mg via ORAL
  Filled 2020-03-16 (×2): qty 1

## 2020-03-16 MED ORDER — METOPROLOL TARTRATE 5 MG/5ML IV SOLN: 5.0000 mg | Freq: Four times a day (QID) | INTRAVENOUS | Status: DC | PRN

## 2020-03-16 MED ORDER — METOPROLOL TARTRATE 50 MG PO TABS
50.0000 mg | ORAL_TABLET | Freq: Two times a day (BID) | ORAL | Status: DC
  Administered 2020-03-16 – 2020-03-17 (×3): 50 mg via ORAL
  Filled 2020-03-16 (×3): qty 1

## 2020-03-16 NOTE — Evaluation (Signed)
Physical Therapy Evaluation Patient Details Name: Tracy [name] MRN: [id number] DOB: 1961/01/04 Today's Date: 03/16/2020   History of Present Illness  Tracy [name] is a 59 y.o. female with medical history significant of Right-sided heart failure, COPD, tobacco use, chronic respiratory failure on home oxygen, diabetes and hypertension, was recently discharged on 2/22 after being treated for decompensated CHF.  Patient has been under the care of her daughter who reports that she has been compliant with her medications.  Patient has had worsening lower extremity edema and increased weight gain for over a week now.  She is not had any nausea, vomiting or abdominal pain.  Her p.o. intake has been poor since her last discharge.  She does become short of breath on exertion, but is not particularly worse than her baseline.  She was noted to be hypoglycemic and had altered mental status earlier today and was brought to the hospital for evaluation.  She has been afebrile.  Her daughter reports that her urine is quite dark.  She is noticed that she has been sleeping more lately and has been more somnolent.    Clinical Impression  Patient requires much time and frequent rest breaks to complete functional tasks, had difficulty with sit to stands and taking steps at bedside due to posterior left knee pain, BLE weakness and difficulty flexing left knee.  Patient tolerated sitting up in chair for approximately 2 hours before put back to bed.  Patient will benefit from continued physical therapy in hospital and recommended venue below to increase strength, balance, endurance for safe ADLs and gait.    Follow Up Recommendations SNF    Equipment Recommendations  None recommended by PT    Recommendations for Other Services       Precautions / Restrictions Precautions Precautions: Fall Restrictions Weight Bearing Restrictions: No      Mobility  Bed Mobility Overal bed mobility: Needs  Assistance Bed Mobility: Supine to Sit;Sit to Supine     Supine to sit: HOB elevated;Mod assist Sit to supine: Mod assist   General bed mobility comments: slow labord movement for supine to sitting with head of bed partially raised, required assistance move legs back onto bed during sit to supine  Transfers Overall transfer level: Needs assistance Equipment used: Rolling walker (2 wheeled) Transfers: Sit to/from [facility] Sit to Stand: Mod assist Stand pivot transfers: Mod assist       General transfer comment: unsteady on feet, labord movement, increased time  Ambulation/Gait Ambulation/Gait assistance: Mod assist;Max assist Gait Distance (Feet): 3 Feet Assistive device: Rolling walker (2 wheeled) Gait Pattern/deviations: Decreased step length - right;Decreased step length - left;Decreased stride length Gait velocity: slow   General Gait Details: limited to 3-4 slow unsteady labored steps at bedside with diffiuclty advancing LLE due to weakness, posterior left knee pain  Stairs            Wheelchair Mobility    Modified Rankin (Stroke Patients Only)       Balance Overall balance assessment: Needs assistance Sitting-balance support: Feet supported;No upper extremity supported Sitting balance-Leahy Scale: Fair Sitting balance - Comments: fair/good seated at EOB   Standing balance support: During functional activity;Bilateral upper extremity supported   Standing balance comment: fair/poor using RW                             Pertinent Vitals/Pain Pain Assessment: 0-10 Pain Score: 8  Pain Location: back of left  knee Pain Descriptors / Indicators: Sore;Grimacing;Guarding;Aching Pain Intervention(s): Limited activity within patient's tolerance;Monitored during session;Repositioned    Home Living Family/patient expects to be discharged to:: Private residence Living Arrangements: Alone Available Help at Discharge: Family;Available  PRN/intermittently Type of Home: House Home Access: Stairs to enter Entrance Stairs-Rails: Right;Left;Can reach both Entrance Stairs-Number of Steps: 4 Home Layout: One level Home Equipment: Cane - quad;Walker - 4 wheels;Shower seat      Prior Function Level of Independence: Independent with assistive device(s);Needs assistance   Gait / Transfers Assistance Needed: uses quad cane, limited household ambulator  ADL's / Homemaking Assistance Needed: needs assist from daughter  Comments: household and short distanced community ambulator with quad-cane prior to last admission in Feb 2021, requires assist and has limited mobility at home since discharge     Hand Dominance   Dominant Hand: Left    Extremity/Trunk Assessment   Upper Extremity Assessment Upper Extremity Assessment: Generalized weakness    Lower Extremity Assessment Lower Extremity Assessment: Generalized weakness    Cervical / Trunk Assessment Cervical / Trunk Assessment: Normal  Communication   Communication: No difficulties  Cognition Arousal/Alertness: Awake/alert Behavior During Therapy: WFL for tasks assessed/performed Overall Cognitive Status: Within Functional Limits for tasks assessed                                        General Comments      Exercises     Assessment/Plan    PT Assessment Patient needs continued PT services  PT Problem List Decreased strength;Decreased activity tolerance;Decreased balance;Decreased mobility       PT Treatment Interventions Gait training;Balance training;Stair training;Functional mobility training;Therapeutic activities;Therapeutic exercise;Patient/family education    PT Goals (Current goals can be found in the Care Plan section)  Acute Rehab PT Goals Patient Stated Goal: Return home PT Goal Formulation: With patient Time For Goal Achievement: 03/24/20 Potential to Achieve Goals: Good    Frequency Min 3X/week   Barriers to discharge         Co-evaluation               AM-PAC PT "6 Clicks" Mobility  Outcome Measure Help needed turning from your back to your side while in a flat bed without using bedrails?: A Little Help needed moving from lying on your back to sitting on the side of a flat bed without using bedrails?: A Lot Help needed moving to and from a bed to a chair (including a wheelchair)?: A Lot Help needed standing up from a chair using your arms (e.g., wheelchair or bedside chair)?: A Lot Help needed to walk in hospital room?: A Lot Help needed climbing 3-5 steps with a railing? : Total 6 Click Score: 12    End of Session Equipment Utilized During Treatment: Oxygen Activity Tolerance: Patient limited by fatigue;Patient limited by lethargy Patient left: in chair;with call bell/phone within reach;with chair alarm set Nurse Communication: Mobility status PT Visit Diagnosis: Unsteadiness on feet (R26.81);Other abnormalities of gait and mobility (R26.89);Muscle weakness (generalized) (M62.81)    Time: 9983-3825 PT Time Calculation (min) (ACUTE ONLY): 35 min   Charges:   PT Evaluation $PT Eval Moderate Complexity: 1 Mod PT Treatments $Therapeutic Activity: 23-37 mins        12:08 PM, 03/16/20 Lonell Grandchild, MPT Physical Therapist with [facility] [phone] office [phone] mobile phone

## 2020-03-16 NOTE — Progress Notes (Signed)
PROGRESS NOTE  Tracy [name] ZOX:[id number] DOB: [birth year] DOA: 03/07/2020 PCP: Rory Percy, MD  Brief History: 59 year old female with a history of chronic respiratory failure on 3L,diastolic CHF, COPD, tobacco abuse, diabetes mellitus type 2, hypertension, remote cocaine use presenting with 1 week history of worsening lower extremity edema, generalized weakness, and dyspnea on exertion. The patient has had decreased oral intake. She was noted to have some confusion with hypoglycemia at home. As a result, the patient was brought for further evaluation. Patient had recent admission to the hospital from 02/15/2020 to 02/21/2020 for acute on chronic diastolic CHF.Serum creatinine 2.25 on 02/21/20. Serum creatinine noted to be 2.63 on 02/28/20. She was discharged home on furosemide 40 mg twice daily. Discharge weight was 122.6 kg(270lb).She followed up with cardiology in office on 02/28/20 and she was noted to be fluid overloaded. Weight on 02/28/20 was 257.2. Even though her weight was down from time of d/c she was felt to be clinically fluid overloaded and instructed to take lasix 80 mg bid x 4 days, then back to usual 40 mg bid. Upon presentation, the patient was noted to be fluid overloaded. Chest x-ray showed pulmonary vascular congestion. She was started on IV furosemide60 mg bid initially and was transitioned to lasix 40 IV bid with slow but good clinical response.  In the early wm 03/13/20, rapid response was called due to pt somnolence and hypoxia.  She was moved to SDU  Assessment/Plan: Acute on chronic diastolic CHF/cor pulmonale -Patient remains clinically fluid overloaded -endorses dietary and fluid indiscretions -Continue IV furosemide>>>transition to po when ok with renal -3/16 weight = 254.8lbs--NEG 12lbs -Accurate I's and O's -body habitus makes fluid status assessment difficult -appreciate cardiology/nephrology  Intake/Output Summary (Last 24  hours) at 03/16/2020 1608 Last data filed at 03/16/2020 8119 Gross per 24 hour  Intake 480 ml  Output 2400 ml  Net -1920 ml   Acute metabolic encephalopathy -1/47/82 AM--per RN reports, pt found somnolent and hypoxic -apparently then became agitated and given ativan at 0620 -3/15 ABG--7.319/19/137/ 14 (0.6) -CBG 54-->d50W-->no response (repeat CBG 107) -narcan x 1-->mild response with pt intermittently following commands short time -CT brain--neg -blood cultures x 2 -repeat UA--no pyuria -ammonia--26 -placed on BiPAP>>>>now off last 24 hours, stable on 3L -3/15--transfer to SDU -03/14/20--more awake and alert, near baseline -likely due to opioids/hynotics-->d/c  Coagulopathy -INR 6.2 -vitamin K IV x 1>>>INR 2.3 -likely due to cor pulmonale and chronic hepatic congestion -Follow INR  Bacteremia - CONTAMINANT    -3/15 Blood culture--GPC one of 2 sets -likely contaminant  - DC VANCOMYCIN -03/14/20 - general surgery changed out femoral central line to opposite side on 03/14/20 -Plan to remove central line 3/19 prior to DC  CKD stage IV -Baseline creatinine 2.2-2.6 -will need to tolerate worsening renal function for improved respiratory status -appreciate nephrology consult--lasix held 3/15, redose IV 80 mg on 3/16 -renal function stabilizing.  Restarted oral lasix 80 mg daily today 3/18  Hyperkalemia RESOLVED -give bicarbonate x 1 -Calcium gluconate x 1  COPD exacerbation -Continue Pulmicort -Continue duo nebs -continue IV Solu-Medrol>>>po prednisone>>>stopped -finished 5 daysazithromycin  Sepsis  RESOLVED -present on admission -secondary to UTI -wbc 17.8 with lactate 2.7 initially -finished course of ceftriaxone -sepsis physiology resolved  [place] UTI - TREATED -UA>50 WBC -finished 6 days ceftriaxone  Acute on Chronic respiratory failure with hypoxia -Patient is chronically on 3 L nasal cannula -placed on BiPAP 3/15 due to AMS with hypoxia,  likely  hypoventilation -3/16--now stable on 3L  Thrombocytopenia -Has been chronic, no bleeding seen   Paroxysmal atrial fibrillation,with RVR -Restart metoprolol-->increased to50mg  bid -d/crivaroxaban due to worsen renal function -start apixabanonce INR subtherapeutic -transitioned to IV lopressor until able to take po -IV diltiazem added 3/15 evening-->transitioned to po  Diabetes mellitus type 2 -NovoLog sliding scale -Holding Jardiance and Amaryl -02/15/20 A1C--7.5  Hyponatremia - Improving with diuresis -in part due to fluid overload -Follow  Tobacco abuse -Tobacco cessation discussed -nicoderm patch  Chronic leg pain -Restart home dose hydrocodone -Venous duplex--neg for DVT  Morbid Obesity -BMI 42.17 -lifestyle modification   Disposition Plan: Patient From: Home D/C Place:SNF Barriers: SNF authorization pending, hopeful to DC in AM to SNF  Family Communication:son visited today and was updated  Consultants:none  Code Status: FULL   DVT Prophylaxis:pt continues to have supratherapeutic INR   Procedures: As Listed in Progress Note Above  Antibiotics: Vanc/cefepime 3/15>>> ceftriaxone 3/9>>3/15 azithro 3/9>>>3/13   Subjective: Patient reports she would like to go home, no SOB, she is weak and agreeable to SNF.  Objective: Vitals:   03/16/20 1020 03/16/20 1222 03/16/20 1336 03/16/20 1434  BP:  105/77 98/76   Pulse: 66  90   Resp:   18   Temp:   (!) 97.5 F (36.4 C)   TempSrc:   Oral   SpO2:   98% 96%  Weight:      Height:        Intake/Output Summary (Last 24 hours) at 03/16/2020 1608 Last data filed at 03/16/2020 2355 Gross per 24 hour  Intake 480 ml  Output 2400 ml  Net -1920 ml   Weight change:  Exam:   General:  Chronically ill appearing awake, alert, NAD.   HEENT: No icterus, No thrush, No neck mass, /AT  Cardiovascular: IRRR, S1/S2, no rubs, no gallops  Respiratory: crackles heard bilateral. No  wheeze  Abdomen: Soft/+BS, non tender, non distended, no guarding  Extremities: 1+ edema BLEs  Data Reviewed: I have personally reviewed following labs and imaging studies Basic Metabolic Panel: Recent Labs  Lab 03/13/20 0452 03/13/20 0916 03/14/20 0401 03/15/20 0341 03/16/20 0702  NA 134* 134* 138  138 139  138 138  K 5.3* 5.2* 4.7  4.6 3.7  3.7 3.5  CL 96* 96* 99  99 102  102 100  CO2 14* 13* 21*  21* 24  23 26   GLUCOSE 97 121* 120*  120* 104*  102* 120*  BUN 97* 101* 112*  112* 99*  98* 76*  CREATININE 2.92* 3.02* 2.73*  2.73* 2.11*  2.13* 1.54*  CALCIUM 9.0 9.1 8.6*  8.5* 8.3*  8.1* 8.4*  MG  --   --   --   --  1.9  PHOS  --   --  6.2* 4.0 2.8   Liver Function Tests: Recent Labs  Lab 03/10/20 0450 03/13/20 0452 03/14/20 0401 03/15/20 0341 03/16/20 0702  AST 22 33 44* 39  --   ALT 71* 56* 61* 56*  --   ALKPHOS 286* 278* 220* 202*  --   BILITOT 5.1* 9.2* 10.6* 8.7*  --   PROT 5.4* 5.3* 4.8* 4.8*  --   ALBUMIN 2.5* 2.5* 2.4*  2.4* 2.4*  2.4* 2.5*   No results for input(s): LIPASE, AMYLASE in the last 168 hours. Recent Labs  Lab 03/13/20 0916  AMMONIA 26   Coagulation Profile: Recent Labs  Lab 03/12/20 0633 03/13/20 0452 03/14/20 0401 03/15/20 0341 03/16/20 0702  INR  4.8* 6.2* 2.3* 1.5* 1.6*   CBC: Recent Labs  Lab 03/12/20 1010 03/13/20 0916  WBC 10.5 14.1*  HGB 13.7 15.0  HCT 47.4* 51.9*  MCV 94.6 94.7  PLT 73* 83*   Cardiac Enzymes: No results for input(s): CKTOTAL, CKMB, CKMBINDEX, TROPONINI in the last 168 hours. BNP: Invalid input(s): POCBNP CBG: Recent Labs  Lab 03/15/20 1115 03/15/20 1609 03/16/20 0306 03/16/20 0723 03/16/20 1106  GLUCAP 123* 146* 138* 102* 154*   HbA1C: No results for input(s): HGBA1C in the last 72 hours. Urine analysis:    Component Value Date/Time   COLORURINE YELLOW 03/13/2020 1200   APPEARANCEUR HAZY (A) 03/13/2020 1200   LABSPEC >1.030 (H) 03/13/2020 1200   PHURINE 5.0  03/13/2020 1200   GLUCOSEU 100 (A) 03/13/2020 1200   HGBUR LARGE (A) 03/13/2020 1200   BILIRUBINUR MODERATE (A) 03/13/2020 1200   KETONESUR NEGATIVE 03/13/2020 1200   PROTEINUR 30 (A) 03/13/2020 1200   UROBILINOGEN 0.2 08/03/2013 2205   NITRITE NEGATIVE 03/13/2020 1200   LEUKOCYTESUR TRACE (A) 03/13/2020 1200    Recent Results (from the past 240 hour(s))  Urine Culture     Status: Abnormal   Collection Time: 03/07/20 11:07 AM   Specimen: Urine, Random  Result Value Ref Range Status   Specimen Description   Final    URINE, RANDOM Performed at [facility], [address] [place], [place] [address]    Special Requests   Final    NONE Performed at [facility], [address] [place], [place] [address]    Culture >=100,000 COLONIES/mL ESCHERICHIA COLI (A)  Final   Report Status 03/09/2020 FINAL  Final   Organism ID, Bacteria ESCHERICHIA COLI (A)  Final      Susceptibility   Escherichia coli - MIC*    AMPICILLIN >=32 RESISTANT Resistant     CEFAZOLIN <=4 SENSITIVE Sensitive     CEFTRIAXONE <=0.25 SENSITIVE Sensitive     CIPROFLOXACIN <=0.25 SENSITIVE Sensitive     GENTAMICIN <=1 SENSITIVE Sensitive     IMIPENEM <=0.25 SENSITIVE Sensitive     NITROFURANTOIN <=16 SENSITIVE Sensitive     TRIMETH/SULFA <=20 SENSITIVE Sensitive     AMPICILLIN/SULBACTAM >=32 RESISTANT Resistant     PIP/TAZO <=4 SENSITIVE Sensitive     * >=100,000 COLONIES/mL ESCHERICHIA COLI  Respiratory Panel by RT PCR (Flu A&B, Covid) - Nasopharyngeal Swab     Status: None   Collection Time: 03/07/20 11:53 AM   Specimen: Nasopharyngeal Swab  Result Value Ref Range Status   SARS Coronavirus 2 by RT PCR NEGATIVE NEGATIVE Final    Comment: (NOTE) SARS-CoV-2 target nucleic acids are NOT DETECTED. The SARS-CoV-2 RNA is generally detectable in upper respiratoy specimens during the acute phase of infection. The lowest concentration of SARS-CoV-2 viral copies this assay can detect is 131 copies/mL. A negative result  does not preclude SARS-Cov-2 infection and should not be used as the sole basis for treatment or other patient management decisions. A negative result may occur with  improper specimen collection/handling, submission of specimen other than nasopharyngeal swab, presence of viral mutation(s) within the areas targeted by this assay, and inadequate number of viral copies (<131 copies/mL). A negative result must be combined with clinical observations, patient history, and epidemiological information. The expected result is Negative. Fact Sheet for Patients:  PinkCheek.be Fact Sheet for Healthcare Providers:  GravelBags.it This test is not yet ap proved or cleared by the [place] FDA and  has been authorized for detection and/or diagnosis of SARS-CoV-2 by  FDA under an Emergency Use Authorization (EUA). This EUA will remain  in effect (meaning this test can be used) for the duration of the COVID-19 declaration under Section 564(b)(1) of the Act, 21 U.S.C. section 360bbb-3(b)(1), unless the authorization is terminated or revoked sooner.    Influenza A by PCR NEGATIVE NEGATIVE Final   Influenza B by PCR NEGATIVE NEGATIVE Final    Comment: (NOTE) The Xpert Xpress SARS-CoV-2/FLU/RSV assay is intended as an aid in  the diagnosis of influenza from Nasopharyngeal swab specimens and  should not be used as a sole basis for treatment. Nasal washings and  aspirates are unacceptable for Xpert Xpress SARS-CoV-2/FLU/RSV  testing. Fact Sheet for Patients: PinkCheek.be Fact Sheet for Healthcare Providers: GravelBags.it This test is not yet approved or cleared by the [place] FDA and  has been authorized for detection and/or diagnosis of SARS-CoV-2 by  FDA under an Emergency Use Authorization (EUA). This EUA will remain  in effect (meaning this test can be used) for the duration of  the  Covid-19 declaration under Section 564(b)(1) of the Act, 21  U.S.C. section 360bbb-3(b)(1), unless the authorization is  terminated or revoked. Performed at [facility], [address] [place], [place] [address]   Culture, blood (Routine X 2) w Reflex to ID Panel     Status: None (Preliminary result)   Collection Time: 03/13/20  9:16 AM   Specimen: BLOOD RIGHT ARM  Result Value Ref Range Status   Specimen Description BLOOD RIGHT ARM  Final   Special Requests   Final    BOTTLES DRAWN AEROBIC AND ANAEROBIC Blood Culture adequate volume   Culture   Final    NO GROWTH 3 DAYS Performed at [facility], [address], [place], [place] [address]    Report Status PENDING  Incomplete  Culture, blood (Routine X 2) w Reflex to ID Panel     Status: Abnormal   Collection Time: 03/13/20  9:16 AM   Specimen: BLOOD  Result Value Ref Range Status   Specimen Description   Final    BLOOD DRAWN BY IV THERAPY DRAWN BY RN Performed at [facility], [address], [place], [place] [address]    Special Requests   Final    BOTTLES DRAWN AEROBIC AND ANAEROBIC Blood Culture results may not be optimal due to an inadequate volume of blood received in culture bottles Performed at [facility], [address], [place], [place] [address]    Culture  Setup Time   Final    IN BOTH AEROBIC AND ANAEROBIC BOTTLES GRAM POSITIVE COCCI Gram Stain Report Called to,Read Back By and Verified With: S WADE,RN @1945  03/13/20 [facility] Performed at [facility], [address], [place], [place] [address]    Culture (A)  Final    STAPHYLOCOCCUS SPECIES (COAGULASE NEGATIVE) THE SIGNIFICANCE OF ISOLATING THIS ORGANISM FROM A SINGLE SET OF BLOOD CULTURES WHEN MULTIPLE SETS ARE DRAWN IS UNCERTAIN. PLEASE NOTIFY THE MICROBIOLOGY DEPARTMENT WITHIN ONE WEEK IF SPECIATION AND SENSITIVITIES ARE REQUIRED. Performed at [facility], [place] [address], [place],  [address]    Report Status 03/16/2020 FINAL  Final  MRSA PCR  Screening     Status: None   Collection Time: 03/13/20 11:59 AM   Specimen: Nasal Mucosa; Nasopharyngeal  Result Value Ref Range Status   MRSA by PCR NEGATIVE NEGATIVE Final    Comment:        The GeneXpert MRSA Assay (FDA approved for NASAL specimens only), is one component of  a comprehensive MRSA colonization surveillance program. It is not intended to diagnose MRSA infection nor to guide or monitor treatment for MRSA infections. Performed at [facility], [address], [place], [place] [address]   Culture, Urine     Status: None   Collection Time: 03/13/20 12:00 PM   Specimen: Urine, Clean Catch  Result Value Ref Range Status   Specimen Description   Final    URINE, CLEAN CATCH Performed at [facility], [address] [place], [place] [address]    Special Requests   Final    NONE Performed at [facility], [address] [place], [place] [address]    Culture   Final    NO GROWTH Performed at [facility], [place] [address], [place], [place] [address]    Report Status 03/14/2020 FINAL  Final     Scheduled Meds: . apixaban  5 mg Oral BID  . budesonide (PULMICORT) nebulizer solution  0.25 mg Nebulization BID  . Chlorhexidine Gluconate Cloth  6 each Topical Daily  . Chlorhexidine Gluconate Cloth  6 each Topical Q0600  . diltiazem  30 mg Oral Q6H  . furosemide  80 mg Oral Daily  . insulin aspart  0-9 Units Subcutaneous TID WC  . ipratropium-albuterol  3 mL Nebulization TID  . metoprolol tartrate  50 mg Oral BID  . naLOXone (NARCAN)  injection  0.4 mg Intravenous Once  . nicotine  21 mg Transdermal Daily  . sodium chloride flush  10-40 mL Intracatheter Q12H  . sodium chloride flush  3 mL Intravenous Q12H   Continuous Infusions: . sodium chloride      Procedures/Studies: CT HEAD WO CONTRAST  Result Date: 03/13/2020 CLINICAL DATA:  Encephalopathy EXAM: CT HEAD WITHOUT CONTRAST TECHNIQUE: Contiguous axial images were obtained from the base of the skull through  the vertex without intravenous contrast. COMPARISON:  02/24/2020 FINDINGS: Brain: There is no acute intracranial hemorrhage, mass effect, or edema. Gray-white differentiation is preserved. There is no extra-axial fluid collection. Ventricles and sulci are within normal limits in size and configuration. Patchy hypoattenuation in the supratentorial white matter is nonspecific but likely reflects reflects stable ischemic changes. 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 Vascular: There is mild atherosclerotic calcification at the skull base. Skull: Calvarium is unremarkable. Sinuses/Orbits: No acute finding. Other: None. IMPRESSION: No acute intracranial abnormality. Stable chronic findings detailed above. Electronically Signed   By: Macy Mis M.D.   On: 03/13/2020 11:11   US Venous Img Lower Bilateral (DVT)  Result Date: 03/08/2020 CLINICAL DATA:  Chronic bilateral lower extremity edema, worsened during the past week. History of previous right lower extremity DVT (femoral and popliteal veins). History of smoking. Evaluate for DVT. EXAM: BILATERAL LOWER EXTREMITY VENOUS DOPPLER ULTRASOUND TECHNIQUE: Gray-scale sonography with graded compression, as well as color Doppler and duplex ultrasound were performed to evaluate the lower extremity deep venous systems from the level of the common femoral vein and including the common femoral, femoral, profunda femoral, popliteal and calf veins including the posterior tibial, peroneal and gastrocnemius veins when visible. The superficial great saphenous vein was also interrogated. Spectral Doppler was utilized to evaluate flow at rest and with distal augmentation maneuvers in the common femoral, femoral and popliteal veins. COMPARISON:  Bilateral lower extremity venous Doppler ultrasound-09/22/2017 (negative). FINDINGS: Examination is degraded due to patient body habitus and poor sonographic window RIGHT  LOWER EXTREMITY Common Femoral Vein: No evidence of thrombus. Normal compressibility,  respiratory phasicity and response to augmentation. Saphenofemoral Junction: No evidence of thrombus. Normal compressibility and flow on color Doppler imaging. Profunda Femoral Vein: No evidence of thrombus. Normal compressibility and flow on color Doppler imaging. Femoral Vein: Appears patent where imaged. Popliteal Vein: No evidence of acute or chronic thrombus. Normal compressibility, respiratory phasicity and response to augmentation. Calf Veins: Appear patent where imaged. Superficial Great Saphenous Vein: No evidence of thrombus. Normal compressibility. Venous Reflux:  None. Other Findings:  None. LEFT LOWER EXTREMITY Common Femoral Vein: No evidence of thrombus. Normal compressibility, respiratory phasicity and response to augmentation. Saphenofemoral Junction: No evidence of thrombus. Normal compressibility and flow on color Doppler imaging. Profunda Femoral Vein: No evidence of thrombus. Normal compressibility and flow on color Doppler imaging. Femoral Vein: Appears patent where imaged. Popliteal Vein: No evidence of thrombus. Normal compressibility, respiratory phasicity and response to augmentation. Calf Veins: Appear patent where imaged. Superficial Great Saphenous Vein: No evidence of thrombus. Normal compressibility. Venous Reflux:  None. Other Findings:  None. IMPRESSION: No evidence of acute or chronic DVT within either lower extremity on this body habitus degraded examination. Electronically Signed   By: Sandi Mariscal M.D.   On: 03/08/2020 16:21   DG Chest Port 1 View  Result Date: 03/13/2020 CLINICAL DATA:  Dyspnea, chronic respiratory failure, worsening lower extremity edema, generalized weakness and dyspnea on exertion EXAM: PORTABLE CHEST 1 VIEW COMPARISON:  Radiograph 03/07/2020, CT 03/21/2015 FINDINGS: There is cardiomegaly with cephalized vascularity. Tortuosity of the brachiocephalic vessels along the  right upper mediastinum is similar to priors. Few bandlike opacities in the mid lungs may reflect areas of subsegmental atelectasis. No consolidative opacity, pneumothorax or effusion is seen. No acute osseous or soft tissue abnormality. IMPRESSION: Cardiomegaly and vascular congestion may reflect features of heart failure. Bandlike opacities, likely subsegmental atelectasis. Electronically Signed   By: Lovena Le M.D.   On: 03/13/2020 05:52   DG Chest Port 1 View  Result Date: 03/07/2020 CLINICAL DATA:  Shortness of breath EXAM: PORTABLE CHEST 1 VIEW COMPARISON:  02/17/2020 FINDINGS: Stable cardiomegaly. Calcific aortic knob. Mild vascular congestion and subtly increased interstitial markings bilaterally. Linear atelectasis within the peripheral aspect of the left mid lung. The visualized skeletal structures are unremarkable. IMPRESSION: Cardiomegaly with mild pulmonary vascular congestion and subtly increased interstitial markings suggesting mild edema. Electronically Signed   By: Davina Poke D.O.   On: 03/07/2020 12:29   DG Chest Port 1 View  Result Date: 02/17/2020 CLINICAL DATA:  Shortness of breath EXAM: PORTABLE CHEST 1 VIEW COMPARISON:  February 14, 2020 FINDINGS: There is scarring in the right base region. There is mild atelectasis in the left base. There is stable cardiomegaly with pulmonary venous hypertension. No adenopathy. There is aortic atherosclerosis. No bone lesions. IMPRESSION: Cardiomegaly with pulmonary vascular congestion. Mild scarring right base. Mild atelectasis left base. No appreciable pulmonary edema. Aortic Atherosclerosis (ICD10-I70.0). Electronically Signed   By: Lowella Grip III M.D.   On: 02/17/2020 07:46   ECHOCARDIOGRAM COMPLETE  Result Date: 02/16/2020    ECHOCARDIOGRAM REPORT   Patient Name:   CHELCY BOLDA Date of Exam: 02/16/2020 Medical Rec #:  [id number]             Height:       66.0 in Accession #:    [id number]            Weight:       266.1  lb Date of Birth:  [birth year]  BSA:          2.26 m Patient Age:    52 years              BP:           126/89 mmHg Patient Gender: F                     HR:           61 bpm. Exam Location:  [facility] Procedure: 2D Echo Indications:    Dyspnea 786.09 / R06.00  History:        Patient has prior history of Echocardiogram examinations, most                 recent 09/22/2017. CHF, Stroke and COPD; Risk Factors:Current                 Smoker, Diabetes and Hypertension. Crack cocaine use, RBBB,                 Acute renal failure , LVH , Acute respiratory failure with                 hypoxia.  Sonographer:    Leavy Cella RDCS (AE) Referring Phys: [id number] COURAGE EMOKPAE IMPRESSIONS  1. Left ventricular ejection fraction, by estimation, is 55 to 60%. The left ventricle has normal function. The left ventricle has no regional wall motion abnormalities. There is mild concentric left ventricular hypertrophy. Left ventricular diastolic parameters are indeterminate. Elevated left ventricular end-diastolic pressure.  2. Right ventricular systolic function is severely reduced. The right ventricular size is moderately enlarged. mildly increased right ventricular wall thickness. There is moderately elevated pulmonary artery systolic pressure.  3. Left atrial size was severely dilated.  4. Right atrial size was severely dilated.  5. The mitral valve is grossly normal. Mild mitral valve regurgitation.  6. Tricuspid valve regurgitation is moderate.  7. The aortic valve is tricuspid. Aortic valve regurgitation is not visualized. No aortic stenosis is present.  8. The inferior vena cava is dilated in size with >50% respiratory variability, suggesting right atrial pressure of 8 mmHg. FINDINGS  Left Ventricle: Left ventricular ejection fraction, by estimation, is 55 to 60%. The left ventricle has normal function. The left ventricle has no regional wall motion abnormalities. The left ventricular internal cavity size was  normal in size. There is  mild concentric left ventricular hypertrophy. Left ventricular diastolic parameters are indeterminate. Elevated left ventricular end-diastolic pressure. Right Ventricle: The right ventricular size is moderately enlarged. Mildly increased right ventricular wall thickness. Right ventricular systolic function is severely reduced. There is moderately elevated pulmonary artery systolic pressure. The tricuspid  regurgitant velocity is 3.12 m/s, and with an assumed right atrial pressure of 10 mmHg, the estimated right ventricular systolic pressure is 27.0 mmHg. Left Atrium: Left atrial size was severely dilated. Right Atrium: Right atrial size was severely dilated. Pericardium: There is no evidence of pericardial effusion. Mitral Valve: The mitral valve is grossly normal. Mild mitral annular calcification. Mild mitral valve regurgitation. Tricuspid Valve: The tricuspid valve is grossly normal. Tricuspid valve regurgitation is moderate. Aortic Valve: The aortic valve is tricuspid. Aortic valve regurgitation is not visualized. No aortic stenosis is present. Pulmonic Valve: The pulmonic valve was grossly normal. Pulmonic valve regurgitation is not visualized. Aorta: The aortic root is normal in size and structure. Venous: The inferior vena cava is dilated in size with greater than 50% respiratory variability, suggesting right atrial pressure of  8 mmHg. IAS/Shunts: No atrial level shunt detected by color flow Doppler.  LEFT VENTRICLE PLAX 2D LVIDd:         4.54 cm  Diastology LVIDs:         2.77 cm  LV e' lateral:   11.20 cm/s LV PW:         1.40 cm  LV E/e' lateral: 7.8 LV IVS:        1.16 cm  LV e' medial:    4.24 cm/s LVOT diam:     2.10 cm  LV E/e' medial:  20.7 LV SV Index:   26.96 LVOT Area:     3.46 cm  RIGHT VENTRICLE RV S prime:     8.05 cm/s LEFT ATRIUM              Index       RIGHT ATRIUM           Index LA diam:        4.40 cm  1.95 cm/m  RA Area:     41.10 cm LA Vol (A2C):   143.0 ml  63.34 ml/m RA Volume:   176.00 ml 77.95 ml/m LA Vol (A4C):   99.1 ml  43.89 ml/m LA Biplane Vol: 121.0 ml 53.59 ml/m   AORTA Ao Root diam: 3.40 cm MITRAL VALVE               TRICUSPID VALVE MV Area (PHT): 4.06 cm    TR Peak grad:   38.9 mmHg MV Decel Time: 187 msec    TR Vmax:        312.00 cm/s MR Peak grad: 62.1 mmHg MR Vmax:      394.00 cm/s  SHUNTS MV E velocity: 87.80 cm/s  Systemic Diam: 2.10 cm MV A velocity: 47.50 cm/s MV E/A ratio:  1.85 Kate Sable MD Electronically signed by Kate Sable MD Signature Date/Time: 02/16/2020/4:32:20 PM    Final    US Abdomen Limited RUQ  Result Date: 03/07/2020 CLINICAL DATA:  Right upper quadrant pain and elevated LFTs EXAM: ULTRASOUND ABDOMEN LIMITED RIGHT UPPER QUADRANT COMPARISON:  02/16/2020 FINDINGS: Gallbladder: No gallstones or wall thickening visualized. No sonographic Murphy sign noted by sonographer. Common bile duct: Diameter: 4 mm Liver: No focal lesion identified. Within normal limits in parenchymal echogenicity. Portal vein is patent on color Doppler imaging with normal direction of blood flow towards the liver. Other: None. IMPRESSION: No acute abnormality noted. Electronically Signed   By: Inez Catalina M.D.   On: 03/07/2020 15:05   US Abdomen Limited RUQ  Result Date: 02/16/2020 CLINICAL DATA:  Bilirubinemia EXAM: ULTRASOUND ABDOMEN LIMITED RIGHT UPPER QUADRANT COMPARISON:  None. FINDINGS: Gallbladder: No gallstones or wall thickening visualized. No sonographic Murphy sign noted by sonographer. Common bile duct: Diameter: 3 mm Liver: No focal lesion identified. Within normal limits in parenchymal echogenicity. Portal vein is patent on color Doppler imaging with normal direction of blood flow towards the liver. IMPRESSION: Negative right upper quadrant ultrasound. Electronically Signed   By: Monte Fantasia M.D.   On: 02/16/2020 10:40   Chelsi Warr Wynetta Emery, MD  How to contact the [facility] Attending or Consulting provider [place] or covering  provider during after hours [place], for this patient?  1. Check the care team in [facility] and look for a) attending/consulting [facility] provider listed and b) the [facility] team listed 2. Log into [email or web address] and use [place]'s universal password to access. If you do not have the password, please contact the  hospital operator. 3. Locate the [facility] provider you are looking for under [facility] and page to a number that you can be directly reached. 4. If you still have difficulty reaching the provider, please page the [facility] (Director on Call) for the Hospitalists listed on amion for assistance.   If 7PM-7AM, please contact night-coverage [email or web address] Password [email or web address] 03/16/2020, 4:08 PM   LOS: 9 days

## 2020-03-16 NOTE — Plan of Care (Signed)
  Problem: Acute Rehab PT Goals(only PT should resolve) Goal: Pt Will Go Supine/Side To Sit Outcome: Progressing Flowsheets (Taken 03/16/2020 1209) Pt will go Supine/Side to Sit: with minimal assist Goal: Pt Will Go Sit To Supine/Side Outcome: Progressing Flowsheets (Taken 03/16/2020 1209) Pt will go Sit to Supine/Side: with minimal assist Goal: Patient Will Perform Sitting Balance Outcome: Progressing Flowsheets (Taken 03/16/2020 1209) Patient will perform sitting balance: with modified independence Goal: Patient Will Transfer Sit To/From Stand Outcome: Progressing Flowsheets (Taken 03/16/2020 1209) Patient will transfer sit to/from stand:  with minimal assist  with moderate assist Goal: Pt Will Transfer Bed To Chair/Chair To Bed Outcome: Progressing Flowsheets (Taken 03/16/2020 1209) Pt will Transfer Bed to Chair/Chair to Bed:  with min assist  with mod assist Goal: Pt Will Ambulate Outcome: Progressing Flowsheets (Taken 03/16/2020 1209) Pt will Ambulate:  15 feet  with minimal assist  with moderate assist  with rolling walker   12:10 PM, 03/16/20 Lonell Grandchild, MPT Physical Therapist with [facility] 336 [phone] office [phone] mobile phone

## 2020-03-16 NOTE — Progress Notes (Signed)
MEWS score change from yellow to red.  Notified Dr. Wynetta Emery, charge nurse and [place]  03/16/2020 @ 1008  Continue to document and monitor per MEWS guidelines  Elodia Florence RN  MEWS Guidelines - (patients age 60 and over)  Red - At High Risk for Deterioration Yellow - At risk for Deterioration  1. Go to room and assess patient 2. Validate data. Is this patient's baseline? If data confirmed: 3. Is this an acute change? 4. Administer prn meds/treatments as ordered. 5. Note Sepsis score 6. Review goals of care 7. Sports coach, RRT nurse and Provider. 8. Ask Provider to come to bedside.  9. Document patient condition/interventions/response. 10. Increase frequency of vital signs and focused assessments to at least q15 minutes x 4, then q30 minutes x2. - If stable, then q1h x3, then q4h x3 and then q8h or dept. routine. - If unstable, contact Provider & RRT nurse. Prepare for possible transfer. 11. Add entry in progress notes using the smart phrase ".MEWS". 1. Go to room and assess patient 2. Validate data. Is this patient's baseline? If data confirmed: 3. Is this an acute change? 4. Administer prn meds/treatments as ordered? 5. Note Sepsis score 6. Review goals of care 7. Sports coach and Provider 8. Call RRT nurse as needed. 9. Document patient condition/interventions/response. 10. Increase frequency of vital signs and focused assessments to at least q2h x2. - If stable, then q4h x2 and then q8h or dept. routine. - If unstable, contact Provider & RRT nurse. Prepare for possible transfer. 11. Add entry in progress notes using the smart phrase ".MEWS".  Green - Likely stable Lavender - Comfort Care Only  1. Continue routine/ordered monitoring.  2. Review goals of care. 1. Continue routine/ordered monitoring. 2. Review goals of care.

## 2020-03-16 NOTE — Progress Notes (Signed)
Patient ID: Tracy [name], female   DOB: [birth year], 60 y.o.   MRN: [id number] S: Somnolent but arousable.  No new complaints.  Had 3 liters UOP over last 24 hours O:BP 118/76   Pulse 66   Temp (!) 97.5 F (36.4 C)   Resp 20   Ht 5\' 6"  (1.676 m)   Wt 115.2 kg   SpO2 99%   BMI 40.99 kg/m   Intake/Output Summary (Last 24 hours) at 03/16/2020 1156 Last data filed at 03/16/2020 1601 Gross per 24 hour  Intake 480 ml  Output 3350 ml  Net -2870 ml   Intake/Output: I/O last 3 completed shifts: In: 483 [P.O.:480; I.V.:3] Out: 5250 [Urine:5250]  Intake/Output this shift:  No intake/output data recorded. Weight change:  UXN:ATFTDDUK obese female sitting upright in chair in NAD CVS: no rub Resp: diminished BS bilaterally with poor inspiratory effort Abd: obese Ext: 2+ edema  Recent Labs  Lab 03/10/20 0450 03/10/20 0450 03/11/20 0610 03/12/20 0633 03/13/20 0452 03/13/20 0916 03/14/20 0401 03/15/20 0341 03/16/20 0702  NA 133*   < > 133* 133* 134* 134* 138  138 139  138 138  K 4.2   < > 3.9 4.8 5.3* 5.2* 4.7  4.6 3.7  3.7 3.5  CL 97*   < > 94* 96* 96* 96* 99  99 102  102 100  CO2 19*   < > 22 20* 14* 13* 21*  21* 24  23 26   GLUCOSE 207*   < > 147* 74 97 121* 120*  120* 104*  102* 120*  BUN 83*   < > 86* 89* 97* 101* 112*  112* 99*  98* 76*  CREATININE 2.73*   < > 2.53* 2.50* 2.92* 3.02* 2.73*  2.73* 2.11*  2.13* 1.54*  ALBUMIN 2.5*  --   --   --  2.5*  --  2.4*  2.4* 2.4*  2.4* 2.5*  CALCIUM 8.6*   < > 8.7* 9.0 9.0 9.1 8.6*  8.5* 8.3*  8.1* 8.4*  PHOS  --   --   --   --   --   --  6.2* 4.0 2.8  AST 22  --   --   --  33  --  44* 39  --   ALT 71*  --   --   --  56*  --  61* 56*  --    < > = values in this interval not displayed.   Liver Function Tests: Recent Labs  Lab 03/13/20 0452 03/13/20 0452 03/14/20 0401 03/15/20 0341 03/16/20 0702  AST 33  --  44* 39  --   ALT 56*  --  61* 56*  --   ALKPHOS 278*  --  220* 202*  --   BILITOT 9.2*  --   10.6* 8.7*  --   PROT 5.3*  --  4.8* 4.8*  --   ALBUMIN 2.5*   < > 2.4*  2.4* 2.4*  2.4* 2.5*   < > = values in this interval not displayed.   No results for input(s): LIPASE, AMYLASE in the last 168 hours. Recent Labs  Lab 03/13/20 0916  AMMONIA 26   CBC: Recent Labs  Lab 03/12/20 1010 03/13/20 0916  WBC 10.5 14.1*  HGB 13.7 15.0  HCT 47.4* 51.9*  MCV 94.6 94.7  PLT 73* 83*   Cardiac Enzymes: No results for input(s): CKTOTAL, CKMB, CKMBINDEX, TROPONINI in the last 168 hours. CBG: Recent Labs  Lab 03/15/20 1115 03/15/20  1609 03/16/20 0306 03/16/20 0723 03/16/20 1106  GLUCAP 123* 146* 138* 102* 154*    Iron Studies: No results for input(s): IRON, TIBC, TRANSFERRIN, FERRITIN in the last 72 hours. Studies/Results: No results found. [name] apixaban  5 mg Oral BID  . budesonide (PULMICORT) nebulizer solution  0.25 mg Nebulization BID  . Chlorhexidine Gluconate Cloth  6 each Topical Daily  . Chlorhexidine Gluconate Cloth  6 each Topical Q0600  . diltiazem  30 mg Oral Q6H  . insulin aspart  0-9 Units Subcutaneous TID WC  . ipratropium-albuterol  3 mL Nebulization TID  . metoprolol tartrate  50 mg Oral BID  . naLOXone (NARCAN)  injection  0.4 mg Intravenous Once  . nicotine  21 mg Transdermal Daily  . sodium chloride flush  10-40 mL Intracatheter Q12H  . sodium chloride flush  3 mL Intravenous Q12H    BMET    Component Value Date/Time   NA 138 03/16/2020 0702   K 3.5 03/16/2020 0702   CL 100 03/16/2020 0702   CO2 26 03/16/2020 0702   GLUCOSE 120 (H) 03/16/2020 0702   BUN 76 (H) 03/16/2020 0702   CREATININE 1.54 (H) 03/16/2020 0702   CALCIUM 8.4 (L) 03/16/2020 0702   GFRNONAA 37 (L) 03/16/2020 0702   GFRAA 42 (L) 03/16/2020 0702   CBC    Component Value Date/Time   WBC 14.1 (H) 03/13/2020 0916   RBC 5.48 (H) 03/13/2020 0916   HGB 15.0 03/13/2020 0916   HCT 51.9 (H) 03/13/2020 0916   PLT 83 (L) 03/13/2020 0916   MCV 94.7 03/13/2020 0916   MCH 27.4  03/13/2020 0916   MCHC 28.9 (L) 03/13/2020 0916   RDW 25.1 (H) 03/13/2020 0916   LYMPHSABS 0.8 03/08/2020 0548   MONOABS 0.2 03/08/2020 0548   EOSABS 0.0 03/08/2020 0548   BASOSABS 0.0 03/08/2020 0548     Assessment/Plan:  1. AKI/CKD stage 3b-4: in setting of acute on chronic CHF and likely cardiorenal syndrome as well as ischemic ATN in setting of A fib with RVR and hypotension.  Diuresing well and SCr continues to improve and now at baseline.  Received 80 mg IV lasix yesterday with significant diuresis. 1. Transition to po lasix 80 mg daily and follow I's/O's  2. No indication for HD at this time.  3. Continue to follow renal function. 2. Acute on chronic respiratory failure- now out of SDU. 3. A fib with RVR- HR in the 120's and drops in BP noted.  improved with diltiazem. 4. Acute on chronic diastolic CHF/Cor Pulmonale- diuresing well and will transition to po lasix today. 5. COPD- per primary svc 6. Sepsis due to E Coli UTI- off ceftriaxone.  7. Coagulopathy- elevated INR treated with vitamin K. Presumably due to chronic hepatic congestion from cor pulmonale/right heart failure.  8. Thrombocytopenia- chronic. 9. DM type 2- per primary svc 10. Hypokalemia- mild.  Will need to replete and follow. 11. Hyponatremia- due to CHF and acute respiratory distress.  Improving.  12. Disposition- poor compliance and multiple admissions with acute on chronic CHF due to nonadherence to meds and sodium restriction.  Poor medical insight and judgement.  Agree with discharge to SNF.  Donetta Potts, MD [employer] [phone]

## 2020-03-17 LAB — GLUCOSE, CAPILLARY
Glucose-Capillary: 105 mg/dL — ABNORMAL HIGH (ref 70–99)
Glucose-Capillary: 115 mg/dL — ABNORMAL HIGH (ref 70–99)
Glucose-Capillary: 118 mg/dL — ABNORMAL HIGH (ref 70–99)
Glucose-Capillary: 149 mg/dL — ABNORMAL HIGH (ref 70–99)

## 2020-03-17 LAB — RENAL FUNCTION PANEL
Albumin: 2.4 g/dL — ABNORMAL LOW (ref 3.5–5.0)
Anion gap: 9 (ref 5–15)
BUN: 58 mg/dL — ABNORMAL HIGH (ref 6–20)
CO2: 29 mmol/L (ref 22–32)
Calcium: 8.3 mg/dL — ABNORMAL LOW (ref 8.9–10.3)
Chloride: 100 mmol/L (ref 98–111)
Creatinine, Ser: 1.32 mg/dL — ABNORMAL HIGH (ref 0.44–1.00)
GFR calc Af Amer: 51 mL/min — ABNORMAL LOW (ref >60)
GFR calc non Af Amer: 44 mL/min — ABNORMAL LOW (ref >60)
Glucose, Bld: 111 mg/dL — ABNORMAL HIGH (ref 70–99)
Phosphorus: 2.3 mg/dL — ABNORMAL LOW (ref 2.5–4.6)
Potassium: 3.6 mmol/L (ref 3.5–5.1)
Sodium: 138 mmol/L (ref 135–145)

## 2020-03-17 LAB — SARS CORONAVIRUS 2 (TAT 6-24 HRS): SARS Coronavirus 2: NEGATIVE

## 2020-03-17 MED ORDER — LINAGLIPTIN 5 MG PO TABS: 5.0000 mg | ORAL_TABLET | Freq: Every day | ORAL | Status: DC

## 2020-03-17 MED ORDER — DILTIAZEM HCL ER COATED BEADS 120 MG PO CP24: 120.0000 mg | ORAL_CAPSULE | Freq: Every day | ORAL | Status: DC

## 2020-03-17 MED ORDER — HYDROCODONE-ACETAMINOPHEN 7.5-325 MG PO TABS: 1.0000 | ORAL_TABLET | Freq: Two times a day (BID) | ORAL | 0 refills | Status: DC | PRN

## 2020-03-17 MED ORDER — APIXABAN 5 MG PO TABS: 5.0000 mg | ORAL_TABLET | Freq: Two times a day (BID) | ORAL | 0 refills | Status: DC

## 2020-03-17 MED ORDER — FUROSEMIDE 80 MG PO TABS: 80.0000 mg | ORAL_TABLET | Freq: Every day | ORAL | 0 refills | Status: DC

## 2020-03-17 MED ORDER — APIXABAN 5 MG PO TABS: 5.0000 mg | ORAL_TABLET | Freq: Two times a day (BID) | ORAL | Status: DC

## 2020-03-17 MED ORDER — METOPROLOL TARTRATE 50 MG PO TABS: 50.0000 mg | ORAL_TABLET | Freq: Two times a day (BID) | ORAL | Status: DC

## 2020-03-17 MED ORDER — DILTIAZEM HCL ER COATED BEADS 120 MG PO CP24
120.0000 mg | ORAL_CAPSULE | Freq: Every day | ORAL | Status: DC
  Administered 2020-03-17: 120 mg via ORAL
  Filled 2020-03-17: qty 1

## 2020-03-17 MED ORDER — DILTIAZEM HCL ER COATED BEADS 120 MG PO CP24: 120.0000 mg | ORAL_CAPSULE | Freq: Every day | ORAL | 0 refills | Status: DC

## 2020-03-17 MED ORDER — METOPROLOL TARTRATE 50 MG PO TABS: 50.0000 mg | ORAL_TABLET | Freq: Two times a day (BID) | ORAL | 0 refills | Status: DC

## 2020-03-17 MED ORDER — FUROSEMIDE 80 MG PO TABS: 80.0000 mg | ORAL_TABLET | Freq: Every day | ORAL | Status: DC

## 2020-03-17 MED ORDER — LINAGLIPTIN 5 MG PO TABS: 5.0000 mg | ORAL_TABLET | Freq: Every day | ORAL | 0 refills | Status: AC

## 2020-03-17 NOTE — Progress Notes (Signed)
Nsg Discharge Note  Admit Date:  03/07/2020 Discharge date: 03/17/2020   Ulis Rias to be D/C'd home  per MD order.  AVS completed.  Copy for chart, and copy for patient signed, and dated. Patient/caregiver able to verbalize understanding.  Discharge Medication: Allergies as of 03/17/2020   No Known Allergies     Medication List    STOP taking these medications   gabapentin 100 MG capsule Commonly known as: NEURONTIN   glimepiride 2 MG tablet Commonly known as: Amaryl   guaiFENesin 600 MG 12 hr tablet Commonly known as: MUCINEX   Jardiance 25 MG Tabs tablet Generic drug: empagliflozin   nicotine 21 mg/24hr patch Commonly known as: NICODERM CQ - dosed in mg/24 hours   potassium chloride SA 20 MEQ tablet Commonly known as: Klor-Con M20   predniSONE 20 MG tablet Commonly known as: Deltasone   traZODone 100 MG tablet Commonly known as: DESYREL   warfarin 5 MG tablet Commonly known as: COUMADIN   Xarelto 20 MG Tabs tablet Generic drug: rivaroxaban     TAKE these medications   acetaminophen 325 MG tablet Commonly known as: TYLENOL Take 2 tablets (650 mg total) by mouth every 6 (six) hours as needed for mild pain (or Fever >/= 101).   albuterol 108 (90 Base) MCG/ACT inhaler Commonly known as: VENTOLIN HFA Inhale 2 puffs into the lungs every 4 (four) hours as needed for wheezing or shortness of breath.   albuterol (2.5 MG/3ML) 0.083% nebulizer solution Commonly known as: PROVENTIL Take 3 mLs (2.5 mg total) by nebulization every 4 (four) hours as needed for wheezing or shortness of breath.   apixaban 5 MG Tabs tablet Commonly known as: ELIQUIS Take 1 tablet (5 mg total) by mouth 2 (two) times daily.   atorvastatin 10 MG tablet Commonly known as: LIPITOR Take 1 tablet (10 mg total) by mouth every evening.   budesonide-formoterol 160-4.5 MCG/ACT inhaler Commonly known as: SYMBICORT Inhale 2 puffs into the lungs 2 (two) times daily.   diltiazem 120  MG 24 hr capsule Commonly known as: CARDIZEM CD Take 1 capsule (120 mg total) by mouth daily.   furosemide 80 MG tablet Commonly known as: LASIX Take 1 tablet (80 mg total) by mouth daily. What changed:   medication strength  how much to take  when to take this   HYDROcodone-acetaminophen 7.5-325 MG tablet Commonly known as: Norco Take 1 tablet by mouth 2 (two) times daily as needed for severe pain. What changed:   when to take this  reasons to take this   linagliptin 5 MG Tabs tablet Commonly known as: TRADJENTA Take 1 tablet (5 mg total) by mouth daily. Start taking on: March 20, 2020   metoprolol tartrate 50 MG tablet Commonly known as: LOPRESSOR Take 1 tablet (50 mg total) by mouth 2 (two) times daily. What changed:   medication strength  how much to take       Discharge Assessment: Vitals:   03/17/20 0801 03/17/20 1352  BP:  104/69  Pulse:  88  Resp:  20  Temp:  98.1 F (36.7 C)  SpO2: 96% 96%   Skin clean, dry and intact without evidence of skin break down, no evidence of skin tears noted. IV catheter discontinued intact. Site without signs and symptoms of complications - no redness or edema noted at insertion site, patient denies c/o pain - only slight tenderness at site.  Dressing with slight pressure applied.  D/c Instructions-Education: Discharge instructions given to patient/family with  verbalized understanding. D/c education completed with patient/family including follow up instructions, medication list, d/c activities limitations if indicated, with other d/c instructions as indicated by MD - patient able to verbalize understanding, all questions fully answered. Patient instructed to return to ED, call 911, or call MD for any changes in condition.  Patient escorted home via EMS.  Vernon Prey, RN 03/17/2020 5:42 PM

## 2020-03-17 NOTE — Progress Notes (Signed)
Femoral CVC removed for pt discharge to [facility]. Two sutures removed. Sterile petroleum gauze and sterile gauze applied to site immediately upon removal. Pressure held for 15 minutes to site. Pt and daughter educated to remain lying flat in the bed for 30 minutes after removal and to leave pressure dressing in intact for 24 hours. Verbal understanding obtained.

## 2020-03-17 NOTE — Discharge Summary (Signed)
Physician Discharge Summary  Tracy [name]:[id number] DOB: [birth year] DOA: 03/07/2020  PCP: Tracy Percy, MD  Admit date: 03/07/2020 Discharge date: 03/17/2020  Admitted From:  Home  Disposition: SNF   Recommendations for Outpatient Follow-up:  1. Follow up with PCP in 2 weeks 2. Follow up with cardiology on March 30 as scheduled 3. Follow up with nephrology in 4-6 weeks 4. Please obtain BMP in 1-2 weeks to follow electrolytes 5. Bleeding precautions while on anticoagulant therapy 6. Monitor CBG per facility protocols.  7. Please continue to encourage tobacco cessation.  8. CAREFUL WITH HYPNOTICS AND OPIOID PAIN MEDS  Discharge Condition: STABLE   CODE STATUS: FULL    Brief Hospitalization Summary: Please see all hospital notes, images, labs for full details of the hospitalization. ADMISSION HPI: Tracy [name] is a 59 y.o. female with medical history significant of Right-sided heart failure, COPD, tobacco use, chronic respiratory failure on home oxygen, diabetes and hypertension, was recently discharged on 2/22 after being treated for decompensated CHF.  Patient has been under the care of her daughter who reports that she has been compliant with her medications.  Patient has had worsening lower extremity edema and increased weight gain for over a week now.  She is not had any nausea, vomiting or abdominal pain.  Her p.o. intake has been poor since her last discharge.  She does become short of breath on exertion, but is not particularly worse than her baseline.  She was noted to be hypoglycemic and had altered mental status earlier today and was brought to the hospital for evaluation.  She has been afebrile.  Her daughter reports that her urine is quite dark.  She is noticed that she has been sleeping more lately and has been more somnolent.  ED Course: She was evaluated in the emergency room where she was noted to have significant anasarca.  Blood pressure noted to be on  the lower side, high 90s to low 100s.  She was afebrile.  WBC count elevated at 17.8.  Liver enzymes were also elevated with elevated bilirubin.  Chest x-ray indicates evidence of interstitial edema.  Lactic acid mildly elevated at 2.7.  BNP elevated at 544.  INR elevated at 3.5.  Brief History: 59 year old female with a history of chronic respiratory failure on 3L,diastolic CHF, COPD, tobacco abuse, diabetes mellitus type 2, hypertension, remote cocaine use presenting with 1 week history of worsening lower extremity edema, generalized weakness, and dyspnea on exertion. The patient has had decreased oral intake. She was noted to have some confusion with hypoglycemia at home. As a result, the patient was brought for further evaluation. Patient had recent admission to the hospital from 02/15/2020 to 02/21/2020 for acute on chronic diastolic CHF.Serum creatinine 2.25 on 02/21/20. Serum creatinine noted to be 2.63 on 02/28/20. She was discharged home on furosemide 40 mg twice daily. Discharge weight was 122.6 kg(270lb).She followed up with cardiology in office on 02/28/20 and she was noted to be fluid overloaded. Weight on 02/28/20 was 257.2. Even though her weight was down from time of d/c she was felt to be clinically fluid overloaded and instructed to take lasix 80 mg bid x 4 days, then back to usual 40 mg bid.  Upon presentation, the patient was noted to be fluid overloaded. Chest x-ray showed pulmonary vascular congestion. She was started on IV furosemide60 mg bidinitially and was transitioned to lasix 40 IV bid with slow but good clinical response.  In the early wm 03/13/20, rapid response was  called due to pt somnolence and hypoxia. She was moved to SDU stabilized and returned to floor and monitored for stability.   She is now recovered and stable to discharge to SNF  Assessment/Plan: Acute on chronic diastolic CHF/cor pulmonale -Patient remains clinically fluid overloaded -endorses dietary  and fluid indiscretions -Continue IV furosemide>>>transition to po when ok with renal -3/16 weight = 254.8lbs--NEG 12lbs -Accurate I's and O's -body habitus makes fluid status assessment difficult -appreciate cardiology/nephrology  Intake/Output Summary (Last 24 hours) at 03/17/2020 1149 Last data filed at 03/17/2020 8466 Gross per 24 hour  Intake 270 ml  Output 1700 ml  Net -1430 ml   Acute metabolic encephalopathy -5/99/35 AM--per RN reports, pt found somnolent and hypoxic -apparently then became agitated and given ativan at 0620 -3/15 ABG--7.319/19/137/ 14 (0.6) -CBG 54-->d50W-->no response(repeat CBG 107) -narcan x 1-->mild response with pt intermittently following commands short time -CT brain--neg -blood cultures x 2 -repeat UA--no pyuria -ammonia--26 -placed on BiPAP>>>>now off last 24 hours, stable on 3L -3/15--transfer to SDU -03/14/20--more awake and alert, near baseline -likely due to opioids/hynotics-->WE HAVE GREATLY REDUCED HYPNOTICS   Bacteremia - CONTAMINANT    -3/15 Blood culture--GPC one of 2 sets -likely contaminant  - DC VANCOMYCIN -03/14/20 - general surgery changed out femoral central line to opposite side on 03/14/20 -DC central line 3/19  CKD stage IV -creatinine improving with diuresis, now down to 1.32 -appreciate nephrology consult--lasix held 3/15, redose IV 80 mg on 3/16 -renal function stabilizing.  Restarted oral lasix 80 mg daily and tolerating well. -Follow up with Ramiro Harvest PA-C nephrology office in 4 weeks.   Hyperkalemia RESOLVED -give bicarbonate x 1 -Calcium gluconate x 1  COPD exacerbation RESOLVED -treated with Pulmicort -Continue duo nebs -continue IV Solu-Medrol>>>po prednisone>>>stopped -finished 5 daysazithromycin -resume home COPD bronchodilators at DC  Sepsis  RESOLVED -present on admission -secondary to UTI -wbc 17.8 with lactate 2.7 initially -finished course of ceftriaxone -sepsis physiology  resolved  [place] UTI - TREATED -UA>50 WBC -finished 6 days ceftriaxone  Acute onChronic respiratory failure with hypoxia -Patient is chronically on 3 L nasal cannula -placed on BiPAP 3/15 due to AMS with hypoxia, likely hypoventilation -3/16--now stable on 3L  Thrombocytopenia -Has been chronic, no bleeding seen, monitor for any signs of bleeding complications   Paroxysmal atrial fibrillation,with RVR -Restart metoprolol-->increased to50mg  bid -d/crivaroxaban due to worsen renal function -start apixabanonce INR subtherapeutic -transitioned to IV lopressor until able to take po -IV diltiazem added 3/15 evening-->transitioned to po  -Cardizem CD 120 mg daily with metoprolol 50 mg BID seem to be controlling rates at this time  Diabetes mellitus type 2 -NovoLog sliding scale -DC Jardiance and Amaryl -02/15/20 A1C--7.5 -Tradjenta 5 mg po daily ordered.   Hyponatremia - Improving with diuresis -secondary to volume overload -Resolved now.    Tobacco abuse -Tobacco cessation discussed  Chronic leg pain -controlled with as needed hydrocodone -Venous duplex--neg for DVT  Morbid Obesity -BMI 42.17 -lifestyle modification   Disposition Plan: Patient From: Home D/C Place:SNF Barriers: SNF authorization pending, hopeful to DC in AM to SNF  Family Communication:son visited today and was updated  Consultants:none  Code Status: FULL   DVT Prophylaxis:pt continues to have supratherapeutic INR   Procedures: As Listed in Progress Note Above  Antibiotics: Vanc/cefepime 3/15>>> ceftriaxone 3/9>>3/15 azithro 3/9>>>3/13  Discharge Diagnoses:  Active Problems:   Tobacco abuse   Thrombocytopenia (HCC)   Essential hypertension   COPD exacerbation (HCC)   Body mass index (BMI) of 40.0-44.9 in adult (  [place])   Diabetes mellitus type 2, uncontrolled (HCC)   Paroxysmal A-fib ([place])   Acute on chronic diastolic CHF (congestive heart failure) (HCC)    Acute on chronic right-sided heart failure (HCC)   Chronic respiratory failure with hypoxia (HCC)   Acute lower UTI   CKD (chronic kidney disease) stage 4, GFR 15-29 ml/min (HCC)   Anasarca   Poor intravenous access   Acute cystitis with hematuria   Elevated LFTs   Sepsis due to Escherichia coli (E. coli) (HCC)   Acute metabolic encephalopathy   Acute on chronic respiratory failure with hypoxia [facility])   Discharge Instructions:  Allergies as of 03/17/2020   No Known Allergies     Medication List    STOP taking these medications   gabapentin 100 MG capsule Commonly known as: NEURONTIN   glimepiride 2 MG tablet Commonly known as: Amaryl   guaiFENesin 600 MG 12 hr tablet Commonly known as: MUCINEX   Jardiance 25 MG Tabs tablet Generic drug: empagliflozin   nicotine 21 mg/24hr patch Commonly known as: NICODERM CQ - dosed in mg/24 hours   potassium chloride SA 20 MEQ tablet Commonly known as: Klor-Con M20   predniSONE 20 MG tablet Commonly known as: Deltasone   traZODone 100 MG tablet Commonly known as: DESYREL   warfarin 5 MG tablet Commonly known as: COUMADIN   Xarelto 20 MG Tabs tablet Generic drug: rivaroxaban     TAKE these medications   acetaminophen 325 MG tablet Commonly known as: TYLENOL Take 2 tablets (650 mg total) by mouth every 6 (six) hours as needed for mild pain (or Fever >/= 101).   albuterol 108 (90 Base) MCG/ACT inhaler Commonly known as: VENTOLIN HFA Inhale 2 puffs into the lungs every 4 (four) hours as needed for wheezing or shortness of breath.   albuterol (2.5 MG/3ML) 0.083% nebulizer solution Commonly known as: PROVENTIL Take 3 mLs (2.5 mg total) by nebulization every 4 (four) hours as needed for wheezing or shortness of breath.   apixaban 5 MG Tabs tablet Commonly known as: ELIQUIS Take 1 tablet (5 mg total) by mouth 2 (two) times daily.   atorvastatin 10 MG tablet Commonly known as: LIPITOR Take 1 tablet (10 mg total) by mouth  every evening.   budesonide-formoterol 160-4.5 MCG/ACT inhaler Commonly known as: SYMBICORT Inhale 2 puffs into the lungs 2 (two) times daily.   diltiazem 120 MG 24 hr capsule Commonly known as: CARDIZEM CD Take 1 capsule (120 mg total) by mouth daily.   furosemide 80 MG tablet Commonly known as: LASIX Take 1 tablet (80 mg total) by mouth daily. What changed:   medication strength  how much to take  when to take this   HYDROcodone-acetaminophen 7.5-325 MG tablet Commonly known as: Norco Take 1 tablet by mouth 2 (two) times daily as needed for severe pain. What changed:   when to take this  reasons to take this   linagliptin 5 MG Tabs tablet Commonly known as: TRADJENTA Take 1 tablet (5 mg total) by mouth daily. Start taking on: March 20, 2020   metoprolol tartrate 50 MG tablet Commonly known as: LOPRESSOR Take 1 tablet (50 mg total) by mouth 2 (two) times daily. What changed:   medication strength  how much to take       Contact information for follow-up providers    Ramiro Harvest, PA-C. Schedule an appointment as soon as possible for a visit in 4 week(s).   Specialty: Nephrology Why: Hospital Follow Up  Contact  information: [address] [place] [address] [phone]            Contact information for after-discharge care    [facility] .   Service: Skilled Nursing Contact information: [address] [address] [phone]                 No Known Allergies Allergies as of 03/17/2020   No Known Allergies     Medication List    STOP taking these medications   gabapentin 100 MG capsule Commonly known as: NEURONTIN   glimepiride 2 MG tablet Commonly known as: Amaryl   guaiFENesin 600 MG 12 hr tablet Commonly known as: MUCINEX   Jardiance 25 MG Tabs tablet Generic drug: empagliflozin   nicotine 21 mg/24hr patch Commonly known as: NICODERM CQ - dosed in  mg/24 hours   potassium chloride SA 20 MEQ tablet Commonly known as: Klor-Con M20   predniSONE 20 MG tablet Commonly known as: Deltasone   traZODone 100 MG tablet Commonly known as: DESYREL   warfarin 5 MG tablet Commonly known as: COUMADIN   Xarelto 20 MG Tabs tablet Generic drug: rivaroxaban     TAKE these medications   acetaminophen 325 MG tablet Commonly known as: TYLENOL Take 2 tablets (650 mg total) by mouth every 6 (six) hours as needed for mild pain (or Fever >/= 101).   albuterol 108 (90 Base) MCG/ACT inhaler Commonly known as: VENTOLIN HFA Inhale 2 puffs into the lungs every 4 (four) hours as needed for wheezing or shortness of breath.   albuterol (2.5 MG/3ML) 0.083% nebulizer solution Commonly known as: PROVENTIL Take 3 mLs (2.5 mg total) by nebulization every 4 (four) hours as needed for wheezing or shortness of breath.   apixaban 5 MG Tabs tablet Commonly known as: ELIQUIS Take 1 tablet (5 mg total) by mouth 2 (two) times daily.   atorvastatin 10 MG tablet Commonly known as: LIPITOR Take 1 tablet (10 mg total) by mouth every evening.   budesonide-formoterol 160-4.5 MCG/ACT inhaler Commonly known as: SYMBICORT Inhale 2 puffs into the lungs 2 (two) times daily.   diltiazem 120 MG 24 hr capsule Commonly known as: CARDIZEM CD Take 1 capsule (120 mg total) by mouth daily.   furosemide 80 MG tablet Commonly known as: LASIX Take 1 tablet (80 mg total) by mouth daily. What changed:   medication strength  how much to take  when to take this   HYDROcodone-acetaminophen 7.5-325 MG tablet Commonly known as: Norco Take 1 tablet by mouth 2 (two) times daily as needed for severe pain. What changed:   when to take this  reasons to take this   linagliptin 5 MG Tabs tablet Commonly known as: TRADJENTA Take 1 tablet (5 mg total) by mouth daily. Start taking on: March 20, 2020   metoprolol tartrate 50 MG tablet Commonly known as: LOPRESSOR Take 1  tablet (50 mg total) by mouth 2 (two) times daily. What changed:   medication strength  how much to take       Procedures/Studies: CT HEAD WO CONTRAST  Result Date: 03/13/2020 CLINICAL DATA:  Encephalopathy EXAM: CT HEAD WITHOUT CONTRAST TECHNIQUE: Contiguous axial images were obtained from the base of the skull through the vertex without intravenous contrast. COMPARISON:  02/24/2020 FINDINGS: Brain: There is no acute intracranial hemorrhage, mass effect, or edema. Gray-white differentiation is preserved. There is no extra-axial fluid collection. Ventricles and sulci are within normal limits  in size and configuration. Patchy hypoattenuation in the supratentorial white matter is nonspecific but likely reflects reflects stable ischemic changes. 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 Vascular: There is mild atherosclerotic calcification at the skull base. Skull: Calvarium is unremarkable. Sinuses/Orbits: No acute finding. Other: None. IMPRESSION: No acute intracranial abnormality. Stable chronic findings detailed above. Electronically Signed   By: Macy Mis M.D.   On: 03/13/2020 11:11   US Venous Img Lower Bilateral (DVT)  Result Date: 03/08/2020 CLINICAL DATA:  Chronic bilateral lower extremity edema, worsened during the past week. History of previous right lower extremity DVT (femoral and popliteal veins). History of smoking. Evaluate for DVT. EXAM: BILATERAL LOWER EXTREMITY VENOUS DOPPLER ULTRASOUND TECHNIQUE: Gray-scale sonography with graded compression, as well as color Doppler and duplex ultrasound were performed to evaluate the lower extremity deep venous systems from the level of the common femoral vein and including the common femoral, femoral, profunda femoral, popliteal and calf veins including the posterior tibial, peroneal and gastrocnemius veins when visible. The superficial great saphenous vein was also  interrogated. Spectral Doppler was utilized to evaluate flow at rest and with distal augmentation maneuvers in the common femoral, femoral and popliteal veins. COMPARISON:  Bilateral lower extremity venous Doppler ultrasound-09/22/2017 (negative). FINDINGS: Examination is degraded due to patient body habitus and poor sonographic window RIGHT LOWER EXTREMITY Common Femoral Vein: No evidence of thrombus. Normal compressibility, respiratory phasicity and response to augmentation. Saphenofemoral Junction: No evidence of thrombus. Normal compressibility and flow on color Doppler imaging. Profunda Femoral Vein: No evidence of thrombus. Normal compressibility and flow on color Doppler imaging. Femoral Vein: Appears patent where imaged. Popliteal Vein: No evidence of acute or chronic thrombus. Normal compressibility, respiratory phasicity and response to augmentation. Calf Veins: Appear patent where imaged. Superficial Great Saphenous Vein: No evidence of thrombus. Normal compressibility. Venous Reflux:  None. Other Findings:  None. LEFT LOWER EXTREMITY Common Femoral Vein: No evidence of thrombus. Normal compressibility, respiratory phasicity and response to augmentation. Saphenofemoral Junction: No evidence of thrombus. Normal compressibility and flow on color Doppler imaging. Profunda Femoral Vein: No evidence of thrombus. Normal compressibility and flow on color Doppler imaging. Femoral Vein: Appears patent where imaged. Popliteal Vein: No evidence of thrombus. Normal compressibility, respiratory phasicity and response to augmentation. Calf Veins: Appear patent where imaged. Superficial Great Saphenous Vein: No evidence of thrombus. Normal compressibility. Venous Reflux:  None. Other Findings:  None. IMPRESSION: No evidence of acute or chronic DVT within either lower extremity on this body habitus degraded examination. Electronically Signed   By: Sandi Mariscal M.D.   On: 03/08/2020 16:21   DG Chest Port 1 View  Result  Date: 03/13/2020 CLINICAL DATA:  Dyspnea, chronic respiratory failure, worsening lower extremity edema, generalized weakness and dyspnea on exertion EXAM: PORTABLE CHEST 1 VIEW COMPARISON:  Radiograph 03/07/2020, CT 03/21/2015 FINDINGS: There is cardiomegaly with cephalized vascularity. Tortuosity of the brachiocephalic vessels along the right upper mediastinum is similar to priors. Few bandlike opacities in the mid lungs may reflect areas of subsegmental atelectasis. No consolidative opacity, pneumothorax or effusion is seen. No acute osseous or soft tissue abnormality. IMPRESSION: Cardiomegaly and vascular congestion may reflect features of heart failure. Bandlike opacities, likely subsegmental atelectasis. Electronically Signed   By: Lovena Le M.D.   On: 03/13/2020 05:52   DG Chest Port 1 View  Result Date: 03/07/2020 CLINICAL DATA:  Shortness of breath EXAM: PORTABLE CHEST 1 VIEW COMPARISON:  02/17/2020 FINDINGS: Stable  cardiomegaly. Calcific aortic knob. Mild vascular congestion and subtly increased interstitial markings bilaterally. Linear atelectasis within the peripheral aspect of the left mid lung. The visualized skeletal structures are unremarkable. IMPRESSION: Cardiomegaly with mild pulmonary vascular congestion and subtly increased interstitial markings suggesting mild edema. Electronically Signed   By: Davina Poke D.O.   On: 03/07/2020 12:29   DG Chest Port 1 View  Result Date: 02/17/2020 CLINICAL DATA:  Shortness of breath EXAM: PORTABLE CHEST 1 VIEW COMPARISON:  February 14, 2020 FINDINGS: There is scarring in the right base region. There is mild atelectasis in the left base. There is stable cardiomegaly with pulmonary venous hypertension. No adenopathy. There is aortic atherosclerosis. No bone lesions. IMPRESSION: Cardiomegaly with pulmonary vascular congestion. Mild scarring right base. Mild atelectasis left base. No appreciable pulmonary edema. Aortic Atherosclerosis (ICD10-I70.0).  Electronically Signed   By: Lowella Grip III M.D.   On: 02/17/2020 07:46   ECHOCARDIOGRAM COMPLETE  Result Date: 02/16/2020    ECHOCARDIOGRAM REPORT   Patient Name:   KATLEEN CARRAWAY Date of Exam: 02/16/2020 Medical Rec #:  [id number]             Height:       66.0 in Accession #:    [id number]            Weight:       266.1 lb Date of Birth:  1961-04-17            BSA:          2.26 m Patient Age:    27 years              BP:           126/89 mmHg Patient Gender: F                     HR:           61 bpm. Exam Location:  [facility] Procedure: 2D Echo Indications:    Dyspnea 786.09 / R06.00  History:        Patient has prior history of Echocardiogram examinations, most                 recent 09/22/2017. CHF, Stroke and COPD; Risk Factors:Current                 Smoker, Diabetes and Hypertension. Crack cocaine use, RBBB,                 Acute renal failure , LVH , Acute respiratory failure with                 hypoxia.  Sonographer:    Leavy Cella RDCS (AE) Referring Phys: [id number] COURAGE EMOKPAE IMPRESSIONS  1. Left ventricular ejection fraction, by estimation, is 55 to 60%. The left ventricle has normal function. The left ventricle has no regional wall motion abnormalities. There is mild concentric left ventricular hypertrophy. Left ventricular diastolic parameters are indeterminate. Elevated left ventricular end-diastolic pressure.  2. Right ventricular systolic function is severely reduced. The right ventricular size is moderately enlarged. mildly increased right ventricular wall thickness. There is moderately elevated pulmonary artery systolic pressure.  3. Left atrial size was severely dilated.  4. Right atrial size was severely dilated.  5. The mitral valve is grossly normal. Mild mitral valve regurgitation.  6. Tricuspid valve regurgitation is moderate.  7. The aortic valve is tricuspid. Aortic valve regurgitation is not visualized. No aortic stenosis is present.  8. The inferior vena cava is  dilated in size with >50% respiratory variability, suggesting right atrial pressure of 8 mmHg. FINDINGS  Left Ventricle: Left ventricular ejection fraction, by estimation, is 55 to 60%. The left ventricle has normal function. The left ventricle has no regional wall motion abnormalities. The left ventricular internal cavity size was normal in size. There is  mild concentric left ventricular hypertrophy. Left ventricular diastolic parameters are indeterminate. Elevated left ventricular end-diastolic pressure. Right Ventricle: The right ventricular size is moderately enlarged. Mildly increased right ventricular wall thickness. Right ventricular systolic function is severely reduced. There is moderately elevated pulmonary artery systolic pressure. The tricuspid  regurgitant velocity is 3.12 m/s, and with an assumed right atrial pressure of 10 mmHg, the estimated right ventricular systolic pressure is 27.0 mmHg. Left Atrium: Left atrial size was severely dilated. Right Atrium: Right atrial size was severely dilated. Pericardium: There is no evidence of pericardial effusion. Mitral Valve: The mitral valve is grossly normal. Mild mitral annular calcification. Mild mitral valve regurgitation. Tricuspid Valve: The tricuspid valve is grossly normal. Tricuspid valve regurgitation is moderate. Aortic Valve: The aortic valve is tricuspid. Aortic valve regurgitation is not visualized. No aortic stenosis is present. Pulmonic Valve: The pulmonic valve was grossly normal. Pulmonic valve regurgitation is not visualized. Aorta: The aortic root is normal in size and structure. Venous: The inferior vena cava is dilated in size with greater than 50% respiratory variability, suggesting right atrial pressure of 8 mmHg. IAS/Shunts: No atrial level shunt detected by color flow Doppler.  LEFT VENTRICLE PLAX 2D LVIDd:         4.54 cm  Diastology LVIDs:         2.77 cm  LV e' lateral:   11.20 cm/s LV PW:         1.40 cm  LV E/e' lateral: 7.8 LV  IVS:        1.16 cm  LV e' medial:    4.24 cm/s LVOT diam:     2.10 cm  LV E/e' medial:  20.7 LV SV Index:   26.96 LVOT Area:     3.46 cm  RIGHT VENTRICLE RV S prime:     8.05 cm/s LEFT ATRIUM              Index       RIGHT ATRIUM           Index LA diam:        4.40 cm  1.95 cm/m  RA Area:     41.10 cm LA Vol (A2C):   143.0 ml 63.34 ml/m RA Volume:   176.00 ml 77.95 ml/m LA Vol (A4C):   99.1 ml  43.89 ml/m LA Biplane Vol: 121.0 ml 53.59 ml/m   AORTA Ao Root diam: 3.40 cm MITRAL VALVE               TRICUSPID VALVE MV Area (PHT): 4.06 cm    TR Peak grad:   38.9 mmHg MV Decel Time: 187 msec    TR Vmax:        312.00 cm/s MR Peak grad: 62.1 mmHg MR Vmax:      394.00 cm/s  SHUNTS MV E velocity: 87.80 cm/s  Systemic Diam: 2.10 cm MV A velocity: 47.50 cm/s MV E/A ratio:  1.85 Kate Sable MD Electronically signed by Kate Sable MD Signature Date/Time: 02/16/2020/4:32:20 PM    Final    US Abdomen Limited RUQ  Result Date: 03/07/2020 CLINICAL DATA:  Right upper quadrant  pain and elevated LFTs EXAM: ULTRASOUND ABDOMEN LIMITED RIGHT UPPER QUADRANT COMPARISON:  02/16/2020 FINDINGS: Gallbladder: No gallstones or wall thickening visualized. No sonographic Murphy sign noted by sonographer. Common bile duct: Diameter: 4 mm Liver: No focal lesion identified. Within normal limits in parenchymal echogenicity. Portal vein is patent on color Doppler imaging with normal direction of blood flow towards the liver. Other: None. IMPRESSION: No acute abnormality noted. Electronically Signed   By: Inez Catalina M.D.   On: 03/07/2020 15:05      Subjective: Pt reporting no complaints,  She denies palpitations, chest pain, SOB or other complaints, agreeable to SNF  Discharge Exam: Vitals:   03/17/20 0757 03/17/20 0801  BP:    Pulse:    Resp:    Temp:    SpO2: 93% 96%   Vitals:   03/17/20 0314 03/17/20 0557 03/17/20 0757 03/17/20 0801  BP:  111/85    Pulse:  92    Resp:  18    Temp:  98 F (36.7 C)     TempSrc:  Axillary    SpO2:  93% 93% 96%  Weight: 109.5 kg     Height:        General:  Chronically ill appearing awake, alert, NAD.   HEENT: No icterus, No thrush, No neck mass, [place]/AT  Cardiovascular: IRRR, S1/S2, no rubs, no gallops  Respiratory: crackles heard bilateral. No wheeze  Abdomen: Soft/+BS, non tender, non distended, no guarding  Extremities: 1+ edema BLEs   The results of significant diagnostics from this hospitalization (including imaging, microbiology, ancillary and laboratory) are listed below for reference.     Microbiology: Recent Results (from the past 240 hour(s))  Urine Culture     Status: Abnormal   Collection Time: 03/07/20 11:07 AM   Specimen: Urine, Random  Result Value Ref Range Status   Specimen Description   Final    URINE, RANDOM Performed at [facility], [address], [place], [place] [address]    Special Requests   Final    NONE Performed at [facility], [address], [place], [place] [address]    Culture >=100,000 COLONIES/mL ESCHERICHIA COLI (A)  Final   Report Status 03/09/2020 FINAL  Final   Organism ID, Bacteria ESCHERICHIA COLI (A)  Final      Susceptibility   Escherichia coli - MIC*    AMPICILLIN >=32 RESISTANT Resistant     CEFAZOLIN <=4 SENSITIVE Sensitive     CEFTRIAXONE <=0.25 SENSITIVE Sensitive     CIPROFLOXACIN <=0.25 SENSITIVE Sensitive     GENTAMICIN <=1 SENSITIVE Sensitive     IMIPENEM <=0.25 SENSITIVE Sensitive     NITROFURANTOIN <=16 SENSITIVE Sensitive     TRIMETH/SULFA <=20 SENSITIVE Sensitive     AMPICILLIN/SULBACTAM >=32 RESISTANT Resistant     PIP/TAZO <=4 SENSITIVE Sensitive     * >=100,000 COLONIES/mL ESCHERICHIA COLI  Respiratory Panel by RT PCR (Flu A&B, Covid) - Nasopharyngeal Swab     Status: None   Collection Time: 03/07/20 11:53 AM   Specimen: Nasopharyngeal Swab  Result Value Ref Range Status   SARS Coronavirus 2 by RT PCR NEGATIVE NEGATIVE Final    Comment: (NOTE) SARS-CoV-2 target nucleic  acids are NOT DETECTED. The SARS-CoV-2 RNA is generally detectable in upper respiratoy specimens during the acute phase of infection. The lowest concentration of SARS-CoV-2 viral copies this assay can detect is 131 copies/mL. A negative result does not preclude SARS-Cov-2 infection and should not be used as the sole basis for treatment or other  patient management decisions. A negative result may occur with  improper specimen collection/handling, submission of specimen other than nasopharyngeal swab, presence of viral mutation(s) within the areas targeted by this assay, and inadequate number of viral copies (<131 copies/mL). A negative result must be combined with clinical observations, patient history, and epidemiological information. The expected result is Negative. Fact Sheet for Patients:  PinkCheek.be Fact Sheet for Healthcare Providers:  GravelBags.it This test is not yet ap proved or cleared by the [place] FDA and  has been authorized for detection and/or diagnosis of SARS-CoV-2 by FDA under an Emergency Use Authorization (EUA). This EUA will remain  in effect (meaning this test can be used) for the duration of the COVID-19 declaration under Section 564(b)(1) of the Act, 21 U.S.C. section 360bbb-3(b)(1), unless the authorization is terminated or revoked sooner.    Influenza A by PCR NEGATIVE NEGATIVE Final   Influenza B by PCR NEGATIVE NEGATIVE Final    Comment: (NOTE) The Xpert Xpress SARS-CoV-2/FLU/RSV assay is intended as an aid in  the diagnosis of influenza from Nasopharyngeal swab specimens and  should not be used as a sole basis for treatment. Nasal washings and  aspirates are unacceptable for Xpert Xpress SARS-CoV-2/FLU/RSV  testing. Fact Sheet for Patients: PinkCheek.be Fact Sheet for Healthcare Providers: GravelBags.it This test is not yet  approved or cleared by the [place] FDA and  has been authorized for detection and/or diagnosis of SARS-CoV-2 by  FDA under an Emergency Use Authorization (EUA). This EUA will remain  in effect (meaning this test can be used) for the duration of the  Covid-19 declaration under Section 564(b)(1) of the Act, 21  U.S.C. section 360bbb-3(b)(1), unless the authorization is  terminated or revoked. Performed at [facility], [address] [place], [place] [address]   Culture, blood (Routine X 2) w Reflex to ID Panel     Status: None (Preliminary result)   Collection Time: 03/13/20  9:16 AM   Specimen: BLOOD RIGHT ARM  Result Value Ref Range Status   Specimen Description BLOOD RIGHT ARM  Final   Special Requests   Final    BOTTLES DRAWN AEROBIC AND ANAEROBIC Blood Culture adequate volume   Culture   Final    NO GROWTH 4 DAYS Performed at [facility], [address], [place], [place] [address]    Report Status PENDING  Incomplete  Culture, blood (Routine X 2) w Reflex to ID Panel     Status: Abnormal   Collection Time: 03/13/20  9:16 AM   Specimen: BLOOD  Result Value Ref Range Status   Specimen Description   Final    BLOOD DRAWN BY IV THERAPY DRAWN BY RN Performed at [facility], [address], [place], [place] [address]    Special Requests   Final    BOTTLES DRAWN AEROBIC AND ANAEROBIC Blood Culture results may not be optimal due to an inadequate volume of blood received in culture bottles Performed at [facility], [address] [place], [place] [address]    Culture  Setup Time   Final    IN BOTH AEROBIC AND ANAEROBIC BOTTLES GRAM POSITIVE COCCI Gram Stain Report Called to,Read Back By and Verified With: S Newt Lukes @1945  03/13/20 [facility] Performed at [facility], [address] [place], [place] [address]    Culture (A)  Final    STAPHYLOCOCCUS SPECIES (COAGULASE NEGATIVE) THE SIGNIFICANCE OF ISOLATING THIS ORGANISM FROM A SINGLE SET OF BLOOD CULTURES WHEN MULTIPLE SETS ARE  DRAWN IS UNCERTAIN. PLEASE  NOTIFY THE MICROBIOLOGY DEPARTMENT WITHIN ONE WEEK IF SPECIATION AND SENSITIVITIES ARE REQUIRED. Performed at [facility], [place] [address] [place], [place] [address]    Report Status 03/16/2020 FINAL  Final  MRSA PCR Screening     Status: None   Collection Time: 03/13/20 11:59 AM   Specimen: Nasal Mucosa; Nasopharyngeal  Result Value Ref Range Status   MRSA by PCR NEGATIVE NEGATIVE Final    Comment:        The GeneXpert MRSA Assay (FDA approved for NASAL specimens only), is one component of a comprehensive MRSA colonization surveillance program. It is not intended to diagnose MRSA infection nor to guide or monitor treatment for MRSA infections. Performed at [facility], [address], [place], [place] [address]   Culture, Urine     Status: None   Collection Time: 03/13/20 12:00 PM   Specimen: Urine, Clean Catch  Result Value Ref Range Status   Specimen Description   Final    URINE, CLEAN CATCH Performed at [facility], [address] [place], [place] [address]    Special Requests   Final    NONE Performed at [facility], [address], [place], [place] [address]    Culture   Final    NO GROWTH Performed at [facility], [place] [address], [place], [place] [address]    Report Status 03/14/2020 FINAL  Final  SARS CORONAVIRUS 2 (TAT 6-24 HRS) Nasopharyngeal Nasopharyngeal Swab     Status: None   Collection Time: 03/16/20  6:10 PM   Specimen: Nasopharyngeal Swab  Result Value Ref Range Status   SARS Coronavirus 2 NEGATIVE NEGATIVE Final    Comment: (NOTE) SARS-CoV-2 target nucleic acids are NOT DETECTED. The SARS-CoV-2 RNA is generally detectable in upper and lower respiratory specimens during the acute phase of infection. Negative results do not preclude SARS-CoV-2 infection, do not rule out co-infections with other pathogens, and should not be used as the sole basis for treatment or other patient management decisions. Negative  results must be combined with clinical observations, patient history, and epidemiological information. The expected result is Negative. Fact Sheet for Patients: SugarRoll.be Fact Sheet for Healthcare Providers: https://www.woods-mathews.com/ This test is not yet approved or cleared by the [place] FDA and  has been authorized for detection and/or diagnosis of SARS-CoV-2 by FDA under an Emergency Use Authorization (EUA). This EUA will remain  in effect (meaning this test can be used) for the duration of the COVID-19 declaration under Section 56 4(b)(1) of the Act, 21 U.S.C. section 360bbb-3(b)(1), unless the authorization is terminated or revoked sooner. Performed at [facility], [place] [address] [place], [place] [address]      Labs: BNP (last 3 results) Recent Labs    03/07/20 1152 03/10/20 0450 03/13/20 0522  BNP 544.0* 697.0* 867.6*   Basic Metabolic Panel: Recent Labs  Lab 03/13/20 0916 03/14/20 0401 03/15/20 0341 03/16/20 0702 03/17/20 0707  NA 134* 138  138 139  138 138 138  K 5.2* 4.7  4.6 3.7  3.7 3.5 3.6  CL 96* 99  99 102  102 100 100  CO2 13* 21*  21* 24  23 26 29   GLUCOSE 121* 120*  120* 104*  102* 120* 111*  BUN 101* 112*  112* 99*  98* 76* 58*  CREATININE 3.02* 2.73*  2.73* 2.11*  2.13* 1.54* 1.32*  CALCIUM 9.1 8.6*  8.5* 8.3*  8.1* 8.4* 8.3*  MG  --   --   --  1.9  --   PHOS  --  6.2* 4.0 2.8 2.3*   Liver Function Tests: Recent Labs  Lab 03/13/20 0452 03/14/20 0401 03/15/20 0341 03/16/20 0702 03/17/20 0707  AST 33 44* 39  --   --   ALT 56* 61* 56*  --   --   ALKPHOS 278* 220* 202*  --   --   BILITOT 9.2* 10.6* 8.7*  --   --   PROT 5.3* 4.8* 4.8*  --   --   ALBUMIN 2.5* 2.4*  2.4* 2.4*  2.4* 2.5* 2.4*   No results for input(s): LIPASE, AMYLASE in the last 168 hours. Recent Labs  Lab 03/13/20 0916  AMMONIA 26   CBC: Recent Labs  Lab 03/12/20 1010 03/13/20 0916  WBC  10.5 14.1*  HGB 13.7 15.0  HCT 47.4* 51.9*  MCV 94.6 94.7  PLT 73* 83*   Cardiac Enzymes: No results for input(s): CKTOTAL, CKMB, CKMBINDEX, TROPONINI in the last 168 hours. BNP: Invalid input(s): POCBNP CBG: Recent Labs  Lab 03/16/20 2107 03/17/20 0009 03/17/20 0320 03/17/20 0732 03/17/20 1106  GLUCAP 123* 149* 118* 105* 115*   D-Dimer No results for input(s): DDIMER in the last 72 hours. Hgb A1c No results for input(s): HGBA1C in the last 72 hours. Lipid Profile No results for input(s): CHOL, HDL, LDLCALC, TRIG, CHOLHDL, LDLDIRECT in the last 72 hours. Thyroid function studies No results for input(s): TSH, T4TOTAL, T3FREE, THYROIDAB in the last 72 hours.  Invalid input(s): FREET3 Anemia work up No results for input(s): VITAMINB12, FOLATE, FERRITIN, TIBC, IRON, RETICCTPCT in the last 72 hours. Urinalysis    Component Value Date/Time   COLORURINE YELLOW 03/13/2020 1200   APPEARANCEUR HAZY (A) 03/13/2020 1200   LABSPEC >1.030 (H) 03/13/2020 1200   PHURINE 5.0 03/13/2020 1200   GLUCOSEU 100 (A) 03/13/2020 1200   HGBUR LARGE (A) 03/13/2020 1200   BILIRUBINUR MODERATE (A) 03/13/2020 1200   KETONESUR NEGATIVE 03/13/2020 1200   PROTEINUR 30 (A) 03/13/2020 1200   UROBILINOGEN 0.2 08/03/2013 2205   NITRITE NEGATIVE 03/13/2020 1200   LEUKOCYTESUR TRACE (A) 03/13/2020 1200   Sepsis Labs Invalid input(s): PROCALCITONIN,  WBC,  LACTICIDVEN Microbiology Recent Results (from the past 240 hour(s))  Urine Culture     Status: Abnormal   Collection Time: 03/07/20 11:07 AM   Specimen: Urine, Random  Result Value Ref Range Status   Specimen Description   Final    URINE, RANDOM Performed at [facility], [address] [place], [place] [address]    Special Requests   Final    NONE Performed at [facility], [address] [place], [place] [address]    Culture >=100,000 COLONIES/mL ESCHERICHIA COLI (A)  Final   Report Status 03/09/2020 FINAL  Final   Organism ID, Bacteria  ESCHERICHIA COLI (A)  Final      Susceptibility   Escherichia coli - MIC*    AMPICILLIN >=32 RESISTANT Resistant     CEFAZOLIN <=4 SENSITIVE Sensitive     CEFTRIAXONE <=0.25 SENSITIVE Sensitive     CIPROFLOXACIN <=0.25 SENSITIVE Sensitive     GENTAMICIN <=1 SENSITIVE Sensitive     IMIPENEM <=0.25 SENSITIVE Sensitive     NITROFURANTOIN <=16 SENSITIVE Sensitive     TRIMETH/SULFA <=20 SENSITIVE Sensitive     AMPICILLIN/SULBACTAM >=32 RESISTANT Resistant     PIP/TAZO <=4 SENSITIVE Sensitive     * >=100,000 COLONIES/mL ESCHERICHIA COLI  Respiratory Panel by RT PCR (Flu A&B, Covid) - Nasopharyngeal Swab     Status:  None   Collection Time: 03/07/20 11:53 AM   Specimen: Nasopharyngeal Swab  Result Value Ref Range Status   SARS Coronavirus 2 by RT PCR NEGATIVE NEGATIVE Final    Comment: (NOTE) SARS-CoV-2 target nucleic acids are NOT DETECTED. The SARS-CoV-2 RNA is generally detectable in upper respiratoy specimens during the acute phase of infection. The lowest concentration of SARS-CoV-2 viral copies this assay can detect is 131 copies/mL. A negative result does not preclude SARS-Cov-2 infection and should not be used as the sole basis for treatment or other patient management decisions. A negative result may occur with  improper specimen collection/handling, submission of specimen other than nasopharyngeal swab, presence of viral mutation(s) within the areas targeted by this assay, and inadequate number of viral copies (<131 copies/mL). A negative result must be combined with clinical observations, patient history, and epidemiological information. The expected result is Negative. Fact Sheet for Patients:  PinkCheek.be Fact Sheet for Healthcare Providers:  GravelBags.it This test is not yet ap proved or cleared by the [place] FDA and  has been authorized for detection and/or diagnosis of SARS-CoV-2 by FDA under an Emergency  Use Authorization (EUA). This EUA will remain  in effect (meaning this test can be used) for the duration of the COVID-19 declaration under Section 564(b)(1) of the Act, 21 U.S.C. section 360bbb-3(b)(1), unless the authorization is terminated or revoked sooner.    Influenza A by PCR NEGATIVE NEGATIVE Final   Influenza B by PCR NEGATIVE NEGATIVE Final    Comment: (NOTE) The Xpert Xpress SARS-CoV-2/FLU/RSV assay is intended as an aid in  the diagnosis of influenza from Nasopharyngeal swab specimens and  should not be used as a sole basis for treatment. Nasal washings and  aspirates are unacceptable for Xpert Xpress SARS-CoV-2/FLU/RSV  testing. Fact Sheet for Patients: PinkCheek.be Fact Sheet for Healthcare Providers: GravelBags.it This test is not yet approved or cleared by the [place] FDA and  has been authorized for detection and/or diagnosis of SARS-CoV-2 by  FDA under an Emergency Use Authorization (EUA). This EUA will remain  in effect (meaning this test can be used) for the duration of the  Covid-19 declaration under Section 564(b)(1) of the Act, 21  U.S.C. section 360bbb-3(b)(1), unless the authorization is  terminated or revoked. Performed at [facility], [address], [place], [place] [address]   Culture, blood (Routine X 2) w Reflex to ID Panel     Status: None (Preliminary result)   Collection Time: 03/13/20  9:16 AM   Specimen: BLOOD RIGHT ARM  Result Value Ref Range Status   Specimen Description BLOOD RIGHT ARM  Final   Special Requests   Final    BOTTLES DRAWN AEROBIC AND ANAEROBIC Blood Culture adequate volume   Culture   Final    NO GROWTH 4 DAYS Performed at [facility], [address] [place], [place] [address]    Report Status PENDING  Incomplete  Culture, blood (Routine X 2) w Reflex to ID Panel     Status: Abnormal   Collection Time: 03/13/20  9:16 AM   Specimen: BLOOD  Result Value Ref  Range Status   Specimen Description   Final    BLOOD DRAWN BY IV THERAPY DRAWN BY RN Performed at [facility], [address] [place], [place] [address]    Special Requests   Final    BOTTLES DRAWN AEROBIC AND ANAEROBIC Blood Culture results may not be optimal due to an inadequate volume of blood received in culture bottles Performed  at [facility], [address] [place], [place] [address]    Culture  Setup Time   Final    IN BOTH AEROBIC AND ANAEROBIC BOTTLES GRAM POSITIVE COCCI Gram Stain Report Called to,Read Back By and Verified With: S WADE,RN @1945  03/13/20 [facility] Performed at [facility], [address], [place], [place] [address]    Culture (A)  Final    STAPHYLOCOCCUS SPECIES (COAGULASE NEGATIVE) THE SIGNIFICANCE OF ISOLATING THIS ORGANISM FROM A SINGLE SET OF BLOOD CULTURES WHEN MULTIPLE SETS ARE DRAWN IS UNCERTAIN. PLEASE NOTIFY THE MICROBIOLOGY DEPARTMENT WITHIN ONE WEEK IF SPECIATION AND SENSITIVITIES ARE REQUIRED. Performed at [facility], [place] [address] [place], [place] [address]    Report Status 03/16/2020 FINAL  Final  MRSA PCR Screening     Status: None   Collection Time: 03/13/20 11:59 AM   Specimen: Nasal Mucosa; Nasopharyngeal  Result Value Ref Range Status   MRSA by PCR NEGATIVE NEGATIVE Final    Comment:        The GeneXpert MRSA Assay (FDA approved for NASAL specimens only), is one component of a comprehensive MRSA colonization surveillance program. It is not intended to diagnose MRSA infection nor to guide or monitor treatment for MRSA infections. Performed at [facility], [address] [place], [place] [address]   Culture, Urine     Status: None   Collection Time: 03/13/20 12:00 PM   Specimen: Urine, Clean Catch  Result Value Ref Range Status   Specimen Description   Final    URINE, CLEAN CATCH Performed at [facility], [address] [place], [place] [address]    Special Requests   Final    NONE Performed at [facility],  [address] [place], [place] [address]    Culture   Final    NO GROWTH Performed at [facility], [place] [address] [place], [place] [address]    Report Status 03/14/2020 FINAL  Final  SARS CORONAVIRUS 2 (TAT 6-24 HRS) Nasopharyngeal Nasopharyngeal Swab     Status: None   Collection Time: 03/16/20  6:10 PM   Specimen: Nasopharyngeal Swab  Result Value Ref Range Status   SARS Coronavirus 2 NEGATIVE NEGATIVE Final    Comment: (NOTE) SARS-CoV-2 target nucleic acids are NOT DETECTED. The SARS-CoV-2 RNA is generally detectable in upper and lower respiratory specimens during the acute phase of infection. Negative results do not preclude SARS-CoV-2 infection, do not rule out co-infections with other pathogens, and should not be used as the sole basis for treatment or other patient management decisions. Negative results must be combined with clinical observations, patient history, and epidemiological information. The expected result is Negative. Fact Sheet for Patients: SugarRoll.be Fact Sheet for Healthcare Providers: https://www.woods-mathews.com/ This test is not yet approved or cleared by the [place] FDA and  has been authorized for detection and/or diagnosis of SARS-CoV-2 by FDA under an Emergency Use Authorization (EUA). This EUA will remain  in effect (meaning this test can be used) for the duration of the COVID-19 declaration under Section 56 4(b)(1) of the Act, 21 U.S.C. section 360bbb-3(b)(1), unless the authorization is terminated or revoked sooner. Performed at [facility], [place] [address] [place], [place] [address]    Time coordinating discharge: 38 minutes   SIGNED:  Irwin Brakeman, MD  [facility] 03/17/2020, 11:42 AM How to contact the [facility] Attending or Consulting provider [place] or covering provider during after hours [employer], for this patient?  1. Check the  care team in [facility] and look for a)  attending/consulting [place] provider listed and b) the [facility] team listed 2. Log into [email or web address] and use [place]'s universal password to access. If you do not have the password, please contact the hospital operator. 3. Locate the [facility] provider you are looking for under [facility] and page to a number that you can be directly reached. 4. If you still have difficulty reaching the provider, please page the [facility] (Director on Call) for the Hospitalists listed on amion for assistance.

## 2020-03-17 NOTE — Care Management (Signed)
Spoke with Tracy [name]) - patient is approved for [facility] [place] Approval # [id number] for 03/16/20-03/20/20

## 2020-03-17 NOTE — TOC Transition Note (Addendum)
Transition of Care [facility]) - CM/SW Discharge Note   Patient Details  Name: Tracy [name] MRN: [id number] Date of Birth: 1961-12-09  Transition of Care [facility] CM/SW Contact:  Boneta Lucks, RN Phone Number: 03/17/2020, 12:35 PM   Clinical Narrative:   Patient medically ready and Insurance auth received for [employer]  Updated Daughter - [name], she is at the bedside. RN to call report, CM scheduled EMS and printed medical necessity form. Clinicals sent to [facility] in the hub.    ADDENDUM:   Patient wanting to go home. Daughter at the bedside, Agrees to take her home. Patient lives alone, Daughter states they will have some one with her for now.  Patient is active with Lucile Shutters, MD placed orders, Georgina Snell updated.  Reviewed CHF education with Daughter.    Final next level of care: Skilled Nursing Facility Barriers to Discharge: Barriers Resolved   Patient Goals and CMS Choice Patient states their goals for this hospitalization and ongoing recovery are:: to go home CMS Medicare.gov Compare Post Acute Care list provided to:: Patient Represenative (must comment) Choice offered to / list presented to : Adult Children  Discharge Placement PASRR number recieved: 03/12/20            Patient chooses bed at: [facility]) Patient to be transferred to facility by: EMS Name of family member notified: [name] - daughter Patient and family notified of of transfer: 03/17/20  Discharge Plan and Services In-house Referral: Clinical Social Work   Post Acute Care Choice: [place]               Readmission Risk Interventions Readmission Risk Prevention Plan 03/12/2020 02/18/2020  Transportation Screening Complete Complete  Medication Review Press photographer) Complete Complete  PCP or Specialist appointment within 3-5 days of discharge - Not Complete  HRI or [place] - Complete  SW Recovery Care/Counseling Consult - Complete  Palliative  Care Screening - Not Complete  Skilled [place] - Not Complete  Some recent data might be hidden

## 2020-03-17 NOTE — Progress Notes (Signed)
03/17/2020 2:10 PM  Son and daughter at bedside called me and I met them in room.  They tell me that they are not agreeable with patient going to SNF.  They says that they never agree to patient going to [facility].  They say they have made arrangements for patient to have 24/7 supervision at home and they will care for her at home.  They would like to take patient home today instead of going to SNF.  I counseled them and explained to them that it was in the patient's best interest to go to SNF but they are adamant that they want her to go home.  I have placed some home health orders in and wish them the best.  Murvin Natal MD  How to contact the [facility] Attending or Consulting provider [place] or covering provider during after hours [place], for this patient?  1. Check the care team in [facility] and look for a) attending/consulting [facility] provider listed and b) the [facility] team listed 2. Log into [email or web address] and use [place] universal password to access. If you do not have the password, please contact the hospital operator. 3. Locate the [facility] provider you are looking for under [facility] and page to a number that you can be directly reached. 4. If you still have difficulty reaching the provider, please page the [facility] (Director on Call) for the Hospitalists listed on amion for assistance.

## 2020-03-17 NOTE — Progress Notes (Signed)
Patient ID: Tracy [name], female   DOB: 11/21/1961, 59 y.o.   MRN: [id number] S: Feels better.  Still responding well with po lasix and improving BUN/Cr. O:BP 111/85 (BP Location: Right Arm)   Pulse 92   Temp 98 F (36.7 C) (Axillary)   Resp 18   Ht 5\' 6"  (1.676 m)   Wt 109.5 kg   SpO2 96%   BMI 38.96 kg/m   Intake/Output Summary (Last 24 hours) at 03/17/2020 1116 Last data filed at 03/17/2020 0921 Gross per 24 hour  Intake 270 ml  Output 1700 ml  Net -1430 ml   Intake/Output: I/O last 3 completed shifts: In: 240 [P.O.:240] Out: 2900 [Urine:2900]  Intake/Output this shift:  Total I/O In: 270 [P.O.:240; I.V.:30] Out: -  Weight change:  Gen: morbidly obese, in NAD CVS: no rub Resp: cta Abd: obese, +BS, soft Ext: 2+ edema  Recent Labs  Lab 03/12/20 0633 03/13/20 0452 03/13/20 0916 03/14/20 0401 03/15/20 0341 03/16/20 0702 03/17/20 0707  NA 133* 134* 134* 138  138 139  138 138 138  K 4.8 5.3* 5.2* 4.7  4.6 3.7  3.7 3.5 3.6  CL 96* 96* 96* 99  99 102  102 100 100  CO2 20* 14* 13* 21*  21* 24  23 26 29   GLUCOSE 74 97 121* 120*  120* 104*  102* 120* 111*  BUN 89* 97* 101* 112*  112* 99*  98* 76* 58*  CREATININE 2.50* 2.92* 3.02* 2.73*  2.73* 2.11*  2.13* 1.54* 1.32*  ALBUMIN  --  2.5*  --  2.4*  2.4* 2.4*  2.4* 2.5* 2.4*  CALCIUM 9.0 9.0 9.1 8.6*  8.5* 8.3*  8.1* 8.4* 8.3*  PHOS  --   --   --  6.2* 4.0 2.8 2.3*  AST  --  33  --  44* 39  --   --   ALT  --  56*  --  61* 56*  --   --    Liver Function Tests: Recent Labs  Lab 03/13/20 0452 03/13/20 0452 03/14/20 0401 03/14/20 0401 03/15/20 0341 03/16/20 0702 03/17/20 0707  AST 33  --  44*  --  39  --   --   ALT 56*  --  61*  --  56*  --   --   ALKPHOS 278*  --  220*  --  202*  --   --   BILITOT 9.2*  --  10.6*  --  8.7*  --   --   PROT 5.3*  --  4.8*  --  4.8*  --   --   ALBUMIN 2.5*   < > 2.4*  2.4*   < > 2.4*  2.4* 2.5* 2.4*   < > = values in this interval not displayed.   No  results for input(s): LIPASE, AMYLASE in the last 168 hours. Recent Labs  Lab 03/13/20 0916  AMMONIA 26   CBC: Recent Labs  Lab 03/12/20 1010 03/13/20 0916  WBC 10.5 14.1*  HGB 13.7 15.0  HCT 47.4* 51.9*  MCV 94.6 94.7  PLT 73* 83*   Cardiac Enzymes: No results for input(s): CKTOTAL, CKMB, CKMBINDEX, TROPONINI in the last 168 hours. CBG: Recent Labs  Lab 03/16/20 2107 03/17/20 0009 03/17/20 0320 03/17/20 0732 03/17/20 1106  GLUCAP 123* 149* 118* 105* 115*    Iron Studies: No results for input(s): IRON, TIBC, TRANSFERRIN, FERRITIN in the last 72 hours. Studies/Results: No results found. [name] apixaban  5 mg Oral  BID  . budesonide (PULMICORT) nebulizer solution  0.25 mg Nebulization BID  . Chlorhexidine Gluconate Cloth  6 each Topical Daily  . Chlorhexidine Gluconate Cloth  6 each Topical Q0600  . diltiazem  30 mg Oral Q6H  . furosemide  80 mg Oral Daily  . insulin aspart  0-9 Units Subcutaneous TID WC  . ipratropium-albuterol  3 mL Nebulization TID  . metoprolol tartrate  50 mg Oral BID  . naLOXone (NARCAN)  injection  0.4 mg Intravenous Once  . nicotine  21 mg Transdermal Daily  . sodium chloride flush  10-40 mL Intracatheter Q12H  . sodium chloride flush  3 mL Intravenous Q12H    BMET    Component Value Date/Time   NA 138 03/17/2020 0707   K 3.6 03/17/2020 0707   CL 100 03/17/2020 0707   CO2 29 03/17/2020 0707   GLUCOSE 111 (H) 03/17/2020 0707   BUN 58 (H) 03/17/2020 0707   CREATININE 1.32 (H) 03/17/2020 0707   CALCIUM 8.3 (L) 03/17/2020 0707   GFRNONAA 44 (L) 03/17/2020 0707   GFRAA 51 (L) 03/17/2020 0707   CBC    Component Value Date/Time   WBC 14.1 (H) 03/13/2020 0916   RBC 5.48 (H) 03/13/2020 0916   HGB 15.0 03/13/2020 0916   HCT 51.9 (H) 03/13/2020 0916   PLT 83 (L) 03/13/2020 0916   MCV 94.7 03/13/2020 0916   MCH 27.4 03/13/2020 0916   MCHC 28.9 (L) 03/13/2020 0916   RDW 25.1 (H) 03/13/2020 0916   LYMPHSABS 0.8 03/08/2020 0548   MONOABS  0.2 03/08/2020 0548   EOSABS 0.0 03/08/2020 0548   BASOSABS 0.0 03/08/2020 0548    Assessment/Plan:  1. AKI/CKD stage 3b-4: in setting of acute on chronic CHF and likely cardiorenal syndrome as well as ischemic ATN in setting of A fib with RVR and hypotension. Diuresing well and SCr continues to improve and now at baseline.   1. Transitioned to po lasix 80 mg daily with good response 2. BUN/Cr continue to improve despite diuresis  3. No indication for HD at this time.  4. Nothing further to add and will sign off. 2. Acute on chronic respiratory failure- now out of SDU. 3. A fib with RVR- HR in the 120's and drops in BP noted. improved with diltiazem. 4. Acute on chronic diastolic CHF/Cor Pulmonale- diuresing well and will transition to po lasix today. 5. COPD- per primary svc 6. Sepsis due to E Coli UTI- off ceftriaxone.  7. Coagulopathy- elevated INR treated with vitamin K. Presumably due to chronic hepatic congestion from cor pulmonale/right heart failure.  8. Thrombocytopenia- chronic. 9. DM type 2- per primary svc 10. Hypokalemia- mild. Will need to replete and follow. 11. Hyponatremia- due to CHF and acute respiratory distress. Improving.  12. Disposition- poor compliance and multiple admissions with acute on chronic CHF due to nonadherence to meds and sodium restriction.  Poor medical insight and judgement.  Agree with discharge to SNF.  She can follow up with our office in 4-6 weeks following discharge if she chooses to change Nephrologist practices, otherwise she should follow up with Ramiro Harvest, PA-C. Donetta Potts, MD [employer] [phone]

## 2020-03-17 NOTE — Care Management Important Message (Signed)
Important Message  Patient Details  Name: Tracy [name] MRN: [id number] Date of Birth: 1961/06/23   Medicare Important Message Given:  Yes     [name] 03/17/2020, 2:10 PM

## 2020-03-17 NOTE — Discharge Instructions (Signed)
Information on my medicine - ELIQUIS (apixaban)  This medication education was reviewed with me or my healthcare representative as part of my discharge preparation.   Why was Eliquis prescribed for you? Eliquis was prescribed for you to reduce the risk of a blood clot forming that can cause a stroke if you have a medical condition called atrial fibrillation (a type of irregular heartbeat).  What do You need to know about Eliquis ? Take your Eliquis TWICE DAILY - one tablet in the morning and one tablet in the evening with or without food. If you have difficulty swallowing the tablet whole please discuss with your pharmacist how to take the medication safely.  Take Eliquis exactly as prescribed by your doctor and DO NOT stop taking Eliquis without talking to the doctor who prescribed the medication.  Stopping may increase your risk of developing a stroke.  Refill your prescription before you run out.  After discharge, you should have regular check-up appointments with your healthcare provider that is prescribing your Eliquis.  In the future your dose may need to be changed if your kidney function or weight changes by a significant amount or as you get older.  What do you do if you miss a dose? If you miss a dose, take it as soon as you remember on the same day and resume taking twice daily.  Do not take more than one dose of ELIQUIS at the same time to make up a missed dose.  Important Safety Information A possible side effect of Eliquis is bleeding. You should call your healthcare provider right away if you experience any of the following: ? Bleeding from an injury or your nose that does not stop. ? Unusual colored urine (red or dark brown) or unusual colored stools (red or black). ? Unusual bruising for unknown reasons. ? A serious fall or if you hit your head (even if there is no bleeding).  Some medicines may interact with Eliquis and might increase your risk of bleeding or  clotting while on Eliquis. To help avoid this, consult your healthcare provider or pharmacist prior to using any new prescription or non-prescription medications, including herbals, vitamins, non-steroidal anti-inflammatory drugs (NSAIDs) and supplements.  This website has more information on Eliquis (apixaban): http://www.eliquis.com/eliquis/home    IMPORTANT INFORMATION: PAY CLOSE ATTENTION   PHYSICIAN DISCHARGE INSTRUCTIONS  Follow with Primary care provider  Rory Percy, MD  and other consultants as instructed by your Hospitalist Physician  [place] IF SYMPTOMS COME BACK, WORSEN OR NEW PROBLEM DEVELOPS   Please note: You were cared for by a hospitalist during your hospital stay. Every effort will be made to forward records to your primary care provider.  You can request that your primary care provider send for your hospital records if they have not received them.  Once you are discharged, your primary care physician will handle any further medical issues. Please note that NO REFILLS for any discharge medications will be authorized once you are discharged, as it is imperative that you return to your primary care physician (or establish a relationship with a primary care physician if you do not have one) for your post hospital discharge needs so that they can reassess your need for medications and monitor your lab values.  Please get a complete blood count and chemistry panel checked by your Primary MD at your next visit, and again as instructed by your Primary MD.  Get Medicines reviewed and adjusted: Please take  all your medications with you for your next visit with your Primary MD  Laboratory/radiological data: Please request your Primary MD to go over all hospital tests and procedure/radiological results at the follow up, please ask your primary care provider to get all Hospital records sent to his/her office.  In some cases, they will be blood  work, cultures and biopsy results pending at the time of your discharge. Please request that your primary care provider follow up on these results.  If you are diabetic, please bring your blood sugar readings with you to your follow up appointment with primary care.    Please call and make your follow up appointments as soon as possible.    Also Note the following: If you experience worsening of your admission symptoms, develop shortness of breath, life threatening emergency, suicidal or homicidal thoughts you must seek medical attention immediately by calling 911 or calling your MD immediately  if symptoms less severe.  You must read complete instructions/literature along with all the possible adverse reactions/side effects for all the Medicines you take and that have been prescribed to you. Take any new Medicines after you have completely understood and accpet all the possible adverse reactions/side effects.   Do not drive when taking Pain medications or sleeping medications (Benzodiazepines)  Do not take more than prescribed Pain, Sleep and Anxiety Medications. It is not advisable to combine anxiety,sleep and pain medications without talking with your primary care practitioner  Special Instructions: If you have smoked or chewed Tobacco  in the last 2 yrs please stop smoking, stop any regular Alcohol  and or any Recreational drug use.  Wear Seat belts while driving.  Do not drive if taking any narcotic, mind altering or controlled substances or recreational drugs or alcohol.

## 2020-03-18 LAB — CULTURE, BLOOD (ROUTINE X 2)
Culture: NO GROWTH
Special Requests: ADEQUATE

## 2020-03-20 DIAGNOSIS — I0981 Rheumatic heart failure: Secondary | ICD-10-CM | POA: Diagnosis not present

## 2020-03-20 DIAGNOSIS — N179 Acute kidney failure, unspecified: Secondary | ICD-10-CM | POA: Diagnosis not present

## 2020-03-20 DIAGNOSIS — I5033 Acute on chronic diastolic (congestive) heart failure: Secondary | ICD-10-CM | POA: Diagnosis not present

## 2020-03-20 DIAGNOSIS — J9811 Atelectasis: Secondary | ICD-10-CM | POA: Diagnosis not present

## 2020-03-20 DIAGNOSIS — J441 Chronic obstructive pulmonary disease with (acute) exacerbation: Secondary | ICD-10-CM | POA: Diagnosis not present

## 2020-03-20 DIAGNOSIS — I480 Paroxysmal atrial fibrillation: Secondary | ICD-10-CM | POA: Diagnosis not present

## 2020-03-20 DIAGNOSIS — I081 Rheumatic disorders of both mitral and tricuspid valves: Secondary | ICD-10-CM | POA: Diagnosis not present

## 2020-03-20 DIAGNOSIS — D696 Thrombocytopenia, unspecified: Secondary | ICD-10-CM | POA: Diagnosis not present

## 2020-03-20 DIAGNOSIS — I4510 Unspecified right bundle-branch block: Secondary | ICD-10-CM | POA: Diagnosis not present

## 2020-03-20 DIAGNOSIS — E119 Type 2 diabetes mellitus without complications: Secondary | ICD-10-CM | POA: Diagnosis not present

## 2020-03-20 DIAGNOSIS — J9621 Acute and chronic respiratory failure with hypoxia: Secondary | ICD-10-CM | POA: Diagnosis not present

## 2020-03-20 DIAGNOSIS — M170 Bilateral primary osteoarthritis of knee: Secondary | ICD-10-CM | POA: Diagnosis not present

## 2020-03-20 DIAGNOSIS — J449 Chronic obstructive pulmonary disease, unspecified: Secondary | ICD-10-CM | POA: Diagnosis not present

## 2020-03-20 DIAGNOSIS — I700 Atherosclerosis of aorta: Secondary | ICD-10-CM | POA: Diagnosis not present

## 2020-03-20 DIAGNOSIS — I252 Old myocardial infarction: Secondary | ICD-10-CM | POA: Diagnosis not present

## 2020-03-20 DIAGNOSIS — I110 Hypertensive heart disease with heart failure: Secondary | ICD-10-CM | POA: Diagnosis not present

## 2020-03-20 DIAGNOSIS — E876 Hypokalemia: Secondary | ICD-10-CM | POA: Diagnosis not present

## 2020-03-20 DIAGNOSIS — R000 Tachycardia, unspecified: Secondary | ICD-10-CM | POA: Diagnosis not present

## 2020-03-20 DIAGNOSIS — I69351 Hemiplegia and hemiparesis following cerebral infarction affecting right dominant side: Secondary | ICD-10-CM | POA: Diagnosis not present

## 2020-03-21 DIAGNOSIS — I4510 Unspecified right bundle-branch block: Secondary | ICD-10-CM | POA: Diagnosis not present

## 2020-03-21 DIAGNOSIS — I0981 Rheumatic heart failure: Secondary | ICD-10-CM | POA: Diagnosis not present

## 2020-03-21 DIAGNOSIS — J441 Chronic obstructive pulmonary disease with (acute) exacerbation: Secondary | ICD-10-CM | POA: Diagnosis not present

## 2020-03-21 DIAGNOSIS — I081 Rheumatic disorders of both mitral and tricuspid valves: Secondary | ICD-10-CM | POA: Diagnosis not present

## 2020-03-21 DIAGNOSIS — J9621 Acute and chronic respiratory failure with hypoxia: Secondary | ICD-10-CM | POA: Diagnosis not present

## 2020-03-21 DIAGNOSIS — M170 Bilateral primary osteoarthritis of knee: Secondary | ICD-10-CM | POA: Diagnosis not present

## 2020-03-21 DIAGNOSIS — I110 Hypertensive heart disease with heart failure: Secondary | ICD-10-CM | POA: Diagnosis not present

## 2020-03-21 DIAGNOSIS — I700 Atherosclerosis of aorta: Secondary | ICD-10-CM | POA: Diagnosis not present

## 2020-03-21 DIAGNOSIS — E876 Hypokalemia: Secondary | ICD-10-CM | POA: Diagnosis not present

## 2020-03-21 DIAGNOSIS — N179 Acute kidney failure, unspecified: Secondary | ICD-10-CM | POA: Diagnosis not present

## 2020-03-21 DIAGNOSIS — D696 Thrombocytopenia, unspecified: Secondary | ICD-10-CM | POA: Diagnosis not present

## 2020-03-21 DIAGNOSIS — I5033 Acute on chronic diastolic (congestive) heart failure: Secondary | ICD-10-CM | POA: Diagnosis not present

## 2020-03-21 DIAGNOSIS — I480 Paroxysmal atrial fibrillation: Secondary | ICD-10-CM | POA: Diagnosis not present

## 2020-03-21 DIAGNOSIS — E119 Type 2 diabetes mellitus without complications: Secondary | ICD-10-CM | POA: Diagnosis not present

## 2020-03-21 DIAGNOSIS — I252 Old myocardial infarction: Secondary | ICD-10-CM | POA: Diagnosis not present

## 2020-03-21 DIAGNOSIS — J9811 Atelectasis: Secondary | ICD-10-CM | POA: Diagnosis not present

## 2020-03-21 DIAGNOSIS — R000 Tachycardia, unspecified: Secondary | ICD-10-CM | POA: Diagnosis not present

## 2020-03-21 DIAGNOSIS — I69351 Hemiplegia and hemiparesis following cerebral infarction affecting right dominant side: Secondary | ICD-10-CM | POA: Diagnosis not present

## 2020-03-23 ENCOUNTER — Other Ambulatory Visit: Payer: Self-pay

## 2020-03-23 DIAGNOSIS — J9611 Chronic respiratory failure with hypoxia: Secondary | ICD-10-CM | POA: Diagnosis not present

## 2020-03-23 DIAGNOSIS — R531 Weakness: Secondary | ICD-10-CM | POA: Diagnosis not present

## 2020-03-23 DIAGNOSIS — Z8673 Personal history of transient ischemic attack (TIA), and cerebral infarction without residual deficits: Secondary | ICD-10-CM | POA: Diagnosis not present

## 2020-03-23 DIAGNOSIS — R4781 Slurred speech: Secondary | ICD-10-CM | POA: Diagnosis not present

## 2020-03-23 NOTE — Patient Outreach (Signed)
[facility] [facility]) Care Management  03/23/2020  Tracy [name] 07-23-1961 [id number]   Medication Adherence call to [name] spoke with patients daughter ,patient is past due on Atorvastatin 10 mg,daughter explain patient is taking 1 tablet daily,patient has enough for another month and will order when she finished. [name] is showing past due under [place].   [place] Management Direct Dial [phone]  Fax [phone] [name]

## 2020-03-27 ENCOUNTER — Telehealth: Payer: Self-pay | Admitting: Cardiovascular Disease

## 2020-03-27 DIAGNOSIS — I081 Rheumatic disorders of both mitral and tricuspid valves: Secondary | ICD-10-CM | POA: Diagnosis not present

## 2020-03-27 DIAGNOSIS — J9811 Atelectasis: Secondary | ICD-10-CM | POA: Diagnosis not present

## 2020-03-27 DIAGNOSIS — I110 Hypertensive heart disease with heart failure: Secondary | ICD-10-CM | POA: Diagnosis not present

## 2020-03-27 DIAGNOSIS — J9611 Chronic respiratory failure with hypoxia: Secondary | ICD-10-CM | POA: Diagnosis not present

## 2020-03-27 DIAGNOSIS — I700 Atherosclerosis of aorta: Secondary | ICD-10-CM | POA: Diagnosis not present

## 2020-03-27 DIAGNOSIS — M170 Bilateral primary osteoarthritis of knee: Secondary | ICD-10-CM | POA: Diagnosis not present

## 2020-03-27 DIAGNOSIS — J441 Chronic obstructive pulmonary disease with (acute) exacerbation: Secondary | ICD-10-CM | POA: Diagnosis not present

## 2020-03-27 DIAGNOSIS — I509 Heart failure, unspecified: Secondary | ICD-10-CM | POA: Diagnosis not present

## 2020-03-27 DIAGNOSIS — E876 Hypokalemia: Secondary | ICD-10-CM | POA: Diagnosis not present

## 2020-03-27 DIAGNOSIS — I480 Paroxysmal atrial fibrillation: Secondary | ICD-10-CM | POA: Diagnosis not present

## 2020-03-27 DIAGNOSIS — I0981 Rheumatic heart failure: Secondary | ICD-10-CM | POA: Diagnosis not present

## 2020-03-27 DIAGNOSIS — N179 Acute kidney failure, unspecified: Secondary | ICD-10-CM | POA: Diagnosis not present

## 2020-03-27 DIAGNOSIS — E119 Type 2 diabetes mellitus without complications: Secondary | ICD-10-CM | POA: Diagnosis not present

## 2020-03-27 DIAGNOSIS — J9621 Acute and chronic respiratory failure with hypoxia: Secondary | ICD-10-CM | POA: Diagnosis not present

## 2020-03-27 DIAGNOSIS — D696 Thrombocytopenia, unspecified: Secondary | ICD-10-CM | POA: Diagnosis not present

## 2020-03-27 DIAGNOSIS — N184 Chronic kidney disease, stage 4 (severe): Secondary | ICD-10-CM | POA: Diagnosis not present

## 2020-03-27 DIAGNOSIS — R000 Tachycardia, unspecified: Secondary | ICD-10-CM | POA: Diagnosis not present

## 2020-03-27 DIAGNOSIS — I5033 Acute on chronic diastolic (congestive) heart failure: Secondary | ICD-10-CM | POA: Diagnosis not present

## 2020-03-27 DIAGNOSIS — J449 Chronic obstructive pulmonary disease, unspecified: Secondary | ICD-10-CM | POA: Diagnosis not present

## 2020-03-27 DIAGNOSIS — I252 Old myocardial infarction: Secondary | ICD-10-CM | POA: Diagnosis not present

## 2020-03-27 DIAGNOSIS — I69351 Hemiplegia and hemiparesis following cerebral infarction affecting right dominant side: Secondary | ICD-10-CM | POA: Diagnosis not present

## 2020-03-27 DIAGNOSIS — I4510 Unspecified right bundle-branch block: Secondary | ICD-10-CM | POA: Diagnosis not present

## 2020-03-27 NOTE — Telephone Encounter (Signed)
What has heart rate been?  Okay to hold metoprolol for now.

## 2020-03-27 NOTE — Telephone Encounter (Signed)
Spoke with daughter and she reports HR as 18.

## 2020-03-27 NOTE — Telephone Encounter (Signed)
[facility] called [phone] called in regards to patient being OT evaluation. States that her BP continued to stay low  Patient stated to Mr. Edwina Barth that she did not take her Metoprolol 50mg  this AM. Her BP readings were 96/64 @ 10:39am  86/60 @ 11:38am And 93/60 @ 11:43am.  The nurse at [facility] instructed patient not to take her medication until she heard from our office.  Mr. Edwina Barth requested to be called also with further instructions.

## 2020-03-28 ENCOUNTER — Other Ambulatory Visit: Payer: Self-pay

## 2020-03-28 ENCOUNTER — Other Ambulatory Visit (HOSPITAL_COMMUNITY)
Admission: RE | Admit: 2020-03-28 | Discharge: 2020-03-28 | Disposition: A | Discharge status: Home / Self Care | Payer: Medicare Other | Source: Ambulatory Visit | Attending: Student | Admitting: Student

## 2020-03-28 ENCOUNTER — Ambulatory Visit (INDEPENDENT_AMBULATORY_CARE_PROVIDER_SITE_OTHER): Payer: Medicare Other | Admitting: Student

## 2020-03-28 ENCOUNTER — Encounter: Payer: Self-pay | Admitting: Student

## 2020-03-28 VITALS — BP 106/64 | HR 74 | Temp 97.7°F | Ht 65.0 in | Wt 243.0 lb

## 2020-03-28 DIAGNOSIS — I5032 Chronic diastolic (congestive) heart failure: Secondary | ICD-10-CM

## 2020-03-28 DIAGNOSIS — N184 Chronic kidney disease, stage 4 (severe): Secondary | ICD-10-CM

## 2020-03-28 DIAGNOSIS — R609 Edema, unspecified: Secondary | ICD-10-CM | POA: Diagnosis not present

## 2020-03-28 DIAGNOSIS — I890 Lymphedema, not elsewhere classified: Secondary | ICD-10-CM

## 2020-03-28 DIAGNOSIS — Z79899 Other long term (current) drug therapy: Secondary | ICD-10-CM | POA: Insufficient documentation

## 2020-03-28 DIAGNOSIS — I480 Paroxysmal atrial fibrillation: Secondary | ICD-10-CM

## 2020-03-28 DIAGNOSIS — I2510 Atherosclerotic heart disease of native coronary artery without angina pectoris: Secondary | ICD-10-CM

## 2020-03-28 DIAGNOSIS — R000 Tachycardia, unspecified: Secondary | ICD-10-CM | POA: Diagnosis not present

## 2020-03-28 DIAGNOSIS — R0902 Hypoxemia: Secondary | ICD-10-CM | POA: Diagnosis not present

## 2020-03-28 DIAGNOSIS — R0689 Other abnormalities of breathing: Secondary | ICD-10-CM | POA: Diagnosis not present

## 2020-03-28 DIAGNOSIS — R531 Weakness: Secondary | ICD-10-CM | POA: Diagnosis not present

## 2020-03-28 LAB — BASIC METABOLIC PANEL
Anion gap: 12 (ref 5–15)
BUN: 58 mg/dL — ABNORMAL HIGH (ref 6–20)
CO2: 24 mmol/L (ref 22–32)
Calcium: 8.6 mg/dL — ABNORMAL LOW (ref 8.9–10.3)
Chloride: 103 mmol/L (ref 98–111)
Creatinine, Ser: 1.94 mg/dL — ABNORMAL HIGH (ref 0.44–1.00)
GFR calc Af Amer: 32 mL/min — ABNORMAL LOW (ref >60)
GFR calc non Af Amer: 28 mL/min — ABNORMAL LOW (ref >60)
Glucose, Bld: 138 mg/dL — ABNORMAL HIGH (ref 70–99)
Potassium: 4.2 mmol/L (ref 3.5–5.1)
Sodium: 139 mmol/L (ref 135–145)

## 2020-03-28 NOTE — Patient Instructions (Signed)
Medication Instructions:  Your physician recommends that you continue on your current medications as directed. Please refer to the Current Medication list given to you today.   Labwork: TODAY  bmet   Testing/Procedures: NONE  Follow-Up: Your physician recommends that you schedule a follow-up appointment in: 4-6 WEEKS    Any Other Special Instructions Will Be Listed Below (If Applicable).  You have been referred to Physical therapy for Lymphedema      If you need a refill on your cardiac medications before your next appointment, please call your pharmacy.

## 2020-03-28 NOTE — Telephone Encounter (Signed)
Pt came to see. Bernerd Pho, PA-C today.

## 2020-03-28 NOTE — Progress Notes (Signed)
Cardiology Office Note    Date:  03/28/2020   ID:  [name], Winning Jun 02, 1961, MRN [id number]  PCP:  Rory Percy, MD  Cardiologist: Kate Sable, MD    Chief Complaint  Patient presents with  . Hospitalization Follow-up    History of Present Illness:    Tracy [name] is a 59 y.o. 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   She most recently presented to [facility] ED on 03/07/2020 for evaluation of hypoglycemia and altered mental status. She was found to have sepsis secondary to E. coli UTI and also with COPD and CHF exacerbations. Cardiology was consulted in regards to her CHF and she responded well to IV Lasix 60 mg twice daily. Nephrology was ultimately consulted given a rise in creatinine (peaked at 3.02, improved to 1.32 on discharge) and she was transitioned to PO Lasix 80 mg daily at the time of discharge with weight at 254 lbs. She was ultimately to be discharged to SNF but she declined this and was discharged with home health. Lopressor was titrated from 25mg  BID to 50mg  BID during admission given elevated HR.   Home Health called the office on 3/29 reporting SBP in the 80's at times, therefore it was recommended to hold Lopressor.   In talking with the patient and her daughter today, there have been several challenges since returning home. The patient's daughter and son have been alternating staying with her 24/7 but she fell twice earlier today due to her legs giving out. She just started working with PT yesterday.   She denied any chest  pain or palpitations. No reported orthopnea or PND. She does have baseline dyspnea on exertion and is on 3L [place]. She has continued to experience lower extremity edema and has an open wound along her left leg. Was prescribed Keflex by her PCP last week.    Past Medical History:  Diagnosis Date  . Acute ischemic stroke ([place]) 07/03/2012  . Crack cocaine use   . Diabetes ([place])   . Diabetes mellitus ([place])   . DVT (deep venous thrombosis) ([place])   . Hemiparesis, acute ([place]) 07/04/2012  . Hypertension   . LVH (left ventricular hypertrophy) 07/04/2012   Ejection fraction 60%.  . Myocardial infarct, old   . Thrombocytopenia ([place]) 07/03/2012  . Tobacco abuse     Past Surgical History:  Procedure Laterality Date  . ABDOMINAL HYSTERECTOMY    . HERNIA REPAIR      Current Medications: Outpatient Medications Prior to Visit  Medication Sig Dispense Refill  . acetaminophen (TYLENOL) 325 MG tablet Take 2 tablets (650 mg total) by mouth every 6 (six) hours as needed for mild pain (or Fever >/= 101). 12 tablet 0  . albuterol (PROVENTIL) (2.5 MG/3ML) 0.083% nebulizer solution Take 3 mLs (2.5 mg total) by nebulization every 4 (four) hours as needed for wheezing or shortness of breath. 75 mL 12  . albuterol (VENTOLIN HFA) 108 (90 Base) MCG/ACT inhaler Inhale 2 puffs into the lungs every 4 (four) hours as needed for wheezing or shortness of breath. 18 g 2  . apixaban (ELIQUIS) 5 MG TABS tablet  Take 1 tablet (5 mg total) by mouth 2 (two) times daily. 60 tablet 0  . atorvastatin (LIPITOR) 10 MG tablet Take 1 tablet (10 mg total) by mouth every evening. 30 tablet 2  . budesonide-formoterol (SYMBICORT) 160-4.5 MCG/ACT inhaler Inhale 2 puffs into the lungs 2 (two) times daily. 1 Inhaler 12  . cephALEXin (KEFLEX) 250 MG capsule Take 250 mg by mouth 3 (three) times daily.    [name] diltiazem (CARDIZEM CD) 120 MG 24 hr capsule Take 1 capsule (120 mg total) by mouth daily. 30 capsule 0  . furosemide (LASIX) 80 MG tablet Take 1  tablet (80 mg total) by mouth daily. 30 tablet 0  . HYDROcodone-acetaminophen (NORCO) 7.5-325 MG tablet Take 1 tablet by mouth 2 (two) times daily as needed for severe pain. 10 tablet 0  . linagliptin (TRADJENTA) 5 MG TABS tablet Take 1 tablet (5 mg total) by mouth daily. 30 tablet 0  . mirtazapine (REMERON) 7.5 MG tablet Take 7.5 mg by mouth at bedtime.    . metoprolol tartrate (LOPRESSOR) 50 MG tablet Take 1 tablet (50 mg total) by mouth 2 (two) times daily. 60 tablet 0   No facility-administered medications prior to visit.     Allergies:   Patient has no known allergies.   Social History   Socioeconomic History  . Marital status: Widowed    Spouse name: Not on file  . Number of children: Not on file  . Years of education: Not on file  . Highest education level: Not on file  Occupational History  . Not on file  Tobacco Use  . Smoking status: Current Every Day Smoker    Packs/day: 1.00    Types: Cigarettes  . Smokeless tobacco: Never Used  Substance and Sexual Activity  . Alcohol use: No  . Drug use: No    Comment: last used 2 years ago  . Sexual activity: Never    Birth control/protection: Surgical  Other Topics Concern  . Not on file  Social History Narrative  . Not on file   Social Determinants of Health   Financial Resource Strain:   . Difficulty of Paying Living Expenses:   Food Insecurity:   . Worried About Charity fundraiser in the Last Year:   . Arboriculturist in the Last Year:   Transportation Needs:   . Film/video editor (Medical):   [name] Lack of Transportation (Non-Medical):   Physical Activity:   . Days of Exercise per Week:   . Minutes of Exercise per Session:   Stress:   . Feeling of Stress :   Social Connections:   . Frequency of Communication with Friends and Family:   . Frequency of Social Gatherings with Friends and Family:   . Attends Religious Services:   . Active Member of Clubs or Organizations:   . Attends Archivist  Meetings:   [name] Marital Status:      Family History:  The patient's family history includes Diabetes in her brother and mother; Heart disease in her father and mother; Hypertension in her father and mother; Stroke in her mother.   Review of Systems:   Please see the history of present illness.     General:  No chills, fever, night sweats or weight changes.  Cardiovascular:  No chest pain, orthopnea, palpitations, paroxysmal nocturnal dyspnea. Positive for dyspnea on exertion and edema.  Dermatological: No rash, lesions/masses Respiratory: No cough, dyspnea Urologic: No hematuria, dysuria Abdominal:   No nausea,  vomiting, diarrhea, bright red blood per rectum, melena, or hematemesis Neurologic:  No visual changes, wkns, changes in mental status. All other systems reviewed and are otherwise negative except as noted above.   Physical Exam:    VS:  BP 106/64   Pulse 74   Temp 97.7 F (36.5 C)   Ht 5\' 5"  (1.651 m)   Wt 243 lb (110.2 kg)   SpO2 94%   BMI 40.44 kg/m    General: Well developed, well nourished,female appearing in no acute distress. Head: Normocephalic, atraumatic, sclera non-icteric.  Neck: No carotid bruits. JVD not elevated.  Lungs: Respirations regular and unlabored, without wheezes or rales.  Heart: Irregularly irregular. No S3 or S4.  No murmur, no rubs, or gallops appreciated. Abdomen: Soft, non-tender, non-distended. No obvious abdominal masses. Msk:  Strength and tone appear normal for age. No obvious joint deformities or effusions. Extremities: 1+ pitting edema bilaterally. Loose dressings in place and abrasion noted along left leg. Erythema present but no clear or purulent drainage currently.  Distal pedal pulses are 2+ bilaterally. Neuro: Alert and oriented X 3. Moves all extremities spontaneously. No focal deficits noted. Psych:  Responds to questions appropriately with a normal affect. Skin: No rashes or lesions noted  Wt Readings from Last 3 Encounters:    03/28/20 243 lb (110.2 kg)  03/17/20 241 lb 6.5 oz (109.5 kg)  02/28/20 257 lb 6.4 oz (116.8 kg)     Studies/Labs Reviewed:   EKG:  EKG is not ordered today.   Recent Labs: 02/15/2020: TSH 1.960 03/13/2020: B Natriuretic Peptide 942.0; Hemoglobin 15.0; Platelets 83 03/15/2020: ALT 56 03/16/2020: Magnesium 1.9 03/28/2020: BUN 58; Creatinine, Ser 1.94; Potassium 4.2; Sodium 139   Lipid Panel    Component Value Date/Time   CHOL 150 07/04/2012 0523   TRIG 65 07/04/2012 0523   HDL 62 07/04/2012 0523   CHOLHDL 2.4 07/04/2012 0523   VLDL 13 07/04/2012 0523   LDLCALC 75 07/04/2012 0523    Additional studies/ records that were reviewed today include:   Echocardiogram: 01/2020 IMPRESSIONS    1. Left ventricular ejection fraction, by estimation, is 55 to 60%. The  left ventricle has normal function. The left ventricle has no regional  wall motion abnormalities. There is mild concentric left ventricular  hypertrophy. Left ventricular diastolic  parameters are indeterminate. Elevated left ventricular end-diastolic  pressure.  2. Right ventricular systolic function is severely reduced. The right  ventricular size is moderately enlarged. mildly increased right  ventricular wall thickness. There is moderately elevated pulmonary artery  systolic pressure.  3. Left atrial size was severely dilated.  4. Right atrial size was severely dilated.  5. The mitral valve is grossly normal. Mild mitral valve regurgitation.  6. Tricuspid valve regurgitation is moderate.  7. The aortic valve is tricuspid. Aortic valve regurgitation is not  visualized. No aortic stenosis is present.  8. The inferior vena cava is dilated in size with >50% respiratory  variability, suggesting right atrial pressure of 8 mmHg.   Assessment:    1. Chronic diastolic heart failure (HCC)   2. Lymphedema   3. Medication management   4. Coronary artery disease involving native coronary artery of native heart  without angina pectoris   5. Paroxysmal atrial fibrillation (HCC)   6. CKD (chronic kidney disease) stage 4, GFR 15-29 ml/min (HCC)      Plan:   In order of problems listed above:  1. Chronic Diastolic CHF/RV Dysfunction - recently admitted for an acute CHF exacerbation  with weight having declined by 11 lbs since returning home (254 --> 243 lbs). Family members have been trying to monitor her sodium intake.  - she does have pitting edema on examination but lungs are clear. She is currently on Lasix 80mg  daily. Will recheck BMET today. Given her decreased PO intake since discharge, may need to reduce Lasix dosing pending results.  - most recent echocardiogram did show severely reduced RV function. Suspect secondary to oxygen-dependent COPD. Will ultimately benefit from a sleep study once able to proceed. At this time, she is having frequent falls and progressive weakness, therefore would continue with PT and plan to arrange at follow-up. Could also consider a home sleep study option if this works best for the patient and her family.   2. Lymphedema - she has pitting edema on examination and an open wound along her left leg. Started on antibiotics by her PCP last week. Will refer to [facility] for further treatment and consideration of compression wraps.   3. CAD - history of this by review of notes but no records available. She did have a low-risk NST in 2019. Continue BB and statin therapy. She is not on ASA given the use of anticoagulation.   4. Paroxysmal Atrial Fibrillation - she denies any recent palpitations and HR is well-controlled during today's visit. Continue Cardizem CD 120mg  daily. Lopressor currently held given hypotension and would not restart at this time given her BP at 106/64. I did encourage them to continue to follow HR at home and report back if this was sustaining greater than 100 bpm.  - she was transitioned to Eliquis during her recent admission and denies any  evidence of active bleeding. Continue Eliquis 5mg  BID.   5. Stage 3-4 CKD - creatinine peaked at 3.02 during recent admission, improved to 1.32 on discharge. Will recheck BMET today. She does have follow-up with [facility] scheduled for next week.    Medication Adjustments/Labs and Tests Ordered: Current medicines are reviewed at length with the patient today.  Concerns regarding medicines are outlined above.  Medication changes, Labs and Tests ordered today are listed in the Patient Instructions below. Patient Instructions  Medication Instructions:  Your physician recommends that you continue on your current medications as directed. Please refer to the Current Medication list given to you today.   Labwork: TODAY  bmet   Testing/Procedures: NONE  Follow-Up: Your physician recommends that you schedule a follow-up appointment in: 4-6 WEEKS    Any Other Special Instructions Will Be Listed Below (If Applicable).  You have been referred to Physical therapy for Lymphedema      If you need a refill on your cardiac medications before your next appointment, please call your pharmacy.      Signed, Erma Heritage, PA-C  03/28/2020 7:36 PM    [facility] [address] [place], [place] [address] Phone: [phone] Fax: [phone]

## 2020-03-29 ENCOUNTER — Telehealth: Payer: Self-pay | Admitting: *Deleted

## 2020-03-29 DIAGNOSIS — J449 Chronic obstructive pulmonary disease, unspecified: Secondary | ICD-10-CM | POA: Diagnosis not present

## 2020-03-29 DIAGNOSIS — D696 Thrombocytopenia, unspecified: Secondary | ICD-10-CM | POA: Diagnosis not present

## 2020-03-29 DIAGNOSIS — J9621 Acute and chronic respiratory failure with hypoxia: Secondary | ICD-10-CM | POA: Diagnosis not present

## 2020-03-29 DIAGNOSIS — J441 Chronic obstructive pulmonary disease with (acute) exacerbation: Secondary | ICD-10-CM | POA: Diagnosis not present

## 2020-03-29 DIAGNOSIS — I0981 Rheumatic heart failure: Secondary | ICD-10-CM | POA: Diagnosis not present

## 2020-03-29 DIAGNOSIS — I69351 Hemiplegia and hemiparesis following cerebral infarction affecting right dominant side: Secondary | ICD-10-CM | POA: Diagnosis not present

## 2020-03-29 DIAGNOSIS — I480 Paroxysmal atrial fibrillation: Secondary | ICD-10-CM | POA: Diagnosis not present

## 2020-03-29 DIAGNOSIS — E876 Hypokalemia: Secondary | ICD-10-CM | POA: Diagnosis not present

## 2020-03-29 DIAGNOSIS — I700 Atherosclerosis of aorta: Secondary | ICD-10-CM | POA: Diagnosis not present

## 2020-03-29 DIAGNOSIS — M170 Bilateral primary osteoarthritis of knee: Secondary | ICD-10-CM | POA: Diagnosis not present

## 2020-03-29 DIAGNOSIS — I5033 Acute on chronic diastolic (congestive) heart failure: Secondary | ICD-10-CM | POA: Diagnosis not present

## 2020-03-29 DIAGNOSIS — I4510 Unspecified right bundle-branch block: Secondary | ICD-10-CM | POA: Diagnosis not present

## 2020-03-29 DIAGNOSIS — R000 Tachycardia, unspecified: Secondary | ICD-10-CM | POA: Diagnosis not present

## 2020-03-29 DIAGNOSIS — E119 Type 2 diabetes mellitus without complications: Secondary | ICD-10-CM | POA: Diagnosis not present

## 2020-03-29 DIAGNOSIS — J9811 Atelectasis: Secondary | ICD-10-CM | POA: Diagnosis not present

## 2020-03-29 DIAGNOSIS — I50812 Chronic right heart failure: Secondary | ICD-10-CM | POA: Diagnosis not present

## 2020-03-29 DIAGNOSIS — N179 Acute kidney failure, unspecified: Secondary | ICD-10-CM | POA: Diagnosis not present

## 2020-03-29 DIAGNOSIS — I252 Old myocardial infarction: Secondary | ICD-10-CM | POA: Diagnosis not present

## 2020-03-29 DIAGNOSIS — I110 Hypertensive heart disease with heart failure: Secondary | ICD-10-CM | POA: Diagnosis not present

## 2020-03-29 DIAGNOSIS — I081 Rheumatic disorders of both mitral and tricuspid valves: Secondary | ICD-10-CM | POA: Diagnosis not present

## 2020-03-29 MED ORDER — FUROSEMIDE 40 MG PO TABS: 60.0000 mg | ORAL_TABLET | Freq: Every day | ORAL | 11 refills | Status: DC

## 2020-03-29 NOTE — Telephone Encounter (Signed)
Order placed

## 2020-03-29 NOTE — Telephone Encounter (Signed)
-----   Message from Erma Heritage, [place] sent at 03/28/2020  4:12 PM EDT ----- Please let the patient and her daughter [name] [phone]) know her electrolytes remain stable but renal function has worsened since hospital discharge. Creatinine peaked at 3.02 during admission but had improved to 1.32 at discharge. Now elevated to 1.94. Would recommend dose reduction of Lasix to 60mg  daily. Keep scheduled follow-up with Nephrology. Please forward a copy of results to Rory Percy, MD and [facility].

## 2020-03-31 DIAGNOSIS — D696 Thrombocytopenia, unspecified: Secondary | ICD-10-CM | POA: Diagnosis not present

## 2020-03-31 DIAGNOSIS — I5033 Acute on chronic diastolic (congestive) heart failure: Secondary | ICD-10-CM | POA: Diagnosis not present

## 2020-03-31 DIAGNOSIS — E876 Hypokalemia: Secondary | ICD-10-CM | POA: Diagnosis not present

## 2020-03-31 DIAGNOSIS — E119 Type 2 diabetes mellitus without complications: Secondary | ICD-10-CM | POA: Diagnosis not present

## 2020-03-31 DIAGNOSIS — I081 Rheumatic disorders of both mitral and tricuspid valves: Secondary | ICD-10-CM | POA: Diagnosis not present

## 2020-03-31 DIAGNOSIS — J9811 Atelectasis: Secondary | ICD-10-CM | POA: Diagnosis not present

## 2020-03-31 DIAGNOSIS — N179 Acute kidney failure, unspecified: Secondary | ICD-10-CM | POA: Diagnosis not present

## 2020-03-31 DIAGNOSIS — I480 Paroxysmal atrial fibrillation: Secondary | ICD-10-CM | POA: Diagnosis not present

## 2020-03-31 DIAGNOSIS — I252 Old myocardial infarction: Secondary | ICD-10-CM | POA: Diagnosis not present

## 2020-03-31 DIAGNOSIS — J9621 Acute and chronic respiratory failure with hypoxia: Secondary | ICD-10-CM | POA: Diagnosis not present

## 2020-03-31 DIAGNOSIS — I110 Hypertensive heart disease with heart failure: Secondary | ICD-10-CM | POA: Diagnosis not present

## 2020-03-31 DIAGNOSIS — J441 Chronic obstructive pulmonary disease with (acute) exacerbation: Secondary | ICD-10-CM | POA: Diagnosis not present

## 2020-03-31 DIAGNOSIS — I0981 Rheumatic heart failure: Secondary | ICD-10-CM | POA: Diagnosis not present

## 2020-03-31 DIAGNOSIS — I4510 Unspecified right bundle-branch block: Secondary | ICD-10-CM | POA: Diagnosis not present

## 2020-03-31 DIAGNOSIS — M170 Bilateral primary osteoarthritis of knee: Secondary | ICD-10-CM | POA: Diagnosis not present

## 2020-03-31 DIAGNOSIS — R000 Tachycardia, unspecified: Secondary | ICD-10-CM | POA: Diagnosis not present

## 2020-03-31 DIAGNOSIS — I700 Atherosclerosis of aorta: Secondary | ICD-10-CM | POA: Diagnosis not present

## 2020-03-31 DIAGNOSIS — I69351 Hemiplegia and hemiparesis following cerebral infarction affecting right dominant side: Secondary | ICD-10-CM | POA: Diagnosis not present

## 2020-04-03 DIAGNOSIS — I129 Hypertensive chronic kidney disease with stage 1 through stage 4 chronic kidney disease, or unspecified chronic kidney disease: Secondary | ICD-10-CM | POA: Diagnosis not present

## 2020-04-03 DIAGNOSIS — N1832 Chronic kidney disease, stage 3b: Secondary | ICD-10-CM | POA: Diagnosis not present

## 2020-04-03 DIAGNOSIS — G8929 Other chronic pain: Secondary | ICD-10-CM | POA: Diagnosis not present

## 2020-04-03 DIAGNOSIS — Z9119 Patient's noncompliance with other medical treatment and regimen: Secondary | ICD-10-CM | POA: Diagnosis not present

## 2020-04-03 DIAGNOSIS — I5033 Acute on chronic diastolic (congestive) heart failure: Secondary | ICD-10-CM | POA: Diagnosis not present

## 2020-04-04 DIAGNOSIS — I69351 Hemiplegia and hemiparesis following cerebral infarction affecting right dominant side: Secondary | ICD-10-CM | POA: Diagnosis not present

## 2020-04-04 DIAGNOSIS — I5033 Acute on chronic diastolic (congestive) heart failure: Secondary | ICD-10-CM | POA: Diagnosis not present

## 2020-04-04 DIAGNOSIS — I081 Rheumatic disorders of both mitral and tricuspid valves: Secondary | ICD-10-CM | POA: Diagnosis not present

## 2020-04-04 DIAGNOSIS — I252 Old myocardial infarction: Secondary | ICD-10-CM | POA: Diagnosis not present

## 2020-04-04 DIAGNOSIS — R000 Tachycardia, unspecified: Secondary | ICD-10-CM | POA: Diagnosis not present

## 2020-04-04 DIAGNOSIS — J441 Chronic obstructive pulmonary disease with (acute) exacerbation: Secondary | ICD-10-CM | POA: Diagnosis not present

## 2020-04-04 DIAGNOSIS — E119 Type 2 diabetes mellitus without complications: Secondary | ICD-10-CM | POA: Diagnosis not present

## 2020-04-04 DIAGNOSIS — I480 Paroxysmal atrial fibrillation: Secondary | ICD-10-CM | POA: Diagnosis not present

## 2020-04-04 DIAGNOSIS — I110 Hypertensive heart disease with heart failure: Secondary | ICD-10-CM | POA: Diagnosis not present

## 2020-04-04 DIAGNOSIS — N179 Acute kidney failure, unspecified: Secondary | ICD-10-CM | POA: Diagnosis not present

## 2020-04-04 DIAGNOSIS — I0981 Rheumatic heart failure: Secondary | ICD-10-CM | POA: Diagnosis not present

## 2020-04-04 DIAGNOSIS — M170 Bilateral primary osteoarthritis of knee: Secondary | ICD-10-CM | POA: Diagnosis not present

## 2020-04-04 DIAGNOSIS — E876 Hypokalemia: Secondary | ICD-10-CM | POA: Diagnosis not present

## 2020-04-04 DIAGNOSIS — J9621 Acute and chronic respiratory failure with hypoxia: Secondary | ICD-10-CM | POA: Diagnosis not present

## 2020-04-04 DIAGNOSIS — I700 Atherosclerosis of aorta: Secondary | ICD-10-CM | POA: Diagnosis not present

## 2020-04-04 DIAGNOSIS — I4510 Unspecified right bundle-branch block: Secondary | ICD-10-CM | POA: Diagnosis not present

## 2020-04-04 DIAGNOSIS — J9811 Atelectasis: Secondary | ICD-10-CM | POA: Diagnosis not present

## 2020-04-04 DIAGNOSIS — D696 Thrombocytopenia, unspecified: Secondary | ICD-10-CM | POA: Diagnosis not present

## 2020-04-05 DIAGNOSIS — I0981 Rheumatic heart failure: Secondary | ICD-10-CM | POA: Diagnosis not present

## 2020-04-05 DIAGNOSIS — E119 Type 2 diabetes mellitus without complications: Secondary | ICD-10-CM | POA: Diagnosis not present

## 2020-04-05 DIAGNOSIS — M170 Bilateral primary osteoarthritis of knee: Secondary | ICD-10-CM | POA: Diagnosis not present

## 2020-04-05 DIAGNOSIS — I110 Hypertensive heart disease with heart failure: Secondary | ICD-10-CM | POA: Diagnosis not present

## 2020-04-05 DIAGNOSIS — N179 Acute kidney failure, unspecified: Secondary | ICD-10-CM | POA: Diagnosis not present

## 2020-04-05 DIAGNOSIS — I252 Old myocardial infarction: Secondary | ICD-10-CM | POA: Diagnosis not present

## 2020-04-05 DIAGNOSIS — D696 Thrombocytopenia, unspecified: Secondary | ICD-10-CM | POA: Diagnosis not present

## 2020-04-05 DIAGNOSIS — J9621 Acute and chronic respiratory failure with hypoxia: Secondary | ICD-10-CM | POA: Diagnosis not present

## 2020-04-05 DIAGNOSIS — I5033 Acute on chronic diastolic (congestive) heart failure: Secondary | ICD-10-CM | POA: Diagnosis not present

## 2020-04-05 DIAGNOSIS — I081 Rheumatic disorders of both mitral and tricuspid valves: Secondary | ICD-10-CM | POA: Diagnosis not present

## 2020-04-05 DIAGNOSIS — I480 Paroxysmal atrial fibrillation: Secondary | ICD-10-CM | POA: Diagnosis not present

## 2020-04-05 DIAGNOSIS — J441 Chronic obstructive pulmonary disease with (acute) exacerbation: Secondary | ICD-10-CM | POA: Diagnosis not present

## 2020-04-05 DIAGNOSIS — I69351 Hemiplegia and hemiparesis following cerebral infarction affecting right dominant side: Secondary | ICD-10-CM | POA: Diagnosis not present

## 2020-04-05 DIAGNOSIS — E876 Hypokalemia: Secondary | ICD-10-CM | POA: Diagnosis not present

## 2020-04-05 DIAGNOSIS — J9811 Atelectasis: Secondary | ICD-10-CM | POA: Diagnosis not present

## 2020-04-05 DIAGNOSIS — R000 Tachycardia, unspecified: Secondary | ICD-10-CM | POA: Diagnosis not present

## 2020-04-05 DIAGNOSIS — I700 Atherosclerosis of aorta: Secondary | ICD-10-CM | POA: Diagnosis not present

## 2020-04-05 DIAGNOSIS — I4510 Unspecified right bundle-branch block: Secondary | ICD-10-CM | POA: Diagnosis not present

## 2020-04-06 ENCOUNTER — Telehealth: Payer: Self-pay | Admitting: Orthopaedic Surgery

## 2020-04-06 DIAGNOSIS — M170 Bilateral primary osteoarthritis of knee: Secondary | ICD-10-CM | POA: Diagnosis not present

## 2020-04-06 DIAGNOSIS — I4510 Unspecified right bundle-branch block: Secondary | ICD-10-CM | POA: Diagnosis not present

## 2020-04-06 DIAGNOSIS — I252 Old myocardial infarction: Secondary | ICD-10-CM | POA: Diagnosis not present

## 2020-04-06 DIAGNOSIS — I110 Hypertensive heart disease with heart failure: Secondary | ICD-10-CM | POA: Diagnosis not present

## 2020-04-06 DIAGNOSIS — I69351 Hemiplegia and hemiparesis following cerebral infarction affecting right dominant side: Secondary | ICD-10-CM | POA: Diagnosis not present

## 2020-04-06 DIAGNOSIS — I5033 Acute on chronic diastolic (congestive) heart failure: Secondary | ICD-10-CM | POA: Diagnosis not present

## 2020-04-06 DIAGNOSIS — N179 Acute kidney failure, unspecified: Secondary | ICD-10-CM | POA: Diagnosis not present

## 2020-04-06 DIAGNOSIS — E119 Type 2 diabetes mellitus without complications: Secondary | ICD-10-CM | POA: Diagnosis not present

## 2020-04-06 DIAGNOSIS — J441 Chronic obstructive pulmonary disease with (acute) exacerbation: Secondary | ICD-10-CM | POA: Diagnosis not present

## 2020-04-06 DIAGNOSIS — J9811 Atelectasis: Secondary | ICD-10-CM | POA: Diagnosis not present

## 2020-04-06 DIAGNOSIS — J9621 Acute and chronic respiratory failure with hypoxia: Secondary | ICD-10-CM | POA: Diagnosis not present

## 2020-04-06 DIAGNOSIS — R000 Tachycardia, unspecified: Secondary | ICD-10-CM | POA: Diagnosis not present

## 2020-04-06 DIAGNOSIS — E876 Hypokalemia: Secondary | ICD-10-CM | POA: Diagnosis not present

## 2020-04-06 DIAGNOSIS — D696 Thrombocytopenia, unspecified: Secondary | ICD-10-CM | POA: Diagnosis not present

## 2020-04-06 DIAGNOSIS — I480 Paroxysmal atrial fibrillation: Secondary | ICD-10-CM | POA: Diagnosis not present

## 2020-04-06 DIAGNOSIS — I0981 Rheumatic heart failure: Secondary | ICD-10-CM | POA: Diagnosis not present

## 2020-04-06 DIAGNOSIS — I700 Atherosclerosis of aorta: Secondary | ICD-10-CM | POA: Diagnosis not present

## 2020-04-06 DIAGNOSIS — I081 Rheumatic disorders of both mitral and tricuspid valves: Secondary | ICD-10-CM | POA: Diagnosis not present

## 2020-04-06 MED ORDER — HYDROCODONE-ACETAMINOPHEN 7.5-325 MG PO TABS: 1.0000 | ORAL_TABLET | Freq: Two times a day (BID) | ORAL | 0 refills | Status: DC | PRN

## 2020-04-06 MED ORDER — HYDROCODONE-ACETAMINOPHEN 7.5-325 MG PO TABS: ORAL_TABLET | ORAL | 0 refills | Status: AC

## 2020-04-06 NOTE — Telephone Encounter (Signed)
Patient requests refill on Hydrocodone/Acetaminophen 7.5-325  Mgs.  Qty  60  Sig: Take 1 tablet by mouth every 6 (six) hours as needed for moderate pain (Must last 30 days).  Patient states she uses Product/process development scientist

## 2020-04-07 DIAGNOSIS — N179 Acute kidney failure, unspecified: Secondary | ICD-10-CM | POA: Diagnosis not present

## 2020-04-07 DIAGNOSIS — I69351 Hemiplegia and hemiparesis following cerebral infarction affecting right dominant side: Secondary | ICD-10-CM | POA: Diagnosis not present

## 2020-04-07 DIAGNOSIS — I0981 Rheumatic heart failure: Secondary | ICD-10-CM | POA: Diagnosis not present

## 2020-04-07 DIAGNOSIS — E119 Type 2 diabetes mellitus without complications: Secondary | ICD-10-CM | POA: Diagnosis not present

## 2020-04-07 DIAGNOSIS — I4510 Unspecified right bundle-branch block: Secondary | ICD-10-CM | POA: Diagnosis not present

## 2020-04-07 DIAGNOSIS — J441 Chronic obstructive pulmonary disease with (acute) exacerbation: Secondary | ICD-10-CM | POA: Diagnosis not present

## 2020-04-07 DIAGNOSIS — I252 Old myocardial infarction: Secondary | ICD-10-CM | POA: Diagnosis not present

## 2020-04-07 DIAGNOSIS — D696 Thrombocytopenia, unspecified: Secondary | ICD-10-CM | POA: Diagnosis not present

## 2020-04-07 DIAGNOSIS — I700 Atherosclerosis of aorta: Secondary | ICD-10-CM | POA: Diagnosis not present

## 2020-04-07 DIAGNOSIS — I081 Rheumatic disorders of both mitral and tricuspid valves: Secondary | ICD-10-CM | POA: Diagnosis not present

## 2020-04-07 DIAGNOSIS — R000 Tachycardia, unspecified: Secondary | ICD-10-CM | POA: Diagnosis not present

## 2020-04-07 DIAGNOSIS — J9811 Atelectasis: Secondary | ICD-10-CM | POA: Diagnosis not present

## 2020-04-07 DIAGNOSIS — I110 Hypertensive heart disease with heart failure: Secondary | ICD-10-CM | POA: Diagnosis not present

## 2020-04-07 DIAGNOSIS — M170 Bilateral primary osteoarthritis of knee: Secondary | ICD-10-CM | POA: Diagnosis not present

## 2020-04-07 DIAGNOSIS — I480 Paroxysmal atrial fibrillation: Secondary | ICD-10-CM | POA: Diagnosis not present

## 2020-04-07 DIAGNOSIS — I5033 Acute on chronic diastolic (congestive) heart failure: Secondary | ICD-10-CM | POA: Diagnosis not present

## 2020-04-07 DIAGNOSIS — E876 Hypokalemia: Secondary | ICD-10-CM | POA: Diagnosis not present

## 2020-04-07 DIAGNOSIS — J9621 Acute and chronic respiratory failure with hypoxia: Secondary | ICD-10-CM | POA: Diagnosis not present

## 2020-04-10 ENCOUNTER — Ambulatory Visit (HOSPITAL_COMMUNITY): Payer: Medicare Other | Attending: Student | Admitting: Physical Therapy

## 2020-04-10 ENCOUNTER — Other Ambulatory Visit: Payer: Self-pay

## 2020-04-10 ENCOUNTER — Encounter (HOSPITAL_COMMUNITY): Payer: Self-pay | Admitting: Physical Therapy

## 2020-04-10 DIAGNOSIS — R262 Difficulty in walking, not elsewhere classified: Secondary | ICD-10-CM | POA: Diagnosis not present

## 2020-04-10 DIAGNOSIS — S81802D Unspecified open wound, left lower leg, subsequent encounter: Secondary | ICD-10-CM

## 2020-04-10 DIAGNOSIS — I890 Lymphedema, not elsewhere classified: Secondary | ICD-10-CM | POA: Diagnosis not present

## 2020-04-10 NOTE — Therapy (Signed)
[facility] [place], [place], [address] Phone: [phone]   Fax:  [phone]  Physical Therapy Evaluation  Patient Details  Name: Tracy [name] MRN: [id number] Date of Birth: 1960/01/25 Referring Provider (PT): Benard Rink   Encounter Date: 04/10/2020  PT End of Session - 04/10/20 1435    Visit Number  1    Number of Visits  18    Date for PT Re-Evaluation  05/22/20    Progress Note Due on Visit  10    PT Start Time  5597    PT Stop Time  1610    PT Time Calculation (min)  85 min    Activity Tolerance  Patient tolerated treatment well       Past Medical History:  Diagnosis Date  . Acute ischemic stroke ([place]) 07/03/2012  . Crack cocaine use   . Diabetes ([place])   . Diabetes mellitus ([place])   . DVT (deep venous thrombosis) ([place])   . Hemiparesis, acute ([place]) 07/04/2012  . Hypertension   . LVH (left ventricular hypertrophy) 07/04/2012   Ejection fraction 60%.  . Myocardial infarct, old   . Thrombocytopenia ([place]) 07/03/2012  . Tobacco abuse     Past Surgical History:  Procedure Laterality Date  . ABDOMINAL HYSTERECTOMY    . HERNIA REPAIR      There were no vitals filed for this visit.   Subjective Assessment - 04/10/20 1431    Subjective  PT's daughter  states that Ms. Aarons has had swelling in her legs for years, ever since she had her stroke.  She began to have weeping, blisters and Wounds in February.  She was referred to the wound center who checked her arterial and venous flow which were normal, therefore, they told the family to care for her wound at home.  The wounds and swelling became severe enough that she was hospitalized in February for 6 days, again in March for 10 days.   Her legs are continuously weeping and nobody has been able to help her.  At this point she is having weeping coming from her thighs as well as her lower legs.  She is recieving Home health services for nursing, OT and PT at  this time but nobody is addressing her leg swelling, weeping and wounds.    Patient is accompained by:  Family member    Pertinent History  CAD, DVT, CVA, DM, COPD, HTN    Limitations  House hold activities    How long can you sit comfortably?  no problem    How long can you stand comfortably?  Needs UE support less than five minutes    How long can you walk comfortably?  with RW and contact guard assist no greater than 2 minutes.    Patient Stated Goals  wounds to heal, weeping to stop    Currently in Pain?  No/denies   but pain can go as high as an 8/10 unsure what causes it.           LYMPHEDEMA/ONCOLOGY QUESTIONNAIRE - 04/10/20 1527      What other symptoms do you have   Are you Having Heaviness or Tightness  Yes    Are you having Pain  Yes    Are you having pitting edema  Yes    Is it Hard or Difficult finding clothes that fit  Yes    Do you have infections  No    Stemmer Sign  Yes  Lymphedema Stage   Stage  STAGE 2 SPONTANEOUSLY IRREVERSIBLE      Lymphedema Assessments   Lymphedema Assessments  Lower extremities      Right Lower Extremity Lymphedema   20 cm Proximal to Suprapatella  70.5 cm    10 cm Proximal to Suprapatella  63 cm    At Midpatella/Popliteal Crease  52.7 cm    30 cm Proximal to Floor at Lateral Plantar Foot  48.3 cm    20 cm Proximal to Floor at Lateral Plantar Foot  33.8 1    10  cm Proximal to Floor at Lateral Malleoli  30 cm    Circumference of ankle/heel  38 cm.    5 cm Proximal to 1st MTP Joint  27 cm    Across MTP Joint  25.8 cm    Around Proximal Great Toe  9.5 cm      Left Lower Extremity Lymphedema   20 cm Proximal to Suprapatella  70 cm    10 cm Proximal to Suprapatella  66 cm    At Midpatella/Popliteal Crease  57 cm    30 cm Proximal to Floor at Lateral Plantar Foot  43.5 cm    20 cm Proximal to Floor at Lateral Plantar Foot  34.8 cm    10 cm Proximal to Floor at Lateral Malleoli  31.5 cm    Circumference of ankle/heel  37.8 cm.     5 cm Proximal to 1st MTP Joint  27.2 cm    Across MTP Joint  26.2 cm    Around Proximal Great Toe  10.3 cm             Objective measurements completed on examination: See above findings.     Wound Therapy - 04/10/20 1618    Wound Properties Date First Assessed: 04/10/20 Time First Assessed: 1459 Wound Type: Other (Comment) Location: Leg Location Orientation: Lower;Left;Lateral Wound Description (Comments):  Pt has multiple open areas that are weeping, scratch marks on anterior LE but largest wounds are laterall.  most superior of lateral  wounds   Present on Admission: Yes   Dressing Type  Gauze (Comment);Non adherent    Dressing Changed  Changed    Dressing Status  Old drainage    Dressing Change Frequency  PRN    Site / Wound Assessment  Dusky;Painful;Pink    % Wound base Red or Granulating  75%    % Wound base Yellow/Fibrinous Exudate  25%    Peri-wound Assessment  Edema    Wound Length (cm)  4.8 cm    Wound Width (cm)  7.5 cm    Wound Depth (cm)  0.2 cm    Wound Volume (cm^3)  7.2 cm^3    Wound Surface Area (cm^2)  36 cm^2    Drainage Amount  Copious   ? from wound or other weeping areas socks soaked    Drainage Description  Serous    Treatment  Cleansed    Wound Properties Date First Assessed: 04/10/20 Time First Assessed: 1523 Wound Type: Other (Comment) Location: Leg Location Orientation: Left;Distal;Lateral Present on Admission: Yes   Dressing Type  Gauze (Comment);Non adherent    Dressing Changed  Changed    Dressing Status  Old drainage    Site / Wound Assessment  Dusky;Painful;Pale    % Wound base Red or Granulating  75%    % Wound base Yellow/Fibrinous Exudate  25%    Peri-wound Assessment  Edema    Wound Length (cm)  4 cm  Wound Width (cm)  6.5 cm    Wound Depth (cm)  0.2 cm    Wound Volume (cm^3)  5.2 cm^3    Wound Surface Area (cm^2)  26 cm^2    Drainage Amount  Copious    Treatment  Debridement (Selective);Other (Comment)    Dressing   xeroform  to wounds, alginate over weeping areas followed by profore compression bandage               PT Education - 04/10/20 1434    Education Details  That medicare will not cover OP and HH and they will have to decide which service they want.  Daughter request OPas she has seen no improvement with HH and they are not doing anything for her mother's wounds.  Therapist explained  what lymphedema it,  why wounds occur with lymphedema, that it is progressive and no cure.  Therapist also explayed how we manage lymphedma and the treatment process including exercises.  Once profore was placed on therapist explained not to allow it to get wet and if daughter noted the dressing was getting wet from the weeping she should take it off, cleanse the wound and redress.    Person(s) Educated  Patient    Methods  Explanation;Handout    Comprehension  Verbalized understanding;Returned demonstration       PT Short Term Goals - 04/11/20 0801      PT SHORT TERM GOAL #1   Title  Pt to be completing her HEP to improve both lymph circulation as well as increasing endurance.    Time  3    Period  Weeks    Status  New    Target Date  05/01/20      PT SHORT TERM GOAL #2   Title  Pt weeping in both LE to have stopped to allow pt clothing to stay dry.    Time  3    Period  Weeks    Status  New      PT SHORT TERM GOAL #3   Title  PT volumes to have decreased by at least 3 cm in thighs and 2 cm LE to reduce the risk of cellulitis.    Time  3    Period  Weeks    Status  New      PT SHORT TERM GOAL #4   Title  Pt to be able to come sit to stand I with use of B UE>    Time  3    Status  New      PT SHORT TERM GOAL #5   Title  PT to be able to ambulate with rolling walker with mod I x 100 ft to be able ambulate safely in her home.    Time  3    Period  Weeks    Status  New        PT Long Term Goals - 04/10/20 1645      PT LONG TERM GOAL #1   Title  PT To have and be using a compression pump.     Time  6    Period  Weeks    Status  New    Target Date  05/23/20      PT LONG TERM GOAL #2   Title  PT to have and be able to don juxtafit garment or compression garment to be able to maintain volumes    Time  6    Period  Weeks  Status  New      PT LONG TERM GOAL #3   Title  Pt wounds on Lt LE to have decreased in size to no greater than 2x2 to allow family to feel confident in self care.    Time  6    Period  Weeks    Status  New      PT LONG TERM GOAL #4   Title  Pt to be able to ambulate with RW x 300 ft with mod I to allow pt to walk into Dr. visits/  visit family    Time  6    Period  Weeks    Status  New      PT LONG TERM GOAL #5   Title  Pt to be able to SLS for 10 seconds to reduce risk of falling    Time  6    Period  Weeks    Status  New     LOng Term Goal #6                                                              PT volume of thighs to have decreased 4-6cm; LE 3 cm                                                                                                To improve east of donning clothes   6 weeks        Plan - 04/10/20 1436    Clinical Impression Statement  Ms. Zell is a 59 yo female with long standing history of lymphedema. She has been hospitalized two times this year for ,"swelling and weeping of her legs".   She has noted increased edema in her abdomina, thighs and LE.  At this time she has had no lymphedema treatment for this diagnosis but is being referred to outpatient physical therapy for total decongestive techniques.  The patient is recieving HH PT /OT to improve her functional ability but not for lymphedema.  The therapist explained that she can not have both OP and [facility] services at the same time.  Daughter verbalized understanding and would prefer to have PT.  Evaluation demonstrates significant increased edema B, weeping from multiple areas including thighs and LE, increased pain, decreased activity tolerance, difficulty walking and  difficulty donning pants, shoes and socks.  Ms. Karl Luke will benefit from skilled PT to address these issues and maximize her funcional level as well as decrease the risk for cellulitis.    Personal Factors and Comorbidities  Comorbidity 3+;Fitness;Time since onset of injury/illness/exacerbation    Comorbidities  CAD, DVT, CVA COPD, HTN ,DM    Examination-Activity Limitations  Carry;Dressing;Hygiene/Grooming;Locomotion Level;Stairs;Stand    Examination-Participation Restrictions  Cleaning;Community Activity;Laundry    Stability/Clinical Decision Making  Evolving/Moderate complexity    Clinical Decision Making  Moderate    Rehab Potential  Good    PT  Frequency  3x / week    PT Duration  6 weeks    PT Treatment/Interventions  ADLs/Self Care Home Management;Manual lymph drainage;Compression bandaging;Therapeutic exercise    PT Next Visit Plan  Begin total decongestive techniques.  Short stretch bandaging with foam to RT LE, continue with profore to Lt until weeping decreases as to not soil the bandages.  Begin bandaging to knee area only to make sure pt can tolerate then progress to full LE, Begin wound care once order has been aquired.  Pt will need a biocompression as she has noted edma in upper thigh and abdominal area as well.  She will benefit from capri and knee high compression .    PT Home Exercise Plan  given ankle pumps, LAQ, hip ab/adduction, marching and diaphragmic breathing    Consulted and Agree with Plan of Care  Family member/caregiver;Patient       Patient will benefit from skilled therapeutic intervention in order to improve the following deficits and impairments:  Abnormal gait, Decreased activity tolerance, Decreased balance, Decreased endurance, Decreased strength, Difficulty walking, Increased edema  Visit Diagnosis: Lymphedema, not elsewhere classified  Open wound of left lower leg with complication, subsequent encounter  Difficulty in walking, not elsewhere  classified     Problem List Patient Active Problem List   Diagnosis Date Noted  . Acute metabolic encephalopathy 63/14/9702  . Acute on chronic respiratory failure with hypoxia ([facility]) 03/13/2020  . Sepsis due to Escherichia coli (E. coli) ([place]) 03/09/2020  . Acute cystitis with hematuria   . Elevated LFTs   . CKD (chronic kidney disease) stage 4, GFR 15-29 ml/min ([facility]) 03/08/2020  . Anasarca   . Poor intravenous access   . Acute on chronic diastolic CHF (congestive heart failure) ([place]) 03/07/2020  . Acute on chronic right-sided heart failure ([place]) 03/07/2020  . Chronic respiratory failure with hypoxia ([place]) 03/07/2020  . Acute lower UTI 03/07/2020  . Paroxysmal A-fib ([place]) 02/16/2020  . Acute exacerbation of CHF (congestive heart failure) ([place]) 02/15/2020  . Acute respiratory failure with hypoxia ([place]) 02/15/2020  . Acute on chronic heart failure with preserved ejection fraction (HFpEF) ([place]) 02/15/2020  . H/O: CVA (cerebrovascular accident)/ 2013 02/15/2020  . Diabetes mellitus type 2, uncontrolled ([place]) 02/15/2020  . Rt DVT (deep venous thrombosis) -2014 02/15/2020  . Anticoagulated on Coumadin 02/15/2020  . Right bundle branch block 09/05/2017  . Long term current use of anticoagulant therapy 03/06/2017  . Body mass index (BMI) of 40.0-44.9 in adult ([facility]) 02/20/2017  . Personal history of venous thrombosis and embolism 02/20/2017  . Umbilical hernia 63/78/5885  . Phlebitis and thrombophlebitis of lower extremities 01/13/2017  . Left knee pain 03/05/2016  . Influenza with respiratory manifestations 03/22/2015  . COPD exacerbation ([place]) 03/21/2015  . CAP (community acquired pneumonia) 07/04/2013  . Sepsis ([place]) 07/04/2013  . Acute renal failure ([place]) 07/04/2013  . Left thyroid nodule 07/04/2013  . Essential hypertension 07/04/2013  . Diabetes ([place]) 07/04/2013  . Acute respiratory failure ([place]) 07/04/2013  . Hemiparesis, acute ([place]) 07/04/2012  . LVH (left ventricular  hypertrophy) 07/04/2012  . Acute ischemic stroke ([place]) 07/03/2012  . Hypertensive urgency 07/03/2012  . Tobacco abuse 07/03/2012  . Noncompliance 07/03/2012  . Thrombocytopenia ([place]) 07/03/2012  . Crack cocaine use 07/03/2012    Rayetta Humphrey, PT CLT [phone] 04/11/2020, 8:24 AM  [facility] [address] [place], [place], [address] Phone: [phone]   Fax:  [phone]  Name: JORA GALLUZZO MRN: [id number]  Date of Birth: 15-Oct-1961

## 2020-04-12 ENCOUNTER — Ambulatory Visit (HOSPITAL_COMMUNITY): Payer: Medicare Other | Admitting: Physical Therapy

## 2020-04-12 ENCOUNTER — Other Ambulatory Visit: Payer: Self-pay

## 2020-04-12 DIAGNOSIS — R262 Difficulty in walking, not elsewhere classified: Secondary | ICD-10-CM

## 2020-04-12 DIAGNOSIS — S81802D Unspecified open wound, left lower leg, subsequent encounter: Secondary | ICD-10-CM | POA: Diagnosis not present

## 2020-04-12 DIAGNOSIS — I890 Lymphedema, not elsewhere classified: Secondary | ICD-10-CM

## 2020-04-12 NOTE — Therapy (Signed)
[place] [place] [facility] [place], [place], [address] Phone: [phone]   Fax:  [phone]  Physical Therapy Treatment  Patient Details  Name: Tracy [name] MRN: [id number] Date of Birth: 23-Sep-1961 Referring Provider (PT): Benard Rink   Encounter Date: 04/12/2020  PT End of Session - 04/12/20 1725    Visit Number  2    Number of Visits  18    Date for PT Re-Evaluation  05/22/20    Progress Note Due on Visit  10    PT Start Time  1455    PT Stop Time  1618    PT Time Calculation (min)  83 min    Activity Tolerance  Patient tolerated treatment well       Past Medical History:  Diagnosis Date  . Acute ischemic stroke ([place]) 07/03/2012  . Crack cocaine use   . Diabetes ([place])   . Diabetes mellitus ([place])   . DVT (deep venous thrombosis) ([place])   . Hemiparesis, acute ([place]) 07/04/2012  . Hypertension   . LVH (left ventricular hypertrophy) 07/04/2012   Ejection fraction 60%.  . Myocardial infarct, old   . Thrombocytopenia ([place]) 07/03/2012  . Tobacco abuse     Past Surgical History:  Procedure Laterality Date  . ABDOMINAL HYSTERECTOMY    . HERNIA REPAIR      There were no vitals filed for this visit.  Subjective Assessment - 04/12/20 1721    Subjective  pt states her legs hurt and her hips are weeping fluid.  STates her Lt leg hurts worse than her Rt.  Dressing still intact on Lt and pt on oxygen in wheelchair.    Currently in Pain?  Yes    Pain Score  --   no score given, just pain behaviors                    Wound Therapy - 04/12/20 1721    Wound Properties Date First Assessed: 04/10/20 Time First Assessed: 1459 Wound Type: Other (Comment) Location: Leg Location Orientation: Lower;Left;Lateral Wound Description (Comments):  Pt has multiple open areas that are weeping, scratch marks on anterior LE but largest wounds are medial.  most superior of medial wounds   Present on Admission: Yes   Dressing Type   Gauze (Comment);Non adherent    Dressing Changed  Changed    Dressing Status  Old drainage    Dressing Change Frequency  PRN    Site / Wound Assessment  Dusky;Painful    % Wound base Red or Granulating  75%    % Wound base Yellow/Fibrinous Exudate  25%    Peri-wound Assessment  Edema    Drainage Amount  Moderate    Drainage Description  Serous    Treatment  Cleansed;Debridement ([facility])    Wound Properties Date First Assessed: 04/10/20 Time First Assessed: 1523 Wound Type: Other (Comment) Location: Leg Location Orientation: Left;Distal;Lateral Present on Admission: Yes   Dressing Type  Gauze (Comment);Non adherent    Dressing Changed  Changed    Dressing Status  Old drainage    Site / Wound Assessment  Dusky;Painful;Pale    % Wound base Red or Granulating  75%    % Wound base Yellow/Fibrinous Exudate  25%    Peri-wound Assessment  Edema    Drainage Amount  Moderate    Treatment  Cleansed;Debridement ([place])    Dressing   xeroform to wounds, alginate over weeping areas followed by profore compression bandage  [facility] Adult PT Treatment/Exercise - 04/12/20 0001      Transfers   Transfers  Sit to Stand;Stand to Sit;Stand Pivot Transfers    Sit to Stand  3: Mod assist    Stand to Sit  4: Min assist    Stand Pivot Transfers  3: Mod assist    Comments  max cues for hand placement and instructions to complete transfers.  pt unable to scoot sideways or back onto mat.      Manual Therapy   Manual Therapy  Compression Bandaging    Manual therapy comments  unable to complete today due to time    Compression Bandaging  Profore to distal Lt LE, 1/2" foam and multiple short stretch bandaging to Rt LE.                 PT Short Term Goals - 04/11/20 0801      PT SHORT TERM GOAL #1   Title  Pt to be completing her HEP to improve both lymph circulation as well as increasing endurance.    Time  3    Period  Weeks    Status  New    Target Date  05/01/20      PT SHORT  TERM GOAL #2   Title  Pt weeping in both LE to have stopped to allow pt clothing to stay dry.    Time  3    Period  Weeks    Status  New      PT SHORT TERM GOAL #3   Title  PT volumes to have decreased by at least 3 cm in thighs and 2 cm LE to reduce the risk of cellulitis.    Time  3    Period  Weeks    Status  New      PT SHORT TERM GOAL #4   Title  Pt to be able to come sit to stand I with use of B UE>    Time  3    Status  New      PT SHORT TERM GOAL #5   Title  PT to be able to ambulate with rolling walker with mod I x 100 ft to be able ambulate safely in her home.    Time  3    Period  Weeks    Status  New        PT Long Term Goals - 04/10/20 1645      PT LONG TERM GOAL #1   Title  PT To have and be using a compression pump.    Time  6    Period  Weeks    Status  New    Target Date  05/23/20      PT LONG TERM GOAL #2   Title  PT to have and be able to don juxtafit garment or compression garment to be able to maintain volumes    Time  6    Period  Weeks    Status  New      PT LONG TERM GOAL #3   Title  Pt wounds on Lt LE to have decreased in size to no greater than 2x2 to allow family to feel confident in self care.    Time  6    Period  Weeks    Status  New      PT LONG TERM GOAL #4   Title  Pt to be able to ambulate with RW x 300 ft with mod  I to allow pt to walk into Dr. visits/  visit family    Time  6    Period  Weeks    Status  New      PT LONG TERM GOAL #5   Title  Pt to be able to SLS for 10 seconds to reduce risk of falling    Time  6    Period  Weeks    Status  New      Additional Long Term Goals   Additional Long Term Goals  Yes      PT LONG TERM GOAL #6   Title  PT to have lost 4-6 cm volume from thighs and 3 cm from LE to allow improved ease of donning clothing.    Time  6    Period  Weeks    Status  New            Plan - 04/12/20 1727    Clinical Impression Statement  Pt returns today in [facility] on 2LO2 and profore still intact  on Lt LE.  Pt requires max instruction and mod=max assist to safely transfer to mat from chair.  Pt then was unable to complete bed mobility without max-total assist from therapist.  After completing woundcare to Lt LE, cutting new foam for Rt LE and educating on bandaging, there was no time for manual lymph drainage to be initiated this session.    Personal Factors and Comorbidities  Comorbidity 3+;Fitness;Time since onset of injury/illness/exacerbation    Comorbidities  CAD, DVT, CVA COPD, HTN ,DM    Examination-Activity Limitations  Carry;Dressing;Hygiene/Grooming;Locomotion Level;Stairs;Stand    Examination-Participation Restrictions  Cleaning;Community Activity;Laundry    Stability/Clinical Decision Making  Evolving/Moderate complexity    Rehab Potential  Good    PT Frequency  3x / week    PT Duration  6 weeks    PT Treatment/Interventions  ADLs/Self Care Home Management;Manual lymph drainage;Compression bandaging;Therapeutic exercise    PT Next Visit Plan  Assess how well the short stretch done on Rt LE.  Add to Lt and on up into thigh when we receive foam and if patient tolerates well.  Begin total decongestive techniques.  Short stretch bandaging with foam to RT LE, continue with profore to Lt until weeping decreases as to not soil the bandages.  Begin bandaging to knee area only to make sure pt can tolerate then progress to full LE, Begin wound care once order has been aquired.  Pt will need a biocompression as she has noted edma in upper thigh and abdominal area as well.  She will benefit from capri and knee high compression .    PT Home Exercise Plan  given ankle pumps, LAQ, hip ab/adduction, marching and diaphragmic breathing    Consulted and Agree with Plan of Care  Family member/caregiver;Patient       Patient will benefit from skilled therapeutic intervention in order to improve the following deficits and impairments:  Abnormal gait, Decreased activity tolerance, Decreased balance,  Decreased endurance, Decreased strength, Difficulty walking, Increased edema  Visit Diagnosis: Open wound of left lower leg with complication, subsequent encounter  Difficulty in walking, not elsewhere classified  Lymphedema, not elsewhere classified     Problem List Patient Active Problem List   Diagnosis Date Noted  . Acute metabolic encephalopathy 30/16/0109  . Acute on chronic respiratory failure with hypoxia ([place]) 03/13/2020  . Sepsis due to Escherichia coli (E. coli) ([place]) 03/09/2020  . Acute cystitis with hematuria   . Elevated LFTs   .  CKD (chronic kidney disease) stage 4, GFR 15-29 ml/min (HCC) 03/08/2020  . Anasarca   . Poor intravenous access   . Acute on chronic diastolic CHF (congestive heart failure) ([place]) 03/07/2020  . Acute on chronic right-sided heart failure ([place]) 03/07/2020  . Chronic respiratory failure with hypoxia ([place]) 03/07/2020  . Acute lower UTI 03/07/2020  . Paroxysmal A-fib ([place]) 02/16/2020  . Acute exacerbation of CHF (congestive heart failure) ([place]) 02/15/2020  . Acute respiratory failure with hypoxia ([place]) 02/15/2020  . Acute on chronic heart failure with preserved ejection fraction (HFpEF) ([place]) 02/15/2020  . H/O: CVA (cerebrovascular accident)/ 2013 02/15/2020  . Diabetes mellitus type 2, uncontrolled ([place]) 02/15/2020  . Rt DVT (deep venous thrombosis) -2014 02/15/2020  . Anticoagulated on Coumadin 02/15/2020  . Right bundle branch block 09/05/2017  . Long term current use of anticoagulant therapy 03/06/2017  . Body mass index (BMI) of 40.0-44.9 in adult ([place]) 02/20/2017  . Personal history of venous thrombosis and embolism 02/20/2017  . Umbilical hernia 17/00/1749  . Phlebitis and thrombophlebitis of lower extremities 01/13/2017  . Left knee pain 03/05/2016  . Influenza with respiratory manifestations 03/22/2015  . COPD exacerbation ([place]) 03/21/2015  . CAP (community acquired pneumonia) 07/04/2013  . Sepsis ([place]) 07/04/2013  . Acute  renal failure ([place]) 07/04/2013  . Left thyroid nodule 07/04/2013  . Essential hypertension 07/04/2013  . Diabetes ([place]) 07/04/2013  . Acute respiratory failure ([place]) 07/04/2013  . Hemiparesis, acute ([place]) 07/04/2012  . LVH (left ventricular hypertrophy) 07/04/2012  . Acute ischemic stroke ([place]) 07/03/2012  . Hypertensive urgency 07/03/2012  . Tobacco abuse 07/03/2012  . Noncompliance 07/03/2012  . Thrombocytopenia ([place]) 07/03/2012  . Crack cocaine use 07/03/2012   Teena Irani, PTA/CLT [phone]  Teena Irani 04/12/2020, 5:37 PM  [facility] [address] [place], [place], [address] Phone: [phone]   Fax:  [phone]  Name: Tracy [name] MRN: [id number] Date of Birth: February 25, 1961

## 2020-04-14 ENCOUNTER — Encounter (HOSPITAL_COMMUNITY): Payer: Self-pay | Admitting: Physical Therapy

## 2020-04-14 ENCOUNTER — Other Ambulatory Visit: Payer: Self-pay | Admitting: Cardiovascular Disease

## 2020-04-14 ENCOUNTER — Other Ambulatory Visit: Payer: Self-pay

## 2020-04-14 ENCOUNTER — Ambulatory Visit (HOSPITAL_COMMUNITY): Payer: Medicare Other | Admitting: Physical Therapy

## 2020-04-14 DIAGNOSIS — I890 Lymphedema, not elsewhere classified: Secondary | ICD-10-CM

## 2020-04-14 DIAGNOSIS — R262 Difficulty in walking, not elsewhere classified: Secondary | ICD-10-CM | POA: Diagnosis not present

## 2020-04-14 DIAGNOSIS — S81802D Unspecified open wound, left lower leg, subsequent encounter: Secondary | ICD-10-CM | POA: Diagnosis not present

## 2020-04-14 MED ORDER — ATORVASTATIN CALCIUM 10 MG PO TABS: 10.0000 mg | ORAL_TABLET | Freq: Every evening | ORAL | 1 refills | Status: AC

## 2020-04-14 MED ORDER — DILTIAZEM HCL ER COATED BEADS 120 MG PO CP24: 120.0000 mg | ORAL_CAPSULE | Freq: Every day | ORAL | 1 refills | Status: AC

## 2020-04-14 MED ORDER — FUROSEMIDE 40 MG PO TABS: 60.0000 mg | ORAL_TABLET | Freq: Every day | ORAL | 1 refills | Status: AC

## 2020-04-14 MED ORDER — APIXABAN 5 MG PO TABS: 5.0000 mg | ORAL_TABLET | Freq: Two times a day (BID) | ORAL | 1 refills | Status: AC

## 2020-04-14 NOTE — Telephone Encounter (Signed)
Refilled cardiac meds to [facility]

## 2020-04-14 NOTE — Therapy (Signed)
[place], [place], [address] Phone: [phone]   Fax:  [phone]  Physical Therapy Treatment  Patient Details  Name: Tracy [name] MRN: [id number] Date of Birth: November 15, 1961 Referring Provider (PT): Benard Rink   Encounter Date: 04/14/2020  PT End of Session - 04/14/20 1039    Visit Number  3    Number of Visits  18    Date for PT Re-Evaluation  05/22/20    Progress Note Due on Visit  10    PT Start Time  0840    PT Stop Time  1005    PT Time Calculation (min)  85 min    Activity Tolerance  Patient tolerated treatment well       Past Medical History:  Diagnosis Date  . Acute ischemic stroke ([place]) 07/03/2012  . Crack cocaine use   . Diabetes ([place])   . Diabetes mellitus ([place])   . DVT (deep venous thrombosis) ([place])   . Hemiparesis, acute ([place]) 07/04/2012  . Hypertension   . LVH (left ventricular hypertrophy) 07/04/2012   Ejection fraction 60%.  . Myocardial infarct, old   . Thrombocytopenia ([place]) 07/03/2012  . Tobacco abuse     Past Surgical History:  Procedure Laterality Date  . ABDOMINAL HYSTERECTOMY    . HERNIA REPAIR      There were no vitals filed for this visit.  Subjective Assessment - 04/14/20 1027    Subjective  PT states that her leg pain is always at a 7-8.    Patient is accompained by:  Family member    Pertinent History  CAD, DVT, CVA, DM, COPD, HTN    Limitations  House hold activities    How long can you sit comfortably?  no problem    How long can you stand comfortably?  Needs UE support less than five minutes    How long can you walk comfortably?  with RW and contact guard assist no greater than 2 minutes.    Patient Stated Goals  wounds to heal, weeping to stop    Currently in Pain?  Yes    Pain Score  7     Pain Location  Leg    Pain Orientation  Left;Right    Pain Descriptors / Indicators  Aching    Pain Type  Chronic pain    Pain Onset  More than a month ago     Pain Frequency  Constant    Aggravating Factors   elevation    Pain Relieving Factors  meds    Effect of Pain on Daily Activities  limits                     Wound Therapy - 04/14/20 1037    Wound Properties Date First Assessed: 04/10/20 Time First Assessed: 1459 Wound Type: Other (Comment) Location: Leg Location Orientation: Lower;Left;Lateral Wound Description (Comments):  Pt has multiple open areas that are weeping, scratch marks on anterior LE but largest wounds are medial.  most superior of medial wounds   Present on Admission: Yes   Dressing Type  Gauze (Comment);Non adherent    Dressing Status  Old drainage    Dressing Change Frequency  PRN    Site / Wound Assessment  Dusky;Painful    % Wound base Red or Granulating  75%    % Wound base Yellow/Fibrinous Exudate  25%    Peri-wound Assessment  Edema    Drainage Amount  Scant  Drainage Description  Serous    Wound Properties Date First Assessed: 04/10/20 Time First Assessed: 1523 Wound Type: Other (Comment) Location: Leg Location Orientation: Left;Distal;Lateral Present on Admission: Yes   Dressing Type  Gauze (Comment);Non adherent    Dressing Status  Old drainage    Site / Wound Assessment  Dusky;Painful;Pale    % Wound base Red or Granulating  75%    % Wound base Yellow/Fibrinous Exudate  25%    Peri-wound Assessment  Edema    Drainage Amount  Scant    Treatment  Cleansed;Other (Comment)   moisturized    Dressing   silverhydrofiber  to wounds, alginate over weeping areas followed by profore compression bandage        [facility] Adult PT Treatment/Exercise - 04/14/20 0001      Transfers   Transfers  Sit to Stand;Stand to Sit;Stand Pivot Transfers    Sit to Stand  3: Mod assist    Stand to Sit  4: Min assist    Stand Pivot Transfers  3: Mod assist    Comments  max cues for hand placement and instructions to complete transfers.  pt unable to scoot sideways or back onto mat.      Exercises   Exercises  Knee/Hip    to improve lymphatic circulation.      Knee/Hip Exercises: Stretches   Other Knee/Hip Stretches  ankle pumps, LAQ, marching ,hip ab/adduciton , diaphragmic breathing x 10       Manual Therapy   Manual Therapy  Manual Lymphatic Drainage (MLD);Compression Bandaging    Manual therapy comments  done seperate from all other aspects of treatment     Manual Lymphatic Drainage (MLD)  to include supraclavicular, deep and superfical abdomial, as well as inguinal/axillary anastomosis and LE anterior only due to time     Compression Bandaging  Profore to distal Lt LE, 1/2" foam and multiple short stretch bandaging to Rt LE.                 PT Short Term Goals - 04/14/20 1049      PT SHORT TERM GOAL #1   Title  Pt to be completing her HEP to improve both lymph circulation as well as increasing endurance.    Time  3    Period  Weeks    Status  On-going    Target Date  05/01/20      PT SHORT TERM GOAL #2   Title  Pt weeping in both LE to have stopped to allow pt clothing to stay dry.    Time  3    Period  Weeks    Status  On-going      PT SHORT TERM GOAL #3   Title  PT volumes to have decreased by at least 3 cm in thighs and 2 cm LE to reduce the risk of cellulitis.    Time  3    Period  Weeks    Status  On-going      PT SHORT TERM GOAL #4   Title  Pt to be able to come sit to stand I with use of B UE>    Time  3    Status  On-going      PT SHORT TERM GOAL #5   Title  PT to be able to ambulate with rolling walker with mod I x 100 ft to be able ambulate safely in her home.    Time  3    Period  Weeks  Status  On-going        PT Long Term Goals - 04/14/20 1049      PT LONG TERM GOAL #1   Title  PT To have and be using a compression pump.    Time  6    Period  Weeks    Status  On-going      PT LONG TERM GOAL #2   Title  PT to have and be able to don juxtafit garment or compression garment to be able to maintain volumes    Time  6    Period  Weeks    Status   On-going      PT LONG TERM GOAL #3   Title  Pt wounds on Lt LE to have decreased in size to no greater than 2x2 to allow family to feel confident in self care.    Time  6    Period  Weeks    Status  On-going      PT LONG TERM GOAL #4   Title  Pt to be able to ambulate with RW x 300 ft with mod I to allow pt to walk into Dr. visits/  visit family    Time  6    Period  Weeks    Status  On-going      PT LONG TERM GOAL #5   Title  Pt to be able to SLS for 10 seconds to reduce risk of falling    Time  6    Period  Weeks    Status  On-going      PT LONG TERM GOAL #6   Title  PT to have lost 4-6 cm volume from thighs and 3 cm from LE to allow improved ease of donning clothing.    Time  6    Period  Weeks    Status  On-going            Plan - 04/14/20 1039    Clinical Impression Statement  Began exercises for lymphatic flow  as well as manual to pt.  Edema and weeping have improved significantly.  Therapist requested daughter to buy elephant pants or skirt so we can bandage Rt thigh next visit.    Personal Factors and Comorbidities  Comorbidity 3+;Fitness;Time since onset of injury/illness/exacerbation    Comorbidities  CAD, DVT, CVA COPD, HTN ,DM    Examination-Activity Limitations  Carry;Dressing;Hygiene/Grooming;Locomotion Level;Stairs;Stand    Examination-Participation Restrictions  Cleaning;Community Activity;Laundry    Stability/Clinical Decision Making  Evolving/Moderate complexity    Rehab Potential  Good    PT Frequency  3x / week    PT Duration  6 weeks    PT Treatment/Interventions  ADLs/Self Care Home Management;Manual lymph drainage;Compression bandaging;Therapeutic exercise    PT Next Visit Plan Measure wed Add to Rt   thigh when we receive foam and if patient tolerates well.  continue with profore to Lt until weeping decreases as to not soil the bandages.  Begin bandaging to knee area only to make sure pt can tolerate then progress to full LE, Begin wound care once  order has been aquired.  Pt will need a biocompression as she has noted edma in upper thigh and abdominal area as well.  She will benefit from capri and knee high compression .    PT Home Exercise Plan  given ankle pumps, LAQ, hip ab/adduction, marching and diaphragmic breathing    Consulted and Agree with Plan of Care  Family member/caregiver;Patient  Patient will benefit from skilled therapeutic intervention in order to improve the following deficits and impairments:  Abnormal gait, Decreased activity tolerance, Decreased balance, Decreased endurance, Decreased strength, Difficulty walking, Increased edema  Visit Diagnosis: Open wound of left lower leg with complication, subsequent encounter  Difficulty in walking, not elsewhere classified  Lymphedema, not elsewhere classified     Problem List Patient Active Problem List   Diagnosis Date Noted  . Acute metabolic encephalopathy 16/09/9603  . Acute on chronic respiratory failure with hypoxia ([place]) 03/13/2020  . Sepsis due to Escherichia coli (E. coli) ([place]) 03/09/2020  . Acute cystitis with hematuria   . Elevated LFTs   . CKD (chronic kidney disease) stage 4, GFR 15-29 ml/min (H[facility]) 03/08/2020  . Anasarca   . Poor intravenous access   . Acute on chronic diastolic CHF (congestive heart failure) ([place]) 03/07/2020  . Acute on chronic right-sided heart failure ([place]) 03/07/2020  . Chronic respiratory failure with hypoxia ([place]) 03/07/2020  . Acute lower UTI 03/07/2020  . Paroxysmal A-fib ([place]) 02/16/2020  . Acute exacerbation of CHF (congestive heart failure) ([place]) 02/15/2020  . Acute respiratory failure with hypoxia ([place]) 02/15/2020  . Acute on chronic heart failure with preserved ejection fraction (HFpEF) ([place]) 02/15/2020  . H/O: CVA (cerebrovascular accident)/ 2013 02/15/2020  . Diabetes mellitus type 2, uncontrolled ([place]) 02/15/2020  . Rt DVT (deep venous thrombosis) -2014 02/15/2020  . Anticoagulated on Coumadin 02/15/2020   . Right bundle branch block 09/05/2017  . Long term current use of anticoagulant therapy 03/06/2017  . Body mass index (BMI) of 40.0-44.9 in adult ([place]) 02/20/2017  . Personal history of venous thrombosis and embolism 02/20/2017  . Umbilical hernia 54/08/8118  . Phlebitis and thrombophlebitis of lower extremities 01/13/2017  . Left knee pain 03/05/2016  . Influenza with respiratory manifestations 03/22/2015  . COPD exacerbation ([place]) 03/21/2015  . CAP (community acquired pneumonia) 07/04/2013  . Sepsis ([place]) 07/04/2013  . Acute renal failure ([place]) 07/04/2013  . Left thyroid nodule 07/04/2013  . Essential hypertension 07/04/2013  . Diabetes ([place]) 07/04/2013  . Acute respiratory failure ([place]) 07/04/2013  . Hemiparesis, acute ([place]) 07/04/2012  . LVH (left ventricular hypertrophy) 07/04/2012  . Acute ischemic stroke ([place]) 07/03/2012  . Hypertensive urgency 07/03/2012  . Tobacco abuse 07/03/2012  . Noncompliance 07/03/2012  . Thrombocytopenia ([place]) 07/03/2012  . Crack cocaine use 07/03/2012   Rayetta Humphrey, PT CLT [phone] 04/14/2020, 10:51 AM  [facility] [address] [place], [place], [address] Phone: [phone]   Fax:  [phone]  Name: Tracy [name] MRN: [id number] Date of Birth: 03-01-1961

## 2020-04-14 NOTE — Telephone Encounter (Signed)
Pt has changed pharmacies to [facility]- please send Rx's there

## 2020-04-17 ENCOUNTER — Ambulatory Visit (HOSPITAL_COMMUNITY): Payer: Medicare Other | Admitting: Physical Therapy

## 2020-04-17 ENCOUNTER — Other Ambulatory Visit: Payer: Self-pay

## 2020-04-17 DIAGNOSIS — R262 Difficulty in walking, not elsewhere classified: Secondary | ICD-10-CM

## 2020-04-17 DIAGNOSIS — J9621 Acute and chronic respiratory failure with hypoxia: Secondary | ICD-10-CM | POA: Diagnosis not present

## 2020-04-17 DIAGNOSIS — I890 Lymphedema, not elsewhere classified: Secondary | ICD-10-CM

## 2020-04-17 DIAGNOSIS — I469 Cardiac arrest, cause unspecified: Secondary | ICD-10-CM | POA: Diagnosis not present

## 2020-04-17 DIAGNOSIS — S81802D Unspecified open wound, left lower leg, subsequent encounter: Secondary | ICD-10-CM

## 2020-04-17 NOTE — Therapy (Signed)
Tracy [name], [place], [address] Phone: [phone]   Fax:  [phone]  Physical Therapy Treatment  Patient Details  Name: Tracy [name] MRN: [id number] Date of Birth: [birth year] Referring Provider (PT): Benard Rink   Encounter Date: 04/17/2020  PT End of Session - 04/17/20 1813    Visit Number  4    Number of Visits  18    Date for PT Re-Evaluation  05/22/20    Progress Note Due on Visit  10    PT Start Time  1455    PT Stop Time  1615    PT Time Calculation (min)  80 min    Activity Tolerance  Patient tolerated treatment well       Past Medical History:  Diagnosis Date  . Acute ischemic stroke ([place]) 07/03/2012  . Crack cocaine use   . Diabetes ([place])   . Diabetes mellitus ([place])   . DVT (deep venous thrombosis) ([place])   . Hemiparesis, acute ([place]) 07/04/2012  . Hypertension   . LVH (left ventricular hypertrophy) 07/04/2012   Ejection fraction 60%.  . Myocardial infarct, old   . Thrombocytopenia ([place]) 07/03/2012  . Tobacco abuse     Past Surgical History:  Procedure Laterality Date  . ABDOMINAL HYSTERECTOMY    . HERNIA REPAIR      There were no vitals filed for this visit.  Subjective Assessment - 04/17/20 1811    Subjective  PT states that her leg pain is always at a 7-8.    Currently in Pain?  Yes    Pain Score  8     Pain Location  Leg    Pain Orientation  Right;Left    Pain Descriptors / Indicators  Aching;Grimacing    Pain Type  Chronic pain                     Wound Therapy - 04/17/20 1822    Wound Properties Date First Assessed: 04/10/20 Time First Assessed: 1459 Wound Type: Other (Comment) Location: Leg Location Orientation: Lower;Left;Lateral Wound Description (Comments):  Pt has multiple open areas that are weeping, scratch marks on anterior LE but largest wounds are medial.  most superior of medial wounds   Present on Admission: Yes   Dressing Type  Gauze  (Comment);Non adherent    Dressing Status  Old drainage    Dressing Change Frequency  PRN    Site / Wound Assessment  Dusky;Painful    % Wound base Red or Granulating  50%    % Wound base Yellow/Fibrinous Exudate  50%    Peri-wound Assessment  Edema    Drainage Amount  Minimal    Drainage Description  Serous    Treatment  Cleansed    Wound Properties Date First Assessed: 04/10/20 Time First Assessed: 1523 Wound Type: Other (Comment) Location: Leg Location Orientation: Left;Distal;Lateral Present on Admission: Yes   Dressing Type  Gauze (Comment);Non adherent    Dressing Changed  Changed    Dressing Status  Old drainage    Site / Wound Assessment  Dusky;Painful;Pale    % Wound base Red or Granulating  75%    % Wound base Yellow/Fibrinous Exudate  25%    Peri-wound Assessment  Edema    Drainage Amount  Minimal    Drainage Description  Serous    Treatment  Cleansed    Dressing   silverhydrofiber  to wounds, alginate over weeping areas followed by profore compression bandage  [place] Adult PT Treatment/Exercise - 04/17/20 0001      Transfers   Transfers  Sit to Stand;Stand to Sit;Stand Pivot Transfers    Sit to Stand  3: Mod assist    Stand to Sit  4: Min assist    Stand Pivot Transfers  3: Mod assist    Comments  max cues for hand placement and instructions to complete transfers.  pt unable to scoot sideways or back onto mat.      Manual Therapy   Manual Therapy  Manual Lymphatic Drainage (MLD);Compression Bandaging    Manual therapy comments  done seperate from all other aspects of treatment     Manual Lymphatic Drainage (MLD)  to include supraclavicular, deep and superfical abdomial, as well as inguinal/axillary anastomosis and LE anterior only due to time     Compression Bandaging  Profore to distal Lt LE, 1/2" foam and multiple short stretch bandaging to Rt LE.                 PT Short Term Goals - 04/14/20 1049      PT SHORT TERM GOAL #1   Title  Pt to be  completing her HEP to improve both lymph circulation as well as increasing endurance.    Time  3    Period  Weeks    Status  On-going    [place] Date  05/01/20      PT SHORT TERM GOAL #2   Title  Pt weeping in both LE to have stopped to allow pt clothing to stay dry.    Time  3    Period  Weeks    Status  On-going      PT SHORT TERM GOAL #3   Title  PT volumes to have decreased by at least 3 cm in thighs and 2 cm LE to reduce the risk of cellulitis.    Time  3    Period  Weeks    Status  On-going      PT SHORT TERM GOAL #4   Title  Pt to be able to come sit to stand I with use of B UE>    Time  3    Status  On-going      PT SHORT TERM GOAL #5   Title  PT to be able to ambulate with rolling walker with mod I x 100 ft to be able ambulate safely in her home.    Time  3    Period  Weeks    Status  On-going        PT Long Term Goals - 04/14/20 1049      PT LONG TERM GOAL #1   Title  PT To have and be using a compression pump.    Time  6    Period  Weeks    Status  On-going      PT LONG TERM GOAL #2   Title  PT to have and be able to don juxtafit garment or compression garment to be able to maintain volumes    Time  6    Period  Weeks    Status  On-going      PT LONG TERM GOAL #3   Title  Pt wounds on Lt LE to have decreased in size to no greater than 2x2 to allow family to feel confident in self care.    Time  6    Period  Weeks    Status  On-going  PT LONG TERM GOAL #4   Title  Pt to be able to ambulate with RW x 300 ft with mod I to allow pt to walk into Dr. visits/  visit family    Time  6    Period  Weeks    Status  On-going      PT LONG TERM GOAL #5   Title  Pt to be able to SLS for 10 seconds to reduce risk of falling    Time  6    Period  Weeks    Status  On-going      PT LONG TERM GOAL #6   Title  PT to have lost 4-6 cm volume from thighs and 3 cm from LE to allow improved ease of donning clothing.    Time  6    Period  Weeks    Status   On-going            Plan - 04/17/20 1829    Clinical Impression Statement  Pt with increased aggitation from pain and constant moaning, moving of LE's and other pain behaviours not experienced with last visits.  Pt was able to transfer with less help today from therapsit and scoot several inches before giving out.  Pt is able to lift LE to slide them to return seated at side of bed.  manaul completed to anterior aspect only and changed wound dressing to medihoney this session as noted necrosis taht is adherent in central wound Lt lateral superior aspect. No foam available to cut and begin care for thighs today.    Personal Factors and Comorbidities  Comorbidity 3+;Fitness;Time since onset of injury/illness/exacerbation    Comorbidities  CAD, DVT, CVA COPD, HTN ,DM    Examination-Activity Limitations  Carry;Dressing;Hygiene/Grooming;Locomotion Level;Stairs;Stand    Examination-Participation Restrictions  Cleaning;Community Activity;Laundry    Stability/Clinical Decision Making  Evolving/Moderate complexity    Rehab Potential  Good    PT Frequency  3x / week    PT Duration  6 weeks    PT Treatment/Interventions  ADLs/Self Care Home Management;Manual lymph drainage;Compression bandaging;Therapeutic exercise    PT Next Visit Plan  Add Rt thigh when we receive foam and if patient tolerates well.  continue with profore to Lt until weeping decreases as to not soil the bandages.  Bgin wound care once order has been aquired.  Pt will need a biocompression as she has noted edema n upper thigh and abdominal area as well.  She will benefit from capri and knee high compression .    PT Home Exercise Plan  given ankle pumps, LAQ, hip ab/adduction, marching and diaphragmic breathing    Consulted and Agree with Plan of Care  Family member/caregiver;Patient       Patient will benefit from skilled therapeutic intervention in order to improve the following deficits and impairments:  Abnormal gait, Decreased  activity tolerance, Decreased balance, Decreased endurance, Decreased strength, Difficulty walking, Increased edema  Visit Diagnosis: Difficulty in walking, not elsewhere classified  Lymphedema, not elsewhere classified  Open wound of left lower leg with complication, subsequent encounter     Problem List Patient Active Problem List   Diagnosis Date Noted  . Acute metabolic encephalopathy 29/79/8921  . Acute on chronic respiratory failure with hypoxia ([place]) 03/13/2020  . Sepsis due to Escherichia coli (E. coli) ([place]) 03/09/2020  . Acute cystitis with hematuria   . Elevated LFTs   . CKD (chronic kidney disease) stage 4, GFR 15-29 ml/min ([facility]) 03/08/2020  . Anasarca   .  Poor intravenous access   . Acute on chronic diastolic CHF (congestive heart failure) ([place]) 03/07/2020  . Acute on chronic right-sided heart failure ([place]) 03/07/2020  . Chronic respiratory failure with hypoxia ([place]) 03/07/2020  . Acute lower UTI 03/07/2020  . Paroxysmal A-fib ([place]) 02/16/2020  . Acute exacerbation of CHF (congestive heart failure) ([place]) 02/15/2020  . Acute respiratory failure with hypoxia ([place]) 02/15/2020  . Acute on chronic heart failure with preserved ejection fraction (HFpEF) ([place]) 02/15/2020  . H/O: CVA (cerebrovascular accident)/ 2013 02/15/2020  . Diabetes mellitus type 2, uncontrolled ([place]) 02/15/2020  . Rt DVT (deep venous thrombosis) -2014 02/15/2020  . Anticoagulated on Coumadin 02/15/2020  . Right bundle branch block 09/05/2017  . Long term current use of anticoagulant therapy 03/06/2017  . Body mass index (BMI) of 40.0-44.9 in adult ([place]) 02/20/2017  . Personal history of venous thrombosis and embolism 02/20/2017  . Umbilical hernia 53/61/4431  . Phlebitis and thrombophlebitis of lower extremities 01/13/2017  . Left knee pain 03/05/2016  . Influenza with respiratory manifestations 03/22/2015  . COPD exacerbation ([place]) 03/21/2015  . CAP (community acquired pneumonia) 07/04/2013  .  Sepsis ([place]) 07/04/2013  . Acute renal failure ([place]) 07/04/2013  . Left thyroid nodule 07/04/2013  . Essential hypertension 07/04/2013  . Diabetes ([place]) 07/04/2013  . Acute respiratory failure ([place]) 07/04/2013  . Hemiparesis, acute ([place]) 07/04/2012  . LVH (left ventricular hypertrophy) 07/04/2012  . Acute ischemic stroke ([place]) 07/03/2012  . Hypertensive urgency 07/03/2012  . Tobacco abuse 07/03/2012  . Noncompliance 07/03/2012  . Thrombocytopenia ([place]) 07/03/2012  . Crack cocaine use 07/03/2012   Teena Irani, PTA/CLT [phone]  Teena Irani 04/17/2020, 6:33 PM  [facility] [address] [place], [place], [address] Phone: [phone]   Fax:  [phone]  Name: MAKYNA NIEHOFF MRN: [id number] Date of Birth: 28-Aug-1961

## 2020-04-18 ENCOUNTER — Ambulatory Visit (HOSPITAL_COMMUNITY): Payer: Medicare Other

## 2020-04-18 ENCOUNTER — Inpatient Hospital Stay (HOSPITAL_COMMUNITY)
Admission: EM | Admit: 2020-04-18 | Discharge: 2020-04-29 | DRG: 207 | Disposition: E | Payer: Medicare Other | Attending: Critical Care Medicine | Admitting: Critical Care Medicine

## 2020-04-18 ENCOUNTER — Telehealth (HOSPITAL_COMMUNITY): Payer: Self-pay | Admitting: Physical Therapy

## 2020-04-18 ENCOUNTER — Inpatient Hospital Stay (HOSPITAL_COMMUNITY): Payer: Medicare Other

## 2020-04-18 ENCOUNTER — Emergency Department (HOSPITAL_COMMUNITY): Payer: Medicare Other

## 2020-04-18 ENCOUNTER — Ambulatory Visit: Payer: Medicare Other | Admitting: Orthopaedic Surgery

## 2020-04-18 DIAGNOSIS — I2729 Other secondary pulmonary hypertension: Secondary | ICD-10-CM | POA: Diagnosis present

## 2020-04-18 DIAGNOSIS — D509 Iron deficiency anemia, unspecified: Secondary | ICD-10-CM | POA: Diagnosis not present

## 2020-04-18 DIAGNOSIS — Z9981 Dependence on supplemental oxygen: Secondary | ICD-10-CM

## 2020-04-18 DIAGNOSIS — Z452 Encounter for adjustment and management of vascular access device: Secondary | ICD-10-CM | POA: Diagnosis not present

## 2020-04-18 DIAGNOSIS — Z8249 Family history of ischemic heart disease and other diseases of the circulatory system: Secondary | ICD-10-CM

## 2020-04-18 DIAGNOSIS — R680 Hypothermia, not associated with low environmental temperature: Secondary | ICD-10-CM | POA: Diagnosis present

## 2020-04-18 DIAGNOSIS — F17210 Nicotine dependence, cigarettes, uncomplicated: Secondary | ICD-10-CM | POA: Diagnosis present

## 2020-04-18 DIAGNOSIS — Z20822 Contact with and (suspected) exposure to covid-19: Secondary | ICD-10-CM | POA: Diagnosis not present

## 2020-04-18 DIAGNOSIS — I5032 Chronic diastolic (congestive) heart failure: Secondary | ICD-10-CM | POA: Diagnosis not present

## 2020-04-18 DIAGNOSIS — I472 Ventricular tachycardia: Secondary | ICD-10-CM | POA: Diagnosis present

## 2020-04-18 DIAGNOSIS — E872 Acidosis: Secondary | ICD-10-CM | POA: Diagnosis present

## 2020-04-18 DIAGNOSIS — J9601 Acute respiratory failure with hypoxia: Secondary | ICD-10-CM | POA: Diagnosis not present

## 2020-04-18 DIAGNOSIS — R7989 Other specified abnormal findings of blood chemistry: Secondary | ICD-10-CM

## 2020-04-18 DIAGNOSIS — Z993 Dependence on wheelchair: Secondary | ICD-10-CM

## 2020-04-18 DIAGNOSIS — I469 Cardiac arrest, cause unspecified: Secondary | ICD-10-CM | POA: Diagnosis not present

## 2020-04-18 DIAGNOSIS — R945 Abnormal results of liver function studies: Secondary | ICD-10-CM | POA: Diagnosis not present

## 2020-04-18 DIAGNOSIS — Z515 Encounter for palliative care: Secondary | ICD-10-CM | POA: Diagnosis not present

## 2020-04-18 DIAGNOSIS — D72829 Elevated white blood cell count, unspecified: Secondary | ICD-10-CM

## 2020-04-18 DIAGNOSIS — I499 Cardiac arrhythmia, unspecified: Secondary | ICD-10-CM | POA: Diagnosis not present

## 2020-04-18 DIAGNOSIS — D684 Acquired coagulation factor deficiency: Secondary | ICD-10-CM | POA: Diagnosis present

## 2020-04-18 DIAGNOSIS — I50813 Acute on chronic right heart failure: Secondary | ICD-10-CM | POA: Diagnosis not present

## 2020-04-18 DIAGNOSIS — K7200 Acute and subacute hepatic failure without coma: Secondary | ICD-10-CM | POA: Diagnosis present

## 2020-04-18 DIAGNOSIS — Z6841 Body Mass Index (BMI) 40.0 and over, adult: Secondary | ICD-10-CM

## 2020-04-18 DIAGNOSIS — R042 Hemoptysis: Secondary | ICD-10-CM | POA: Diagnosis present

## 2020-04-18 DIAGNOSIS — J9600 Acute respiratory failure, unspecified whether with hypoxia or hypercapnia: Secondary | ICD-10-CM | POA: Diagnosis not present

## 2020-04-18 DIAGNOSIS — I252 Old myocardial infarction: Secondary | ICD-10-CM

## 2020-04-18 DIAGNOSIS — I5082 Biventricular heart failure: Secondary | ICD-10-CM | POA: Diagnosis present

## 2020-04-18 DIAGNOSIS — K7201 Acute and subacute hepatic failure with coma: Secondary | ICD-10-CM | POA: Diagnosis not present

## 2020-04-18 DIAGNOSIS — Z841 Family history of disorders of kidney and ureter: Secondary | ICD-10-CM

## 2020-04-18 DIAGNOSIS — G931 Anoxic brain damage, not elsewhere classified: Secondary | ICD-10-CM | POA: Diagnosis present

## 2020-04-18 DIAGNOSIS — Z9911 Dependence on respirator [ventilator] status: Secondary | ICD-10-CM | POA: Diagnosis not present

## 2020-04-18 DIAGNOSIS — I890 Lymphedema, not elsewhere classified: Secondary | ICD-10-CM | POA: Diagnosis present

## 2020-04-18 DIAGNOSIS — J441 Chronic obstructive pulmonary disease with (acute) exacerbation: Secondary | ICD-10-CM | POA: Diagnosis not present

## 2020-04-18 DIAGNOSIS — N170 Acute kidney failure with tubular necrosis: Secondary | ICD-10-CM | POA: Diagnosis not present

## 2020-04-18 DIAGNOSIS — Z86718 Personal history of other venous thrombosis and embolism: Secondary | ICD-10-CM

## 2020-04-18 DIAGNOSIS — R6521 Severe sepsis with septic shock: Secondary | ICD-10-CM | POA: Diagnosis not present

## 2020-04-18 DIAGNOSIS — Z7901 Long term (current) use of anticoagulants: Secondary | ICD-10-CM

## 2020-04-18 DIAGNOSIS — Z66 Do not resuscitate: Secondary | ICD-10-CM | POA: Diagnosis not present

## 2020-04-18 DIAGNOSIS — R918 Other nonspecific abnormal finding of lung field: Secondary | ICD-10-CM

## 2020-04-18 DIAGNOSIS — I5021 Acute systolic (congestive) heart failure: Secondary | ICD-10-CM | POA: Diagnosis not present

## 2020-04-18 DIAGNOSIS — K761 Chronic passive congestion of liver: Secondary | ICD-10-CM | POA: Diagnosis present

## 2020-04-18 DIAGNOSIS — E1122 Type 2 diabetes mellitus with diabetic chronic kidney disease: Secondary | ICD-10-CM | POA: Diagnosis present

## 2020-04-18 DIAGNOSIS — E11649 Type 2 diabetes mellitus with hypoglycemia without coma: Secondary | ICD-10-CM | POA: Diagnosis not present

## 2020-04-18 DIAGNOSIS — K920 Hematemesis: Secondary | ICD-10-CM

## 2020-04-18 DIAGNOSIS — I480 Paroxysmal atrial fibrillation: Secondary | ICD-10-CM | POA: Diagnosis not present

## 2020-04-18 DIAGNOSIS — D696 Thrombocytopenia, unspecified: Secondary | ICD-10-CM | POA: Diagnosis present

## 2020-04-18 DIAGNOSIS — R7401 Elevation of levels of liver transaminase levels: Secondary | ICD-10-CM | POA: Diagnosis not present

## 2020-04-18 DIAGNOSIS — Z833 Family history of diabetes mellitus: Secondary | ICD-10-CM

## 2020-04-18 DIAGNOSIS — E785 Hyperlipidemia, unspecified: Secondary | ICD-10-CM | POA: Diagnosis present

## 2020-04-18 DIAGNOSIS — E1165 Type 2 diabetes mellitus with hyperglycemia: Secondary | ICD-10-CM | POA: Diagnosis not present

## 2020-04-18 DIAGNOSIS — J9611 Chronic respiratory failure with hypoxia: Secondary | ICD-10-CM | POA: Diagnosis not present

## 2020-04-18 DIAGNOSIS — Z823 Family history of stroke: Secondary | ICD-10-CM

## 2020-04-18 DIAGNOSIS — G40909 Epilepsy, unspecified, not intractable, without status epilepticus: Secondary | ICD-10-CM | POA: Diagnosis not present

## 2020-04-18 DIAGNOSIS — J9602 Acute respiratory failure with hypercapnia: Secondary | ICD-10-CM

## 2020-04-18 DIAGNOSIS — J449 Chronic obstructive pulmonary disease, unspecified: Secondary | ICD-10-CM | POA: Diagnosis not present

## 2020-04-18 DIAGNOSIS — J9622 Acute and chronic respiratory failure with hypercapnia: Secondary | ICD-10-CM | POA: Diagnosis present

## 2020-04-18 DIAGNOSIS — Z4682 Encounter for fitting and adjustment of non-vascular catheter: Secondary | ICD-10-CM | POA: Diagnosis not present

## 2020-04-18 DIAGNOSIS — I5023 Acute on chronic systolic (congestive) heart failure: Secondary | ICD-10-CM | POA: Diagnosis not present

## 2020-04-18 DIAGNOSIS — N186 End stage renal disease: Secondary | ICD-10-CM | POA: Diagnosis not present

## 2020-04-18 DIAGNOSIS — J9621 Acute and chronic respiratory failure with hypoxia: Principal | ICD-10-CM | POA: Diagnosis present

## 2020-04-18 DIAGNOSIS — N390 Urinary tract infection, site not specified: Secondary | ICD-10-CM | POA: Diagnosis not present

## 2020-04-18 DIAGNOSIS — R34 Anuria and oliguria: Secondary | ICD-10-CM | POA: Diagnosis not present

## 2020-04-18 DIAGNOSIS — I132 Hypertensive heart and chronic kidney disease with heart failure and with stage 5 chronic kidney disease, or end stage renal disease: Secondary | ICD-10-CM | POA: Diagnosis not present

## 2020-04-18 DIAGNOSIS — I129 Hypertensive chronic kidney disease with stage 1 through stage 4 chronic kidney disease, or unspecified chronic kidney disease: Secondary | ICD-10-CM | POA: Diagnosis not present

## 2020-04-18 DIAGNOSIS — N179 Acute kidney failure, unspecified: Secondary | ICD-10-CM | POA: Diagnosis not present

## 2020-04-18 DIAGNOSIS — N189 Chronic kidney disease, unspecified: Secondary | ICD-10-CM | POA: Diagnosis not present

## 2020-04-18 DIAGNOSIS — Z7189 Other specified counseling: Secondary | ICD-10-CM

## 2020-04-18 DIAGNOSIS — R188 Other ascites: Secondary | ICD-10-CM | POA: Diagnosis not present

## 2020-04-18 DIAGNOSIS — K828 Other specified diseases of gallbladder: Secondary | ICD-10-CM | POA: Diagnosis not present

## 2020-04-18 DIAGNOSIS — A4151 Sepsis due to Escherichia coli [E. coli]: Secondary | ICD-10-CM | POA: Diagnosis not present

## 2020-04-18 DIAGNOSIS — I361 Nonrheumatic tricuspid (valve) insufficiency: Secondary | ICD-10-CM | POA: Diagnosis not present

## 2020-04-18 DIAGNOSIS — Z8673 Personal history of transient ischemic attack (TIA), and cerebral infarction without residual deficits: Secondary | ICD-10-CM

## 2020-04-18 DIAGNOSIS — D539 Nutritional anemia, unspecified: Secondary | ICD-10-CM | POA: Diagnosis present

## 2020-04-18 DIAGNOSIS — Z743 Need for continuous supervision: Secondary | ICD-10-CM | POA: Diagnosis not present

## 2020-04-18 DIAGNOSIS — I4901 Ventricular fibrillation: Secondary | ICD-10-CM | POA: Diagnosis not present

## 2020-04-18 DIAGNOSIS — E1129 Type 2 diabetes mellitus with other diabetic kidney complication: Secondary | ICD-10-CM | POA: Diagnosis not present

## 2020-04-18 DIAGNOSIS — I50812 Chronic right heart failure: Secondary | ICD-10-CM | POA: Diagnosis not present

## 2020-04-18 DIAGNOSIS — N184 Chronic kidney disease, stage 4 (severe): Secondary | ICD-10-CM

## 2020-04-18 DIAGNOSIS — G92 Toxic encephalopathy: Secondary | ICD-10-CM | POA: Diagnosis not present

## 2020-04-18 DIAGNOSIS — I4891 Unspecified atrial fibrillation: Secondary | ICD-10-CM | POA: Diagnosis not present

## 2020-04-18 DIAGNOSIS — J90 Pleural effusion, not elsewhere classified: Secondary | ICD-10-CM | POA: Diagnosis not present

## 2020-04-18 DIAGNOSIS — R601 Generalized edema: Secondary | ICD-10-CM | POA: Diagnosis not present

## 2020-04-18 DIAGNOSIS — R579 Shock, unspecified: Secondary | ICD-10-CM | POA: Diagnosis not present

## 2020-04-18 DIAGNOSIS — D689 Coagulation defect, unspecified: Secondary | ICD-10-CM | POA: Diagnosis not present

## 2020-04-18 LAB — COMPREHENSIVE METABOLIC PANEL
ALT: 28 U/L (ref 0–44)
AST: 34 U/L (ref 15–41)
Albumin: 1.9 g/dL — ABNORMAL LOW (ref 3.5–5.0)
Alkaline Phosphatase: 184 U/L — ABNORMAL HIGH (ref 38–126)
Anion gap: 19 — ABNORMAL HIGH (ref 5–15)
BUN: 65 mg/dL — ABNORMAL HIGH (ref 6–20)
CO2: 15 mmol/L — ABNORMAL LOW (ref 22–32)
Calcium: 8 mg/dL — ABNORMAL LOW (ref 8.9–10.3)
Chloride: 103 mmol/L (ref 98–111)
Creatinine, Ser: 3.56 mg/dL — ABNORMAL HIGH (ref 0.44–1.00)
GFR calc Af Amer: 15 mL/min — ABNORMAL LOW (ref >60)
GFR calc non Af Amer: 13 mL/min — ABNORMAL LOW (ref >60)
Glucose, Bld: 191 mg/dL — ABNORMAL HIGH (ref 70–99)
Potassium: 4.5 mmol/L (ref 3.5–5.1)
Sodium: 137 mmol/L (ref 135–145)
Total Bilirubin: 7.4 mg/dL — ABNORMAL HIGH (ref 0.3–1.2)
Total Protein: 5.3 g/dL — ABNORMAL LOW (ref 6.5–8.1)

## 2020-04-18 LAB — POCT I-STAT 7, (LYTES, BLD GAS, ICA,H+H)
Acid-base deficit: 16 mmol/L — ABNORMAL HIGH (ref 0.0–2.0)
Acid-base deficit: 8 mmol/L — ABNORMAL HIGH (ref 0.0–2.0)
Bicarbonate: 14.7 mmol/L — ABNORMAL LOW (ref 20.0–28.0)
Bicarbonate: 18.3 mmol/L — ABNORMAL LOW (ref 20.0–28.0)
Calcium, Ion: 1.13 mmol/L — ABNORMAL LOW (ref 1.15–1.40)
Calcium, Ion: 1.08 mmol/L — ABNORMAL LOW (ref 1.15–1.40)
HCT: 40 % (ref 36.0–46.0)
HCT: 37 % (ref 36.0–46.0)
Hemoglobin: 13.6 g/dL (ref 12.0–15.0)
Hemoglobin: 12.6 g/dL (ref 12.0–15.0)
O2 Saturation: 79 %
O2 Saturation: 96 %
Patient temperature: 97.7
Patient temperature: 96.4
Potassium: 4.4 mmol/L (ref 3.5–5.1)
Potassium: 4 mmol/L (ref 3.5–5.1)
Sodium: 136 mmol/L (ref 135–145)
Sodium: 136 mmol/L (ref 135–145)
TCO2: 16 mmol/L — ABNORMAL LOW (ref 22–32)
TCO2: 19 mmol/L — ABNORMAL LOW (ref 22–32)
pCO2 arterial: 52.2 mmHg — ABNORMAL HIGH (ref 32.0–48.0)
pCO2 arterial: 38 mmHg (ref 32.0–48.0)
pH, Arterial: 7.054 — CL (ref 7.350–7.450)
pH, Arterial: 7.284 — ABNORMAL LOW (ref 7.350–7.450)
pO2, Arterial: 60 mmHg — ABNORMAL LOW (ref 83.0–108.0)
pO2, Arterial: 84 mmHg (ref 83.0–108.0)

## 2020-04-18 LAB — CBC WITH DIFFERENTIAL/PLATELET
Abs Immature Granulocytes: 0.77 10*3/uL — ABNORMAL HIGH (ref 0.00–0.07)
Basophils Absolute: 0.1 10*3/uL (ref 0.0–0.1)
Basophils Relative: 0 %
Eosinophils Absolute: 0 10*3/uL (ref 0.0–0.5)
Eosinophils Relative: 0 %
HCT: 42.4 % (ref 36.0–46.0)
Hemoglobin: 11.6 g/dL — ABNORMAL LOW (ref 12.0–15.0)
Immature Granulocytes: 4 %
Lymphocytes Relative: 17 %
Lymphs Abs: 3 10*3/uL (ref 0.7–4.0)
MCH: 31.8 pg (ref 26.0–34.0)
MCHC: 27.4 g/dL — ABNORMAL LOW (ref 30.0–36.0)
MCV: 116.2 fL — ABNORMAL HIGH (ref 80.0–100.0)
Monocytes Absolute: 0.8 10*3/uL (ref 0.1–1.0)
Monocytes Relative: 4 %
Neutro Abs: 13.5 10*3/uL — ABNORMAL HIGH (ref 1.7–7.7)
Neutrophils Relative %: 75 %
Platelets: 74 10*3/uL — ABNORMAL LOW (ref 150–400)
RBC: 3.65 MIL/uL — ABNORMAL LOW (ref 3.87–5.11)
WBC: 18.1 10*3/uL — ABNORMAL HIGH (ref 4.0–10.5)
nRBC: 2.7 % — ABNORMAL HIGH (ref 0.0–0.2)

## 2020-04-18 LAB — URINALYSIS, ROUTINE W REFLEX MICROSCOPIC
Glucose, UA: 50 mg/dL — AB
Ketones, ur: NEGATIVE mg/dL
Leukocytes,Ua: NEGATIVE
Nitrite: NEGATIVE
Protein, ur: >=300 mg/dL — AB
RBC / HPF: >50 RBC/hpf — ABNORMAL HIGH (ref 0–5)
Specific Gravity, Urine: 1.02 (ref 1.005–1.030)
WBC, UA: >50 WBC/hpf — ABNORMAL HIGH (ref 0–5)
pH: 5 (ref 5.0–8.0)

## 2020-04-18 LAB — RAPID URINE DRUG SCREEN, HOSP PERFORMED
Amphetamines: NOT DETECTED
Barbiturates: NOT DETECTED
Benzodiazepines: NOT DETECTED
Cocaine: NOT DETECTED
Opiates: POSITIVE — AB
Tetrahydrocannabinol: NOT DETECTED

## 2020-04-18 LAB — TYPE AND SCREEN
ABO/RH(D): O POS
Antibody Screen: NEGATIVE

## 2020-04-18 LAB — MRSA PCR SCREENING: MRSA by PCR: NEGATIVE

## 2020-04-18 LAB — I-STAT CHEM 8, ED
BUN: 64 mg/dL — ABNORMAL HIGH (ref 6–20)
Calcium, Ion: 1.02 mmol/L — ABNORMAL LOW (ref 1.15–1.40)
Chloride: 105 mmol/L (ref 98–111)
Creatinine, Ser: 3.5 mg/dL — ABNORMAL HIGH (ref 0.44–1.00)
Glucose, Bld: 120 mg/dL — ABNORMAL HIGH (ref 70–99)
HCT: 41 % (ref 36.0–46.0)
Hemoglobin: 13.9 g/dL (ref 12.0–15.0)
Potassium: 4.5 mmol/L (ref 3.5–5.1)
Sodium: 135 mmol/L (ref 135–145)
TCO2: 15 mmol/L — ABNORMAL LOW (ref 22–32)

## 2020-04-18 LAB — APTT: aPTT: 34 seconds (ref 24–36)

## 2020-04-18 LAB — LACTIC ACID, PLASMA
Lactic Acid, Venous: 11 mmol/L (ref 0.5–1.9)
Lactic Acid, Venous: 7.6 mmol/L (ref 0.5–1.9)
Lactic Acid, Venous: 8.7 mmol/L (ref 0.5–1.9)

## 2020-04-18 LAB — CBG MONITORING, ED
Glucose-Capillary: 122 mg/dL — ABNORMAL HIGH (ref 70–99)
Glucose-Capillary: 204 mg/dL — ABNORMAL HIGH (ref 70–99)

## 2020-04-18 LAB — PROTIME-INR
INR: 3.6 — ABNORMAL HIGH (ref 0.8–1.2)
INR: 3.4 — ABNORMAL HIGH (ref 0.8–1.2)
INR: 2.9 — ABNORMAL HIGH (ref 0.8–1.2)
Prothrombin Time: 35.7 seconds — ABNORMAL HIGH (ref 11.4–15.2)
Prothrombin Time: 34.1 seconds — ABNORMAL HIGH (ref 11.4–15.2)
Prothrombin Time: 30.5 seconds — ABNORMAL HIGH (ref 11.4–15.2)

## 2020-04-18 LAB — TROPONIN I (HIGH SENSITIVITY): Troponin I (High Sensitivity): 57 ng/L — ABNORMAL HIGH (ref <18)

## 2020-04-18 LAB — ABO/RH: ABO/RH(D): O POS

## 2020-04-18 LAB — I-STAT BETA HCG BLOOD, ED (MC, WL, AP ONLY): I-stat hCG, quantitative: <5 m[IU]/mL (ref <5)

## 2020-04-18 LAB — RESPIRATORY PANEL BY RT PCR (FLU A&B, COVID)
Influenza A by PCR: NEGATIVE
Influenza B by PCR: NEGATIVE
SARS Coronavirus 2 by RT PCR: NEGATIVE

## 2020-04-18 LAB — BRAIN NATRIURETIC PEPTIDE: B Natriuretic Peptide: 933.6 pg/mL — ABNORMAL HIGH (ref 0.0–100.0)

## 2020-04-18 LAB — GLUCOSE, CAPILLARY
Glucose-Capillary: 183 mg/dL — ABNORMAL HIGH (ref 70–99)
Glucose-Capillary: 198 mg/dL — ABNORMAL HIGH (ref 70–99)

## 2020-04-18 MED ORDER — SODIUM CHLORIDE 0.9 % IV SOLN
250.0000 mL | INTRAVENOUS | Status: DC
  Administered 2020-04-21 – 2020-04-22 (×2): 250 mL via INTRAVENOUS

## 2020-04-18 MED ORDER — POLYETHYLENE GLYCOL 3350 17 G PO PACK: 17.0000 g | PACK | Freq: Every day | ORAL | Status: DC | PRN

## 2020-04-18 MED ORDER — METHYLPREDNISOLONE SODIUM SUCC 125 MG IJ SOLR
125.0000 mg | Freq: Once | INTRAMUSCULAR | Status: AC
  Administered 2020-04-18: 12:00:00 125 mg via INTRAVENOUS
  Filled 2020-04-18: qty 2

## 2020-04-18 MED ORDER — ALBUTEROL SULFATE (2.5 MG/3ML) 0.083% IN NEBU
5.0000 mg | INHALATION_SOLUTION | Freq: Once | RESPIRATORY_TRACT | Status: AC
  Administered 2020-04-18: 5 mg via RESPIRATORY_TRACT
  Filled 2020-04-18: qty 6

## 2020-04-18 MED ORDER — SODIUM CHLORIDE 0.9 % IV BOLUS
1000.0000 mL | Freq: Once | INTRAVENOUS | Status: AC
  Administered 2020-04-18: 16:00:00 1000 mL via INTRAVENOUS

## 2020-04-18 MED ORDER — SODIUM CHLORIDE 0.9 % IV SOLN: INTRAVENOUS | Status: DC

## 2020-04-18 MED ORDER — SODIUM CHLORIDE 0.9 % IV SOLN
2.0000 g | Freq: Once | INTRAVENOUS | Status: AC
  Administered 2020-04-18: 12:00:00 2 g via INTRAVENOUS
  Filled 2020-04-18: qty 2

## 2020-04-18 MED ORDER — IPRATROPIUM BROMIDE 0.02 % IN SOLN
0.5000 mg | Freq: Once | RESPIRATORY_TRACT | Status: AC
  Administered 2020-04-18: 0.5 mg via RESPIRATORY_TRACT
  Filled 2020-04-18: qty 2.5

## 2020-04-18 MED ORDER — SODIUM BICARBONATE 8.4 % IV SOLN
50.0000 meq | Freq: Once | INTRAVENOUS | Status: AC
  Administered 2020-04-18: 50 meq via INTRAVENOUS

## 2020-04-18 MED ORDER — IPRATROPIUM-ALBUTEROL 0.5-2.5 (3) MG/3ML IN SOLN
3.0000 mL | Freq: Four times a day (QID) | RESPIRATORY_TRACT | Status: DC
  Administered 2020-04-18 – 2020-04-19 (×2): 3 mL via RESPIRATORY_TRACT
  Filled 2020-04-18 (×2): qty 3

## 2020-04-18 MED ORDER — PANTOPRAZOLE SODIUM 40 MG IV SOLR
40.0000 mg | Freq: Every day | INTRAVENOUS | Status: DC
  Administered 2020-04-18 – 2020-04-23 (×6): 40 mg via INTRAVENOUS
  Filled 2020-04-18 (×6): qty 40

## 2020-04-18 MED ORDER — IPRATROPIUM BROMIDE 0.02 % IN SOLN
0.5000 mg | Freq: Once | RESPIRATORY_TRACT | Status: AC
  Administered 2020-04-18: 13:00:00 0.5 mg via RESPIRATORY_TRACT
  Filled 2020-04-18: qty 2.5

## 2020-04-18 MED ORDER — VANCOMYCIN HCL 1500 MG/300ML IV SOLN: 1500.0000 mg | INTRAVENOUS | Status: DC

## 2020-04-18 MED ORDER — ORAL CARE MOUTH RINSE
15.0000 mL | OROMUCOSAL | Status: DC
  Administered 2020-04-19 – 2020-04-24 (×53): 15 mL via OROMUCOSAL

## 2020-04-18 MED ORDER — SODIUM CHLORIDE 0.9 % IV SOLN: 2.0000 g | INTRAVENOUS | Status: DC

## 2020-04-18 MED ORDER — NOREPINEPHRINE 4 MG/250ML-% IV SOLN
2.0000 ug/min | INTRAVENOUS | Status: DC
  Administered 2020-04-18: 3 ug/min via INTRAVENOUS
  Administered 2020-04-19: 8 ug/min via INTRAVENOUS
  Administered 2020-04-20: 10 ug/min via INTRAVENOUS
  Administered 2020-04-21: 5.333 ug/min via INTRAVENOUS
  Filled 2020-04-18 (×7): qty 250

## 2020-04-18 MED ORDER — DOCUSATE SODIUM 100 MG PO CAPS: 100.0000 mg | ORAL_CAPSULE | Freq: Two times a day (BID) | ORAL | Status: DC | PRN

## 2020-04-18 MED ORDER — NALOXONE HCL 2 MG/2ML IJ SOSY
1.0000 mg | PREFILLED_SYRINGE | Freq: Once | INTRAMUSCULAR | Status: AC
  Administered 2020-04-18: 12:00:00 1 mg via INTRAVENOUS
  Filled 2020-04-18: qty 2

## 2020-04-18 MED ORDER — CHLORHEXIDINE GLUCONATE CLOTH 2 % EX PADS
6.0000 | MEDICATED_PAD | Freq: Every day | CUTANEOUS | Status: DC
  Administered 2020-04-20 – 2020-04-24 (×6): 6 via TOPICAL

## 2020-04-18 MED ORDER — NOREPINEPHRINE 4 MG/250ML-% IV SOLN
0.0000 ug/min | INTRAVENOUS | Status: DC
  Filled 2020-04-18: qty 250

## 2020-04-18 MED ORDER — VANCOMYCIN HCL IN DEXTROSE 1-5 GM/200ML-% IV SOLN
1000.0000 mg | Freq: Once | INTRAVENOUS | Status: AC
  Administered 2020-04-18: 12:00:00 1000 mg via INTRAVENOUS
  Filled 2020-04-18: qty 200

## 2020-04-18 MED ORDER — ASPIRIN 300 MG RE SUPP
300.0000 mg | RECTAL | Status: AC
  Administered 2020-04-18: 15:00:00 300 mg via RECTAL
  Filled 2020-04-18: qty 1

## 2020-04-18 MED ORDER — VANCOMYCIN HCL IN DEXTROSE 1-5 GM/200ML-% IV SOLN
1000.0000 mg | Freq: Once | INTRAVENOUS | Status: DC
  Administered 2020-04-18: 13:00:00 1000 mg via INTRAVENOUS
  Filled 2020-04-18: qty 200

## 2020-04-18 MED ORDER — INSULIN ASPART 100 UNIT/ML ~~LOC~~ SOLN
2.0000 [IU] | SUBCUTANEOUS | Status: DC
  Administered 2020-04-18: 16:00:00 6 [IU] via SUBCUTANEOUS
  Administered 2020-04-18 – 2020-04-19 (×4): 4 [IU] via SUBCUTANEOUS

## 2020-04-18 MED ORDER — SODIUM BICARBONATE-DEXTROSE 150-5 MEQ/L-% IV SOLN
150.0000 meq | INTRAVENOUS | Status: DC
  Administered 2020-04-18 – 2020-04-19 (×3): 150 meq via INTRAVENOUS
  Filled 2020-04-18 (×4): qty 1000

## 2020-04-18 MED ORDER — SODIUM BICARBONATE-DEXTROSE 150-5 MEQ/L-% IV SOLN
150.0000 meq | INTRAVENOUS | Status: DC
  Administered 2020-04-18: 150 meq via INTRAVENOUS
  Filled 2020-04-18: qty 1000

## 2020-04-18 MED ORDER — ETOMIDATE 2 MG/ML IV SOLN
INTRAVENOUS | Status: AC | PRN
  Administered 2020-04-18: 100 mg via INTRAVENOUS

## 2020-04-18 MED ORDER — ALBUTEROL (5 MG/ML) CONTINUOUS INHALATION SOLN
10.0000 mg/h | INHALATION_SOLUTION | Freq: Once | RESPIRATORY_TRACT | Status: AC
  Administered 2020-04-18: 13:00:00 10 mg/h via RESPIRATORY_TRACT
  Filled 2020-04-18: qty 20

## 2020-04-18 MED ORDER — SODIUM CHLORIDE 0.9 % IV BOLUS
1000.0000 mL | Freq: Once | INTRAVENOUS | Status: AC
  Administered 2020-04-18: 1000 mL via INTRAVENOUS

## 2020-04-18 MED ORDER — FUROSEMIDE 10 MG/ML IJ SOLN
120.0000 mg | Freq: Once | INTRAVENOUS | Status: AC
  Administered 2020-04-18: 16:00:00 120 mg via INTRAVENOUS
  Filled 2020-04-18: qty 2

## 2020-04-18 MED ORDER — SUCCINYLCHOLINE CHLORIDE 20 MG/ML IJ SOLN
INTRAMUSCULAR | Status: AC | PRN
  Administered 2020-04-18: 100 mg via INTRAVENOUS

## 2020-04-18 MED ORDER — FENTANYL CITRATE (PF) 100 MCG/2ML IJ SOLN
INTRAMUSCULAR | Status: AC
  Administered 2020-04-18: 14:00:00 100 ug
  Filled 2020-04-18: qty 2

## 2020-04-18 MED ORDER — FENTANYL 2500MCG IN NS 250ML (10MCG/ML) PREMIX INFUSION
0.0000 ug/h | INTRAVENOUS | Status: DC
  Administered 2020-04-18: 25 ug/h via INTRAVENOUS
  Administered 2020-04-19: 350 ug/h via INTRAVENOUS
  Administered 2020-04-19: 200 ug/h via INTRAVENOUS
  Administered 2020-04-19: 400 ug/h via INTRAVENOUS
  Filled 2020-04-18 (×5): qty 250

## 2020-04-18 MED ORDER — CHLORHEXIDINE GLUCONATE 0.12% ORAL RINSE (MEDLINE KIT)
15.0000 mL | Freq: Two times a day (BID) | OROMUCOSAL | Status: DC
  Administered 2020-04-18 – 2020-04-24 (×12): 15 mL via OROMUCOSAL

## 2020-04-18 NOTE — Progress Notes (Signed)
vLTM started  Neurology notified.  Event button tested  [facility] monitoring pt  Same leads used for routine and ltm

## 2020-04-18 NOTE — Progress Notes (Signed)
Responded to ED page to support patient and staff.  Patient came to ED after a witnessed CPR arrest.  Staff still working with patient.  I spoke with patient's daughter [name] who witnessed the arrest and she in on her way to hospital. from [place], [place]. Provided emotional and spiritual support to family and patient. Supported Biochemist, clinical as needed.  Chaplain will follow as needed.  Jaclynn Major, [place], [facility], Pager [phone]

## 2020-04-18 NOTE — H&P (Signed)
See Consultation note dated 04/13/2020

## 2020-04-18 NOTE — Progress Notes (Signed)
Accompanied doctor to speak with patient's daughter. Escorted daughter to bedside to visit with mother. Chaplain available as needed.  Jaclynn Major, [place], [facility],  Pager [phone]

## 2020-04-18 NOTE — ED Notes (Signed)
Ice packs applied at this time. 2H RN to come apply arctic sun.

## 2020-04-18 NOTE — Telephone Encounter (Signed)
pt daughter called this am and said that the pt stopped breathing this am and is now at [facility] on the vent. I will cancel her current appts.

## 2020-04-18 NOTE — ED Triage Notes (Signed)
Pt bib ems after witnessed cardiac arrest. cpr initiated at [place], [place] at 0956. Given 5 epi and 1 amp bicarb by ems. 1L NS given en route. Pt went into vfib after last epi and bicarb amp, defibrillated by ems. Pt did have bloody stool onset yesterday. IO to L humerus placed by ems. cbg 298, BO 128/64, capnog 31-34, 91% king airway, HR 120 afib. EDP at bedside on pt arrival.

## 2020-04-18 NOTE — Consult Note (Signed)
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 Recently admited for A/C heart failue in Feb and March of 2021 due to fluid and dietary indiscretion.   Slumped to chair at home, bystander CPR for several minutes followed by EMS ACLS for about 5 min. EPI x5, BICARB x1, Defib x1. Suspected to be respiratory driven with COPD and suspected OSA, worsening RHF.  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 Recently admited for A/C heart failue in Feb and March of 2021 due to fluid and dietary indiscretion.   Not fully recovered physically after recent admission. Reported SOB. Hemoptysis yesterday x1 and x2-3 today. Was about to go see her orthopedic doctor when she slumped to chair at home, move to floor and 911 was called received bystander CPR for several minutes; pulse represent so this was stopped for about 1 minute during which EMS arrive and found her pulseless and apneic. ACLS for about 5 min. EPI x5, BICARB x1, Defib x1. King airway in the field, intubated in ED. Suspected to be respiratory driven with COPD and suspected OSA, worsening RHF.   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  Objective   Blood pressure 114/80, pulse (!) 104, temperature (!) 97.4 F (36.3 C), temperature source Temporal, resp. rate (!) 26, height 5\' 5"  (1.651 m), weight 110 kg, SpO2 93 %.    Vent Mode: PRVC FiO2 (%):  [100 %] 100 % Set Rate:  [20 bmp] 20 bmp Vt Set:  [450 mL] 450 mL PEEP:  [14 cmH20] 14 cmH20 Plateau Pressure:  [25 cmH20] 25 cmH20   Intake/Output Summary (Last 24 hours) at 04/13/2020 1236 Last data filed at 04/20/2020 1229 Gross per 24 hour  Intake -  Output 25 ml  Net -25 ml   Filed Weights   04/28/2020 1039  Weight: 110 kg    Examination: General: Intubated obese female, unresponsive HENT: Scleral icterus, ETT/OG in place Lungs: CTAB Cardiovascular: RRR no RMG Abdomen: Obese, BS+ Extremities: Edematous, Wrapped bilaterally Neuro: Unresponsive, not on sedation  Resolved Hospital Problem list     Assessment & Plan:   ?OSA: Sleep study planned COPD Severe RHF: Echo 2021 A/C HFpEF: EF 55-60% (2021) Acute Hypoxic Respiratory Failure / Chronic Respiratory Failure S/P Cardiac Arrest: Eased to ground. Bystander CPR for several minutes per daughter. Apneic and pulseless on EMS arrival.  EPI x5, BICARB x1, Defib x1, By EMS, [place] in "several minutes." EMS report V-fib rhythm per EDP note. Suspect respiratory etiology due to COPD and likely OSA in  the setting of HFpEF with Volume over load and known Severe right heart failure. Inital LA 11. Initial ABG 7.054, 52, 60. - Cardiology Consulted - TTM 36 degrees - Mechanical Ventilation - VAP Protocol - CXR with Pulm edema, less likely PNA (follow up cultures) - Fentanyl gtt, Will monitor for sedation needs - Lasix 120mg , assess response - Bicarb gtt - Echocardiogram - Follow LA, Trop, CMP, CBC, ABG  ?CAD history: Previously reportred, but no record of this. Low risk NST in the past. Paroxsymal  A-Fib: On Diltiazem and Eliquis at home. - Holding home Eliquis and Diltiazem - INR 3.6, follow repeat  A/CKD III: Cr 3.5 ([place] 2) - In the setting of cardiac arrest  - Monitor UOP and Renal function - Replete e-lytes as need - Avoid nephrotoxic agents  Scleral Icterus, Recent LFT elevations (40s-60s) - Follow INR, CMP - Possible Hepatic Congestion  DM: - SSI  Recurrent VTE: On Eliquis. Holding given change in renal function and INR 3.6 Hx of CVA: Holding home Statin  Best practice:  Diet: NPO Pain/Anxiety/Delirium protocol (if indicated): Fentanyl gtt VAP protocol (if indicated): Yes DVT prophylaxis: Heparin GI prophylaxis: PPI Glucose control: SSI Mobility: BR Code Status: Full Family Communication: Spoke with daughter at bedside in ED Disposition: ICU  Labs   CBC: Recent Labs  Lab 04/28/2020 1058 04/12/2020 1138  HGB 13.6 13.9  HCT 40.0 44.0    Basic Metabolic Panel: Recent Labs  Lab 04/25/2020 1058 04/27/2020 1138  NA 136 135  K 4.4 4.5  CL  --  105  GLUCOSE  --  120*  BUN  --  64*  CREATININE  --  3.50*   GFR: Estimated Creatinine Clearance: 21.4 mL/min (A) (by C-G formula based on SCr of 3.5 mg/dL (H)). Recent Labs  Lab 04/07/2020 1044  LATICACIDVEN 11.0*    Liver Function Tests: No results for input(s): AST, ALT, ALKPHOS, BILITOT, PROT, ALBUMIN in the last 168 hours. No results for input(s): LIPASE, AMYLASE in the last 168 hours. No results for input(s): AMMONIA in the last 168 hours.  ABG    Component Value Date/Time   PHART 7.054 (LL) 04/23/2020 1058   PCO2ART 52.2 (H) 04/17/2020 1058   PO2ART 60.0 (L) 04/23/2020 1058   HCO3 14.7 (L) 04/08/2020 1058   TCO2 15 (L) 04/08/2020 1138   ACIDBASEDEF 16.0 (H) 04/03/2020 1058   O2SAT 79.0 04/16/2020 1058     Coagulation Profile: Recent Labs  Lab 04/14/2020 1044  INR 3.6*    Cardiac Enzymes: No results for input(s): CKTOTAL, CKMB, CKMBINDEX, TROPONINI in the last 168 hours.  HbA1C: Hgb  A1c MFr Bld  Date/Time Value Ref Range Status  02/15/2020 01:12 PM 7.5 (H) 4.8 - 5.6 % Final    Comment:    (NOTE) Pre diabetes:          5.7%-6.4% Diabetes:              >6.4% Glycemic control for   <7.0% adults with diabetes   07/04/2012 05:23 AM 6.4 (H) <5.7 % Final    Comment:    (NOTE)  According to the ADA Clinical Practice Recommendations for 2011, when HbA1c is used as a screening test:  >=6.5%   Diagnostic of Diabetes Mellitus           (if abnormal result is confirmed) 5.7-6.4%   Increased risk of developing Diabetes Mellitus References:Diagnosis and Classification of Diabetes Mellitus,Diabetes AOZH,0865,78(IONGE 1):S62-S69 and Standards of Medical Care in         Diabetes - 2011,Diabetes XBMW,4132,44 (Suppl 1):S11-S61.    CBG: Recent Labs  Lab 04/20/2020 1038  GLUCAP 122*    Review of Systems:   Unable to assess as patient intubated and unresponsive.  Past Medical History  She,  has a past medical history of Acute ischemic stroke ([place]) (07/03/2012), Crack cocaine use, Diabetes ([place]), Diabetes mellitus ([place]), DVT (deep venous thrombosis) ([place]), Hemiparesis, acute ([place]) (07/04/2012), Hypertension, LVH (left ventricular hypertrophy) (07/04/2012), Myocardial infarct, old, Thrombocytopenia ([place]) (07/03/2012), and Tobacco abuse.   Surgical History    Past Surgical History:  Procedure Laterality Date  . ABDOMINAL HYSTERECTOMY    . HERNIA REPAIR       Social History   reports that she has been smoking cigarettes. She has been smoking about 1.00 pack per day. She has never used smokeless tobacco. She reports that she does not drink alcohol or use drugs.   Family History   Her family history includes Diabetes in her brother and mother; Heart disease in her father and mother; Hypertension in her father and mother; Stroke in her mother.   Allergies No Known Allergies   Home Medications  Prior to Admission  medications   Medication Sig Start Date End Date Taking? Authorizing Provider  acetaminophen (TYLENOL) 325 MG tablet Take 2 tablets (650 mg total) by mouth every 6 (six) hours as needed for mild pain (or Fever >/= 101). 02/21/20   Emokpae, Courage, MD  albuterol (PROVENTIL) (2.5 MG/3ML) 0.083% nebulizer solution Take 3 mLs (2.5 mg total) by nebulization every 4 (four) hours as needed for wheezing or shortness of breath. 02/21/20   Denton Brick, Courage, MD  albuterol (VENTOLIN HFA) 108 (90 Base) MCG/ACT inhaler Inhale 2 puffs into the lungs every 4 (four) hours as needed for wheezing or shortness of breath. 02/21/20   Denton Brick, Courage, MD  apixaban (ELIQUIS) 5 MG TABS tablet Take 1 tablet (5 mg total) by mouth 2 (two) times daily. 04/14/20 05/14/20  Herminio Commons, MD  atorvastatin (LIPITOR) 10 MG tablet Take 1 tablet (10 mg total) by mouth every evening. 04/14/20   Herminio Commons, MD  budesonide-formoterol (SYMBICORT) 160-4.5 MCG/ACT inhaler Inhale 2 puffs into the lungs 2 (two) times daily. 02/21/20   Roxan Hockey, MD  cephALEXin (KEFLEX) 250 MG capsule Take 250 mg by mouth 3 (three) times daily. 03/25/20   [provider]  diltiazem (CARDIZEM CD) 120 MG 24 hr capsule Take 1 capsule (120 mg total) by mouth daily. 04/14/20 05/14/20  Herminio Commons, MD  furosemide (LASIX) 40 MG tablet Take 1.5 tablets (60 mg total) by mouth daily. 04/14/20   Herminio Commons, MD  HYDROcodone-acetaminophen ([place]) 7.5-325 MG tablet One tablet as needed every six hours for pain. 04/06/20   Sanjuana Kava, MD  linagliptin (TRADJENTA) 5 MG TABS tablet Take 1 tablet (5 mg total) by mouth daily. 03/20/20   Johnson, Clanford L, MD  mirtazapine (REMERON) 7.5 MG tablet Take 7.5 mg by mouth at bedtime. 03/24/20   [provider]     Critical care time:    Pearson Grippe, DO IM PGY-3

## 2020-04-18 NOTE — ED Provider Notes (Addendum)
[facility] EMERGENCY DEPARTMENT Provider Note   CSN: [id number] Arrival date & time: 04/23/2020  1031     History Chief Complaint  Patient presents with  . Cardiac Arrest    Tracy [name] is a 59 y.o. female.  Patienti with hx copd, chf, presents from home s/p cardiorespiratory arrest. Per EMS report, pt generally not feeling well in past couple days, and was going to go to doctor today. Family noted patient become unresponsive this AM. Unclear if bystander cpr or duration of downtime. EMS noted patient initially apneic and pulseless. EMS started cpr. Initial cbg 298. EMS gave epi x 5, hco3 x 1, cpr, placed King airway, and defib x 1. After several minutes CPR pt did have return of pulses, and EMS noted began breathing approximately 4 times per minute. Level 5 caveat, pt unresponsive, King airway. EMS indicates family said no trauma or fall, eased pt to ground. No report of fevers.   The history is provided by the patient and the EMS personnel. The history is limited by the condition of the patient.  Cardiac Arrest      Past Medical History:  Diagnosis Date  . Acute ischemic stroke ([place]) 07/03/2012  . Crack cocaine use   . Diabetes ([place])   . Diabetes mellitus ([place])   . DVT (deep venous thrombosis) ([place])   . Hemiparesis, acute ([place]) 07/04/2012  . Hypertension   . LVH (left ventricular hypertrophy) 07/04/2012   Ejection fraction 60%.  . Myocardial infarct, old   . Thrombocytopenia ([place]) 07/03/2012  . Tobacco abuse     Patient Active Problem List   Diagnosis Date Noted  . Acute metabolic encephalopathy 08/12/4817  . Acute on chronic respiratory failure with hypoxia ([place]) 03/13/2020  . Sepsis due to Escherichia coli (E. coli) ([place]) 03/09/2020  . Acute cystitis with hematuria   . Elevated LFTs   . CKD (chronic kidney disease) stage 4, GFR 15-29 ml/min (H[facility]) 03/08/2020  . Anasarca   . Poor intravenous access   . Acute on chronic diastolic CHF (congestive  heart failure) ([place]) 03/07/2020  . Acute on chronic right-sided heart failure ([place]) 03/07/2020  . Chronic respiratory failure with hypoxia ([place]) 03/07/2020  . Acute lower UTI 03/07/2020  . Paroxysmal A-fib ([place]) 02/16/2020  . Acute exacerbation of CHF (congestive heart failure) ([place]) 02/15/2020  . Acute respiratory failure with hypoxia ([place]) 02/15/2020  . Acute on chronic heart failure with preserved ejection fraction (HFpEF) ([place]) 02/15/2020  . H/O: CVA (cerebrovascular accident)/ 2013 02/15/2020  . Diabetes mellitus type 2, uncontrolled ([place]) 02/15/2020  . Rt DVT (deep venous thrombosis) -2014 02/15/2020  . Anticoagulated on Coumadin 02/15/2020  . Right bundle branch block 09/05/2017  . Long term current use of anticoagulant therapy 03/06/2017  . Body mass index (BMI) of 40.0-44.9 in adult ([place]) 02/20/2017  . Personal history of venous thrombosis and embolism 02/20/2017  . Umbilical hernia 56/31/4970  . Phlebitis and thrombophlebitis of lower extremities 01/13/2017  . Left knee pain 03/05/2016  . Influenza with respiratory manifestations 03/22/2015  . COPD exacerbation ([place]) 03/21/2015  . CAP (community acquired pneumonia) 07/04/2013  . Sepsis ([place]) 07/04/2013  . Acute renal failure ([place]) 07/04/2013  . Left thyroid nodule 07/04/2013  . Essential hypertension 07/04/2013  . Diabetes ([place]) 07/04/2013  . Acute respiratory failure ([place]) 07/04/2013  . Hemiparesis, acute ([place]) 07/04/2012  . LVH (left ventricular hypertrophy) 07/04/2012  . Acute ischemic stroke ([place]) 07/03/2012  . Hypertensive urgency 07/03/2012  . Tobacco abuse 07/03/2012  .  Noncompliance 07/03/2012  . Thrombocytopenia ([place]) 07/03/2012  . Crack cocaine use 07/03/2012    Past Surgical History:  Procedure Laterality Date  . ABDOMINAL HYSTERECTOMY    . HERNIA REPAIR       OB History    Gravida  2   Para  2   Term  2   Preterm      AB      Living        SAB      TAB      Ectopic      Multiple       Live Births              Family History  Problem Relation Age of Onset  . Heart disease Mother   . Diabetes Mother   . Stroke Mother   . Hypertension Mother   . Hypertension Father   . Heart disease Father   . Diabetes Brother     Social History   Tobacco Use  . Smoking status: Current Every Day Smoker    Packs/day: 1.00    Types: Cigarettes  . Smokeless tobacco: Never Used  Substance Use Topics  . Alcohol use: No  . Drug use: No    Comment: last used 2 years ago    Home Medications Prior to Admission medications   Medication Sig Start Date End Date Taking? Authorizing Provider  acetaminophen (TYLENOL) 325 MG tablet Take 2 tablets (650 mg total) by mouth every 6 (six) hours as needed for mild pain (or Fever >/= 101). 02/21/20   Emokpae, Courage, MD  albuterol (PROVENTIL) (2.5 MG/3ML) 0.083% nebulizer solution Take 3 mLs (2.5 mg total) by nebulization every 4 (four) hours as needed for wheezing or shortness of breath. 02/21/20   [place] Brick, Courage, MD  albuterol (VENTOLIN HFA) 108 (90 Base) MCG/ACT inhaler Inhale 2 puffs into the lungs every 4 (four) hours as needed for wheezing or shortness of breath. 02/21/20   [place] Brick, Courage, MD  apixaban (ELIQUIS) 5 MG TABS tablet Take 1 tablet (5 mg total) by mouth 2 (two) times daily. 04/14/20 05/14/20  Herminio Commons, MD  atorvastatin (LIPITOR) 10 MG tablet Take 1 tablet (10 mg total) by mouth every evening. 04/14/20   Herminio Commons, MD  budesonide-formoterol (SYMBICORT) 160-4.5 MCG/ACT inhaler Inhale 2 puffs into the lungs 2 (two) times daily. 02/21/20   Roxan Hockey, MD  cephALEXin (KEFLEX) 250 MG capsule Take 250 mg by mouth 3 (three) times daily. 03/25/20   [provider]  diltiazem (CARDIZEM CD) 120 MG 24 hr capsule Take 1 capsule (120 mg total) by mouth daily. 04/14/20 05/14/20  Herminio Commons, MD  furosemide (LASIX) 40 MG tablet Take 1.5 tablets (60 mg total) by mouth daily. 04/14/20   Herminio Commons, MD  HYDROcodone-acetaminophen ([place]) 7.5-325 MG tablet One tablet as needed every six hours for pain. 04/06/20   Sanjuana Kava, MD  linagliptin (TRADJENTA) 5 MG TABS tablet Take 1 tablet (5 mg total) by mouth daily. 03/20/20   Johnson, Clanford L, MD  mirtazapine (REMERON) 7.5 MG tablet Take 7.5 mg by mouth at bedtime. 03/24/20   [provider]    Allergies    Patient has no known allergies.  Review of Systems   Review of Systems  Unable to perform ROS: Patient unresponsive  level 5 caveat, pt unresponsive.     Physical Exam Updated Vital Signs BP 91/67   Pulse 92   Resp 15   Ht 1.651  m (5\' 5" )   Wt 110 kg   SpO2 (!) 87%   BMI 40.36 kg/m   Physical Exam Vitals and nursing note reviewed.  Constitutional:      General: She is in acute distress.     Appearance: She is well-developed.     Comments: Unresponsive, bag ventilated.   HENT:     Head: Atraumatic.     Nose: Nose normal.     Mouth/Throat:     Mouth: Mucous membranes are moist.     Comments: King airway.  Eyes:     General: No scleral icterus.    Conjunctiva/sclera: Conjunctivae normal.     Comments: Pupils 3-4 mm, not reactive.   Neck:     Trachea: No tracheal deviation.     Comments: Trachea midline.  Cardiovascular:     Rate and Rhythm: Normal rate and regular rhythm.     Pulses: Normal pulses.     Heart sounds: Normal heart sounds. No murmur. No friction rub. No gallop.   Pulmonary:     Effort: Respiratory distress present.     Breath sounds: Wheezing present.     Comments: Bag ventilated via Edison Pace.  Abdominal:     Palpations: Abdomen is soft.     Comments: Obese. No mass felt.   Genitourinary:    Comments: No cva tenderness. Medium brown stool, heme neg. No melena.  Musculoskeletal:        General: Swelling present.     Cervical back: Normal range of motion and neck supple. No rigidity. No muscular tenderness.     Right lower leg: Edema present.     Left lower leg: Edema  present.     Comments: Diffuse edema. Bilateral lower extremity dressings intact, no soiling of dressings, no purulent drainage noted. Palp pulse bil, intact cap refill in toes.   Skin:    General: Skin is warm and dry.     Findings: No rash.  Neurological:     Comments: Unresponsive. No movement of extremities to stimuli. Occasional resp effort.   Psychiatric:     Comments: Unresponsive.      ED Results / Procedures / Treatments   Labs (all labs ordered are listed, but only abnormal results are displayed) Results for orders placed or performed during the hospital encounter of 04/26/2020  Respiratory Panel by RT PCR (Flu A&B, Covid) - Nasopharyngeal Swab   Specimen: Nasopharyngeal Swab  Result Value Ref Range   SARS Coronavirus 2 by RT PCR NEGATIVE NEGATIVE   Influenza A by PCR NEGATIVE NEGATIVE   Influenza B by PCR NEGATIVE NEGATIVE  Lactic acid, plasma  Result Value Ref Range   Lactic Acid, Venous 11.0 (HH) 0.5 - 1.9 mmol/L  CBC WITH DIFFERENTIAL  Result Value Ref Range   WBC 18.1 (H) 4.0 - 10.5 K/uL   RBC 3.65 (L) 3.87 - 5.11 MIL/uL   Hemoglobin 11.6 (L) 12.0 - 15.0 g/dL   HCT 42.4 36.0 - 46.0 %   MCV 116.2 (H) 80.0 - 100.0 fL   MCH 31.8 26.0 - 34.0 pg   MCHC 27.4 (L) 30.0 - 36.0 g/dL   RDW Not Measured 11.5 - 15.5 %   Platelets 74 (L) 150 - 400 K/uL   nRBC 2.7 (H) 0.0 - 0.2 %   Neutrophils Relative % 75 %   Neutro Abs 13.5 (H) 1.7 - 7.7 K/uL   Lymphocytes Relative 17 %   Lymphs Abs 3.0 0.7 - 4.0 K/uL   Monocytes Relative 4 %  Monocytes Absolute 0.8 0.1 - 1.0 K/uL   Eosinophils Relative 0 %   Eosinophils Absolute 0.0 0.0 - 0.5 K/uL   Basophils Relative 0 %   Basophils Absolute 0.1 0.0 - 0.1 K/uL   Immature Granulocytes 4 %   Abs Immature Granulocytes 0.77 (H) 0.00 - 0.07 K/uL   Polychromasia PRESENT   Protime-INR  Result Value Ref Range   Prothrombin Time 35.7 (H) 11.4 - 15.2 seconds   INR 3.6 (H) 0.8 - 1.2  Urinalysis, Routine w reflex microscopic  Result  Value Ref Range   Color, Urine AMBER (A) YELLOW   APPearance CLOUDY (A) CLEAR   Specific Gravity, Urine 1.020 1.005 - 1.030   pH 5.0 5.0 - 8.0   Glucose, UA 50 (A) NEGATIVE mg/dL   Hgb urine dipstick MODERATE (A) NEGATIVE   Bilirubin Urine MODERATE (A) NEGATIVE   Ketones, ur NEGATIVE NEGATIVE mg/dL   Protein, ur >=300 (A) NEGATIVE mg/dL   Nitrite NEGATIVE NEGATIVE   Leukocytes,Ua NEGATIVE NEGATIVE   RBC / HPF >50 (H) 0 - 5 RBC/hpf   WBC, UA >50 (H) 0 - 5 WBC/hpf   Bacteria, UA MANY (A) NONE SEEN   Squamous Epithelial / LPF 21-50 0 - 5   Mucus PRESENT    Budding Yeast PRESENT    Hyaline Casts, UA PRESENT    Amorphous Crystal PRESENT    Non Squamous Epithelial 11-20 (A) NONE SEEN  Brain natriuretic peptide  Result Value Ref Range   B Natriuretic Peptide 933.6 (H) 0.0 - 100.0 pg/mL  Rapid urine drug screen (hospital performed)  Result Value Ref Range   Opiates POSITIVE (A) NONE DETECTED   Cocaine NONE DETECTED NONE DETECTED   Benzodiazepines NONE DETECTED NONE DETECTED   Amphetamines NONE DETECTED NONE DETECTED   Tetrahydrocannabinol NONE DETECTED NONE DETECTED   Barbiturates NONE DETECTED NONE DETECTED  CBG monitoring, ED  Result Value Ref Range   Glucose-Capillary 122 (H) 70 - 99 mg/dL   Comment 1 Notify RN    Comment 2 Document in Chart   I-Stat beta hCG blood, ED  Result Value Ref Range   I-stat hCG, quantitative <5.0 <5 mIU/mL   Comment 3          I-Stat Chem 8, ED  Result Value Ref Range   Sodium 135 135 - 145 mmol/L   Potassium 4.5 3.5 - 5.1 mmol/L   Chloride 105 98 - 111 mmol/L   BUN 64 (H) 6 - 20 mg/dL   Creatinine, Ser 3.50 (H) 0.44 - 1.00 mg/dL   Glucose, Bld 120 (H) 70 - 99 mg/dL   Calcium, Ion 1.02 (L) 1.15 - 1.40 mmol/L   TCO2 15 (L) 22 - 32 mmol/L   Hemoglobin 13.9 12.0 - 15.0 g/dL   HCT 41.0 36.0 - 46.0 %  I-STAT 7, (LYTES, BLD GAS, ICA, H+H)  Result Value Ref Range   pH, Arterial 7.054 (LL) 7.350 - 7.450   pCO2 arterial 52.2 (H) 32.0 - 48.0 mmHg     pO2, Arterial 60.0 (L) 83.0 - 108.0 mmHg   Bicarbonate 14.7 (L) 20.0 - 28.0 mmol/L   TCO2 16 (L) 22 - 32 mmol/L   O2 Saturation 79.0 %   Acid-base deficit 16.0 (H) 0.0 - 2.0 mmol/L   Sodium 136 135 - 145 mmol/L   Potassium 4.4 3.5 - 5.1 mmol/L   Calcium, Ion 1.13 (L) 1.15 - 1.40 mmol/L   HCT 40.0 36.0 - 46.0 %   Hemoglobin 13.6 12.0 - 15.0  g/dL   Patient temperature 97.7 F    Sample type ARTERIAL    Comment NOTIFIED PHYSICIAN   Type and screen  Result Value Ref Range   ABO/RH(D) O POS    Antibody Screen NEG    Sample Expiration      04/21/2020,2359 Performed at [facility], [place] [address] [place], [place] [address]   ABO/Rh  Result Value Ref Range   ABO/RH(D)      O POS Performed at [facility] [address], [place], [place] [address]    DG Chest Port 1 View  Result Date: 04/01/2020 CLINICAL DATA:  Endotracheal tube placement.  Post CPR. EXAM: PORTABLE CHEST 1 VIEW COMPARISON:  Chest x-ray dated March 13, 2020. FINDINGS: Endotracheal tube tip in good position approximately 2.8 cm above the carina. Enteric tube tip just within the stomach with the proximal side port in the lower esophagus. Chronic cardiomegaly. Pulmonary vascular congestion. Hazy perihilar opacities with consolidation in the left upper and lower lobes. Scattered scarring in the right lung. No pneumothorax or large pleural effusion. No acute osseous abnormality. IMPRESSION: 1. Appropriately positioned endotracheal tube. 2. Enteric tube tip just within the stomach with the proximal side port in the lower esophagus. Recommend advancement. 3. Hazy perihilar opacities with consolidation in the left upper and lower lobes likely reflecting a degree of pulmonary edema with superimposed pneumonia in the left lung not excluded. Electronically Signed   By: Titus Dubin M.D.   On: 04/28/2020 11:26    EKG EKG Interpretation  Date/Time:  Tuesday April 18 2020 10:37:52 EDT Ventricular Rate:  161 PR  Interval:    QRS Duration: 133 QT Interval:  324 QTC Calculation: 521 R Axis:   90 Text Interpretation: Atrial fibrillation Right bundle branch block Nonspecific intraventricular conduction delay Non-specific ST-t changes Confirmed by Lajean Saver [phone]) on 04/16/2020 10:51:18 AM   Radiology CT HEAD WO CONTRAST  Result Date: 04/01/2020 CLINICAL DATA:  Altered mental status. EXAM: CT HEAD WITHOUT CONTRAST TECHNIQUE: Contiguous axial images were obtained from the base of the skull through the vertex without intravenous contrast. COMPARISON:  March 13, 2020. FINDINGS: Brain: Mild diffuse cortical atrophy is noted. Mild chronic ischemic white matter disease is noted. No mass effect or midline shift is noted. Ventricular size is within normal limits. There is no evidence of mass lesion, hemorrhage or acute infarction. Vascular: No hyperdense vessel or unexpected calcification. Skull: Normal. Negative for fracture or focal lesion. Sinuses/Orbits: No acute finding. Other: None. IMPRESSION: Mild diffuse cortical atrophy. Mild chronic ischemic white matter disease. No acute intracranial abnormality seen. Electronically Signed   By: Marijo Conception M.D.   On: 04/23/2020 14:46   CT Chest Wo Contrast  Result Date: 04/02/2020 CLINICAL DATA:  Hemoptysis. EXAM: CT CHEST WITHOUT CONTRAST TECHNIQUE: Multidetector CT imaging of the chest was performed following the standard protocol without IV contrast. COMPARISON:  03/21/2015. Portable chest obtained earlier today. FINDINGS: Cardiovascular: Stable enlarged heart. Small pericardial effusion with a maximum thickness of 1.2 cm, not previously present. Atheromatous calcifications, including the coronary arteries and aorta. Mediastinum/Nodes: Endotracheal tube tip at the top of the carina. Feeding tube extending into the stomach. Multiple bilateral thyroid nodules. The largest is on the right measuring 2.5 cm in maximum diameter. Enlarged precarinal node with a short axis  diameter of 21 mm, previously 19 mm. No other enlarged lymph nodes are seen. Lungs/Pleura: Small bilateral pleural effusions. Dense patchy opacity and consolidation in the posterior half of the  left lung involving the left lower lobe and left upper lobe. Milder patchy opacity in the right lung, primarily in the dependent right lower lobe, with underlying cylindrical bronchiectasis. Mild patchy ground-glass opacity in both lungs. Upper Abdomen: Small amount of free peritoneal fluid. Musculoskeletal: Thoracic spine degenerative changes. IMPRESSION: 1. Dense patchy opacity and consolidation in the posterior half of the left lung involving the left lower lobe and left upper lobe, compatible with pneumonia and atelectasis. 2. Milder patchy opacity in the right lung, primarily in the dependent right lower lobe, with underlying cylindrical bronchiectasis. This could be chronic or represent some superimposed infection. 3. Mild patchy ground-glass opacity in both lungs, compatible with mild interstitial pulmonary edema/inflammation. 4. Small bilateral pleural effusions. 5. Small amount of free peritoneal fluid. 6. Multiple bilateral thyroid nodules, largest on the right measuring 2.5 cm in maximum diameter. Recommend thyroid US (ref: J Am Coll Radiol. 2015 Feb;12(2): 143-50). 7. Calcific coronary artery and aortic atherosclerosis. Aortic Atherosclerosis (ICD10-I70.0). Electronically Signed   By: Claudie Revering M.D.   On: 04/14/2020 14:51   DG Chest Port 1 View  Result Date: 03/30/2020 CLINICAL DATA:  Endotracheal tube placement.  Post CPR. EXAM: PORTABLE CHEST 1 VIEW COMPARISON:  Chest x-ray dated March 13, 2020. FINDINGS: Endotracheal tube tip in good position approximately 2.8 cm above the carina. Enteric tube tip just within the stomach with the proximal side port in the lower esophagus. Chronic cardiomegaly. Pulmonary vascular congestion. Hazy perihilar opacities with consolidation in the left upper and lower lobes.  Scattered scarring in the right lung. No pneumothorax or large pleural effusion. No acute osseous abnormality. IMPRESSION: 1. Appropriately positioned endotracheal tube. 2. Enteric tube tip just within the stomach with the proximal side port in the lower esophagus. Recommend advancement. 3. Hazy perihilar opacities with consolidation in the left upper and lower lobes likely reflecting a degree of pulmonary edema with superimposed pneumonia in the left lung not excluded. Electronically Signed   By: Titus Dubin M.D.   On: 04/23/2020 11:26    Procedures Procedure Name: Intubation Date/Time: 04/01/2020 1:54 PM Performed by: Lajean Saver, MD Pre-anesthesia Checklist: Patient identified, Patient being monitored, Emergency Drugs available, Timeout performed and Suction available Oxygen Delivery Method: Ambu bag Preoxygenation: Pre-oxygenation with 100% oxygen (via King airway by EMS) Induction Type: Rapid sequence Laryngoscope Size: Glidescope Tube size: 7.5 mm Number of attempts: 1 Placement Confirmation: ETT inserted through vocal cords under direct vision,  CO2 detector and Breath sounds checked- equal and bilateral Secured at: 22 cm Tube secured with: ETT holder      (including critical care time)  Medications Ordered in ED Medications  etomidate (AMIDATE) injection (100 mg Intravenous Given 04/10/2020 1041)  succinylcholine (ANECTINE) injection (100 mg Intravenous Given 04/22/2020 1042)  albuterol (PROVENTIL) (2.5 MG/3ML) 0.083% nebulizer solution 5 mg (has no administration in time range)  ipratropium (ATROVENT) nebulizer solution 0.5 mg (has no administration in time range)    ED Course  I have reviewed the triage vital signs and the nursing notes.  Pertinent labs & imaging results that were available during my care of the patient were reviewed by me and considered in my medical decision making (see chart for details).    MDM Rules/Calculators/A&P                     Iv ns.  Continuous pulse ox and monitor. Stat labs, ecg, portable cxr.   Reviewed nursing notes and prior charts for additional history. Pt with hx chf,  copd, dm, renal insuff.   King airway changed to definitive ETT using RSI with etomidate and succ.   Albuterol and atrovent nebulized treatments.   Additional labs, cultures, covid testing sent.   Cooling measures started.   Ns boluses as bp low/soft. Fluids wide open. Initial 2 liters ns bolus.   Wheezing persists. Improved air exchange. Solumedrol iv. Additional neb tx.   Initial labs reviewed/interpted by me - abg with ph 7.0  pco2 52, po2 60.  hco3 1 amp iv. hco3 infusion started.   CXR reviewed/interpreted by me - bil infiltrates, ?pna, vs edema vs aspiration.   Post cultures sent. Iv abx given.   Additional ns boluses (total 3.5 liters ns).   Additional labs reviewed/interpreted by me - lactate very high - note prolonged down time and period of cpr. No fever. Pt has received fluid boluses. bp improved. Repeat lactate pending.  inr is 3.6.  When stable, will get head ct as pt on blood thinner therapy, and if/when going over for head ct, will get ct chest to further characterize infiltrates on cxr.   Additional hx from daughter, now present in ED - notes last night, and this AM, pt with small amount vomiting blood - does not believe pt choked or aspirated, pt was able to clear throat, and use tissue. Notes that family was helping patient get ready to go to doctors office for same. They had briefly taken off her home o2 in prep for assisting pt to vehicle. Patient became unresponsive, eyes and head rolled back, they assisted to ground and started chest compressions. They indicate pulse ox was reading very low then, but as registering, 911 advised them cpr/chest compressions not needing.  Unclear how long patient may have been without pulse or cpr. As relates dressings to bil legs, pts daughter indicates just took her for eval/dressing change  yesterday - no acute worsening or change.   Critical care team consulted - discussed pt, labs, cxr - will see/admit.  CRITICAL CARE RE: acute respiratory failure, cardiopulmonary arrest with prolonged cpr/resuscitation prior to arrival r/o anoxic brain injury, pulmonary infiltrates, acute uti, aki on crf, hematemesis, thrombocytopenia Performed by: Mirna Mires Total critical care time: 135 minutes Critical care time was exclusive of separately billable procedures and treating other patients. Critical care was necessary to treat or prevent imminent or life-threatening deterioration. Critical care was time spent personally by me on the following activities: development of treatment plan with patient and/or surrogate as well as nursing, discussions with consultants, evaluation of patient's response to treatment, examination of patient, obtaining history from patient or surrogate, ordering and performing treatments and interventions, ordering and review of laboratory studies, ordering and review of radiographic studies, pulse oximetry and re-evaluation of patient's condition.   Final Clinical Impression(s) / ED Diagnoses Final diagnoses:  None    Rx / DC Orders ED Discharge Orders    None          Lajean Saver, MD 04/05/2020 [phone]

## 2020-04-18 NOTE — Sepsis Progress Note (Signed)
Notified bedside nurse of need to administer antibiotics.  

## 2020-04-18 NOTE — Progress Notes (Signed)
EEG complete - results pending 

## 2020-04-18 NOTE — Progress Notes (Signed)
Pharmacy Antibiotic Note  Tracy [name] is a 59 y.o. female admitted on 03/31/2020 as a post cardiac arrest with concern for infection from unknown source.  Pharmacy has been consulted for Cefepime and vancomycin dosing. LA elevated at 11. Pt is hypothermic. CXR read concerning for pneumonia. SCr acutely elevated at 3.5 (BL ~ 1.3-1.5)   Plan: -Cefepime 2 gm IV Q 24 hours  -Vancomycin 2 gm IV load followed by vancomycin 1500 mg IV q 48 hours per traditional nomogram dosing  -Monitor CBC, renal fx, cultures and clinical progress -VT as necessary   Height: 5\' 5"  (165.1 cm) Weight: 110 kg (242 lb 8.1 oz) IBW/kg (Calculated) : 57  Temp (24hrs), Avg:97.4 F (36.3 C), Min:97.4 F (36.3 C), Max:97.4 F (36.3 C)  Recent Labs  Lab 04/06/2020 1044 04/02/2020 1138  CREATININE  --  3.50*  LATICACIDVEN 11.0*  --     Estimated Creatinine Clearance: 21.4 mL/min (A) (by C-G formula based on SCr of 3.5 mg/dL (H)).    No Known Allergies    Thank you for allowing pharmacy to be a part of this patient's care.  Albertina Parr, PharmD., BCPS, BCCCP Clinical Pharmacist Clinical phone for 04/11/2020 until 3:30pm: [phone] If after 3:30pm, please refer to [facility] for unit-specific pharmacist

## 2020-04-19 ENCOUNTER — Inpatient Hospital Stay (HOSPITAL_COMMUNITY): Payer: Medicare Other

## 2020-04-19 ENCOUNTER — Ambulatory Visit (HOSPITAL_COMMUNITY): Payer: Medicare Other | Admitting: Physical Therapy

## 2020-04-19 DIAGNOSIS — I361 Nonrheumatic tricuspid (valve) insufficiency: Secondary | ICD-10-CM

## 2020-04-19 DIAGNOSIS — G40909 Epilepsy, unspecified, not intractable, without status epilepticus: Secondary | ICD-10-CM

## 2020-04-19 DIAGNOSIS — J9602 Acute respiratory failure with hypercapnia: Secondary | ICD-10-CM

## 2020-04-19 DIAGNOSIS — I4891 Unspecified atrial fibrillation: Secondary | ICD-10-CM

## 2020-04-19 LAB — PHOSPHORUS: Phosphorus: 7.1 mg/dL — ABNORMAL HIGH (ref 2.5–4.6)

## 2020-04-19 LAB — COMPREHENSIVE METABOLIC PANEL
ALT: 32 U/L (ref 0–44)
AST: 45 U/L — ABNORMAL HIGH (ref 15–41)
Albumin: 1.9 g/dL — ABNORMAL LOW (ref 3.5–5.0)
Alkaline Phosphatase: 184 U/L — ABNORMAL HIGH (ref 38–126)
Anion gap: 14 (ref 5–15)
BUN: 64 mg/dL — ABNORMAL HIGH (ref 6–20)
CO2: 23 mmol/L (ref 22–32)
Calcium: 8 mg/dL — ABNORMAL LOW (ref 8.9–10.3)
Chloride: 100 mmol/L (ref 98–111)
Creatinine, Ser: 3.86 mg/dL — ABNORMAL HIGH (ref 0.44–1.00)
GFR calc Af Amer: 14 mL/min — ABNORMAL LOW (ref >60)
GFR calc non Af Amer: 12 mL/min — ABNORMAL LOW (ref >60)
Glucose, Bld: 211 mg/dL — ABNORMAL HIGH (ref 70–99)
Potassium: 4.1 mmol/L (ref 3.5–5.1)
Sodium: 137 mmol/L (ref 135–145)
Total Bilirubin: 7.4 mg/dL — ABNORMAL HIGH (ref 0.3–1.2)
Total Protein: 5.6 g/dL — ABNORMAL LOW (ref 6.5–8.1)

## 2020-04-19 LAB — POCT I-STAT 7, (LYTES, BLD GAS, ICA,H+H)
Acid-base deficit: 3 mmol/L — ABNORMAL HIGH (ref 0.0–2.0)
Acid-base deficit: 2 mmol/L (ref 0.0–2.0)
Bicarbonate: 24.2 mmol/L (ref 20.0–28.0)
Bicarbonate: 24.4 mmol/L (ref 20.0–28.0)
Calcium, Ion: 1.18 mmol/L (ref 1.15–1.40)
Calcium, Ion: 1.15 mmol/L (ref 1.15–1.40)
HCT: 40 % (ref 36.0–46.0)
HCT: 41 % (ref 36.0–46.0)
Hemoglobin: 13.6 g/dL (ref 12.0–15.0)
Hemoglobin: 13.9 g/dL (ref 12.0–15.0)
O2 Saturation: 96 %
O2 Saturation: 99 %
Patient temperature: 35.9
Patient temperature: 35.8
Potassium: 4.3 mmol/L (ref 3.5–5.1)
Potassium: 4.1 mmol/L (ref 3.5–5.1)
Sodium: 137 mmol/L (ref 135–145)
Sodium: 138 mmol/L (ref 135–145)
TCO2: 26 mmol/L (ref 22–32)
TCO2: 26 mmol/L (ref 22–32)
pCO2 arterial: 47.1 mmHg (ref 32.0–48.0)
pCO2 arterial: 42.4 mmHg (ref 32.0–48.0)
pH, Arterial: 7.313 — ABNORMAL LOW (ref 7.350–7.450)
pH, Arterial: 7.362 (ref 7.350–7.450)
pO2, Arterial: 85 mmHg (ref 83.0–108.0)
pO2, Arterial: 122 mmHg — ABNORMAL HIGH (ref 83.0–108.0)

## 2020-04-19 LAB — CBC
HCT: 39.3 % (ref 36.0–46.0)
Hemoglobin: 11.1 g/dL — ABNORMAL LOW (ref 12.0–15.0)
MCH: 31.3 pg (ref 26.0–34.0)
MCHC: 28.2 g/dL — ABNORMAL LOW (ref 30.0–36.0)
MCV: 110.7 fL — ABNORMAL HIGH (ref 80.0–100.0)
Platelets: 58 10*3/uL — ABNORMAL LOW (ref 150–400)
RBC: 3.55 MIL/uL — ABNORMAL LOW (ref 3.87–5.11)
RDW: 30 % — ABNORMAL HIGH (ref 11.5–15.5)
WBC: 16.4 10*3/uL — ABNORMAL HIGH (ref 4.0–10.5)
nRBC: 1.3 % — ABNORMAL HIGH (ref 0.0–0.2)

## 2020-04-19 LAB — ECHOCARDIOGRAM COMPLETE
Height: 65 in
Weight: 3992.97 oz

## 2020-04-19 LAB — PROTIME-INR
INR: 2.9 — ABNORMAL HIGH (ref 0.8–1.2)
Prothrombin Time: 29.9 seconds — ABNORMAL HIGH (ref 11.4–15.2)

## 2020-04-19 LAB — MAGNESIUM: Magnesium: 2.1 mg/dL (ref 1.7–2.4)

## 2020-04-19 LAB — RENAL FUNCTION PANEL
Albumin: 2.1 g/dL — ABNORMAL LOW (ref 3.5–5.0)
Anion gap: 19 — ABNORMAL HIGH (ref 5–15)
BUN: 62 mg/dL — ABNORMAL HIGH (ref 6–20)
CO2: 20 mmol/L — ABNORMAL LOW (ref 22–32)
Calcium: 8.8 mg/dL — ABNORMAL LOW (ref 8.9–10.3)
Chloride: 98 mmol/L (ref 98–111)
Creatinine, Ser: 3.62 mg/dL — ABNORMAL HIGH (ref 0.44–1.00)
GFR calc Af Amer: 15 mL/min — ABNORMAL LOW (ref >60)
GFR calc non Af Amer: 13 mL/min — ABNORMAL LOW (ref >60)
Glucose, Bld: 90 mg/dL (ref 70–99)
Phosphorus: 7.2 mg/dL — ABNORMAL HIGH (ref 2.5–4.6)
Potassium: 4.5 mmol/L (ref 3.5–5.1)
Sodium: 137 mmol/L (ref 135–145)

## 2020-04-19 LAB — GLUCOSE, CAPILLARY
Glucose-Capillary: 196 mg/dL — ABNORMAL HIGH (ref 70–99)
Glucose-Capillary: 161 mg/dL — ABNORMAL HIGH (ref 70–99)
Glucose-Capillary: 150 mg/dL — ABNORMAL HIGH (ref 70–99)
Glucose-Capillary: 80 mg/dL (ref 70–99)

## 2020-04-19 LAB — LACTIC ACID, PLASMA
Lactic Acid, Venous: 6.9 mmol/L (ref 0.5–1.9)
Lactic Acid, Venous: 6.4 mmol/L (ref 0.5–1.9)
Lactic Acid, Venous: 8.3 mmol/L (ref 0.5–1.9)

## 2020-04-19 LAB — BILIRUBIN, FRACTIONATED(TOT/DIR/INDIR)
Bilirubin, Direct: 4.6 mg/dL — ABNORMAL HIGH (ref 0.0–0.2)
Indirect Bilirubin: 2.8 mg/dL — ABNORMAL HIGH (ref 0.3–0.9)
Total Bilirubin: 7.4 mg/dL — ABNORMAL HIGH (ref 0.3–1.2)

## 2020-04-19 LAB — VITAMIN B12: Vitamin B-12: 2847 pg/mL — ABNORMAL HIGH (ref 180–914)

## 2020-04-19 LAB — PROCALCITONIN: Procalcitonin: 14.96 ng/mL

## 2020-04-19 MED ORDER — AMIODARONE HCL IN DEXTROSE 360-4.14 MG/200ML-% IV SOLN
30.0000 mg/h | INTRAVENOUS | Status: DC
  Administered 2020-04-20 – 2020-04-22 (×3): 30 mg/h via INTRAVENOUS
  Administered 2020-04-22 – 2020-04-23 (×3): 60 mg/h via INTRAVENOUS
  Filled 2020-04-19: qty 400
  Filled 2020-04-19 (×7): qty 200

## 2020-04-19 MED ORDER — BUDESONIDE 0.5 MG/2ML IN SUSP
0.5000 mg | Freq: Two times a day (BID) | RESPIRATORY_TRACT | Status: DC
  Administered 2020-04-19 – 2020-04-24 (×11): 0.5 mg via RESPIRATORY_TRACT
  Filled 2020-04-19 (×11): qty 2

## 2020-04-19 MED ORDER — AMIODARONE HCL IN DEXTROSE 360-4.14 MG/200ML-% IV SOLN
60.0000 mg/h | INTRAVENOUS | Status: DC
  Administered 2020-04-19: 60 mg/h via INTRAVENOUS
  Filled 2020-04-19 (×2): qty 200

## 2020-04-19 MED ORDER — CALCIUM GLUCONATE-NACL 1-0.675 GM/50ML-% IV SOLN
1.0000 g | Freq: Once | INTRAVENOUS | Status: AC
  Administered 2020-04-19: 1000 mg via INTRAVENOUS
  Filled 2020-04-19: qty 50

## 2020-04-19 MED ORDER — HEPARIN SODIUM (PORCINE) 1000 UNIT/ML DIALYSIS
1000.0000 [IU] | INTRAMUSCULAR | Status: DC | PRN
  Filled 2020-04-19: qty 6

## 2020-04-19 MED ORDER — HEPARIN (PORCINE) 2000 UNITS/L FOR CRRT
INTRAVENOUS_CENTRAL | Status: DC | PRN
  Filled 2020-04-19: qty 1000

## 2020-04-19 MED ORDER — PRISMASOL BGK 4/2.5 32-4-2.5 MEQ/L IV SOLN: INTRAVENOUS | Status: DC

## 2020-04-19 MED ORDER — PRISMASOL BGK 4/2.5 32-4-2.5 MEQ/L REPLACEMENT SOLN: Status: DC

## 2020-04-19 MED ORDER — SODIUM CHLORIDE 0.9 % IV SOLN
2.0000 g | Freq: Two times a day (BID) | INTRAVENOUS | Status: DC
  Administered 2020-04-19 – 2020-04-21 (×4): 2 g via INTRAVENOUS
  Filled 2020-04-19 (×5): qty 2

## 2020-04-19 MED ORDER — VITAL HIGH PROTEIN PO LIQD
1000.0000 mL | ORAL | Status: DC
  Administered 2020-04-20: 1000 mL

## 2020-04-19 MED ORDER — ARFORMOTEROL TARTRATE 15 MCG/2ML IN NEBU
15.0000 ug | INHALATION_SOLUTION | Freq: Two times a day (BID) | RESPIRATORY_TRACT | Status: DC
  Administered 2020-04-19: 08:00:00 15 ug via RESPIRATORY_TRACT
  Filled 2020-04-19: qty 2

## 2020-04-19 MED ORDER — IPRATROPIUM-ALBUTEROL 0.5-2.5 (3) MG/3ML IN SOLN
3.0000 mL | Freq: Four times a day (QID) | RESPIRATORY_TRACT | Status: DC
  Administered 2020-04-19 – 2020-04-24 (×20): 3 mL via RESPIRATORY_TRACT
  Filled 2020-04-19 (×17): qty 3
  Filled 2020-04-19: qty 30
  Filled 2020-04-19 (×2): qty 3

## 2020-04-19 MED ORDER — SODIUM CHLORIDE 0.9 % IV SOLN
2.0000 g | INTRAVENOUS | Status: DC
  Administered 2020-04-19: 14:00:00 2 g via INTRAVENOUS
  Filled 2020-04-19: qty 2

## 2020-04-19 MED ORDER — INSULIN ASPART 100 UNIT/ML ~~LOC~~ SOLN
3.0000 [IU] | SUBCUTANEOUS | Status: DC
  Administered 2020-04-19: 3 [IU] via SUBCUTANEOUS

## 2020-04-19 MED ORDER — MAGNESIUM SULFATE IN D5W 1-5 GM/100ML-% IV SOLN
1.0000 g | Freq: Once | INTRAVENOUS | Status: AC
  Administered 2020-04-19: 17:00:00 1 g via INTRAVENOUS
  Filled 2020-04-19: qty 100

## 2020-04-19 MED ORDER — LEVETIRACETAM IN NACL 1000 MG/100ML IV SOLN
1000.0000 mg | Freq: Once | INTRAVENOUS | Status: AC
  Administered 2020-04-19: 1000 mg via INTRAVENOUS
  Filled 2020-04-19: qty 100

## 2020-04-19 MED ORDER — POLYVINYL ALCOHOL 1.4 % OP SOLN
1.0000 [drp] | OPHTHALMIC | Status: DC | PRN
  Filled 2020-04-19: qty 15

## 2020-04-19 MED ORDER — AMIODARONE LOAD VIA INFUSION
150.0000 mg | Freq: Once | INTRAVENOUS | Status: AC
  Administered 2020-04-19: 150 mg via INTRAVENOUS
  Filled 2020-04-19: qty 83.34

## 2020-04-19 MED ORDER — IPRATROPIUM-ALBUTEROL 0.5-2.5 (3) MG/3ML IN SOLN: 3.0000 mL | Freq: Four times a day (QID) | RESPIRATORY_TRACT | Status: DC

## 2020-04-19 MED ORDER — MIDAZOLAM HCL 2 MG/2ML IJ SOLN
1.0000 mg | INTRAMUSCULAR | Status: DC | PRN
  Administered 2020-04-19 – 2020-04-21 (×3): 1 mg via INTRAVENOUS
  Filled 2020-04-19 (×3): qty 2

## 2020-04-19 MED ORDER — LEVETIRACETAM IN NACL 500 MG/100ML IV SOLN
500.0000 mg | Freq: Two times a day (BID) | INTRAVENOUS | Status: DC
  Administered 2020-04-19 – 2020-04-22 (×6): 500 mg via INTRAVENOUS
  Filled 2020-04-19 (×6): qty 100

## 2020-04-19 NOTE — Consult Note (Signed)
[facility] Nurse Consult Note: Patient receiving care in [place].  Consult completed remotely after review of record. Reason for Consult: Patient with unna boots, wounds on legs.   Wound type: Wounds related to lymphedema.  Patient is followed by PT OP for compression wrap placement for lymphedema.  Last PT note 04/14/20. Pressure Injury POA: Yes/No/NA Measurement: To be provided by the bedside RN in the flowsheet section Wound bed: To be provided by the bedside RN in the flowsheet section Drainage (amount, consistency, odor) To be provided by the bedside RN in the flowsheet section Periwound: To be provided by the bedside RN in the flowsheet section Dressing procedure/placement/frequency: I have placed orders to remove existing unna boots, cleanse the BLE, place xeroform gauzes Kellie Simmering [phone]) over any open wounds, then call for the Ortho Tech to apply the [employer].  I have also added orders for a week from today in the event the patient remains in hospital. Monitor the wound area(s) for worsening of condition such as: Signs/symptoms of infection,  Increase in size,  Development of or worsening of odor, Development of pain, or increased pain at the affected locations.  Notify the medical team if any of these develop.  Thank you for the consult.  [facility] nurse will not follow at this time.  Please re-consult the [facility] team if needed.  Val Riles, RN, MSN, CWOCN, CNS-BC, pager [phone]

## 2020-04-19 NOTE — Progress Notes (Signed)
[facility] Progress Note Patient Name: TAHJAE DURR DOB: Apr 26, 1961 MRN: [id number]   Date of Service  04/19/2020  HPI/Events of Note  ABG on 100%/PRVC 20/TV 450/P 8 = 7.313/47.1/85.0   eICU Interventions  Will order: 1. Increase PEEP to 10.     Intervention Category Major Interventions: Respiratory failure - evaluation and management  Daje Stark [place] 04/19/2020, 5:25 AM

## 2020-04-19 NOTE — Consult Note (Addendum)
The patient has been seen in conjunction with Sande Rives, PAC. All aspects of care have been considered and discussed. The patient has been personally interviewed, examined, and all clinical data has been reviewed.   H/O Paroxysmal atrial fibrillation, now s/p OOH cardiac arrest. Lactic acid >11 on admission labs, and still requiring pressor therapy for BP support. Has minimal elevation in hs-Trop I and moderate baseline BNP elevation of 930.  Echo shows preserved LV function with large hypocontractile RV, c/w chronic pulmonary hypertension/COPD/? OSA.  IV amiodarone is reasonable to attempt AF rate control. In addition digoxin may be helpful. Levophed is not contributing to HR.  Overall outlook is worrisome.   Cardiology Consultation:   Patient ID: Tracy [name] MRN: [id number]; DOB: 07-Mar-1961  Admit date: 04/09/2020 Date of Consult: 04/19/2020  Primary Care Provider: Rory Percy, MD Primary Cardiologist: Kate Sable, MD  Primary Electrophysiologist:  None   Patient Profile:   Tracy [name] is a 59 y.o. 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 Lamonte Sakai.  History of Present Illness:   Tracy [name] is a 59 year old female with the above history who is followed by Dr. Bronson Ing. She was recently admitted to [facility] in 02/2020 for sepsis secondary to UTI after presenting with hypoglycemia and altered mental status. Was also felt to have COPD and CHF exacerbations at that time. Cardiology was consulted in regards to her CHF and was diuresed with IV Lasix with the help  from Nephrology. At follow-up in our office with Bernerd Pho, PA-C, on 03/28/2020, it was reported that patient had 2 falls earlier that day due to her legs just giving out on her. She also noted continued lower extremity edema with an open wound on her left leg for which PCP prescribed Keflex for the week prior to visit. Home Health had called the office the day before this reporting systolic BP in the 44'Y; therefore, home Lopressor was stopped. BP 106/64 in the office.  Patient presented to the [facility] ED via EMS after a witnessed cardiac arrest. Per H&P, patient had reportedly been complaining of shortness of breath before presentation and had a few episodes of hemoptysis. She was about to go see her orthopedist when she "slumped to chair at home". She was moved to the floor and 911 was called. Bystander started CPR for several minutes. Pulse returned so CPR was stopped for about 1 minute during which EMS arrived and found he pulses and apneic. They continued ACL for about 5 minutes.  Per triage note, CPR was started around 9:32am and ROSC was obtained at 9:56am. She received 5 dose of Epi and 1 amp of Bicarb by EMS as well as 1 L of normal saline en route to the ED. Patient reportedly went into ventricular fibrillation after last round of Epi and Bicarb and was defibrillated. King airway was placed in the field and then patient was intubated in the ED. Arrest felt to be respiratory driven given COPD and suspected obstructive sleep apnea with worsening right sided heart failure.   In the ED: EKG showed atrial fibrillation with RBBB. BNP elevated at 933. Chest x-ray showed hazy perihilar opacities with  consolidation in the left upper and lower lobes likely reflecting a degree of pulmonary edemw with superimposed pneumonia in the left lung not excluded. Chest CT showed dense patchy opacity and consolidation in the posterior half of the left lung involving the left lower lobe and left upper lobe  compatible with pneumonia and atelectasis with milder patchy opacitiy in the right lung which could represent some superimposed infection. Also noted to have mild patchy ground-glass opacity in both lungs compatible with mild interstitial pulmonary edema/inflammation and small bilateral pleural effusion. Head CT showed no acute findings. CBC 18.1, Hgb 11.6, Plts 74. Lactic acid 11.0. Na 125, K 4.5, BUN 64, Cr 3.50, Glucose 120. COVID-19 negative. Cultures pending. Urine drug screen positive for opiates.  At the time of this evaluation, patient still intubated, sedated, and being cooled. No family present so entire history obtained from chart review.  Past Medical History:  Diagnosis Date  . Acute ischemic stroke ([place]) 07/03/2012  . Crack cocaine use   . Diabetes ([place])   . Diabetes mellitus ([place])   . DVT (deep venous thrombosis) ([place])   . Hemiparesis, acute ([place]) 07/04/2012  . Hypertension   . LVH (left ventricular hypertrophy) 07/04/2012   Ejection fraction 60%.  . Myocardial infarct, old   . Thrombocytopenia ([place]) 07/03/2012  . Tobacco abuse     Past Surgical History:  Procedure Laterality Date  . ABDOMINAL HYSTERECTOMY    . HERNIA REPAIR       Home Medications:  Prior to Admission medications   Medication Sig Start Date End Date Taking? Authorizing Provider  acetaminophen (TYLENOL) 325 MG tablet Take 2 tablets (650 mg total) by mouth every 6 (six) hours as needed for mild pain (or Fever >/= 101). 02/21/20  Yes Emokpae, Courage, MD  albuterol (PROVENTIL) (2.5 MG/3ML) 0.083% nebulizer solution Take 3 mLs (2.5 mg total) by nebulization every 4 (four) hours as needed for wheezing or shortness of breath. 02/21/20  Yes Emokpae, Courage, MD  albuterol (VENTOLIN HFA) 108 (90 Base) MCG/ACT inhaler Inhale 2 puffs into the lungs every 4 (four) hours as needed for wheezing or shortness of breath. 02/21/20  Yes Emokpae, Courage, MD  apixaban (ELIQUIS) 5 MG TABS tablet Take 1 tablet (5 mg total) by mouth 2  (two) times daily. 04/14/20 05/14/20 Yes Herminio Commons, MD  atorvastatin (LIPITOR) 10 MG tablet Take 1 tablet (10 mg total) by mouth every evening. 04/14/20  Yes Herminio Commons, MD  budesonide-formoterol (SYMBICORT) 160-4.5 MCG/ACT inhaler Inhale 2 puffs into the lungs 2 (two) times daily. 02/21/20  Yes Emokpae, Courage, MD  cholecalciferol (VITAMIN D3) 25 MCG (1000 UNIT) tablet Take 1,000 Units by mouth daily.   Yes [provider]  diltiazem (CARDIZEM CD) 120 MG 24 hr capsule Take 1 capsule (120 mg total) by mouth daily. 04/14/20 05/14/20 Yes Herminio Commons, MD  furosemide (LASIX) 40 MG tablet Take 1.5 tablets (60 mg total) by mouth daily. 04/14/20  Yes Herminio Commons, MD  HYDROcodone-acetaminophen (NORCO) 7.5-325 MG tablet One tablet as needed every six hours for pain. 04/06/20  Yes Sanjuana Kava, MD  linagliptin (TRADJENTA) 5 MG TABS tablet Take 1 tablet (5 mg total) by mouth daily. 03/20/20  Yes Johnson, Clanford L, MD  mirtazapine (REMERON) 7.5 MG tablet Take 7.5 mg by mouth at bedtime. 03/24/20  Yes [provider]    Inpatient Medications: Scheduled Meds: . budesonide (PULMICORT) nebulizer solution  0.5 mg Nebulization BID  . chlorhexidine gluconate (MEDLINE KIT)  15 mL  Mouth Rinse BID  . Chlorhexidine Gluconate Cloth  6 each Topical Daily  . insulin aspart  3-9 Units Subcutaneous Q4H  . ipratropium-albuterol  3 mL Nebulization Q6H  . mouth rinse  15 mL Mouth Rinse 10 times per day  . pantoprazole (PROTONIX) IV  40 mg Intravenous QHS   Continuous Infusions: . sodium chloride    . ceFEPime (MAXIPIME) IV 2 g (04/19/20 1331)  . fentaNYL infusion INTRAVENOUS 400 mcg/hr (04/19/20 1200)  . levETIRAcetam    . norepinephrine (LEVOPHED) Adult infusion 5 mcg/min (04/19/20 1200)   PRN Meds: docusate sodium, midazolam, polyethylene glycol, polyvinyl alcohol  Allergies:   No Known Allergies  Social History:   Social History   Socioeconomic History  .  Marital status: Widowed    Spouse name: Not on file  . Number of children: Not on file  . Years of education: Not on file  . Highest education level: Not on file  Occupational History  . Not on file  Tobacco Use  . Smoking status: Current Every Day Smoker    Packs/day: 1.00    Types: Cigarettes  . Smokeless tobacco: Never Used  Substance and Sexual Activity  . Alcohol use: No  . Drug use: No    Comment: last used 2 years ago  . Sexual activity: Never    Birth control/protection: Surgical  Other Topics Concern  . Not on file  Social History Narrative  . Not on file   Social Determinants of Health   Financial Resource Strain:   . Difficulty of Paying Living Expenses:   Food Insecurity:   . Worried About Charity fundraiser in the Last Year:   . Arboriculturist in the Last Year:   Transportation Needs:   . Film/video editor (Medical):   [name] Lack of Transportation (Non-Medical):   Physical Activity:   . Days of Exercise per Week:   . Minutes of Exercise per Session:   Stress:   . Feeling of Stress :   Social Connections:   . Frequency of Communication with Friends and Family:   . Frequency of Social Gatherings with Friends and Family:   . Attends Religious Services:   . Active Member of Clubs or Organizations:   . Attends Archivist Meetings:   [name] Marital Status:   Intimate Partner Violence:   . Fear of Current or Ex-Partner:   . Emotionally Abused:   [name] Physically Abused:   . Sexually Abused:     Family History:   Family History  Problem Relation Age of Onset  . Heart disease Mother   . Diabetes Mother   . Stroke Mother   . Hypertension Mother   . Hypertension Father   . Heart disease Father   . Diabetes Brother      ROS:  Please see the history of present illness.  Review of Systems  Unable to perform ROS: Intubated    Physical Exam/Data:   Vitals:   04/19/20 1230 04/19/20 1245 04/19/20 1300 04/19/20 1330  BP: (!) 123/93 (!) 120/95   (!) 107/91  Pulse:   75   Resp: _0 Temp:      TempSrc:      SpO2:   (!) 67%   Weight:      Height:        Intake/Output Summary (Last 24 hours) at 04/19/2020 1352 Last data filed at 04/19/2020 1200 Gross per 24 hour  Intake 3124.09 ml  Output  115 ml  Net 3009.09 ml   Last 3 Weights 04/19/2020 04/03/2020 03/28/2020  Weight (lbs) 249 lb 9 oz 242 lb 8.1 oz 243 lb  Weight (kg) 113.2 kg 110 kg 110.224 kg     Body mass index is 41.53 kg/m.  General: 59 y.o. female. Intubated, sedated, and being cooled. HEENT: Normocephalic and atraumatic.  Neck: Supple. JVD unable to be assessed due to patient position. Heart: Irregularly irregular rhythm with normal rate. Distinct S1 and S2. No significant murmurs, gallops, or rubs. Radial pulses 2+ and equal bilaterally. Lungs: Intubated. Clear to ausculation anteriorly.  Abdomen: Soft and non-tender but slightly distended. Bowel sounds difficult to appreciated. Extremities: 1-2+ pitting edema of bilateral lower extremities up to thighs. Both legs wrapped. Neuro: Intubated and sedated. Psych: Intubated and sedated.   EKG:  The EKG was personally reviewed and demonstrates:  Atrial fibrillation, rate 122 bpm, with RBBB and inferior Q waves. Telemetry:  Telemetry was personally reviewed and demonstrates: Atrial fibrillation with baseline rates int he 110's to 120's but as high as the 140's to 150's.  Relevant CV Studies:  Echocardiogram 04/19/2020: Impressions: 1. Left ventricular ejection fraction, by estimation, is 55 to 60%. The  left ventricle has normal function. The left ventricle has no regional  wall motion abnormalities. There is mild concentric left ventricular  hypertrophy. Left ventricular diastolic  function could not be evaluated. There is the interventricular septum is  flattened in systole and diastole, consistent with right ventricular  pressure and volume overload.  2. RVSP underestimated in setting of severe TR. Right  ventricular  systolic function is severely reduced. The right ventricular size is  severely enlarged. There is mildly elevated pulmonary artery systolic  pressure. The estimated right ventricular  systolic pressure is 31.4 mmHg.  3. Right atrial size was severely dilated.  4. Moderate pericardial effusion. The pericardial effusion is posterior  to the left ventricle.  5. The mitral valve is grossly normal. Trivial mitral valve  regurgitation. No evidence of mitral stenosis.  6. Tricuspid valve regurgitation is severe.  7. The aortic valve is tricuspid. Aortic valve regurgitation is not  visualized. Mild aortic valve sclerosis is present, with no evidence of  aortic valve stenosis.  8. The inferior vena cava is dilated in size with <50% respiratory  variability, suggesting right atrial pressure of 15 mmHg.   Comparison(s): Changes from prior study are noted. Moderate pericardial  effusion now present. Severe RV dilation and severely reduced RV function  remains.   Conclusion(s)/Recommendation(s): Findings consistent with Cor Pulmonale.  Laboratory Data:  High Sensitivity Troponin:   Recent Labs  Lab 04/06/2020 1404  TROPONINIHS 57*     Chemistry Recent Labs  Lab 03/30/2020 1138 04/25/2020 1138 04/19/2020 1404 04/14/2020 1444 04/19/20 0313 04/19/20 0508 04/19/20 1307  NA 135   < > 137   < > 137 137 138  K 4.5   < > 4.5   < > 4.1 4.3 4.1  CL 105  --  103  --  100  --   --   CO2  --   --  15*  --  23  --   --   GLUCOSE 120*  --  191*  --  211*  --   --   BUN 64*  --  65*  --  64*  --   --   CREATININE 3.50*  --  3.56*  --  3.86*  --   --   CALCIUM  --   --  8.0*  --  8.0*  --   --   GFRNONAA  --   --  13*  --  12*  --   --   GFRAA  --   --  15*  --  14*  --   --   ANIONGAP  --   --  19*  --  14  --   --    < > = values in this interval not displayed.    Recent Labs  Lab 04/06/2020 1404 04/19/20 0313 04/19/20 0815  PROT 5.3* 5.6*  --   ALBUMIN 1.9* 1.9*  --   AST 34  45*  --   ALT 28 32  --   ALKPHOS 184* 184*  --   BILITOT 7.4* 7.4* 7.4*   Hematology Recent Labs  Lab 04/15/2020 1044 03/30/2020 1058 04/19/20 0313 04/19/20 0508 04/19/20 1307  WBC 18.1*  --  16.4*  --   --   RBC 3.65*  --  3.55*  --   --   HGB 11.6*   < > 11.1* 13.6 13.9  HCT 42.4   < > 39.3 40.0 41.0  MCV 116.2*  --  110.7*  --   --   MCH 31.8  --  31.3  --   --   MCHC 27.4*  --  28.2*  --   --   RDW Not Measured  --  30.0*  --   --   PLT 74*  --  58*  --   --    < > = values in this interval not displayed.   BNP Recent Labs  Lab 04/03/2020 1040  BNP 933.6*    DDimer No results for input(s): DDIMER in the last 168 hours.   Radiology/Studies:  CT HEAD WO CONTRAST  Result Date: 04/19/2020 CLINICAL DATA:  Altered mental status. EXAM: CT HEAD WITHOUT CONTRAST TECHNIQUE: Contiguous axial images were obtained from the base of the skull through the vertex without intravenous contrast. COMPARISON:  March 13, 2020. FINDINGS: Brain: Mild diffuse cortical atrophy is noted. Mild chronic ischemic white matter disease is noted. No mass effect or midline shift is noted. Ventricular size is within normal limits. There is no evidence of mass lesion, hemorrhage or acute infarction. Vascular: No hyperdense vessel or unexpected calcification. Skull: Normal. Negative for fracture or focal lesion. Sinuses/Orbits: No acute finding. Other: None. IMPRESSION: Mild diffuse cortical atrophy. Mild chronic ischemic white matter disease. No acute intracranial abnormality seen. Electronically Signed   By: Marijo Conception M.D.   On: 04/10/2020 14:46   CT Chest Wo Contrast  Result Date: 03/31/2020 CLINICAL DATA:  Hemoptysis. EXAM: CT CHEST WITHOUT CONTRAST TECHNIQUE: Multidetector CT imaging of the chest was performed following the standard protocol without IV contrast. COMPARISON:  03/21/2015. Portable chest obtained earlier today. FINDINGS: Cardiovascular: Stable enlarged heart. Small pericardial effusion with  a maximum thickness of 1.2 cm, not previously present. Atheromatous calcifications, including the coronary arteries and aorta. Mediastinum/Nodes: Endotracheal tube tip at the top of the carina. Feeding tube extending into the stomach. Multiple bilateral thyroid nodules. The largest is on the right measuring 2.5 cm in maximum diameter. Enlarged precarinal node with a short axis diameter of 21 mm, previously 19 mm. No other enlarged lymph nodes are seen. Lungs/Pleura: Small bilateral pleural effusions. Dense patchy opacity and consolidation in the posterior half of the left lung involving the left lower lobe and left upper lobe. Milder patchy opacity in the right lung, primarily in the dependent right  lower lobe, with underlying cylindrical bronchiectasis. Mild patchy ground-glass opacity in both lungs. Upper Abdomen: Small amount of free peritoneal fluid. Musculoskeletal: Thoracic spine degenerative changes. IMPRESSION: 1. Dense patchy opacity and consolidation in the posterior half of the left lung involving the left lower lobe and left upper lobe, compatible with pneumonia and atelectasis. 2. Milder patchy opacity in the right lung, primarily in the dependent right lower lobe, with underlying cylindrical bronchiectasis. This could be chronic or represent some superimposed infection. 3. Mild patchy ground-glass opacity in both lungs, compatible with mild interstitial pulmonary edema/inflammation. 4. Small bilateral pleural effusions. 5. Small amount of free peritoneal fluid. 6. Multiple bilateral thyroid nodules, largest on the right measuring 2.5 cm in maximum diameter. Recommend thyroid US (ref: J Am Coll Radiol. 2015 Feb;12(2): 143-50). 7. Calcific coronary artery and aortic atherosclerosis. Aortic Atherosclerosis (ICD10-I70.0). Electronically Signed   By: Claudie Revering M.D.   On: 04/20/2020 14:51   DG Chest Port 1 View  Result Date: 04/19/2020 CLINICAL DATA:  Cardiac arrest. EXAM: PORTABLE CHEST 1 VIEW  COMPARISON:  04/20/2020 FINDINGS: The endotracheal tube is 2.4 cm above the carina. The NG tube is coursing down the esophagus and into the stomach. External pacer paddles are again noted. Stable cardiac enlargement and age advanced calcification of the thoracic aorta. Slight worsening lung aeration probably in part due to lower lung volumes. Persistent interstitial and airspace process without large pleural effusions. IMPRESSION: 1. The endotracheal tube is 2.4 cm above the carina. 2. NG tube in good position. 3. Slight worsening lung aeration. Electronically Signed   By: Marijo Sanes M.D.   On: 04/19/2020 08:47   DG Chest Port 1 View  Result Date: 04/25/2020 CLINICAL DATA:  Endotracheal tube placement.  Post CPR. EXAM: PORTABLE CHEST 1 VIEW COMPARISON:  Chest x-ray dated March 13, 2020. FINDINGS: Endotracheal tube tip in good position approximately 2.8 cm above the carina. Enteric tube tip just within the stomach with the proximal side port in the lower esophagus. Chronic cardiomegaly. Pulmonary vascular congestion. Hazy perihilar opacities with consolidation in the left upper and lower lobes. Scattered scarring in the right lung. No pneumothorax or large pleural effusion. No acute osseous abnormality. IMPRESSION: 1. Appropriately positioned endotracheal tube. 2. Enteric tube tip just within the stomach with the proximal side port in the lower esophagus. Recommend advancement. 3. Hazy perihilar opacities with consolidation in the left upper and lower lobes likely reflecting a degree of pulmonary edema with superimposed pneumonia in the left lung not excluded. Electronically Signed   By: Titus Dubin M.D.   On: 04/06/2020 11:26   EEG adult  Result Date: 04/19/2020 Tracy Havens, MD     04/19/2020  8:42 AM Patient Name: Tracy [name] MRN: [id number] Epilepsy Attending: Lora [name] Referring Physician/Provider: Dr Neva Seat Date: 04/04/2020 Duration: 20.33 mins Patient history: 59yo F  s/p cardiac arrest on TTM. EEG to evaluate for seizure Level of alertness: comatose AEDs during EEG study: LEV Technical aspects: This EEG study was done with scalp electrodes positioned according to the 10-20 International system of electrode placement. Electrical activity was acquired at a sampling rate of 500Hz and reviewed with a high frequency filter of 70Hz and a low frequency filter of 1Hz. EEG data were recorded continuously and digitally stored. DESCRIPTION: EEG showed continuous generalized background attenuation. Reactivity was not tested during this study. Hyperventilation and photic stimulation were not performed. ABNORMALITY - Background attenuation, generalized IMPRESSION: This study is suggestive of profound diffuse encephalopathy, non specific  to etiology. No seizures or epileptiform discharges were seen throughout the recording. Priyanka Barbra Sarks   Overnight EEG with video  Result Date: 04/19/2020 Tracy Havens, MD     04/19/2020  8:52 AM Patient Name: Tracy [name] MRN: [id number] Epilepsy Attending: Lora [name] Referring Physician/Provider: Dr Neva Seat Duration: 04/28/2020 2050 to 04/19/2020 0845  Patient history: 59yo F s/p cardiac arrest on TTM. EEG to evaluate for seizure  Level of alertness: comatose  AEDs during EEG study: LEV  Technical aspects: This EEG study was done with scalp electrodes positioned according to the 10-20 International system of electrode placement. Electrical activity was acquired at a sampling rate of 500Hz and reviewed with a high frequency filter of 70Hz and a low frequency filter of 1Hz. EEG data were recorded continuously and digitally stored.  DESCRIPTION: EEG showed continuous generalized background attenuation. After around 0445 on 04/19/2020, eeg showed intermittent generalized spikes.  Reactivity was not tested during this study. Hyperventilation and photic stimulation were not performed.  ABNORMALITY - Background attenuation,  generalized  IMPRESSION: This study is suggestive of profound diffuse encephalopathy, non specific to etiology. No seizures or epileptiform discharges were seen throughout the recording. Tracy [name]   ECHOCARDIOGRAM COMPLETE  Result Date: 04/19/2020    ECHOCARDIOGRAM REPORT   Patient Name:   Tracy [name] Date of Exam: 04/19/2020 Medical Rec #:  [id number]             Height:       65.0 in Accession #:    [id number]            Weight:       249.6 lb Date of Birth:  15-May-1961            BSA:          2.173 m Patient Age:    71 years              BP:           119/74 mmHg Patient Gender: F                     HR:           102 bpm. Exam Location:  Inpatient Procedure: 2D Echo, Cardiac Doppler and Color Doppler Indications:    Cardiac arrest  History:        Patient has prior history of Echocardiogram examinations, most                 recent 02/16/2020. Previous Myocardial Infarction, Stroke; Risk                 Factors:Dyslipidemia and Current Smoker. Substanace abuse.  Sonographer:    Mikki Santee RDCS (AE) Referring Phys: [place]  1. Left ventricular ejection fraction, by estimation, is 55 to 60%. The left ventricle has normal function. The left ventricle has no regional wall motion abnormalities. There is mild concentric left ventricular hypertrophy. Left ventricular diastolic function could not be evaluated. There is the interventricular septum is flattened in systole and diastole, consistent with right ventricular pressure and volume overload.  2. RVSP underestimated in setting of severe TR. Right ventricular systolic function is severely reduced. The right ventricular size is severely enlarged. There is mildly elevated pulmonary artery systolic pressure. The estimated right ventricular systolic pressure is 54.9 mmHg.  3. Right atrial size was severely dilated.  4. Moderate pericardial effusion. The pericardial effusion is posterior to the left  ventricle.  5. The  mitral valve is grossly normal. Trivial mitral valve regurgitation. No evidence of mitral stenosis.  6. Tricuspid valve regurgitation is severe.  7. The aortic valve is tricuspid. Aortic valve regurgitation is not visualized. Mild aortic valve sclerosis is present, with no evidence of aortic valve stenosis.  8. The inferior vena cava is dilated in size with <50% respiratory variability, suggesting right atrial pressure of 15 mmHg. Comparison(s): Changes from prior study are noted. Moderate pericardial effusion now present. Severe RV dilation and severely reduced RV function remains. Conclusion(s)/Recommendation(s): Findings consistent with Cor Pulmonale. FINDINGS  Left Ventricle: Left ventricular ejection fraction, by estimation, is 55 to 60%. The left ventricle has normal function. The left ventricle has no regional wall motion abnormalities. The left ventricular internal cavity size was small. There is mild concentric left ventricular hypertrophy. The interventricular septum is flattened in systole and diastole, consistent with right ventricular pressure and volume overload. Left ventricular diastolic function could not be evaluated due to atrial fibrillation. Left ventricular diastolic function could not be evaluated. Right Ventricle: RVSP underestimated in setting of severe TR. The right ventricular size is severely enlarged. No increase in right ventricular wall thickness. Right ventricular systolic function is severely reduced. There is mildly elevated pulmonary artery systolic pressure. The tricuspid regurgitant velocity is 2.71 m/s, and with an assumed right atrial pressure of 15 mmHg, the estimated right ventricular systolic pressure is 41.9 mmHg. Left Atrium: Left atrial size was normal in size. Right Atrium: Right atrial size was severely dilated. Pericardium: A moderately sized pericardial effusion is present. The pericardial effusion is posterior to the left ventricle. Presence of pericardial fat pad.  Mitral Valve: The mitral valve is grossly normal. Trivial mitral valve regurgitation. No evidence of mitral valve stenosis. Tricuspid Valve: The tricuspid valve is grossly normal. Tricuspid valve regurgitation is severe. No evidence of tricuspid stenosis. The flow in the hepatic veins is reversed during ventricular systole. Aortic Valve: The aortic valve is tricuspid. . There is moderate thickening and moderate calcification of the aortic valve. Aortic valve regurgitation is not visualized. Mild aortic valve sclerosis is present, with no evidence of aortic valve stenosis. There is moderate thickening of the aortic valve. There is moderate calcification of the aortic valve. Pulmonic Valve: The pulmonic valve was grossly normal. Pulmonic valve regurgitation is trivial. No evidence of pulmonic stenosis. Aorta: The aortic root and ascending aorta are structurally normal, with no evidence of dilitation. Venous: The inferior vena cava is dilated in size with less than 50% respiratory variability, suggesting right atrial pressure of 15 mmHg. IAS/Shunts: There is left bowing of the interatrial septum, suggestive of elevated right atrial pressure. The atrial septum is grossly normal. Additional Comments: There is a small pleural effusion in the left lateral region.  LEFT VENTRICLE PLAX 2D LVIDd:         4.10 cm LVIDs:         2.95 cm LV PW:         1.20 cm LV IVS:        1.20 cm LVOT diam:     2.30 cm LV SV:         38 LV SV Index:   17 LVOT Area:     4.15 cm  LEFT ATRIUM             Index       RIGHT ATRIUM           Index LA diam:  3.40 cm 1.56 cm/m  RA Area:     42.60 cm LA Vol (A2C):   59.4 ml 27.33 ml/m RA Volume:   178.00 ml 81.90 ml/m LA Vol (A4C):   59.8 ml 27.52 ml/m LA Biplane Vol: 65.6 ml 30.18 ml/m  AORTIC VALVE LVOT Vmax:   52.00 cm/s LVOT Vmean:  33.600 cm/s LVOT VTI:    0.091 m  AORTA Ao Root diam: 3.60 cm TRICUSPID VALVE TR Peak grad:   29.4 mmHg TR Vmax:        271.00 cm/s  SHUNTS Systemic VTI:   0.09 m Systemic Diam: 2.30 cm Eleonore Chiquito MD Electronically signed by Eleonore Chiquito MD Signature Date/Time: 04/19/2020/12:02:30 PM    Final    { Assessment and Plan:   Cardiopulmonary Arrest - Admitted with respiratory arrest leading to cardiopulmonary arrest requiring CPR. Patient achieved ROSC after bystander CPR and about 5 minutes of ACLS by EMS (received Epi x5 and Bicarb x1). Went into V. Fib after last dose of Epi and Bicarb and was defibrillated in the ED. She was intubated in the ED and underwent TTM 36 degrees. - EKG shows atrial fibrillation with known RBBB but no acute ST/T changes.  - BNP elevated in the 900's. CT concerning for bilaterally infiltrates.  - Echo showed LVEF of 55-60% with normal wall motion. Interventricular septum is flattened in systolic and diastole consistent with RV pressure and volume overload. RV is severely enlarged with severely reduced systolic function. Mildly elevated PASP of 44.4 mmHg.  - One dose of IV Lasix 175m given with little response and worsening renal function. Nephrology has been consulted.  - Still intubated and being cooled. On Keppra. - Currently felt to be respiratory driven. I do not think we have much to add at this point. If patient has meaningful recovery, could consider right heart catheterization (left heart catheterization as well if renal function will allow).   Acute on Chronic Right Heart Failure - BNP elevated in the 90's.  - Chest CT concerning for bilateral infiltrates with possible superimposed edema.  - Echo as above.  - Given one dose of IV Lasix 1279mwith little response and worsening renal function. - Nephrology has been consulted - will defer additional Lasix and management of volume status with possible CRRT to them. - If patient recovers, may benefit from [facility] above.  Atrial Fibrillation with RVR - Currently in atrial fibrillation with baseline rates in the 110's to 120's but as high as 140's to 150's.  -  Diltiazem stopped due to hypotension.  - Can consider starting IV Amiodarone.  - On Eliquis at home and has not been started on Heparin. There were reports of possible hemoptysis prior to admission.  Shock - Septic vs Cardiogenic - Lactic acid initially 11.0 but trending down. Still elevated at 6.4 this morning.  - Currently on Levophed. - Also being treated with Cefepime.  - Management per PCCM.  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 Was 1.94 on last check on 03/28/2020.  - Continuing to trend up 3.56 >> 3.86. BUN 64. - Nephrology consulted.   Otherwise, per primary team. - Chronic thrombocytopenia: Plts 58 today. 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 Currently held due to change in renal function and elevated INR.   For questions or updates, please contact [facility]  Please consult [email or web address] for contact info under     Signed, Darreld Mclean, PA-C  04/19/2020 1:52 PM

## 2020-04-19 NOTE — Progress Notes (Signed)
LTM maintenance completed; checked under Fp1, Fp2, and fixed F3 and O1; no skin breakdown was seen.

## 2020-04-19 NOTE — Procedures (Signed)
Proctored Bethann Berkshire, MD for hemodialysis catheter insertion.    Noe Gens, MSN, NP-C [facility] 04/19/2020, 4:36 PM   Please see [email or web address] for pager details.

## 2020-04-19 NOTE — Consult Note (Signed)
[facility] Renal Consultation Note    Indication for Consultation:  AKI on CKD with oliguria, CRRT?  VZD:GLOVFI, Lennette Bihari, MD  HPI: Tracy [name] is a 59 y.o. female with a past medical history significant for HFpEF (right-sided), DM, HTN, HLD, progressive CKD, CVA, COPD on O2 at home, DVT (on anticoagulation) who presented after witnessed cardiorespiratory arrest 04/14/2020. Was reportedly pulseless for 24 minutes before ROSC was achieved. Per EMS, patient went into vfib and was defibrillated. She was encephalopathic on presentation to ED with acute hypoxic hypercapnic respiratory failure due to suspected cardiogenic pulmonary edema. Patient reportedly feeling unwell a few days prior to cardiorespiratory arrest.  Currently patient is sedated with fentanyl and intubated with mechanical ventilation and is undergoing TTM. Creatinine on admission was 3.5 and up to 3.8 today with estimated baseline 1.3-1.9. Being worked up for myoclonus. Keppra started overnight. EEG showed profound diffuse encephalopathy without specific etiology. Blood pressures soft but improving, MAPs good. Nursing decreased Levo to 3 via peripheral IV during examination. Trying to wean FiO2 as able, currently 70%. Diffuse peripheral edema present. Patient received 120 mg IV furosemide yesterday with little change in UOP. 140 mL low quality urine produced yesterday, 18 mL by 1400 today. Phosphorus up to 7.1. K and CO2 stable. Albumin 1.9. Was acidotic to 7.054 on ABG. On bicarb drip presently.   Spoke with daughter Tracy [name]. She lives a few minutes down the road from her mother, Tracy [name] in [place]. The patient lives alone but after last admission has required 24 hr care. She was seen outpatient once since discharge by [facility]. Tracy [name] denies any recent NSAID use by her mother or recent contrasted scans. No ACEi/ARB. Family history of ESRD requiring transplant in patient's aunt. Patient on  Lasix at home but persistent edema and weeping of extremities. Patient has only been aware of her kidney failure since admission at [facility] in February 2021. Patient was more recently admitted on 03/07/20 with volume overload and AKI on CKD likely cardiorenal syndrome with creatinine elevated to 2.8. Now wheelchair bound with around-the-clock care. Tracy [name] describes the patient's new baseline since February as drowsy and somewhat confused.        Past Medical History:  Diagnosis Date  . Acute ischemic stroke ([place]) 07/03/2012  . Crack cocaine use   . Diabetes ([place])   . Diabetes mellitus ([place])   . DVT (deep venous thrombosis) ([place])   . Hemiparesis, acute ([place]) 07/04/2012  . Hypertension   . LVH (left ventricular hypertrophy) 07/04/2012   Ejection fraction 60%.  . Myocardial infarct, old   . Thrombocytopenia ([place]) 07/03/2012  . Tobacco abuse    Past Surgical History:  Procedure Laterality Date  . ABDOMINAL HYSTERECTOMY    . HERNIA REPAIR     Family History  Problem Relation Age of Onset  . Heart disease Mother   . Diabetes Mother   . Stroke Mother   . Hypertension Mother   . Hypertension Father   . Heart disease Father   . Diabetes Brother    Social History:  reports that she has been smoking cigarettes. She has been smoking about 1.00 pack per day. She has never used smokeless tobacco. She reports that she does not drink alcohol or use drugs. No Known Allergies Prior to Admission medications   Medication Sig Start Date End Date Taking? Authorizing Provider  acetaminophen (TYLENOL) 325 MG tablet Take 2 tablets (650 mg total) by mouth every 6 (six) hours as needed for mild  pain (or Fever >/= 101). 02/21/20  Yes Emokpae, Courage, MD  albuterol (PROVENTIL) (2.5 MG/3ML) 0.083% nebulizer solution Take 3 mLs (2.5 mg total) by nebulization every 4 (four) hours as needed for wheezing or shortness of breath. 02/21/20  Yes Emokpae, Courage, MD  albuterol (VENTOLIN HFA) 108 (90 Base) MCG/ACT  inhaler Inhale 2 puffs into the lungs every 4 (four) hours as needed for wheezing or shortness of breath. 02/21/20  Yes Emokpae, Courage, MD  apixaban (ELIQUIS) 5 MG TABS tablet Take 1 tablet (5 mg total) by mouth 2 (two) times daily. 04/14/20 05/14/20 Yes Herminio Commons, MD  atorvastatin (LIPITOR) 10 MG tablet Take 1 tablet (10 mg total) by mouth every evening. 04/14/20  Yes Herminio Commons, MD  budesonide-formoterol (SYMBICORT) 160-4.5 MCG/ACT inhaler Inhale 2 puffs into the lungs 2 (two) times daily. 02/21/20  Yes Emokpae, Courage, MD  cholecalciferol (VITAMIN D3) 25 MCG (1000 UNIT) tablet Take 1,000 Units by mouth daily.   Yes [provider]  diltiazem (CARDIZEM CD) 120 MG 24 hr capsule Take 1 capsule (120 mg total) by mouth daily. 04/14/20 05/14/20 Yes Herminio Commons, MD  furosemide (LASIX) 40 MG tablet Take 1.5 tablets (60 mg total) by mouth daily. 04/14/20  Yes Herminio Commons, MD  HYDROcodone-acetaminophen (NORCO) 7.5-325 MG tablet One tablet as needed every six hours for pain. 04/06/20  Yes Sanjuana Kava, MD  linagliptin (TRADJENTA) 5 MG TABS tablet Take 1 tablet (5 mg total) by mouth daily. 03/20/20  Yes Johnson, Clanford L, MD  mirtazapine (REMERON) 7.5 MG tablet Take 7.5 mg by mouth at bedtime. 03/24/20  Yes [provider]   Current Facility-Administered Medications  Medication Dose Route Frequency Provider Last Rate Last Admin  . 0.9 %  sodium chloride infusion  250 mL Intravenous Continuous Agarwala, Ravi, MD      . budesonide (PULMICORT) nebulizer solution 0.5 mg  0.5 mg Nebulization BID Jose Persia, MD   0.5 mg at 04/19/20 0734  . ceFEPIme (MAXIPIME) 2 g in sodium chloride 0.9 % 100 mL IVPB  2 g Intravenous Q24H Jose Persia, MD 200 mL/hr at 04/19/20 1331 2 g at 04/19/20 1331  . chlorhexidine gluconate (MEDLINE KIT) (PERIDEX) 0.12 % solution 15 mL  15 mL Mouth Rinse BID Kipp Brood, MD   15 mL at 04/19/20 0845  . Chlorhexidine Gluconate Cloth  2 % PADS 6 each  6 each Topical Daily Agarwala, Ravi, MD      . docusate sodium (COLACE) capsule 100 mg  100 mg Oral BID PRN Neva Seat, MD      . fentaNYL 2562mg in NS 2550m(1061mml) infusion-PREMIX  0-400 mcg/hr Intravenous Continuous MelNeva SeatD 40 mL/hr at 04/19/20 0700 400 mcg/hr at 04/19/20 0700  . insulin aspart (novoLOG) injection 3-9 Units  3-9 Units Subcutaneous Q4H BasJose PersiaD   3 Units at 04/19/20 1241  . ipratropium-albuterol (DUONEB) 0.5-2.5 (3) MG/3ML nebulizer solution 3 mL  3 mL Nebulization Q6H Santos-Sanchez, Idalys, MD      . levETIRAcetam (KEPPRA) IVPB 500 mg/100 mL premix  500 mg Intravenous Q12H SomAnders SimmondsD      . MEDLINE mouth rinse  15 mL Mouth Rinse 10 times per day AgaKipp BroodD   15 mL at 04/19/20 1241  . midazolam (VERSED) injection 1 mg  1 mg Intravenous Q1H PRN SomAnders SimmondsD   1 mg at 04/19/20 [id number] norepinephrine (LEVOPHED) '4mg'$  in 250m37memix infusion  2-10 mcg/min Intravenous Titrated  Kipp Brood, MD 18.75 mL/hr at 04/19/20 0700 5 mcg/min at 04/19/20 0700  . pantoprazole (PROTONIX) injection 40 mg  40 mg Intravenous QHS Neva Seat, MD   40 mg at 04/23/2020 2243  . polyethylene glycol (MIRALAX / GLYCOLAX) packet 17 g  17 g Oral Daily PRN Neva Seat, MD      . polyvinyl alcohol (LIQUIFILM TEARS) 1.4 % ophthalmic solution 1 drop  1 drop Both Eyes PRN Kipp Brood, MD       Labs: Basic Metabolic Panel: Recent Labs  Lab 04/22/2020 1138 04/22/2020 1138 04/19/2020 1404 04/22/2020 1444 04/19/20 0313 04/19/20 0508 04/19/20 1307  NA 135   < > 137   < > 137 137 138  K 4.5   < > 4.5   < > 4.1 4.3 4.1  CL 105  --  103  --  100  --   --   CO2  --   --  15*  --  23  --   --   GLUCOSE 120*  --  191*  --  211*  --   --   BUN 64*  --  65*  --  64*  --   --   CREATININE 3.50*  --  3.56*  --  3.86*  --   --   CALCIUM  --   --  8.0*  --  8.0*  --   --   PHOS  --   --   --   --  7.1*  --   --    < > = values in  this interval not displayed.   Liver Function Tests: Recent Labs  Lab 04/22/2020 1404 04/19/20 0313 04/19/20 0815  AST 34 45*  --   ALT 28 32  --   ALKPHOS 184* 184*  --   BILITOT 7.4* 7.4* 7.4*  PROT 5.3* 5.6*  --   ALBUMIN 1.9* 1.9*  --    No results for input(s): LIPASE, AMYLASE in the last 168 hours. No results for input(s): AMMONIA in the last 168 hours. CBC: Recent Labs  Lab 04/03/2020 1044 04/07/2020 1058 04/19/20 0313 04/19/20 0508 04/19/20 1307  WBC 18.1*  --  16.4*  --   --   NEUTROABS 13.5*  --   --   --   --   HGB 11.6*   < > 11.1* 13.6 13.9  HCT 42.4   < > 39.3 40.0 41.0  MCV 116.2*  --  110.7*  --   --   PLT 74*  --  58*  --   --    < > = values in this interval not displayed.   Cardiac Enzymes: No results for input(s): CKTOTAL, CKMB, CKMBINDEX, TROPONINI in the last 168 hours. CBG: Recent Labs  Lab 04/09/2020 1924 04/08/2020 2356 04/19/20 0415 04/19/20 0757 04/19/20 1142  GLUCAP 183* 198* 196* 161* 150*   Iron Studies: No results for input(s): IRON, TIBC, TRANSFERRIN, FERRITIN in the last 72 hours. Studies/Results: CT HEAD WO CONTRAST  Result Date: 04/21/2020 CLINICAL DATA:  Altered mental status. EXAM: CT HEAD WITHOUT CONTRAST TECHNIQUE: Contiguous axial images were obtained from the base of the skull through the vertex without intravenous contrast. COMPARISON:  March 13, 2020. FINDINGS: Brain: Mild diffuse cortical atrophy is noted. Mild chronic ischemic white matter disease is noted. No mass effect or midline shift is noted. Ventricular size is within normal limits. There is no evidence of mass lesion, hemorrhage or acute infarction. Vascular: No hyperdense vessel or unexpected  calcification. Skull: Normal. Negative for fracture or focal lesion. Sinuses/Orbits: No acute finding. Other: None. IMPRESSION: Mild diffuse cortical atrophy. Mild chronic ischemic white matter disease. No acute intracranial abnormality seen. Electronically Signed   By: Marijo Conception  M.D.   On: 04/08/2020 14:46   CT Chest Wo Contrast  Result Date: 04/27/2020 CLINICAL DATA:  Hemoptysis. EXAM: CT CHEST WITHOUT CONTRAST TECHNIQUE: Multidetector CT imaging of the chest was performed following the standard protocol without IV contrast. COMPARISON:  03/21/2015. Portable chest obtained earlier today. FINDINGS: Cardiovascular: Stable enlarged heart. Small pericardial effusion with a maximum thickness of 1.2 cm, not previously present. Atheromatous calcifications, including the coronary arteries and aorta. Mediastinum/Nodes: Endotracheal tube tip at the top of the carina. Feeding tube extending into the stomach. Multiple bilateral thyroid nodules. The largest is on the right measuring 2.5 cm in maximum diameter. Enlarged precarinal node with a short axis diameter of 21 mm, previously 19 mm. No other enlarged lymph nodes are seen. Lungs/Pleura: Small bilateral pleural effusions. Dense patchy opacity and consolidation in the posterior half of the left lung involving the left lower lobe and left upper lobe. Milder patchy opacity in the right lung, primarily in the dependent right lower lobe, with underlying cylindrical bronchiectasis. Mild patchy ground-glass opacity in both lungs. Upper Abdomen: Small amount of free peritoneal fluid. Musculoskeletal: Thoracic spine degenerative changes. IMPRESSION: 1. Dense patchy opacity and consolidation in the posterior half of the left lung involving the left lower lobe and left upper lobe, compatible with pneumonia and atelectasis. 2. Milder patchy opacity in the right lung, primarily in the dependent right lower lobe, with underlying cylindrical bronchiectasis. This could be chronic or represent some superimposed infection. 3. Mild patchy ground-glass opacity in both lungs, compatible with mild interstitial pulmonary edema/inflammation. 4. Small bilateral pleural effusions. 5. Small amount of free peritoneal fluid. 6. Multiple bilateral thyroid nodules, largest  on the right measuring 2.5 cm in maximum diameter. Recommend thyroid US (ref: J Am Coll Radiol. 2015 Feb;12(2): 143-50). 7. Calcific coronary artery and aortic atherosclerosis. Aortic Atherosclerosis (ICD10-I70.0). Electronically Signed   By: Claudie Revering M.D.   On: 04/14/2020 14:51   DG Chest Port 1 View  Result Date: 04/19/2020 CLINICAL DATA:  Cardiac arrest. EXAM: PORTABLE CHEST 1 VIEW COMPARISON:  04/21/2020 FINDINGS: The endotracheal tube is 2.4 cm above the carina. The NG tube is coursing down the esophagus and into the stomach. External pacer paddles are again noted. Stable cardiac enlargement and age advanced calcification of the thoracic aorta. Slight worsening lung aeration probably in part due to lower lung volumes. Persistent interstitial and airspace process without large pleural effusions. IMPRESSION: 1. The endotracheal tube is 2.4 cm above the carina. 2. NG tube in good position. 3. Slight worsening lung aeration. Electronically Signed   By: Marijo Sanes M.D.   On: 04/19/2020 08:47   DG Chest Port 1 View  Result Date: 04/03/2020 CLINICAL DATA:  Endotracheal tube placement.  Post CPR. EXAM: PORTABLE CHEST 1 VIEW COMPARISON:  Chest x-ray dated March 13, 2020. FINDINGS: Endotracheal tube tip in good position approximately 2.8 cm above the carina. Enteric tube tip just within the stomach with the proximal side port in the lower esophagus. Chronic cardiomegaly. Pulmonary vascular congestion. Hazy perihilar opacities with consolidation in the left upper and lower lobes. Scattered scarring in the right lung. No pneumothorax or large pleural effusion. No acute osseous abnormality. IMPRESSION: 1. Appropriately positioned endotracheal tube. 2. Enteric tube tip just within the stomach with the proximal  side port in the lower esophagus. Recommend advancement. 3. Hazy perihilar opacities with consolidation in the left upper and lower lobes likely reflecting a degree of pulmonary edema with superimposed  pneumonia in the left lung not excluded. Electronically Signed   By: Titus Dubin M.D.   On: 04/15/2020 11:26   EEG adult  Result Date: 04/19/2020 Lora Havens, MD     04/19/2020  8:42 AM Patient Name: HUBERT DERSTINE MRN: [id number] Epilepsy Attending: Lora Havens Referring Physician/Provider: Dr Neva Seat Date: 04/17/2020 Duration: 20.33 mins Patient history: 59yo F s/p cardiac arrest on TTM. EEG to evaluate for seizure Level of alertness: comatose AEDs during EEG study: LEV Technical aspects: This EEG study was done with scalp electrodes positioned according to the 10-20 International system of electrode placement. Electrical activity was acquired at a sampling rate of '500Hz'$  and reviewed with a high frequency filter of '70Hz'$  and a low frequency filter of '1Hz'$ . EEG data were recorded continuously and digitally stored. DESCRIPTION: EEG showed continuous generalized background attenuation. Reactivity was not tested during this study. Hyperventilation and photic stimulation were not performed. ABNORMALITY - Background attenuation, generalized IMPRESSION: This study is suggestive of profound diffuse encephalopathy, non specific to etiology. No seizures or epileptiform discharges were seen throughout the recording. Priyanka Barbra Sarks   Overnight EEG with video  Result Date: 04/19/2020 Lora Havens, MD     04/19/2020  8:52 AM Patient Name: JUSTINE DINES MRN: [id number] Epilepsy Attending: Lora Havens Referring Physician/Provider: Dr Neva Seat Duration: 04/09/2020 2050 to 04/19/2020 0845  Patient history: 59yo F s/p cardiac arrest on TTM. EEG to evaluate for seizure  Level of alertness: comatose  AEDs during EEG study: LEV  Technical aspects: This EEG study was done with scalp electrodes positioned according to the 10-20 International system of electrode placement. Electrical activity was acquired at a sampling rate of '500Hz'$  and reviewed with a high frequency filter of  '70Hz'$  and a low frequency filter of '1Hz'$ . EEG data were recorded continuously and digitally stored.  DESCRIPTION: EEG showed continuous generalized background attenuation. After around 0445 on 04/19/2020, eeg showed intermittent generalized spikes.  Reactivity was not tested during this study. Hyperventilation and photic stimulation were not performed.  ABNORMALITY - Background attenuation, generalized  IMPRESSION: This study is suggestive of profound diffuse encephalopathy, non specific to etiology. No seizures or epileptiform discharges were seen throughout the recording. Lora Havens   ECHOCARDIOGRAM COMPLETE  Result Date: 04/19/2020    ECHOCARDIOGRAM REPORT   Patient Name:   AMARILYS LYLES Date of Exam: 04/19/2020 Medical Rec #:  [id number]             Height:       65.0 in Accession #:    [id number]            Weight:       249.6 lb Date of Birth:  10/20/1961            BSA:          2.173 m Patient Age:    46 years              BP:           119/74 mmHg Patient Gender: F                     HR:           102 bpm. Exam Location:  Inpatient Procedure: 2D Echo, Cardiac Doppler  and Color Doppler Indications:    Cardiac arrest  History:        Patient has prior history of Echocardiogram examinations, most                 recent 02/16/2020. Previous Myocardial Infarction, Stroke; Risk                 Factors:Dyslipidemia and Current Smoker. Substanace abuse.  Sonographer:    Mikki Santee RDCS (AE) Referring Phys: [place]  1. Left ventricular ejection fraction, by estimation, is 55 to 60%. The left ventricle has normal function. The left ventricle has no regional wall motion abnormalities. There is mild concentric left ventricular hypertrophy. Left ventricular diastolic function could not be evaluated. There is the interventricular septum is flattened in systole and diastole, consistent with right ventricular pressure and volume overload.  2. RVSP underestimated in setting of  severe TR. Right ventricular systolic function is severely reduced. The right ventricular size is severely enlarged. There is mildly elevated pulmonary artery systolic pressure. The estimated right ventricular systolic pressure is 23.5 mmHg.  3. Right atrial size was severely dilated.  4. Moderate pericardial effusion. The pericardial effusion is posterior to the left ventricle.  5. The mitral valve is grossly normal. Trivial mitral valve regurgitation. No evidence of mitral stenosis.  6. Tricuspid valve regurgitation is severe.  7. The aortic valve is tricuspid. Aortic valve regurgitation is not visualized. Mild aortic valve sclerosis is present, with no evidence of aortic valve stenosis.  8. The inferior vena cava is dilated in size with <50% respiratory variability, suggesting right atrial pressure of 15 mmHg. Comparison(s): Changes from prior study are noted. Moderate pericardial effusion now present. Severe RV dilation and severely reduced RV function remains. Conclusion(s)/Recommendation(s): Findings consistent with Cor Pulmonale. FINDINGS  Left Ventricle: Left ventricular ejection fraction, by estimation, is 55 to 60%. The left ventricle has normal function. The left ventricle has no regional wall motion abnormalities. The left ventricular internal cavity size was small. There is mild concentric left ventricular hypertrophy. The interventricular septum is flattened in systole and diastole, consistent with right ventricular pressure and volume overload. Left ventricular diastolic function could not be evaluated due to atrial fibrillation. Left ventricular diastolic function could not be evaluated. Right Ventricle: RVSP underestimated in setting of severe TR. The right ventricular size is severely enlarged. No increase in right ventricular wall thickness. Right ventricular systolic function is severely reduced. There is mildly elevated pulmonary artery systolic pressure. The tricuspid regurgitant velocity is  2.71 m/s, and with an assumed right atrial pressure of 15 mmHg, the estimated right ventricular systolic pressure is 36.1 mmHg. Left Atrium: Left atrial size was normal in size. Right Atrium: Right atrial size was severely dilated. Pericardium: A moderately sized pericardial effusion is present. The pericardial effusion is posterior to the left ventricle. Presence of pericardial fat pad. Mitral Valve: The mitral valve is grossly normal. Trivial mitral valve regurgitation. No evidence of mitral valve stenosis. Tricuspid Valve: The tricuspid valve is grossly normal. Tricuspid valve regurgitation is severe. No evidence of tricuspid stenosis. The flow in the hepatic veins is reversed during ventricular systole. Aortic Valve: The aortic valve is tricuspid. . There is moderate thickening and moderate calcification of the aortic valve. Aortic valve regurgitation is not visualized. Mild aortic valve sclerosis is present, with no evidence of aortic valve stenosis. There is moderate thickening of the aortic valve. There is moderate calcification of the aortic valve. Pulmonic Valve: The pulmonic valve was  grossly normal. Pulmonic valve regurgitation is trivial. No evidence of pulmonic stenosis. Aorta: The aortic root and ascending aorta are structurally normal, with no evidence of dilitation. Venous: The inferior vena cava is dilated in size with less than 50% respiratory variability, suggesting right atrial pressure of 15 mmHg. IAS/Shunts: There is left bowing of the interatrial septum, suggestive of elevated right atrial pressure. The atrial septum is grossly normal. Additional Comments: There is a small pleural effusion in the left lateral region.  LEFT VENTRICLE PLAX 2D LVIDd:         4.10 cm LVIDs:         2.95 cm LV PW:         1.20 cm LV IVS:        1.20 cm LVOT diam:     2.30 cm LV SV:         38 LV SV Index:   17 LVOT Area:     4.15 cm  LEFT ATRIUM             Index       RIGHT ATRIUM           Index LA diam:         3.40 cm 1.56 cm/m  RA Area:     42.60 cm LA Vol (A2C):   59.4 ml 27.33 ml/m RA Volume:   178.00 ml 81.90 ml/m LA Vol (A4C):   59.8 ml 27.52 ml/m LA Biplane Vol: 65.6 ml 30.18 ml/m  AORTIC VALVE LVOT Vmax:   52.00 cm/s LVOT Vmean:  33.600 cm/s LVOT VTI:    0.091 m  AORTA Ao Root diam: 3.60 cm TRICUSPID VALVE TR Peak grad:   29.4 mmHg TR Vmax:        271.00 cm/s  SHUNTS Systemic VTI:  0.09 m Systemic Diam: 2.30 cm Eleonore Chiquito MD Electronically signed by Eleonore Chiquito MD Signature Date/Time: 04/19/2020/12:02:30 PM    Final    Physical Exam: Vitals:   04/19/20 1230 04/19/20 1245 04/19/20 1300 04/19/20 1330  BP: (!) 123/93 (!) 120/95  (!) 107/91  Pulse:   75   Resp: '11 16 13 12  '$ Temp:      TempSrc:      SpO2:   (!) 67%   Weight:      Height:         General: Obese female, unresponsive and intubated.   Head: Scleral icterus present.  Neck: Supple. No lymphadenopathy. Lungs: Difficult to assess lung sounds with pad placement, some diffuse wheezing. On mechanical ventilation with some respiratory distress.  Heart: Irregular rhythm. Tachycardic.   Abdomen: Very distended, swelling into the mons pubis. BS + M/S:  Equal strength b/l in upper and lower extremities.  Extremities: Skin cool, somewhat jaundiced. 1+ edema to BL UE, 2+ edema to BL LE, lower legs wrapped due to weeping. Neuro: Sedated.   Psych:  Sedated.   Assessment/Plan: Patient is a 59 y.o. female with PMH of right-sided HF, HTN, DM, progressive CKD, COPD, and Afib with multiple recent admissions for volume overload/AKI who presented to [facility] 04/05/2020 following cardiopulmonary arrest. She was encephalopathic with hypoxic hypercapnic respiratory failure requiring intubation with mechanical ventilation. Cr 3.5 on admission. Estimated baseline Cr 1.3-1.9. Receiving care in the cardiac ICU. Nephrology consulted for AKI and oliguria to discuss necessity of CRRT.   1.  AKI on CKD III-IV, oliguric, likely CRS compounded by ischemic ATN in  the setting of recent cardiac arrest, hypotension, and paroxysmal atrial fibrillation -  Patient's cardiopulmonary arrest yesterday lasted approximately 24 minutes likely the source of worsened kidney function. Reported incidence of vfib with defibrillation by EMS. - Significant anasarca present, she was given 120 mg IV Lasix yesterday with no rise in UOP (140 mL yesterday, 18 mL today by 1400).  - Patient received bicarb drip which corrected her respiratory acidosis. K at 4.1 Phos elevated to 7.1. Other electrolytes normal. No source of intoxication. Likely uremia.  - Past discussion noted with patient regarding her declining health and questionable fitness for dialysis in the future.  PLAN: - Given this patient's current likely uremic and oliguric state, recommend starting continuous renal replacement therapy (CRRT) in ICU. Nephrology attending to place orders. Planned start today after placement of R IJ catheter.  - Will continue to monitor renal function and reassess volume status.  - Prognosis uncertain, but will support patient from a renal standpoint.   2. Acute on chronic R-sided HF - Hold repeat Lasix given plan of CRRT.  - Cardiology consulted. Deferred volume status management to nephrology. Will monitor volume status closely.   3. Paroxsymal A-Fib - On diltiazem and eliquis at home. Being held currently. Remaining in afib thus far this admission.  - Cardiology consulted. May consider starting IV amiodarone.   4. T2DM  - Per primary team    The nephrology team will continue following the care of this patient of Tracy [name]. Thank you for the consult. Please contact nephrology attending, Dr. Pearson Grippe, with questions.    Marisa Sprinkles, PA-S2 [facility]

## 2020-04-19 NOTE — Progress Notes (Signed)
Pharmacy Antibiotic Note  Tracy [name] is a 59 y.o. female admitted on 04/15/2020 as a post cardiac arrest with concern for infection from unknown source.  Pharmacy has been consulted for Cefepime and vancomycin dosing. LA elevated at 11. Pt is hypothermic. CXR read concerning for pneumonia. SCr acutely elevated at 3.5 (BL ~ 1.3-1.5)   Patient started on CRRT  Plan: -Increase Cefepime 2 gm IV Q 12 hours for CRRT dosing  -Monitor CBC, renal fx/dialysis, cultures and clinical progress   Height: 5\' 5"  (165.1 cm) Weight: 113.2 kg (249 lb 9 oz) IBW/kg (Calculated) : 57  Temp (24hrs), Avg:97.2 F (36.2 C), Min:97.2 F (36.2 C), Max:97.3 F (36.3 C)  Recent Labs  Lab 04/26/2020 1044 04/08/2020 1138 04/01/2020 1404 04/03/2020 1946 04/14/2020 2303 04/19/20 0313 04/19/20 0815  WBC 18.1*  --   --   --   --  16.4*  --   CREATININE  --  3.50* 3.56*  --   --  3.86*  --   LATICACIDVEN 11.0*  --  7.6* 8.7* 6.9*  --  6.4*    Estimated Creatinine Clearance: 19.7 mL/min (A) (by C-G formula based on SCr of 3.86 mg/dL (H)).    No Known Allergies    Thank you for allowing pharmacy to be a part of this patient's care. Alanda Slim, PharmD, [facility] Clinical Pharmacist Please see AMION for all Pharmacists' Contact Phone Numbers 04/19/2020, 4:06 PM

## 2020-04-19 NOTE — Progress Notes (Signed)
Orthopedic Tech Progress Note Patient Details:  Tracy [name] 11-14-1961 [id number]  Ortho Devices Type of Ortho Device: Haematologist Ortho Device/Splint Location: (B) LE Ortho Device/Splint Interventions: Ordered, Application   Post Interventions Patient Tolerated: Well Instructions Provided: Care of device   Braulio Bosch 04/19/2020, 12:35 PM

## 2020-04-19 NOTE — Progress Notes (Signed)
[place] Progress Note Patient Name: Tracy [name] DOB: 14-Mar-1961 MRN: [id number]   Date of Service  04/19/2020  HPI/Events of Note  Nursing reports seizure like activity vs anoxic myoclonus. Not witnessed by me. Continuous EEG in place.   eICU Interventions  Will order: 1. Keppra 1000 mg IV now, then Keppra 500 mg IV Q 12 hours.  2. Versed 1 mg IV Q 1 hour PRN anoxic myoclonus or seizures.     Intervention Category Major Interventions: Seizures - evaluation and management  Rmoni Keplinger [place] 04/19/2020, 5:09 AM

## 2020-04-19 NOTE — Progress Notes (Addendum)
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 Recently admited for A/C heart failue in Feb and March of 2021 due to fluid and dietary indiscretion.   History of present illness    Not fully recovered physically after recent admission. Reported SOB. Hemoptysis yesterday x1 and x2-3 today. Was about to go see her orthopedic doctor when she slumped to chair at home, move to floor and 911 was called received bystander CPR for several minutes; pulse represent so this was stopped for about 1 minute during which EMS arrive and found her pulseless and apneic. ACLS for about 5 min. EPI x5, BICARB x1, Defib x1. King airway in the field, intubated in ED. Suspected to be respiratory driven with COPD and suspected OSA, worsening RHF.   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 Dense patchy opacity and consolidation in the posterior half of the left lung involving the left lower lobe and left upper lobe, compatible with pneumonia and atelectasis. 2. Milder patchy opacity in the right lung, primarily in the dependent right lower lobe, with underlying cylindrical bronchiectasis. This could be chronic or represent some superimposed infection. 3. Mild patchy ground-glass opacity in both lungs, compatible with mild interstitial pulmonary edema/inflammation. 4. Small bilateral pleural effusions.  5. Small amount of free peritoneal fluid. 6. Multiple bilateral thyroid nodules, largest on the right measuring 2.5 cm in maximum diameter. Recommend thyroid US (ref: J Am Coll Radiol. 2015 Feb;12(2): 143-50). 7. Calcific coronary artery and aortic atherosclerosis.  CT Head (4/20) Negative for acute intracranial abnormalities   TTE (4/21) 1. Left ventricular ejection fraction, by estimation, is 55 to 60%. The  left ventricle has normal function. The left ventricle has no regional  wall motion abnormalities. There is mild concentric left ventricular  hypertrophy. Left ventricular diastolic  function could not be evaluated. There is the interventricular septum is  flattened in systole and diastole, consistent with right ventricular  pressure and volume overload.  2. RVSP underestimated in setting of severe TR. Right ventricular  systolic function is severely reduced. The right ventricular size is  severely enlarged. There is mildly elevated pulmonary artery systolic  pressure. The estimated right ventricular  systolic pressure is 22.0 mmHg.  3. Right atrial size was severely dilated.  4. Moderate pericardial effusion. The pericardial effusion is posterior  to the left ventricle.  5. The mitral valve is grossly normal. Trivial mitral valve  regurgitation. No evidence of mitral stenosis.  6. Tricuspid valve regurgitation is severe.  7. The aortic valve is tricuspid. Aortic valve regurgitation is not  visualized. Mild aortic valve sclerosis is present, with no evidence of  aortic valve stenosis.  8. The inferior vena cava is dilated in size with <50% respiratory  variability, suggesting right atrial pressure of 15 mmHg.   Micro Data:  4/20 Blood Cx: NGTD x 24 hours 4/20 Urine Cx: GNR,  40,000  4/20 Resp Cx: Pending   Antimicrobials:  Vanc 4/20 - 4/21 Cefepime 4/20 >>  Interim history/subjective:   Overnight events include myoclonic jerks concerning for seizure versus  anoxic brain injury. She was loaded on Kepra and PRN Versed added. Additionally, ABG showed poor oxygenation so PEEP increased from 8 to 10.   This AM, patient is sedated and unresponsive to voice or noxious stimuli.   Objective   Blood pressure 97/76, pulse 94, temperature (!) 97.2 F (36.2 C), temperature source Core, resp. rate (!) 21, height 5\' 5"  (1.651 m), weight 113.2 kg, SpO2 96 %.    Vent Mode: PRVC FiO2 (%):  [100 %] 100 % Set Rate:  [20 bmp] 20 bmp Vt Set:  [450 mL] 450 mL PEEP:  [8 cmH20-14 cmH20] 10 cmH20 Plateau Pressure:  [11 cmH20-25 cmH20] 11 cmH20   Intake/Output Summary (Last 24 hours) at 04/19/2020 0643 Last data filed at 04/19/2020 0400 Gross per 24 hour  Intake 3074.28 ml  Output 140 ml  Net 2934.28 ml   Filed Weights   04/08/2020 1039 04/19/20 0600  Weight: 110 kg 113.2 kg   Physical Exam Vitals and nursing note reviewed.  Constitutional:      Appearance: She is toxic-appearing. She is not diaphoretic.     Interventions: She is intubated.     Comments: Sedated  Eyes:     General: Scleral icterus present.  Cardiovascular:     Rate and Rhythm: Tachycardia present. Rhythm irregular.     Heart sounds: No murmur.     Comments: 1+ Pitting edema in bilateral upper extremities. Lower extremity edema extends to abdomen Pulmonary:     Effort: Tachypnea and respiratory distress present. She is intubated.     Breath sounds: Examination of the left-upper field reveals wheezing. Examination of the left-middle field reveals wheezing. Decreased breath sounds and wheezing present.  Musculoskeletal:     Right lower leg: 2+ Pitting Edema present.     Left lower leg: 2+ Pitting Edema present.  Skin:    General: Skin is cool.     Findings: No erythema, lesion or rash.  Neurological:     Comments: Unresponsive to noxious stimuli    Resolved Hospital Problem list     Assessment & Plan:   Acute Hypoxic Respiratory Failure / Chronic Respiratory Failure S/P V. Fib  Cardiac Arrest:  Short duration of CPR with immediate bystander CPR initiated.  High suspicion for respiratory etiology due to COPD with likely OSA, in the setting of HFpEF with volume over load and known severe right heart failure. Will rule out pneumonia as well  Too early to evaluate long-term neurological stand point but EEG did not show any seizures. Will consider MRI to check for anoxic brain injury, although less likely given her short course of CPR. P:  - Cardiology consulted - TTM 36 degrees - Mechanical Ventilation, nor ready for SBT - VAP Protocol - Fentanyl gtt for sedation - Continue Cefepime - Procal pending  - Continue Kepra, Versed PRN  # Shock, Cardiogenic Lactic acid initially >11, but trending down. Still elevated at 6.4 this AM.  P:  - Continue Levophed to maintain MAP > 65.  - Trend lactic acid   # COPD  PFTs are not available in the chart, nor any pulmonology visits. At home, she is on 3L [place], Symbicort BID, and Albuterol PRN.  P: - Duoneb QID - Pulmicort BID  # HFpEF with severe right HF Last TTE showed in February 2021  showed EF of 55-60% with mild LVH and elevated LVEDP. Her RV function was severely reduced with moderate enlargement, as well in Progress. PA pressure was moderately elevated. Severe bi-atrial enlargement present.  TTE was evaluated today. EF is unchanged, with no LV wall motion abnormality, however she does have interventricular septum flattening, as her RV function is very severely reduced with significantly increased pressure with volume overload.  Lasix 120 mg was given yesterday, however urine output is very poor at 165 mL over 24 hours. It would likely be futile to diuresis her with Lasix again today given her renal function.  P:  - Cardiology consulted - Discuss CRRT with Nephrology for diuresis   # Acute on Chronic CKD III:  Cr 3.5 (Baseline 2). Continues to increase today with dismal urine output. Likely ATN.   P:  - Nephrology consulted,  will consider CRRT  - Monitor UOP and renal function - Replete e-lytes as need - Avoid nephrotoxic agents  # Paroxsymal A-Fib:  On Diltiazem and Eliquis at home. During this admission, she has remained in atrial fibrillation with no evidence of RVR at this time. Will hold off on Diltiazem given her soft BP. P:  - Holding home Eliquis and Diltiazem  # Mixed Hyperbilirubinemia # Elevated Transaminases (40s-60s) Possibly secondary to hepatic congestion. Direct bilirubin dominant supporting liver source.  P:  - Follow INR, LFTs, T. bili - RUQ U/S with portal pressures pending   # Chronic Thrombocytopenia (since 2009)  Down-trending since February, lowest recorded on 4/21 at 27. Low suspicion for hemolytic syndromes in light of hyperbilirubinemia has both are chronic. She did have some upper GI bleeding on admission but this has stopped.   P:  - Trend platelets - Monitor for signs of additional active bleeding  # Chronic Macrocytic Anemia  Stable and unchanged.  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 Holding given change in renal function and elevated INR # Hx of CVA: Holding home Statin  Best practice:  Diet: NPO Pain/Anxiety/Delirium protocol (if indicated): Fentanyl gtt, Versed PRN VAP protocol (if indicated): Yes DVT prophylaxis: Holding heparin GI prophylaxis: PPI Glucose control: SSI Mobility: BR Code Status: Full Family Communication: Daughter updated at bedside. Disposition: ICU  Labs   CBC: Recent Labs  Lab 04/20/2020 1044 04/23/2020 1044 04/17/2020 1058 04/15/2020 1138 04/03/2020 1444 04/19/20 0313 04/19/20 0508  WBC 18.1*  --   --   --   --  16.4*  --   NEUTROABS 13.5*  --   --   --   --   --   --   HGB 11.6*   < > 13.6 13.9 12.6 11.1* 13.6  HCT 42.4   < > 40.0 41.0 37.0 39.3 40.0  MCV 116.2*  --   --   --   --  110.7*  --   PLT 74*  --   --   --   --  58*  --    < > = values in this interval not displayed.    Basic  Metabolic Panel: Recent Labs  Lab 04/05/2020 1138 04/12/2020 1404 04/20/2020 1444 04/19/20 0313 04/19/20 0508  NA 135 137 136 137 137  K 4.5 4.5 4.0 4.1 4.3  CL 105 103  --  100  --   CO2  --  15*  --  23  --   GLUCOSE 120* 191*  --  211*  --   BUN 64* 65*  --  64*  --   CREATININE 3.50* 3.56*  --  3.86*  --   CALCIUM  --  8.0*  --  8.0*  --   MG  --   --   --  2.1  --   PHOS  --   --   --  7.1*  --    GFR: Estimated Creatinine Clearance: 19.7 mL/min (A) (by C-G formula based on SCr of 3.86 mg/dL (H)). Recent Labs  Lab 04/28/2020 1044 04/23/2020 1404 04/03/2020 1946 04/06/2020 2303 04/19/20 0313  WBC 18.1*  --   --   --  16.4*  LATICACIDVEN 11.0* 7.6* 8.7* 6.9*  --     Liver Function Tests: Recent Labs  Lab 04/08/2020 1404 04/19/20 0313  AST 34 45*  ALT 28 32  ALKPHOS 184* 184*  BILITOT 7.4* 7.4*  PROT 5.3* 5.6*  ALBUMIN 1.9* 1.9*   No results for input(s): LIPASE, AMYLASE in the last 168 hours. No results for input(s): AMMONIA in the last 168 hours.  ABG    Component Value Date/Time   PHART 7.313 (L) 04/19/2020 0508   PCO2ART 47.1 04/19/2020 0508   PO2ART 85.0 04/19/2020 0508   HCO3 24.2 04/19/2020 0508   TCO2 26 04/19/2020 0508   ACIDBASEDEF 3.0 (H) 04/19/2020 0508   O2SAT 96.0 04/19/2020 0508     Coagulation Profile: Recent Labs  Lab 04/28/2020 1044 04/21/2020 1346 04/13/2020 1946 04/19/20 0313  INR 3.6* 3.4* 2.9* 2.9*    Cardiac Enzymes: No results for input(s): CKTOTAL, CKMB, CKMBINDEX, TROPONINI in the last 168 hours.  HbA1C: Hgb A1c MFr Bld  Date/Time Value Ref Range Status  02/15/2020 01:12 PM 7.5 (H) 4.8 - 5.6 % Final    Comment:    (NOTE) Pre diabetes:          5.7%-6.4% Diabetes:              >6.4% Glycemic control for   <7.0% adults with diabetes   07/04/2012 05:23 AM 6.4 (H) <5.7 % Final    Comment:    (NOTE)                                                                       According to the ADA Clinical Practice Recommendations for  2011, when HbA1c is used as a screening test:  >=6.5%   Diagnostic of Diabetes Mellitus           (if abnormal result is confirmed) 5.7-6.4%   Increased risk of developing Diabetes Mellitus References:Diagnosis and Classification of Diabetes Mellitus,Diabetes FIEP,3295,18(ACZYS 1):S62-S69 and Standards of Medical Care in         Diabetes - 2011,Diabetes AYTK,1601,09 (Suppl 1):S11-S61.    CBG: Recent Labs  Lab 04/27/2020 1038 04/08/2020 1546 04/10/2020 1924 04/11/2020 2356 04/19/20 0415  GLUCAP 122* 204* 183* 198* 196*    Review of Systems:   Unable to assess as patient intubated and unresponsive.  Past Medical History  She,  has a past medical history of Acute ischemic stroke ([place]) (07/03/2012), Crack cocaine use, Diabetes ([place]), Diabetes mellitus ([place]), DVT (deep venous thrombosis) ([place]), Hemiparesis, acute ([place]) (07/04/2012), Hypertension, LVH (left ventricular hypertrophy) (07/04/2012), Myocardial infarct, old, Thrombocytopenia ([place]) (07/03/2012), and Tobacco abuse.   Surgical History    Past  Surgical History:  Procedure Laterality Date  . ABDOMINAL HYSTERECTOMY    . HERNIA REPAIR       Social History   reports that she has been smoking cigarettes. She has been smoking about 1.00 pack per day. She has never used smokeless tobacco. She reports that she does not drink alcohol or use drugs.   Family History   Her family history includes Diabetes in her brother and mother; Heart disease in her father and mother; Hypertension in her father and mother; Stroke in her mother.   Allergies No Known Allergies   Home Medications  Prior to Admission medications   Medication Sig Start Date End Date Taking? Authorizing Provider  acetaminophen (TYLENOL) 325 MG tablet Take 2 tablets (650 mg total) by mouth every 6 (six) hours as needed for mild pain (or Fever >/= 101). 02/21/20   Emokpae, Courage, MD  albuterol (PROVENTIL) (2.5 MG/3ML) 0.083% nebulizer solution Take 3 mLs (2.5 mg total) by  nebulization every 4 (four) hours as needed for wheezing or shortness of breath. 02/21/20   Denton Brick, Courage, MD  albuterol (VENTOLIN HFA) 108 (90 Base) MCG/ACT inhaler Inhale 2 puffs into the lungs every 4 (four) hours as needed for wheezing or shortness of breath. 02/21/20   Denton Brick, Courage, MD  apixaban (ELIQUIS) 5 MG TABS tablet Take 1 tablet (5 mg total) by mouth 2 (two) times daily. 04/14/20 05/14/20  Herminio Commons, MD  atorvastatin (LIPITOR) 10 MG tablet Take 1 tablet (10 mg total) by mouth every evening. 04/14/20   Herminio Commons, MD  budesonide-formoterol (SYMBICORT) 160-4.5 MCG/ACT inhaler Inhale 2 puffs into the lungs 2 (two) times daily. 02/21/20   Roxan Hockey, MD  cephALEXin (KEFLEX) 250 MG capsule Take 250 mg by mouth 3 (three) times daily. 03/25/20   [provider]  diltiazem (CARDIZEM CD) 120 MG 24 hr capsule Take 1 capsule (120 mg total) by mouth daily. 04/14/20 05/14/20  Herminio Commons, MD  furosemide (LASIX) 40 MG tablet Take 1.5 tablets (60 mg total) by mouth daily. 04/14/20   Herminio Commons, MD  HYDROcodone-acetaminophen (Encinal) 7.5-325 MG tablet One tablet as needed every six hours for pain. 04/06/20   Sanjuana Kava, MD  linagliptin (TRADJENTA) 5 MG TABS tablet Take 1 tablet (5 mg total) by mouth daily. 03/20/20   Johnson, Clanford L, MD  mirtazapine (REMERON) 7.5 MG tablet Take 7.5 mg by mouth at bedtime. 03/24/20   [provider]     Critical care time:    Pearson Grippe, DO IM PGY-3

## 2020-04-19 NOTE — Procedures (Signed)
Patient Name: Tracy [name]  MRN: [id number]  Epilepsy Attending: Lora Havens  Referring Physician/Provider: Dr Neva Seat Date: 04/04/2020 Duration: 20.33 mins  Patient history: 59yo F s/p cardiac arrest on TTM. EEG to evaluate for seizure  Level of alertness: comatose  AEDs during EEG study: LEV  Technical aspects: This EEG study was done with scalp electrodes positioned according to the 10-20 International system of electrode placement. Electrical activity was acquired at a sampling rate of 500Hz  and reviewed with a high frequency filter of 70Hz  and a low frequency filter of 1Hz . EEG data were recorded continuously and digitally stored.   DESCRIPTION: EEG showed continuous generalized background attenuation. Reactivity was not tested during this study. Hyperventilation and photic stimulation were not performed.  ABNORMALITY - Background attenuation, generalized  IMPRESSION: This study is suggestive of profound diffuse encephalopathy, non specific to etiology.  No seizures or epileptiform discharges were seen throughout the recording.      Saburo Luger Barbra Sarks

## 2020-04-19 NOTE — Progress Notes (Signed)
*  PRELIMINARY RESULTS* Echocardiogram 2D Echocardiogram has been performed.  04/19/2020, 11:32 AM

## 2020-04-19 NOTE — Progress Notes (Signed)
Initial Nutrition Assessment  DOCUMENTATION CODES:   Obesity unspecified  INTERVENTION:   Tube Feeding:  Initiate trickle TF of Vital High Protein at 20 ml/hr Goal rate: Vital High Protein at 60 ml/hr Provides 1440 kcals, 127 g of protein and 1210 mL of free water Goal rate meets 100% estimated calorie and protein needs   NUTRITION DIAGNOSIS:   Inadequate oral intake related to acute illness as evidenced by NPO status.  GOAL:   Patient will meet greater than or equal to 90% of their needs  MONITOR:   PO intake, Supplement acceptance, Labs, Weight trends, Skin  REASON FOR ASSESSMENT:   Ventilator    ASSESSMENT:   58 yo admitted post respiratory arrest leading to cardiac arrest with acute on chronic respiratory failure requiring intubation, cardiogenic shock, acute on chronic renal failure. PMH includes COPD, CHF, DVT, DM, HTN  4/20 Admitted, intubated, TTM 36 degrees  Patient is currently intubated on ventilator support, requiring levophed MV: 12 L/min Temp (24hrs), Avg:97.2 F (36.2 C), Min:97.2 F (36.2 C), Max:97.3 F (36.3 C)  Propofol: none   Unable to obtain diet and weight history  Pt with wounds on legs related to lymphedema  Current weight 113.2 kg; admit weight 110 kg. No weight loss trend per weight encounters. Net + 4L per I/O flow sheet  Labs: CBGs 150-161 Meds: fentanyl, levophed, sodium bicarb   Diet Order:   Diet Order            Diet NPO time specified  Diet effective now              EDUCATION NEEDS:   Not appropriate for education at this time  Skin:  Skin Assessment: Skin Integrity Issues: Skin Integrity Issues:: Other (Comment) Other: multiple wounds to bilateral LE associated with lymphedema  Last BM:  no documented BM  Height:   Ht Readings from Last 1 Encounters:  04/03/2020 5\' 5"  (1.651 m)    Weight:   Wt Readings from Last 1 Encounters:  04/19/20 113.2 kg    BMI:  Body mass index is 41.53  kg/m.  Estimated Nutritional Needs:   Kcal:  9892-1194 kcals  Protein:  114-142 g  Fluid:  >/= 1.5   Kerman Passey MS, RDN, LDN, CNSC RD Pager Number and Weekend/On-Call After Hours Pager Located in [facility]

## 2020-04-19 NOTE — Procedures (Addendum)
Patient Name: Tracy [name]  MRN: [id number]  Epilepsy Attending: Lora Havens  Referring Physician/Provider: Dr Neva Seat Duration: 04/02/2020 2050 to 04/19/2020 2050  Patient history: 59yo F s/p cardiac arrest on TTM. EEG to evaluate for seizure  Level of alertness: comatose  AEDs during EEG study: LEV  Technical aspects: This EEG study was done with scalp electrodes positioned according to the 10-20 International system of electrode placement. Electrical activity was acquired at a sampling rate of 500Hz  and reviewed with a high frequency filter of 70Hz  and a low frequency filter of 1Hz . EEG data were recorded continuously and digitally stored.   DESCRIPTION: EEG showed continuous generalized background attenuation. Reactivity was not tested during this study. Hyperventilation and photic stimulation were not performed.  ABNORMALITY - Background attenuation, generalized  IMPRESSION: This study is suggestive of profound diffuse encephalopathy, non specific to etiology.  No seizures or epileptiform discharges were seen throughout the recording.  Akeel Reffner Barbra Sarks

## 2020-04-20 DIAGNOSIS — I469 Cardiac arrest, cause unspecified: Secondary | ICD-10-CM

## 2020-04-20 DIAGNOSIS — I5023 Acute on chronic systolic (congestive) heart failure: Secondary | ICD-10-CM

## 2020-04-20 DIAGNOSIS — N179 Acute kidney failure, unspecified: Secondary | ICD-10-CM

## 2020-04-20 DIAGNOSIS — I480 Paroxysmal atrial fibrillation: Secondary | ICD-10-CM

## 2020-04-20 LAB — CBC WITH DIFFERENTIAL/PLATELET
Abs Immature Granulocytes: 0.32 10*3/uL — ABNORMAL HIGH (ref 0.00–0.07)
Abs Immature Granulocytes: 0.4 10*3/uL — ABNORMAL HIGH (ref 0.00–0.07)
Basophils Absolute: 0.1 10*3/uL (ref 0.0–0.1)
Basophils Absolute: 0 10*3/uL (ref 0.0–0.1)
Basophils Relative: 0 %
Basophils Relative: 0 %
Eosinophils Absolute: 0 10*3/uL (ref 0.0–0.5)
Eosinophils Absolute: 0 10*3/uL (ref 0.0–0.5)
Eosinophils Relative: 0 %
Eosinophils Relative: 0 %
HCT: 43.6 % (ref 36.0–46.0)
HCT: 45.2 % (ref 36.0–46.0)
Hemoglobin: 12.2 g/dL (ref 12.0–15.0)
Hemoglobin: 12.5 g/dL (ref 12.0–15.0)
Immature Granulocytes: 2 %
Lymphocytes Relative: 9 %
Lymphocytes Relative: 4 %
Lymphs Abs: 1.7 10*3/uL (ref 0.7–4.0)
Lymphs Abs: 0.9 10*3/uL (ref 0.7–4.0)
MCH: 30.7 pg (ref 26.0–34.0)
MCH: 31.3 pg (ref 26.0–34.0)
MCHC: 28 g/dL — ABNORMAL LOW (ref 30.0–36.0)
MCHC: 27.7 g/dL — ABNORMAL LOW (ref 30.0–36.0)
MCV: 109.8 fL — ABNORMAL HIGH (ref 80.0–100.0)
MCV: 113.3 fL — ABNORMAL HIGH (ref 80.0–100.0)
Metamyelocytes Relative: 1 %
Monocytes Absolute: 0.5 10*3/uL (ref 0.1–1.0)
Monocytes Absolute: 0.4 10*3/uL (ref 0.1–1.0)
Monocytes Relative: 3 %
Monocytes Relative: 2 %
Myelocytes: 1 %
Neutro Abs: 17.2 10*3/uL — ABNORMAL HIGH (ref 1.7–7.7)
Neutro Abs: 19.7 10*3/uL — ABNORMAL HIGH (ref 1.7–7.7)
Neutrophils Relative %: 86 %
Neutrophils Relative %: 92 %
Platelets: 76 10*3/uL — ABNORMAL LOW (ref 150–400)
Platelets: 80 10*3/uL — ABNORMAL LOW (ref 150–400)
RBC: 3.97 MIL/uL (ref 3.87–5.11)
RBC: 3.99 MIL/uL (ref 3.87–5.11)
RDW: 30.3 % — ABNORMAL HIGH (ref 11.5–15.5)
RDW: 30.3 % — ABNORMAL HIGH (ref 11.5–15.5)
WBC: 19.7 10*3/uL — ABNORMAL HIGH (ref 4.0–10.5)
WBC: 21.4 10*3/uL — ABNORMAL HIGH (ref 4.0–10.5)
nRBC: 4.9 % — ABNORMAL HIGH (ref 0.0–0.2)
nRBC: 10.5 % — ABNORMAL HIGH (ref 0.0–0.2)
nRBC: 13 /100 WBC — ABNORMAL HIGH

## 2020-04-20 LAB — BLOOD CULTURE ID PANEL (REFLEXED)

## 2020-04-20 LAB — POCT I-STAT 7, (LYTES, BLD GAS, ICA,H+H)
Acid-base deficit: 6 mmol/L — ABNORMAL HIGH (ref 0.0–2.0)
Bicarbonate: 19.9 mmol/L — ABNORMAL LOW (ref 20.0–28.0)
Calcium, Ion: 1.12 mmol/L — ABNORMAL LOW (ref 1.15–1.40)
HCT: 43 % (ref 36.0–46.0)
Hemoglobin: 14.6 g/dL (ref 12.0–15.0)
O2 Saturation: 98 %
Patient temperature: 36.5
Potassium: 4.2 mmol/L (ref 3.5–5.1)
Sodium: 137 mmol/L (ref 135–145)
TCO2: 21 mmol/L — ABNORMAL LOW (ref 22–32)
pCO2 arterial: 39.7 mmHg (ref 32.0–48.0)
pH, Arterial: 7.305 — ABNORMAL LOW (ref 7.350–7.450)
pO2, Arterial: 110 mmHg — ABNORMAL HIGH (ref 83.0–108.0)

## 2020-04-20 LAB — RENAL FUNCTION PANEL
Albumin: 2.2 g/dL — ABNORMAL LOW (ref 3.5–5.0)
Albumin: 2.2 g/dL — ABNORMAL LOW (ref 3.5–5.0)
Anion gap: 19 — ABNORMAL HIGH (ref 5–15)
Anion gap: 16 — ABNORMAL HIGH (ref 5–15)
BUN: 51 mg/dL — ABNORMAL HIGH (ref 6–20)
BUN: 42 mg/dL — ABNORMAL HIGH (ref 6–20)
CO2: 19 mmol/L — ABNORMAL LOW (ref 22–32)
CO2: 18 mmol/L — ABNORMAL LOW (ref 22–32)
Calcium: 8.5 mg/dL — ABNORMAL LOW (ref 8.9–10.3)
Calcium: 8.4 mg/dL — ABNORMAL LOW (ref 8.9–10.3)
Chloride: 99 mmol/L (ref 98–111)
Chloride: 100 mmol/L (ref 98–111)
Creatinine, Ser: 3.15 mg/dL — ABNORMAL HIGH (ref 0.44–1.00)
Creatinine, Ser: 2.97 mg/dL — ABNORMAL HIGH (ref 0.44–1.00)
GFR calc Af Amer: 18 mL/min — ABNORMAL LOW (ref >60)
GFR calc Af Amer: 19 mL/min — ABNORMAL LOW (ref >60)
GFR calc non Af Amer: 15 mL/min — ABNORMAL LOW (ref >60)
GFR calc non Af Amer: 17 mL/min — ABNORMAL LOW (ref >60)
Glucose, Bld: 71 mg/dL (ref 70–99)
Glucose, Bld: 290 mg/dL — ABNORMAL HIGH (ref 70–99)
Phosphorus: 6.1 mg/dL — ABNORMAL HIGH (ref 2.5–4.6)
Phosphorus: 6 mg/dL — ABNORMAL HIGH (ref 2.5–4.6)
Potassium: 4.5 mmol/L (ref 3.5–5.1)
Potassium: 4.7 mmol/L (ref 3.5–5.1)
Sodium: 137 mmol/L (ref 135–145)
Sodium: 134 mmol/L — ABNORMAL LOW (ref 135–145)

## 2020-04-20 LAB — HEPATIC FUNCTION PANEL
ALT: 38 U/L (ref 0–44)
AST: 49 U/L — ABNORMAL HIGH (ref 15–41)
Albumin: 2.2 g/dL — ABNORMAL LOW (ref 3.5–5.0)
Alkaline Phosphatase: 221 U/L — ABNORMAL HIGH (ref 38–126)
Bilirubin, Direct: 6 mg/dL — ABNORMAL HIGH (ref 0.0–0.2)
Indirect Bilirubin: 4.2 mg/dL — ABNORMAL HIGH (ref 0.3–0.9)
Total Bilirubin: 10.2 mg/dL — ABNORMAL HIGH (ref 0.3–1.2)
Total Protein: 6.4 g/dL — ABNORMAL LOW (ref 6.5–8.1)

## 2020-04-20 LAB — GLUCOSE, CAPILLARY
Glucose-Capillary: 62 mg/dL — ABNORMAL LOW (ref 70–99)
Glucose-Capillary: 78 mg/dL (ref 70–99)
Glucose-Capillary: 60 mg/dL — ABNORMAL LOW (ref 70–99)
Glucose-Capillary: 79 mg/dL (ref 70–99)
Glucose-Capillary: 63 mg/dL — ABNORMAL LOW (ref 70–99)
Glucose-Capillary: 83 mg/dL (ref 70–99)
Glucose-Capillary: 90 mg/dL (ref 70–99)
Glucose-Capillary: 107 mg/dL — ABNORMAL HIGH (ref 70–99)
Glucose-Capillary: 55 mg/dL — ABNORMAL LOW (ref 70–99)
Glucose-Capillary: 68 mg/dL — ABNORMAL LOW (ref 70–99)
Glucose-Capillary: 80 mg/dL (ref 70–99)
Glucose-Capillary: 55 mg/dL — ABNORMAL LOW (ref 70–99)
Glucose-Capillary: 79 mg/dL (ref 70–99)
Glucose-Capillary: 81 mg/dL (ref 70–99)

## 2020-04-20 LAB — LACTIC ACID, PLASMA
Lactic Acid, Venous: 7.5 mmol/L (ref 0.5–1.9)
Lactic Acid, Venous: 7.8 mmol/L (ref 0.5–1.9)
Lactic Acid, Venous: 9.2 mmol/L (ref 0.5–1.9)
Lactic Acid, Venous: 9.7 mmol/L (ref 0.5–1.9)
Lactic Acid, Venous: >11 mmol/L (ref 0.5–1.9)
Lactic Acid, Venous: >11 mmol/L (ref 0.5–1.9)

## 2020-04-20 LAB — URINE CULTURE: Culture: 40000 — AB

## 2020-04-20 LAB — PROTIME-INR
INR: 2.9 — ABNORMAL HIGH (ref 0.8–1.2)
Prothrombin Time: 30.6 seconds — ABNORMAL HIGH (ref 11.4–15.2)

## 2020-04-20 LAB — PROCALCITONIN: Procalcitonin: 13.42 ng/mL

## 2020-04-20 LAB — MAGNESIUM: Magnesium: 2.3 mg/dL (ref 1.7–2.4)

## 2020-04-20 MED ORDER — DEXTROSE 50 % IV SOLN
INTRAVENOUS | Status: AC
  Filled 2020-04-20: qty 50

## 2020-04-20 MED ORDER — DEXTROSE 5 % IV SOLN: INTRAVENOUS | Status: DC

## 2020-04-20 MED ORDER — VITAL HIGH PROTEIN PO LIQD
1000.0000 mL | ORAL | Status: DC
  Administered 2020-04-20: 1000 mL

## 2020-04-20 MED ORDER — DEXTROSE 50 % IV SOLN
12.5000 g | INTRAVENOUS | Status: AC
  Administered 2020-04-20: 12.5 g via INTRAVENOUS

## 2020-04-20 MED ORDER — ARTIFICIAL TEARS OPHTHALMIC OINT
TOPICAL_OINTMENT | OPHTHALMIC | Status: DC | PRN
  Administered 2020-04-22: 2 via OPHTHALMIC
  Filled 2020-04-20 (×2): qty 3.5

## 2020-04-20 MED ORDER — DEXTROSE 50 % IV SOLN: 12.5000 g | INTRAVENOUS | Status: AC

## 2020-04-20 MED ORDER — FENTANYL CITRATE (PF) 100 MCG/2ML IJ SOLN
50.0000 ug | INTRAMUSCULAR | Status: AC | PRN
  Administered 2020-04-20 – 2020-04-21 (×3): 50 ug via INTRAVENOUS
  Filled 2020-04-20 (×2): qty 2

## 2020-04-20 MED ORDER — FENTANYL CITRATE (PF) 100 MCG/2ML IJ SOLN
50.0000 ug | INTRAMUSCULAR | Status: DC | PRN
  Filled 2020-04-20: qty 2

## 2020-04-20 MED ORDER — DEXTROSE 50 % IV SOLN
INTRAVENOUS | Status: AC
  Administered 2020-04-20: 12.5 g via INTRAVENOUS
  Filled 2020-04-20: qty 50

## 2020-04-20 MED ORDER — DEXTROSE 10 % IV SOLN: INTRAVENOUS | Status: DC

## 2020-04-20 NOTE — Procedures (Signed)
Hemodialysis Catheter Insertion Procedure Note Tracy [name] [id number] 1961/11/12  Procedure: Insertion of Hemodialysis Catheter Indications: Dialysis Access   Procedure Details Consent: Risks of procedure as well as the alternatives and risks of each were explained to the (patient/caregiver).  Consent for procedure obtained. Time Out: Verified patient identification, verified procedure, site/side was marked, verified correct patient position, special equipment/implants available, medications/allergies/relevent history reviewed, required imaging and test results available.  Performed  Maximum sterile technique was used including antiseptics, cap, gloves, gown, hand hygiene, mask and sheet. Skin prep: Chlorhexidine; local anesthetic administered Triple lumen hemodialysis catheter was inserted into right internal jugular vein using the Seldinger technique.  Evaluation Blood flow good Complications: No apparent complications Patient did tolerate procedure well. Chest X-ray ordered to verify placement.  CXR: normal.  Dr. Jose Persia Internal Medicine PGY-1  Pager: [phone] 04/20/2020, 7:03 AM

## 2020-04-20 NOTE — Progress Notes (Signed)
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 Recently admited for A/C heart failue in Feb and March of 2021 due to fluid and dietary indiscretion.   History of present illness    Not fully recovered physically after recent admission. Reported SOB. Hemoptysis yesterday x1 and x2-3 today. Was about to go see her orthopedic doctor when she slumped to chair at home, move to floor and 911 was called received bystander CPR for several minutes; pulse represent so this was stopped for about 1 minute during which EMS arrive and found her pulseless and apneic. ACLS for about 5 min. EPI x5, BICARB x1, Defib x1. King airway in the field, intubated in ED. Suspected to be respiratory driven with COPD and suspected OSA, worsening RHF.   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 Dense patchy opacity and consolidation in the posterior half of the left lung involving the left lower lobe and left upper lobe, compatible with pneumonia and atelectasis. 2. Milder patchy opacity in the right lung, primarily in the dependent right lower lobe, with underlying cylindrical bronchiectasis. This could be chronic or represent some superimposed infection. 3. Mild patchy ground-glass opacity in both lungs, compatible with mild interstitial pulmonary edema/inflammation. 4. Small bilateral pleural  effusions. 5. Small amount of free peritoneal fluid. 6. Multiple bilateral thyroid nodules, largest on the right measuring 2.5 cm in maximum diameter. Recommend thyroid US (ref: J Am Coll Radiol. 2015 Feb;12(2): 143-50). 7. Calcific coronary artery and aortic atherosclerosis.  CT Head (4/20) Negative for acute intracranial abnormalities   TTE (4/21) 1. Left ventricular ejection fraction, by estimation, is 55 to 60%. The  left ventricle has normal function. The left ventricle has no regional  wall motion abnormalities. There is mild concentric left ventricular  hypertrophy. Left ventricular diastolic  function could not be evaluated. There is the interventricular septum is  flattened in systole and diastole, consistent with right ventricular  pressure and volume overload.  2. RVSP underestimated in setting of severe TR. Right ventricular  systolic function is severely reduced. The right ventricular size is  severely enlarged. There is mildly elevated pulmonary artery systolic  pressure. The estimated right ventricular  systolic pressure is 56.3 mmHg.  3. Right atrial size was severely dilated.  4. Moderate pericardial effusion. The pericardial effusion is posterior  to the left ventricle.  5. The mitral valve is grossly normal. Trivial mitral valve  regurgitation. No evidence of mitral stenosis.  6. Tricuspid valve regurgitation is severe.  7. The aortic valve is tricuspid. Aortic valve regurgitation is not  visualized. Mild aortic valve sclerosis is present, with no evidence of  aortic valve stenosis.  8. The inferior vena cava is dilated in size with <50% respiratory  variability, suggesting right atrial pressure of 15 mmHg.   Micro Data:  4/20 Blood Cx: 1/4 GNR, species not on reflex 4/20 Urine  Cx: E. coli, 40,000, susceptibilities pending   4/20 Resp Cx: Pending   Antimicrobials:  Vanc 4/20 - 4/21 Cefepime 4/20 >>  Interim history/subjective:   Overnight events  include growth noted in blood cultures. Antibiotics were not changed as patient is already on Cefepime. In addition, patient had refractory hypoglycemia and was subsequently started on D10 infusion.   This AM, RN noted patient's temperature normalized, but water in TTM unit is having to be increased to 100 F to maintain patient's body temperature of 37 C. Additionally, she continues to be hypoglycemic despite D10 infusion. Additionally, D50 ampule given.   Objective   Blood pressure (!) 87/74, pulse (!) 104, temperature 97.9 F (36.6 C), temperature source Core, resp. rate 17, height 5\' 5"  (1.651 m), weight 111 kg, SpO2 100 %.    Vent Mode: PRVC FiO2 (%):  [60 %-100 %] 60 % Set Rate:  [20 bmp] 20 bmp Vt Set:  [450 mL] 450 mL PEEP:  [10 cmH20] 10 cmH20 Plateau Pressure:  [18 cmH20-22 cmH20] 21 cmH20   Intake/Output Summary (Last 24 hours) at 04/20/2020 0713 Last data filed at 04/20/2020 0700 Gross per 24 hour  Intake 2728.76 ml  Output 2979 ml  Net -250.24 ml   Filed Weights   04/06/2020 1039 04/19/20 0600 04/20/20 0224  Weight: 110 kg 113.2 kg 111 kg   Physical Exam Vitals and nursing note reviewed.  Constitutional:      Appearance: She is obese. She is toxic-appearing. She is not diaphoretic.     Interventions: She is intubated.     Comments: Sedated  HENT:     Head: Normocephalic and atraumatic.  Eyes:     General: Scleral icterus present.  Cardiovascular:     Rate and Rhythm: Tachycardia present. Rhythm irregular.     Heart sounds: No murmur.     Comments: Continues to have 1+ pitting edema in bilateral upper extremities. Lower extremity edema extends to abdomen Pulmonary:     Effort: Tachypnea present. She is intubated.     Breath sounds: Decreased breath sounds present. No wheezing or rales.  Abdominal:     General: Bowel sounds are decreased.     Palpations: Abdomen is soft.  Musculoskeletal:     Right lower leg: 2+ Pitting Edema present.     Left lower leg: 2+  Pitting Edema present.  Skin:    General: Skin is cool.     Findings: No erythema, lesion or rash.  Neurological:     Comments: Unresponsive to noxious stimuli in all 4 extremities. Gag reflex intact, however corneal reflex is not. Pupils are sluggish and minimally reactive.    Resolved Hospital Problem list     Assessment & Plan:   Acute Hypoxic Respiratory Failure / Chronic Respiratory Failure S/P V. Fib Cardiac Arrest:  Short duration of CPR with immediate bystander CPR initiated.   High suspicion for hypoxia induced arrest, as patient has multiple reasons for worsening hypoxia, including COPD with likely OSA, HFpEF with volume over load and known severe right heart failure. Her blood cultures and urine cultures are both showing E. Coli that may have caused a respiratory insult as well. She is receiving treatment for each of these etiologies, as listed below.  P:  - Cardiology consulted - Rewarming complete - Mechanical Ventilation, not ready for SBT - VAP Protocol - Fentanyl gtt for sedation - CXR tomorrow AM  # Acute Encephalopathy  Too early to evaluate long-term neurological stand point but EEG has not shown  any seizures. Will consider MRI to check for anoxic brain injury, although less likely given her short course of CPR. She did have some head shaking overnight for which she received Versed, but I suspect this is more likely myoclonus than true seizure. Still on continuous EEG, so will follow up results. P:  - Continue continuous EEG, if next reading negative, consider discontinuing - Fentanyl gtt for sedation - Continue Kepra, Versed PRN - Begin daily exam with interruption of continuous sedation   # Shock, Cardiogenic + Septic  Lactic acid initially >11, but trending down. Still elevated at 7.5 this AM.  With growth in her blood cultures, there is question if she has both septic and cardiogenic shock. Unfortunately, these would have competing treatments, including in  regards to fluid status.  P:  - Continue Levophed to maintain MAP > 65.  - Trend lactic acid  - Trend CVP, q shift  # E. Coli Bacteremia  Urine cultures shows E. Coli that is pan-sensitive with blood cultures growing 1/4 GNR. The bottles did not have optimal volume and I suspect this is why speciation of blood cultures were unsuccessful. She has been receiving appropriate treatment with broad spectrum antibiotics.  P:  - Continue Cefepime   # HFpEF with severe right HF TTE during this admission showed EF is unchanged, with no LV wall motion abnormality, however she does have interventricular septum flattening, as her RV function is very severely reduced with significantly increased pressure with volume overload. Discussed starting digoxin to promote improved cardiac function. P:  - Cardiology consulted - Continue CRRT - Digoxin to start today, pending conversation with pharmacy regarding compatibility with CRRT/Amiodarone  # COPD  PFTs are not available in the chart, nor any pulmonology visits. At home, she is on 3L [place], Symbicort BID, and Albuterol PRN.  P: - Duoneb QID - Pulmicort BID  # Acute on Chronic CKD III:  Cr 3.5 (Baseline 2). Likely ATN. Creatinine improving today after starting CRRT.  P:  - Nephrology following, we appreciate their recommendations  - CRRT per Nephrology  - Monitor UOP and renal function - Replete e-lytes as need - Avoid nephrotoxic agents  # Paroxsymal A-Fib:  On Diltiazem and Eliquis at home. During this admission, she has remained in atrial fibrillation with no evidence of RVR at this time. Held off on Diltiazem given her soft BPs but have started Amiodarone yesterday due to rising HR. HR much improved today.  P:  - Holding home Eliquis and Diltiazem - Amiodarone gtt   # Mixed Hyperbilirubinemia # Elevated Transaminases (40s-60s) # Elevated INR Likely secondary to hepatic congestion. Direct bilirubin dominant supporting liver source. RUQ did not  show any stones, although LFTs support a more cholecystic pattern.  P:  - Follow INR, LFTs, T. bili  # Chronic Thrombocytopenia (since 2009)  Down-trending since February, lowest recorded on 4/21 at 91. Low suspicion for hemolytic syndromes in light of hyperbilirubinemia has both are chronic. She did have some upper GI bleeding on admission but this has stopped. Today, her platelets have begun to improve.  P:  - Trend platelets - Monitor for signs of additional active bleeding  # Chronic Macrocytic Anemia  Stable and unchanged. B12 elevated, folate still pending.  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 Holding given change in renal function and elevated INR # Hx of CVA: Holding home Statin  Best practice:  Diet: NPO Pain/Anxiety/Delirium protocol (if indicated): Fentanyl  gtt, Versed PRN VAP protocol (if indicated): Yes DVT prophylaxis: Holding heparin GI prophylaxis: PPI Glucose control: SSI Mobility: BR Code Status: Full Family Communication: Daughter updated at bedside on 4/22 Disposition: ICU  Labs   CBC: Recent Labs  Lab 04/13/2020 1044 04/08/2020 1058 04/19/20 0313 04/19/20 0508 04/19/20 1307 04/20/20 0007 04/20/20 0458  WBC 18.1*  --  16.4*  --   --  19.7*  --   NEUTROABS 13.5*  --   --   --   --  17.2*  --   HGB 11.6*   < > 11.1* 13.6 13.9 12.2 14.6  HCT 42.4   < > 39.3 40.0 41.0 43.6 43.0  MCV 116.2*  --  110.7*  --   --  109.8*  --   PLT 74*  --  58*  --   --  76*  --    < > = values in this interval not displayed.    Basic Metabolic Panel: Recent Labs  Lab 04/21/2020 1138 04/10/2020 1138 03/31/2020 1404 04/23/2020 1444 04/19/20 0313 04/19/20 0313 04/19/20 0508 04/19/20 1307 04/19/20 2032 04/20/20 0442 04/20/20 0458  NA 135   < > 137   < > 137   < > 137 138 137 137 137  K 4.5   < > 4.5   < > 4.1   < > 4.3 4.1 4.5 4.5 4.2  CL 105  --  103  --  100  --   --   --  98 99  --   CO2  --   --  15*  --  23  --   --   --  20*  19*  --   GLUCOSE 120*  --  191*  --  211*  --   --   --  90 71  --   BUN 64*  --  65*  --  64*  --   --   --  62* 51*  --   CREATININE 3.50*  --  3.56*  --  3.86*  --   --   --  3.62* 3.15*  --   CALCIUM  --   --  8.0*  --  8.0*  --   --   --  8.8* 8.5*  --   MG  --   --   --   --  2.1  --   --   --   --  2.3  --   PHOS  --   --   --   --  7.1*  --   --   --  7.2* 6.1*  --    < > = values in this interval not displayed.   GFR: Estimated Creatinine Clearance: 23.9 mL/min (A) (by C-G formula based on SCr of 3.15 mg/dL (H)). Recent Labs  Lab 04/05/2020 1044 04/26/2020 1404 04/12/2020 2303 04/19/20 0313 04/19/20 0815 04/19/20 2032 04/20/20 0007  PROCALCITON  --   --   --   --   --  14.96  --   WBC 18.1*  --   --  16.4*  --   --  19.7*  LATICACIDVEN 11.0*   < > 6.9*  --  6.4* 8.3* 7.5*   < > = values in this interval not displayed.    Liver Function Tests: Recent Labs  Lab 04/21/2020 1404 04/19/20 0313 04/19/20 0815 04/19/20 2032 04/20/20 0442  AST 34 45*  --   --   --   ALT 28 32  --   --   --  ALKPHOS 184* 184*  --   --   --   BILITOT 7.4* 7.4* 7.4*  --   --   PROT 5.3* 5.6*  --   --   --   ALBUMIN 1.9* 1.9*  --  2.1* 2.2*   No results for input(s): LIPASE, AMYLASE in the last 168 hours. No results for input(s): AMMONIA in the last 168 hours.  ABG    Component Value Date/Time   PHART 7.305 (L) 04/20/2020 0458   PCO2ART 39.7 04/20/2020 0458   PO2ART 110 (H) 04/20/2020 0458   HCO3 19.9 (L) 04/20/2020 0458   TCO2 21 (L) 04/20/2020 0458   ACIDBASEDEF 6.0 (H) 04/20/2020 0458   O2SAT 98.0 04/20/2020 0458     Coagulation Profile: Recent Labs  Lab 04/27/2020 1044 04/19/2020 1346 04/20/2020 1946 04/19/20 0313  INR 3.6* 3.4* 2.9* 2.9*    Cardiac Enzymes: No results for input(s): CKTOTAL, CKMB, CKMBINDEX, TROPONINI in the last 168 hours.  HbA1C: Hgb A1c MFr Bld  Date/Time Value Ref Range Status  02/15/2020 01:12 PM 7.5 (H) 4.8 - 5.6 % Final    Comment:    (NOTE) Pre  diabetes:          5.7%-6.4% Diabetes:              >6.4% Glycemic control for   <7.0% adults with diabetes   07/04/2012 05:23 AM 6.4 (H) <5.7 % Final    Comment:    (NOTE)                                                                       According to the ADA Clinical Practice Recommendations for 2011, when HbA1c is used as a screening test:  >=6.5%   Diagnostic of Diabetes Mellitus           (if abnormal result is confirmed) 5.7-6.4%   Increased risk of developing Diabetes Mellitus References:Diagnosis and Classification of Diabetes Mellitus,Diabetes OEHO,1224,82(NOIBB 1):S62-S69 and Standards of Medical Care in         Diabetes - 2011,Diabetes CWUG,8916,94 (Suppl 1):S11-S61.    CBG: Recent Labs  Lab 04/19/20 2046 04/20/20 0020 04/20/20 0227 04/20/20 0417 04/20/20 0529  GLUCAP 80 62* 78 60* 79    Review of Systems:   Unable to assess as patient intubated and unresponsive.  Past Medical History  She,  has a past medical history of Acute ischemic stroke ([place]) (07/03/2012), Crack cocaine use, Diabetes ([place]), Diabetes mellitus ([place]), DVT (deep venous thrombosis) ([place]), Hemiparesis, acute ([place]) (07/04/2012), Hypertension, LVH (left ventricular hypertrophy) (07/04/2012), Myocardial infarct, old, Thrombocytopenia ([place]) (07/03/2012), and Tobacco abuse.   Surgical History    Past Surgical History:  Procedure Laterality Date   ABDOMINAL HYSTERECTOMY     HERNIA REPAIR       Social History   reports that she has been smoking cigarettes. She has been smoking about 1.00 pack per day. She has never used smokeless tobacco. She reports that she does not drink alcohol or use drugs.   Family History   Her family history includes Diabetes in her brother and mother; Heart disease in her father and mother; Hypertension in her father and mother; Stroke in her mother.   Allergies No Known Allergies   Home Medications  Prior to Admission medications   Medication Sig Start Date End Date  Taking? Authorizing Provider  acetaminophen (TYLENOL) 325 MG tablet Take 2 tablets (650 mg total) by mouth every 6 (six) hours as needed for mild pain (or Fever >/= 101). 02/21/20   Emokpae, Courage, MD  albuterol (PROVENTIL) (2.5 MG/3ML) 0.083% nebulizer solution Take 3 mLs (2.5 mg total) by nebulization every 4 (four) hours as needed for wheezing or shortness of breath. 02/21/20   Denton Brick, Courage, MD  albuterol (VENTOLIN HFA) 108 (90 Base) MCG/ACT inhaler Inhale 2 puffs into the lungs every 4 (four) hours as needed for wheezing or shortness of breath. 02/21/20   Denton Brick, Courage, MD  apixaban (ELIQUIS) 5 MG TABS tablet Take 1 tablet (5 mg total) by mouth 2 (two) times daily. 04/14/20 05/14/20  Herminio Commons, MD  atorvastatin (LIPITOR) 10 MG tablet Take 1 tablet (10 mg total) by mouth every evening. 04/14/20   Herminio Commons, MD  budesonide-formoterol (SYMBICORT) 160-4.5 MCG/ACT inhaler Inhale 2 puffs into the lungs 2 (two) times daily. 02/21/20   Roxan Hockey, MD  cephALEXin (KEFLEX) 250 MG capsule Take 250 mg by mouth 3 (three) times daily. 03/25/20   [provider]  diltiazem (CARDIZEM CD) 120 MG 24 hr capsule Take 1 capsule (120 mg total) by mouth daily. 04/14/20 05/14/20  Herminio Commons, MD  furosemide (LASIX) 40 MG tablet Take 1.5 tablets (60 mg total) by mouth daily. 04/14/20   Herminio Commons, MD  HYDROcodone-acetaminophen ([place]) 7.5-325 MG tablet One tablet as needed every six hours for pain. 04/06/20   Sanjuana Kava, MD  linagliptin (TRADJENTA) 5 MG TABS tablet Take 1 tablet (5 mg total) by mouth daily. 03/20/20   Johnson, Clanford L, MD  mirtazapine (REMERON) 7.5 MG tablet Take 7.5 mg by mouth at bedtime. 03/24/20   [provider]     Critical care time:    Pearson Grippe, DO IM PGY-3

## 2020-04-20 NOTE — Progress Notes (Signed)
[facility] Progress Note   Dialysis Orders: Subjective:    Patient evaluated on CRRT via R IJ catheter this morning. Daughter [name] not yet present. 250 mL fluids including TPN going in currently with goal net UF of 100 mL/hr. Patient has persistent diffuse edema of all extremities and abdomen. 25 mL of UOP yesterday. Pressures soft this morning. Requiring  4 mg of levophed currently. Episodes of resistant hypoglycemia this morning per chart.   Objective Vitals:   04/20/20 0700 04/20/20 0800 04/20/20 0900 04/20/20 1000  BP: (!) 87/74 (!) 75/60 (!) 79/62 93/77  Pulse: (!) 104 (!) 103 (!) 103   Resp: '17 19 20 15  '$ Temp:  97.9 F (36.6 C) 97.9 F (36.6 C) (!) 97.5 F (36.4 C)  TempSrc:  Core    SpO2: 100% 93% 100% 100%  Weight:      Height:       Physical Exam: General: Obese female, unresponsive and intubated.  Heart: Irregular rhythm. Tachycardic. No rubs. Lungs: Coarse breath sounds, intubated on mechanical ventilation.  Abdomen: Distended with swelling into pubic area. BS +  Extremities: Skin warm, somewhat jaundiced. 1+ edema to BL UE, 2+ edema to BL LE, lower legs wrapped due to weeping. Dialysis Access: R IJ double lumen catheter - in use  Filed Weights   04/11/2020 1039 04/19/20 0600 04/20/20 0224  Weight: 110 kg 113.2 kg 111 kg    Intake/Output Summary (Last 24 hours) at 04/20/2020 1058 Last data filed at 04/20/2020 1000 Gross per 24 hour  Intake 2768.53 ml  Output 3674 ml  Net -905.47 ml    Additional Objective Labs: Basic Metabolic Panel: Recent Labs  Lab 04/19/20 0313 04/19/20 0508 04/19/20 2032 04/20/20 0442 04/20/20 0458  NA 137   < > 137 137 137  K 4.1   < > 4.5 4.5 4.2  CL 100  --  98 99  --   CO2 23  --  20* 19*  --   GLUCOSE 211*  --  90 71  --   BUN 64*  --  62* 51*  --   CREATININE 3.86*  --  3.62* 3.15*  --   CALCIUM 8.0*  --  8.8* 8.5*  --   PHOS 7.1*  --  7.2* 6.1*  --    < > = values in this interval not displayed.    Liver Function Tests: Recent Labs  Lab 04/25/2020 1404 03/31/2020 1404 04/19/20 0313 04/19/20 0313 04/19/20 0815 04/19/20 2032 04/20/20 0442 04/20/20 0811  AST 34  --  45*  --   --   --   --  49*  ALT 28  --  32  --   --   --   --  38  ALKPHOS 184*  --  184*  --   --   --   --  221*  BILITOT 7.4*   < > 7.4*  --  7.4*  --   --  10.2*  PROT 5.3*  --  5.6*  --   --   --   --  6.4*  ALBUMIN 1.9*   < > 1.9*   < >  --  2.1* 2.2* 2.2*   < > = values in this interval not displayed.   No results for input(s): LIPASE, AMYLASE in the last 168 hours. CBC: Recent Labs  Lab 04/19/2020 1044 04/26/2020 1058 04/19/20 0313 04/19/20 0508 04/19/20 1307 04/20/20 0007 04/20/20 0458  WBC 18.1*  --  16.4*  --   --  19.7*  --   NEUTROABS 13.5*  --   --   --   --  17.2*  --   HGB 11.6*   < > 11.1*   < > 13.9 12.2 14.6  HCT 42.4   < > 39.3   < > 41.0 43.6 43.0  MCV 116.2*  --  110.7*  --   --  109.8*  --   PLT 74*  --  58*  --   --  76*  --    < > = values in this interval not displayed.   Blood Culture:    Component Value Date/Time   SDES BLOOD RIGHT ANTECUBITAL 04/23/2020 1113   SPECREQUEST  04/08/2020 1113    BOTTLES DRAWN AEROBIC AND ANAEROBIC Blood Culture results may not be optimal due to an excessive volume of blood received in culture bottles   CULT  04/17/2020 1113    NO GROWTH 2 DAYS Performed at [facility], [place] [address] [place], [place] [address]    REPTSTATUS PENDING 04/17/2020 1113    Cardiac Enzymes: No results for input(s): CKTOTAL, CKMB, CKMBINDEX, TROPONINI in the last 168 hours. CBG: Recent Labs  Lab 04/20/20 0020 04/20/20 0227 04/20/20 0417 04/20/20 0529 04/20/20 0805  GLUCAP 62* 78 60* 79 63*   Iron Studies: No results for input(s): IRON, TIBC, TRANSFERRIN, FERRITIN in the last 72 hours. Lab Results  Component Value Date   INR 2.9 (H) 04/20/2020   INR 2.9 (H) 04/19/2020   INR 2.9 (H) 04/08/2020   Studies/Results: CT HEAD WO CONTRAST  Result  Date: 04/01/2020 CLINICAL DATA:  Altered mental status. EXAM: CT HEAD WITHOUT CONTRAST TECHNIQUE: Contiguous axial images were obtained from the base of the skull through the vertex without intravenous contrast. COMPARISON:  March 13, 2020. FINDINGS: Brain: Mild diffuse cortical atrophy is noted. Mild chronic ischemic white matter disease is noted. No mass effect or midline shift is noted. Ventricular size is within normal limits. There is no evidence of mass lesion, hemorrhage or acute infarction. Vascular: No hyperdense vessel or unexpected calcification. Skull: Normal. Negative for fracture or focal lesion. Sinuses/Orbits: No acute finding. Other: None. IMPRESSION: Mild diffuse cortical atrophy. Mild chronic ischemic white matter disease. No acute intracranial abnormality seen. Electronically Signed   By: Marijo Conception M.D.   On: 04/22/2020 14:46   CT Chest Wo Contrast  Result Date: 04/04/2020 CLINICAL DATA:  Hemoptysis. EXAM: CT CHEST WITHOUT CONTRAST TECHNIQUE: Multidetector CT imaging of the chest was performed following the standard protocol without IV contrast. COMPARISON:  03/21/2015. Portable chest obtained earlier today. FINDINGS: Cardiovascular: Stable enlarged heart. Small pericardial effusion with a maximum thickness of 1.2 cm, not previously present. Atheromatous calcifications, including the coronary arteries and aorta. Mediastinum/Nodes: Endotracheal tube tip at the top of the carina. Feeding tube extending into the stomach. Multiple bilateral thyroid nodules. The largest is on the right measuring 2.5 cm in maximum diameter. Enlarged precarinal node with a short axis diameter of 21 mm, previously 19 mm. No other enlarged lymph nodes are seen. Lungs/Pleura: Small bilateral pleural effusions. Dense patchy opacity and consolidation in the posterior half of the left lung involving the left lower lobe and left upper lobe. Milder patchy opacity in the right lung, primarily in the dependent right  lower lobe, with underlying cylindrical bronchiectasis. Mild patchy ground-glass opacity in both lungs. Upper Abdomen: Small amount of free peritoneal fluid. Musculoskeletal: Thoracic spine degenerative changes. IMPRESSION: 1. Dense patchy opacity and consolidation in the posterior half  of the left lung involving the left lower lobe and left upper lobe, compatible with pneumonia and atelectasis. 2. Milder patchy opacity in the right lung, primarily in the dependent right lower lobe, with underlying cylindrical bronchiectasis. This could be chronic or represent some superimposed infection. 3. Mild patchy ground-glass opacity in both lungs, compatible with mild interstitial pulmonary edema/inflammation. 4. Small bilateral pleural effusions. 5. Small amount of free peritoneal fluid. 6. Multiple bilateral thyroid nodules, largest on the right measuring 2.5 cm in maximum diameter. Recommend thyroid US (ref: J Am Coll Radiol. 2015 Feb;12(2): 143-50). 7. Calcific coronary artery and aortic atherosclerosis. Aortic Atherosclerosis (ICD10-I70.0). Electronically Signed   By: Claudie Revering M.D.   On: 03/30/2020 14:51   DG CHEST PORT 1 VIEW  Result Date: 04/19/2020 CLINICAL DATA:  59 year old female with central line placement. EXAM: PORTABLE CHEST 1 VIEW COMPARISON:  Earlier radiograph dated 04/19/2020. FINDINGS: Right IJ central venous line with tip over central SVC. No pneumothorax. Endotracheal tube remains above the carina. No significant interval change in the appearance of the lungs or cardiomediastinal silhouette since the earlier radiograph. IMPRESSION: Right IJ central venous line with tip over central SVC. No pneumothorax. Electronically Signed   By: Anner Crete M.D.   On: 04/19/2020 17:13   DG Chest Port 1 View  Result Date: 04/19/2020 CLINICAL DATA:  Cardiac arrest. EXAM: PORTABLE CHEST 1 VIEW COMPARISON:  04/20/2020 FINDINGS: The endotracheal tube is 2.4 cm above the carina. The NG tube is coursing down  the esophagus and into the stomach. External pacer paddles are again noted. Stable cardiac enlargement and age advanced calcification of the thoracic aorta. Slight worsening lung aeration probably in part due to lower lung volumes. Persistent interstitial and airspace process without large pleural effusions. IMPRESSION: 1. The endotracheal tube is 2.4 cm above the carina. 2. NG tube in good position. 3. Slight worsening lung aeration. Electronically Signed   By: Marijo Sanes M.D.   On: 04/19/2020 08:47   DG Chest Port 1 View  Result Date: 03/31/2020 CLINICAL DATA:  Endotracheal tube placement.  Post CPR. EXAM: PORTABLE CHEST 1 VIEW COMPARISON:  Chest x-ray dated March 13, 2020. FINDINGS: Endotracheal tube tip in good position approximately 2.8 cm above the carina. Enteric tube tip just within the stomach with the proximal side port in the lower esophagus. Chronic cardiomegaly. Pulmonary vascular congestion. Hazy perihilar opacities with consolidation in the left upper and lower lobes. Scattered scarring in the right lung. No pneumothorax or large pleural effusion. No acute osseous abnormality. IMPRESSION: 1. Appropriately positioned endotracheal tube. 2. Enteric tube tip just within the stomach with the proximal side port in the lower esophagus. Recommend advancement. 3. Hazy perihilar opacities with consolidation in the left upper and lower lobes likely reflecting a degree of pulmonary edema with superimposed pneumonia in the left lung not excluded. Electronically Signed   By: Titus Dubin M.D.   On: 04/19/2020 11:26   EEG adult  Result Date: 04/19/2020 Lora Havens, MD     04/19/2020  8:42 AM Patient Name: ROSSIE SCARFONE MRN: [id number] Epilepsy Attending: Lora Havens Referring Physician/Provider: Dr Neva Seat Date: 04/22/2020 Duration: 20.33 mins Patient history: 59yo F s/p cardiac arrest on TTM. EEG to evaluate for seizure Level of alertness: comatose AEDs during EEG study: LEV  Technical aspects: This EEG study was done with scalp electrodes positioned according to the 10-20 International system of electrode placement. Electrical activity was acquired at a sampling rate of '500Hz'$  and reviewed with a  high frequency filter of '70Hz'$  and a low frequency filter of '1Hz'$ . EEG data were recorded continuously and digitally stored. DESCRIPTION: EEG showed continuous generalized background attenuation. Reactivity was not tested during this study. Hyperventilation and photic stimulation were not performed. ABNORMALITY - Background attenuation, generalized IMPRESSION: This study is suggestive of profound diffuse encephalopathy, non specific to etiology. No seizures or epileptiform discharges were seen throughout the recording. Priyanka Barbra Sarks   Overnight EEG with video  Result Date: 04/19/2020 Lora Havens, MD     04/20/2020 10:06 AM Patient Name: KIZZY OLAFSON MRN: [id number] Epilepsy Attending: Lora Havens Referring Physician/Provider: Dr Neva Seat Duration: 04/12/2020 2050 to 04/19/2020 2050  Patient history: 59yo F s/p cardiac arrest on TTM. EEG to evaluate for seizure  Level of alertness: comatose  AEDs during EEG study: LEV  Technical aspects: This EEG study was done with scalp electrodes positioned according to the 10-20 International system of electrode placement. Electrical activity was acquired at a sampling rate of '500Hz'$  and reviewed with a high frequency filter of '70Hz'$  and a low frequency filter of '1Hz'$ . EEG data were recorded continuously and digitally stored.  DESCRIPTION: EEG showed continuous generalized background attenuation. Reactivity was not tested during this study. Hyperventilation and photic stimulation were not performed.  ABNORMALITY - Background attenuation, generalized  IMPRESSION: This study is suggestive of profound diffuse encephalopathy, non specific to etiology. No seizures or epileptiform discharges were seen throughout the recording.  Lora Havens   ECHOCARDIOGRAM COMPLETE  Result Date: 04/19/2020    ECHOCARDIOGRAM REPORT   Patient Name:   FEVEN ALDERFER Date of Exam: 04/19/2020 Medical Rec #:  [id number]             Height:       65.0 in Accession #:    [id number]            Weight:       249.6 lb Date of Birth:  1961/06/15            BSA:          2.173 m Patient Age:    59 years              BP:           119/74 mmHg Patient Gender: F                     HR:           102 bpm. Exam Location:  Inpatient Procedure: 2D Echo, Cardiac Doppler and Color Doppler Indications:    Cardiac arrest  History:        Patient has prior history of Echocardiogram examinations, most                 recent 02/16/2020. Previous Myocardial Infarction, Stroke; Risk                 Factors:Dyslipidemia and Current Smoker. Substanace abuse.  Sonographer:    Mikki Santee RDCS (AE) Referring Phys: [place]  1. Left ventricular ejection fraction, by estimation, is 55 to 60%. The left ventricle has normal function. The left ventricle has no regional wall motion abnormalities. There is mild concentric left ventricular hypertrophy. Left ventricular diastolic function could not be evaluated. There is the interventricular septum is flattened in systole and diastole, consistent with right ventricular pressure and volume overload.  2. RVSP underestimated in setting of severe TR. Right ventricular systolic function is severely reduced.  The right ventricular size is severely enlarged. There is mildly elevated pulmonary artery systolic pressure. The estimated right ventricular systolic pressure is 35.3 mmHg.  3. Right atrial size was severely dilated.  4. Moderate pericardial effusion. The pericardial effusion is posterior to the left ventricle.  5. The mitral valve is grossly normal. Trivial mitral valve regurgitation. No evidence of mitral stenosis.  6. Tricuspid valve regurgitation is severe.  7. The aortic valve is tricuspid. Aortic  valve regurgitation is not visualized. Mild aortic valve sclerosis is present, with no evidence of aortic valve stenosis.  8. The inferior vena cava is dilated in size with <50% respiratory variability, suggesting right atrial pressure of 15 mmHg. Comparison(s): Changes from prior study are noted. Moderate pericardial effusion now present. Severe RV dilation and severely reduced RV function remains. Conclusion(s)/Recommendation(s): Findings consistent with Cor Pulmonale. FINDINGS  Left Ventricle: Left ventricular ejection fraction, by estimation, is 55 to 60%. The left ventricle has normal function. The left ventricle has no regional wall motion abnormalities. The left ventricular internal cavity size was small. There is mild concentric left ventricular hypertrophy. The interventricular septum is flattened in systole and diastole, consistent with right ventricular pressure and volume overload. Left ventricular diastolic function could not be evaluated due to atrial fibrillation. Left ventricular diastolic function could not be evaluated. Right Ventricle: RVSP underestimated in setting of severe TR. The right ventricular size is severely enlarged. No increase in right ventricular wall thickness. Right ventricular systolic function is severely reduced. There is mildly elevated pulmonary artery systolic pressure. The tricuspid regurgitant velocity is 2.71 m/s, and with an assumed right atrial pressure of 15 mmHg, the estimated right ventricular systolic pressure is 29.9 mmHg. Left Atrium: Left atrial size was normal in size. Right Atrium: Right atrial size was severely dilated. Pericardium: A moderately sized pericardial effusion is present. The pericardial effusion is posterior to the left ventricle. Presence of pericardial fat pad. Mitral Valve: The mitral valve is grossly normal. Trivial mitral valve regurgitation. No evidence of mitral valve stenosis. Tricuspid Valve: The tricuspid valve is grossly normal.  Tricuspid valve regurgitation is severe. No evidence of tricuspid stenosis. The flow in the hepatic veins is reversed during ventricular systole. Aortic Valve: The aortic valve is tricuspid. . There is moderate thickening and moderate calcification of the aortic valve. Aortic valve regurgitation is not visualized. Mild aortic valve sclerosis is present, with no evidence of aortic valve stenosis. There is moderate thickening of the aortic valve. There is moderate calcification of the aortic valve. Pulmonic Valve: The pulmonic valve was grossly normal. Pulmonic valve regurgitation is trivial. No evidence of pulmonic stenosis. Aorta: The aortic root and ascending aorta are structurally normal, with no evidence of dilitation. Venous: The inferior vena cava is dilated in size with less than 50% respiratory variability, suggesting right atrial pressure of 15 mmHg. IAS/Shunts: There is left bowing of the interatrial septum, suggestive of elevated right atrial pressure. The atrial septum is grossly normal. Additional Comments: There is a small pleural effusion in the left lateral region.  LEFT VENTRICLE PLAX 2D LVIDd:         4.10 cm LVIDs:         2.95 cm LV PW:         1.20 cm LV IVS:        1.20 cm LVOT diam:     2.30 cm LV SV:         38 LV SV Index:   17 LVOT Area:  4.15 cm  LEFT ATRIUM             Index       RIGHT ATRIUM           Index LA diam:        3.40 cm 1.56 cm/m  RA Area:     42.60 cm LA Vol (A2C):   59.4 ml 27.33 ml/m RA Volume:   178.00 ml 81.90 ml/m LA Vol (A4C):   59.8 ml 27.52 ml/m LA Biplane Vol: 65.6 ml 30.18 ml/m  AORTIC VALVE LVOT Vmax:   52.00 cm/s LVOT Vmean:  33.600 cm/s LVOT VTI:    0.091 m  AORTA Ao Root diam: 3.60 cm TRICUSPID VALVE TR Peak grad:   29.4 mmHg TR Vmax:        271.00 cm/s  SHUNTS Systemic VTI:  0.09 m Systemic Diam: 2.30 cm Eleonore Chiquito MD Electronically signed by Eleonore Chiquito MD Signature Date/Time: 04/19/2020/12:02:30 PM    Final    US LIVER DOPPLER  Result Date:  04/19/2020 CLINICAL DATA:  Abnormal liver function tests. EXAM: DUPLEX ULTRASOUND OF LIVER TECHNIQUE: Color and duplex Doppler ultrasound was performed to evaluate the hepatic in-flow and out-flow vessels. COMPARISON:  Right upper quadrant abdominal ultrasound on 03/07/2020 FINDINGS: Portal Vein Velocities Due to inability to suspend breathing on a ventilator, the portal vein could not be accurately sampled by duplex ultrasound. There clearly is color Doppler flow in the portal vein and flow is towards the liver. Hepatic Vein Velocities Right:  59 cm/sec Middle:  51 cm/sec Left:  49 cm/sec Hepatic vein waveforms appear normal. The intrahepatic IVC is patent. Hepatic Artery Velocity:  123 cm/sec Splenic Vein Velocity:  The splenic vein was not able to be sampled. Varices: None visualized. Ascites: A small amount of ascites was visualized in the peritoneal cavity. IMPRESSION: Limited study due to inability to accurately sample portal veins or splenic vein by duplex ultrasound due to respiratory motion. The portal vein is not occluded by color Doppler. Hepatic veins are patent and demonstrate normal waveforms and velocities. A small amount of ascites was identified. Electronically Signed   By: Aletta Edouard M.D.   On: 04/19/2020 16:25   US Abdomen Limited RUQ  Result Date: 04/19/2020 CLINICAL DATA:  Hyperbilirubinemia EXAM: ULTRASOUND ABDOMEN LIMITED RIGHT UPPER QUADRANT COMPARISON:  03/07/2020 FINDINGS: Gallbladder: Layering biliary sludge present within the gallbladder lumen. No shadowing gallstones or wall thickening visualized. No sonographic Murphy sign noted by sonographer. Common bile duct: Diameter: 4 mm Liver: No focal lesion identified. Within normal limits in parenchymal echogenicity. Portal vein is patent on color Doppler imaging with normal direction of blood flow towards the liver. Other: Small volume ascites. Partially visualized right pleural effusion. IMPRESSION: 1. Biliary sludge without  evidence of cholecystitis. 2. Small volume ascites. 3. Partially visualized right-sided pleural effusion. Electronically Signed   By: Davina Poke D.O.   On: 04/19/2020 16:23    Medications: .  prismasol BGK 4/2.5 500 mL/hr at 04/19/20 1912  .  prismasol BGK 4/2.5 200 mL/hr at 04/19/20 1912  . sodium chloride    . amiodarone 30 mg/hr (04/19/20 2200)  . ceFEPime (MAXIPIME) IV 200 mL/hr at 04/20/20 1000  . dextrose 20 mL/hr at 04/20/20 1000  . feeding supplement (VITAL HIGH PROTEIN) 1,000 mL (04/20/20 1010)  . fentaNYL infusion INTRAVENOUS Stopped (04/20/20 0905)  . levETIRAcetam Stopped (04/20/20 0548)  . norepinephrine (LEVOPHED) Adult infusion 10 mcg/min (04/20/20 1000)  . prismasol BGK 4/2.5 1,000 mL/hr at 04/19/20  1912   . budesonide (PULMICORT) nebulizer solution  0.5 mg Nebulization BID  . chlorhexidine gluconate (MEDLINE KIT)  15 mL Mouth Rinse BID  . Chlorhexidine Gluconate Cloth  6 each Topical Daily  . insulin aspart  3-9 Units Subcutaneous Q4H  . ipratropium-albuterol  3 mL Nebulization Q6H  . mouth rinse  15 mL Mouth Rinse 10 times per day  . pantoprazole (PROTONIX) IV  40 mg Intravenous QHS    Assessment/Plan: Patient is a 59 y.o. female with PMH of right-sided HF, HTN, DM, progressive CKD, COPD, and Afib with multiple recent admissions for volume overload/AKI who presented [facility]/21/2020 following cardiopulmonary arrest. She was encephalopathic with hypoxic hypercapnic respiratory failure requiring intubation with mechanical ventilation. Cr 3.5 on admission. Estimated baseline Cr 1.3-1.9. Receiving care in the cardiac ICU. Has completed therapeutic cooling and warming. Receiving continuous EEG. Nephrology consulted for AKI and anuria. CRRT began 04/19/20 via R IJ catheter with current net UF of 100 mL/hr.   1.  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  - Phos down from 7.1 to 6.1. Aggressively removing with CRRT. Cr 3.15 this morning.  - Primary team concerned for both cardiogenic and septic shock.  PLAN: - Continue CRRT with goal net UF 100 mL/hr to help with her volume status.  - Will monitor renal function and reassess volume status.   2. Acute on chronic R-sided HF - Lasix discontinued with initiation of CRRT.  - Will monitor volume status closely along with ICU team.   3. Paroxsymal A-Fib - On diltiazem and eliquis at home. Being held currently. Remaining in afib thus far this admission.  - Cardiology consulted and started amiodarone. Primary team discussion compatibility of CRRT and amiodarone with pharmacy.   4. Acute encephalopathy - Neurological prognosis remains unclear. 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  - Will continue to support patient from a renal standpoint helping with her volume status as her neurological status is determined.   5. E. Coli Bacteremia - On cefepime. Continue per primary team. Will follow renal function.   4. T2DM  - Per primary team    The nephrology team will continue following the care of this patient of [place]. Thank you for the consult. Please contact nephrology attending, Dr. Pearson Grippe, with questions.    Marisa Sprinkles, PA-S2 [facility]

## 2020-04-20 NOTE — Progress Notes (Signed)
Arctic sun normothermia stopped due to prolonged increase in water temp.

## 2020-04-20 NOTE — Progress Notes (Signed)
Hypoglycemic Event  CBG: 62  Treatment: 25g of D50  Symptoms: UTA; pt is unresposnive  Follow-up CBG: Time:  CBG Result: 78  Possible Reasons for Event: UTA  Comments/MD notified: Will continue to monitor    [employer]

## 2020-04-20 NOTE — Progress Notes (Signed)
Hypoglycemic Event  CBG: 60  Treatment: 25g of D50 IV  Symptoms: UTA  Follow-up CBG: Time: CBG Result: 79  Possible Reasons for Event: UTA  Comments/MD notified: Elink notified. Order was given to start D10 at 59mL/hr.     Roneka Gilpin Juliette Mangle

## 2020-04-20 NOTE — Progress Notes (Addendum)
PHARMACY - PHYSICIAN COMMUNICATION CRITICAL VALUE ALERT - BLOOD CULTURE IDENTIFICATION (BCID)  Tracy [name] is an 59 y.o. female who presented to [facility] on 04/06/2020 s/p cardiac arrest  Assessment:   1/2 blood cultures growing Gram negative rods.    Name of physician (or Provider) Contacted:  Dr. Lucile Shutters  Current antibiotics: Cefepime  Changes to prescribed antibiotics recommended:  No changes at this time  Results for orders placed or performed during the hospital encounter of 03/30/2020  Blood Culture ID Panel (Reflexed) (Collected: 04/25/2020 11:13 AM)  Result Value Ref Range   Enterococcus species NOT DETECTED NOT DETECTED   Listeria monocytogenes NOT DETECTED NOT DETECTED   Staphylococcus species NOT DETECTED NOT DETECTED   Staphylococcus aureus (BCID) NOT DETECTED NOT DETECTED   Streptococcus species NOT DETECTED NOT DETECTED   Streptococcus agalactiae NOT DETECTED NOT DETECTED   Streptococcus pneumoniae NOT DETECTED NOT DETECTED   Streptococcus pyogenes NOT DETECTED NOT DETECTED   Acinetobacter baumannii NOT DETECTED NOT DETECTED   Enterobacteriaceae species NOT DETECTED NOT DETECTED   Enterobacter cloacae complex NOT DETECTED NOT DETECTED   Escherichia coli NOT DETECTED NOT DETECTED   Klebsiella oxytoca NOT DETECTED NOT DETECTED   Klebsiella pneumoniae NOT DETECTED NOT DETECTED   Proteus species NOT DETECTED NOT DETECTED   Serratia marcescens NOT DETECTED NOT DETECTED   Haemophilus influenzae NOT DETECTED NOT DETECTED   Neisseria meningitidis NOT DETECTED NOT DETECTED   Pseudomonas aeruginosa NOT DETECTED NOT DETECTED   Candida albicans NOT DETECTED NOT DETECTED   Candida glabrata NOT DETECTED NOT DETECTED   Candida krusei NOT DETECTED NOT DETECTED   Candida parapsilosis NOT DETECTED NOT DETECTED   Candida tropicalis NOT DETECTED NOT DETECTED    [name] 04/20/2020  2:01 AM

## 2020-04-20 NOTE — Progress Notes (Signed)
Hypoglycemic Event  CBG: 55  Treatment: 25g of IV D50 Symptoms: UTA  Follow-up CBG: Time:2017 CBG Result:79  Possible Reasons for Event: UTA Comments/MD notified:Will continue to monitor     [employer]

## 2020-04-20 NOTE — Progress Notes (Signed)
eLink Physician-Brief Progress Note Patient Name: Tracy [name] DOB: 26-Nov-1961 MRN: [id number]   Date of Service  04/20/2020  HPI/Events of Note  Pt with recurrent hypoglycemia despite D 50 W boluses x 2  eICU Interventions  D 10 W infusion ordered at 20 ml/ hour        Frederik Pear 04/20/2020, 4:59 AM

## 2020-04-20 NOTE — Progress Notes (Signed)
Progress Note  Patient Name: Tracy [name] Date of Encounter: 04/20/2020  Primary Cardiologist: Kate Sable, MD   Subjective   A family member is at bedside.  The patient is noncommunicative.  Inpatient Medications    Scheduled Meds: . budesonide (PULMICORT) nebulizer solution  0.5 mg Nebulization BID  . chlorhexidine gluconate (MEDLINE KIT)  15 mL Mouth Rinse BID  . Chlorhexidine Gluconate Cloth  6 each Topical Daily  . insulin aspart  3-9 Units Subcutaneous Q4H  . ipratropium-albuterol  3 mL Nebulization Q6H  . mouth rinse  15 mL Mouth Rinse 10 times per day  . pantoprazole (PROTONIX) IV  40 mg Intravenous QHS   Continuous Infusions: .  prismasol BGK 4/2.5 500 mL/hr at 04/19/20 1912  .  prismasol BGK 4/2.5 200 mL/hr at 04/19/20 1912  . sodium chloride    . amiodarone 30 mg/hr (04/19/20 2200)  . ceFEPime (MAXIPIME) IV Stopped (04/20/20 1028)  . dextrose 20 mL/hr at 04/20/20 1100  . feeding supplement (VITAL HIGH PROTEIN) 1,000 mL (04/20/20 1010)  . fentaNYL infusion INTRAVENOUS Stopped (04/20/20 0905)  . levETIRAcetam Stopped (04/20/20 0548)  . norepinephrine (LEVOPHED) Adult infusion 10 mcg/min (04/20/20 1100)  . prismasol BGK 4/2.5 1,000 mL/hr at 04/20/20 1110   PRN Meds: docusate sodium, heparin, heparin, midazolam, polyethylene glycol, polyvinyl alcohol   Vital Signs    Vitals:   04/20/20 0800 04/20/20 0900 04/20/20 1000 04/20/20 1100  BP: (!) 75/60 (!) 79/62 93/77 (!) 87/72  Pulse: (!) 103 (!) 103    Resp: _0 Temp: 97.9 F (36.6 C) 97.9 F (36.6 C) (!) 97.5 F (36.4 C) (!) 97.2 F (36.2 C)  TempSrc: Core     SpO2: 93% 100% 100%   Weight:      Height:        Intake/Output Summary (Last 24 hours) at 04/20/2020 1131 Last data filed at 04/20/2020 1100 Gross per 24 hour  Intake 2674.68 ml  Output 3920 ml  Net -1245.32 ml   Last 3 Weights 04/20/2020 04/19/2020 04/19/2020  Weight (lbs) 244 lb 11.4 oz 249 lb 9 oz 242 lb 8.1 oz    Weight (kg) 111 kg 113.2 kg 110 kg      Telemetry    Monitor currently showing normal sinus rhythm- Personally Reviewed  ECG    At 10:56 AM demonstrating normal sinus rhythm, intraventricular conduction delay, and when compared to tracings prior to admission, atrial fibrillation is no longer present.- Personally Reviewed  Physical Exam  Morbidly obese GEN:  Intubated Neck:  Unable to assess JVD Cardiac:  Heart sounds are distant Respiratory:  No wheezing GI:  Obese abdomen Neuro:   Comatose   Labs    High Sensitivity Troponin:   Recent Labs  Lab 04/22/2020 1404  TROPONINIHS 57*      Chemistry Recent Labs  Lab 04/20/2020 1404 04/23/2020 1444 04/19/20 0313 04/19/20 0508 04/19/20 0815 04/19/20 1307 04/19/20 2032 04/20/20 0442 04/20/20 0458 04/20/20 0811  NA 137   < > 137   < >  --    < > 137 137 137  --   K 4.5   < > 4.1   < >  --    < > 4.5 4.5 4.2  --   CL 103   < > 100  --   --   --  98 99  --   --   CO2 15*   < > 23  --   --   --  20* 19*  --   --   GLUCOSE 191*   < > 211*  --   --   --  90 71  --   --   BUN 65*   < > 64*  --   --   --  62* 51*  --   --   CREATININE 3.56*   < > 3.86*  --   --   --  3.62* 3.15*  --   --   CALCIUM 8.0*   < > 8.0*  --   --   --  8.8* 8.5*  --   --   PROT 5.3*  --  5.6*  --   --   --   --   --   --  6.4*  ALBUMIN 1.9*   < > 1.9*   < >  --   --  2.1* 2.2*  --  2.2*  AST 34  --  45*  --   --   --   --   --   --  49*  ALT 28  --  32  --   --   --   --   --   --  38  ALKPHOS 184*  --  184*  --   --   --   --   --   --  221*  BILITOT 7.4*   < > 7.4*  --  7.4*  --   --   --   --  10.2*  GFRNONAA 13*   < > 12*  --   --   --  13* 15*  --   --   GFRAA 15*   < > 14*  --   --   --  15* 18*  --   --   ANIONGAP 19*   < > 14  --   --   --  19* 19*  --   --    < > = values in this interval not displayed.     Hematology Recent Labs  Lab 04/25/2020 1044 04/10/2020 1058 04/19/20 0313 04/19/20 0508 04/19/20 1307 04/20/20 0007 04/20/20 0458   WBC 18.1*  --  16.4*  --   --  19.7*  --   RBC 3.65*  --  3.55*  --   --  3.97  --   HGB 11.6*   < > 11.1*   < > 13.9 12.2 14.6  HCT 42.4   < > 39.3   < > 41.0 43.6 43.0  MCV 116.2*  --  110.7*  --   --  109.8*  --   MCH 31.8  --  31.3  --   --  30.7  --   MCHC 27.4*  --  28.2*  --   --  28.0*  --   RDW Not Measured  --  30.0*  --   --  30.3*  --   PLT 74*  --  58*  --   --  76*  --    < > = values in this interval not displayed.    BNP Recent Labs  Lab 04/14/2020 1040  BNP 933.6*     DDimer No results for input(s): DDIMER in the last 168 hours.   Radiology    CT HEAD WO CONTRAST  Result Date: 04/22/2020 CLINICAL DATA:  Altered mental status. EXAM: CT HEAD WITHOUT CONTRAST TECHNIQUE: Contiguous axial images were obtained from the base of the skull through the vertex  without intravenous contrast. COMPARISON:  March 13, 2020. FINDINGS: Brain: Mild diffuse cortical atrophy is noted. Mild chronic ischemic white matter disease is noted. No mass effect or midline shift is noted. Ventricular size is within normal limits. There is no evidence of mass lesion, hemorrhage or acute infarction. Vascular: No hyperdense vessel or unexpected calcification. Skull: Normal. Negative for fracture or focal lesion. Sinuses/Orbits: No acute finding. Other: None. IMPRESSION: Mild diffuse cortical atrophy. Mild chronic ischemic white matter disease. No acute intracranial abnormality seen. Electronically Signed   By: Marijo Conception M.D.   On: 04/14/2020 14:46   CT Chest Wo Contrast  Result Date: 04/10/2020 CLINICAL DATA:  Hemoptysis. EXAM: CT CHEST WITHOUT CONTRAST TECHNIQUE: Multidetector CT imaging of the chest was performed following the standard protocol without IV contrast. COMPARISON:  03/21/2015. Portable chest obtained earlier today. FINDINGS: Cardiovascular: Stable enlarged heart. Small pericardial effusion with a maximum thickness of 1.2 cm, not previously present. Atheromatous calcifications,  including the coronary arteries and aorta. Mediastinum/Nodes: Endotracheal tube tip at the top of the carina. Feeding tube extending into the stomach. Multiple bilateral thyroid nodules. The largest is on the right measuring 2.5 cm in maximum diameter. Enlarged precarinal node with a short axis diameter of 21 mm, previously 19 mm. No other enlarged lymph nodes are seen. Lungs/Pleura: Small bilateral pleural effusions. Dense patchy opacity and consolidation in the posterior half of the left lung involving the left lower lobe and left upper lobe. Milder patchy opacity in the right lung, primarily in the dependent right lower lobe, with underlying cylindrical bronchiectasis. Mild patchy ground-glass opacity in both lungs. Upper Abdomen: Small amount of free peritoneal fluid. Musculoskeletal: Thoracic spine degenerative changes. IMPRESSION: 1. Dense patchy opacity and consolidation in the posterior half of the left lung involving the left lower lobe and left upper lobe, compatible with pneumonia and atelectasis. 2. Milder patchy opacity in the right lung, primarily in the dependent right lower lobe, with underlying cylindrical bronchiectasis. This could be chronic or represent some superimposed infection. 3. Mild patchy ground-glass opacity in both lungs, compatible with mild interstitial pulmonary edema/inflammation. 4. Small bilateral pleural effusions. 5. Small amount of free peritoneal fluid. 6. Multiple bilateral thyroid nodules, largest on the right measuring 2.5 cm in maximum diameter. Recommend thyroid US (ref: J Am Coll Radiol. 2015 Feb;12(2): 143-50). 7. Calcific coronary artery and aortic atherosclerosis. Aortic Atherosclerosis (ICD10-I70.0). Electronically Signed   By: Claudie Revering M.D.   On: 04/09/2020 14:51   DG CHEST PORT 1 VIEW  Result Date: 04/19/2020 CLINICAL DATA:  59 year old female with central line placement. EXAM: PORTABLE CHEST 1 VIEW COMPARISON:  Earlier radiograph dated 04/19/2020.  FINDINGS: Right IJ central venous line with tip over central SVC. No pneumothorax. Endotracheal tube remains above the carina. No significant interval change in the appearance of the lungs or cardiomediastinal silhouette since the earlier radiograph. IMPRESSION: Right IJ central venous line with tip over central SVC. No pneumothorax. Electronically Signed   By: Anner Crete M.D.   On: 04/19/2020 17:13   DG Chest Port 1 View  Result Date: 04/19/2020 CLINICAL DATA:  Cardiac arrest. EXAM: PORTABLE CHEST 1 VIEW COMPARISON:  04/22/2020 FINDINGS: The endotracheal tube is 2.4 cm above the carina. The NG tube is coursing down the esophagus and into the stomach. External pacer paddles are again noted. Stable cardiac enlargement and age advanced calcification of the thoracic aorta. Slight worsening lung aeration probably in part due to lower lung volumes. Persistent interstitial and airspace process without large pleural  effusions. IMPRESSION: 1. The endotracheal tube is 2.4 cm above the carina. 2. NG tube in good position. 3. Slight worsening lung aeration. Electronically Signed   By: Marijo Sanes M.D.   On: 04/19/2020 08:47   EEG adult  Result Date: 04/19/2020 Lora Havens, MD     04/19/2020  8:42 AM Patient Name: SIRA ADSIT MRN: [id number] Epilepsy Attending: Lora Havens Referring Physician/Provider: Dr Neva Seat Date: 04/09/2020 Duration: 20.33 mins Patient history: 59yo F s/p cardiac arrest on TTM. EEG to evaluate for seizure Level of alertness: comatose AEDs during EEG study: LEV Technical aspects: This EEG study was done with scalp electrodes positioned according to the 10-20 International system of electrode placement. Electrical activity was acquired at a sampling rate of _0  and reviewed with a high frequency filter of _1  and a low frequency filter of _2 . EEG data were recorded continuously and digitally stored. DESCRIPTION: EEG showed continuous generalized background  attenuation. Reactivity was not tested during this study. Hyperventilation and photic stimulation were not performed. ABNORMALITY - Background attenuation, generalized IMPRESSION: This study is suggestive of profound diffuse encephalopathy, non specific to etiology. No seizures or epileptiform discharges were seen throughout the recording. Priyanka Barbra Sarks   Overnight EEG with video  Result Date: 04/19/2020 Lora Havens, MD     04/20/2020 10:06 AM Patient Name: ZOLLIE CLEMENCE MRN: [id number] Epilepsy Attending: Lora Havens Referring Physician/Provider: Dr Neva Seat Duration: 04/17/2020 2050 to 04/19/2020 2050  Patient history: 59yo F s/p cardiac arrest on TTM. EEG to evaluate for seizure  Level of alertness: comatose  AEDs during EEG study: LEV  Technical aspects: This EEG study was done with scalp electrodes positioned according to the 10-20 International system of electrode placement. Electrical activity was acquired at a sampling rate of _3  and reviewed with a high frequency filter of _4  and a low frequency filter of _5 . EEG data were recorded continuously and digitally stored.  DESCRIPTION: EEG showed continuous generalized background attenuation. Reactivity was not tested during this study. Hyperventilation and photic stimulation were not performed.  ABNORMALITY - Background attenuation, generalized  IMPRESSION: This study is suggestive of profound diffuse encephalopathy, non specific to etiology. No seizures or epileptiform discharges were seen throughout the recording. Lora Havens   ECHOCARDIOGRAM COMPLETE  Result Date: 04/19/2020    ECHOCARDIOGRAM REPORT   Patient Name:   LEILIANA FOODY Date of Exam: 04/19/2020 Medical Rec #:  [id number]             Height:       65.0 in Accession #:    [id number]            Weight:       249.6 lb Date of Birth:  1961/06/19            BSA:          2.173 m Patient Age:    20 years              BP:           119/74 mmHg  Patient Gender: F                     HR:           102 bpm. Exam Location:  Inpatient Procedure: 2D Echo, Cardiac Doppler and Color Doppler Indications:    Cardiac arrest  History:        Patient has prior history of Echocardiogram examinations, most  recent 02/16/2020. Previous Myocardial Infarction, Stroke; Risk                 Factors:Dyslipidemia and Current Smoker. Substanace abuse.  Sonographer:    Mikki Santee RDCS (AE) Referring Phys: Lexa  1. Left ventricular ejection fraction, by estimation, is 55 to 60%. The left ventricle has normal function. The left ventricle has no regional wall motion abnormalities. There is mild concentric left ventricular hypertrophy. Left ventricular diastolic function could not be evaluated. There is the interventricular septum is flattened in systole and diastole, consistent with right ventricular pressure and volume overload.  2. RVSP underestimated in setting of severe TR. Right ventricular systolic function is severely reduced. The right ventricular size is severely enlarged. There is mildly elevated pulmonary artery systolic pressure. The estimated right ventricular systolic pressure is 78.2 mmHg.  3. Right atrial size was severely dilated.  4. Moderate pericardial effusion. The pericardial effusion is posterior to the left ventricle.  5. The mitral valve is grossly normal. Trivial mitral valve regurgitation. No evidence of mitral stenosis.  6. Tricuspid valve regurgitation is severe.  7. The aortic valve is tricuspid. Aortic valve regurgitation is not visualized. Mild aortic valve sclerosis is present, with no evidence of aortic valve stenosis.  8. The inferior vena cava is dilated in size with <50% respiratory variability, suggesting right atrial pressure of 15 mmHg. Comparison(s): Changes from prior study are noted. Moderate pericardial effusion now present. Severe RV dilation and severely reduced RV function remains.  Conclusion(s)/Recommendation(s): Findings consistent with Cor Pulmonale. FINDINGS  Left Ventricle: Left ventricular ejection fraction, by estimation, is 55 to 60%. The left ventricle has normal function. The left ventricle has no regional wall motion abnormalities. The left ventricular internal cavity size was small. There is mild concentric left ventricular hypertrophy. The interventricular septum is flattened in systole and diastole, consistent with right ventricular pressure and volume overload. Left ventricular diastolic function could not be evaluated due to atrial fibrillation. Left ventricular diastolic function could not be evaluated. Right Ventricle: RVSP underestimated in setting of severe TR. The right ventricular size is severely enlarged. No increase in right ventricular wall thickness. Right ventricular systolic function is severely reduced. There is mildly elevated pulmonary artery systolic pressure. The tricuspid regurgitant velocity is 2.71 m/s, and with an assumed right atrial pressure of 15 mmHg, the estimated right ventricular systolic pressure is 95.6 mmHg. Left Atrium: Left atrial size was normal in size. Right Atrium: Right atrial size was severely dilated. Pericardium: A moderately sized pericardial effusion is present. The pericardial effusion is posterior to the left ventricle. Presence of pericardial fat pad. Mitral Valve: The mitral valve is grossly normal. Trivial mitral valve regurgitation. No evidence of mitral valve stenosis. Tricuspid Valve: The tricuspid valve is grossly normal. Tricuspid valve regurgitation is severe. No evidence of tricuspid stenosis. The flow in the hepatic veins is reversed during ventricular systole. Aortic Valve: The aortic valve is tricuspid. . There is moderate thickening and moderate calcification of the aortic valve. Aortic valve regurgitation is not visualized. Mild aortic valve sclerosis is present, with no evidence of aortic valve stenosis. There is  moderate thickening of the aortic valve. There is moderate calcification of the aortic valve. Pulmonic Valve: The pulmonic valve was grossly normal. Pulmonic valve regurgitation is trivial. No evidence of pulmonic stenosis. Aorta: The aortic root and ascending aorta are structurally normal, with no evidence of dilitation. Venous: The inferior vena cava is dilated in size with less than 50% respiratory variability,  suggesting right atrial pressure of 15 mmHg. IAS/Shunts: There is left bowing of the interatrial septum, suggestive of elevated right atrial pressure. The atrial septum is grossly normal. Additional Comments: There is a small pleural effusion in the left lateral region.  LEFT VENTRICLE PLAX 2D LVIDd:         4.10 cm LVIDs:         2.95 cm LV PW:         1.20 cm LV IVS:        1.20 cm LVOT diam:     2.30 cm LV SV:         38 LV SV Index:   17 LVOT Area:     4.15 cm  LEFT ATRIUM             Index       RIGHT ATRIUM           Index LA diam:        3.40 cm 1.56 cm/m  RA Area:     42.60 cm LA Vol (A2C):   59.4 ml 27.33 ml/m RA Volume:   178.00 ml 81.90 ml/m LA Vol (A4C):   59.8 ml 27.52 ml/m LA Biplane Vol: 65.6 ml 30.18 ml/m  AORTIC VALVE LVOT Vmax:   52.00 cm/s LVOT Vmean:  33.600 cm/s LVOT VTI:    0.091 m  AORTA Ao Root diam: 3.60 cm TRICUSPID VALVE TR Peak grad:   29.4 mmHg TR Vmax:        271.00 cm/s  SHUNTS Systemic VTI:  0.09 m Systemic Diam: 2.30 cm Eleonore Chiquito MD Electronically signed by Eleonore Chiquito MD Signature Date/Time: 04/19/2020/12:02:30 PM    Final    US LIVER DOPPLER  Result Date: 04/19/2020 CLINICAL DATA:  Abnormal liver function tests. EXAM: DUPLEX ULTRASOUND OF LIVER TECHNIQUE: Color and duplex Doppler ultrasound was performed to evaluate the hepatic in-flow and out-flow vessels. COMPARISON:  Right upper quadrant abdominal ultrasound on 03/07/2020 FINDINGS: Portal Vein Velocities Due to inability to suspend breathing on a ventilator, the portal vein could not be accurately  sampled by duplex ultrasound. There clearly is color Doppler flow in the portal vein and flow is towards the liver. Hepatic Vein Velocities Right:  59 cm/sec Middle:  51 cm/sec Left:  49 cm/sec Hepatic vein waveforms appear normal. The intrahepatic IVC is patent. Hepatic Artery Velocity:  123 cm/sec Splenic Vein Velocity:  The splenic vein was not able to be sampled. Varices: None visualized. Ascites: A small amount of ascites was visualized in the peritoneal cavity. IMPRESSION: Limited study due to inability to accurately sample portal veins or splenic vein by duplex ultrasound due to respiratory motion. The portal vein is not occluded by color Doppler. Hepatic veins are patent and demonstrate normal waveforms and velocities. A small amount of ascites was identified. Electronically Signed   By: Aletta Edouard M.D.   On: 04/19/2020 16:25   US Abdomen Limited RUQ  Result Date: 04/19/2020 CLINICAL DATA:  Hyperbilirubinemia EXAM: ULTRASOUND ABDOMEN LIMITED RIGHT UPPER QUADRANT COMPARISON:  03/07/2020 FINDINGS: Gallbladder: Layering biliary sludge present within the gallbladder lumen. No shadowing gallstones or wall thickening visualized. No sonographic Murphy sign noted by sonographer. Common bile duct: Diameter: 4 mm Liver: No focal lesion identified. Within normal limits in parenchymal echogenicity. Portal vein is patent on color Doppler imaging with normal direction of blood flow towards the liver. Other: Small volume ascites. Partially visualized right pleural effusion. IMPRESSION: 1. Biliary sludge without evidence of cholecystitis. 2. Small volume  ascites. 3. Partially visualized right-sided pleural effusion. Electronically Signed   By: Davina Poke D.O.   On: 04/19/2020 16:23    Cardiac Studies   See echo report above.  Patient Profile     59 y.o. 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   Assessment & Plan    1. Paroxysmal atrial fibrillation with RVR: Currently in sinus rhythm on IV amiodarone. 2. Acute on chronic systolic and diastolic right ventricular failure: This is a very poor prognostic indicator.  Plan to continue support.  Manage hypoxia.  Volume loading to support systemic pressure. 3. 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      For questions or updates, please contact [place] Please consult [email or web address] for contact info under        Signed, Sinclair Grooms, MD  04/20/2020, 11:31 AM

## 2020-04-20 NOTE — Procedures (Addendum)
Patient Name:Tracy [name] EAK:[id number] Epilepsy Attending:Caldonia Leap Barbra Sarks Referring Physician/Provider:Dr Neva Seat Duration: 04/19/2020 2050 to 04/20/2020 2050  Patient history:58yo F s/p cardiac arrest on TTM. EEG to evaluate for seizure  Level of alertness:comatose  AEDs during EEG study:LEV  Technical aspects: This EEG study was done with scalp electrodes positioned according to the 10-20 International system of electrode placement. Electrical activity was acquired at a sampling rate of 500Hz  and reviewed with a high frequency filter of 70Hz  and a low frequency filter of 1Hz . EEG data were recorded continuously and digitally stored.  DESCRIPTION:EEG initially showed continuous generalized background attenuation. After around 1am on 04/20/2020, generalized sharply contoured epileptiform discharges were noted at 1-1.5Hz  as well as continuous generalized low amplitude 2-3hz  delta slowing. Reactivity was not tested during this study. Hyperventilation and photic stimulation were not performed.  Event button was pressed on 04/20/2020 at 0238 for facial twitching. Concomitant eeg before, during and after the event didn't show any eeg change to suggest seizure.   ABNORMALITY - Periodic epileptiform discharges, generalized  - Background attenuation, generalized  IMPRESSION: This study showed evidence of generalized epileptogenicity as well as profound diffuse encephalopathy, non specific to etiology but likely secondary to anoxic/hypoxic brain injury. No seizures were seen throughout the recording.  Event button was pressed on 04/20/2020 at 0238 for facial twitching without concomitant eeg change and therefore less likely to be epileptic. However, focal motor seizures may not been seen on eeg. Therefore clinical correlation is recommended.     Kharizma Lesnick Barbra Sarks

## 2020-04-20 NOTE — Progress Notes (Signed)
LTM maint complete - no skin breakdown under:  F7, F8, M1, M2. Rechecked  fp2

## 2020-04-21 ENCOUNTER — Inpatient Hospital Stay (HOSPITAL_COMMUNITY): Payer: Medicare Other

## 2020-04-21 ENCOUNTER — Ambulatory Visit (HOSPITAL_COMMUNITY): Payer: Medicare Other | Admitting: Physical Therapy

## 2020-04-21 DIAGNOSIS — Z515 Encounter for palliative care: Secondary | ICD-10-CM

## 2020-04-21 DIAGNOSIS — N184 Chronic kidney disease, stage 4 (severe): Secondary | ICD-10-CM

## 2020-04-21 DIAGNOSIS — G931 Anoxic brain damage, not elsewhere classified: Secondary | ICD-10-CM

## 2020-04-21 DIAGNOSIS — Z7189 Other specified counseling: Secondary | ICD-10-CM

## 2020-04-21 LAB — RENAL FUNCTION PANEL
Albumin: 2.3 g/dL — ABNORMAL LOW (ref 3.5–5.0)
Albumin: 2 g/dL — ABNORMAL LOW (ref 3.5–5.0)
Anion gap: 24 — ABNORMAL HIGH (ref 5–15)
Anion gap: 21 — ABNORMAL HIGH (ref 5–15)
BUN: 36 mg/dL — ABNORMAL HIGH (ref 6–20)
BUN: 31 mg/dL — ABNORMAL HIGH (ref 6–20)
CO2: 14 mmol/L — ABNORMAL LOW (ref 22–32)
CO2: 16 mmol/L — ABNORMAL LOW (ref 22–32)
Calcium: 9.2 mg/dL (ref 8.9–10.3)
Calcium: 8.6 mg/dL — ABNORMAL LOW (ref 8.9–10.3)
Chloride: 98 mmol/L (ref 98–111)
Chloride: 101 mmol/L (ref 98–111)
Creatinine, Ser: 2.72 mg/dL — ABNORMAL HIGH (ref 0.44–1.00)
Creatinine, Ser: 2.47 mg/dL — ABNORMAL HIGH (ref 0.44–1.00)
GFR calc Af Amer: 21 mL/min — ABNORMAL LOW (ref >60)
GFR calc Af Amer: 24 mL/min — ABNORMAL LOW (ref >60)
GFR calc non Af Amer: 18 mL/min — ABNORMAL LOW (ref >60)
GFR calc non Af Amer: 21 mL/min — ABNORMAL LOW (ref >60)
Glucose, Bld: 68 mg/dL — ABNORMAL LOW (ref 70–99)
Glucose, Bld: 151 mg/dL — ABNORMAL HIGH (ref 70–99)
Phosphorus: 6.5 mg/dL — ABNORMAL HIGH (ref 2.5–4.6)
Phosphorus: 5.9 mg/dL — ABNORMAL HIGH (ref 2.5–4.6)
Potassium: 5 mmol/L (ref 3.5–5.1)
Potassium: 4.7 mmol/L (ref 3.5–5.1)
Sodium: 136 mmol/L (ref 135–145)
Sodium: 138 mmol/L (ref 135–145)

## 2020-04-21 LAB — GLUCOSE, CAPILLARY
Glucose-Capillary: 110 mg/dL — ABNORMAL HIGH (ref 70–99)
Glucose-Capillary: 114 mg/dL — ABNORMAL HIGH (ref 70–99)
Glucose-Capillary: 116 mg/dL — ABNORMAL HIGH (ref 70–99)
Glucose-Capillary: 135 mg/dL — ABNORMAL HIGH (ref 70–99)
Glucose-Capillary: 137 mg/dL — ABNORMAL HIGH (ref 70–99)
Glucose-Capillary: 148 mg/dL — ABNORMAL HIGH (ref 70–99)
Glucose-Capillary: 159 mg/dL — ABNORMAL HIGH (ref 70–99)
Glucose-Capillary: 164 mg/dL — ABNORMAL HIGH (ref 70–99)
Glucose-Capillary: 172 mg/dL — ABNORMAL HIGH (ref 70–99)
Glucose-Capillary: 173 mg/dL — ABNORMAL HIGH (ref 70–99)
Glucose-Capillary: 68 mg/dL — ABNORMAL LOW (ref 70–99)
Glucose-Capillary: 69 mg/dL — ABNORMAL LOW (ref 70–99)
Glucose-Capillary: 88 mg/dL (ref 70–99)

## 2020-04-21 LAB — POCT I-STAT 7, (LYTES, BLD GAS, ICA,H+H)
Acid-base deficit: 14 mmol/L — ABNORMAL HIGH (ref 0.0–2.0)
Acid-base deficit: 12 mmol/L — ABNORMAL HIGH (ref 0.0–2.0)
Bicarbonate: 14 mmol/L — ABNORMAL LOW (ref 20.0–28.0)
Bicarbonate: 15.8 mmol/L — ABNORMAL LOW (ref 20.0–28.0)
Calcium, Ion: 1.15 mmol/L (ref 1.15–1.40)
Calcium, Ion: 1.11 mmol/L — ABNORMAL LOW (ref 1.15–1.40)
HCT: 45 % (ref 36.0–46.0)
HCT: 46 % (ref 36.0–46.0)
Hemoglobin: 15.3 g/dL — ABNORMAL HIGH (ref 12.0–15.0)
Hemoglobin: 15.6 g/dL — ABNORMAL HIGH (ref 12.0–15.0)
O2 Saturation: 99 %
O2 Saturation: 97 %
Patient temperature: 98
Patient temperature: 35.5
Potassium: 4.6 mmol/L (ref 3.5–5.1)
Potassium: 4.2 mmol/L (ref 3.5–5.1)
Sodium: 135 mmol/L (ref 135–145)
Sodium: 137 mmol/L (ref 135–145)
TCO2: 15 mmol/L — ABNORMAL LOW (ref 22–32)
TCO2: 17 mmol/L — ABNORMAL LOW (ref 22–32)
pCO2 arterial: 39.6 mmHg (ref 32.0–48.0)
pCO2 arterial: 37 mmHg (ref 32.0–48.0)
pH, Arterial: 7.155 — CL (ref 7.350–7.450)
pH, Arterial: 7.23 — ABNORMAL LOW (ref 7.350–7.450)
pO2, Arterial: 145 mmHg — ABNORMAL HIGH (ref 83.0–108.0)
pO2, Arterial: 100 mmHg (ref 83.0–108.0)

## 2020-04-21 LAB — HEPARIN LEVEL (UNFRACTIONATED): Heparin Unfractionated: 1.84 IU/mL — ABNORMAL HIGH (ref 0.30–0.70)

## 2020-04-21 LAB — HEPATIC FUNCTION PANEL
ALT: 66 U/L — ABNORMAL HIGH (ref 0–44)
AST: 126 U/L — ABNORMAL HIGH (ref 15–41)
Albumin: 2.2 g/dL — ABNORMAL LOW (ref 3.5–5.0)
Alkaline Phosphatase: 216 U/L — ABNORMAL HIGH (ref 38–126)
Bilirubin, Direct: 7.3 mg/dL — ABNORMAL HIGH (ref 0.0–0.2)
Indirect Bilirubin: 4.9 mg/dL — ABNORMAL HIGH (ref 0.3–0.9)
Total Bilirubin: 12.2 mg/dL — ABNORMAL HIGH (ref 0.3–1.2)
Total Protein: 6.5 g/dL (ref 6.5–8.1)

## 2020-04-21 LAB — CBC WITH DIFFERENTIAL/PLATELET
Abs Immature Granulocytes: 0.86 10*3/uL — ABNORMAL HIGH (ref 0.00–0.07)
Basophils Absolute: 0.1 10*3/uL (ref 0.0–0.1)
Basophils Relative: 1 %
Eosinophils Absolute: 0 10*3/uL (ref 0.0–0.5)
Eosinophils Relative: 0 %
HCT: 47.8 % — ABNORMAL HIGH (ref 36.0–46.0)
Hemoglobin: 12.8 g/dL (ref 12.0–15.0)
Immature Granulocytes: 3 %
Lymphocytes Relative: 9 %
Lymphs Abs: 2.4 10*3/uL (ref 0.7–4.0)
MCH: 30.8 pg (ref 26.0–34.0)
MCHC: 26.8 g/dL — ABNORMAL LOW (ref 30.0–36.0)
MCV: 114.9 fL — ABNORMAL HIGH (ref 80.0–100.0)
Monocytes Absolute: 0.9 10*3/uL (ref 0.1–1.0)
Monocytes Relative: 3 %
Neutro Abs: 23 10*3/uL — ABNORMAL HIGH (ref 1.7–7.7)
Neutrophils Relative %: 84 %
Platelets: 77 10*3/uL — ABNORMAL LOW (ref 150–400)
RBC: 4.16 MIL/uL (ref 3.87–5.11)
WBC: 27.3 10*3/uL — ABNORMAL HIGH (ref 4.0–10.5)
nRBC: 10.5 % — ABNORMAL HIGH (ref 0.0–0.2)

## 2020-04-21 LAB — AMMONIA: Ammonia: 43 umol/L — ABNORMAL HIGH (ref 9–35)

## 2020-04-21 LAB — CALCIUM, IONIZED: Calcium, Ionized, Serum: 4.2 mg/dL — ABNORMAL LOW (ref 4.5–5.6)

## 2020-04-21 LAB — FOLATE RBC
Folate, Hemolysate: 278 ng/mL
Folate, RBC: 711 ng/mL (ref >498)
Hematocrit: 39.1 % (ref 34.0–46.6)

## 2020-04-21 LAB — TRIGLYCERIDES: Triglycerides: 114 mg/dL (ref <150)

## 2020-04-21 LAB — PROTIME-INR
INR: 5.5 (ref 0.8–1.2)
INR: 11.3 (ref 0.8–1.2)
Prothrombin Time: 49.9 seconds — ABNORMAL HIGH (ref 11.4–15.2)
Prothrombin Time: 88.4 seconds — ABNORMAL HIGH (ref 11.4–15.2)

## 2020-04-21 LAB — PATHOLOGIST SMEAR REVIEW

## 2020-04-21 LAB — PROCALCITONIN: Procalcitonin: 9.03 ng/mL

## 2020-04-21 LAB — LACTIC ACID, PLASMA: Lactic Acid, Venous: >11 mmol/L (ref 0.5–1.9)

## 2020-04-21 LAB — MAGNESIUM: Magnesium: 2.5 mg/dL — ABNORMAL HIGH (ref 1.7–2.4)

## 2020-04-21 MED ORDER — NOREPINEPHRINE 16 MG/250ML-% IV SOLN
0.0000 ug/min | INTRAVENOUS | Status: DC
  Administered 2020-04-21: 40 ug/min via INTRAVENOUS
  Administered 2020-04-21: 16 ug/min via INTRAVENOUS
  Administered 2020-04-22 – 2020-04-24 (×7): 40 ug/min via INTRAVENOUS
  Filled 2020-04-21 (×10): qty 250

## 2020-04-21 MED ORDER — SODIUM BICARBONATE-DEXTROSE 150-5 MEQ/L-% IV SOLN
150.0000 meq | INTRAVENOUS | Status: DC
  Administered 2020-04-21: 150 meq via INTRAVENOUS
  Filled 2020-04-21: qty 1000

## 2020-04-21 MED ORDER — SODIUM CHLORIDE 0.9 % IV SOLN
2.0000 g | INTRAVENOUS | Status: DC
  Administered 2020-04-21 – 2020-04-23 (×3): 2 g via INTRAVENOUS
  Filled 2020-04-21: qty 20
  Filled 2020-04-21 (×3): qty 2

## 2020-04-21 MED ORDER — PROPOFOL 1000 MG/100ML IV EMUL
5.0000 ug/kg/min | INTRAVENOUS | Status: DC
  Administered 2020-04-21: 10 ug/kg/min via INTRAVENOUS
  Filled 2020-04-21: qty 100

## 2020-04-21 MED ORDER — FENTANYL BOLUS VIA INFUSION
50.0000 ug | INTRAVENOUS | Status: DC | PRN
  Administered 2020-04-22 (×4): 50 ug via INTRAVENOUS
  Filled 2020-04-21: qty 50

## 2020-04-21 MED ORDER — DEXTROSE 50 % IV SOLN
INTRAVENOUS | Status: AC
  Filled 2020-04-21: qty 50

## 2020-04-21 MED ORDER — HYDROCORTISONE NA SUCCINATE PF 100 MG IJ SOLR
50.0000 mg | Freq: Four times a day (QID) | INTRAMUSCULAR | Status: DC
  Administered 2020-04-21 – 2020-04-24 (×13): 50 mg via INTRAVENOUS
  Filled 2020-04-21 (×13): qty 2

## 2020-04-21 MED ORDER — HEPARIN BOLUS VIA INFUSION
5500.0000 [IU] | Freq: Once | INTRAVENOUS | Status: AC
  Administered 2020-04-21: 5500 [IU] via INTRAVENOUS
  Filled 2020-04-21: qty 5500

## 2020-04-21 MED ORDER — VASOPRESSIN 20 UNIT/ML IV SOLN
0.0300 [IU]/min | INTRAVENOUS | Status: DC
  Administered 2020-04-21 – 2020-04-24 (×3): 0.03 [IU]/min via INTRAVENOUS
  Filled 2020-04-21 (×4): qty 2

## 2020-04-21 MED ORDER — NOREPINEPHRINE 16 MG/250ML-% IV SOLN
2.0000 ug/min | INTRAVENOUS | Status: DC
  Administered 2020-04-21: 5 ug/min via INTRAVENOUS
  Filled 2020-04-21: qty 250

## 2020-04-21 MED ORDER — FENTANYL CITRATE (PF) 100 MCG/2ML IJ SOLN
50.0000 ug | Freq: Once | INTRAMUSCULAR | Status: AC
  Administered 2020-04-21: 50 ug via INTRAVENOUS

## 2020-04-21 MED ORDER — VITAMIN K1 10 MG/ML IJ SOLN
10.0000 mg | Freq: Once | INTRAVENOUS | Status: AC
  Administered 2020-04-21: 10 mg via INTRAVENOUS
  Filled 2020-04-21: qty 1

## 2020-04-21 MED ORDER — FENTANYL 2500MCG IN NS 250ML (10MCG/ML) PREMIX INFUSION
50.0000 ug/h | INTRAVENOUS | Status: DC
  Administered 2020-04-21: 50 ug/h via INTRAVENOUS
  Administered 2020-04-21: 200 ug/h via INTRAVENOUS
  Administered 2020-04-22: 125 ug/h via INTRAVENOUS
  Administered 2020-04-22: 02:00:00 200 ug/h via INTRAVENOUS
  Filled 2020-04-21 (×3): qty 250

## 2020-04-21 MED ORDER — NOREPINEPHRINE 4 MG/250ML-% IV SOLN: 0.0000 ug/min | INTRAVENOUS | Status: DC

## 2020-04-21 MED ORDER — SODIUM BICARBONATE-DEXTROSE 150-5 MEQ/L-% IV SOLN
150.0000 meq | INTRAVENOUS | Status: DC
  Administered 2020-04-21 – 2020-04-24 (×9): 150 meq via INTRAVENOUS
  Filled 2020-04-21 (×11): qty 1000

## 2020-04-21 MED ORDER — SODIUM BICARBONATE 8.4 % IV SOLN
INTRAVENOUS | Status: AC
  Administered 2020-04-21: 100 meq
  Filled 2020-04-21: qty 100

## 2020-04-21 MED ORDER — EPINEPHRINE 1 MG/10ML IJ SOSY
PREFILLED_SYRINGE | INTRAMUSCULAR | Status: AC
  Filled 2020-04-21: qty 10

## 2020-04-21 MED ORDER — HEPARIN (PORCINE) 25000 UT/250ML-% IV SOLN
1400.0000 [IU]/h | INTRAVENOUS | Status: DC
  Administered 2020-04-21: 1400 [IU]/h via INTRAVENOUS
  Filled 2020-04-21: qty 250

## 2020-04-21 NOTE — Progress Notes (Signed)
vLTM EEG complete. No skin breakdown 

## 2020-04-21 NOTE — Progress Notes (Signed)
Pt's daughter was bedside when I arrived. She was very tearful and vocal regarding her mother's condition. She questioned what she would do w/o her; she expressed how she had done everything; she said they didn't tell each other enough that they loved each other. I encouraged her to tell her and she embraced her mother and told her as she sobbed. She was able to talk to her mom and express her feelings. Pt's daughter is her primary caregiver. Pt's brother and gdaughter joined her daughter and offered additional support.  I continue to ck periodically w/family. Please page if assistance is needed prior to my next visit. [place], [facility]   04/21/20 2100  Clinical Encounter Type  Visited With Family

## 2020-04-21 NOTE — Progress Notes (Signed)
ANTICOAGULATION CONSULT NOTE - Initial Consult  Pharmacy Consult for Heparin Indication: pulmonary embolus  No Known Allergies  Patient Measurements: Height: 5\' 5"  (165.1 cm) Weight: 107.7 kg (237 lb 7 oz) IBW/kg (Calculated) : 57 Heparin Dosing Weight: 82.2 kg  Vital Signs: Temp: 96.6 F (35.9 C) (04/23 1100) Temp Source: Core (04/23 0400) BP: 93/75 (04/23 1100) Pulse Rate: 123 (04/23 0930)  Labs: Recent Labs    04/16/2020 1138 04/22/2020 1346 04/23/2020 1404 04/17/2020 1444 04/19/20 0313 04/19/20 0508 04/19/20 2032 04/20/20 0007 04/20/20 0442 04/20/20 0458 04/20/20 0726 04/20/20 1535 04/20/20 1555 04/20/20 1555 04/21/20 0320 04/21/20 0516  HGB  --   --   --    < > 11.1*   < >  --  12.2  --    < >  --   --  12.5   < > 12.8 15.3*  HCT  --   --   --    < > 39.3   < >  --  43.6  --    < >  --   --  45.2  --  47.8* 45.0  PLT  --   --   --   --  58*   < >  --  76*  --   --   --   --  80*  --  77*  --   APTT  --  34  --   --   --   --   --   --   --   --   --   --   --   --   --   --   LABPROT  --  34.1*  --    < > 29.9*  --   --   --   --   --  30.6*  --   --   --  49.9*  --   INR  --  3.4*  --    < > 2.9*  --   --   --   --   --  2.9*  --   --   --  5.5*  --   CREATININE   < >  --  3.56*   < > 3.86*   < >   < >  --  3.15*  --   --  2.97*  --   --  2.72*  --   TROPONINIHS  --   --  57*  --   --   --   --   --   --   --   --   --   --   --   --   --    < > = values in this interval not displayed.    Estimated Creatinine Clearance: 27.2 mL/min (A) (by C-G formula based on SCr of 2.72 mg/dL (H)).   Medical History: Past Medical History:  Diagnosis Date  . Acute ischemic stroke ([place]) 07/03/2012  . Crack cocaine use   . Diabetes ([place])   . Diabetes mellitus ([place])   . DVT (deep venous thrombosis) ([place])   . Hemiparesis, acute ([place]) 07/04/2012  . Hypertension   . LVH (left ventricular hypertrophy) 07/04/2012   Ejection fraction 60%.  . Myocardial infarct, old   .  Thrombocytopenia ([place]) 07/03/2012  . Tobacco abuse    Assessment: 59 y.o presenting after respiratory arrest leading to cardiopulmonary arrest (day 3 of hospitalization). Patient on apixaban 5 mg BID prior to admission for afib.  Anticoagulation has held since admission due to concern of bleeding; however, now there is no concern for bleeding and Hbg has remained stable. Now concerned for PE based on clinical worsening.   Patient since admission Tbili, AST/ALT, and INR has been on a up trend supporting acute on chronic hepatic failure. Today INR was 5.5. Ordered Vitamin K 10 mg x1 dose. No evidence or concern for bleeding per MD and RN.   Given new concern for PE, full dose Heparin bolus and continuous heparin infusion was requested.   Goal of Therapy:  Heparin level 0.3-0.7 units/ml Monitor platelets by anticoagulation protocol: Yes   Plan:  Give heparin bolus of 5,500 units x1; then start heparin gtt at 1400 units/hr Obtain 8 hour heparin level Monitor daily heparin level, CBC Will closely monitor for s/sx of bleeding   Acey Lav, PharmD  PGY1 Acute Care Pharmacy Resident 04/21/2020,11:13 AM

## 2020-04-21 NOTE — Progress Notes (Signed)
CRITICAL VALUE ALERT  Critical Value:  INR 11.3  Date & Time Notied: 04/21/20 2200  Provider Notified: Warren Lacy MD  Orders Received/Actions taken: Stop heparin infusion

## 2020-04-21 NOTE — Consult Note (Signed)
NEURO HOSPITALIST CONSULT NOTE   Requesting physician: Dr. Lamonte Sakai Reason for Consult: s/p cardiac arrest   History obtained from:  Chart review  HPI:                                                                                                                                         Tracy [name] is an 59 y.o. 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  Patient was admitted in February and March for A/C HF.  4/20 she came in after she  slumped over in her chair at home. CPR was given at home and then EMS started ACLS for about 5 minutes. Intubated with king airway in the field.  4/20: admission s/p cardiac arrest, UDS: + opiates, UA: + UTI, BUN: 64, creatinine: 3.50 BG: 120 CTH: no hemorrhage. 4/22: EEG showed generalized epileptogenicity;secondary to hypoxic injury/cefepime use; no seizures were seen.  4/23 neurology consulted ammonia: 43, BUN:31 creatinine: 2.47 On Pressors BP : 75/63 with progressively increasing pressor demand throughout the day.  Past Medical History:  Diagnosis Date  . Acute ischemic stroke ([place]) 07/03/2012  . Crack cocaine use   . Diabetes ([place])   . Diabetes mellitus ([place])   . DVT (deep venous thrombosis) ([place])   . Hemiparesis, acute ([place]) 07/04/2012  . Hypertension   . LVH (left ventricular hypertrophy) 07/04/2012   Ejection fraction 60%.  . Myocardial infarct, old   . Thrombocytopenia ([place]) 07/03/2012  . Tobacco abuse     Past Surgical History:  Procedure Laterality Date  . ABDOMINAL HYSTERECTOMY    . HERNIA REPAIR      Family History  Problem Relation Age of Onset  . Heart disease Mother   . Diabetes Mother   . Stroke Mother   . Hypertension Mother   . Hypertension Father   . Heart disease Father   . Diabetes Brother     Social History:  reports that she has been smoking cigarettes. She has been  smoking about 1.00 pack per day. She has never used smokeless tobacco. She reports that she does not drink alcohol or use drugs.  No Known Allergies  MEDICATIONS:  Scheduled: . budesonide (PULMICORT) nebulizer solution  0.5 mg Nebulization BID  . chlorhexidine gluconate (MEDLINE KIT)  15 mL Mouth Rinse BID  . Chlorhexidine Gluconate Cloth  6 each Topical Daily  . EPINEPHrine      . hydrocortisone sod succinate (SOLU-CORTEF) inj  50 mg Intravenous Q6H  . ipratropium-albuterol  3 mL Nebulization Q6H  . mouth rinse  15 mL Mouth Rinse 10 times per day  . pantoprazole (PROTONIX) IV  40 mg Intravenous QHS   Continuous: .  prismasol BGK 4/2.5 500 mL/hr at 04/21/20 1005  .  prismasol BGK 4/2.5 200 mL/hr at 04/21/20 1005  . sodium chloride    . amiodarone 30 mg/hr (04/21/20 1600)  . cefTRIAXone (ROCEPHIN)  IV    . dextrose 5 mL/hr at 04/21/20 1600  . feeding supplement (VITAL HIGH PROTEIN) Stopped (04/21/20 0759)  . fentaNYL infusion INTRAVENOUS 100 mcg/hr (04/21/20 1600)  . heparin 1,400 Units/hr (04/21/20 1600)  . levETIRAcetam 500 mg (04/21/20 1642)  . norepinephrine (LEVOPHED) Adult infusion 25 mcg/min (04/21/20 1600)  . prismasol BGK 4/2.5 1,000 mL/hr at 04/21/20 1521  . sodium bicarbonate 150 mEq in dextrose 5% 1000 mL 150 mEq (04/21/20 1620)  . vasopressin (PITRESSIN) infusion - *FOR SHOCK* 0.03 Units/min (04/21/20 1600)   XHB:ZJIRCVELFY tears, docusate sodium, fentaNYL, heparin, heparin, midazolam, polyethylene glycol   ROS:                                                                                                                                       History unobtainable from patient due to mental status  Blood pressure (!) 80/63, pulse (!) 104, temperature (!) 96.8 F (36 C), resp. rate (!) 31, height '5\' 5"'$  (1.651 m), weight 107.7 kg, SpO2 98 %. General  Examination:                                                                                                      Physical Exam  Constitutional: Appears well-developed and well-nourished.  Eyes: Normal external eye and conjunctiva. HENT: Normocephalic, no lesions, without obvious abnormality.   Musculoskeletal-generalized pitting edema Cardiovascular: Normal rate and regular rhythm.  Respiratory: Vented GI: Soft.  No distension. There is no tenderness.  Skin: WDI  Neurological Examination Mental status: On fentanyl for sedation at 100 an hour, intubated. Does not open eyes to voice or noxious stimulation. Breathing with the ventilator No spontaneous movements Absent corneal on the left, question trace corneal on the right.  Pupils extremely sluggishly reactive-2  mm round. Disconjugate gaze with right exotropia. No movement to noxious stimulation    Lab Results: Basic Metabolic Panel: Recent Labs  Lab 04/19/20 0313 04/19/20 0508 04/19/20 2032 04/19/20 2032 04/20/20 0442 04/20/20 0458 04/20/20 1535 04/21/20 0320 04/21/20 0516 04/21/20 1329 04/21/20 1510  NA 137   < > 137   < > 137   < > 134* 136 135 137 138  K 4.1   < > 4.5   < > 4.5   < > 4.7 5.0 4.6 4.2 4.7  CL 100   < > 98  --  99  --  100 98  --   --  101  CO2 23   < > 20*  --  19*  --  18* 14*  --   --  16*  GLUCOSE 211*   < > 90  --  71  --  290* 68*  --   --  151*  BUN 64*   < > 62*  --  51*  --  42* 36*  --   --  31*  CREATININE 3.86*   < > 3.62*  --  3.15*  --  2.97* 2.72*  --   --  2.47*  CALCIUM 8.0*   < > 8.8*   < > 8.5*   < > 8.4* 9.2  --   --  8.6*  MG 2.1  --   --   --  2.3  --   --  2.5*  --   --   --   PHOS 7.1*   < > 7.2*  --  6.1*  --  6.0* 6.5*  --   --  5.9*   < > = values in this interval not displayed.    CBC: Recent Labs  Lab 04/13/2020 1044 04/17/2020 1058 04/19/20 0313 04/19/20 0508 04/20/20 0007 04/20/20 0007 04/20/20 0458 04/20/20 1555 04/21/20 0320 04/21/20 0516 04/21/20 1329  WBC  18.1*  --  16.4*  --  19.7*  --   --  21.4* 27.3*  --   --   NEUTROABS 13.5*  --   --   --  17.2*  --   --  19.7* 23.0*  --   --   HGB 11.6*   < > 11.1*   < > 12.2   < > 14.6 12.5 12.8 15.3* 15.6*  HCT 42.4   < > 39.3   < > 43.6   < > 43.0 45.2 47.8* 45.0 46.0  MCV 116.2*  --  110.7*  --  109.8*  --   --  113.3* 114.9*  --   --   PLT 74*  --  58*  --  76*  --   --  80* 77*  --   --    < > = values in this interval not displayed.     Lipid Panel: Recent Labs  Lab 04/21/20 0320  TRIG 114      EEG 04/20/20 IMPRESSION: This studyshowed evidence of generalized epileptogenicity as well asprofound diffuse encephalopathy, non specific to etiologybut likely secondary to anoxic/hypoxic brain injury, cefepime use.No definite seizures were seen throughout the recording.  EEG appears to be worsening compared to yesterday. Imaging: CT ABDOMEN PELVIS WO CONTRAST  Result Date: 04/21/2020 CLINICAL DATA:  Septic shock. Worsening lactic acidosis. Concern for bowel ischemia. EXAM: CT ABDOMEN AND PELVIS WITHOUT CONTRAST TECHNIQUE: Multidetector CT imaging of the abdomen and pelvis was performed following the standard protocol without IV contrast. COMPARISON:  None.  FINDINGS: Lower chest: Mild basilar atelectasis. Hepatobiliary: No focal hepatic lesion on noncontrast exam Pancreas: No pancreatic inflammation identified. Pancreatic atrophy. No duct dilatation or inflammation. Spleen: Normal spleen Adrenals/urinary tract: Adrenal glands are poorly evaluated. Kidneys grossly normal noncontrast exam. Ureters bladder grossly normal. Foley catheter within the bladder. Stomach/Bowel: NG tube in stomach. Duodenum small-bowel appear normal. The small bowel is chronic collapse. No evidence of pneumatosis. The ascending colon is collapsed. Transverse colon descending colon normal. Rectum normal. No pneumatosis. No portal venous gas. Vascular/Lymphatic: Scattered intimal calcifications of the aorta. No aneurysm. LEFT  internal iliac artery is dilated 1.6 cm Reproductive: Post hysterectomy anatomy ovaries not appreciated Other: Small amount free fluid in the anterior pelvis and lower abdominal mesentery (image 61/3). There is anasarca of the soft tissues. Musculoskeletal: No aggressive osseous lesion. Increased density of the vertebral bodies suggest medical renal disease. IMPRESSION: 1. No evidence of bowel ischemia on noncontrast CT. No pneumatosis, portal venous gas or bowel distension. The bowel is predominantly collapsed. NG tube in stomach 2. No evidence of infection in the abdomen pelvis on noncontrast exam. 3. Small amount of intraperitoneal free fluid is present in the lower pelvis and mesentery. 4. Anasarca soft tissues. 5. Vertebral body sclerosis suggests medical renal disease. Electronically Signed   By: Suzy Bouchard M.D.   On: 04/21/2020 09:49   DG Chest Port 1 View  Result Date: 04/21/2020 CLINICAL DATA:  Acute respiratory failure. EXAM: PORTABLE CHEST 1 VIEW COMPARISON:  Radiograph 04/19/2020 FINDINGS: Borderline low positioning of the endotracheal tube 14 mm from the carina. Enteric tube in place tip below the diaphragm not included in the field of view. Right internal jugular central venous catheter tip projects over the upper SVC. Stable cardiomegaly. Aortic atherosclerosis. Improving left upper lobe aeration. There are fine reticular opacities throughout both lungs. Small pleural effusions. No pneumothorax. IMPRESSION: 1. Borderline low positioning of the endotracheal tube 14 mm from the carina. 2. Improving left upper lobe aeration. 3. Stable cardiomegaly. Small pleural effusions. Fine reticular opacities throughout both lungs may represent edema. Electronically Signed   By: Keith Rake M.D.   On: 04/21/2020 03:02   DG CHEST PORT 1 VIEW  Result Date: 04/19/2020 CLINICAL DATA:  59 year old female with central line placement. EXAM: PORTABLE CHEST 1 VIEW COMPARISON:  Earlier radiograph dated  04/19/2020. FINDINGS: Right IJ central venous line with tip over central SVC. No pneumothorax. Endotracheal tube remains above the carina. No significant interval change in the appearance of the lungs or cardiomediastinal silhouette since the earlier radiograph. IMPRESSION: Right IJ central venous line with tip over central SVC. No pneumothorax. Electronically Signed   By: Anner Crete M.D.   On: 04/19/2020 17:13   Laurey Morale, MSN, NP-C [facility] [phone]  Attending neurologist's note to follow  Assessment: Tracy [name] is an 59 y.o. 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  On exam patient is not breathing above the vent. PERRL sluggishly reactive, disconjugate gaze, no corneal reflexes. Generalized pitting edema. On pressors with BP SBP 75.  She is not on much sedation to explain her exam. I suspect given her EEG findings and her clinical exam, that she has sustained significant hypoxic/anoxic brain damage. Repeat head CT but preferably MRI would give a better picture of the brain to assess this damage but she at this time is very unstable from a hemodynamic standpoint to  be taken down. She also continues to have progressive lactic acidosis and no source is being currently worked up. Return of the pictures consistent with multiorgan failure including hypoxic/anoxic brain injury and I suspect that she would have a rather poor outcome.  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 Given her poor exam, I would not start her on any antiepileptics because EEG findings do not look like features consistent with  subclinical status but rather are suggestive of global anoxic brain injury. I will follow up with you again tomorrow to see if there is any change in exam. In the meantime supportive care per primary team should continue as planned. I have discussed my plan with Dr. Lamonte Sakai.  04/21/2020, 4:37 PM  Attending Neurohospitalist Addendum Patient seen and examined with APP/Resident. Agree with the history and physical as documented above. Agree with the plan as documented, which I helped formulate. I have independently reviewed the chart, obtained history, review of systems and examined the patient.I have personally reviewed pertinent head/neck/spine imaging (CT/MRI). Please feel free to call with any questions. --- Amie Portland, MD [facility] Pager: [phone]  If 7pm to 7am, please call on call as listed on AMION.   CRITICAL CARE ATTESTATION Performed by: Amie Portland, MD Total critical care time: 37 minutes Critical care time was exclusive of separately billable procedures and treating other patients and/or supervising APPs/Residents/Students Critical care was necessary to treat or prevent imminent or life-threatening deterioration due to hypoxic/anoxic brain injury status post cardiac arrest, severe toxic metabolic encephalopathy This patient is critically ill and at significant risk for neurological worsening and/or death and care requires constant monitoring. Critical care was time spent personally by me on the following activities: development of treatment plan with patient and/or surrogate as well as nursing, discussions with consultants, evaluation of patient's response to treatment, examination of patient, obtaining history from patient or surrogate, ordering and performing treatments and interventions, ordering and review of laboratory studies, ordering and review of radiographic studies, pulse oximetry, re-evaluation of patient's condition, participation in  multidisciplinary rounds and medical decision making of high complexity in the care of this patient.

## 2020-04-21 NOTE — Consult Note (Deleted)
Dear Doctor:This patient has been identified as a candidate for recommendation of a CVC for the following reason (s): IV therapy over 48 hours and drug extravasation potential with tissue necrosis (KCL, Dilantin, Dopamine, CaCl, MgSO4, chemo vesicant)   Thank you for supporting the early vascular access assessment program.

## 2020-04-21 NOTE — Progress Notes (Signed)
   The chart has been carefully reviewed including the most recent note by Dr. Lamonte Sakai.  She is currently in A. fib with controlled rate on IV amiodarone.  Agree that overall prognosis appears poor.  No new recommendations.

## 2020-04-21 NOTE — Progress Notes (Signed)
[place] Progress Note Patient Name: Tracy [name] DOB: [birth year] MRN: [id number]   Date of Service  04/21/2020  HPI/Events of Note  Patient with INR of 11.3, no overt bleeding.  eICU Interventions  Heparin infusion discontinued.        Kerry Kass Leita Lindbloom 04/21/2020, 11:13 PM

## 2020-04-21 NOTE — Progress Notes (Signed)
[place] Progress Note Patient Name: LEVETA WAHAB DOB: 1961-04-13 MRN: [id number]   Date of Service  04/21/2020  HPI/Events of Note  Sub-optimal sedation and ventilator dyssynchrony. Pt is only on PRN Fentanyl and Versed but it's  Inadequate, Pt has not had a CXR in several days.  eICU Interventions  Propofol infusion, Portable CXR        Frederik Pear 04/21/2020, 2:33 AM

## 2020-04-21 NOTE — Progress Notes (Signed)
ANTICOAGULATION CONSULT NOTE - Initial Consult  Pharmacy Consult for Heparin Indication: pulmonary embolus  No Known Allergies  Patient Measurements: Height: 5\' 5"  (165.1 cm) Weight: 107.7 kg (237 lb 7 oz) IBW/kg (Calculated) : 57 Heparin Dosing Weight: 82.2 kg  Vital Signs: Temp: 98.2 F (36.8 C) (04/23 2215) Temp Source: Bladder (04/23 2000) BP: 72/60 (04/23 2215) Pulse Rate: 107 (04/23 2215)  Labs: Recent Labs    04/20/20 0007 04/20/20 0442 04/20/20 0726 04/20/20 1535 04/20/20 1555 04/20/20 1555 04/21/20 0320 04/21/20 0320 04/21/20 0516 04/21/20 0516 04/21/20 1329 04/21/20 1510 04/21/20 2015  HGB   < >  --   --   --  12.5   < > 12.8   < > 15.3*   < > 15.6*  --  12.3  HCT   < >  --   --   --  45.2   < > 47.8*   < > 45.0  --  46.0  --  46.1*  PLT   < >  --   --   --  80*  --  77*  --   --   --   --   --  53*  LABPROT  --   --  30.6*  --   --   --  49.9*  --   --   --   --   --  88.4*  INR  --   --  2.9*  --   --   --  5.5*  --   --   --   --   --  11.3*  HEPARINUNFRC  --   --   --   --   --   --   --   --   --   --   --   --  1.84*  CREATININE  --    < >  --  2.97*  --   --  2.72*  --   --   --   --  2.47*  --    < > = values in this interval not displayed.    Estimated Creatinine Clearance: 29.9 mL/min (A) (by C-G formula based on SCr of 2.47 mg/dL (H)).  Assessment: 59 y.o presenting after respiratory arrest leading to cardiopulmonary arrest (day 3 of hospitalization). Patient on apixaban 5 mg BID prior to admission for afib. Anticoagulation has held since admission due to concern of bleeding; however, now there is no concern for bleeding and Hbg has remained stable. Now concerned for PE based on clinical worsening.   Patient since admission Tbili, AST/ALT, and INR has been on a up trend supporting acute on chronic hepatic failure. Today INR was 5.5. Ordered Vitamin K 10 mg x1 dose. No evidence or concern for bleeding per MD and RN before heparin initiation    Given new concern for PE, full dose Heparin bolus and continuous heparin infusion was requested.   Initial hep lvl resulted as 1.84 - drawn from opposite side of body of infusion so accurate. Also INR up to 11.3  No bleeding per RN  Goal of Therapy:  Heparin level 0.3-0.7 units/ml Monitor platelets by anticoagulation protocol: Yes   Plan:  Dc heparin per elink INR in am - f/u with md about need to resume  Barth Kirks, PharmD, BCPS, BCCCP Clinical Pharmacist [phone]  Please check AMION for all [place] numbers  04/21/2020 10:23 PM

## 2020-04-21 NOTE — Progress Notes (Signed)
Patient's systolic BP is in the 46'F. CRRT's fluid removal rate is now set to 31ml/hr. Currently on 43mcg of Levophed. ABG was drawn. Paged Elink regarding results as well as a critical INR of 5.5. Will continue to monitor

## 2020-04-21 NOTE — Procedures (Addendum)
Patient Name:Tracy [name] IDK:[id number] Epilepsy Attending:Iona Stay Barbra Sarks Referring Physician/Provider:Dr Neva Seat Duration:04/20/2020 2050 to 4/23/20211001  Patient history:58yo F s/p cardiac arrest on TTM. EEG to evaluate for seizure  Level of alertness:comatose  AEDs during EEG study:LEV  Technical aspects: This EEG study was done with scalp electrodes positioned according to the 10-20 International system of electrode placement. Electrical activity was acquired at a sampling rate of 500Hz  and reviewed with a high frequency filter of 70Hz  and a low frequency filter of 1Hz . EEG data were recorded continuously and digitally stored.  DESCRIPTION:EEG initially showed  generalized sharply contoured epileptiform discharges were noted at 1.5-2Hz  as well as continuous generalized low amplitude 2-3hz  delta slowing. Reactivity was not tested during this study. Hyperventilation and photic stimulation were not performed.  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 No definite seizures were seen throughout the recording.  EEG appears to be worsening compared to yesterday.

## 2020-04-21 NOTE — Progress Notes (Signed)
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 Recently admited for A/C heart failue in Feb and March of 2021 due to fluid and dietary indiscretion.   History of present illness    Not fully recovered physically after recent admission. Reported SOB. Hemoptysis yesterday x1 and x2-3 today. Was about to go see her orthopedic doctor when she slumped to chair at home, move to floor and 911 was called received bystander CPR for several minutes; pulse represent so this was stopped for about 1 minute during which EMS arrive and found her pulseless and apneic. ACLS for about 5 min. EPI x5, BICARB x1, Defib x1. King airway in the field, intubated in ED. Suspected to be respiratory driven with COPD and suspected OSA, worsening RHF.   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 Dense patchy opacity and consolidation in the posterior half of the left lung involving the left lower lobe and left upper lobe, compatible with pneumonia and atelectasis. 2. Milder patchy opacity in the right lung, primarily in the dependent right lower lobe, with underlying cylindrical bronchiectasis. This could be chronic or represent some superimposed infection. 3. Mild patchy ground-glass opacity in both lungs, compatible with mild interstitial pulmonary  edema/inflammation. 4. Small bilateral pleural effusions. 5. Small amount of free peritoneal fluid. 6. Multiple bilateral thyroid nodules, largest on the right measuring 2.5 cm in maximum diameter. Recommend thyroid US (ref: J Am Coll Radiol. 2015 Feb;12(2): 143-50). 7. Calcific coronary artery and aortic atherosclerosis.  CT Head (4/20) Negative for acute intracranial abnormalities   TTE (4/21) 1. Left ventricular ejection fraction, by estimation, is 55 to 60%. The  left ventricle has normal function. The left ventricle has no regional  wall motion abnormalities. There is mild concentric left ventricular  hypertrophy. Left ventricular diastolic  function could not be evaluated. There is the interventricular septum is  flattened in systole and diastole, consistent with right ventricular  pressure and volume overload.  2. RVSP underestimated in setting of severe TR. Right ventricular  systolic function is severely reduced. The right ventricular size is  severely enlarged. There is mildly elevated pulmonary artery systolic  pressure. The estimated right ventricular  systolic pressure is 46.6 mmHg.  3. Right atrial size was severely dilated.  4. Moderate pericardial effusion. The pericardial effusion is posterior  to the left ventricle.  5. The mitral valve is grossly normal. Trivial mitral valve  regurgitation. No evidence of mitral stenosis.  6. Tricuspid valve regurgitation is severe.  7. The aortic valve is tricuspid. Aortic valve regurgitation is not  visualized. Mild aortic valve sclerosis is present, with no evidence of  aortic valve stenosis.  8. The inferior vena cava is dilated in size with <50% respiratory  variability, suggesting right atrial pressure of 15 mmHg.   Micro Data:  4/20 Blood Cx: 1/4 GNR,  species not on reflex 4/20 Urine Cx: E. coli, 40,000, pan-sensitive  4/20 Resp Cx: Pending   Antimicrobials:  Vanc 4/20 - 4/21 Cefepime 4/20 >>  Interim  history/subjective:   Overnight events include increasing lactic acid without known etiology at this time, increasing evidence of liver failure with INR and LFTs rising. Due to hypotension, levophed has been increased. A bicarb gtt was started for acidosis, as well as Propofol gtt for vent dyssynchrony. Patient continues to have hypoglycemia.   On examination, patient is sedated and unresponsive to voice or noxious stimuli.   Objective   Blood pressure 93/70, pulse (!) 106, temperature 98.8 F (37.1 C), temperature source Core, resp. rate (!) 22, height 5\' 5"  (1.651 m), weight 107.7 kg, SpO2 98 %. CVP:  [15 mmHg-18 mmHg] 15 mmHg  Vent Mode: PRVC FiO2 (%):  [40 %-50 %] 40 % Set Rate:  [20 bmp] 20 bmp Vt Set:  [450 mL] 450 mL PEEP:  [10 cmH20] 10 cmH20 Plateau Pressure:  [18 cmH20-27 cmH20] 27 cmH20   Intake/Output Summary (Last 24 hours) at 04/21/2020 0631 Last data filed at 04/21/2020 0600 Gross per 24 hour  Intake 3500.47 ml  Output 6020 ml  Net -2519.53 ml   Filed Weights   04/19/20 0600 04/20/20 0224 04/21/20 0344  Weight: 113.2 kg 111 kg 107.7 kg   Physical Exam Vitals and nursing note reviewed.  Constitutional:      Appearance: She is obese. She is toxic-appearing. She is not diaphoretic.     Interventions: She is intubated.     Comments: Sedated  HENT:     Head: Normocephalic and atraumatic.  Eyes:     General: Scleral icterus present.  Cardiovascular:     Rate and Rhythm: Regular rhythm. Tachycardia present.     Heart sounds: No murmur.     Comments: Diffuse profound anasarca Pulmonary:     Effort: Tachypnea present. She is intubated.     Breath sounds: Decreased breath sounds present. No wheezing or rales.  Abdominal:     General: Bowel sounds are decreased.     Palpations: Abdomen is soft.  Musculoskeletal:     Right lower leg: 2+ Pitting Edema present.     Left lower leg: 2+ Pitting Edema present.  Skin:    General: Skin is cool.     Findings: No erythema,  lesion or rash.  Neurological:     Comments: Unresponsive to noxious stimuli in all 4 extremities. Gag and corneal reflex not intact. Pupils are sluggish and minimally reactive.    Resolved Hospital Problem list     Assessment & Plan:   Acute Hypoxic Respiratory Failure / Chronic Respiratory Failure S/P V. Fib Cardiac Arrest:  Short duration of CPR with immediate bystander CPR initiated.   High suspicion for hypoxia induced arrest, as patient has multiple reasons for worsening hypoxia, including COPD with likely OSA, HFpEF with volume over load and known severe right heart failure. Her blood cultures and urine cultures are both showing E. Coli that may have caused a respiratory insult as well. She is receiving treatment for each of these etiologies, as listed below.  P:  - Cardiology consulted - Rewarming complete - Mechanical Ventilation, not ready for SBT - Increase RR to 30.  - VAP Protocol - Switch Propofol to Fentanyl due to new shock liver  # Acute Encephalopathy  Too early to evaluate long-term neurological stand point but EEG has not shown any seizures. Will consider MRI to check for anoxic brain  injury, although less likely given her short course of CPR. EEG continues to show severe diffuse encephalopathy with some periodic epileptiform discharges.  P:  - Neurology consultation likely today, pending CT abdomen/pelvis results - Continue continuous EEG - Continue Kepra, Versed PRN - Daily exam with interruption of continuous sedation   # Shock, Cardiogenic + Septic  Lactic acid initially >11 and trended down for the first 48 hours of admission. Now, her lactic acid has been > 11 overnight. Increasing pressor requirements.  Procal is trending down. May be secondary to continue cariogenic shock causing ischemic bowel. Unfortunately, she would unlikely be a candidate for surgery. Will discuss this with family today.  P:  - Continue Levophed to maintain MAP > 65 - Consider  adding Vasopressin 0.03 - Trend lactic acid  - Trend CVP, q shift - Obtain CT A/P STAT   # E. Coli Bacteremia  Urine cultures shows E. Coli that is pan-sensitive with blood cultures growing 1/4 GNR. The bottles did not have optimal volume and I suspect this is why speciation of blood cultures were unsuccessful. She has been receiving appropriate treatment with broad spectrum antibiotics.  P:  - Continue Cefepime   # HFpEF with severe right HF TTE during this admission showed EF is unchanged, with no LV wall motion abnormality, however she does have interventricular septum flattening, as her RV function is very severely reduced with significantly increased pressure with volume overload. P:  - Cardiology consulted - Continue CRRT  # COPD  PFTs are not available in the chart, nor any pulmonology visits. At home, she is on 3L [place], Symbicort BID, and Albuterol PRN.  P: - Duoneb QID - Pulmicort BID  # Acute on Chronic CKD III:  Cr 3.5 (Baseline 2). Likely ATN. Creatinine improving after starting CRRT. Still no urine output though.  P:  - Nephrology following, we appreciate their recommendations  - CRRT per Nephrology  - Monitor UOP and renal function - Replete e-lytes as need - Avoid nephrotoxic agents  # Paroxsymal A-Fib:  On Diltiazem and Eliquis at home. During this admission, she has remained in atrial fibrillation with no evidence of RVR at this time. Held off on Diltiazem given her soft BPs but have started Amiodarone yesterday due to rising HR. HR much improved today.  P:  - Holding home Eliquis and Diltiazem - Amiodarone gtt   # Shock Liver # Mixed Hyperbilirubinemia # Elevated Transaminases (40s-60s) # Elevated INR Initially secondary to hepatic congestion however now patient has developed shock liver with rising LFTs, INR of 5.5.  P:  - Follow INR, LFTs, T. Bili - One time dose of Vitamin K 10mg    # Chronic Thrombocytopenia (since 2009)  Down-trending since February,  lowest recorded on 4/21 at 40. Low suspicion for hemolytic syndromes in light of hyperbilirubinemia has both are chronic. She did have some upper GI bleeding on admission but this has stopped. Platelets stable today.  P:  - Trend platelets - Monitor for signs of additional active bleeding  # Chronic Macrocytic Anemia  Stable and unchanged. B12 elevated, folate still pending.  P:  - Continue to monitor  # Hypoglycemia  # Hx of T2DM P:  - Titrate D10 as needed to maintain CBG > 70.   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 Holding given change in renal function and elevated INR # Hx of CVA: Holding home Statin  Best practice:  Diet: NPO Pain/Anxiety/Delirium protocol (if indicated): Fentanyl gtt, Versed PRN VAP protocol (if indicated): Yes  DVT prophylaxis: Holding heparin GI prophylaxis: PPI Glucose control: SSI Mobility: BR Code Status: Full Family Communication: Daughter updated at bedside this AM Disposition: ICU  Labs   CBC: Recent Labs  Lab 03/30/2020 1044 04/03/2020 1058 04/19/20 0313 04/19/20 0508 04/20/20 0007 04/20/20 0458 04/20/20 1555 04/21/20 0320 04/21/20 0516  WBC 18.1*  --  16.4*  --  19.7*  --  21.4* 27.3*  --   NEUTROABS 13.5*  --   --   --  17.2*  --  19.7* 23.0*  --   HGB 11.6*   < > 11.1*   < > 12.2 14.6 12.5 12.8 15.3*  HCT 42.4   < > 39.3   < > 43.6 43.0 45.2 47.8* 45.0  MCV 116.2*  --  110.7*  --  109.8*  --  113.3* 114.9*  --   PLT 74*  --  58*  --  76*  --  80* 77*  --    < > = values in this interval not displayed.    Basic Metabolic Panel: Recent Labs  Lab 04/19/20 0313 04/19/20 0508 04/19/20 2032 04/19/20 2032 04/20/20 0442 04/20/20 0458 04/20/20 1535 04/21/20 0320 04/21/20 0516  NA 137   < > 137   < > 137 137 134* 136 135  K 4.1   < > 4.5   < > 4.5 4.2 4.7 5.0 4.6  CL 100  --  98  --  99  --  100 98  --   CO2 23  --  20*  --  19*  --  18* 14*  --   GLUCOSE 211*  --  90  --  71  --  290* 68*  --   BUN 64*  --  62*  --  51*  --  42* 36*   --   CREATININE 3.86*  --  3.62*  --  3.15*  --  2.97* 2.72*  --   CALCIUM 8.0*  --  8.8*  --  8.5*  --  8.4* 9.2  --   MG 2.1  --   --   --  2.3  --   --  2.5*  --   PHOS 7.1*  --  7.2*  --  6.1*  --  6.0* 6.5*  --    < > = values in this interval not displayed.   GFR: Estimated Creatinine Clearance: 27.2 mL/min (A) (by C-G formula based on SCr of 2.72 mg/dL (H)). Recent Labs  Lab 04/19/20 0313 04/19/20 0815 04/19/20 2032 04/19/20 2032 04/20/20 0007 04/20/20 0442 04/20/20 1109 04/20/20 1535 04/20/20 1555 04/20/20 1950 04/20/20 2236 04/21/20 0320  PROCALCITON  --   --  14.96  --   --  13.42  --   --   --   --   --  9.03  WBC 16.4*  --   --   --  19.7*  --   --   --  21.4*  --   --  27.3*  LATICACIDVEN  --    < > 8.3*   < > 7.5*  --    < > 9.7*  --  >11.0* >11.0* >11.0*   < > = values in this interval not displayed.    Liver Function Tests: Recent Labs  Lab 04/17/2020 1404 04/01/2020 1404 04/19/20 0313 04/19/20 0313 04/19/20 0815 04/19/20 2032 04/20/20 0442 04/20/20 0811 04/20/20 1535 04/21/20 0320  AST 34  --  45*  --   --   --   --  49*  --  126*  ALT 28  --  32  --   --   --   --  38  --  66*  ALKPHOS 184*  --  184*  --   --   --   --  221*  --  216*  BILITOT 7.4*  --  7.4*  --  7.4*  --   --  10.2*  --  12.2*  PROT 5.3*  --  5.6*  --   --   --   --  6.4*  --  6.5  ALBUMIN 1.9*   < > 1.9*   < >  --  2.1* 2.2* 2.2* 2.2* 2.3*  2.2*   < > = values in this interval not displayed.   No results for input(s): LIPASE, AMYLASE in the last 168 hours. Recent Labs  Lab 04/21/20 0320  AMMONIA 43*    ABG    Component Value Date/Time   PHART 7.155 (LL) 04/21/2020 0516   PCO2ART 39.6 04/21/2020 0516   PO2ART 145 (H) 04/21/2020 0516   HCO3 14.0 (L) 04/21/2020 0516   TCO2 15 (L) 04/21/2020 0516   ACIDBASEDEF 14.0 (H) 04/21/2020 0516   O2SAT 99.0 04/21/2020 0516     Coagulation Profile: Recent Labs  Lab 04/23/2020 1346 04/14/2020 1946 04/19/20 0313 04/20/20 0726  04/21/20 0320  INR 3.4* 2.9* 2.9* 2.9* 5.5*    Cardiac Enzymes: No results for input(s): CKTOTAL, CKMB, CKMBINDEX, TROPONINI in the last 168 hours.  HbA1C: Hgb A1c MFr Bld  Date/Time Value Ref Range Status  02/15/2020 01:12 PM 7.5 (H) 4.8 - 5.6 % Final    Comment:    (NOTE) Pre diabetes:          5.7%-6.4% Diabetes:              >6.4% Glycemic control for   <7.0% adults with diabetes   07/04/2012 05:23 AM 6.4 (H) <5.7 % Final    Comment:    (NOTE)                                                                       According to the ADA Clinical Practice Recommendations for 2011, when HbA1c is used as a screening test:  >=6.5%   Diagnostic of Diabetes Mellitus           (if abnormal result is confirmed) 5.7-6.4%   Increased risk of developing Diabetes Mellitus References:Diagnosis and Classification of Diabetes Mellitus,Diabetes IWLN,9892,11(HERDE 1):S62-S69 and Standards of Medical Care in         Diabetes - 2011,Diabetes YCXK,4818,56 (Suppl 1):S11-S61.    CBG: Recent Labs  Lab 04/20/20 1942 04/20/20 2017 04/20/20 2246 04/21/20 0049 04/21/20 0333  GLUCAP 55* 79 81 69* 68*    Review of Systems:   Unable to assess as patient intubated and unresponsive.  Past Medical History  She,  has a past medical history of Acute ischemic stroke ([place]) (07/03/2012), Crack cocaine use, Diabetes ([place]), Diabetes mellitus ([place]), DVT (deep venous thrombosis) ([place]), Hemiparesis, acute ([place]) (07/04/2012), Hypertension, LVH (left ventricular hypertrophy) (07/04/2012), Myocardial infarct, old, Thrombocytopenia ([place]) (07/03/2012), and Tobacco abuse.   Surgical History    Past Surgical History:  Procedure Laterality Date  . ABDOMINAL HYSTERECTOMY    .  HERNIA REPAIR       Social History   reports that she has been smoking cigarettes. She has been smoking about 1.00 pack per day. She has never used smokeless tobacco. She reports that she does not drink alcohol or use drugs.   Family History     Her family history includes Diabetes in her brother and mother; Heart disease in her father and mother; Hypertension in her father and mother; Stroke in her mother.   Allergies No Known Allergies   Home Medications  Prior to Admission medications   Medication Sig Start Date End Date Taking? Authorizing Provider  acetaminophen (TYLENOL) 325 MG tablet Take 2 tablets (650 mg total) by mouth every 6 (six) hours as needed for mild pain (or Fever >/= 101). 02/21/20   Emokpae, Courage, MD  albuterol (PROVENTIL) (2.5 MG/3ML) 0.083% nebulizer solution Take 3 mLs (2.5 mg total) by nebulization every 4 (four) hours as needed for wheezing or shortness of breath. 02/21/20   Denton Brick, Courage, MD  albuterol (VENTOLIN HFA) 108 (90 Base) MCG/ACT inhaler Inhale 2 puffs into the lungs every 4 (four) hours as needed for wheezing or shortness of breath. 02/21/20   Denton Brick, Courage, MD  apixaban (ELIQUIS) 5 MG TABS tablet Take 1 tablet (5 mg total) by mouth 2 (two) times daily. 04/14/20 05/14/20  Herminio Commons, MD  atorvastatin (LIPITOR) 10 MG tablet Take 1 tablet (10 mg total) by mouth every evening. 04/14/20   Herminio Commons, MD  budesonide-formoterol (SYMBICORT) 160-4.5 MCG/ACT inhaler Inhale 2 puffs into the lungs 2 (two) times daily. 02/21/20   Roxan Hockey, MD  cephALEXin (KEFLEX) 250 MG capsule Take 250 mg by mouth 3 (three) times daily. 03/25/20   [provider]  diltiazem (CARDIZEM CD) 120 MG 24 hr capsule Take 1 capsule (120 mg total) by mouth daily. 04/14/20 05/14/20  Herminio Commons, MD  furosemide (LASIX) 40 MG tablet Take 1.5 tablets (60 mg total) by mouth daily. 04/14/20   Herminio Commons, MD  HYDROcodone-acetaminophen ([place]) 7.5-325 MG tablet One tablet as needed every six hours for pain. 04/06/20   Sanjuana Kava, MD  linagliptin (TRADJENTA) 5 MG TABS tablet Take 1 tablet (5 mg total) by mouth daily. 03/20/20   Johnson, Clanford L, MD  mirtazapine (REMERON) 7.5 MG tablet Take  7.5 mg by mouth at bedtime. 03/24/20   [provider]    Dr. Jose Persia Internal Medicine PGY-1  Pager: [phone] 04/21/2020, 6:39 AM

## 2020-04-21 NOTE — Consult Note (Signed)
Consultation Note Date: 04/21/2020   Patient Name: Tracy [name]  DOB: 11/24/1961  MRN: [id number]  Age / Sex: 59 y.o., female  PCP: Rory Percy, MD Referring Physician: Collene Gobble, MD  Reason for Consultation: Establishing goals of care  HPI/Patient Profile: 59 y.o. 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 coli bacteremia. Ongoing treatment for septic and cardiogenic shock and s/p hypothermia protocol. Concern for poor neurological status.   Clinical Assessment and Goals of Care: I met today at Sabina's bedside but no family present. Discussed status with Misha, RN who is caring for her. Concern for further decline over the past 24 hours with multi-organ failure. Overall prognosis is very poor.   I called and spoke with daughter, Tracy [name]. Tracy [name] is a NT and shares with me that her mother has had poor health and has required 24/7 assistance since February. Tracy [name], son Tracy [name], and another helper were taking turns being with Tracy [name]. I discussed with Tracy [name] my concerns with the severity of her mother's illness and her progression that she is getting worse instead of better. Multiple organs failing that may be too overwhelming for her body to gain any recovery. She is struggling to absorb tube feeding and keeping up blood sugar and these are indications of just how much stress her body is under and even shutting down. I was honest with Tracy [name] that I fear that her mother's heart could stop at any time and she could die from this illness. Tracy [name] verbalizes understanding and recognizes her mother is critically ill. I explained the importance of continuing to  support her mother and hoping for the best but that we should also have a discussion and plan for the worst. Tracy [name] understands and I explain that I think serious thought and conversation should be considered if we perform CPR again. If her mother has another cardiac arrest I do not feel there is any coming back from that decline. Tracy [name] says they had not considered or thought about this and that everything has seemed to be so sudden and chaotic they hadn't had time to think about this. I encouraged her to think and speak with her family so we are prepared.   I also offered to have my colleague meet with her tomorrow morning (after her night shift 7p-7a) to touch base on any chances or decisions that are made. I explained that we want to ensure she is getting all the information she needs to be able to support and make good decisions for her mother. She agrees and will be at bedside ~0730 in the morning.   Primary Decision Maker NEXT OF KIN adult children Tracy [name] & Tracy [name]    SUMMARY OF RECOMMENDATIONS   - Ongoing goals of care conversations - No changes in care plan today  Code Status/Advance Care Planning:  Full code   Symptom Management:   Per PCCM  Palliative Prophylaxis:   Aspiration, Bowel Regimen, Eye Care, Frequent Pain Assessment, Oral Care and Turn Reposition  Additional Recommendations (Limitations, Scope, Preferences):  Full Scope Treatment  Psycho-social/Spiritual:   Desire for further Chaplaincy support:yes  Additional Recommendations: Caregiving  Support/Resources and Grief/Bereavement Support  Prognosis:   Overall prognosis poor. High risk for acute decompensation and hospital death.   Discharge Planning: To Be Determined      Primary Diagnoses: Present on Admission: . Cardiac arrest ([place])   I have reviewed the medical record, interviewed the patient and family, and examined the patient. The following aspects are pertinent.  Past Medical History:    Diagnosis Date  . Acute ischemic stroke ([place]) 07/03/2012  . Crack cocaine use   . Diabetes ([place])   . Diabetes mellitus ([place])   . DVT (deep venous thrombosis) ([place])   . Hemiparesis, acute ([place]) 07/04/2012  . Hypertension   . LVH (left ventricular hypertrophy) 07/04/2012   Ejection fraction 60%.  . Myocardial infarct, old   . Thrombocytopenia ([place]) 07/03/2012  . Tobacco abuse    Social History   Socioeconomic History  . Marital status: Widowed    Spouse name: Not on file  . Number of children: Not on file  . Years of education: Not on file  . Highest education level: Not on file  Occupational History  . Not on file  Tobacco Use  . Smoking status: Current Every Day Smoker    Packs/day: 1.00    Types: Cigarettes  . Smokeless tobacco: Never Used  Substance and Sexual Activity  . Alcohol use: No  . Drug use: No    Comment: last used 2 years ago  . Sexual activity: Never    Birth control/protection: Surgical  Other Topics Concern  . Not on file  Social History Narrative  . Not on file   Social Determinants of Health   Financial Resource Strain:   . Difficulty of Paying Living Expenses:   Food Insecurity:   . Worried About Charity fundraiser in the Last Year:   . Arboriculturist in the Last Year:   Transportation Needs:   . Film/video editor (Medical):   [name] Lack of Transportation (Non-Medical):   Physical Activity:   . Days of Exercise per Week:   . Minutes of Exercise per Session:   Stress:   . Feeling of Stress :   Social Connections:   . Frequency of Communication with Friends and Family:   . Frequency of Social Gatherings with Friends and Family:   . Attends Religious Services:   . Active Member of Clubs or Organizations:   . Attends Archivist Meetings:   [name] Marital Status:    Family History  Problem Relation Age of Onset  . Heart disease Mother   . Diabetes Mother   . Stroke Mother   . Hypertension Mother   . Hypertension Father   . Heart  disease Father   . Diabetes Brother    Scheduled Meds: . budesonide (PULMICORT) nebulizer solution  0.5 mg Nebulization BID  . chlorhexidine gluconate (MEDLINE KIT)  15 mL Mouth Rinse BID  . Chlorhexidine Gluconate Cloth  6 each Topical Daily  . dextrose      . EPINEPHrine      . ipratropium-albuterol  3 mL Nebulization Q6H  . mouth rinse  15 mL Mouth Rinse 10 times per day  . pantoprazole (PROTONIX) IV  40 mg Intravenous QHS   Continuous Infusions: .  prismasol BGK 4/2.5 500 mL/hr at 04/20/20 1545  .  prismasol BGK 4/2.5 200 mL/hr at 04/19/20 1912  . sodium chloride    . amiodarone 30 mg/hr (04/21/20 0700)  . ceFEPime (MAXIPIME) IV Stopped (04/20/20 2202)  . dextrose 50 mL/hr at 04/21/20 0806  . feeding supplement (VITAL HIGH PROTEIN) Stopped (04/21/20 0759)  . fentaNYL infusion INTRAVENOUS 50 mcg/hr (04/21/20 0821)  . levETIRAcetam Stopped (04/21/20 0641)  . norepinephrine (LEVOPHED) Adult infusion 5 mcg/min (04/21/20 0923)  . phytonadione (VITAMIN K) IV 10 mg (04/21/20 0939)  . prismasol BGK 4/2.5 1,000 mL/hr at 04/21/20 0719  . sodium bicarbonate 150 mEq in dextrose 5% 1000 mL 150 mEq (04/21/20 0700)   PRN Meds:.artificial tears, docusate sodium, fentaNYL, heparin, heparin, midazolam, polyethylene glycol No Known Allergies Review of Systems  Unable to perform ROS: Acuity of condition    Physical Exam Vitals and nursing note reviewed.  Constitutional:      Appearance: She is ill-appearing and toxic-appearing.     Interventions: She is intubated.  Cardiovascular:     Rate and Rhythm: Tachycardia present. Rhythm irregularly irregular.  Pulmonary:     Effort: She is intubated.     Comments: Breathing appears agonal even on vent; on fentanyl for vent dyssynchrony Abdominal:     Palpations: Abdomen is soft.  Neurological:     Mental Status: She is unresponsive.     Vital Signs: BP 90/62   Pulse (!) 101   Temp 98.8 F (37.1 C) (Core)   Resp (!) 31   Ht '5\' 5"'   (1.651 m)   Wt 107.7 kg   SpO2 93%   BMI 39.51 kg/m  Pain Scale: CPOT   Pain Score: 0-No pain   SpO2: SpO2: 93 % O2 Device:SpO2: 93 % O2 Flow Rate: .   IO: Intake/output summary:   Intake/Output Summary (Last 24 hours) at 04/21/2020 0954 Last data filed at 04/21/2020 0800 Gross per 24 hour  Intake 3493.23 ml  Output 6297 ml  Net -2803.77 ml    LBM: Last BM Date: (UTA) Baseline Weight: Weight: 110 kg Most recent weight: Weight: 107.7 kg     Palliative Assessment/Data:     Time In/Out: 1030-1100, 9833-8250 Time Total: 70 min Greater than 50%  of this time was spent counseling and coordinating care related to the above assessment and plan.  Signed by: Vinie Sill, NP Palliative Medicine Team Pager # [phone] (M-F 8a-5p) Team Phone # [phone] (Nights/Weekends)

## 2020-04-21 NOTE — Progress Notes (Signed)
[facility] Progress Note   Subjective:    Patient evaluated alone, daughter [name] not at bedside. Patient disconnected from CRRT in preparation CT A/P. 3L removed via CRRT yesterday. Patient still up roughly 200 mL since admission without improvement in volume status clinically with persistent hypotension recently requiring increased Levo and addition of vasopressin, anasarca, and poor RV function. Patient remains anuric, 10 mL UOP yesterday.   Objective: Vitals:   04/21/20 1030 04/21/20 1045 04/21/20 1100 04/21/20 1115  BP: (!) 65/50 (!) 86/70 93/75 98/77  Pulse:      Resp: (!) 28 (!) 30 (!) 24 (!) 27  Temp: (!) 97.2 F (36.2 C) (!) 97 F (36.1 C) (!) 96.6 F (35.9 C) (!) 96.3 F (35.7 C)  TempSrc:      SpO2:      Weight:      Height:       Physical Exam: General: Obese female, unresponsive and intubated. Heart: Distant heart sounds. Irregular rhythm.Tachycardic.No rubs. Lungs: Coarse breath sounds, intubated on mechanical ventilation. FiO2 40%. Abdomen: Distended with swelling into pubic area, tense. BS diminished.  Extremities: Upper extremities and feet cool, somewhat jaundiced. Persistent1+ edema to BL UE, 2+ edema to BL LE, lower legs wrapped due to weeping. Dialysis Access: R IJ double lumen catheter - in use  Filed Weights   04/20/20 0224 04/21/20 0344 04/21/20 1000  Weight: 111 kg 107.7 kg 107.7 kg    Intake/Output Summary (Last 24 hours) at 04/21/2020 1131 Last data filed at 04/21/2020 1100 Gross per 24 hour  Intake 4215.43 ml  Output 6521 ml  Net -2305.57 ml    Additional Objective Labs: Basic Metabolic Panel: Recent Labs  Lab 04/20/20 0442 04/20/20 0458 04/20/20 1535 04/21/20 0320 04/21/20 0516  NA 137   < > 134* 136 135  K 4.5   < > 4.7 5.0 4.6  CL 99  --  100 98  --   CO2 19*  --  18* 14*  --   GLUCOSE 71  --  290* 68*  --   BUN 51*  --  42* 36*  --   CREATININE 3.15*  --  2.97* 2.72*  --   CALCIUM 8.5*  --  8.4* 9.2  --    PHOS 6.1*  --  6.0* 6.5*  --    < > = values in this interval not displayed.   Liver Function Tests: Recent Labs  Lab 04/19/20 0313 04/19/20 0313 04/19/20 0815 04/19/20 2032 04/20/20 0811 04/20/20 1535 04/21/20 0320  AST 45*  --   --   --  49*  --  126*  ALT 32  --   --   --  38  --  66*  ALKPHOS 184*  --   --   --  221*  --  216*  BILITOT 7.4*   < > 7.4*  --  10.2*  --  12.2*  PROT 5.6*  --   --   --  6.4*  --  6.5  ALBUMIN 1.9*  --   --    < > 2.2* 2.2* 2.3*  2.2*   < > = values in this interval not displayed.   No results for input(s): LIPASE, AMYLASE in the last 168 hours. CBC: Recent Labs  Lab 04/20/2020 1044 04/21/2020 1058 04/19/20 0313 04/19/20 0508 04/20/20 0007 04/20/20 0458 04/20/20 1555 04/21/20 0320 04/21/20 0516  WBC 18.1*   < > 16.4*   < > 19.7*  --  21.4* 27.3*  --  NEUTROABS 13.5*   < >  --   --  17.2*  --  19.7* 23.0*  --   HGB 11.6*   < > 11.1*   < > 12.2   < > 12.5 12.8 15.3*  HCT 42.4   < > 39.3   < > 43.6   < > 45.2 47.8* 45.0  MCV 116.2*  --  110.7*  --  109.8*  --  113.3* 114.9*  --   PLT 74*   < > 58*   < > 76*  --  80* 77*  --    < > = values in this interval not displayed.   Blood Culture    Component Value Date/Time   SDES BLOOD RIGHT ANTECUBITAL 04/21/2020 1113   SPECREQUEST  04/10/2020 1113    BOTTLES DRAWN AEROBIC AND ANAEROBIC Blood Culture results may not be optimal due to an excessive volume of blood received in culture bottles   CULT  04/07/2020 1113    CULTURE REINCUBATED FOR BETTER GROWTH Performed at [facility], [address] [place], [place] [address]    REPTSTATUS PENDING 04/06/2020 1113    Cardiac Enzymes: No results for input(s): CKTOTAL, CKMB, CKMBINDEX, TROPONINI in the last 168 hours. CBG: Recent Labs  Lab 04/21/20 0049 04/21/20 0333 04/21/20 0640 04/21/20 0744 04/21/20 0943  GLUCAP 69* 68* 110* 137* 164*   Iron Studies: No results for input(s): IRON, TIBC, TRANSFERRIN, FERRITIN in the last 72  hours. Lab Results  Component Value Date   INR 5.5 (HH) 04/21/2020   INR 2.9 (H) 04/20/2020   INR 2.9 (H) 04/19/2020   Studies/Results: CT ABDOMEN PELVIS WO CONTRAST  Result Date: 04/21/2020 CLINICAL DATA:  Septic shock. Worsening lactic acidosis. Concern for bowel ischemia. EXAM: CT ABDOMEN AND PELVIS WITHOUT CONTRAST TECHNIQUE: Multidetector CT imaging of the abdomen and pelvis was performed following the standard protocol without IV contrast. COMPARISON:  None. FINDINGS: Lower chest: Mild basilar atelectasis. Hepatobiliary: No focal hepatic lesion on noncontrast exam Pancreas: No pancreatic inflammation identified. Pancreatic atrophy. No duct dilatation or inflammation. Spleen: Normal spleen Adrenals/urinary tract: Adrenal glands are poorly evaluated. Kidneys grossly normal noncontrast exam. Ureters bladder grossly normal. Foley catheter within the bladder. Stomach/Bowel: NG tube in stomach. Duodenum small-bowel appear normal. The small bowel is chronic collapse. No evidence of pneumatosis. The ascending colon is collapsed. Transverse colon descending colon normal. Rectum normal. No pneumatosis. No portal venous gas. Vascular/Lymphatic: Scattered intimal calcifications of the aorta. No aneurysm. LEFT internal iliac artery is dilated 1.6 cm Reproductive: Post hysterectomy anatomy ovaries not appreciated Other: Small amount free fluid in the anterior pelvis and lower abdominal mesentery (image 61/3). There is anasarca of the soft tissues. Musculoskeletal: No aggressive osseous lesion. Increased density of the vertebral bodies suggest medical renal disease. IMPRESSION: 1. No evidence of bowel ischemia on noncontrast CT. No pneumatosis, portal venous gas or bowel distension. The bowel is predominantly collapsed. NG tube in stomach 2. No evidence of infection in the abdomen pelvis on noncontrast exam. 3. Small amount of intraperitoneal free fluid is present in the lower pelvis and mesentery. 4. Anasarca soft  tissues. 5. Vertebral body sclerosis suggests medical renal disease. Electronically Signed   By: Stewart  Edmunds M.D.   On: 04/21/2020 09:49   DG Chest Port 1 View  Result Date: 04/21/2020 CLINICAL DATA:  Acute respiratory failure. EXAM: PORTABLE CHEST 1 VIEW COMPARISON:  Radiograph 04/19/2020 FINDINGS: Borderline low positioning of the endotracheal tube 14 mm from the carina. Enteric tube   in place tip below the diaphragm not included in the field of view. Right internal jugular central venous catheter tip projects over the upper SVC. Stable cardiomegaly. Aortic atherosclerosis. Improving left upper lobe aeration. There are fine reticular opacities throughout both lungs. Small pleural effusions. No pneumothorax. IMPRESSION: 1. Borderline low positioning of the endotracheal tube 14 mm from the carina. 2. Improving left upper lobe aeration. 3. Stable cardiomegaly. Small pleural effusions. Fine reticular opacities throughout both lungs may represent edema. Electronically Signed   By: Melanie  Sanford M.D.   On: 04/21/2020 03:02   DG CHEST PORT 1 VIEW  Result Date: 04/19/2020 CLINICAL DATA:  58-year-old female with central line placement. EXAM: PORTABLE CHEST 1 VIEW COMPARISON:  Earlier radiograph dated 04/19/2020. FINDINGS: Right IJ central venous line with tip over central SVC. No pneumothorax. Endotracheal tube remains above the carina. No significant interval change in the appearance of the lungs or cardiomediastinal silhouette since the earlier radiograph. IMPRESSION: Right IJ central venous line with tip over central SVC. No pneumothorax. Electronically Signed   By: Arash  Radparvar M.D.   On: 04/19/2020 17:13   ECHOCARDIOGRAM COMPLETE  Result Date: 04/19/2020    ECHOCARDIOGRAM REPORT   Patient Name:   Kennah C Friar Date of Exam: 04/19/2020 Medical Rec #:  [id number]             Height:       65.0 in Accession #:    [id number]            Weight:       249.6 lb Date of Birth:  02/15/1961             BSA:          2.173 m Patient Age:    58 years              BP:           119/74 mmHg Patient Gender: F                     HR:           102 bpm. Exam Location:  Inpatient Procedure: 2D Echo, Cardiac Doppler and Color Doppler Indications:    Cardiac arrest  History:        Patient has prior history of Echocardiogram examinations, most                 recent 02/16/2020. Previous Myocardial Infarction, Stroke; Risk                 Factors:Dyslipidemia and Current Smoker. Substanace abuse.  Sonographer:    Casey Kirkpatrick RDCS (AE) Referring Phys: [id number] KEVIN STEINL IMPRESSIONS  1. Left ventricular ejection fraction, by estimation, is 55 to 60%. The left ventricle has normal function. The left ventricle has no regional wall motion abnormalities. There is mild concentric left ventricular hypertrophy. Left ventricular diastolic function could not be evaluated. There is the interventricular septum is flattened in systole and diastole, consistent with right ventricular pressure and volume overload.  2. RVSP underestimated in setting of severe TR. Right ventricular systolic function is severely reduced. The right ventricular size is severely enlarged. There is mildly elevated pulmonary artery systolic pressure. The estimated right ventricular systolic pressure is 44.4 mmHg.  3. Right atrial size was severely dilated.  4. Moderate pericardial effusion. The pericardial effusion is posterior to the left ventricle.  5. The mitral valve is grossly normal. Trivial mitral valve regurgitation. No evidence of mitral   stenosis.  6. Tricuspid valve regurgitation is severe.  7. The aortic valve is tricuspid. Aortic valve regurgitation is not visualized. Mild aortic valve sclerosis is present, with no evidence of aortic valve stenosis.  8. The inferior vena cava is dilated in size with <50% respiratory variability, suggesting right atrial pressure of 15 mmHg. Comparison(s): Changes from prior study are noted. Moderate pericardial  effusion now present. Severe RV dilation and severely reduced RV function remains. Conclusion(s)/Recommendation(s): Findings consistent with Cor Pulmonale. FINDINGS  Left Ventricle: Left ventricular ejection fraction, by estimation, is 55 to 60%. The left ventricle has normal function. The left ventricle has no regional wall motion abnormalities. The left ventricular internal cavity size was small. There is mild concentric left ventricular hypertrophy. The interventricular septum is flattened in systole and diastole, consistent with right ventricular pressure and volume overload. Left ventricular diastolic function could not be evaluated due to atrial fibrillation. Left ventricular diastolic function could not be evaluated. Right Ventricle: RVSP underestimated in setting of severe TR. The right ventricular size is severely enlarged. No increase in right ventricular wall thickness. Right ventricular systolic function is severely reduced. There is mildly elevated pulmonary artery systolic pressure. The tricuspid regurgitant velocity is 2.71 m/s, and with an assumed right atrial pressure of 15 mmHg, the estimated right ventricular systolic pressure is 44.4 mmHg. Left Atrium: Left atrial size was normal in size. Right Atrium: Right atrial size was severely dilated. Pericardium: A moderately sized pericardial effusion is present. The pericardial effusion is posterior to the left ventricle. Presence of pericardial fat pad. Mitral Valve: The mitral valve is grossly normal. Trivial mitral valve regurgitation. No evidence of mitral valve stenosis. Tricuspid Valve: The tricuspid valve is grossly normal. Tricuspid valve regurgitation is severe. No evidence of tricuspid stenosis. The flow in the hepatic veins is reversed during ventricular systole. Aortic Valve: The aortic valve is tricuspid. . There is moderate thickening and moderate calcification of the aortic valve. Aortic valve regurgitation is not visualized. Mild aortic  valve sclerosis is present, with no evidence of aortic valve stenosis. There is moderate thickening of the aortic valve. There is moderate calcification of the aortic valve. Pulmonic Valve: The pulmonic valve was grossly normal. Pulmonic valve regurgitation is trivial. No evidence of pulmonic stenosis. Aorta: The aortic root and ascending aorta are structurally normal, with no evidence of dilitation. Venous: The inferior vena cava is dilated in size with less than 50% respiratory variability, suggesting right atrial pressure of 15 mmHg. IAS/Shunts: There is left bowing of the interatrial septum, suggestive of elevated right atrial pressure. The atrial septum is grossly normal. Additional Comments: There is a small pleural effusion in the left lateral region.  LEFT VENTRICLE PLAX 2D LVIDd:         4.10 cm LVIDs:         2.95 cm LV PW:         1.20 cm LV IVS:        1.20 cm LVOT diam:     2.30 cm LV SV:         38 LV SV Index:   17 LVOT Area:     4.15 cm  LEFT ATRIUM             Index       RIGHT ATRIUM           Index LA diam:        3.40 cm 1.56 cm/m  RA Area:     42.60 cm LA Vol (  A2C):   59.4 ml 27.33 ml/m RA Volume:   178.00 ml 81.90 ml/m LA Vol (A4C):   59.8 ml 27.52 ml/m LA Biplane Vol: 65.6 ml 30.18 ml/m  AORTIC VALVE LVOT Vmax:   52.00 cm/s LVOT Vmean:  33.600 cm/s LVOT VTI:    0.091 m  AORTA Ao Root diam: 3.60 cm TRICUSPID VALVE TR Peak grad:   29.4 mmHg TR Vmax:        271.00 cm/s  SHUNTS Systemic VTI:  0.09 m Systemic Diam: 2.30 cm Ellinwood O'Neal MD Electronically signed by Convoy O'Neal MD Signature Date/Time: 04/19/2020/12:02:30 PM    Final    US LIVER DOPPLER  Result Date: 04/19/2020 CLINICAL DATA:  Abnormal liver function tests. EXAM: DUPLEX ULTRASOUND OF LIVER TECHNIQUE: Color and duplex Doppler ultrasound was performed to evaluate the hepatic in-flow and out-flow vessels. COMPARISON:  Right upper quadrant abdominal ultrasound on 03/07/2020 FINDINGS: Portal Vein Velocities Due to inability to  suspend breathing on a ventilator, the portal vein could not be accurately sampled by duplex ultrasound. There clearly is color Doppler flow in the portal vein and flow is towards the liver. Hepatic Vein Velocities Right:  59 cm/sec Middle:  51 cm/sec Left:  49 cm/sec Hepatic vein waveforms appear normal. The intrahepatic IVC is patent. Hepatic Artery Velocity:  123 cm/sec Splenic Vein Velocity:  The splenic vein was not able to be sampled. Varices: None visualized. Ascites: A small amount of ascites was visualized in the peritoneal cavity. IMPRESSION: Limited study due to inability to accurately sample portal veins or splenic vein by duplex ultrasound due to respiratory motion. The portal vein is not occluded by color Doppler. Hepatic veins are patent and demonstrate normal waveforms and velocities. A small amount of ascites was identified. Electronically Signed   By: Glenn  Yamagata M.D.   On: 04/19/2020 16:25   US Abdomen Limited RUQ  Result Date: 04/19/2020 CLINICAL DATA:  Hyperbilirubinemia EXAM: ULTRASOUND ABDOMEN LIMITED RIGHT UPPER QUADRANT COMPARISON:  03/07/2020 FINDINGS: Gallbladder: Layering biliary sludge present within the gallbladder lumen. No shadowing gallstones or wall thickening visualized. No sonographic Murphy sign noted by sonographer. Common bile duct: Diameter: 4 mm Liver: No focal lesion identified. Within normal limits in parenchymal echogenicity. Portal vein is patent on color Doppler imaging with normal direction of blood flow towards the liver. Other: Small volume ascites. Partially visualized right pleural effusion. IMPRESSION: 1. Biliary sludge without evidence of cholecystitis. 2. Small volume ascites. 3. Partially visualized right-sided pleural effusion. Electronically Signed   By: Nicholas  Plundo D.O.   On: 04/19/2020 16:23    Medications: .  prismasol BGK 4/2.5 500 mL/hr at 04/21/20 1005  .  prismasol BGK 4/2.5 200 mL/hr at 04/21/20 1005  . sodium chloride    .  amiodarone 30 mg/hr (04/21/20 1100)  . ceFEPime (MAXIPIME) IV Stopped (04/21/20 1044)  . dextrose 30 mL/hr at 04/21/20 1100  . feeding supplement (VITAL HIGH PROTEIN) Stopped (04/21/20 0759)  . fentaNYL infusion INTRAVENOUS 100 mcg/hr (04/21/20 1100)  . heparin 1,400 Units/hr (04/21/20 1115)  . levETIRAcetam Stopped (04/21/20 0641)  . norepinephrine (LEVOPHED) Adult infusion 30 mcg/min (04/21/20 1100)  . prismasol BGK 4/2.5 1,000 mL/hr at 04/21/20 1004  . sodium bicarbonate 150 mEq in dextrose 5% 1000 mL 150 mEq (04/21/20 1121)  . vasopressin (PITRESSIN) infusion - *FOR SHOCK* 0.03 Units/min (04/21/20 1100)   . budesonide (PULMICORT) nebulizer solution  0.5 mg Nebulization BID  . chlorhexidine gluconate (MEDLINE KIT)  15 mL Mouth Rinse BID  . Chlorhexidine Gluconate   Cloth  6 each Topical Daily  . dextrose      . EPINEPHrine      . hydrocortisone sod succinate (SOLU-CORTEF) inj  50 mg Intravenous Q6H  . ipratropium-albuterol  3 mL Nebulization Q6H  . mouth rinse  15 mL Mouth Rinse 10 times per day  . pantoprazole (PROTONIX) IV  40 mg Intravenous QHS    Assessment/Plan: Patient is a 58 y.o. female with PMH of right-sided HF, HTN, DM, progressive CKD, COPD, and Afib with multiple recent admissions for volume overload/AKI who presented to [facility] 04/10/2020 following cardiopulmonary arrest. She was encephalopathic with hypoxic hypercapnic respiratory failure requiring intubation with mechanical ventilation. Cr 3.5 on admission. Estimated baseline Cr 1.3-1.9. Receiving care in the cardiac ICU. Has completed therapeutic cooling and warming. Receiving continuous EEG. Nephrology consulted for AKI and anuria. CRRT began 04/19/20 via R IJ catheter with current net UF of 100 mL/hr. Concerning progressive decline over the past 24 hours with increasing pressor needs and progressive lactic acidosis.   1. 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 - 3L removed yesterday though persistent metabolic acidosis and rising LA. Bicarb drip started by ICU team overnight. Increased RR to help patient better compensate.  - 04/21/20: BUN 36, Cr 2.72, K 5, CO2 14, Phos 6.5. Will monitor.  - Primary team concerned for both cardiogenic and septic shock. Pressors titrated up, vasopressin added.  - Non-contrasted CT A/P 04/21/20 revealed vertebral body sclerosis suggests medical renal disease. PLAN: - Continue CRRT with goal net UF 100 mL/hr to help with her volume status. May adjust depending on hemodynamic stability.  - Will continue monitoring renal function and reassess volume status.  - Palliative consulted to speak with family. Currently FULL code but daughter Christie will speak with her brother and revisit this with the PM and critical care teams.   2. Acute on chronic R-sided HF with cardiogenic shock - Lasix discontinued with initiation of CRRT.  - Will monitor volume status closely along with ICU team.   3.Paroxsymal A-Fib - On diltiazem and eliquis at home. Being held currently. Remaining in afib thus far this admission.  - Cardiology consulted and started amiodarone drip.   4. Acute encephalopathy - Neurological prognosis remains unclear. 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  - Will continue to support patient from a renal standpoint helping with her volume status as her neurological status is determined.   5. E. coli UTI, GNR bacteremia with lactic acidosis - On cefepime. Continue per primary team. Will follow renal function.  - ICU team concerned for unidentified ischemic or infectious source. Non-contrasted CT A/P this morning reveal no evidence of bowel ischemia or infection.     Nephrology will continue to follow and manage CRRT. Please contact nephrology attending, Dr. Ryan Sanford, with questions.   Jessica McDaniel, PA-S2 [employer] 

## 2020-04-21 NOTE — Progress Notes (Signed)
PCCM interval note  Patient progressively worse through the day.  Increasing pressor needs.  CT scan of the abdomen did not show any evidence of pneumatosis or bowel ischemia.  No hydronephrosis or evidence to support complicated UTI.  No evidence to support cholestasis.  We have continued full support, norepinephrine uptitrated, vasopressin added, bicarbonate infusion increased.  She remains on CVVHD.  Appreciate the input of Dr. Rory Percy and neurology.  Unfortunately his evaluation is consistent with very poor prognosis for any meaningful neurological recovery or improvement.  I called patient's daughter [name] to update her.  Informed her of Ms. Speyer's instability, progressive shock, persistent acidosis.  We agreed to continue current level of care, continue and try interventions that would allow [place] to achieve hemodynamic stability until she is able to get to the hospital to visit this afternoon/evening.  At that time we would need to discuss her neurologic prognosis, possible transition to comfort.  In the meantime [facility] and I did agree that we would defer CPR/ACLS if the patient arrests.  We will place no CPR orders on the chart.  Independent CC time 40 minutes  Baltazar Apo, MD, PhD 04/21/2020, 5:18 PM [facility] [phone] or if no answer [phone]

## 2020-04-21 NOTE — Progress Notes (Signed)
Wasted 123ml of fentanyl with Q RN

## 2020-04-21 NOTE — Progress Notes (Signed)
eLink Physician-Brief Progress Note Patient Name: Tracy [name] DOB: April 05, 1961 MRN: [id number]   Date of Service  04/21/2020  HPI/Events of Note  Metabolic acidosis with PH 7.15 and cardiac dysfunction reflected in on going hypotension, patient has also had a profound lactic acidosis for several days, anoxic brain injury.  eICU Interventions  Will start a bicarbonate infusion to address the contribution of metabolic acidosis to cardiac dysfunction.        Kerry Kass Rochelle Larue 04/21/2020, 6:20 AM

## 2020-04-21 NOTE — Progress Notes (Signed)
Patient was dysynchronous with the ventilator. Versed and Fentanyl pushes were given with no improvement. Peak pressures were elevated in the 40's. Attempted to suction airway with nothing coming out. Elink was paged. Orders were to start Propofol infusion. MD was also made aware about systolic BP in the 25'L. Currently maxed on Levo at 105mcg. MD confirmed that he will increase the range for BP support. Will continue to monitor.

## 2020-04-22 DIAGNOSIS — G92 Toxic encephalopathy: Secondary | ICD-10-CM

## 2020-04-22 DIAGNOSIS — N390 Urinary tract infection, site not specified: Secondary | ICD-10-CM

## 2020-04-22 LAB — POCT I-STAT 7, (LYTES, BLD GAS, ICA,H+H)
Acid-base deficit: 12 mmol/L — ABNORMAL HIGH (ref 0.0–2.0)
Acid-base deficit: 13 mmol/L — ABNORMAL HIGH (ref 0.0–2.0)
Bicarbonate: 16 mmol/L — ABNORMAL LOW (ref 20.0–28.0)
Bicarbonate: 14.6 mmol/L — ABNORMAL LOW (ref 20.0–28.0)
Calcium, Ion: 1.07 mmol/L — ABNORMAL LOW (ref 1.15–1.40)
Calcium, Ion: 1.03 mmol/L — ABNORMAL LOW (ref 1.15–1.40)
HCT: 44 % (ref 36.0–46.0)
HCT: 44 % (ref 36.0–46.0)
Hemoglobin: 15 g/dL (ref 12.0–15.0)
Hemoglobin: 15 g/dL (ref 12.0–15.0)
O2 Saturation: 95 %
O2 Saturation: 91 %
Patient temperature: 36.4
Patient temperature: 35.9
Potassium: 4.2 mmol/L (ref 3.5–5.1)
Potassium: 4.6 mmol/L (ref 3.5–5.1)
Sodium: 138 mmol/L (ref 135–145)
Sodium: 135 mmol/L (ref 135–145)
TCO2: 17 mmol/L — ABNORMAL LOW (ref 22–32)
TCO2: 16 mmol/L — ABNORMAL LOW (ref 22–32)
pCO2 arterial: 39.9 mmHg (ref 32.0–48.0)
pCO2 arterial: 36.3 mmHg (ref 32.0–48.0)
pH, Arterial: 7.208 — ABNORMAL LOW (ref 7.350–7.450)
pH, Arterial: 7.206 — ABNORMAL LOW (ref 7.350–7.450)
pO2, Arterial: 88 mmHg (ref 83.0–108.0)
pO2, Arterial: 69 mmHg — ABNORMAL LOW (ref 83.0–108.0)

## 2020-04-22 LAB — CBC WITH DIFFERENTIAL/PLATELET
Abs Immature Granulocytes: 1.41 10*3/uL — ABNORMAL HIGH (ref 0.00–0.07)
Basophils Absolute: 0.2 10*3/uL — ABNORMAL HIGH (ref 0.0–0.1)
Basophils Relative: 1 %
Eosinophils Absolute: 0 10*3/uL (ref 0.0–0.5)
Eosinophils Relative: 0 %
HCT: 44.1 % (ref 36.0–46.0)
Hemoglobin: 11.9 g/dL — ABNORMAL LOW (ref 12.0–15.0)
Immature Granulocytes: 4 %
Lymphocytes Relative: 7 %
Lymphs Abs: 2.4 10*3/uL (ref 0.7–4.0)
MCH: 31.4 pg (ref 26.0–34.0)
MCHC: 27 g/dL — ABNORMAL LOW (ref 30.0–36.0)
MCV: 116.4 fL — ABNORMAL HIGH (ref 80.0–100.0)
Monocytes Absolute: 0.5 10*3/uL (ref 0.1–1.0)
Monocytes Relative: 1 %
Neutro Abs: 30.8 10*3/uL — ABNORMAL HIGH (ref 1.7–7.7)
Neutrophils Relative %: 87 %
Platelets: 47 10*3/uL — ABNORMAL LOW (ref 150–400)
RBC: 3.79 MIL/uL — ABNORMAL LOW (ref 3.87–5.11)
RDW: 31.3 % — ABNORMAL HIGH (ref 11.5–15.5)
WBC: 35.3 10*3/uL — ABNORMAL HIGH (ref 4.0–10.5)
nRBC: 16.3 % — ABNORMAL HIGH (ref 0.0–0.2)

## 2020-04-22 LAB — RENAL FUNCTION PANEL
Albumin: 1.7 g/dL — ABNORMAL LOW (ref 3.5–5.0)
Albumin: 1.6 g/dL — ABNORMAL LOW (ref 3.5–5.0)
Anion gap: 24 — ABNORMAL HIGH (ref 5–15)
Anion gap: 21 — ABNORMAL HIGH (ref 5–15)
BUN: 25 mg/dL — ABNORMAL HIGH (ref 6–20)
BUN: 20 mg/dL (ref 6–20)
CO2: 15 mmol/L — ABNORMAL LOW (ref 22–32)
CO2: 17 mmol/L — ABNORMAL LOW (ref 22–32)
Calcium: 8.2 mg/dL — ABNORMAL LOW (ref 8.9–10.3)
Calcium: 8.1 mg/dL — ABNORMAL LOW (ref 8.9–10.3)
Chloride: 99 mmol/L (ref 98–111)
Chloride: 101 mmol/L (ref 98–111)
Creatinine, Ser: 2.33 mg/dL — ABNORMAL HIGH (ref 0.44–1.00)
Creatinine, Ser: 1.89 mg/dL — ABNORMAL HIGH (ref 0.44–1.00)
GFR calc Af Amer: 26 mL/min — ABNORMAL LOW (ref >60)
GFR calc Af Amer: 33 mL/min — ABNORMAL LOW (ref >60)
GFR calc non Af Amer: 22 mL/min — ABNORMAL LOW (ref >60)
GFR calc non Af Amer: 29 mL/min — ABNORMAL LOW (ref >60)
Glucose, Bld: 101 mg/dL — ABNORMAL HIGH (ref 70–99)
Glucose, Bld: 118 mg/dL — ABNORMAL HIGH (ref 70–99)
Phosphorus: 5.4 mg/dL — ABNORMAL HIGH (ref 2.5–4.6)
Phosphorus: 4.8 mg/dL — ABNORMAL HIGH (ref 2.5–4.6)
Potassium: 4.8 mmol/L (ref 3.5–5.1)
Potassium: 4.5 mmol/L (ref 3.5–5.1)
Sodium: 138 mmol/L (ref 135–145)
Sodium: 139 mmol/L (ref 135–145)

## 2020-04-22 LAB — COMPREHENSIVE METABOLIC PANEL
ALT: 961 U/L — ABNORMAL HIGH (ref 0–44)
AST: 2652 U/L — ABNORMAL HIGH (ref 15–41)
Albumin: 1.6 g/dL — ABNORMAL LOW (ref 3.5–5.0)
Alkaline Phosphatase: 265 U/L — ABNORMAL HIGH (ref 38–126)
Anion gap: 25 — ABNORMAL HIGH (ref 5–15)
BUN: 19 mg/dL (ref 6–20)
CO2: 15 mmol/L — ABNORMAL LOW (ref 22–32)
Calcium: 8.1 mg/dL — ABNORMAL LOW (ref 8.9–10.3)
Chloride: 99 mmol/L (ref 98–111)
Creatinine, Ser: 2.03 mg/dL — ABNORMAL HIGH (ref 0.44–1.00)
GFR calc Af Amer: 30 mL/min — ABNORMAL LOW (ref >60)
GFR calc non Af Amer: 26 mL/min — ABNORMAL LOW (ref >60)
Glucose, Bld: 103 mg/dL — ABNORMAL HIGH (ref 70–99)
Potassium: 4.9 mmol/L (ref 3.5–5.1)
Sodium: 139 mmol/L (ref 135–145)
Total Bilirubin: 15.7 mg/dL — ABNORMAL HIGH (ref 0.3–1.2)
Total Protein: 4.9 g/dL — ABNORMAL LOW (ref 6.5–8.1)

## 2020-04-22 LAB — HEPATIC FUNCTION PANEL
ALT: 637 U/L — ABNORMAL HIGH (ref 0–44)
AST: 2164 U/L — ABNORMAL HIGH (ref 15–41)
Albumin: 1.7 g/dL — ABNORMAL LOW (ref 3.5–5.0)
Alkaline Phosphatase: 239 U/L — ABNORMAL HIGH (ref 38–126)
Bilirubin, Direct: 8.1 mg/dL — ABNORMAL HIGH (ref 0.0–0.2)
Indirect Bilirubin: 5.4 mg/dL — ABNORMAL HIGH (ref 0.3–0.9)
Total Bilirubin: 13.5 mg/dL — ABNORMAL HIGH (ref 0.3–1.2)
Total Protein: 5.3 g/dL — ABNORMAL LOW (ref 6.5–8.1)

## 2020-04-22 LAB — GLUCOSE, CAPILLARY
Glucose-Capillary: 90 mg/dL (ref 70–99)
Glucose-Capillary: 106 mg/dL — ABNORMAL HIGH (ref 70–99)
Glucose-Capillary: 97 mg/dL (ref 70–99)
Glucose-Capillary: 104 mg/dL — ABNORMAL HIGH (ref 70–99)
Glucose-Capillary: 103 mg/dL — ABNORMAL HIGH (ref 70–99)
Glucose-Capillary: 116 mg/dL — ABNORMAL HIGH (ref 70–99)
Glucose-Capillary: 96 mg/dL (ref 70–99)
Glucose-Capillary: 82 mg/dL (ref 70–99)

## 2020-04-22 LAB — CBC
HCT: 46.1 % — ABNORMAL HIGH (ref 36.0–46.0)
Hemoglobin: 12.3 g/dL (ref 12.0–15.0)
MCH: 31.5 pg (ref 26.0–34.0)
MCHC: 26.7 g/dL — ABNORMAL LOW (ref 30.0–36.0)
MCV: 117.9 fL — ABNORMAL HIGH (ref 80.0–100.0)
Platelets: 53 10*3/uL — ABNORMAL LOW (ref 150–400)
RBC: 3.91 MIL/uL (ref 3.87–5.11)
RDW: 31 % — ABNORMAL HIGH (ref 11.5–15.5)
WBC: 33.5 10*3/uL — ABNORMAL HIGH (ref 4.0–10.5)
nRBC: 12.5 % — ABNORMAL HIGH (ref 0.0–0.2)

## 2020-04-22 LAB — LACTIC ACID, PLASMA: Lactic Acid, Venous: >11 mmol/L (ref 0.5–1.9)

## 2020-04-22 LAB — PROTIME-INR
INR: 8 (ref 0.8–1.2)
Prothrombin Time: 67.5 seconds — ABNORMAL HIGH (ref 11.4–15.2)

## 2020-04-22 LAB — MAGNESIUM: Magnesium: 2.4 mg/dL (ref 1.7–2.4)

## 2020-04-22 MED ORDER — MAGNESIUM SULFATE 2 GM/50ML IV SOLN
2.0000 g | Freq: Once | INTRAVENOUS | Status: AC
  Administered 2020-04-22: 21:00:00 2 g via INTRAVENOUS
  Filled 2020-04-22: qty 50

## 2020-04-22 MED ORDER — CALCIUM GLUCONATE-NACL 1-0.675 GM/50ML-% IV SOLN
1.0000 g | Freq: Once | INTRAVENOUS | Status: AC
  Administered 2020-04-22: 1000 mg via INTRAVENOUS
  Filled 2020-04-22: qty 50

## 2020-04-22 NOTE — Progress Notes (Signed)
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 Recently admited for A/C heart failue in Feb and March of 2021 due to fluid and dietary indiscretion.   History of present illness    Not fully recovered physically after recent admission. Reported SOB. Hemoptysis yesterday x1 and x2-3 today. Was about to go see her orthopedic doctor when she slumped to chair at home, move to floor and 911 was called received bystander CPR for several minutes; pulse represent so this was stopped for about 1 minute during which EMS arrive and found her pulseless and apneic. ACLS for about 5 min. EPI x5, BICARB x1, Defib x1. King airway in the field, intubated in ED. Suspected to be respiratory driven with COPD and suspected OSA, worsening RHF.   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  Consults:  Cardiology, Nephrology, Neurology  Procedures:  4/20: Intubation  Significant Diagnostic Tests:   CT Chest (4/20) 1. Dense patchy opacity and consolidation in the posterior half of the left lung involving the left lower lobe and left upper lobe, compatible with pneumonia and atelectasis. 2. Milder patchy opacity in the right lung, primarily in the dependent right lower lobe, with underlying cylindrical bronchiectasis. This could be chronic or represent some superimposed infection. 3. Mild patchy ground-glass opacity in both lungs,  compatible with mild interstitial pulmonary edema/inflammation. 4. Small bilateral pleural effusions. 5. Small amount of free peritoneal fluid. 6. Multiple bilateral thyroid nodules, largest on the right measuring 2.5 cm in maximum diameter. Recommend thyroid US   CT Head (4/20) Negative for acute intracranial abnormalities   TTE (4/21) 1. Left ventricular ejection fraction, by estimation, is 55 to 60%. The  left ventricle has normal function. The left ventricle has no regional  wall motion abnormalities. There is mild concentric left ventricular  hypertrophy. Left ventricular diastolic  function could not be evaluated. There is the interventricular septum is  flattened in systole and diastole, consistent with right ventricular  pressure and volume overload.  2. RVSP underestimated in setting of severe TR. Right ventricular  systolic function is severely reduced. The right ventricular size is  severely enlarged. There is mildly elevated pulmonary artery systolic  pressure. The estimated right ventricular  systolic pressure is 01.6 mmHg.  3. Right atrial size was severely dilated.  4. Moderate pericardial effusion. The pericardial effusion is posterior  to the left ventricle.  5. The mitral valve is grossly normal. Trivial mitral valve  regurgitation. No evidence of mitral stenosis.  6. Tricuspid valve regurgitation is severe.  7. The aortic valve is tricuspid. Aortic valve regurgitation is not  visualized. Mild aortic valve sclerosis is present, with no evidence of  aortic valve stenosis.  8. The inferior vena cava is dilated in size with <50% respiratory  variability, suggesting right atrial pressure of 15 mmHg.   Micro Data:  4/20 Blood Cx: 1/4 GNR, species not on reflex 4/20 Urine Cx: E.  coli, 40,000, pan-sensitive  4/20 Resp Cx: Pending   Antimicrobials:  Vanc 4/20 - 4/21 Cefepime 4/20 >>  Interim history/subjective:   No significant overnight events. Had a  fairly lengthy discussion with pt's daughter and granddaughter this morning. They imply understanding that she has a poor neurological prognosis however are having difficulties letting go.  Objective   Blood pressure (!) 79/62, pulse (!) 110, temperature 97.9 F (36.6 C), resp. rate (!) 30, height 5\' 5"  (1.651 m), weight 109 kg, SpO2 94 %. CVP:  [16 mmHg] 16 mmHg  Vent Mode: PRVC FiO2 (%):  [40 %] 40 % Set Rate:  [30 bmp] 30 bmp Vt Set:  [450 mL] 450 mL PEEP:  [10 cmH20] 10 cmH20 Plateau Pressure:  [21 cmH20-28 cmH20] 24 cmH20   Intake/Output Summary (Last 24 hours) at 04/22/2020 0835 Last data filed at 04/22/2020 0800 Gross per 24 hour  Intake 5655.47 ml  Output 1571 ml  Net 4084.47 ml   Filed Weights   04/21/20 0344 04/21/20 1000 04/22/20 0500  Weight: 107.7 kg 107.7 kg 109 kg   Physical Exam General: critically ill appearing female HEENT: ETT Cardiac: tachycardic. irreg rhythm. Diffuse lower extremity edema extending to thighs.  Pulm: mechanical breath sounds bilaterally Abd: hypoactive bs Neuro: on minimal sedation. Sluggish pupillary response. Pupils equal and round. Disconjugate gaze. Does not blink to threat.  Skin: no rash. Compression wraps on LE Resolved Hospital Problem list   n/a  Assessment & Plan:   Acute on chronic Hypoxemic Respiratory Failure requiring mechanical ventilation. Suspect due to severe HF, COPD and untreated OSA. Mental status barring weaning/extubation Plan Continue MV. VAP bundle.  Vfib cardiac arrest s/p TTM (downtime approx 5-26min). Suspect respiratory etiology.  Cardiogenic shock Rewarming complete. Cardiology on board Continues to have soft pressures despite being on high dose pressor support  Paroxysmal atrial fibrillation with RVR Acute on chronic combined heart failure with severe RV failure Plan Continue rate control with amiodarone Continue pressor support with levo and vasopressin CRRT for volume removal however difficult  given her already soft pressures  Acute Encephalopathy likely due to global anoxic brain injury Neg CT head on admission. No seizure activity on EEG. Neurology consulted and agreeing with poor prognosis. Recommending stopping cefepime due to possible neurotoxicity. keppra d/c'd due to lack of epileptogenic findings on EEG On minimal sedation this morning with continued poor neurological function Palliative on board however family continuing to pursue full scope of care Plan - continue to wean sedation as able -if hemodynamic status improves, consider brain MRI  Septic shock superimposed on cardiogenic + blood and urine cultures with e.coli White count and Lactate remains elevated Plan:  -Continue pressor support with levo and vasopressin -solucortef 50mg  q6h -rocephin  -f/u repeat lactate this morning  # Acute on Chronic CKD III:  Suspect ATN.  Some improvement in renal function overnight. Remains anuric Lactic Acidosis Plan:  - Nephrology following, we appreciate their recommendations  - CRRT per Nephrology for volume removal  Shock Liver LFTs severely elevated and worse this morning compared to yesterday. AST [id number]; ALT [id number]. Bili 12.2>13.5. Suspect 2/2 to severe hypotension INR 8. No sign of active bleedign Plan:  -continue to work on pressure support -continue to follow labs  -hold eliquis  Chronic Thrombocytopenia (since 2009)  Continues to trend down. No sign of active bleeding. Plan:  - Trend platelets - Monitor for signs of additional active bleeding  Chronic Macrocytic Anemia  Stable and unchanged. B12 elevated Plan:  - Continue  to monitor  Hypoglycemia  Hx of T2DM Plan: Titrate D10 as needed to maintain CBG > 70.    Best practice:  Diet: NPO Pain/Anxiety/Delirium protocol (if indicated): Fentanyl gtt, Versed PRN VAP protocol (if indicated): Yes DVT prophylaxis: Holding heparin GI prophylaxis: PPI Glucose control: SSI Mobility: BR Code  Status: Full Family Communication: updated at bedside Disposition: ICU  Labs   CBC: Recent Labs  Lab 04/13/2020 1044 04/09/2020 1058 04/20/20 0007 04/20/20 0458 04/20/20 1555 04/20/20 1555 04/21/20 0320 04/21/20 0320 04/21/20 0516 04/21/20 1329 04/21/20 2015 04/22/20 0408 04/22/20 0455  WBC 18.1*   < > 19.7*  --  21.4*  --  27.3*  --   --   --  33.5* 35.3*  --   NEUTROABS 13.5*  --  17.2*  --  19.7*  --  23.0*  --   --   --   --  30.8*  --   HGB 11.6*   < > 12.2   < > 12.5   < > 12.8   < > 15.3* 15.6* 12.3 11.9* 15.0  HCT 42.4   < > 43.6   < > 45.2   < > 47.8*   < > 45.0 46.0 46.1* 44.1 44.0  MCV 116.2*   < > 109.8*  --  113.3*  --  114.9*  --   --   --  117.9* 116.4*  --   PLT 74*   < > 76*  --  80*  --  77*  --   --   --  53* 47*  --    < > = values in this interval not displayed.    Basic Metabolic Panel: Recent Labs  Lab 04/19/20 0313 04/19/20 0508 04/20/20 0442 04/20/20 0458 04/20/20 1535 04/20/20 1535 04/21/20 0320 04/21/20 0320 04/21/20 0516 04/21/20 1329 04/21/20 1510 04/22/20 0408 04/22/20 0455  NA 137   < > 137   < > 134*   < > 136   < > 135 137 138 138 138  K 4.1   < > 4.5   < > 4.7   < > 5.0   < > 4.6 4.2 4.7 4.8 4.2  CL 100   < > 99  --  100  --  98  --   --   --  101 99  --   CO2 23   < > 19*  --  18*  --  14*  --   --   --  16* 15*  --   GLUCOSE 211*   < > 71  --  290*  --  68*  --   --   --  151* 101*  --   BUN 64*   < > 51*  --  42*  --  36*  --   --   --  31* 25*  --   CREATININE 3.86*   < > 3.15*  --  2.97*  --  2.72*  --   --   --  2.47* 2.33*  --   CALCIUM 8.0*   < > 8.5*  --  8.4*  --  9.2  --   --   --  8.6* 8.2*  --   MG 2.1  --  2.3  --   --   --  2.5*  --   --   --   --  2.4  --   PHOS 7.1*   < > 6.1*  --  6.0*  --  6.5*  --   --   --  5.9* 5.4*  --    < > = values in this interval not displayed.   GFR: Estimated Creatinine Clearance: 31.9 mL/min (A) (by C-G formula based on SCr of 2.33 mg/dL (H)). Recent Labs  Lab 04/19/20 2032  04/20/20 0007 04/20/20 0442 04/20/20 1109 04/20/20 1535 04/20/20 1555 04/20/20 1950 04/20/20 2236 04/21/20 0320 04/21/20 2015 04/22/20 0408  PROCALCITON 14.96  --  13.42  --   --   --   --   --  9.03  --   --   WBC  --    < >  --   --   --  21.4*  --   --  27.3* 33.5* 35.3*  LATICACIDVEN 8.3*   < >  --    < > 9.7*  --  >11.0* >11.0* >11.0*  --   --    < > = values in this interval not displayed.    Liver Function Tests: Recent Labs  Lab 04/14/2020 1404 04/04/2020 1404 04/19/20 0313 04/19/20 0815 04/19/20 2032 04/20/20 0811 04/20/20 1535 04/21/20 0320 04/21/20 1510 04/22/20 0408  AST 34  --  45*  --   --  49*  --  126*  --  2,164*  ALT 28  --  32  --   --  38  --  66*  --  637*  ALKPHOS 184*  --  184*  --   --  221*  --  216*  --  239*  BILITOT 7.4*   < > 7.4* 7.4*  --  10.2*  --  12.2*  --  13.5*  PROT 5.3*  --  5.6*  --   --  6.4*  --  6.5  --  5.3*  ALBUMIN 1.9*   < > 1.9*  --    < > 2.2* 2.2* 2.3*  2.2* 2.0* 1.7*  1.7*   < > = values in this interval not displayed.   No results for input(s): LIPASE, AMYLASE in the last 168 hours. Recent Labs  Lab 04/21/20 0320  AMMONIA 43*    ABG    Component Value Date/Time   PHART 7.208 (L) 04/22/2020 0455   PCO2ART 39.9 04/22/2020 0455   PO2ART 88 04/22/2020 0455   HCO3 16.0 (L) 04/22/2020 0455   TCO2 17 (L) 04/22/2020 0455   ACIDBASEDEF 12.0 (H) 04/22/2020 0455   O2SAT 95.0 04/22/2020 0455     Coagulation Profile: Recent Labs  Lab 04/19/20 0313 04/20/20 0726 04/21/20 0320 04/21/20 2015 04/22/20 0408  INR 2.9* 2.9* 5.5* 11.3* 8.0*    Cardiac Enzymes: No results for input(s): CKTOTAL, CKMB, CKMBINDEX, TROPONINI in the last 168 hours.  HbA1C: Hgb A1c MFr Bld  Date/Time Value Ref Range Status  02/15/2020 01:12 PM 7.5 (H) 4.8 - 5.6 % Final    Comment:    (NOTE) Pre diabetes:          5.7%-6.4% Diabetes:              >6.4% Glycemic control for   <7.0% adults with diabetes   07/04/2012 05:23 AM 6.4 (H)  <5.7 % Final    Comment:    (NOTE)  According to the ADA Clinical Practice Recommendations for 2011, when HbA1c is used as a screening test:  >=6.5%   Diagnostic of Diabetes Mellitus           (if abnormal result is confirmed) 5.7-6.4%   Increased risk of developing Diabetes Mellitus References:Diagnosis and Classification of Diabetes Mellitus,Diabetes BBWN,7542,37(KCXWN 1):S62-S69 and Standards of Medical Care in         Diabetes - 2011,Diabetes PIOP,1068,16 (Suppl 1):S11-S61.    CBG: Recent Labs  Lab 04/21/20 2355 04/22/20 0205 04/22/20 0415 04/22/20 0612 04/22/20 [place], MD Internal Medicine Resident PGY 1 Pager # [phone] 04/22/20  9:10 AM

## 2020-04-22 NOTE — Progress Notes (Signed)
MD made aware of longer runs of VT and vent dyssynchrony. Verbal order to increase FiO2 from 40 to 60%.

## 2020-04-22 NOTE — Progress Notes (Signed)
NEURO HOSPITALIST PROGRESS NOTE   Subjective: Seen and examined.  Exam: Vitals:   04/22/20 0840 04/22/20 0845  BP:  (!) 85/64  Pulse:    Resp: (!) 30 (!) 30  Temp: 97.9 F (36.6 C) 97.9 F (36.6 C)  SpO2:      Physical Exam  Constitutional: Appears well-developed and well-nourished.  Eyes: Normal external eye and conjunctiva. HENT: Normocephalic, no lesions, without obvious abnormality.   Musculoskeletal-generalized pitting edema Cardiovascular: Normal rate and regular rhythm.  Respiratory: Vented GI: Soft.  No distension. There is no tenderness.  Skin: WDI  Neurological Examination Mental status: On fentanyl for sedation at 200 an hour, intubated. Does not open eyes to voice or noxious stimulation. Breathing with the ventilator No spontaneous movements Absent corneal reflexes bilaterally. Pupils 2 mm nonreactive bilaterally.  Disconjugate gaze with right exotropia. No movement to noxious stimulation    Medications:  Scheduled: . budesonide (PULMICORT) nebulizer solution  0.5 mg Nebulization BID  . chlorhexidine gluconate (MEDLINE KIT)  15 mL Mouth Rinse BID  . Chlorhexidine Gluconate Cloth  6 each Topical Daily  . hydrocortisone sod succinate (SOLU-CORTEF) inj  50 mg Intravenous Q6H  . ipratropium-albuterol  3 mL Nebulization Q6H  . mouth rinse  15 mL Mouth Rinse 10 times per day  . pantoprazole (PROTONIX) IV  40 mg Intravenous QHS   Continuous: .  prismasol BGK 4/2.5 500 mL/hr at 04/22/20 0617  .  prismasol BGK 4/2.5 200 mL/hr at 04/21/20 1005  . sodium chloride Stopped (04/22/20 0930)  . amiodarone 30 mg/hr (04/22/20 1000)  . cefTRIAXone (ROCEPHIN)  IV Stopped (04/22/20 0758)  . dextrose Stopped (04/21/20 1603)  . feeding supplement (VITAL HIGH PROTEIN) Stopped (04/21/20 0759)  . fentaNYL infusion INTRAVENOUS 200 mcg/hr (04/22/20 1000)  . levETIRAcetam Stopped (04/22/20 0418)  . norepinephrine (LEVOPHED) Adult infusion 40 mcg/min  (04/22/20 1000)  . prismasol BGK 4/2.5 1,000 mL/hr at 04/22/20 0600  . sodium bicarbonate 150 mEq in dextrose 5% 1000 mL 125 mL/hr at 04/22/20 1000  . vasopressin (PITRESSIN) infusion - *FOR SHOCK* 0.03 Units/min (04/22/20 1000)   ZOX:[id number] tears, docusate sodium, fentaNYL, heparin, heparin, midazolam, polyethylene glycol  Pertinent Labs/Diagnostics:   CT ABDOMEN PELVIS WO CONTRAST  Result Date: 04/21/2020 CLINICAL DATA:  Septic shock. Worsening lactic acidosis. Concern for bowel ischemia. EXAM: CT ABDOMEN AND PELVIS WITHOUT CONTRAST TECHNIQUE: Multidetector CT imaging of the abdomen and pelvis was performed following the standard protocol without IV contrast. COMPARISON:  None. FINDINGS: Lower chest: Mild basilar atelectasis. Hepatobiliary: No focal hepatic lesion on noncontrast exam Pancreas: No pancreatic inflammation identified. Pancreatic atrophy. No duct dilatation or inflammation. Spleen: Normal spleen Adrenals/urinary tract: Adrenal glands are poorly evaluated. Kidneys grossly normal noncontrast exam. Ureters bladder grossly normal. Foley catheter within the bladder. Stomach/Bowel: NG tube in stomach. Duodenum small-bowel appear normal. The small bowel is chronic collapse. No evidence of pneumatosis. The ascending colon is collapsed. Transverse colon descending colon normal. Rectum normal. No pneumatosis. No portal venous gas. Vascular/Lymphatic: Scattered intimal calcifications of the aorta. No aneurysm. LEFT internal iliac artery is dilated 1.6 cm Reproductive: Post hysterectomy anatomy ovaries not appreciated Other: Small amount free fluid in the anterior pelvis and lower abdominal mesentery (image 61/3). There is anasarca of the soft tissues. Musculoskeletal: No aggressive osseous lesion. Increased density of the vertebral bodies suggest medical renal disease. IMPRESSION: 1. No evidence of bowel ischemia on noncontrast  CT. No pneumatosis, portal venous gas or bowel distension. The bowel  is predominantly collapsed. NG tube in stomach 2. No evidence of infection in the abdomen pelvis on noncontrast exam. 3. Small amount of intraperitoneal free fluid is present in the lower pelvis and mesentery. 4. Anasarca soft tissues. 5. Vertebral body sclerosis suggests medical renal disease. Electronically Signed   By: Suzy Bouchard M.D.   On: 04/21/2020 09:49   DG Chest Port 1 View  Result Date: 04/21/2020 CLINICAL DATA:  Acute respiratory failure. EXAM: PORTABLE CHEST 1 VIEW COMPARISON:  Radiograph 04/19/2020 FINDINGS: Borderline low positioning of the endotracheal tube 14 mm from the carina. Enteric tube in place tip below the diaphragm not included in the field of view. Right internal jugular central venous catheter tip projects over the upper SVC. Stable cardiomegaly. Aortic atherosclerosis. Improving left upper lobe aeration. There are fine reticular opacities throughout both lungs. Small pleural effusions. No pneumothorax. IMPRESSION: 1. Borderline low positioning of the endotracheal tube 14 mm from the carina. 2. Improving left upper lobe aeration. 3. Stable cardiomegaly. Small pleural effusions. Fine reticular opacities throughout both lungs may represent edema. Electronically Signed   By: Keith Rake M.D.   On: 04/21/2020 03:02    Laurey Morale, MSN, NP-C [facility] [phone]  Attending neurologist's note to follow  Attending addendum Patient seen and examined Imaging reviewed independently  Assessment: Tracy [name] is an 59 y.o. 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  On exam patient has no brainstem reflexes present on my exam but she is on 200 of fentanyl an hour. I suspect given her EEG findings and her clinical exam, that she has sustained significant hypoxic/anoxic brain damage. She also continues to have  progressive lactic acidosis and no source is being currently worked up. Return of the pictures consistent with multiorgan failure including hypoxic/anoxic brain injury and I suspect that she would have a rather poor outcome.  Impression Suspect global hypoxic/anoxic brain injury Severe toxic metabolic encephalopathy  Recommendations: Given her poor exam, I would not start her on any antiepileptics because EEG findings do not look like features consistent with subclinical status but rather are suggestive of global anoxic brain injury. Repeat head imaging might be helpful but I suspect based on my exam that she will have very poor chance of any neurologically meaningful recovery going forward and I fear that she might progress to brain death in the next few days given all the other comorbidities. Given her other comorbidities, palliative care is involved with conversations and I have also introduced the concept of possible comfort measures. The daughter and granddaughter at bedside were understanding in going to talk amongst themselves Relayed my plan to Dr. Lake Bells on the unit  04/22/2020, 10:22 AM   -- Amie Portland, MD [facility] Pager: [phone] If 7pm to 7am, please call on call as listed on AMION.  CRITICAL CARE ATTESTATION Performed by: Amie Portland, MD Total critical care time: 35 minutes Critical care time was exclusive of separately billable procedures and treating other patients and/or supervising APPs/Residents/Students Critical care was necessary to treat or prevent imminent or life-threatening deterioration due to status post cardiac arrest possible anoxic brain injury. This patient is critically ill and at significant risk for neurological worsening and/or death and care requires constant monitoring. Critical care was time spent personally by me on the following activities: development of treatment  plan with patient and/or surrogate as well as nursing,  discussions with consultants, evaluation of patient's response to treatment, examination of patient, obtaining history from patient or surrogate, ordering and performing treatments and interventions, ordering and review of laboratory studies, ordering and review of radiographic studies, pulse oximetry, re-evaluation of patient's condition, participation in multidisciplinary rounds and medical decision making of high complexity in the care of this patient.

## 2020-04-22 NOTE — Progress Notes (Addendum)
eLink Physician-Brief Progress Note Patient Name: Tracy [name] DOB: [birth year] MRN: [id number]   Date of Service  04/22/2020  HPI/Events of Note  RN reports runs of VT in this critically ill patient. They are already on amio drip at 0.5mg /min. K and Mg were normal when last checked. On CRRT for severe acidosis.   eICU Interventions  Check ABG, iCal, CMP now. Ordered 2g MgSO4 empirically. Increase amiodarone to 1mg /min.     Intervention Category Major Interventions: Arrhythmia - evaluation and management  Marily Lente Thaniel Coluccio 04/22/2020, 8:12 PM   ADDENDUM:  ABG unchanged from prior, although given PaO2 of 69 (Sat 91%) in setting of ongoing arrhythmias, we will increase FiO2 to 0.6 (currently 0.4) -- especially since we are unable to pick up an SpO2 reading on her.  iCal 1.03 so 1g calcium gluconate given.

## 2020-04-22 NOTE — Consult Note (Signed)
Checked back on pt and family status after referral from night chaplain. Consulted with nurse, who said family made pt DNR this morning and talked to palliative, and is otherwise managing the care pt has already been receiving. Pt's daughter (who works here) and her daughter from [place] had been with pt through the night and left a while ago to get some sleep. I visited with pt's cousin and significant other bedside, offering spiritual/emotional support and prayer. They were appreciative. Family is [place]. I let them know they can always ask the nurse to page for a chaplain if desired.   Rev. Eloise Levels Chaplain

## 2020-04-22 NOTE — Progress Notes (Signed)
   Palliative Medicine Inpatient Follow Up Note   HPI: 59 y.o. 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 coli bacteremia. Ongoing treatment for septic and cardiogenic shock and s/p hypothermia protocol. Concern for poor neurological status.   Today's Discussion (04/22/2020): Chart reviewed. Talked to overnight RN who shared that patients daughter and granddaughter had stayed throughout the evening. Comfort measures had been explained to them in detail versus continuing her present course. Overnight Tracy [name]'s family opted to continue all interventions. Her pressures remained soft throughout the night. It was explained to the patients family that she will likely pass away despite all interventions which they appear to have understood.  I met with Tracy [name] and her daughter at bedside. I shared with them that Tracy [name]'s body had endured quite a lot. We reviewed that one body can only sustain for so long before the inevitable occurs which in her case would be death. I again, introduce the concept of comfort. I shared that to continue doing what is presently being done could contribute to greater suffering for the patient. Despite this knowledge the family would like to continue all present interventions. I provided my direct contact information in the event that they change their minds so that we may aid in making Tracy [name] comfortable in completing her final journey on earth.   Discussed the importance of continued conversation with family and their  medical providers regarding overall plan of care and treatment options, ensuring decisions are within the context of the patients values and GOCs.  Questions and concerns addressed   Vital Signs Vitals:   04/22/20 0615  04/22/20 0630  BP: (!) 78/62 (!) 79/62  Pulse: (!) 116 (!) 121  Resp: (!) 30 (!) 30  Temp: 97.9 F (36.6 C) 97.9 F (36.6 C)  SpO2: 96% 95%    Intake/Output Summary (Last 24 hours) at 04/22/2020 3128 Last data filed at 04/22/2020 0700 Gross per 24 hour  Intake 5700.13 ml  Output 2562 ml  Net 3138.13 ml   Last Weight  Most recent update: 04/22/2020  6:00 AM   Weight  109 kg (240 lb 4.8 oz)           SUMMARY OF RECOMMENDATIONS   - Strongly recommended considering a comfort emphasis of care - Ongoing goals of care conversations - No changes in care plan today  Time Spent: 25 Greater than 50% of the time was spent in counseling and coordination of care ______________________________________________________________________________________ [place] Team Team Cell Phone: [phone] Please utilize secure chat with additional questions, if there is no response within 30 minutes please call the above phone number  Palliative Medicine Team providers are available by phone from 7am to 7pm daily and can be reached through the team cell phone.  Should this patient require assistance outside of these hours, please call the patient's attending physician.

## 2020-04-22 NOTE — Progress Notes (Signed)
Called Elink to verify calcium gluconate to be given through peripheral IV in left hand.

## 2020-04-22 NOTE — Procedures (Signed)
Admit: 04/01/2020 LOS: 4  48F s/p cardiac arrest, anoxic brain injury, chronic pulm HTN; shock, AoCKD4  Current CRRT Prescription: Start Date: 04/19/20 Catheter: IJ Temp HD cath BFR: 200 Pre Blood Pump: 400 4K DFR: 1000 4K Replacement Rate: 200 4K Goal UF: running net positive given hypotension Anticoagulation: none in circuit Clotting: infrequent  S: Max NE/VP Not able to pull fluid K 4.2, P 5.4 Family at bedside, grasp status, want to cont current measures Palliative working with them Acidotic, rec IV HCO3 as well Inc LFTs, INR 11  O: 04/23 0701 - 04/24 0700 In: 5700.1 [I.V.:5245; NG/GT:59; IV Piggyback:396.2] Out: 2562 [Urine:5; Emesis/NG output:1700]  Filed Weights   04/21/20 0344 04/21/20 1000 04/22/20 0500  Weight: 107.7 kg 107.7 kg 109 kg    Recent Labs  Lab 04/21/20 0320 04/21/20 0516 04/21/20 1510 04/22/20 0408 04/22/20 0455  NA 136   < > 138 138 138  K 5.0   < > 4.7 4.8 4.2  CL 98  --  101 99  --   CO2 14*  --  16* 15*  --   GLUCOSE 68*  --  151* 101*  --   BUN 36*  --  31* 25*  --   CREATININE 2.72*  --  2.47* 2.33*  --   CALCIUM 9.2  --  8.6* 8.2*  --   PHOS 6.5*  --  5.9* 5.4*  --    < > = values in this interval not displayed.   Recent Labs  Lab 04/20/20 1555 04/20/20 1555 04/21/20 0320 04/21/20 0516 04/21/20 2015 04/22/20 0408 04/22/20 0455  WBC 21.4*   < > 27.3*  --  33.5* 35.3*  --   NEUTROABS 19.7*  --  23.0*  --   --  30.8*  --   HGB 12.5   < > 12.8   < > 12.3 11.9* 15.0  HCT 45.2   < > 47.8*   < > 46.1* 44.1 44.0  MCV 113.3*   < > 114.9*  --  117.9* 116.4*  --   PLT 80*   < > 77*  --  53* 47*  --    < > = values in this interval not displayed.    Scheduled Meds: . budesonide (PULMICORT) nebulizer solution  0.5 mg Nebulization BID  . chlorhexidine gluconate (MEDLINE KIT)  15 mL Mouth Rinse BID  . Chlorhexidine Gluconate Cloth  6 each Topical Daily  . hydrocortisone sod succinate (SOLU-CORTEF) inj  50 mg Intravenous Q6H  .  ipratropium-albuterol  3 mL Nebulization Q6H  . mouth rinse  15 mL Mouth Rinse 10 times per day  . pantoprazole (PROTONIX) IV  40 mg Intravenous QHS   Continuous Infusions: .  prismasol BGK 4/2.5 500 mL/hr at 04/22/20 0617  .  prismasol BGK 4/2.5 200 mL/hr at 04/21/20 1005  . sodium chloride Stopped (04/22/20 0930)  . amiodarone 30 mg/hr (04/22/20 1000)  . cefTRIAXone (ROCEPHIN)  IV Stopped (04/22/20 0758)  . dextrose Stopped (04/21/20 1603)  . feeding supplement (VITAL HIGH PROTEIN) Stopped (04/21/20 0759)  . fentaNYL infusion INTRAVENOUS 200 mcg/hr (04/22/20 1000)  . levETIRAcetam Stopped (04/22/20 0418)  . norepinephrine (LEVOPHED) Adult infusion 40 mcg/min (04/22/20 1000)  . prismasol BGK 4/2.5 1,000 mL/hr at 04/22/20 0600  . sodium bicarbonate 150 mEq in dextrose 5% 1000 mL 125 mL/hr at 04/22/20 1000  . vasopressin (PITRESSIN) infusion - *FOR SHOCK* 0.03 Units/min (04/22/20 1000)   PRN Meds:.artificial tears, docusate sodium, fentaNYL, heparin, heparin, midazolam,  polyethylene glycol  ABG    Component Value Date/Time   PHART 7.208 (L) 04/22/2020 0455   PCO2ART 39.9 04/22/2020 0455   PO2ART 88 04/22/2020 0455   HCO3 16.0 (L) 04/22/2020 0455   TCO2 17 (L) 04/22/2020 0455   ACIDBASEDEF 12.0 (H) 04/22/2020 0455   O2SAT 95.0 04/22/2020 0455    A/P  1. Dialysis dependent AoCKD4, anuric; on CRRT all 4K fluids, not able to tolerate UF 2/2 hypotension 2. Shock on NE/VP, per CCM; mixed 3. Cardiac arrest s/p TTM 4. Anoxic brain injury 5. Shock liver 6. pulm HTN/ R sided HF 7. Persistent metabolic acidosis on CRRT and NaHCO3 gtt 8. GNR bacteremia  Cont supportive care, CRRT at current settings.  Exceedingly poor outlook, discussed with family.    Pearson Grippe, MD [facility] pgr [phone]

## 2020-04-23 ENCOUNTER — Encounter (HOSPITAL_COMMUNITY): Payer: Self-pay | Admitting: Pulmonary Disease

## 2020-04-23 DIAGNOSIS — J9621 Acute and chronic respiratory failure with hypoxia: Principal | ICD-10-CM

## 2020-04-23 DIAGNOSIS — Z9911 Dependence on respirator [ventilator] status: Secondary | ICD-10-CM

## 2020-04-23 DIAGNOSIS — I5021 Acute systolic (congestive) heart failure: Secondary | ICD-10-CM

## 2020-04-23 DIAGNOSIS — J449 Chronic obstructive pulmonary disease, unspecified: Secondary | ICD-10-CM

## 2020-04-23 DIAGNOSIS — R579 Shock, unspecified: Secondary | ICD-10-CM

## 2020-04-23 DIAGNOSIS — R7401 Elevation of levels of liver transaminase levels: Secondary | ICD-10-CM

## 2020-04-23 LAB — RENAL FUNCTION PANEL
Albumin: 1.6 g/dL — ABNORMAL LOW (ref 3.5–5.0)
Albumin: 1.4 g/dL — ABNORMAL LOW (ref 3.5–5.0)
Anion gap: 21 — ABNORMAL HIGH (ref 5–15)
Anion gap: 21 — ABNORMAL HIGH (ref 5–15)
BUN: 17 mg/dL (ref 6–20)
BUN: 14 mg/dL (ref 6–20)
CO2: 17 mmol/L — ABNORMAL LOW (ref 22–32)
CO2: 17 mmol/L — ABNORMAL LOW (ref 22–32)
Calcium: 8.2 mg/dL — ABNORMAL LOW (ref 8.9–10.3)
Calcium: 7.9 mg/dL — ABNORMAL LOW (ref 8.9–10.3)
Chloride: 98 mmol/L (ref 98–111)
Chloride: 97 mmol/L — ABNORMAL LOW (ref 98–111)
Creatinine, Ser: 1.89 mg/dL — ABNORMAL HIGH (ref 0.44–1.00)
Creatinine, Ser: 1.67 mg/dL — ABNORMAL HIGH (ref 0.44–1.00)
GFR calc Af Amer: 33 mL/min — ABNORMAL LOW (ref >60)
GFR calc Af Amer: 38 mL/min — ABNORMAL LOW (ref >60)
GFR calc non Af Amer: 29 mL/min — ABNORMAL LOW (ref >60)
GFR calc non Af Amer: 33 mL/min — ABNORMAL LOW (ref >60)
Glucose, Bld: 101 mg/dL — ABNORMAL HIGH (ref 70–99)
Glucose, Bld: 227 mg/dL — ABNORMAL HIGH (ref 70–99)
Phosphorus: 5.2 mg/dL — ABNORMAL HIGH (ref 2.5–4.6)
Phosphorus: 4.3 mg/dL (ref 2.5–4.6)
Potassium: 5 mmol/L (ref 3.5–5.1)
Potassium: 4.9 mmol/L (ref 3.5–5.1)
Sodium: 136 mmol/L (ref 135–145)
Sodium: 135 mmol/L (ref 135–145)

## 2020-04-23 LAB — GLUCOSE, CAPILLARY
Glucose-Capillary: 86 mg/dL (ref 70–99)
Glucose-Capillary: 72 mg/dL (ref 70–99)
Glucose-Capillary: 47 mg/dL — ABNORMAL LOW (ref 70–99)
Glucose-Capillary: 43 mg/dL — CL (ref 70–99)
Glucose-Capillary: 203 mg/dL — ABNORMAL HIGH (ref 70–99)
Glucose-Capillary: 118 mg/dL — ABNORMAL HIGH (ref 70–99)
Glucose-Capillary: 181 mg/dL — ABNORMAL HIGH (ref 70–99)

## 2020-04-23 LAB — CBC WITH DIFFERENTIAL/PLATELET
Abs Immature Granulocytes: 2.01 10*3/uL — ABNORMAL HIGH (ref 0.00–0.07)
Basophils Absolute: 0.2 10*3/uL — ABNORMAL HIGH (ref 0.0–0.1)
Basophils Relative: 1 %
Eosinophils Absolute: 5.1 10*3/uL — ABNORMAL HIGH (ref 0.0–0.5)
Eosinophils Relative: 14 %
HCT: 43.5 % (ref 36.0–46.0)
Hemoglobin: 11.8 g/dL — ABNORMAL LOW (ref 12.0–15.0)
Immature Granulocytes: 5 %
Lymphocytes Relative: 6 %
Lymphs Abs: 2.2 10*3/uL (ref 0.7–4.0)
MCH: 31.7 pg (ref 26.0–34.0)
MCHC: 27.1 g/dL — ABNORMAL LOW (ref 30.0–36.0)
MCV: 116.9 fL — ABNORMAL HIGH (ref 80.0–100.0)
Monocytes Absolute: 0.5 10*3/uL (ref 0.1–1.0)
Monocytes Relative: 1 %
Neutro Abs: 27.7 10*3/uL — ABNORMAL HIGH (ref 1.7–7.7)
Neutrophils Relative %: 73 %
Platelets: 33 10*3/uL — ABNORMAL LOW (ref 150–400)
RBC: 3.72 MIL/uL — ABNORMAL LOW (ref 3.87–5.11)
WBC: 37.7 10*3/uL — ABNORMAL HIGH (ref 4.0–10.5)
nRBC: 23.8 % — ABNORMAL HIGH (ref 0.0–0.2)

## 2020-04-23 LAB — HEPATIC FUNCTION PANEL
ALT: 1117 U/L — ABNORMAL HIGH (ref 0–44)
AST: 2832 U/L — ABNORMAL HIGH (ref 15–41)
Albumin: 1.5 g/dL — ABNORMAL LOW (ref 3.5–5.0)
Alkaline Phosphatase: 296 U/L — ABNORMAL HIGH (ref 38–126)
Bilirubin, Direct: 9.1 mg/dL — ABNORMAL HIGH (ref 0.0–0.2)
Indirect Bilirubin: 6.7 mg/dL — ABNORMAL HIGH (ref 0.3–0.9)
Total Bilirubin: 15.8 mg/dL — ABNORMAL HIGH (ref 0.3–1.2)
Total Protein: 4.7 g/dL — ABNORMAL LOW (ref 6.5–8.1)

## 2020-04-23 LAB — PROTIME-INR
INR: 5.9 (ref 0.8–1.2)
Prothrombin Time: 52.8 seconds — ABNORMAL HIGH (ref 11.4–15.2)

## 2020-04-23 LAB — CULTURE, BLOOD (ROUTINE X 2): Culture: NO GROWTH

## 2020-04-23 LAB — MAGNESIUM: Magnesium: 2.7 mg/dL — ABNORMAL HIGH (ref 1.7–2.4)

## 2020-04-23 LAB — AMMONIA: Ammonia: 44 umol/L — ABNORMAL HIGH (ref 9–35)

## 2020-04-23 MED ORDER — DEXTROSE 50 % IV SOLN
INTRAVENOUS | Status: AC
  Administered 2020-04-23: 50 mL via INTRAVENOUS
  Filled 2020-04-23: qty 50

## 2020-04-23 MED ORDER — DEXTROSE 50 % IV SOLN: 1.0000 | Freq: Once | INTRAVENOUS | Status: AC

## 2020-04-23 MED ORDER — FENTANYL 2500MCG IN NS 250ML (10MCG/ML) PREMIX INFUSION
0.0000 ug/h | INTRAVENOUS | Status: DC
  Administered 2020-04-23: 25 ug/h via INTRAVENOUS
  Filled 2020-04-23: qty 250

## 2020-04-23 MED ORDER — DEXTROSE 10 % IV SOLN: INTRAVENOUS | Status: DC

## 2020-04-23 MED ORDER — DEXTROSE 50 % IV SOLN
INTRAVENOUS | Status: AC
  Filled 2020-04-23: qty 50

## 2020-04-23 NOTE — Plan of Care (Signed)
Called by the RN because patient is dyssynchronous with the vent. In the morning, she was not very dyssynchronous but after sedation was held for multiple hours, some vent dyssynchrony was observed. Remains unchanged on the exam with the exception of breathing over the vent and having minimal dyssynchrony. Minimally reactive pupils, disconjugate gaze, no blink to threat.  No response to voice.  No response to noxious stimulation other than opening eyes.  Recommendations as before.  -- Amie Portland, MD [facility] Pager: [phone] If 7pm to 7am, please call on call as listed on AMION.

## 2020-04-23 NOTE — Progress Notes (Signed)
Neurology Progress Note   S:// Seen and examined. No sedation since 7 AM.  O:// Current vital signs: BP (!) 83/70   Pulse 90   Temp (!) 97.2 F (36.2 C)   Resp (!) 29   Ht '5\' 5"'  (1.651 m)   Wt 113.1 kg   SpO2 99%   BMI 41.49 kg/m  Vital signs in last 24 hours: Temp:  [95.9 F (35.5 C)-97.9 F (36.6 C)] 97.2 F (36.2 C) (04/25 1145) Pulse Rate:  [90-108] 90 (04/25 1216) Resp:  [14-32] 29 (04/25 1216) BP: (75-108)/(56-90) 83/70 (04/25 1216) SpO2:  [99 %] 99 % (04/24 1600) FiO2 (%):  [40 %-60 %] 60 % (04/25 1216) Weight:  [113.1 kg] 113.1 kg (04/25 0630) GENERAL: Intubated, no sedation since 7 AM. HEENT: - Normocephalic and atraumatic, dry mm, no LN++, no Thyromegally LUNGS -vented, breathing with the ventilator CV - S1S2 RRR, no m/r/g, equal pulses bilaterally. ABDOMEN -distended abdomen Ext: Both lower extremities in bandages with lymphedema. NEURO:  Mental Status: Intubated, sedation with fentanyl held since 7 AM. No spontaneous movements.  Does not follow commands.  Does not open eyes to voice. Eye-opening to noxious stimulation was noted today. Cranial Nerves: PERRL, eyes are disconjugate, does not blink to threat from either side, facial symmetry difficult to ascertain due to the endotracheal tube. Motor: With noxious stimulation, gags but no withdrawal or flexion/extension noted. Tone: is flaccid Sensation-no response to noxious stimulation. Coordination: Cannot test Gait- deferred  Medications  Current Facility-Administered Medications:  .   prismasol BGK 4/2.5 infusion, , CRRT, Continuous, Sanford, Ryan B, MD, Last Rate: 500 mL/hr at 04/23/20 0228, New Bag at 04/23/20 0228 .   prismasol BGK 4/2.5 infusion, , CRRT, Continuous, Sanford, Ryan B, MD, Last Rate: 200 mL/hr at 04/21/20 1005, New Bag at 04/21/20 1005 .  0.9 %  sodium chloride infusion, 250 mL, Intravenous, Continuous, Kipp Brood, MD, Stopped at 04/23/20 0159 .  [COMPLETED] amiodarone (NEXTERONE)  1.8 mg/mL load via infusion 150 mg, 150 mg, Intravenous, Once, 150 mg at 04/19/20 1537 **FOLLOWED BY** [EXPIRED] amiodarone (NEXTERONE PREMIX) 360-4.14 MG/200ML-% (1.8 mg/mL) IV infusion, 60 mg/hr, Intravenous, Continuous, Stopped at 04/20/20 0723 **FOLLOWED BY** amiodarone (NEXTERONE PREMIX) 360-4.14 MG/200ML-% (1.8 mg/mL) IV infusion, 60 mg/hr, Intravenous, Continuous, Stretch, Marily Lente, MD, Last Rate: 33.3 mL/hr at 04/23/20 1104, 60 mg/hr at 04/23/20 1104 .  artificial tears (LACRILUBE) ophthalmic ointment, , Both Eyes, Q4H PRN, Collene Gobble, MD, 2 application at 27/06/23 0801 .  budesonide (PULMICORT) nebulizer solution 0.5 mg, 0.5 mg, Nebulization, BID, Jose Persia, MD, 0.5 mg at 04/23/20 0808 .  cefTRIAXone (ROCEPHIN) 2 g in sodium chloride 0.9 % 100 mL IVPB, 2 g, Intravenous, Q24H, Santos-Sanchez, Idalys, MD, Stopped at 04/22/20 1659 .  chlorhexidine gluconate (MEDLINE KIT) (PERIDEX) 0.12 % solution 15 mL, 15 mL, Mouth Rinse, BID, Agarwala, Ravi, MD, 15 mL at 04/23/20 0812 .  Chlorhexidine Gluconate Cloth 2 % PADS 6 each, 6 each, Topical, Daily, Agarwala, Ravi, MD, 6 each at 04/23/20 0600 .  dextrose 10 % infusion, , Intravenous, Continuous, Byrum, Rose Fillers, MD, Stopped at 04/21/20 1603 .  docusate sodium (COLACE) capsule 100 mg, 100 mg, Oral, BID PRN, Neva Seat, MD .  feeding supplement (VITAL HIGH PROTEIN) liquid 1,000 mL, 1,000 mL, Per Tube, Continuous, Byrum, Rose Fillers, MD, Stopped at 04/21/20 0759 .  heparin injection 1,000-6,000 Units, 1,000-6,000 Units, CRRT, PRN, Pearson Grippe B, MD .  heparinized saline (2000 units/L) primer fluid for CRRT, , CRRT, PRN, Joelyn Oms,  Meredith Leeds, MD .  hydrocortisone sodium succinate (SOLU-CORTEF) 100 MG injection 50 mg, 50 mg, Intravenous, Q6H, Byrum, Rose Fillers, MD, 50 mg at 04/23/20 1004 .  ipratropium-albuterol (DUONEB) 0.5-2.5 (3) MG/3ML nebulizer solution 3 mL, 3 mL, Nebulization, Q6H, Santos-Sanchez, Idalys, MD, 3 mL at 04/23/20 0808 .  MEDLINE  mouth rinse, 15 mL, Mouth Rinse, 10 times per day, Agarwala, Ravi, MD, 15 mL at 04/23/20 1141 .  norepinephrine (LEVOPHED) 16 mg in 249m premix infusion, 0-40 mcg/min, Intravenous, Titrated, Byrum, RRose Fillers MD, Last Rate: 37.5 mL/hr at 04/23/20 1100, 40 mcg/min at 04/23/20 1100 .  pantoprazole (PROTONIX) injection 40 mg, 40 mg, Intravenous, QFritzi Mandes MD, 40 mg at 04/22/20 2238 .  polyethylene glycol (MIRALAX / GLYCOLAX) packet 17 g, 17 g, Oral, Daily PRN, MNeva Seat MD .  prismasol BGK 4/2.5 infusion, , CRRT, Continuous, SPearson GrippeB, MD, Last Rate: 1,000 mL/hr at 04/23/20 1105, New Bag at 04/23/20 1105 .  sodium bicarbonate 150 mEq in dextrose 5% 1000 mL infusion, 150 mEq, Intravenous, Continuous, Byrum, RRose Fillers MD, Last Rate: 125 mL/hr at 04/23/20 1100, Rate Verify at 04/23/20 1100 .  vasopressin (PITRESSIN) 40 Units in sodium chloride 0.9 % 250 mL (0.16 Units/mL) infusion, 0.03 Units/min, Intravenous, Continuous, Byrum, RRose Fillers MD, Last Rate: 11.25 mL/hr at 04/23/20 1100, 0.03 Units/min at 04/23/20 1100 Labs CBC    Component Value Date/Time   WBC 37.7 (H) 04/23/2020 0404   RBC 3.72 (L) 04/23/2020 0404   HGB 11.8 (L) 04/23/2020 0404   HCT 43.5 04/23/2020 0404   HCT 39.1 04/19/2020 2032   PLT 33 (L) 04/23/2020 0404   MCV 116.9 (H) 04/23/2020 0404   MCH 31.7 04/23/2020 0404   MCHC 27.1 (L) 04/23/2020 0404   RDW Not Measured 04/23/2020 0404   LYMPHSABS 2.2 04/23/2020 0404   MONOABS 0.5 04/23/2020 0404   EOSABS 5.1 (H) 04/23/2020 0404   BASOSABS 0.2 (H) 04/23/2020 0404    CMP     Component Value Date/Time   NA 136 04/23/2020 0404   K 5.0 04/23/2020 0404   CL 98 04/23/2020 0404   CO2 17 (L) 04/23/2020 0404   GLUCOSE 101 (H) 04/23/2020 0404   BUN 17 04/23/2020 0404   CREATININE 1.89 (H) 04/23/2020 0404   CALCIUM 8.2 (L) 04/23/2020 0404   PROT 4.7 (L) 04/23/2020 0404   ALBUMIN 1.6 (L) 04/23/2020 0404   ALBUMIN 1.5 (L) 04/23/2020 0404   AST 2,832 (H)  04/23/2020 0404   ALT 1,117 (H) 04/23/2020 0404   ALKPHOS 296 (H) 04/23/2020 0404   BILITOT 15.8 (H) 04/23/2020 0404   GFRNONAA 29 (L) 04/23/2020 0404   GFRAA 33 (L) 04/23/2020 0404   Imaging I have reviewed images in epic and the results pertinent to this consultation are: CT-scan of the brain-done on admission with no acute changes.  No follow-up imaging. MRI examination of the brain has been recommended but is pending to to instability of the patient's clinical status making her not amenable for transportation.  EEG suggestive of anoxic injury  Assessment: 59year old past history of COPD on 3 L home oxygen, severe right heart failure, paroxysmal A. fib, CKD, recurrent DVT on Eliquis, diabetes, hypertension, hyperlipidemia, strokes in the past, heart failure with preserved ejection fraction, presented status post cardiac arrest. Examination has remained poor and despite of being on hypothermia protocol, after being rewarmed, has not exhibited any meaningful response on examination neurologically. Continues to have multiple derangements including possible septic and cardiogenic shock with lactic  acidosis, acute on chronic CKD-remains on CRRT with creatinine stabilizing, shock liver, hypoglycemia-in spite of receiving D50 sugars are dropping down to 40s. Given her current clinical picture with multiorgan failure and cardiac arrest in the setting of pre-existing serious comorbidities, and now multiple days post arrest without any meaningful improvement in neurological exam, the chances of neurologically meaningful recovery continue to be grim with progression to becoming even worse prognosis as more number of days past. I had a detailed conversation with the family regarding my examination and impression.  Impression: Global anoxic brain injury Deranged liver function Deranged kidney function  Recommendations: -Detailed discussion with the family regarding this likely being a neurologically  irreversible global anoxic injury -Supportive care per primary team as you are -Continue goals of care conversations as you are along with palliative. -Consider MRI of the brain to visually assess for extent of anoxic brain injury -her current condition precludes transport due to multiple drips pumps and CRRT. -EEG already done and suggestive of anoxic injury. -Discussed the plan with the family-daughter and son Germain Osgood at the bedside. -Discussed the plan with Dr. Ernest Mallick of the critical care service. -I will sign out her care to my oncoming partner tomorrow as we transition service. -Neurology will follow.  -- Amie Portland, MD [facility] Pager: [phone] If 7pm to 7am, please call on call as listed on AMION.  CRITICAL CARE ATTESTATION Performed by: Amie Portland, MD Total critical care time: 45 minutes Critical care time was exclusive of separately billable procedures and treating other patients and/or supervising APPs/Residents/Students Critical care was necessary to treat or prevent imminent or life-threatening deterioration due to anoxic brain injury. This patient is critically ill and at significant risk for neurological worsening and/or death and care requires constant monitoring. Critical care was time spent personally by me on the following activities: development of treatment plan with patient and/or surrogate as well as nursing, discussions with consultants, evaluation of patient's response to treatment, examination of patient, obtaining history from patient or surrogate, ordering and performing treatments and interventions, ordering and review of laboratory studies, ordering and review of radiographic studies, pulse oximetry, re-evaluation of patient's condition, participation in multidisciplinary rounds and medical decision making of high complexity in the care of this patient.

## 2020-04-23 NOTE — Progress Notes (Signed)
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 Recently admited for A/C heart failue in Feb and March of 2021 due to fluid and dietary indiscretion.   History of present illness    Not fully recovered physically after recent admission. Reported SOB. Hemoptysis yesterday x1 and x2-3 today. Was about to go see her orthopedic doctor when she slumped to chair at home, move to floor and 911 was called received bystander CPR for several minutes; pulse represent so this was stopped for about 1 minute during which EMS arrive and found her pulseless and apneic. ACLS for about 5 min. EPI x5, BICARB x1, Defib x1. King airway in the field, intubated in ED. Suspected to be respiratory driven with COPD and suspected OSA, worsening RHF.   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  Consults:  Cardiology, Nephrology, Neurology  Procedures:  4/20: Intubation  Significant Diagnostic Tests:   CT Chest (4/20) 1. Dense patchy opacity and consolidation in the posterior half of the left lung involving the left lower lobe and left upper lobe, compatible with pneumonia and atelectasis. 2. Milder patchy opacity in the right lung, primarily in the dependent right lower lobe, with underlying cylindrical bronchiectasis. This could be chronic or represent some superimposed infection. 3. Mild patchy ground-glass opacity in both lungs,  compatible with mild interstitial pulmonary edema/inflammation. 4. Small bilateral pleural effusions. 5. Small amount of free peritoneal fluid. 6. Multiple bilateral thyroid nodules, largest on the right measuring 2.5 cm in maximum diameter. Recommend thyroid US   CT Head (4/20) Negative for acute intracranial abnormalities   TTE (4/21)  1. Left ventricular ejection fraction, by estimation, is 55 to 60%. The  left ventricle has normal function. The left ventricle has no regional  wall motion abnormalities. There is mild concentric left ventricular  hypertrophy. Left ventricular diastolic  function could not be evaluated. There is the interventricular septum is  flattened in systole and diastole, consistent with right ventricular  pressure and volume overload.  2. RVSP underestimated in setting of severe TR. Right ventricular  systolic function is severely reduced. The right ventricular size is  severely enlarged. There is mildly elevated pulmonary artery systolic  pressure. The estimated right ventricular  systolic pressure is 94.4 mmHg.  3. Right atrial size was severely dilated.  4. Moderate pericardial effusion. The pericardial effusion is posterior  to the left ventricle.  5. The mitral valve is grossly normal. Trivial mitral valve  regurgitation. No evidence of mitral stenosis.  6. Tricuspid valve regurgitation is severe.  7. The aortic valve is tricuspid. Aortic valve regurgitation is not  visualized. Mild aortic valve sclerosis is present, with no evidence of  aortic valve stenosis.  8. The inferior vena cava is dilated in size with <50% respiratory  variability, suggesting right atrial pressure of 15 mmHg.   Micro Data:  4/20 Blood Cx: 1/4 GNR, species not on reflex 4/20 Urine Cx:  E. coli, 40,000, pan-sensitive  4/20 Resp Cx: Pending   Antimicrobials:  Vanc 4/20 - 4/21 Cefepime 4/20 - 4/24 Ceftriaxone 4/24 >>  Interim history/subjective:   Multiple runs  of prolonged VT with intermittent torsades as well. Interventions include increased Amio gtt, calcium gluconate, IV mag, and increased FiO2.   Objective   Blood pressure (!) 75/62, pulse 92, temperature 97.9 F (36.6 C), resp. rate (!) 28, height 5\' 5"  (1.651 m), weight 113.1 kg, SpO2 99 %. CVP:  [11 mmHg-13 mmHg] 12 mmHg  Vent Mode: PRVC FiO2 (%):  [40 %-60 %] 60 % Set Rate:  [28 bmp-30 bmp] 28 bmp Vt Set:  [450 mL] 450 mL PEEP:  [10 cmH20] 10 cmH20 Plateau Pressure:  [20 cmH20-24 cmH20] 22 cmH20   Intake/Output Summary (Last 24 hours) at 04/23/2020 0703 Last data filed at 04/23/2020 0700 Gross per 24 hour  Intake 5275.86 ml  Output 107 ml  Net 5168.86 ml   Filed Weights   04/21/20 1000 04/22/20 0500 04/23/20 0630  Weight: 107.7 kg 109 kg 113.1 kg   Physical Exam Vitals and nursing note reviewed.  Constitutional:      Appearance: She is ill-appearing and toxic-appearing.  HENT:     Head: Normocephalic and atraumatic.     Mouth/Throat:     Mouth: Mucous membranes are dry.  Eyes:     General: Scleral icterus present.  Cardiovascular:     Rate and Rhythm: Regular rhythm. Tachycardia present.     Heart sounds: No murmur.     Comments: Diffuse anasarca with 2+ pitting edema all over Pulmonary:     Breath sounds: No wheezing or rales.  Abdominal:     General: Bowel sounds are absent.     Palpations: There is no mass.     Comments: Abdomen less soft than previous exams   Musculoskeletal:     Right lower leg: Edema present.     Left lower leg: Edema present.  Skin:    Coloration: Skin is jaundiced.  Neurological:     Comments: No corneal, gag or cough reflex. Does not retract to noxious stimuli     Resolved Hospital Problem list    Assessment & Plan:   # Acute on Chronic Hypoxemic Respiratory Failure Suspect due to severe HF, COPD and untreated OSA. Mental status barring weaning/extubation Plan - Continue full vent support with PRVC @ 8 cc/kg  # V. Fib cardiac  arrest s/p TTM (downtime approx 5-4min)  # Paroxysmal atrial fibrillation with RVR # Acute on chronic combined heart failure with severe RV failure  Multiple runs of Vtach overnight with some torsades intermixed. Amiodarone increased to 60 mg/hr but decreased back to 30 mg/hr this afternoon after patient became brady.  Plan - Cardiology following; appreciate their recommendations  - Continue rate control with amiodarone - Continue pressor support with levo and vasopressin - CRRT as tolerated for volume removal   # Acute Encephalopathy Concern for global anoxic brain injury Neg CT head on admission. No seizure activity on EEG. Plan - Neurology following; appreciate their recommendations - Palliative following; appreciate their assistance in Edith Endave discussions  - Discontinuing sedation to allow for wake examination this AM.   # Shock, Septic and Cardiogenic  # Lactic Acidosis Positive blood and urine cultures with e.coli, pan-sensitive  White count and Lactate remains elevated Plan:  - Continue pressor support with levo and vasopressin - Continue Solucortef 50mg  q6h - Continue to Rocephin - Bicarb gtt @ 125 cc/hr  # Acute  on Chronic CKD III:  Suspect ATN. Remains anuric today. Creatinine stable.  Plan:  - Nephrology following, we appreciate their recommendations  - CRRT per Nephrology for volume removal  # Shock Liver LFTs severely elevated and continue to rise INR 5.9 this AM Plan:  - Continue to work on pressure support - Continue to follow labs  - Hold eliquis  # Acute on Chronic Thrombocytopenia (since 2009)  Continues to trend down, likely exacerbated by Heparin. No sign of active bleeding. Plan:  - Trend platelets - Monitor for signs of active bleeding  # Chronic Macrocytic Anemia  Stable and unchanged. B12 elevated Plan:  - Continue to monitor  # Hypoglycemia  # Hx of T2DM Plan:  - Titrate D10 as needed to maintain CBG > 70.   Best practice:  Diet: NPO  Pain/Anxiety/Delirium protocol (if indicated): Fentanyl gtt, Versed PRN VAP protocol (if indicated): Yes DVT prophylaxis: Holding heparin GI prophylaxis: PPI Glucose control: SSI Mobility: BR Code Status: Full Family Communication: Updated at bedside today.  Disposition: ICU  Labs   CBC: Recent Labs  Lab 04/20/20 0007 04/20/20 0458 04/20/20 1555 04/20/20 1555 04/21/20 0320 04/21/20 0516 04/21/20 2015 04/22/20 0408 04/22/20 0455 04/22/20 2040 04/23/20 0404  WBC 19.7*  --  21.4*  --  27.3*  --  33.5* 35.3*  --   --  37.7*  NEUTROABS 17.2*  --  19.7*  --  23.0*  --   --  30.8*  --   --  27.7*  HGB 12.2   < > 12.5   < > 12.8   < > 12.3 11.9* 15.0 15.0 11.8*  HCT 43.6   < > 45.2   < > 47.8*   < > 46.1* 44.1 44.0 44.0 43.5  MCV 109.8*  --  113.3*  --  114.9*  --  117.9* 116.4*  --   --  116.9*  PLT 76*  --  80*  --  77*  --  53* 47*  --   --  33*   < > = values in this interval not displayed.    Basic Metabolic Panel: Recent Labs  Lab 04/19/20 0313 04/19/20 0508 04/20/20 0442 04/20/20 0458 04/21/20 0320 04/21/20 0516 04/21/20 1510 04/21/20 1510 04/22/20 0408 04/22/20 0408 04/22/20 0455 04/22/20 1611 04/22/20 2040 04/22/20 2042 04/23/20 0404  NA 137   < > 137   < > 136   < > 138   < > 138   < > 138 139 135 139 136  K 4.1   < > 4.5   < > 5.0   < > 4.7   < > 4.8   < > 4.2 4.5 4.6 4.9 5.0  CL 100   < > 99   < > 98   < > 101  --  99  --   --  101  --  99 98  CO2 23   < > 19*   < > 14*   < > 16*  --  15*  --   --  17*  --  15* 17*  GLUCOSE 211*   < > 71   < > 68*   < > 151*  --  101*  --   --  118*  --  103* 101*  BUN 64*   < > 51*   < > 36*   < > 31*  --  25*  --   --  20  --  19 17  CREATININE 3.86*   < > 3.15*   < > 2.72*   < > 2.47*  --  2.33*  --   --  1.89*  --  2.03* 1.89*  CALCIUM 8.0*   < > 8.5*   < > 9.2   < > 8.6*  --  8.2*  --   --  8.1*  --  8.1* 8.2*  MG 2.1  --  2.3  --  2.5*  --   --   --  2.4  --   --   --   --   --  2.7*  PHOS 7.1*   < > 6.1*   < >  6.5*  --  5.9*  --  5.4*  --   --  4.8*  --   --  5.2*   < > = values in this interval not displayed.   GFR: Estimated Creatinine Clearance: 40.2 mL/min (A) (by C-G formula based on SCr of 1.89 mg/dL (H)). Recent Labs  Lab 04/19/20 2032 04/20/20 0007 04/20/20 0442 04/20/20 1109 04/20/20 1555 04/20/20 1950 04/20/20 2236 04/21/20 0320 04/21/20 2015 04/22/20 0408 04/22/20 1611 04/23/20 0404  PROCALCITON 14.96  --  13.42  --   --   --   --  9.03  --   --   --   --   WBC  --    < >  --    < >   < >  --   --  27.3* 33.5* 35.3*  --  37.7*  LATICACIDVEN 8.3*   < >  --    < >  --  >11.0* >11.0* >11.0*  --   --  >11.0*  --    < > = values in this interval not displayed.    Liver Function Tests: Recent Labs  Lab 04/20/20 0811 04/20/20 1535 04/21/20 0320 04/21/20 0320 04/21/20 1510 04/22/20 0408 04/22/20 1611 04/22/20 2042 04/23/20 0404  AST 49*  --  126*  --   --  2,164*  --  2,652* 2,832*  ALT 38  --  66*  --   --  637*  --  961* 1,117*  ALKPHOS 221*  --  216*  --   --  239*  --  265* 296*  BILITOT 10.2*  --  12.2*  --   --  13.5*  --  15.7* 15.8*  PROT 6.4*  --  6.5  --   --  5.3*  --  4.9* 4.7*  ALBUMIN 2.2*   < > 2.3*  2.2*   < > 2.0* 1.7*  1.7* 1.6* 1.6* 1.5*  1.6*   < > = values in this interval not displayed.   No results for input(s): LIPASE, AMYLASE in the last 168 hours. Recent Labs  Lab 04/21/20 0320  AMMONIA 43*    ABG    Component Value Date/Time   PHART 7.206 (L) 04/22/2020 2040   PCO2ART 36.3 04/22/2020 2040   PO2ART 69 (L) 04/22/2020 2040   HCO3 14.6 (L) 04/22/2020 2040   TCO2 16 (L) 04/22/2020 2040   ACIDBASEDEF 13.0 (H) 04/22/2020 2040   O2SAT 91.0 04/22/2020 2040     Coagulation Profile: Recent Labs  Lab 04/20/20 0726 04/21/20 0320 04/21/20 2015 04/22/20 0408 04/23/20 0404  INR 2.9* 5.5* 11.3* 8.0* 5.9*    Cardiac Enzymes: No results for input(s): CKTOTAL, CKMB, CKMBINDEX, TROPONINI in the last 168 hours.  HbA1C: Hgb A1c MFr  Bld  Date/Time Value Ref Range Status  02/15/2020  01:12 PM 7.5 (H) 4.8 - 5.6 % Final    Comment:    (NOTE) Pre diabetes:          5.7%-6.4% Diabetes:              >6.4% Glycemic control for   <7.0% adults with diabetes   07/04/2012 05:23 AM 6.4 (H) <5.7 % Final    Comment:    (NOTE)                                                                       According to the ADA Clinical Practice Recommendations for 2011, when HbA1c is used as a screening test:  >=6.5%   Diagnostic of Diabetes Mellitus           (if abnormal result is confirmed) 5.7-6.4%   Increased risk of developing Diabetes Mellitus References:Diagnosis and Classification of Diabetes Mellitus,Diabetes TNBZ,9672,89(TVNRW 1):S62-S69 and Standards of Medical Care in         Diabetes - 2011,Diabetes CHJS,4383,77 (Suppl 1):S11-S61.    CBG: Recent Labs  Lab 04/22/20 1139 04/22/20 1616 04/22/20 1956 04/22/20 2308 04/23/20 0309  GLUCAP 103* 116* 96 82 86    Dr. Jose Persia Internal Medicine PGY-1  Pager: [phone] 04/23/2020, 7:03 AM

## 2020-04-23 NOTE — Progress Notes (Addendum)
Palliative Medicine Inpatient Follow Up Note   HPI: 59 y.o. female  with past medical history of stroke, HFpEF, severe right heart failure, HTN, HLD, COPD 3L at home, paroxysmal atrial fibrillation on Eliquis, CKD 3, recurrent DVT, diabetes admitted on 04/26/2020 with cardiac arrest requiring intubation and CRRT and with liver failure, E. coli bacteremia. Ongoing treatment for septic and cardiogenic shock and s/p hypothermia protocol. Concern for poor neurological status.   Today's Discussion (04/23/2020): Chart reviewed. I talked to patients RN, Joellen Jersey. She shared that Ccm had communicated with the patients family earlier in the day. They emphasized that Lakera being the strong, independent woman she was prior to this would likely not want to live in a state as she is presently. Neurology had also come by and shared that [place] has suffered irreversible global anoxic injury.  I talked to [place] this afternoon. She said that this was all very difficult. I asked her what her thoughts are on cessation from CRRT and extubating her with placing a stronger emphasis on comfort oriented care. She said that she and her brother are going to talk about it this evening. She said that they had planned to talk earlier in the day but family from out of town had come to visit. I again shared with her what compassionate extubation looks like in the hospital setting. She does today seem more realistic and states that she knows she needs to make a decision within the next day. Offered support regarding the magnitude of such a decision. [name] was tearful over the phone and did share that she does not think that her mother would want to live like this. Discussed that truly the best thing we can do as loved ones is bring dignity and quality through decreasing the burden of suffering. Being attached to machines contributes greatly to this burden. We ended by agreeing to follow up with one another in the morning.    Discussed the importance of continued conversation with family and their  medical providers regarding overall plan of care and treatment options, ensuring decisions are within the context of the patients values and GOCs.  Questions and concerns addressed   Vital Signs Vitals:   04/23/20 1527 04/23/20 1530  BP: 98/77 104/75  Pulse: 94   Resp: (!) 36 (!) 23  Temp:  (!) 96.4 F (35.8 C)  SpO2:      Intake/Output Summary (Last 24 hours) at 04/23/2020 1548 Last data filed at 04/23/2020 1500 Gross per 24 hour  Intake 5279.89 ml  Output 104 ml  Net 5175.89 ml   Last Weight  Most recent update: 04/23/2020  6:41 AM   Weight  113.1 kg (249 lb 5.4 oz)           SUMMARY OF RECOMMENDATIONS   - Again, strongly recommended considering a comfort emphasis of care - Patient family plans to talk about compassionate extubation this evening, we plan to follow up in the morning - No changes in care plan today  Time Spent: 25 Greater than 50% of the time was spent in counseling and coordination of care ______________________________________________________________________________________ [facility] Team Team Cell Phone: [phone] Please utilize secure chat with additional questions, if there is no response within 30 minutes please call the above phone number  Palliative Medicine Team providers are available by phone from 7am to 7pm daily and can be reached through the team cell phone.  Should this patient require assistance outside of these hours, please call  the patient's attending physician.     

## 2020-04-23 NOTE — Progress Notes (Signed)
[facility] Progress Note Patient Name: Tracy [name] DOB: 04/26/1961 MRN: [id number]   Date of Service  04/23/2020  HPI/Events of Note  Fentanyl was completely stopped earlier today and now Pt is uncomfortable and dyssynchronous on the ventilator.  eICU Interventions  Low dose Fentanyl infusion re-started targeting RAS of 0 to -1, with absence of discomfort and ventilator dyssynchrony.        Kerry Kass Jatara Huettner 04/23/2020, 9:02 PM

## 2020-04-23 NOTE — Progress Notes (Signed)
EKG CRITICAL VALUE     12 lead EKG performed.  Critical value noted.  Joellen Jersey, RN notified.   Genia Plants, CCT 04/23/2020 12:49 PM

## 2020-04-23 NOTE — Procedures (Signed)
Admit: 04/25/2020 LOS: 5  26F s/p cardiac arrest, anoxic brain injury, chronic pulm HTN; shock, AoCKD4  Current CRRT Prescription: Start Date: 04/19/20 Catheter: IJ Temp HD cath BFR: 200 Pre Blood Pump: 400 4K DFR: 1000 4K Replacement Rate: 200 4K Goal UF: running net positive given hypotension Anticoagulation: none in circuit Clotting: infrequent  S: Max NE/VP Not able to pull fluid K 5.0 P 5.42 Remains full scope of care Very unfavorable neuro exam Palliative working with them Acidotic, rec IV HCO3 as well Inc LFTs,   O: 04/24 0701 - 04/25 0700 In: 5275.9 [I.V.:5061; IV Piggyback:214.9] Out: 107 [Urine:28]  Filed Weights   04/21/20 1000 04/22/20 0500 04/23/20 0630  Weight: 107.7 kg 109 kg 113.1 kg    Recent Labs  Lab 04/22/20 0408 04/22/20 0455 04/22/20 1611 04/22/20 1611 04/22/20 2040 04/22/20 2042 04/23/20 0404  NA 138   < > 139   < > 135 139 136  K 4.8   < > 4.5   < > 4.6 4.9 5.0  CL 99   < > 101  --   --  99 98  CO2 15*   < > 17*  --   --  15* 17*  GLUCOSE 101*   < > 118*  --   --  103* 101*  BUN 25*   < > 20  --   --  19 17  CREATININE 2.33*   < > 1.89*  --   --  2.03* 1.89*  CALCIUM 8.2*   < > 8.1*  --   --  8.1* 8.2*  PHOS 5.4*  --  4.8*  --   --   --  5.2*   < > = values in this interval not displayed.   Recent Labs  Lab 04/21/20 0320 04/21/20 0516 04/21/20 2015 04/21/20 2015 04/22/20 0408 04/22/20 0408 04/22/20 0455 04/22/20 2040 04/23/20 0404  WBC 27.3*   < > 33.5*  --  35.3*  --   --   --  37.7*  NEUTROABS 23.0*  --   --   --  30.8*  --   --   --  27.7*  HGB 12.8   < > 12.3   < > 11.9*   < > 15.0 15.0 11.8*  HCT 47.8*   < > 46.1*   < > 44.1   < > 44.0 44.0 43.5  MCV 114.9*   < > 117.9*  --  116.4*  --   --   --  116.9*  PLT 77*   < > 53*  --  47*  --   --   --  33*   < > = values in this interval not displayed.    Scheduled Meds: . budesonide (PULMICORT) nebulizer solution  0.5 mg Nebulization BID  . chlorhexidine gluconate  (MEDLINE KIT)  15 mL Mouth Rinse BID  . Chlorhexidine Gluconate Cloth  6 each Topical Daily  . hydrocortisone sod succinate (SOLU-CORTEF) inj  50 mg Intravenous Q6H  . ipratropium-albuterol  3 mL Nebulization Q6H  . mouth rinse  15 mL Mouth Rinse 10 times per day  . pantoprazole (PROTONIX) IV  40 mg Intravenous QHS   Continuous Infusions: .  prismasol BGK 4/2.5 500 mL/hr at 04/23/20 0228  .  prismasol BGK 4/2.5 200 mL/hr at 04/21/20 1005  . sodium chloride Stopped (04/23/20 0159)  . amiodarone 60 mg/hr (04/23/20 0900)  . cefTRIAXone (ROCEPHIN)  IV Stopped (04/22/20 1659)  . dextrose Stopped (04/21/20 1603)  .  feeding supplement (VITAL HIGH PROTEIN) Stopped (04/21/20 0759)  . norepinephrine (LEVOPHED) Adult infusion 40 mcg/min (04/23/20 0900)  . prismasol BGK 4/2.5 1,000 mL/hr at 04/23/20 3428  . sodium bicarbonate 150 mEq in dextrose 5% 1000 mL 125 mL/hr at 04/23/20 0900  . vasopressin (PITRESSIN) infusion - *FOR SHOCK* 0.03 Units/min (04/23/20 0900)   PRN Meds:.artificial tears, docusate sodium, heparin, heparin, polyethylene glycol  ABG    Component Value Date/Time   PHART 7.206 (L) 04/22/2020 2040   PCO2ART 36.3 04/22/2020 2040   PO2ART 69 (L) 04/22/2020 2040   HCO3 14.6 (L) 04/22/2020 2040   TCO2 16 (L) 04/22/2020 2040   ACIDBASEDEF 13.0 (H) 04/22/2020 2040   O2SAT 91.0 04/22/2020 2040    A/P  1. Dialysis dependent AoCKD4, anuric; on CRRT all 4K fluids, not able to tolerate UF 2/2 hypotension 2. Shock on NE/VP, per CCM; mixed 3. Cardiac arrest s/p TTM 4. Anoxic brain injury 5. Shock liver, inc INR 6. pulm HTN/ R sided HF 7. Persistent metabolic acidosis on CRRT and NaHCO3 gtt 8. Bacteremia  Cont supportive care, CRRT at current settings.  Exceedingly poor outlook, discussed with family.  They are processing.   Pearson Grippe, MD [facility]

## 2020-04-23 NOTE — Plan of Care (Signed)

## 2020-04-24 ENCOUNTER — Ambulatory Visit (HOSPITAL_COMMUNITY): Payer: Medicare Other | Admitting: Physical Therapy

## 2020-04-24 DIAGNOSIS — D689 Coagulation defect, unspecified: Secondary | ICD-10-CM

## 2020-04-24 DIAGNOSIS — K7201 Acute and subacute hepatic failure with coma: Secondary | ICD-10-CM

## 2020-04-24 DIAGNOSIS — D696 Thrombocytopenia, unspecified: Secondary | ICD-10-CM

## 2020-04-24 DIAGNOSIS — R7989 Other specified abnormal findings of blood chemistry: Secondary | ICD-10-CM

## 2020-04-24 LAB — POCT I-STAT 7, (LYTES, BLD GAS, ICA,H+H)
Acid-base deficit: 6 mmol/L — ABNORMAL HIGH (ref 0.0–2.0)
Bicarbonate: 20.9 mmol/L (ref 20.0–28.0)
Calcium, Ion: 1.06 mmol/L — ABNORMAL LOW (ref 1.15–1.40)
HCT: 41 % (ref 36.0–46.0)
Hemoglobin: 13.9 g/dL (ref 12.0–15.0)
O2 Saturation: 100 %
Patient temperature: 36.2
Potassium: 4.5 mmol/L (ref 3.5–5.1)
Sodium: 133 mmol/L — ABNORMAL LOW (ref 135–145)
TCO2: 22 mmol/L (ref 22–32)
pCO2 arterial: 43.5 mmHg (ref 32.0–48.0)
pH, Arterial: 7.285 — ABNORMAL LOW (ref 7.350–7.450)
pO2, Arterial: 223 mmHg — ABNORMAL HIGH (ref 83.0–108.0)

## 2020-04-24 LAB — RENAL FUNCTION PANEL
Albumin: 1.4 g/dL — ABNORMAL LOW (ref 3.5–5.0)
Anion gap: 19 — ABNORMAL HIGH (ref 5–15)
BUN: 13 mg/dL (ref 6–20)
CO2: 19 mmol/L — ABNORMAL LOW (ref 22–32)
Calcium: 8 mg/dL — ABNORMAL LOW (ref 8.9–10.3)
Chloride: 99 mmol/L (ref 98–111)
Creatinine, Ser: 1.68 mg/dL — ABNORMAL HIGH (ref 0.44–1.00)
GFR calc Af Amer: 38 mL/min — ABNORMAL LOW (ref >60)
GFR calc non Af Amer: 33 mL/min — ABNORMAL LOW (ref >60)
Glucose, Bld: 246 mg/dL — ABNORMAL HIGH (ref 70–99)
Phosphorus: 4.2 mg/dL (ref 2.5–4.6)
Potassium: 5.1 mmol/L (ref 3.5–5.1)
Sodium: 137 mmol/L (ref 135–145)

## 2020-04-24 LAB — CBC WITH DIFFERENTIAL/PLATELET
Abs Immature Granulocytes: 2.47 10*3/uL — ABNORMAL HIGH (ref 0.00–0.07)
Basophils Absolute: 0.3 10*3/uL — ABNORMAL HIGH (ref 0.0–0.1)
Basophils Relative: 1 %
Eosinophils Absolute: 0.2 10*3/uL (ref 0.0–0.5)
Eosinophils Relative: 1 %
HCT: 41.6 % (ref 36.0–46.0)
Hemoglobin: 11.3 g/dL — ABNORMAL LOW (ref 12.0–15.0)
Immature Granulocytes: 7 %
Lymphocytes Relative: 3 %
Lymphs Abs: 1.1 10*3/uL (ref 0.7–4.0)
MCH: 31 pg (ref 26.0–34.0)
MCHC: 27.2 g/dL — ABNORMAL LOW (ref 30.0–36.0)
MCV: 114.3 fL — ABNORMAL HIGH (ref 80.0–100.0)
Monocytes Absolute: 0.8 10*3/uL (ref 0.1–1.0)
Monocytes Relative: 2 %
Neutro Abs: 30.9 10*3/uL — ABNORMAL HIGH (ref 1.7–7.7)
Neutrophils Relative %: 86 %
Platelets: 25 10*3/uL — CL (ref 150–400)
RBC: 3.64 MIL/uL — ABNORMAL LOW (ref 3.87–5.11)
RDW: 31.1 % — ABNORMAL HIGH (ref 11.5–15.5)
WBC: 35.8 10*3/uL — ABNORMAL HIGH (ref 4.0–10.5)
nRBC: 32.2 % — ABNORMAL HIGH (ref 0.0–0.2)

## 2020-04-24 LAB — HEPATIC FUNCTION PANEL
ALT: 1141 U/L — ABNORMAL HIGH (ref 0–44)
AST: 2485 U/L — ABNORMAL HIGH (ref 15–41)
Albumin: 1.4 g/dL — ABNORMAL LOW (ref 3.5–5.0)
Alkaline Phosphatase: 351 U/L — ABNORMAL HIGH (ref 38–126)
Bilirubin, Direct: 9.8 mg/dL — ABNORMAL HIGH (ref 0.0–0.2)
Indirect Bilirubin: 7.5 mg/dL — ABNORMAL HIGH (ref 0.3–0.9)
Total Bilirubin: 17.3 mg/dL — ABNORMAL HIGH (ref 0.3–1.2)
Total Protein: 4.4 g/dL — ABNORMAL LOW (ref 6.5–8.1)

## 2020-04-24 LAB — PROTIME-INR
INR: 4.3 (ref 0.8–1.2)
Prothrombin Time: 41.3 seconds — ABNORMAL HIGH (ref 11.4–15.2)

## 2020-04-24 LAB — CALCIUM, IONIZED: Calcium, Ionized, Serum: 4 mg/dL — ABNORMAL LOW (ref 4.5–5.6)

## 2020-04-24 LAB — GLUCOSE, CAPILLARY
Glucose-Capillary: 108 mg/dL — ABNORMAL HIGH (ref 70–99)
Glucose-Capillary: 154 mg/dL — ABNORMAL HIGH (ref 70–99)
Glucose-Capillary: 165 mg/dL — ABNORMAL HIGH (ref 70–99)
Glucose-Capillary: 193 mg/dL — ABNORMAL HIGH (ref 70–99)
Glucose-Capillary: 203 mg/dL — ABNORMAL HIGH (ref 70–99)
Glucose-Capillary: 205 mg/dL — ABNORMAL HIGH (ref 70–99)

## 2020-04-24 LAB — MAGNESIUM: Magnesium: 2.2 mg/dL (ref 1.7–2.4)

## 2020-04-24 LAB — LACTIC ACID, PLASMA: Lactic Acid, Venous: >11 mmol/L (ref 0.5–1.9)

## 2020-04-24 MED ORDER — GLYCOPYRROLATE 1 MG PO TABS
1.0000 mg | ORAL_TABLET | ORAL | Status: DC | PRN
  Filled 2020-04-24: qty 1

## 2020-04-24 MED ORDER — ACETAMINOPHEN 325 MG PO TABS: 650.0000 mg | ORAL_TABLET | Freq: Four times a day (QID) | ORAL | Status: DC | PRN

## 2020-04-24 MED ORDER — MIDAZOLAM HCL 2 MG/2ML IJ SOLN: 2.0000 mg | INTRAMUSCULAR | Status: DC | PRN

## 2020-04-24 MED ORDER — DIPHENHYDRAMINE HCL 50 MG/ML IJ SOLN: 25.0000 mg | INTRAMUSCULAR | Status: DC | PRN

## 2020-04-24 MED ORDER — GLYCOPYRROLATE 0.2 MG/ML IJ SOLN: 0.2000 mg | INTRAMUSCULAR | Status: DC | PRN

## 2020-04-24 MED ORDER — SODIUM CHLORIDE 0.9% FLUSH: 10.0000 mL | Freq: Two times a day (BID) | INTRAVENOUS | Status: DC

## 2020-04-24 MED ORDER — MIDAZOLAM HCL 2 MG/2ML IJ SOLN
INTRAMUSCULAR | Status: AC
  Administered 2020-04-24: 2 mg via INTRAVENOUS
  Filled 2020-04-24: qty 2

## 2020-04-24 MED ORDER — ACETAMINOPHEN 650 MG RE SUPP: 650.0000 mg | Freq: Four times a day (QID) | RECTAL | Status: DC | PRN

## 2020-04-24 MED ORDER — POLYVINYL ALCOHOL 1.4 % OP SOLN: 1.0000 [drp] | Freq: Four times a day (QID) | OPHTHALMIC | Status: DC | PRN

## 2020-04-24 MED ORDER — SODIUM CHLORIDE 0.9% FLUSH: 10.0000 mL | INTRAVENOUS | Status: DC | PRN

## 2020-04-26 ENCOUNTER — Encounter (HOSPITAL_COMMUNITY): Payer: Self-pay | Admitting: Physical Therapy

## 2020-04-26 ENCOUNTER — Ambulatory Visit (HOSPITAL_COMMUNITY): Payer: Medicare Other | Admitting: Physical Therapy

## 2020-04-26 IMAGING — CT CT CHEST W/O CM
2 of 4 series · 13 of 36 positions shown, 16 images · non-contrast
Comparison: 03/21/2015. Portable chest obtained earlier today.

CLINICAL DATA: Hemoptysis.

EXAM:
CT CHEST WITHOUT CONTRAST
TECHNIQUE: Multidetector CT imaging of the chest was performed following the
standard protocol without IV contrast.

[Series 4: chest wo · axial · 0.82mm/px · z∈[-245,-9]mm · 10 of 140 slices shown, 13 images]
[im 11/140  mediastinal]
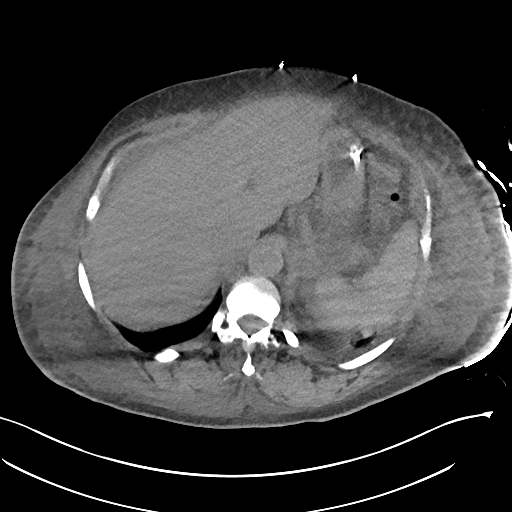
[im 11/140  lung]
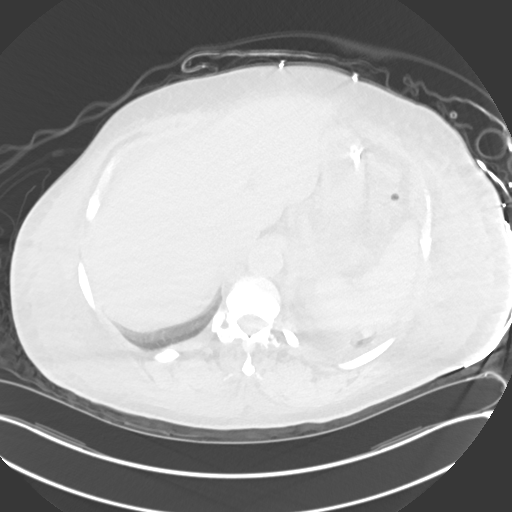
[im 22/140  lung]
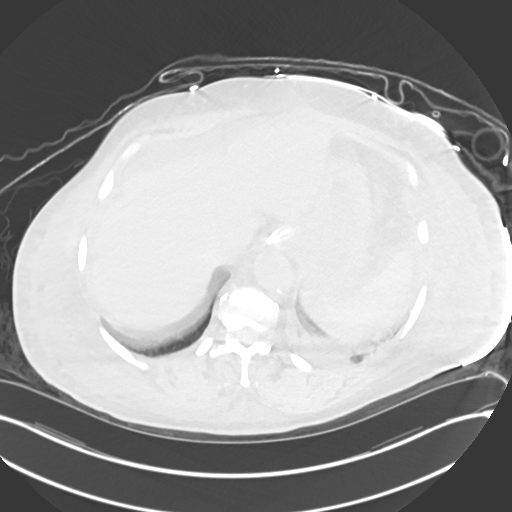
[im 43/140  lung]
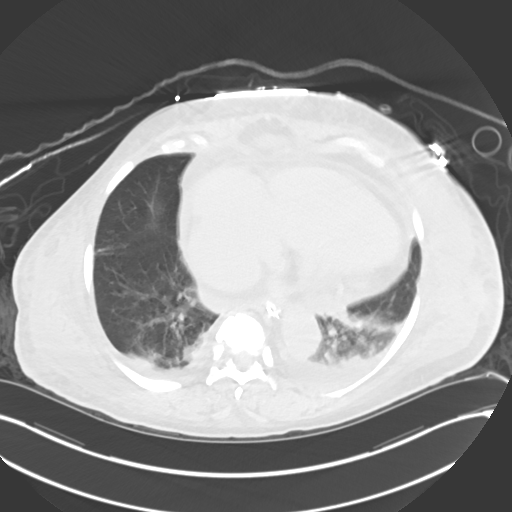
[im 54/140  lung]
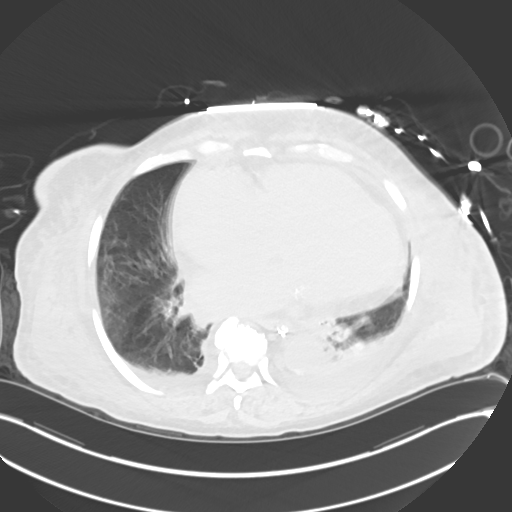
[im 65/140  mediastinal]
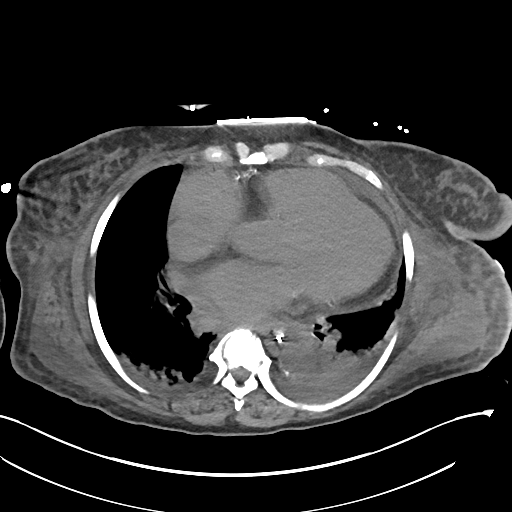
[im 65/140  lung]
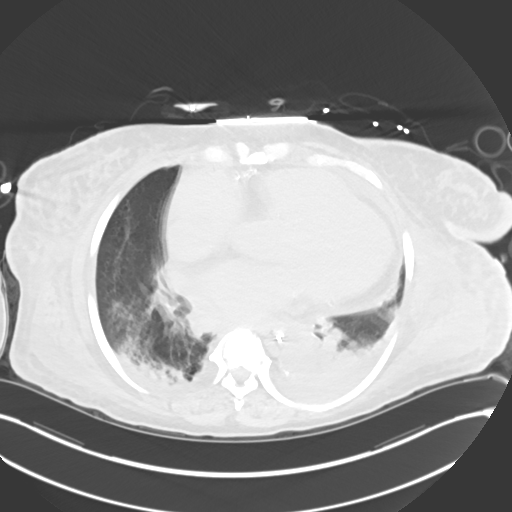
[im 75/140  lung]
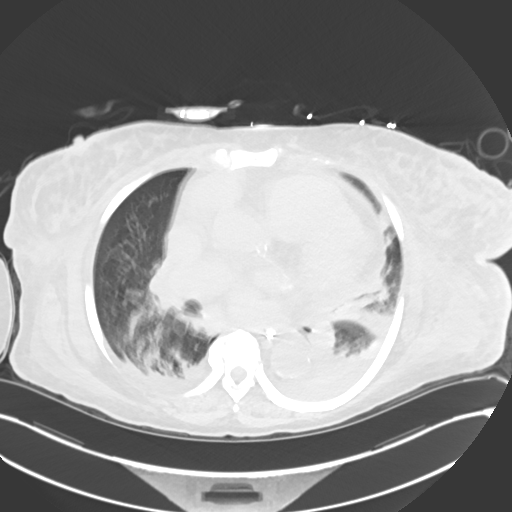
[im 86/140  lung]
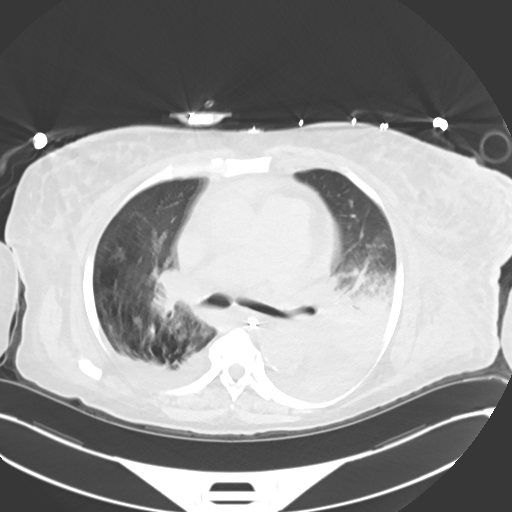
[im 107/140  lung]
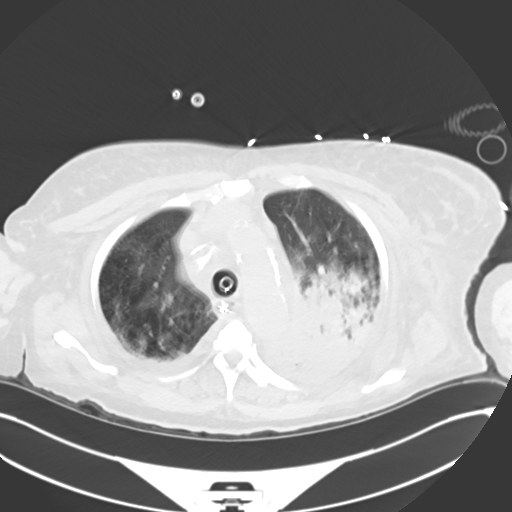
[im 118/140  mediastinal]
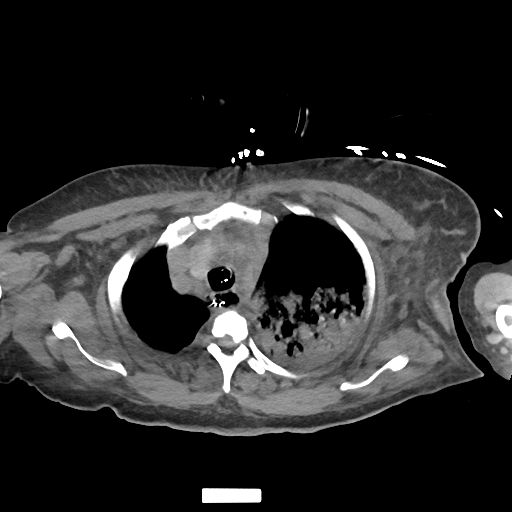
[im 118/140  lung]
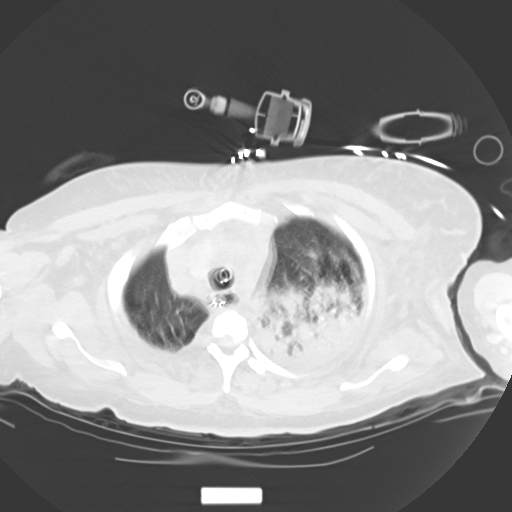
[im 129/140  lung]
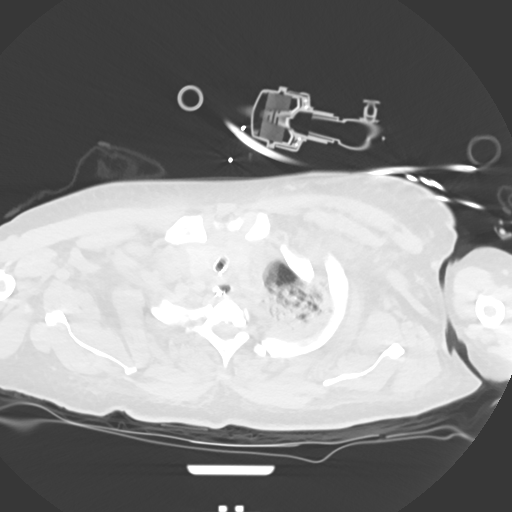

[Series 7: cor · coronal · 0.58mm/px · 3 of 146 slices shown]
[im 30/146  lung]
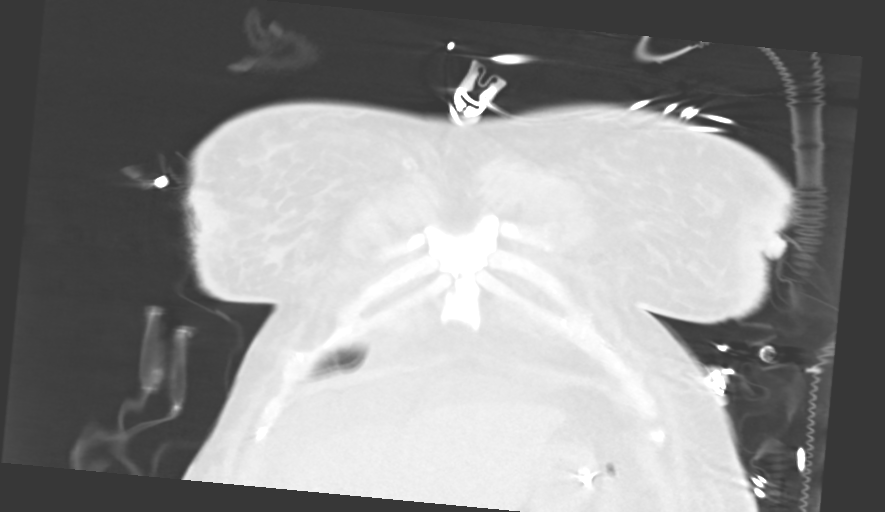
[im 59/146  lung]
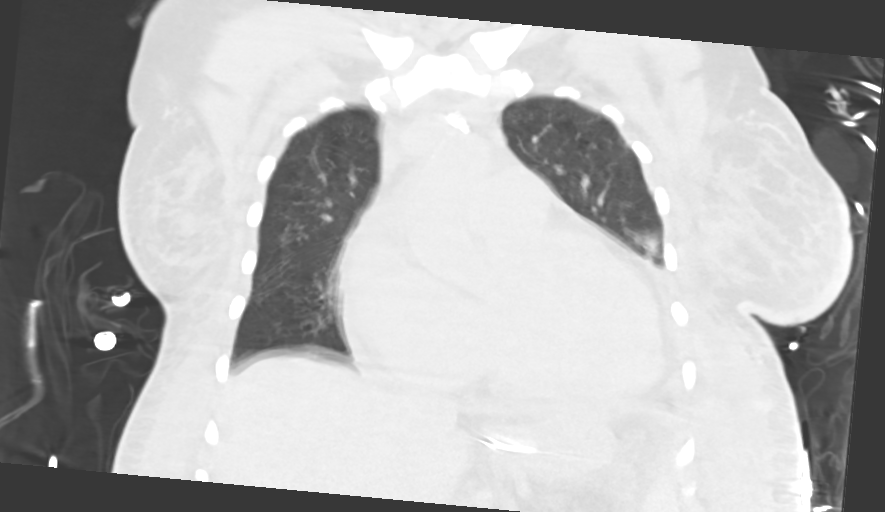
[im 88/146  lung]
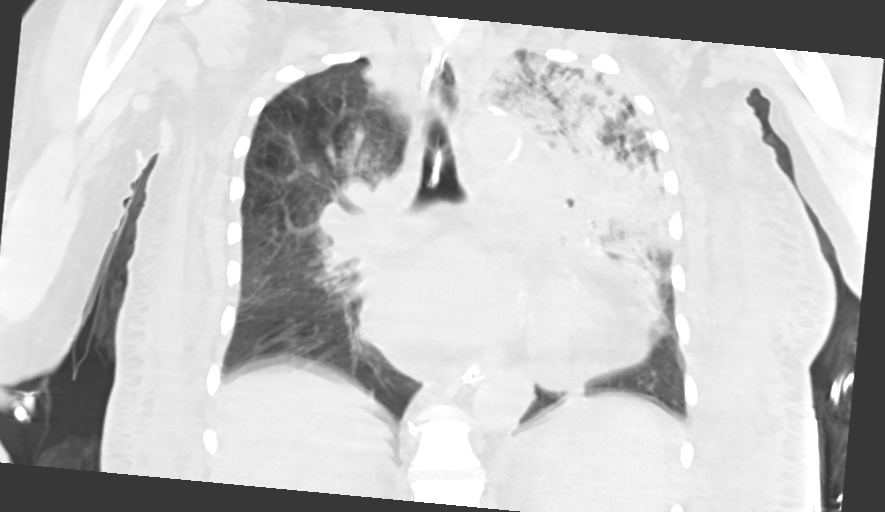

[13 of 36 positions shown; findings below may reference images not displayed]

FINDINGS: Cardiovascular: Stable enlarged heart. Small pericardial effusion
with a maximum thickness of 1.2 cm, not previously present.
Atheromatous calcifications, including the coronary arteries and
aorta.

Mediastinum/Nodes: Endotracheal tube tip at the top of the carina.
Feeding tube extending into the stomach.

Multiple bilateral thyroid nodules. The largest is on the right
measuring 2.5 cm in maximum diameter.

Enlarged precarinal node with a short axis diameter of 21 mm,
previously 19 mm. No other enlarged lymph nodes are seen.

Lungs/Pleura: Small bilateral pleural effusions. Dense patchy
opacity and consolidation in the posterior half of the left lung
involving the left lower lobe and left upper lobe. Milder patchy
opacity in the right lung, primarily in the dependent right lower
lobe, with underlying cylindrical bronchiectasis. Mild patchy
ground-glass opacity in both lungs.

Upper Abdomen: Small amount of free peritoneal fluid.

Musculoskeletal: Thoracic spine degenerative changes.
IMPRESSION: 1. Dense patchy opacity and consolidation in the posterior half of
the left lung involving the left lower lobe and left upper lobe,
compatible with pneumonia and atelectasis.
2. Milder patchy opacity in the right lung, primarily in the
dependent right lower lobe, with underlying cylindrical
bronchiectasis. This could be chronic or represent some superimposed
infection.
3. Mild patchy ground-glass opacity in both lungs, compatible with
mild interstitial pulmonary edema/inflammation.
4. Small bilateral pleural effusions.
5. Small amount of free peritoneal fluid.
6. Multiple bilateral thyroid nodules, largest on the right
measuring 2.5 cm in maximum diameter. Recommend thyroid US (ref: [HOSPITAL]. [DATE]): 143-50).
7. Calcific coronary artery and aortic atherosclerosis.

Aortic Atherosclerosis ([id number]-[id number].A).

## 2020-04-26 NOTE — Therapy (Signed)
[facility] [address] [place], [place], [address] Phone: [phone]   Fax:  [phone]  Patient Details  Name: Tracy [name] MRN: [id number] Date of Birth: 05/09/1961 Referring Provider:  No ref. provider found  Encounter Date: 04/26/2020   PHYSICAL THERAPY DISCHARGE SUMMARY  Visits from Start of Care: 4 Current functional level related to goals / functional outcomes: Pt expired    Remaining deficits: See above    Education / Equipment: none Plan: Patient agrees to discharge.  Patient goals were not met. Patient is being discharged due to a change in medical status.  ?????     Cynthia Russell, PT CLT [phone] 04/26/2020, 8:46 AM  [facility] [address] [place], [place], [address] Phone: [phone]   Fax:  [phone] 

## 2020-04-27 DIAGNOSIS — J9611 Chronic respiratory failure with hypoxia: Secondary | ICD-10-CM | POA: Diagnosis not present

## 2020-04-27 DIAGNOSIS — I509 Heart failure, unspecified: Secondary | ICD-10-CM | POA: Diagnosis not present

## 2020-04-27 DIAGNOSIS — J449 Chronic obstructive pulmonary disease, unspecified: Secondary | ICD-10-CM | POA: Diagnosis not present

## 2020-04-27 DIAGNOSIS — N184 Chronic kidney disease, stage 4 (severe): Secondary | ICD-10-CM | POA: Diagnosis not present

## 2020-04-27 LAB — CULTURE, BLOOD (ROUTINE X 2)

## 2020-04-28 ENCOUNTER — Encounter (HOSPITAL_COMMUNITY): Payer: Medicare Other

## 2020-04-28 DIAGNOSIS — N184 Chronic kidney disease, stage 4 (severe): Secondary | ICD-10-CM | POA: Diagnosis not present

## 2020-04-28 DIAGNOSIS — J449 Chronic obstructive pulmonary disease, unspecified: Secondary | ICD-10-CM | POA: Diagnosis not present

## 2020-04-29 NOTE — Progress Notes (Signed)
Admit: 04/16/2020 LOS: 6  37F s/p cardiac arrest, anoxic brain injury, chronic pulm HTN; shock, AoCKD4   S: Patient with low platelets this AM.  Per nursing hasn't been getting heparin. Family and team are discussing goals of care again this morning per nursing.  She has been back on fentanly.  On levo at 40 mcg/min and on vaso; also on bicarb gtt.  Haven't been able to pull fluid due to hypotension - has been positive.    O: 04/25 0701 - 04/26 0700 In: 5443.9 [I.V.:5343.8; IV Piggyback:100.1] Out: 48 [Urine:10]  Filed Weights   04/21/20 1000 04/22/20 0500 04/23/20 0630  Weight: 107.7 kg 109 kg 113.1 kg    Recent Labs  Lab 04/22/20 1611 04/22/20 2040 04/22/20 2042 04/22/20 2042 04/23/20 0404 04/23/20 1800 2020-05-11 0315  NA 139   < > 139   < > 136 135 133*  K 4.5   < > 4.9   < > 5.0 4.9 4.5  CL 101  --  99  --  98 97*  --   CO2 17*  --  15*  --  17* 17*  --   GLUCOSE 118*  --  103*  --  101* 227*  --   BUN 20  --  19  --  17 14  --   CREATININE 1.89*  --  2.03*  --  1.89* 1.67*  --   CALCIUM 8.1*  --  8.1*  --  8.2* 7.9*  --   PHOS 4.8*  --   --   --  5.2* 4.3  --    < > = values in this interval not displayed.   Recent Labs  Lab 04/22/20 0408 04/22/20 0455 04/23/20 0404 05/11/20 0315 05-11-20 0408  WBC 35.3*  --  37.7*  --  35.8*  NEUTROABS 30.8*  --  27.7*  --  30.9*  HGB 11.9*   < > 11.8* 13.9 11.3*  HCT 44.1   < > 43.5 41.0 41.6  MCV 116.4*  --  116.9*  --  114.3*  PLT 47*  --  33*  --  25*   < > = values in this interval not displayed.    Scheduled Meds: . budesonide (PULMICORT) nebulizer solution  0.5 mg Nebulization BID  . chlorhexidine gluconate (MEDLINE KIT)  15 mL Mouth Rinse BID  . Chlorhexidine Gluconate Cloth  6 each Topical Daily  . hydrocortisone sod succinate (SOLU-CORTEF) inj  50 mg Intravenous Q6H  . ipratropium-albuterol  3 mL Nebulization Q6H  . mouth rinse  15 mL Mouth Rinse 10 times per day  . pantoprazole (PROTONIX) IV  40 mg  Intravenous QHS  . sodium chloride flush  10-40 mL Intracatheter Q12H   Continuous Infusions: .  prismasol BGK 4/2.5 500 mL/hr at 04/23/20 2217  .  prismasol BGK 4/2.5 200 mL/hr at 04/23/20 1200  . sodium chloride Stopped (05/11/2020 0402)  . amiodarone Stopped (04/23/20 1805)  . cefTRIAXone (ROCEPHIN)  IV Stopped (04/23/20 1639)  . dextrose Stopped (05-11-20 0402)  . feeding supplement (VITAL HIGH PROTEIN) Stopped (04/21/20 0759)  . fentaNYL infusion INTRAVENOUS 75 mcg/hr (2020/05/11 0500)  . norepinephrine (LEVOPHED) Adult infusion 40 mcg/min (05-11-20 0500)  . prismasol BGK 4/2.5 1,000 mL/hr at 2020/05/11 0202  . sodium bicarbonate 150 mEq in dextrose 5% 1000 mL 125 mL/hr at 05/11/20 0500  . vasopressin (PITRESSIN) infusion - *FOR SHOCK* 0.03 Units/min (May 11, 2020 0500)   PRN Meds:.artificial tears, docusate sodium, polyethylene glycol, sodium chloride flush  General adult female in bed critically ill  HEENT normocephalic atraumatic eyes open does not track Lungs coarse mechanical breath sounds Heart tachycardic no rub on pressors Abdomen soft nondistended Extremities 2+ edema  Neuro - sedation currently running. Doesn't follow commands Access right IJ nontunneled catheter    ABG    Component Value Date/Time   PHART 7.285 (L) 04/26/20 0315   PCO2ART 43.5 2020/04/26 0315   PO2ART 223 (H) Apr 26, 2020 0315   HCO3 20.9 26-Apr-2020 0315   TCO2 22 04/26/2020 0315   ACIDBASEDEF 6.0 (H) 04/26/20 0315   O2SAT 100.0 2020-04-26 0315    A/P  1. Dialysis dependent AoCKD4, anuric; on CRRT.  CRRT started 4/21.  all 4K fluids, not able to tolerate UF 2/2 hypotension.  Inc BF to 250 and inc dialysate to 1.8 liters 2. Multifactorial shock on pressors per CCM 3. Cardiac arrest s/p TTM 4. Anoxic brain injury - poor prognosis, neurology and palliative care following 5. Shock liver - per primary team  6. pulm HTN/ R sided HF - unable to pull fluid with CRRT due to pressure 7. Persistent  metabolic acidosis - on CRRT and bicarb 8. Bacteremia - Abx per primary team  9. Thrombocytopenia - no heparin with CRRT.  PRN orders were removed this AM as well  Claudia Desanctis 5:45 AM Apr 26, 2020

## 2020-04-29 NOTE — Progress Notes (Signed)
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 Recently admited for A/C heart failue in Feb and March of 2021 due to fluid and dietary indiscretion.   History of present illness    Not fully recovered physically after recent admission. Reported SOB. Hemoptysis yesterday x1 and x2-3 today. Was about to go see her orthopedic doctor when she slumped to chair at home, move to floor and 911 was called received bystander CPR for several minutes; pulse represent so this was stopped for about 1 minute during which EMS arrive and found her pulseless and apneic. ACLS for about 5 min. EPI x5, BICARB x1, Defib x1. King airway in the field, intubated in ED. Suspected to be respiratory driven with COPD and suspected OSA, worsening RHF.   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  Consults:  Cardiology, Nephrology, Neurology  Procedures:  4/20: Intubation  Significant Diagnostic Tests:   CT Chest (4/20) 1. Dense patchy opacity and consolidation in the posterior half of the left lung involving the left lower lobe and left upper lobe, compatible with pneumonia and atelectasis. 2. Milder patchy opacity in the right lung, primarily in the dependent right lower lobe, with underlying cylindrical bronchiectasis. This could be chronic or represent some superimposed infection. 3. Mild patchy ground-glass opacity in both lungs,  compatible with mild interstitial pulmonary edema/inflammation. 4. Small bilateral pleural effusions. 5. Small amount of free peritoneal fluid. 6. Multiple bilateral thyroid nodules, largest on the right measuring 2.5 cm in maximum diameter. Recommend thyroid US   CT Head (4/20) Negative for acute intracranial abnormalities   TTE (4/21)  1. Left ventricular ejection fraction, by estimation, is 55 to 60%. The  left ventricle has normal function. The left ventricle has no regional  wall motion abnormalities. There is mild concentric left ventricular  hypertrophy. Left ventricular diastolic  function could not be evaluated. There is the interventricular septum is  flattened in systole and diastole, consistent with right ventricular  pressure and volume overload.  2. RVSP underestimated in setting of severe TR. Right ventricular  systolic function is severely reduced. The right ventricular size is  severely enlarged. There is mildly elevated pulmonary artery systolic  pressure. The estimated right ventricular  systolic pressure is 28.4 mmHg.  3. Right atrial size was severely dilated.  4. Moderate pericardial effusion. The pericardial effusion is posterior  to the left ventricle.  5. The mitral valve is grossly normal. Trivial mitral valve  regurgitation. No evidence of mitral stenosis.  6. Tricuspid valve regurgitation is severe.  7. The aortic valve is tricuspid. Aortic valve regurgitation is not  visualized. Mild aortic valve sclerosis is present, with no evidence of  aortic valve stenosis.  8. The inferior vena cava is dilated in size with <50% respiratory  variability, suggesting right atrial pressure of 15 mmHg.   Micro Data:  4/20 Blood Cx: 1/4 GNR, species not on reflex 4/20 Urine Cx:  E. coli, 40,000, pan-sensitive  4/20 Resp Cx: Pending   Antimicrobials:  Vanc 4/20 - 4/21 Cefepime 4/20 - 4/24 Ceftriaxone 4/24 >>  Interim history/subjective:   Vent  dyssynchrony noted overnight, so Fentanyl drip was restarted. Additionally, Ms. Rabadan was noted to be hyperglycemic, so D10 gtt was stopped. No other overnight events.   Objective   Blood pressure (!) 81/51, pulse (!) 245, temperature 97.9 F (36.6 C), resp. rate 18, height 5\' 5"  (1.651 m), weight 115.2 kg, SpO2 (!) 81 %. CVP:  [2 mmHg-18 mmHg] 18 mmHg  Vent Mode: PRVC FiO2 (%):  [60 %-100 %] 60 % Set Rate:  [28 bmp] 28 bmp Vt Set:  [450 mL] 450 mL PEEP:  [10 cmH20] 10 cmH20 Plateau Pressure:  [18 cmH20-23 cmH20] 23 cmH20   Intake/Output Summary (Last 24 hours) at 09-May-2020 0721 Last data filed at 2020/05/09 0700 Gross per 24 hour  Intake 5802.6 ml  Output 52 ml  Net 5750.6 ml   Filed Weights   04/22/20 0500 04/23/20 0630 May 09, 2020 0500  Weight: 109 kg 113.1 kg 115.2 kg   Physical Exam Vitals and nursing note reviewed.  Constitutional:      Appearance: She is ill-appearing and toxic-appearing.  HENT:     Head: Normocephalic and atraumatic.     Mouth/Throat:     Mouth: Mucous membranes are dry.  Eyes:     General: Scleral icterus present.  Cardiovascular:     Rate and Rhythm: Regular rhythm. Tachycardia present.     Heart sounds: No murmur.     Comments: Diffuse anasarca with 2+ pitting edema all over Pulmonary:     Breath sounds: No wheezing or rales.  Abdominal:     General: Bowel sounds are absent. There is distension.     Palpations: Abdomen is rigid. There is no mass.  Musculoskeletal:     Right lower leg: 3+ Pitting Edema present.     Left lower leg: 3+ Pitting Edema present.  Skin:    Coloration: Skin is jaundiced.  Neurological:     Comments: No corneal, gag or cough reflex. Does not retract to noxious stimuli     Resolved Hospital Problem list    Assessment & Plan:   # Shock, Septic and Cardiogenic  # Lactic Acidosis Positive blood cultures are positive for beta-lactamase positive Bacteroides in 1/4 bottles. Ceftriaxone has not been covering this.  Will need to add Flagyl to cover this but will first discuss escalation of care with family, as we may transition to Comfort care this AM.  Urine cultures positive with e.coli, pan-sensitive  Lactic acid continues to be elevated above 11, very poor prognosis Plan:  - Continue pressor support with levo and vasopressin - Continue Solucortef 50mg  q6h - Continue Rocephin - Bicarb gtt @ 125 cc/hr  # Acute Encephalopathy There is concern for global anoxic brain injury Plan - Neurology following; appreciate their recommendations - Palliative following; appreciate their assistance in [place] discussions  - Daily wake examinations off sedation   # Acute on Chronic Hypoxemic Respiratory Failure Suspect due to severe HF, COPD and untreated OSA. Mental status barring weaning/extubation Plan - Continue full vent support - VAP prevention protocol in place  # V. Fib cardiac arrest s/p TTM (downtime approx 5-62min)  # Paroxysmal atrial fibrillation with RVR # Acute on chronic combined heart failure with severe RV failure  Plan - Cardiology following; appreciate their recommendations  - Continue rate control with amiodarone - Continue pressor support with levo and vasopressin -  CRRT as tolerated for volume removal   # Acute on Chronic CKD III:  Secondary to ATN. Creatinine as been stable but no return of urine production. Difficulty removing fluid with CRRT due to worsening hypotension Plan:  - Nephrology following, we appreciate their recommendations  - CRRT per Nephrology  # Shock Liver LFTs are severely elevated but stable. INR has slowly been coming down.  Plan:  - Continue to work on pressure support - Trend INR  - Hold Heparin, Elliquis   # Acute on Chronic Thrombocytopenia (since 2009)  Continues to trend down, as low as 25 today. No sign of active bleeding. Plan:  - Trend platelets - Monitor for signs of active bleeding - Discontinue Heparin   # Chronic Macrocytic Anemia    Stable and unchanged. B12 elevated Plan:  - Continue to monitor  # Hypoglycemia  # Hx of T2DM Plan:  - Stop D10 as hyperglycemic now  Best practice:  Diet: NPO Pain/Anxiety/Delirium protocol (if indicated): Fentanyl gtt, Versed PRN VAP protocol (if indicated): Yes DVT prophylaxis: Holding heparin GI prophylaxis: PPI Glucose control: SSI Mobility: BR Code Status: Full Family Communication: Updated at bedside today.  Disposition: ICU  Labs   CBC: Recent Labs  Lab 04/20/20 1555 04/20/20 1555 04/21/20 0320 04/21/20 0516 04/21/20 2015 04/21/20 2015 04/22/20 0408 04/22/20 0408 04/22/20 0455 04/22/20 2040 04/23/20 0404 05/23/2020 0315 May 23, 2020 0408  WBC 21.4*   < > 27.3*  --  33.5*  --  35.3*  --   --   --  37.7*  --  35.8*  NEUTROABS 19.7*  --  23.0*  --   --   --  30.8*  --   --   --  27.7*  --  30.9*  HGB 12.5   < > 12.8   < > 12.3   < > 11.9*   < > 15.0 15.0 11.8* 13.9 11.3*  HCT 45.2   < > 47.8*   < > 46.1*   < > 44.1   < > 44.0 44.0 43.5 41.0 41.6  MCV 113.3*   < > 114.9*  --  117.9*  --  116.4*  --   --   --  116.9*  --  114.3*  PLT 80*   < > 77*  --  53*  --  47*  --   --   --  33*  --  25*   < > = values in this interval not displayed.    Basic Metabolic Panel: Recent Labs  Lab 04/20/20 0442 04/20/20 0458 04/21/20 0320 04/21/20 0516 04/22/20 0408 04/22/20 0455 04/22/20 1611 04/22/20 2040 04/22/20 2042 04/23/20 0404 04/23/20 1800 05/23/20 0315 May 23, 2020 0408  NA 137   < > 136   < > 138   < > 139   < > 139 136 135 133* 137  K 4.5   < > 5.0   < > 4.8   < > 4.5   < > 4.9 5.0 4.9 4.5 5.1  CL 99   < > 98   < > 99   < > 101  --  99 98 97*  --  99  CO2 19*   < > 14*   < > 15*   < > 17*  --  15* 17* 17*  --  19*  GLUCOSE 71   < > 68*   < > 101*   < > 118*  --  103* 101* 227*  --  246*  BUN  51*   < > 36*   < > 25*   < > 20  --  19 17 14   --  13  CREATININE 3.15*   < > 2.72*   < > 2.33*   < > 1.89*  --  2.03* 1.89* 1.67*  --  1.68*  CALCIUM 8.5*   < > 9.2    < > 8.2*   < > 8.1*  --  8.1* 8.2* 7.9*  --  8.0*  MG 2.3  --  2.5*  --  2.4  --   --   --   --  2.7*  --   --  2.2  PHOS 6.1*   < > 6.5*   < > 5.4*  --  4.8*  --   --  5.2* 4.3  --  4.2   < > = values in this interval not displayed.   GFR: Estimated Creatinine Clearance: 45.7 mL/min (A) (by C-G formula based on SCr of 1.68 mg/dL (H)). Recent Labs  Lab 04/19/20 2032 04/20/20 0007 04/20/20 0442 04/20/20 1109 04/20/20 1555 04/20/20 1950 04/20/20 2236 04/21/20 0320 04/21/20 0320 04/21/20 2015 04/22/20 0408 04/22/20 1611 04/23/20 0404 05-17-20 0408  PROCALCITON 14.96  --  13.42  --   --   --   --  9.03  --   --   --   --   --   --   WBC  --    < >  --    < >   < >  --   --  27.3*   < > 33.5* 35.3*  --  37.7* 35.8*  LATICACIDVEN 8.3*   < >  --    < >  --  >11.0* >11.0* >11.0*  --   --   --  >11.0*  --   --    < > = values in this interval not displayed.    Liver Function Tests: Recent Labs  Lab 04/21/20 0320 04/21/20 1510 04/22/20 0408 04/22/20 0408 04/22/20 1611 04/22/20 2042 04/23/20 0404 04/23/20 1800 May 17, 2020 0408  AST 126*  --  2,164*  --   --  2,652* 2,832*  --  2,485*  ALT 66*  --  637*  --   --  961* 1,117*  --  1,141*  ALKPHOS 216*  --  239*  --   --  265* 296*  --  351*  BILITOT 12.2*  --  13.5*  --   --  15.7* 15.8*  --  17.3*  PROT 6.5  --  5.3*  --   --  4.9* 4.7*  --  4.4*  ALBUMIN 2.3*  2.2*   < > 1.7*  1.7*   < > 1.6* 1.6* 1.5*  1.6* 1.4* 1.4*  1.4*   < > = values in this interval not displayed.   No results for input(s): LIPASE, AMYLASE in the last 168 hours. Recent Labs  Lab 04/21/20 0320 04/23/20 1352  AMMONIA 43* 44*    ABG    Component Value Date/Time   PHART 7.285 (L) 2020-05-17 0315   PCO2ART 43.5 05/17/20 0315   PO2ART 223 (H) 05-17-2020 0315   HCO3 20.9 05/17/20 0315   TCO2 22 2020-05-17 0315   ACIDBASEDEF 6.0 (H) 05-17-20 0315   O2SAT 100.0 17-May-2020 0315     Coagulation Profile: Recent Labs  Lab 04/21/20 0320  04/21/20 2015 04/22/20 0408 04/23/20 0404 05/17/20 0408  INR 5.5* 11.3* 8.0* 5.9* 4.3*    Cardiac Enzymes:  No results for input(s): CKTOTAL, CKMB, CKMBINDEX, TROPONINI in the last 168 hours.  HbA1C: Hgb A1c MFr Bld  Date/Time Value Ref Range Status  02/15/2020 01:12 PM 7.5 (H) 4.8 - 5.6 % Final    Comment:    (NOTE) Pre diabetes:          5.7%-6.4% Diabetes:              >6.4% Glycemic control for   <7.0% adults with diabetes   07/04/2012 05:23 AM 6.4 (H) <5.7 % Final    Comment:    (NOTE)                                                                       According to the ADA Clinical Practice Recommendations for 2011, when HbA1c is used as a screening test:  >=6.5%   Diagnostic of Diabetes Mellitus           (if abnormal result is confirmed) 5.7-6.4%   Increased risk of developing Diabetes Mellitus References:Diagnosis and Classification of Diabetes Mellitus,Diabetes TGYB,6389,37(DSKAJ 1):S62-S69 and Standards of Medical Care in         Diabetes - 2011,Diabetes GOTL,5726,20 (Suppl 1):S11-S61.    CBG: Recent Labs  Lab 04/23/20 1510 04/23/20 1955 2020-05-22 0000 05/22/2020 0357 2020-05-22 0546  GLUCAP 118* 181* 165* 205* 203*    Dr. Jose Persia Internal Medicine PGY-1  Pager: [phone] 05/22/2020, 7:21 AM

## 2020-04-29 NOTE — Progress Notes (Signed)
Pt placement notified about taking body to morgue.

## 2020-04-29 NOTE — Progress Notes (Signed)
CRITICAL VALUE ALERT  Critical Value:  Platelets 25  Date & Time Notied:  19-May-2020 0449  Provider Notified: Warren Lacy  Orders Received/Actions taken: No new orders.

## 2020-04-29 NOTE — Discharge Summary (Signed)
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 Recently admited for A/C heart failue in Feb and March of 2021 due to fluid and dietary indiscretion.   Not fully recovered physically after recent admission. Reported SOB. Hemoptysis yesterday x1 and x2-3 today. Was about to go see her orthopedic doctor when she slumped to chair at home, move to floor and 911 was called received bystander CPR for several minutes; pulse represent so this was stopped for about 1 minute during which EMS arrive and found her pulseless and apneic. ACLS for about 5 min. EPI x5, BICARB x1, Defib x1. King airway in the field, intubated in ED. Suspected to be respiratory driven with COPD and suspected OSA, worsening RHF.   Hospital Course:  Patient was admitted and underwent targeted temperature control to limit brain injury.  Unfortunately she progressed into non-survivable multiorgan failure.  After multiple discussions with family, decision made to allow her to pass in peace.   Erskine Emery MD Noemi Chapel DO PCCM

## 2020-04-29 NOTE — Progress Notes (Signed)
Daily Progress Note   Patient Name: Tracy [name]       Date: 2020-05-10 DOB: Dec 11, 1961  Age: 59 y.o. MRN#: [id number] Attending Physician: Julian Hy, DO Primary Care Physician: Rory Percy, MD Admit Date: 04/05/2020  Reason for Consultation/Follow-up: Establishing goals of care  Subjective: Met twice with patient's daughter [name] at bedside. [name] acknowledges the need to transition to full comfort and withdraw ventilator, however, she is hesitant to say when. She is waiting for her brother to arrive- he is having difficulties with his care.  CRRT was discontinued due to filter clotting off- no plans to restart. Discussed further incremental transition with [name]- stopping IV fluids to prevent fluid overload, no further lab draws or blood sugar checks. Christie in agreement.  [name] expressed some feelings of being pressured to discontinue the ventilator. [name] is aware that her Mom is dying and does plan to eventually remove life support- but wishes for her brother to be here before making that final determination. She requests that it not be discussed further. Gave support and assured [name] that nothing would be done until she is ready.  Answered Christie's questions related to funeral planning.  Review of Systems  Unable to perform ROS: Intubated    Length of Stay: 6  Current Medications: Scheduled Meds:  . budesonide (PULMICORT) nebulizer solution  0.5 mg Nebulization BID  . chlorhexidine gluconate (MEDLINE KIT)  15 mL Mouth Rinse BID  . Chlorhexidine Gluconate Cloth  6 each Topical Daily  . hydrocortisone sod succinate (SOLU-CORTEF) inj  50 mg Intravenous Q6H  . ipratropium-albuterol  3 mL Nebulization Q6H  . mouth rinse  15 mL Mouth Rinse 10 times  per day  . pantoprazole (PROTONIX) IV  40 mg Intravenous QHS  . sodium chloride flush  10-40 mL Intracatheter Q12H    Continuous Infusions: .  prismasol BGK 4/2.5 500 mL/hr at 10-May-2020 0831  .  prismasol BGK 4/2.5 200 mL/hr at 04/23/20 1200  . sodium chloride Stopped (05-10-20 0402)  . amiodarone Stopped (04/23/20 1805)  . cefTRIAXone (ROCEPHIN)  IV Stopped (04/23/20 1639)  . dextrose Stopped (May 10, 2020 0402)  . feeding supplement (VITAL HIGH PROTEIN) Stopped (04/21/20 0759)  . fentaNYL infusion INTRAVENOUS Stopped (May 10, 2020 0741)  . norepinephrine (LEVOPHED) Adult infusion 40 mcg/min (2020-05-10 1100)  . prismasol BGK 4/2.5 1,800 mL/hr  at May 18, 2020 0930  . sodium bicarbonate 150 mEq in dextrose 5% 1000 mL 125 mL/hr at 2020/05/18 1100  . vasopressin (PITRESSIN) infusion - *FOR SHOCK* 0.03 Units/min (18-May-2020 1100)    PRN Meds: artificial tears, docusate sodium, polyethylene glycol, sodium chloride flush  Physical Exam Vitals and nursing note reviewed.  Constitutional:      Appearance: She is ill-appearing and toxic-appearing.  Pulmonary:     Comments: intubated Neurological:     Comments: nonresponsive             Vital Signs: BP 92/65   Pulse (!) 25   Temp (!) 96.8 F (36 C)   Resp 20   Ht '5\' 5"'$  (1.651 m)   Wt 115.2 kg   SpO2 (!) 84%   BMI 42.26 kg/m  SpO2: SpO2: (!) 84 % O2 Device: O2 Device: Ventilator O2 Flow Rate:    Intake/output summary:   Intake/Output Summary (Last 24 hours) at 18-May-2020 1303 Last data filed at 2020-05-18 1200 Gross per 24 hour  Intake 5264.06 ml  Output 55 ml  Net 5209.06 ml   LBM: Last BM Date: (PTA) Baseline Weight: Weight: 110 kg Most recent weight: Weight: 115.2 kg       Palliative Assessment/Data: PPS: 10%      Patient Active Problem List   Diagnosis Date Noted  . Goals of care, counseling/discussion   . DNR (do not resuscitate) discussion   . Palliative care by specialist   . CRF (chronic renal failure), stage 4  (severe) ([place])   . Cardiac arrest ([place]) 04/13/2020  . Acute metabolic encephalopathy 00/76/2263  . Acute on chronic respiratory failure with hypoxia () 03/13/2020  . Sepsis due to Escherichia coli (E. coli) ([place]) 03/09/2020  . Acute cystitis with hematuria   . Elevated LFTs   . CKD (chronic kidney disease) stage 4, GFR 15-29 ml/min (H[facility]) 03/08/2020  . Anasarca   . Poor intravenous access   . Acute on chronic diastolic CHF (congestive heart failure) ([place]) 03/07/2020  . Acute on chronic right-sided heart failure ([place]) 03/07/2020  . Chronic respiratory failure with hypoxia ([place]) 03/07/2020  . Acute UTI 03/07/2020  . Paroxysmal A-fib ([place]) 02/16/2020  . Acute exacerbation of CHF (congestive heart failure) ([place]) 02/15/2020  . Acute respiratory failure with hypoxia ([place]) 02/15/2020  . Acute on chronic heart failure with preserved ejection fraction (HFpEF) ([place]) 02/15/2020  . H/O: CVA (cerebrovascular accident)/ 2013 02/15/2020  . Diabetes mellitus type 2, uncontrolled ([place]) 02/15/2020  . Rt DVT (deep venous thrombosis) -2014 02/15/2020  . Anticoagulated on Coumadin 02/15/2020  . Right bundle branch block 09/05/2017  . Long term current use of anticoagulant therapy 03/06/2017  . Body mass index (BMI) of 40.0-44.9 in adult ([place]) 02/20/2017  . Personal history of venous thrombosis and embolism 02/20/2017  . Umbilical hernia 33/54/5625  . Phlebitis and thrombophlebitis of lower extremities 01/13/2017  . Left knee pain 03/05/2016  . Influenza with respiratory manifestations 03/22/2015  . COPD exacerbation ([place]) 03/21/2015  . CAP (community acquired pneumonia) 07/04/2013  . Sepsis ([place]) 07/04/2013  . AKI (acute kidney injury) ([place]) 07/04/2013  . Left thyroid nodule 07/04/2013  . Essential hypertension 07/04/2013  . Diabetes ([place]) 07/04/2013  . Acute respiratory failure ([place]) 07/04/2013  . Hemiparesis, acute ([place]) 07/04/2012  . LVH (left ventricular hypertrophy) 07/04/2012  . Acute  ischemic stroke ([place]) 07/03/2012  . Hypertensive urgency 07/03/2012  . Tobacco abuse 07/03/2012  . Noncompliance 07/03/2012  . Thrombocytopenia ([place]) 07/03/2012  . 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 coli bacteremia.Ongoing treatment for septic and cardiogenic shock and s/p hypothermia protocol. Concern for poor neurological status. Palliative medicine consulted for goals of care.  Assessment/Recommendations/Plan   Patient is dying- CRRT stopped, lactic acid >11  Christie agrees to incremental transition to comfort measures- stop CRRT, stop labs, CBGs, stop IV fluids- she is not ready to stop other IV medications or to withdraw from ventilator- she is needing time to process this and is waiting for her brother to arrive at bedside- [name] is aware that patient could die in the interim despite everything we are doing now- it almost seems this would be her preference  Recommend no further discussion regarding transition to comfort care until patient's son arrives- PMT will followup with family tomorrow if not able to today  Goals of Care and Additional Recommendations:  Limitations on Scope of Treatment: Minimize Medications, No Diagnostics, No Glucose Monitoring, No Hemodialysis and No IV Fluids  Code Status:  DNR  Prognosis:   Hours - Days  Discharge Planning:  Anticipated Hospital Death  Care plan was discussed with patient's daughter and nurse.   Thank you for allowing the Palliative Medicine Team to assist in the care of this patient.   Time In: 1355 Time Out: 1430 Total Time 35 mins Prolonged Time Billed no      Greater than 50%  of  this time was spent counseling and coordinating care related to the above assessment and plan.  Mariana Kaufman, AGNP-C Palliative Medicine   Please contact Palliative Medicine Team phone at [phone] for questions and concerns.

## 2020-04-29 NOTE — Progress Notes (Signed)
Repeat CBG 203, paged ELink per request. No new orders.

## 2020-04-29 NOTE — Progress Notes (Signed)
Wasted 158ml of fentanyl with Production assistant, radio

## 2020-04-29 NOTE — Progress Notes (Signed)
Elink made aware of CBG 205. D10 turned off and will recheck CBG in an hour.

## 2020-04-29 NOTE — Progress Notes (Signed)
Nutrition Brief Note  Chart reviewed.  Pt now with incremental transition to comfort care. Noted TF discontinued.  No further nutrition interventions warranted at this time.  Please re-consult as needed.   Kerman Passey MS, RDN, LDN, CNSC RD Pager Number and RD On-Call Pager Number Located in [place]

## 2020-04-29 NOTE — Progress Notes (Signed)
Pt extubated to comfort care per physician order/family request. Pt suctioned via ETT and orally prior. Family remained at pt bedside. RT available if needed.

## 2020-04-29 DEATH — deceased

## 2020-05-01 ENCOUNTER — Ambulatory Visit: Payer: Medicare Other | Admitting: Cardiovascular Disease

## 2020-05-01 ENCOUNTER — Encounter (HOSPITAL_COMMUNITY): Payer: Medicare Other | Admitting: Physical Therapy

## 2020-05-03 ENCOUNTER — Encounter (HOSPITAL_COMMUNITY): Payer: Medicare Other | Admitting: Physical Therapy

## 2020-05-05 ENCOUNTER — Encounter (HOSPITAL_COMMUNITY): Payer: Medicare Other | Admitting: Physical Therapy

## 2020-05-08 ENCOUNTER — Encounter (HOSPITAL_COMMUNITY): Payer: Medicare Other | Admitting: Physical Therapy

## 2020-05-10 ENCOUNTER — Encounter (HOSPITAL_COMMUNITY): Payer: Medicare Other | Admitting: Physical Therapy

## 2020-05-12 ENCOUNTER — Encounter (HOSPITAL_COMMUNITY): Payer: Medicare Other | Admitting: Physical Therapy

## 2020-05-15 ENCOUNTER — Encounter (HOSPITAL_COMMUNITY): Payer: Medicare Other | Admitting: Physical Therapy

## 2020-05-17 ENCOUNTER — Encounter (HOSPITAL_COMMUNITY): Payer: Medicare Other | Admitting: Physical Therapy

## 2020-05-19 ENCOUNTER — Encounter (HOSPITAL_COMMUNITY): Payer: Medicare Other | Admitting: Physical Therapy

## 2020-05-22 ENCOUNTER — Encounter (HOSPITAL_COMMUNITY): Payer: Medicare Other | Admitting: Physical Therapy

## 2020-05-24 ENCOUNTER — Encounter (HOSPITAL_COMMUNITY): Payer: Medicare Other | Admitting: Physical Therapy

## 2020-05-26 ENCOUNTER — Encounter (HOSPITAL_COMMUNITY): Payer: Medicare Other | Admitting: Physical Therapy

## 2023-03-20 ENCOUNTER — Other Ambulatory Visit: Payer: Self-pay | Admitting: Physician Assistant
